# Patient Record
Sex: Female | Born: 1975 | Race: White | Hispanic: No | Marital: Single | Smoking: Former smoker
Health system: Southern US, Community
[De-identification: ages and names within clinical notes are randomized; demographics above are authoritative.]

## PROBLEM LIST (undated history)

## (undated) DIAGNOSIS — Z0189 Encounter for other specified special examinations: Secondary | ICD-10-CM

## (undated) DIAGNOSIS — D649 Anemia, unspecified: Secondary | ICD-10-CM

## (undated) DIAGNOSIS — R0902 Hypoxemia: Secondary | ICD-10-CM

## (undated) DIAGNOSIS — F329 Major depressive disorder, single episode, unspecified: Secondary | ICD-10-CM

## (undated) DIAGNOSIS — J449 Chronic obstructive pulmonary disease, unspecified: Secondary | ICD-10-CM

## (undated) DIAGNOSIS — Z789 Other specified health status: Secondary | ICD-10-CM

## (undated) DIAGNOSIS — Z720 Tobacco use: Secondary | ICD-10-CM

## (undated) DIAGNOSIS — F1010 Alcohol abuse, uncomplicated: Secondary | ICD-10-CM

## (undated) DIAGNOSIS — F32A Depression, unspecified: Secondary | ICD-10-CM

## (undated) HISTORY — PX: BACK SURGERY: SHX140

## (undated) HISTORY — PX: DILATION AND CURETTAGE OF UTERUS: SHX78

## (undated) HISTORY — PX: ANKLE GANGLION CYST EXCISION: SHX1148

---

## 1997-10-06 ENCOUNTER — Emergency Department (HOSPITAL_COMMUNITY): Admission: EM | Admit: 1997-10-06 | Discharge: 1997-10-06 | Payer: Self-pay | Admitting: Emergency Medicine

## 1998-06-13 ENCOUNTER — Encounter: Payer: Self-pay | Admitting: Emergency Medicine

## 1998-06-13 ENCOUNTER — Inpatient Hospital Stay (HOSPITAL_COMMUNITY): Admission: EM | Admit: 1998-06-13 | Discharge: 1998-06-14 | Payer: Self-pay | Admitting: Emergency Medicine

## 1998-11-10 ENCOUNTER — Emergency Department (HOSPITAL_COMMUNITY): Admission: EM | Admit: 1998-11-10 | Discharge: 1998-11-11 | Payer: Self-pay

## 1998-11-11 ENCOUNTER — Inpatient Hospital Stay (HOSPITAL_COMMUNITY): Admission: EM | Admit: 1998-11-11 | Discharge: 1998-11-13 | Payer: Self-pay | Admitting: Emergency Medicine

## 1999-01-14 ENCOUNTER — Encounter: Payer: Self-pay | Admitting: Neurosurgery

## 1999-01-14 ENCOUNTER — Encounter: Payer: Self-pay | Admitting: *Deleted

## 1999-01-14 ENCOUNTER — Inpatient Hospital Stay (HOSPITAL_COMMUNITY): Admission: EM | Admit: 1999-01-14 | Discharge: 1999-01-15 | Payer: Self-pay | Admitting: Neurosurgery

## 1999-06-09 ENCOUNTER — Inpatient Hospital Stay (HOSPITAL_COMMUNITY): Admission: AD | Admit: 1999-06-09 | Discharge: 1999-06-11 | Payer: Self-pay | Admitting: Family Medicine

## 1999-06-10 ENCOUNTER — Encounter: Payer: Self-pay | Admitting: Internal Medicine

## 1999-08-18 ENCOUNTER — Emergency Department (HOSPITAL_COMMUNITY): Admission: EM | Admit: 1999-08-18 | Discharge: 1999-08-18 | Payer: Self-pay | Admitting: Emergency Medicine

## 1999-11-27 ENCOUNTER — Emergency Department (HOSPITAL_COMMUNITY): Admission: EM | Admit: 1999-11-27 | Discharge: 1999-11-27 | Payer: Self-pay | Admitting: *Deleted

## 2000-02-23 ENCOUNTER — Emergency Department (HOSPITAL_COMMUNITY): Admission: EM | Admit: 2000-02-23 | Discharge: 2000-02-23 | Payer: Self-pay | Admitting: Emergency Medicine

## 2000-03-14 ENCOUNTER — Inpatient Hospital Stay (HOSPITAL_COMMUNITY): Admission: EM | Admit: 2000-03-14 | Discharge: 2000-03-15 | Payer: Self-pay | Admitting: Emergency Medicine

## 2000-05-21 ENCOUNTER — Emergency Department (HOSPITAL_COMMUNITY): Admission: EM | Admit: 2000-05-21 | Discharge: 2000-05-21 | Payer: Self-pay | Admitting: Emergency Medicine

## 2000-05-21 ENCOUNTER — Encounter: Payer: Self-pay | Admitting: Emergency Medicine

## 2000-07-26 ENCOUNTER — Encounter: Payer: Self-pay | Admitting: Emergency Medicine

## 2000-07-26 ENCOUNTER — Emergency Department (HOSPITAL_COMMUNITY): Admission: EM | Admit: 2000-07-26 | Discharge: 2000-07-26 | Payer: Self-pay | Admitting: Emergency Medicine

## 2000-08-22 ENCOUNTER — Encounter: Payer: Self-pay | Admitting: Emergency Medicine

## 2000-08-22 ENCOUNTER — Emergency Department (HOSPITAL_COMMUNITY): Admission: EM | Admit: 2000-08-22 | Discharge: 2000-08-22 | Payer: Self-pay | Admitting: *Deleted

## 2000-09-25 ENCOUNTER — Encounter: Payer: Self-pay | Admitting: Emergency Medicine

## 2000-09-25 ENCOUNTER — Inpatient Hospital Stay (HOSPITAL_COMMUNITY): Admission: EM | Admit: 2000-09-25 | Discharge: 2000-09-26 | Payer: Self-pay

## 2000-10-23 ENCOUNTER — Encounter: Admission: RE | Admit: 2000-10-23 | Discharge: 2000-10-23 | Payer: Self-pay | Admitting: Family Medicine

## 2001-02-13 ENCOUNTER — Emergency Department (HOSPITAL_COMMUNITY): Admission: EM | Admit: 2001-02-13 | Discharge: 2001-02-13 | Payer: Self-pay | Admitting: Emergency Medicine

## 2001-07-09 ENCOUNTER — Emergency Department (HOSPITAL_COMMUNITY): Admission: EM | Admit: 2001-07-09 | Discharge: 2001-07-09 | Payer: Self-pay | Admitting: Emergency Medicine

## 2002-04-03 ENCOUNTER — Inpatient Hospital Stay (HOSPITAL_COMMUNITY): Admission: AD | Admit: 2002-04-03 | Discharge: 2002-04-03 | Payer: Self-pay | Admitting: *Deleted

## 2002-06-01 ENCOUNTER — Emergency Department (HOSPITAL_COMMUNITY): Admission: EM | Admit: 2002-06-01 | Discharge: 2002-06-01 | Payer: Self-pay | Admitting: Emergency Medicine

## 2002-10-21 ENCOUNTER — Encounter: Payer: Self-pay | Admitting: Emergency Medicine

## 2002-10-21 ENCOUNTER — Emergency Department (HOSPITAL_COMMUNITY): Admission: EM | Admit: 2002-10-21 | Discharge: 2002-10-21 | Payer: Self-pay | Admitting: Emergency Medicine

## 2002-10-24 ENCOUNTER — Inpatient Hospital Stay (HOSPITAL_COMMUNITY): Admission: EM | Admit: 2002-10-24 | Discharge: 2002-10-25 | Payer: Self-pay | Admitting: Emergency Medicine

## 2003-01-09 ENCOUNTER — Ambulatory Visit (HOSPITAL_COMMUNITY): Admission: AD | Admit: 2003-01-09 | Discharge: 2003-01-09 | Payer: Self-pay | Admitting: Obstetrics & Gynecology

## 2003-01-09 ENCOUNTER — Encounter (INDEPENDENT_AMBULATORY_CARE_PROVIDER_SITE_OTHER): Payer: Self-pay

## 2003-01-13 ENCOUNTER — Other Ambulatory Visit: Admission: RE | Admit: 2003-01-13 | Discharge: 2003-01-13 | Payer: Self-pay | Admitting: Obstetrics and Gynecology

## 2003-02-22 ENCOUNTER — Emergency Department (HOSPITAL_COMMUNITY): Admission: EM | Admit: 2003-02-22 | Discharge: 2003-02-22 | Payer: Self-pay | Admitting: Emergency Medicine

## 2003-03-27 ENCOUNTER — Inpatient Hospital Stay (HOSPITAL_COMMUNITY): Admission: EM | Admit: 2003-03-27 | Discharge: 2003-03-30 | Payer: Self-pay | Admitting: Internal Medicine

## 2003-12-18 ENCOUNTER — Emergency Department (HOSPITAL_COMMUNITY): Admission: EM | Admit: 2003-12-18 | Discharge: 2003-12-18 | Payer: Self-pay | Admitting: Emergency Medicine

## 2004-01-22 ENCOUNTER — Emergency Department (HOSPITAL_COMMUNITY): Admission: EM | Admit: 2004-01-22 | Discharge: 2004-01-22 | Payer: Self-pay | Admitting: Emergency Medicine

## 2004-03-22 ENCOUNTER — Emergency Department (HOSPITAL_COMMUNITY): Admission: EM | Admit: 2004-03-22 | Discharge: 2004-03-22 | Payer: Self-pay | Admitting: Emergency Medicine

## 2004-04-21 ENCOUNTER — Inpatient Hospital Stay (HOSPITAL_COMMUNITY): Admission: EM | Admit: 2004-04-21 | Discharge: 2004-04-23 | Payer: Self-pay | Admitting: *Deleted

## 2004-09-15 ENCOUNTER — Observation Stay (HOSPITAL_COMMUNITY): Admission: EM | Admit: 2004-09-15 | Discharge: 2004-09-16 | Payer: Self-pay | Admitting: Emergency Medicine

## 2004-11-24 ENCOUNTER — Emergency Department (HOSPITAL_COMMUNITY): Admission: EM | Admit: 2004-11-24 | Discharge: 2004-11-24 | Payer: Self-pay | Admitting: Emergency Medicine

## 2004-12-01 ENCOUNTER — Emergency Department (HOSPITAL_COMMUNITY): Admission: EM | Admit: 2004-12-01 | Discharge: 2004-12-02 | Payer: Self-pay | Admitting: Emergency Medicine

## 2004-12-05 ENCOUNTER — Ambulatory Visit (HOSPITAL_COMMUNITY): Admission: RE | Admit: 2004-12-05 | Discharge: 2004-12-05 | Payer: Self-pay | Admitting: Family Medicine

## 2005-06-07 ENCOUNTER — Emergency Department (HOSPITAL_COMMUNITY): Admission: EM | Admit: 2005-06-07 | Discharge: 2005-06-07 | Payer: Self-pay | Admitting: Emergency Medicine

## 2005-06-28 ENCOUNTER — Emergency Department (HOSPITAL_COMMUNITY): Admission: EM | Admit: 2005-06-28 | Discharge: 2005-06-28 | Payer: Self-pay | Admitting: Family Medicine

## 2005-06-29 ENCOUNTER — Observation Stay (HOSPITAL_COMMUNITY): Admission: EM | Admit: 2005-06-29 | Discharge: 2005-06-30 | Payer: Self-pay | Admitting: Emergency Medicine

## 2005-06-29 ENCOUNTER — Ambulatory Visit: Payer: Self-pay | Admitting: Orthopedic Surgery

## 2005-07-04 ENCOUNTER — Encounter: Admission: RE | Admit: 2005-07-04 | Discharge: 2005-07-04 | Payer: Self-pay | Admitting: Family Medicine

## 2005-09-28 ENCOUNTER — Emergency Department (HOSPITAL_COMMUNITY): Admission: EM | Admit: 2005-09-28 | Discharge: 2005-09-28 | Payer: Self-pay | Admitting: Family Medicine

## 2006-03-18 ENCOUNTER — Emergency Department (HOSPITAL_COMMUNITY): Admission: EM | Admit: 2006-03-18 | Discharge: 2006-03-18 | Payer: Self-pay | Admitting: Emergency Medicine

## 2006-05-15 ENCOUNTER — Emergency Department (HOSPITAL_COMMUNITY): Admission: EM | Admit: 2006-05-15 | Discharge: 2006-05-15 | Payer: Self-pay | Admitting: Emergency Medicine

## 2006-12-03 ENCOUNTER — Emergency Department (HOSPITAL_COMMUNITY): Admission: EM | Admit: 2006-12-03 | Discharge: 2006-12-03 | Payer: Self-pay | Admitting: Emergency Medicine

## 2006-12-16 ENCOUNTER — Inpatient Hospital Stay (HOSPITAL_COMMUNITY): Admission: EM | Admit: 2006-12-16 | Discharge: 2006-12-19 | Payer: Self-pay | Admitting: Emergency Medicine

## 2007-01-18 IMAGING — CR DG CHEST 2V
2 series · 2 of 2 positions shown · non-contrast
Comparison: none

CLINICAL DATA: Cough and congestion

Chest 2 view:
Comparison 09/15/2004. Some increase in interstitial opacities throughout both
lower lobes and perihilar regions, with sparing of the upper lobes. No effusion.
Heart size remains normal. Visualized bones unremarkable.

[view not recorded (1 of 2)]
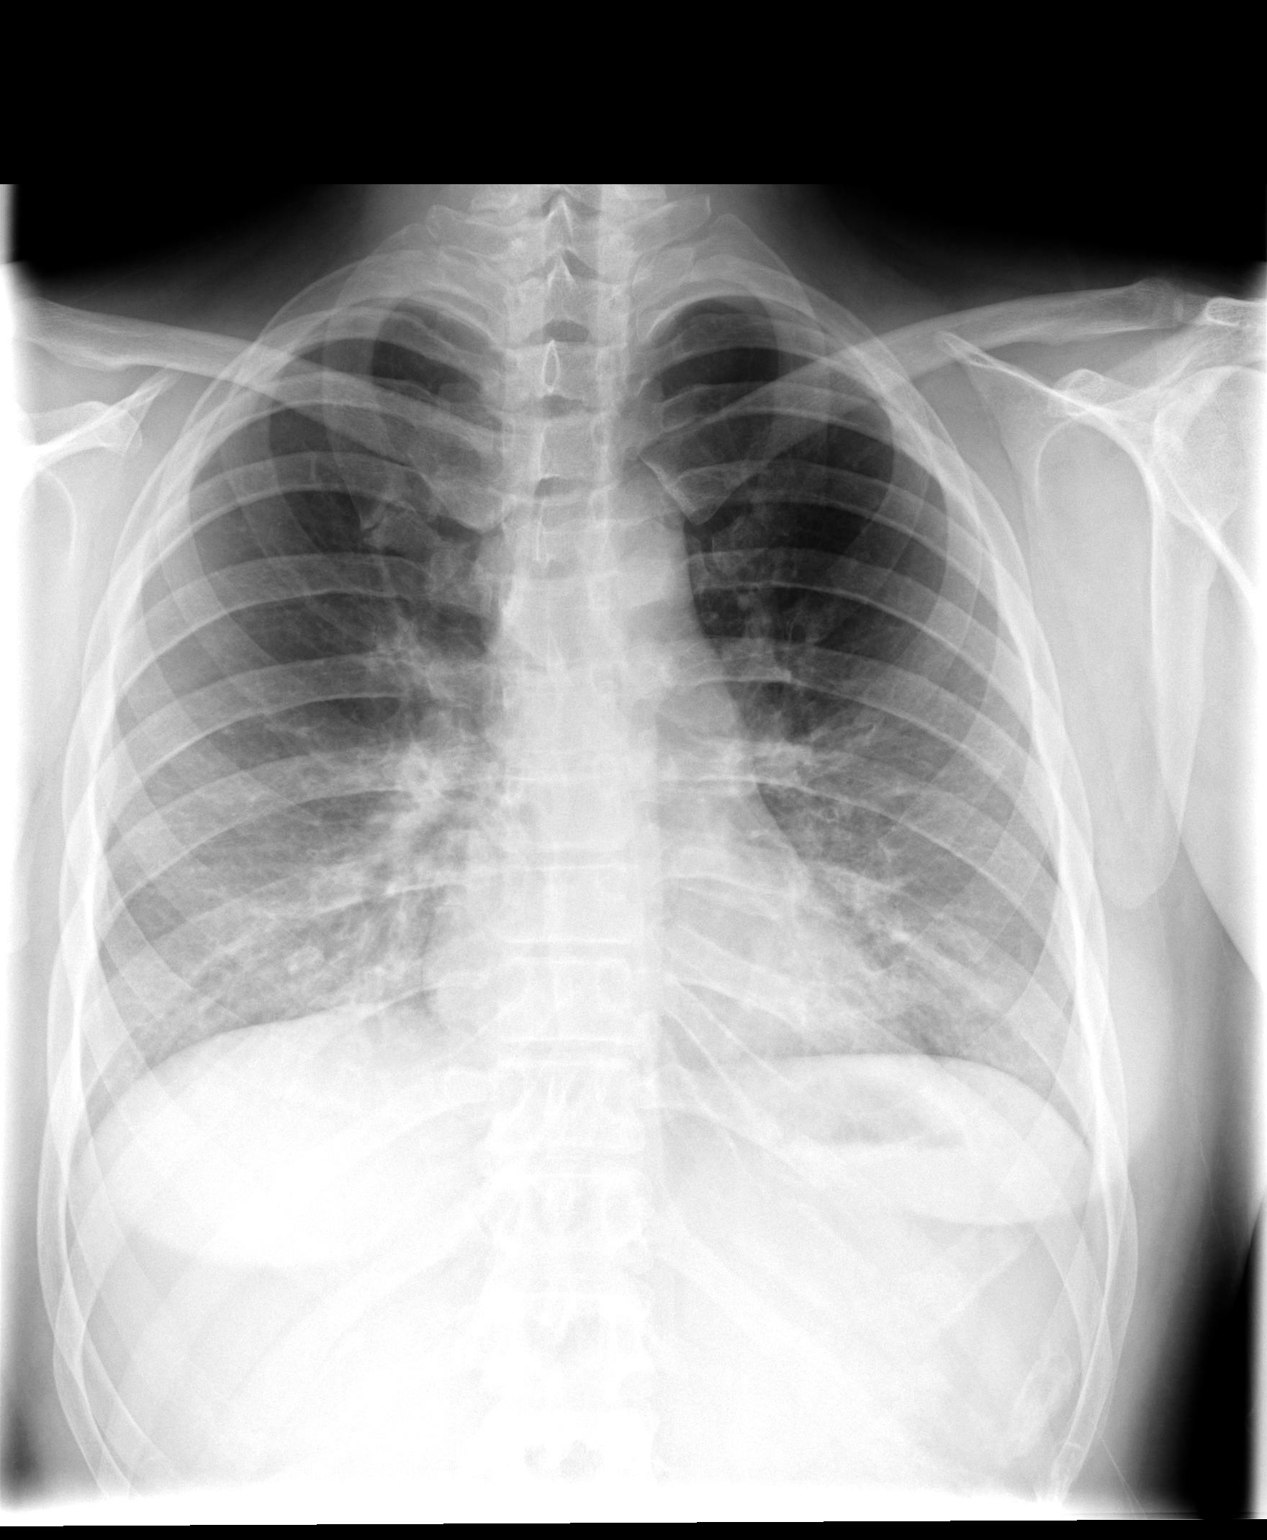

[view not recorded (2 of 2)]
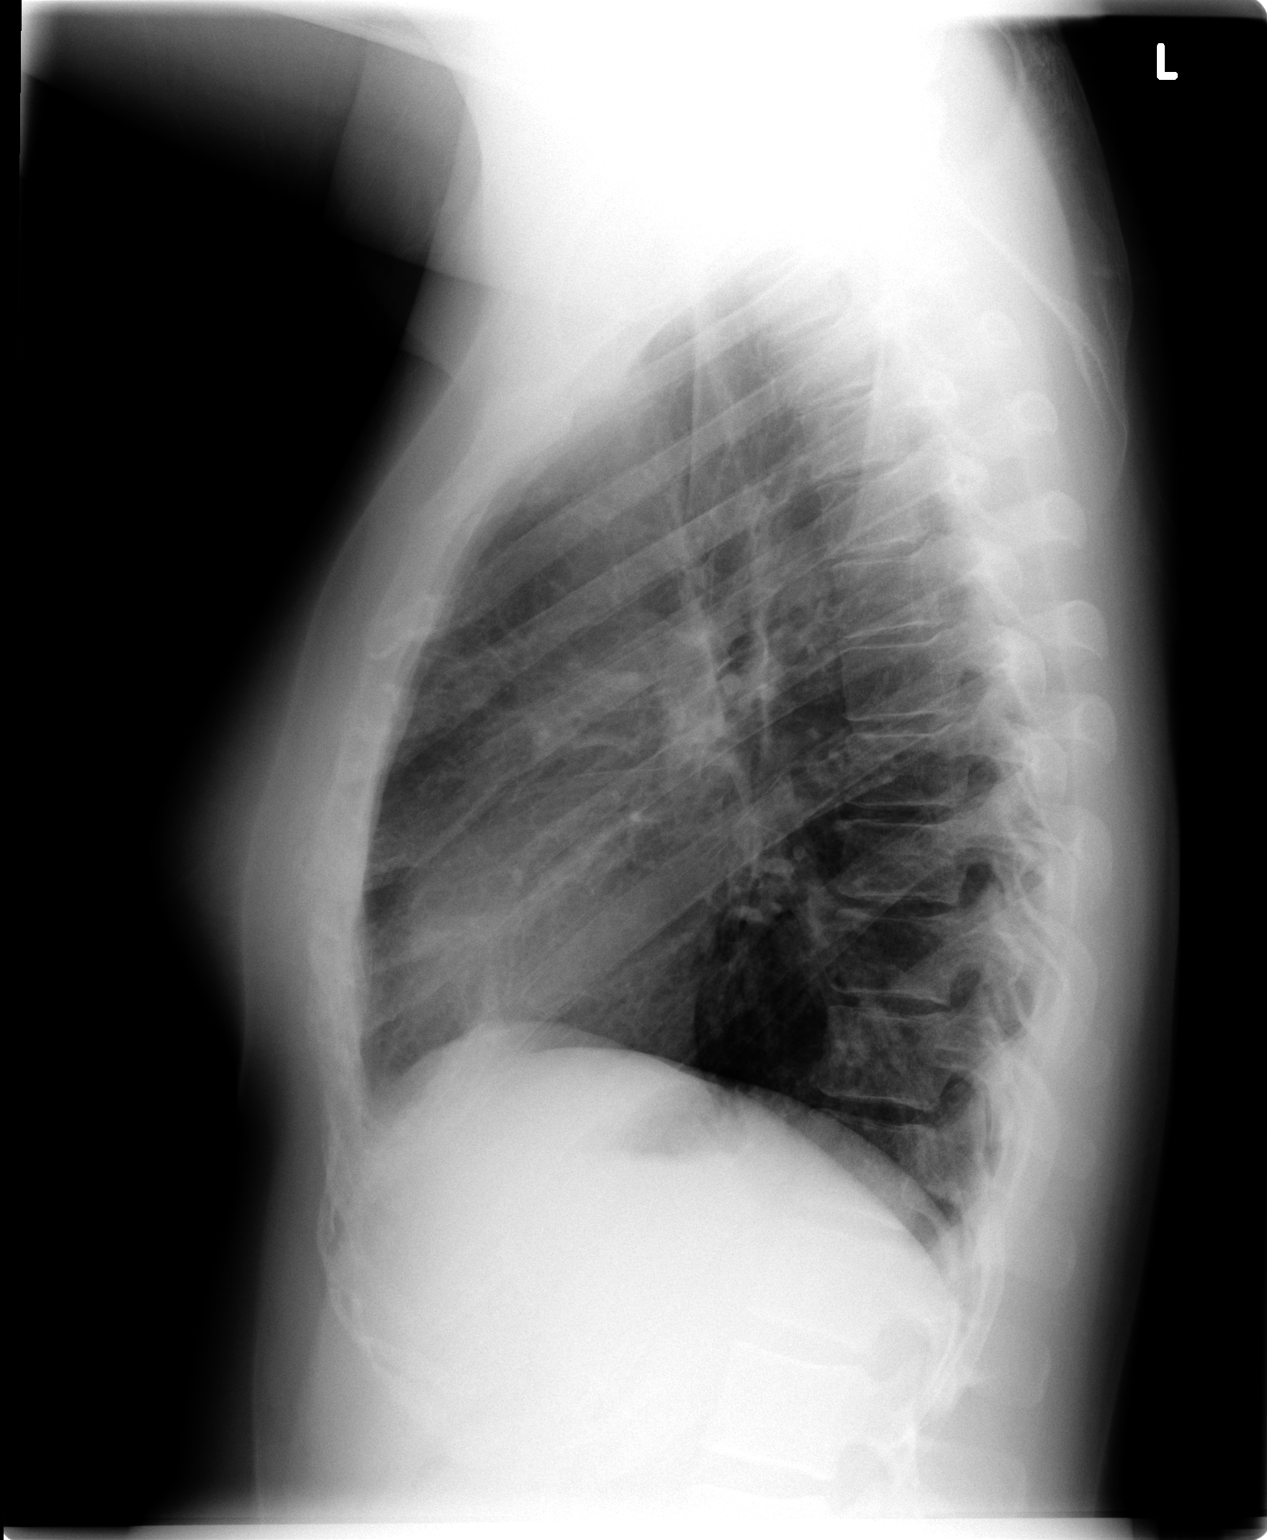

[2 of 2 positions shown; findings below may reference images not displayed]

IMPRESSION: 1. Interval worsening of perihilar and bibasilar infiltrates or atelectasis

## 2007-01-21 ENCOUNTER — Emergency Department (HOSPITAL_COMMUNITY): Admission: EM | Admit: 2007-01-21 | Discharge: 2007-01-21 | Payer: Self-pay | Admitting: Emergency Medicine

## 2007-04-11 ENCOUNTER — Emergency Department (HOSPITAL_COMMUNITY): Admission: EM | Admit: 2007-04-11 | Discharge: 2007-04-12 | Payer: Self-pay | Admitting: Emergency Medicine

## 2007-05-10 ENCOUNTER — Emergency Department (HOSPITAL_COMMUNITY): Admission: EM | Admit: 2007-05-10 | Discharge: 2007-05-10 | Payer: Self-pay | Admitting: Emergency Medicine

## 2007-05-27 ENCOUNTER — Inpatient Hospital Stay (HOSPITAL_COMMUNITY): Admission: EM | Admit: 2007-05-27 | Discharge: 2007-05-30 | Payer: Self-pay | Admitting: Emergency Medicine

## 2007-11-22 ENCOUNTER — Emergency Department (HOSPITAL_COMMUNITY): Admission: EM | Admit: 2007-11-22 | Discharge: 2007-11-22 | Payer: Self-pay | Admitting: Family Medicine

## 2007-11-24 ENCOUNTER — Emergency Department (HOSPITAL_COMMUNITY): Admission: EM | Admit: 2007-11-24 | Discharge: 2007-11-25 | Payer: Self-pay | Admitting: Emergency Medicine

## 2008-11-16 ENCOUNTER — Emergency Department (HOSPITAL_COMMUNITY): Admission: EM | Admit: 2008-11-16 | Discharge: 2008-11-16 | Payer: Self-pay | Admitting: Emergency Medicine

## 2009-02-22 ENCOUNTER — Emergency Department (HOSPITAL_COMMUNITY): Admission: EM | Admit: 2009-02-22 | Discharge: 2009-02-22 | Payer: Self-pay | Admitting: Emergency Medicine

## 2009-05-11 ENCOUNTER — Emergency Department (HOSPITAL_COMMUNITY): Admission: EM | Admit: 2009-05-11 | Discharge: 2009-05-11 | Payer: Self-pay | Admitting: Emergency Medicine

## 2009-07-10 ENCOUNTER — Inpatient Hospital Stay (HOSPITAL_COMMUNITY): Admission: EM | Admit: 2009-07-10 | Discharge: 2009-07-12 | Payer: Self-pay | Admitting: Emergency Medicine

## 2009-11-01 ENCOUNTER — Emergency Department (HOSPITAL_COMMUNITY): Admission: EM | Admit: 2009-11-01 | Discharge: 2009-11-01 | Payer: Self-pay | Admitting: Emergency Medicine

## 2009-11-25 ENCOUNTER — Emergency Department (HOSPITAL_COMMUNITY): Admission: EM | Admit: 2009-11-25 | Discharge: 2009-11-25 | Payer: Self-pay | Admitting: Emergency Medicine

## 2010-01-07 ENCOUNTER — Inpatient Hospital Stay (HOSPITAL_COMMUNITY): Admission: EM | Admit: 2010-01-07 | Discharge: 2010-01-09 | Payer: Self-pay | Admitting: Emergency Medicine

## 2010-01-20 ENCOUNTER — Emergency Department (HOSPITAL_COMMUNITY): Admission: EM | Admit: 2010-01-20 | Discharge: 2009-06-24 | Payer: Self-pay | Admitting: Emergency Medicine

## 2010-03-06 ENCOUNTER — Encounter: Payer: Self-pay | Admitting: Family Medicine

## 2010-04-26 LAB — DIFFERENTIAL
Basophils Absolute: 0 10*3/uL (ref 0.0–0.1)
Basophils Absolute: 0 10*3/uL (ref 0.0–0.1)
Basophils Relative: 0 % (ref 0–1)
Eosinophils Relative: 4 % (ref 0–5)
Lymphocytes Relative: 21 % (ref 12–46)
Lymphs Abs: 1.7 10*3/uL (ref 0.7–4.0)
Monocytes Absolute: 0.3 10*3/uL (ref 0.1–1.0)
Monocytes Absolute: 0.8 10*3/uL (ref 0.1–1.0)

## 2010-04-26 LAB — CBC
HCT: 32.9 % — ABNORMAL LOW (ref 36.0–46.0)
MCH: 24.5 pg — ABNORMAL LOW (ref 26.0–34.0)
MCHC: 30.8 g/dL (ref 30.0–36.0)
MCV: 77.6 fL — ABNORMAL LOW (ref 78.0–100.0)
Platelets: 253 10*3/uL (ref 150–400)
RBC: 4.07 MIL/uL (ref 3.87–5.11)
WBC: 8 10*3/uL (ref 4.0–10.5)

## 2010-04-26 LAB — ETHANOL: Alcohol, Ethyl (B): <5 mg/dL (ref 0–10)

## 2010-04-26 LAB — RAPID URINE DRUG SCREEN, HOSP PERFORMED
Amphetamines: NOT DETECTED
Barbiturates: NOT DETECTED
Benzodiazepines: NOT DETECTED
Opiates: POSITIVE — AB
Tetrahydrocannabinol: NOT DETECTED

## 2010-04-26 LAB — CULTURE, BLOOD (ROUTINE X 2)
Culture: NO GROWTH
Culture: NO GROWTH

## 2010-04-26 LAB — BASIC METABOLIC PANEL
CO2: 22 mEq/L (ref 19–32)
CO2: 23 mEq/L (ref 19–32)
Calcium: 9 mg/dL (ref 8.4–10.5)
Chloride: 109 mEq/L (ref 96–112)
GFR calc Af Amer: >60 mL/min (ref >60)
Glucose, Bld: 142 mg/dL — ABNORMAL HIGH (ref 70–99)
Sodium: 137 mEq/L (ref 135–145)

## 2010-04-26 LAB — BLOOD GAS, ARTERIAL
O2 Content: 2 L/min
O2 Saturation: 96.2 %
pO2, Arterial: 75.8 mmHg — ABNORMAL LOW (ref 80.0–100.0)

## 2010-04-26 LAB — LACTIC ACID, PLASMA: Lactic Acid, Venous: 2.2 mmol/L (ref 0.5–2.2)

## 2010-04-30 LAB — CBC
HCT: 34.7 % — ABNORMAL LOW (ref 36.0–46.0)
Hemoglobin: 11.4 g/dL — ABNORMAL LOW (ref 12.0–15.0)
MCV: 79.4 fL (ref 78.0–100.0)
Platelets: 288 10*3/uL (ref 150–400)
RDW: 16.2 % — ABNORMAL HIGH (ref 11.5–15.5)
WBC: 7.8 10*3/uL (ref 4.0–10.5)

## 2010-04-30 LAB — BASIC METABOLIC PANEL
BUN: 8 mg/dL (ref 6–23)
Chloride: 104 mEq/L (ref 96–112)
Creatinine, Ser: 0.68 mg/dL (ref 0.4–1.2)
GFR calc Af Amer: >60 mL/min (ref >60)
Glucose, Bld: 90 mg/dL (ref 70–99)
Potassium: 3.9 mEq/L (ref 3.5–5.1)
Sodium: 136 mEq/L (ref 135–145)

## 2010-04-30 LAB — ETHANOL: Alcohol, Ethyl (B): <5 mg/dL (ref 0–10)

## 2010-04-30 LAB — DIFFERENTIAL
Eosinophils Absolute: 0.2 10*3/uL (ref 0.0–0.7)
Eosinophils Relative: 3 % (ref 0–5)
Lymphs Abs: 3 10*3/uL (ref 0.7–4.0)
Monocytes Absolute: 0.6 10*3/uL (ref 0.1–1.0)

## 2010-04-30 LAB — RAPID URINE DRUG SCREEN, HOSP PERFORMED
Amphetamines: NOT DETECTED
Barbiturates: NOT DETECTED
Benzodiazepines: NOT DETECTED
Tetrahydrocannabinol: POSITIVE — AB

## 2010-04-30 LAB — POCT PREGNANCY, URINE: Preg Test, Ur: NEGATIVE

## 2010-05-02 LAB — IRON AND TIBC
Iron: 12 ug/dL — ABNORMAL LOW (ref 42–135)
Saturation Ratios: 3 % — ABNORMAL LOW (ref 20–55)
TIBC: 458 ug/dL (ref 250–470)

## 2010-05-02 LAB — RAPID URINE DRUG SCREEN, HOSP PERFORMED
Benzodiazepines: NOT DETECTED
Cocaine: NOT DETECTED
Opiates: NOT DETECTED
Tetrahydrocannabinol: NOT DETECTED

## 2010-05-02 LAB — POCT I-STAT, CHEM 8
BUN: 9 mg/dL (ref 6–23)
Chloride: 110 mEq/L (ref 96–112)
Creatinine, Ser: 0.6 mg/dL (ref 0.4–1.2)
Glucose, Bld: 89 mg/dL (ref 70–99)
Hemoglobin: 12.2 g/dL (ref 12.0–15.0)
Potassium: 3.9 mEq/L (ref 3.5–5.1)
Sodium: 142 mEq/L (ref 135–145)

## 2010-05-02 LAB — RETICULOCYTES: Retic Count, Absolute: 65.7 10*3/uL (ref 19.0–186.0)

## 2010-05-02 LAB — CBC
MCHC: 32.5 g/dL (ref 30.0–36.0)
MCV: 77 fL — ABNORMAL LOW (ref 78.0–100.0)
MCV: 77.1 fL — ABNORMAL LOW (ref 78.0–100.0)
Platelets: 228 10*3/uL (ref 150–400)
Platelets: 261 10*3/uL (ref 150–400)
RBC: 4.35 MIL/uL (ref 3.87–5.11)
WBC: 10.9 10*3/uL — ABNORMAL HIGH (ref 4.0–10.5)
WBC: 5.8 10*3/uL (ref 4.0–10.5)

## 2010-05-02 LAB — FERRITIN: Ferritin: 2 ng/mL — ABNORMAL LOW (ref 10–291)

## 2010-05-02 LAB — BASIC METABOLIC PANEL
BUN: 10 mg/dL (ref 6–23)
CO2: 24 mEq/L (ref 19–32)
Calcium: 8.9 mg/dL (ref 8.4–10.5)
Chloride: 114 mEq/L — ABNORMAL HIGH (ref 96–112)
Creatinine, Ser: 0.73 mg/dL (ref 0.4–1.2)
GFR calc Af Amer: >60 mL/min (ref >60)

## 2010-05-02 LAB — DIFFERENTIAL
Lymphocytes Relative: 19 % (ref 12–46)
Lymphs Abs: 1.1 10*3/uL (ref 0.7–4.0)
Monocytes Relative: 3 % (ref 3–12)
Neutro Abs: 4.4 10*3/uL (ref 1.7–7.7)
Neutrophils Relative %: 76 % (ref 43–77)

## 2010-05-02 LAB — FOLATE: Folate: 15.4 ng/mL

## 2010-06-28 NOTE — H&P (Signed)
[name], [name]          ACCOUNT NO.:  [id number]   MEDICAL RECORD NO.:  [id number]          PATIENT TYPE:  INP   LOCATION:  A321                          FACILITY:  [facility]   PHYSICIAN:  Mila Homer. Sudie Bailey, M.D.DATE OF BIRTH:  [birth year]   DATE OF ADMISSION:  05/27/2007  DATE OF DISCHARGE:  LH                              HISTORY & PHYSICAL   This 35 year old presented to the emergency room short of breath, which  had started at about 2:30 this morning.  She also noted she had been  more short of breath the last 3 months.   She has had asthma since age 48, but 3 months ago she noted there was a  stopped up kitchen sink at home and she poured a quarter cup of bleach  in there, but after 2 hours when nothing had drained, she tried to open  this up with a snake and that did not work, put in a cup what is called  Cor (apparently with sodium hydroxyzine).  Immediately, she said there  was a mist that emanated from the sink and she smelled it and she said  it smelled terrible and she got more short of breath.  She had to go  outside of the house and take a couple of puffs of her albuterol  inhaler, but when that did not work, had to come inside, went to her  room, closed the door, and took a nebulized breathing treatment with  albuterol.  She still was so short of breath when the family returned,  they had an ambulance take her to the emergency room.   She is not a smoker.  She works at [employer] and has no smoke exposure  there.  Generally, she does well on Singulair 10 mg daily and Advair  500/50 one puff b.i.d. and rarely has to use a rescue inhaler.  She is  on Prozac 20 mg daily.   Reviewed her medical record from the [facility], and she has been in  the emergency room numerous times, several in 2007 at least 3 to 4 in  2008 for flare ups of asthma or pneumonia, and then now 3 times already  in 2009.   She denies headache or any neurological symptoms.  Denies GI or GU  symptomatology.  She does have a little tightness in the chest related  to her asthma, this is typical for asthmatic problems for her, and she  has had no chest pain, palpitations so forth.   On admission, her temperature is 96.7, pulse 103, respiratory rate 20,  and blood pressure 110/59.  She is a well-developed, well-nourished  young woman, who is in no acute distress at the time I examined her.  Even though when I auscultated her chest, I could hear expiratory  rhonchi throughout the chest.  She is moving air well.  No intercostal  retractions.  No use of accessory muscles for respiration.  There is no  axillary or supraclavicular adenopathy.  No anterior cervical  adenopathy.  Skin turgor is normal.  Mucous membranes are moist.  The  abdomen was soft without organomegaly or mass.  There was no edema of  the ankles.   White cell count today was 5100, H&H 10.9 and 37.0, and MCV of 76.  MET-  7 showed a glucose of 126.  O2 sat was 95%.   Chest x-ray was suggestive of lingular pneumonia.   ADMISSION DIAGNOSES:  1. Pneumonia.  2. Reactive asthma.  3. Major depression, controlled.   Given the uncontrolled asthma, we are admitting her to hospital to be on  neb treatments with Xopenex and putting her on Solu-Medrol 125 mg IV q.8  hours.  She will also be on Rocephin 1 g IV q.24 hours and Zithromax 500  mg by mouth daily.   I have asked to get a lung CT given the significant change in her status  and also asked them to use a pulmonary emboli protocol just in case this  is not just asthma.  Further asked for pulmonary function tests, ABGs,  and full review of her history by Dr. Kari Baars, pulmonologist.   Discussed all of this with the patient and her family including her  mother.   She will be on Prozac 20 mg daily.      Mila Homer. Sudie Bailey, [name]  Electronically Signed     SDK/MEDQ  D:  05/27/2007  T:  05/28/2007  Job:  [id number]

## 2010-06-28 NOTE — Group Therapy Note (Signed)
[name], [name]          ACCOUNT NO.:  [id number]   MEDICAL RECORD NO.:  [id number]          PATIENT TYPE:  INP   LOCATION:  A321                          FACILITY:  [facility]   PHYSICIAN:  Edward L. Juanetta Gosling, M.D.DATE OF BIRTH:  1975/11/18   DATE OF PROCEDURE:  05/29/2007  DATE OF DISCHARGE:                                 PROGRESS NOTE   PROBLEMS:  1. Asthma.  2. Probable chronic obstructive pulmonary disease.   SUBJECTIVE:  [name] says she is a little better.  She is not as  wheezy today and not as short of breath.   PHYSICAL EXAMINATION:  Temperature of 98.3, pulse 68, respirations 20,  blood pressure 92/64, O2 sats 93% on room air.  She still has  significant wheezing, but I think less than yesterday.   Her alpha-1 antitrypsin level is pending, and her PFTs are pending.   ASSESSMENT:  She is slowly improving.  I suspect that she has some  chronic obstructive pulmonary disease as well as a strictly asthma  diagnosis.  She has been off her Advair which is a part of the problem.  She has already been restarted here in the hospital, and my plan then  would be to continue with her treatments, try to get the alpha-1  antitrypsin, and a PFT and follow.      Edward L. Juanetta Gosling, [name]  Electronically Signed     ELH/MEDQ  D:  05/29/2007  T:  05/29/2007  Job:  [id number]

## 2010-06-28 NOTE — Group Therapy Note (Signed)
NAMEPOLETTE, NOFSINGER          ACCOUNT NO.:  [id number]   MEDICAL RECORD NO.:  [id number]          PATIENT TYPE:  INP   LOCATION:  A304                          FACILITY:  [facility]   PHYSICIAN:  Mila Homer. Sudie Bailey, M.D.DATE OF BIRTH:  1975/11/25   DATE OF PROCEDURE:  12/18/2006  DATE OF DISCHARGE:                                 PROGRESS NOTE   SUBJECTIVE:  She feels a lot better.  She had nausea last night and had  problems sleeping as well as sleeping this morning.  She had cut way  back on her cigarettes and really has not smoked much in the last week,  although before then she had been smoking fairly heavily.  Yesterday,  she noted nausea after a Nicoderm 21 mg patch was put on.   OBJECTIVE:  She is oriented, alert once she wakes up.  She is well-  developed, well-nourished.  No acute distress.  Temperature 98.1, pulse  75, respiratory rate 20, blood pressure 99/50.  She is breathing well, moving air well, but her lungs show diffuse  inspiratory and expiratory wheezing.  No intercostal retractions.  No  use accessory muscles of respiration.  HEART:  A regular rhythm, rate of 70.  ABDOMEN:  Benign.  There is no edema of the ankles.   O2 sat is 96% on 30% Venti mask.   Yesterday's white cell count was 4000 with 86 neutrophils, 14 lymphs.  H&H 11.1, 33.4, D-dimer 0.22.   She has a very loose cough today actually coughed up some thick circular  plugs roughly a centimeter in diameter of tenacious brown sputum.   ASSESSMENT:  1. Pneumonia.  2. Probable chronic obstructive pulmonary disease.  3. Tobacco use disorder.  4. Nausea, probably secondary too much nicotine.   PLAN:  Continue IV fluids.  Continue with Solu-Medrol on 125 mg IV q.6  h.  Up in chair.  Sputum for gram stain, C&S and will change Nicoderm  patch to 7 mg daily.      Mila Homer. Sudie Bailey, [name]  Electronically Signed     SDK/MEDQ  D:  12/18/2006  T:  12/18/2006  Job:  [id number]

## 2010-06-28 NOTE — Discharge Summary (Signed)
[name], [name]          ACCOUNT NO.:  [id number]   MEDICAL RECORD NO.:  [id number]          PATIENT TYPE:  INP   LOCATION:  A321                          FACILITY:  [facility]   PHYSICIAN:  Mila Homer. Sudie Bailey, M.D.DATE OF BIRTH:  [birth year]   DATE OF ADMISSION:  05/27/2007  DATE OF DISCHARGE:  04/16/2009LH                               DISCHARGE SUMMARY   This 35 year old was admitted to the hospital with COPD exacerbation.  She had benign 4-day hospitalization extending from May 27, 2007, to  May 30, 2007.  Vital signs remained stable.   Her admission white cell count is 5100, H&H 10.9 and 32.0, and MCV of  76.  She had a normal diff.  BMP was normal except for a glucose mildly  elevated at 126.  ABGs on room air showed pH 7.40, pCO2 29, pO2 65, and  bicarb 18.  Blood cultures x2 showed no growth at 2 days.   Admission chest x-ray showed airspace opacity suspicious for lingula  versus right middle lobe pneumonia.  Her CT angio of the chest showed  no pulmonary emboli, but did show pulmonary arterial hypertension,  scattered atelectasis, and patchy upper lobe ground glass attenuation.  It was felt that viral atypical pneumonia could not be excluded.   She was admitted to the hospital and started on Solu-Medrol 125 mg IV  q.8 h., normal saline IV 100 mL an hour, given Rocephin 1 g IV q.24 h.  and Zithromax 500 mg p.o. daily.  She was an O2 at 2 L a minute,  continuous pulse ox.   I asked Dr. Juanetta Gosling, pulmonologist, see her in consult.  She was given  Tussionex 1 teaspoon b.i.d.   She gradually improved during the hospitalization.  She continued with  wheezing in the chest up until the day she was discharged, at which time  she was breathing well, moving air well, no intercostal retractions, no  use of accessory muscles for respiration, and good color even off O2.   FINAL DISCHARGE DIAGNOSES:  1. Pneumonia.  2. Chronic obstructive pulmonary disease exacerbation.  3.  Tobacco use disorder with at least a 20-pack-year history of      smoking.  4. History of asthma.  5. Major depression.   She was discharged home on albuterol neb treatment 0.5 mL and 2 mL  sterile saline q.4 h. (20 mL p.r.n. refills a year), prednisone 10 mg  two b.i.d. (30, no refills), and ProAir inhaler 2 puffs q.4 h. with  p.r.n. refills.  She is also to  continue her Prozac 20 mg daily and Singulair 10 mg daily.  We will  arrange Advair 500/50 one puff b.i.d. for using the samples next week  when I see her in followup.   At the time of discharge, alpha-1 antitrypsin level was pending.      Mila Homer. Sudie Bailey, [name]  Electronically Signed     SDK/MEDQ  D:  05/30/2007  T:  05/30/2007  Job:  [id number]

## 2010-06-28 NOTE — H&P (Signed)
[name], [name]          ACCOUNT NO.:  [id number]   MEDICAL RECORD NO.:  [id number]          PATIENT TYPE:  INP   LOCATION:  A304                          FACILITY:  [facility]   PHYSICIAN:  Mila Homer. Sudie Bailey, M.D.DATE OF BIRTH:  [birth year]   DATE OF ADMISSION:  12/16/2006  DATE OF DISCHARGE:  LH                              HISTORY & PHYSICAL   35 year old was seen in the emergency room about 10 days ago and started  on Zithromax for what was felt to be bronchitis.  She seems to be  getting worse.  She was seen in the office 4-5 days ago and was started  on Levaquin instead.  She has gotten progressively worse, however.   The patient has had chest pain and pleuritic pain with this.   She has a long history of asthmatic bronchitis and long history of  cigarette smoking.  She also has had a history of depression and alcohol  abuse.   CURRENT MEDICATIONS:  1. Advair Diskus 500 b.i.d.  2. Albuterol inhalation as needed.  3. Prozac 20 mg daily.  4. Now Levaquin 750 daily.   PHYSICAL EXAMINATION:  VITAL SIGNS:  In the emergency room, her BP was  105/72, pulse 120, respiratory rate 24, temperature 97.5.  Recheck in 3  hours, BP 96/43, pulse 101, respiratory rate 16, temperature 98.1.  GENERAL:  At he time of my exam she was oriented and alert.  No acute  distress.  Still wheezing and short of breath.  Is a good historian.  Sentence structure was intact.  Sensorium appeared normal.  Pharynx  appeared normal.  NECK:  There was no axillary or supraclavicular adenopathy.  No anterior  cervical adenopathy.  LUNGS:  Decreased breath sounds throughout, but there was minimal  wheezing.  She is moving air well without intercostal retraction and  without use of accessory muscles of respiration.  HEART:  Regular rhythm, rate of 80.  ABDOMEN:  Soft without organomegaly or mass.  EXTREMITIES:  There is no edema of the ankles.   LABORATORY DATA:  Admission chest x-ray showed chronic  changes of  asthma with possible superimposed perihilar infiltrates.   Her admission white cell count was 6800 of which 56% neutrophils, 31  lymphs, 9 monos, 4 eosinophils.  Her hemoglobin was 11.5, MCV 79.4.  BNP  was essentially normal.  ABGs on room air showed a pH of 7.43, PCO2 29,  PO2 of 62, bicarb 19.  Blood cultures x2 were drawn, and they are both  at preliminary status with no growth.   ADMISSION DIAGNOSES:  1. Presumptive asthmatic bronchitis.  2. Tobacco use disorder.  3. Depression.   She is being started on Solu-Medrol 25 mg IV q.6h. with Rocephin 1 gram  IV q.24h., Levaquin 500 mg IV q.24h.  We are continuing her on Prozac 20  mg daily and giving her albuterol and Atrovent nebulizer treatments  q.4h. while awake.  D-dimer is pending.      Mila Homer. Sudie Bailey, [name]  Electronically Signed     SDK/MEDQ  D:  12/17/2006  T:  12/17/2006  Job:  [id number]

## 2010-06-28 NOTE — Consult Note (Signed)
[name], [name]          ACCOUNT NO.:  [id number]   MEDICAL RECORD NO.:  [id number]          PATIENT TYPE:  INP   LOCATION:  A329                          FACILITY:  [facility]   PHYSICIAN:  Edward L. Juanetta Gosling, M.D.DATE OF BIRTH:  12/28/1975   DATE OF CONSULTATION:  DATE OF DISCHARGE:                                 CONSULTATION   REASON FOR CONSULTATION:  Pneumonia and asthma.   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  She has had cough and  congestion.  She has been in and out of the emergency room and has had a  lot of difficulty.  She says that her cough has been nonproductive.  She  has not had a lot of fever.  She has a history of asthma for about 18  years.  Her problem seems to have started when she used a lot of  cleaning solutions in a drain and she became short of breath when she  breathed the fumes from this.  She has continued to have trouble since  then.  She had been in and out of the emergency room on several  occasions.   PAST MEDICAL HISTORY:  Positive for asthma.  Positive for depression.   SOCIAL HISTORY:  She smoked up until about 3 months ago, but has managed  to stop.  She does work at [employer].   FAMILY HISTORY:  Multiple family members with COPD and asthma.   PHYSICAL EXAMINATION:  Shows that she is awake and alert.  Blood  pressure 120/70, pulse of about 100, and respirations 18.  She is  wheezing bilaterally.  Her heart is regular without gallop.  Her abdomen  is soft without masses.  Extremities show no edema.  Her mucous  membranes are moist.  Her pupils are reactive.   LAB WORK:  Blood culture scores are pending.  White count is 5100,  hemoglobin 10.9, and platelets 206,000.  BMET normal.  Blood gas, pO2  65, pCO2 of 29, and pH 7.40; this is on room air.  Chest x-ray shows low-  lung volumes and questionable  right middle lobe pneumonia.  CT chest  shows no pulmonary embolus, possible pneumonia, and pulmonary artery  hypertension.   ASSESSMENT:  She has asthma, which is not controlled.  She has perhaps  even some chronic obstructive pulmonary disease.  She certainly has a  very strong positive family history.   PLAN:  My plan then is to have her continue with her treatment with  steroids, inhaled bronchodilators, and antibiotics.  I am going to order  an alpha-1 antitrypsin level because of a strong family history.      Edward L. Juanetta Gosling, [name]  Electronically Signed     ELH/MEDQ  D:  05/28/2007  T:  05/28/2007  Job:  [id number]

## 2010-06-28 NOTE — H&P (Signed)
NAMEMARLIN, BRYS          ACCOUNT NO.:  [id number]   MEDICAL RECORD NO.:  [id number]          PATIENT TYPE:  INP   LOCATION:  A321                          FACILITY:  [facility]   PHYSICIAN:  Mila Homer. Sudie Bailey, M.D.DATE OF BIRTH:  1975-11-22   DATE OF ADMISSION:  05/27/2007  DATE OF DISCHARGE:  LH                              HISTORY & PHYSICAL   A 35 year old woman presented to the emergency room short of breath and  asthmatic, and despite treatments there with Solu-Medrol and constant  Xopenex, was still short of breath, wheezing, and needed admission.   Her history of asthma goes back to age 73.  She does not smoke.  Currently, works at [employer], and there is no smoke exposure.   Apparently about 3 months ago, the patient had a stopped up drain at  home and put in a quater-cup of household bleach.  After 2 hours, the  drain was still stopped up, so she borrowed a snake from work, tried to  snake the obstruction and did not work, and then put in about a cupful  of COR which is a cleaner (question containing sodium hydroxide) from  work.  She said immediately there was a mist coming out of the sink and  what she smelt was terrible, she got terribly short of breath at that  point and left the kitchen immediately, went out the house and took  albuterol by inhaler.  That did not help.  She came back to the house,  went to her room, closed the door, and took a nebulized treatment with  albuterol.  Family, who was present today, said when they came back  home, the patient was so short of breath that they had to call the  ambulance to bring her to the hospital for evaluation in the emergency  room.  The patient said that ever since then, her asthma is really  acting up.  Before this, she will be on Singulair 10 mg daily and on  Advair 500/50 a puff b.i.d. and would rarely need a rescue inhaler, but  since then, she was using this all the time.   Review of the medical record shows  she has had numerous visits to the  [facility] ED including August 2007, February 2008, October 2008,  November 2008, __________  February 2009 and March 2009 with pneumonia.  Most recently __________ today.   Chest x-ray today shows shallow lung volumes.    Dictation ended at this point.      Mila Homer. Sudie Bailey, [name]  Electronically Signed     SDK/MEDQ  D:  05/27/2007  T:  05/28/2007  Job:  [id number]

## 2010-06-28 NOTE — Group Therapy Note (Signed)
[name], [name]          ACCOUNT NO.:  [id number]   MEDICAL RECORD NO.:  [id number]          PATIENT TYPE:  INP   LOCATION:  A321                          FACILITY:  [facility]   PHYSICIAN:  Mila Homer. Sudie Bailey, M.D.DATE OF BIRTH:  [birth year]   DATE OF PROCEDURE:  05/28/2007  DATE OF DISCHARGE:                                 PROGRESS NOTE   SUBJECTIVE:  [name] is feeling better today.  She is breathing better.  The patient noted that she just stopped smoking 2 months ago and started  smoking at 13 in average, and probably a pack a day.  She has smoked for  19 years.   OBJECTIVE:  VITAL SIGNS:  Temperature 97.8, pulse 95, respiratory rate  22, and the blood pressure 108/52.  Color is good on O2.  GENERAL:  She is oriented, alert, in no acute distress, well developed,  well nourished.  LUNGS:  Lung auscultation still show some expiratory wheezing but this  has improved compared to yesterday.  No intercostal retraction or use of  accessory muscles for respiration.  HEART:  Regular rhythm, rate of about 70s.  EXTREMITIES:  There is no edema in the ankles.   ABGs done yesterday on room air showed pH of 7.40, PCO2 29.9, PO2 65.2,  and bicarb 18.  CT of the lung showed no pulmonary emboli and pulmonary  arterial hypertension, scattered atelectasis, patchy upper lobe, ground-  glass attenuation found possibly related to asthma, although viral or  aspiration pneumonia is not excluded.   ASSESSMENT:  1. Probable pneumonia.  2. Reactive asthma.  3. History of tobacco use.  4. Probable chronic obstructive pulmonary disease.   PLAN:  At present, we are going to continue her on current medications  which are:  1. Rocephin 1 gm IV q.24 h.  2. Albuterol 1.25 mg q.4 h. for a week.  3. IV methylprednisolone 125 mg q.8 h.  4. Azithromycin 500 mg IV q.24 h.   She is due to see Dr. Juanetta Gosling today.     Mila Homer. Sudie Bailey, [name]  Electronically Signed    SDK/MEDQ  D:  05/28/2007   T:  05/28/2007  Job:  [id number]

## 2010-06-28 NOTE — Group Therapy Note (Signed)
NAMESHONIA, [name]          ACCOUNT NO.:  [id number]   MEDICAL RECORD NO.:  [id number]          PATIENT TYPE:  INP   LOCATION:  A321                          FACILITY:  [facility]   PHYSICIAN:  Mila Homer. Sudie Bailey, M.D.DATE OF BIRTH:  02-04-1976   DATE OF PROCEDURE:  DATE OF DISCHARGE:                                 PROGRESS NOTE   SUBJECTIVE:  She is still short of breath.   OBJECTIVE:  Temperature 98.2, pulse 94, respiratory rate 20, and blood  pressure 92/49.  She is somewhat recumbent in bed.  Color is good.  She  is oriented and alert, in no acute distress.  Well developed and well  nourished.  Auscultation of lungs still shows expiratory wheezing  throughout, more pronounced at the posterior lungs, but she is moving  air well.  No intercostal retractions.  No use of accessory muscles for  respiration.  Heart has a regular rate and rhythm of 90.  Abdomen is  benign.  O2 sat at 2 L is 94% but on room air, 93%.   </ASSESSMENT/  (1) COPD exacerbation  (2) Tobacco use disorder  (3) Asthma   PLAN:  Discussed with Dr. Kari Baars.  Alpha-1 antitrypsin level is  pending as are the PFTs.  The CT showed no alveolitis which might have  happened if she had had a chlorine gas exposure.  The patient's asthma  has been worsening for 3 months and her stopping her Advair inhaler 2  weeks ago did not help any.  Hopefully, she will be improved tomorrow to  the point she can go home on steroids by mouth, but meanwhile, her other  tests are pending as above.       Mila Homer. Sudie Bailey, [name]  Electronically Signed     SDK/MEDQ  D:  05/29/2007  T:  05/29/2007  Job:  [id number]

## 2010-06-28 NOTE — Group Therapy Note (Signed)
NAMEMARIADELALUZ, GUGGENHEIM          ACCOUNT NO.:  [id number]   MEDICAL RECORD NO.:  [id number]          PATIENT TYPE:  INP   LOCATION:  A321                          FACILITY:  [facility]   PHYSICIAN:  Edward L. Juanetta Gosling, M.D.DATE OF BIRTH:  1975-10-08   DATE OF PROCEDURE:  DATE OF DISCHARGE:  05/30/2007                                 PROGRESS NOTE   Ms. Bry says she feels much better today.  She has no new  complaints.   PHYSICAL EXAMINATION:  Temperature is 97.9, pulse 70, respirations 20,  blood pressure 111/57, and O2 sat s 94% on 2 L, it was 94% earlier on  room air.  CHEST:  Much clearer with minimal wheeze if any.    She is set for her pulmonary function test today and her alpha-1  antitrypsin level.   PLAN:  To continue with treatments and follow.      Edward L. Juanetta Gosling, [name]  Electronically Signed     ELH/MEDQ  D:  05/30/2007  T:  05/30/2007  Job:  [id number]

## 2010-06-28 NOTE — Discharge Summary (Signed)
[name], [name]          ACCOUNT NO.:  [id number]   MEDICAL RECORD NO.:  [id number]          PATIENT TYPE:  INP   LOCATION:  A304                          FACILITY:  [facility]   PHYSICIAN:  Mila Homer. Sudie Bailey, M.D.DATE OF BIRTH:  Sep 24, 1975   DATE OF ADMISSION:  12/16/2006  DATE OF DISCHARGE:  11/05/2008LH                               DISCHARGE SUMMARY   This 35 year old was admitted to the hospital with pneumonia.  She had a  benign 3-day hospitalization extending from November 3 to December 19, 2006. Vital signs remained stable.   Her chest x-ray October 20, prior to admission had shown chronic  changes of asthma with possible superimposed perihilar infiltrates but  on admission her chest x-ray November 2 showed left worse than right  airspace disease compatible with pneumonia.   Admission white cell count of 6800 of which showed 56% neutrophils, 31  lymphs, 9 monos.  H&H was 11.5 and 34.4, MCV of 79.4, platelet count  187,000.  Recheck CBC the following day showed a white cell count 4000  with the 86% neutrophils, 14 lymphs.  Her admission CMP was essentially  normal.  Blood gas on room air on admission showed pH 7.43, pCO2 of 29,  pO2 of 62.  Her D-dimer was 0.22 and blood cultures x2 showed no growth  at 2 days.   She was admitted to the hospital.  She was put on a regular diet, had  vitals q.4 h and normal saline IV at 100 mL/hour, O2 at 2 liters via  nasal cannula and was put on Solu-Medrol 125 mg IV q.6 h.  She was given  Rocephin 1 gram IV q.24 h, Levaquin 500 mg IV q. 24 hours and continued  on Prozac 20 mg daily.  She received albuterol and  Atrovent neb  treatments q.4 h while awake.   She did well on this regimen but on the second day still was wheezing  significantly.  She also was tried on nicotine 21 mg patch daily but  that turned a bit too strong and caused nausea and so had to be  decreased to a 7 mg patch.  Her second day she was noted to be coughing  up some centimeter diameter globules of thick brownish tenacious sputum  and orders were for this be sent for culture.   By her third day, she was doing much better.  She said she was moving  air well and had rare wheezing in the lung. She actually said she felt  10 times better.   She was discharged home on Ceftin 500 mg b.i.d. for 10-day course (20  with zero refills),  prednisone 10 mg 2 tablets b.i.d. for 3 days, 1  tablet b.i.d. for 3 days, 1 tablet daily thereafter (30 no refills). She  is to start using Chantix 1 mg b.i.d. again, (56, two refills).  For the  first few days to a week, she was to use a low-dose nicotine patch at 7  mg a day.  She was also to continue with her Advair discus b.i.d., her  Prozac 20 mg daily.  Since she was  an outpatient failure on p.o.  Levaquin, I discontinued that and just used the Ceftin alone.   FINAL DISCHARGE DIAGNOSES:  1. Pneumonia.  2. Asthmatic bronchitis.  3. Tobacco use disorder.  4. Depression.   Follow-up in the office in 2 days.      Mila Homer. Sudie Bailey, [name]  Electronically Signed     SDK/MEDQ  D:  12/19/2006  T:  12/19/2006  Job:  [id number]

## 2010-07-01 NOTE — Discharge Summary (Signed)
NAMEHALCYON, [name]          ACCOUNT NO.:  [id number]   MEDICAL RECORD NO.:  [id number]          PATIENT TYPE:  INP   LOCATION:  A304                          FACILITY:  [facility]   PHYSICIAN:  Mila Homer. Sudie Bailey, M.D.DATE OF BIRTH:  [birth year]   DATE OF ADMISSION:  09/15/2004  DATE OF DISCHARGE:  LH                                 DISCHARGE SUMMARY   This 35 year old was admitted to the hospital with asthma/COPD flareup last  night.  She feels much better now.  She had been having some problems with  her lungs off and on in the last few weeks but may have run out of Advair.  She has also been on albuterol by nebulizer.  She smokes cigarettes of 1/2  pack a day.   The patient developed asthma at age 67 after an infection.   In the hospital, she was found to have a sodium of 132, now 134 today,  potassium 3, now 3.9 today, glucose 127, now 150.  Treatment included nasal  O2 at 3 L a minute, IV of 1/2 normal saline KVO, IV Solu Medrol 250 mg  q.4h., Singulair 10 mg daily and Lovenox shots.  IV Solu Medrol was  decreased to 125 mg q.8h. later her first day.  She was given albuterol and  Atrovent treatments with K-Dur 40 p.o. one-time dose.   She did well on the above regimen, breathing much better.  Lung expansion  was good.  Color was good.  No intercostal retractions.  No use of accessory  muscles of respiration, just occasional rhonchi in the lungs at the time of  discharge.   She was discharged home on a Nicoderm 21-mg patch (32 refills), Z-Pak,  prednisone taper starting at 10 mg, 2 b.i.d. for 3 days, then 1 b.i.d. for 3  days, 1 daily thereafter, Singulair 10 mg daily (30 with 11 refills) and  Advair 250/50, puff b.i.d. (two refills).  She is to use albuterol nebulizer  at home.  Followup with me is to be in the office within 5 days.   FINAL DISCHARGE DIAGNOSES:  1.  Asthma.  2.  Tobacco use disorder.   Note time spent with the patient today, talking to her, trying to  devise  ways for her to get off her cigarettes, finally, talking about the  medication treatments we can use, examining the patient, formulating a plan,  reviewing electronic and paper medical records, dictating a note was 45  minutes.      Mila Homer. Sudie Bailey, [name]  Electronically Signed     SDK/MEDQ  D:  09/16/2004  T:  09/16/2004  Job:  [id number]

## 2010-07-01 NOTE — H&P (Signed)
NAME:  [name], [name]                    ACCOUNT NO.:  [id number]   MEDICAL RECORD NO.:  [id number]                   PATIENT TYPE:  INP   LOCATION:  0260                                 FACILITY:  [facility]   PHYSICIAN:  Jackie Plum, [name]             DATE OF BIRTH:  [birth year]   DATE OF ADMISSION:  10/24/2002  DATE OF DISCHARGE:                                HISTORY & PHYSICAL   CHIEF COMPLAINT:  Wheezing.   HISTORY OF PRESENT ILLNESS:  [name] is a 35 year old white female  smoker with asthma who admits to the emergency room with complaints of  wheezing and dyspnea on exertion, as well as cough.  This is her fourth  visit to the emergency room and/or urgent care center in the past three  days.  She began feeling sick three days ago and was given Tequin.  She has  a cough productive of yellow sputum.  Her subjective fevers and chills are  improving but her wheezing is no better.  She has never been intubated.  She  has had several admissions for status asthmaticus in the past.   PAST MEDICAL HISTORY:  Past medical history as above.   MEDICATIONS:  Advair, albuterol hand-held nebulizers, Tequin.   SOCIAL HISTORY:  The patient smokes.  She does not drink.  No drugs.   FAMILY HISTORY:  Family history is noncontributory.   REVIEW OF SYSTEMS:  CONSTITUTIONAL:  As above.  HEENT:  No headache or sore  throat.  RESPIRATORY:  As above.  CARDIOVASCULAR:  No palpitations.  GI:  She has been eating and drinking well.  GU:  No dysuria.  MUSCULOSKELETAL:  No arthralgias or myalgias.  SKIN:  No rash.  PSYCHIATRIC:  No depression.  NEUROLOGIC:  No seizures.  ENDOCRINE:  No diabetes.   PHYSICAL EXAMINATION:  VITAL SIGNS:  On physical examination, patient is  afebrile.  Her heart rate is 90, respiratory rate 24, oxygen saturation 93%  on room air, blood pressure 104/78.  GENERAL:  In general, the patient is able to speak in full sentences.  HEENT:  Normocephalic, atraumatic.   Pupils are equal, round and reactive to  light.  She has poor dentition and slightly dry mucous membranes.  Oropharynx is without any erythema or exudate.  NECK:  Neck is supple.  No lymphadenopathy.  No thyromegaly.  LUNGS:  She has diffuse expiratory wheezes and a few inspiratory wheezes, no  rales or rhonchi.  CARDIOVASCULAR:  Regular rate and rhythm without murmurs, gallops or rubs.  ABDOMEN:  Normal bowel sounds.  Soft, nontender and nondistended.  GU AND RECTAL:  Deferred.  EXTREMITIES:  No clubbing, cyanosis, or edema.  SKIN:  No rash.  PSYCHIATRIC:  Normal affect.  NEUROLOGIC:  Alert and oriented.  Cranial nerves and sensory and motor exam  are intact.   RADIOLOGIC FINDINGS:  She had a chest x-ray done two days ago at [facility]  which was negative.  ASSESSMENT AND PLAN:  1. Resolving bronchitis and status asthmaticus.  The patient will be     admitted to the floor, get hand-held nebulizers, intravenous steroids.  I     will continue her fluoroquinolone.  2. Tobacco abuse, counseled against.     Corinna L. Lendell Caprice, MD                   Jackie Plum, [name]    CLS/MEDQ  D:  10/24/2002  T:  10/24/2002  Job:  [id number]
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
## 2010-07-01 NOTE — H&P (Signed)
[name], [name]                    ACCOUNT NO.:  [id number]   MEDICAL RECORD NO.:  [id number]                   PATIENT TYPE:  INP   LOCATION:  0363                                 FACILITY:  [facility]   PHYSICIAN:  Corwin Levins, [name] [facility]             DATE OF BIRTH:  09/27/1975   DATE OF ADMISSION:  03/27/2003  DATE OF DISCHARGE:                                HISTORY & PHYSICAL   CHIEF COMPLAINT:  Here with remarkably increased wheezing and shortness of  breath with fever up to 102.2, found to be hypoxic in the office after seen  several days ago with bronchitis.   HISTORY OF PRESENT ILLNESS:  [name] is a 35 year old white female with  a history of asthma with frequent exacerbations in the past apparently due  to viral illnesses with wheezing and several hospitalizations, who is here  with similar symptoms.  She had marked increased wheezing and shortness of  breath despite q.3h. nebulizers at home.   PAST MEDICAL HISTORY:  Asthma.   PAST SURGICAL HISTORY:  None.   ALLERGIES:  None.   MEDICATIONS:  1. Advair 250/50 one puff b.i.d.  2. Albuterol meter dosed inhaler p.r.n.  3. Nebulizer treatments available on a p.r.n. basis.   SOCIAL HISTORY:  Tobacco:  One pack a day.  Alcohol:  Rare.   FAMILY HISTORY:  Mother with throat tumor.   REVIEW OF SYSTEMS:  She has diffuse body aches, worse in the lower back.  All other systems noncontributory.   PHYSICAL EXAMINATION:  GENERAL:  [name] is a 35 year old white female.  She is at least moderately ill-appearing.  VITAL SIGNS:  Blood pressure 120/60, respirations 20, pulse 120, temperature  102.2, weight 160.  O2 saturation 88% on room air with audible wheezing.  She is not using accessory muscles.  HEENT:  Sclerae clear.  TMs with bilateral erythema.  Pharynx with marked  erythema.  NECK:  Without lymphadenopathy, JVD, thyromegaly.  CHEST:  Decreased breath sounds bilaterally.  Diffuse wheezing noted.  CARDIOVASCULAR:  Regular rate and rhythm.  ABDOMEN:  Soft, nontender, positive bowel sounds.  No organomegaly, no  masses.  EXTREMITIES:  No edema.  NEUROLOGIC:  Cranial nerves II-XII intact.  Otherwise nonfocal.   LABORATORY DATA:  Done prior to visit.   ASSESSMENT AND PLAN:  Asthma exacerbation with hypoxia, probable infectious  in origin.  She is to be admitted, placed on O2, given frequent nebulizer  treatments, as well as intravenous antibiotics, intravenous steroids, and  pulmonary toilet.  She will need to consider pulmonary referral and we will  hold home medications at this time except for the nebulizer treatments.                                               Corwin Levins, [name] [facility]  JWJ/MEDQ  D:  03/29/2003  T:  03/29/2003  Job:  [id number]

## 2010-07-01 NOTE — H&P (Signed)
[place]  Patient:    [name], [name]                   MRN: [id number] Adm. Date:  06/09/99 Attending:  Duncan Dull, [name]                         History and Physical  HISTORY OF PRESENT ILLNESS:  This is the second [facility] admission for this 35 year old white female with a history of recurrent asthma.  She was admitted in September 2000, with the same symptoms.   She presents with a weeks history of cold symptoms, runny nose, and cough, which has acutely worsened over the last 24 hours.  She states she has noted an increase in wheezing, and as been using her home nebulizer machine with albuterol every four hours, without ny relief.  She states she has coughed so much that she has vomited.  She has not ad any fever, and has not been coughing up any discolored mucus.  She presented to our office with these symptoms.  She was noted to be quite dyspneic and was given a  nebulizer treatment with no improvement in her symptoms or in her peak flow, which remained at 240.  Her oxygen saturation initially on room air was 96% after the  nebulizer had decreased to 93%.  Because of her worsening symptoms, she is being admitted for more intensive treatment.  PAST MEDICAL HISTORY:  Hospitalizations multiple times for asthma.  CURRENT MEDICATIONS: 1. Albuterol per nebulizer p.r.n. 2. Flovent 110, two puffs b.i.d. 3. Serevent two puffs b.i.d.  ALLERGIES:  No known drug allergies.  PAST SURGICAL HISTORY:  None.  FAMILY HISTORY:  Positive for asthma in her brother, sister, and an aunt.  SOCIAL HISTORY:  She is single.  She does smoke about one pack of cigarettes day. She states she has tried to quit, but is unable to do so.  She does not drink any alcohol.  PHYSICAL EXAMINATION:  GENERAL:  She is alert, in moderate respiratory distress, with audible wheezing.  VITAL SIGNS:  Weight 148 pounds, temperature 99.1  degrees, pulse 104, respirations 24, blood pressure 94/64.  HEENT:  Eyes:  Pupils equal, round, reactive to light.  Ears:  Tympanic membranes slightly retracted.  Pharynx mildly erythematous.  NECK:  Negative.  LUNGS:  With inspiratory and expiratory wheezing with only fair air movement.  HEART:  Very distant heart sounds, difficult to hear due to her wheezing.  ABDOMEN:  Soft, nontender.  Without masses.  PELVIC:  Deferred.  RECTAL:  Deferred.  EXTREMITIES:  No edema.  NEUROLOGIC:  Grossly intact.  A chest x-ray shows increased markings, especially in the right lower lobe.  No  definite infiltrate.  ASSESSMENT: 1. Status asthmaticus. 2. Bronchitis. 3. Tobacco abuse. 4. History of depression.  PLAN:  The patient will be admitted to the floor with IV Solu-Medrol, nebulizer  treatments with albuterol q.2-4h.  Will start Zithromax to cover for the bronchitis.  Also Humibid LA as an expectorant, and Phenergan with codeine to suppress the cough at night.  Because of her vomiting, she will be given Phenergan as needed.  A pregnancy test was done in the office and was negative.  Will check a CBC and a comprehensive metabolic panel on admission.  Will start her on oxygen at 2 L per minute and monitor her pulse oximetry q.8h.  DD:  06/09/99 TD:  06/09/99 Job:  [id number]

## 2010-07-01 NOTE — H&P (Signed)
[facility]  Patient:    [name], [name]                 MRN: [id number] Adm. Date:  [id number] Attending:  Jetty Duhamel T                         History and Physical  DATE OF BIRTH:  1975/05/05  PRIMARY CARE PHYSICIAN:  Unassigned.  CHIEF COMPLAINT:  Asthma exacerbation.  HISTORY OF PRESENT ILLNESS:  [name] is a 35 year old Caucasian female who presents to the [facility] Emergency Room with the acute onset of severe shortness of breath which began early this morning.  She reports that she woke up with a severe cough and found herself to be wheezing severely.  She states that she has cough and paroxysms which have caused diffuse pain across her chest and that she does bring up a yellowish phlegm when she coughs.  Since the onset she feels that her symptoms have progressed despite use of albuterol metered dose inhaler as well as albuterol nebulizer at home.  Currently, she is struggling to catch her breath and is speaking in broken sentences because of her dyspnea.  Her symptoms began approximately three weeks ago with slow progression of shortness of breath and cough at which time she was originally evaluated in the emergency room and given azithromycin and prednisone.  She reported that over the course of three to five days she improved and went approximately five days without symptoms.  Current symptoms, however, then began to slowly reappear and have progressed to the point that she had the acute exacerbation as above.  She has vomited on one episode which she relates to the severe cough and phlegm in her throat "making her sick."  Additionally, she noted a temperature measured at home of 101.0 on one episode.  REVIEW OF SYSTEMS:  Negative for nausea, diarrhea, constipation, rhinorrhea, acute visual change, headache, focal weakness, paralysis, paresthesias, abdominal pain, chest pain, palpitations, hemoptysis,  hematemesis, hematochezia, melena, acute change in mood.  Positive for diminished appetite x 1 week.  The patient is currently in the middle of her menstrual period. She states she uses condoms for contraception.  She lives in a home that is carpeted throughout, but does not have animals in the home.  PAST MEDICAL HISTORY: 1. Asthma.    a. Multiple prior admissions for status asthmaticus.    b. No history of intubation.    c. Diagnosis made at 35 years of age.    d. History of noncompliance with baseline regimen. 2. Ongoing tobacco abuse.  Patient admits to smoking one-half to one pack per    day for multiple years and that she has "failed at attempts to stop." 3. Status post lumbar discectomy December 2000. 4. Status post cesarean section 1997.  MEDICATIONS: 1. Albuterol nebulizer p.r.n. 2. Albuterol metered dose inhaler t.i.d.-q.i.d. 3. Flovent inhaler.  Patient states that she uses t.i.d.-q.i.d. 4. Serevent.  Has been prescribed but currently patient is out and has not    been using.  ALLERGIES:  No known drug allergies.  FAMILY HISTORY:  Mother and father are both alive and healthy.  There is a history of asthma in a brother, sister, and maternal aunt.  SOCIAL HISTORY:  Patient is single.  She reports to the emergency room with her boyfriend.  She has one child that was born by cesarean section.  She uses condoms for contraception.  Smoking  history is as above.  She denies alcohol use.  PHYSICAL EXAMINATION:  GENERAL:  A 35 year old female who appears stated age who is sitting upright in bed with obvious dyspnea and pursed lip breathing with cyanotic appearance about lips.  VITAL SIGNS:  Temperature 97.6, blood pressure 84/59, pulse 119, respiratory rate 26-20, O2 saturation 92% on 40% ______.  HEENT:  Normocephalic, atraumatic.  Pupils are equal, round and reactive to light and accommodation.  Extraocular muscles intact bilaterally.  Nasal mucosa and oral mucosa dry  without discharge or epistaxis.  NECK:  No lymphadenopathy or thyromegaly.  CARDIOVASCULAR:  Tachycardic at a rate of approximately 120 beats per minute with no appreciable murmur at this rate.  LUNGS:  Diffuse prolonged expiratory wheeze with one to two second pause before onset of inspiration with minimal air movement throughout all fields.  ABDOMEN:  Diffusely tender to deep palpation.  No masses.  Soft.  Bowel sounds present.  No ascites.  No hepatosplenomegaly.  No rebound.  EXTREMITIES:  No cyanosis, clubbing, edema bilateral lower extremities.  NEUROLOGIC:  Strength 5/5 throughout bilateral upper and lower extremities. Intact sensation to touch throughout.  No Babinski.  Cranial nerves 2-12 intact bilaterally.  Alert and oriented x 4.  Vascular:  Dorsalis pedis pulses 2+ bilaterally.  LABORATORIES:  Chest x-ray revealing findings consistent with air trapping and asthma, but no acute infiltrate.  Sodium 139, potassium 2.8, chloride 107, CO2 25, glucose 98, BUN 6, creatinine 0.7, calcium 9.1, total protein 6.8, albumin 3.9, AST 16, ALT 8, alkaline phosphatase 69, total bilirubin 0.6.  White count 11.5, hemoglobin 13.6, platelet count 213,000, MCV 87.2, absolute granulocyte count 9.3.  Room air blood gas:  pH 7.453, PCO2 30.7, PO2 53.6.  IMPRESSION AND PLAN: 1. Status asthmaticus.  Based upon the examination as noted above, the patient    is in a significant amount of difficulty with severe bronchospasm.  She has    been dosed with magnesium sulfate as well as an oral dose of prednisone at    60 mg in the emergency room.  Due to her significant air trapping and    prolonged expiratory phase as well as tachypnea, I feel that she is at    danger of becoming exhausted and developing acute respiratory arrest.  As a    result I will place her in the step-down unit for close monitoring.  She    will be dosed aggressively with albuterol 2.5 mg via nebulizer q.2h. with    addition  of Atrovent q.6h.  She will be allowed to use albuterol on a q.1h.    basis as deemed appropriate by the nursing staff that will follow the     patient in the step-down unit.  I will increase her steroid dosing to    Solu-Medrol 125 q.12h. and follow closely.  I will follow a magnesium level    in the morning and redose with magnesium sulfate if necessary.  The patient    will be dosed with oxygen carefully via ______ mask.  Due to the patients    history of significant cough and sputum production, I will also place the    patient on empiric Tequin at 400 mg intravenous q.d. 2. Hypoxia.  The patients hypoxia is likely secondary to very poor air    movement now with severe status asthmaticus.  I will treat the patient with    a ______ mask to allow close titration of oxygen saturation with a goal  for saturations of greater than or equal to 90%.  She currently is    responding well to this intervention.  I will followup an ABG in the    morning to assess both CO2 retention as well as resolution of hypoxia. 3. Hypokalemia.  It is likely that the patients hypokalemia is secondary to    decreased nutritional intake as well as aggressive albuterol therapy.  I    will dose the patient with supplemental potassium and follow-up on a    potassium level in the morning.  In addition, I will supplement her with    maintenance fluids including potassium.  Magnesium level will be drawn in    the morning but the patient has been dosed with magnesium from a    bronchospastic standpoint and therefore it is not likely that    hypomagnesemia is contributing to her hypokalemia. 4. Hypotension.  Due to the patients young age it is likely that her blood    pressure is chronically in the lower range and her current admission blood    pressure likely represents a simple exacerbation due to air trapping.    There is no evidence of true hypotension or decreased perfusion.  I will    dose the patient with ongoing  intravenous fluids at 150 cc/hour to assure    that she does not become volume depleted with her significant loss through    tachypnea.  BUN/creatinine ratio at this time does not suggest a    significant volume depletion and I do not feel that this represents a    sepsis based on the clinical picture. 5. Tobacco abuse.  Patient has been consulted on the absolute need for her to    discontinue tobacco abuse.  I will continue counseling her throughout her    hospitalization and offer her nicotine patch or gum as she feels motivated    to discontinue smoking.DD:  03/14/00 TD:  03/14/00 Job: [id number]

## 2010-07-01 NOTE — H&P (Signed)
[name], [name]          ACCOUNT NO.:  [id number]   MEDICAL RECORD NO.:  [id number]          PATIENT TYPE:  INP   LOCATION:  A303                          FACILITY:  [facility]   PHYSICIAN:  Hanley Hays. Dechurch, M.D.DATE OF BIRTH:  08-Jan-1976   DATE OF ADMISSION:  04/21/2004  DATE OF DISCHARGE:  LH                                HISTORY & PHYSICAL   The patient is a 35 year old Caucasian female with history of asthma and  tobacco abuse who was in her usual state of health until about two weeks  prior when she developed upper respiratory symptoms with rhinorrhea,  sneezing, and cough.  The symptoms initially subsided after several days but  over the last three to four days, she has developed cough, increased  wheezing, and fever.  She is now complaining of some shortness of breath and  chest tightness as well as back pain, diffuse myalgias. She has a fever of  101 at home today.  She came into the emergency room where chest x-ray  reveals bilateral patchy infiltrates.  No fever here in the emergency room  but tight chest with decreased air movement.  She has a normal white count  with mild lymphocytosis.  She is being admitted to the hospital for  supportive care.   PAST MEDICAL HISTORY:  1.  Asthma usually controlled with Advair and occasional nebulizer though      worse over the last six or seven months since she moved back to      [place] from [place].  2.  Tobacco abuse, currently smoking half a pack per day; prior to that, two      packs a day.  3.  Gravida 1, para 2, AB 1.   PAST SURGICAL HISTORY:  1.  Cesarean section.  2.  D&C post spontaneous abortion.   ALLERGIES:  None known.   MEDICATIONS:  Advair and albuterol nebulizers.   SOCIAL HISTORY:  Occasional alcohol.  Currently half a pack per day of  tobacco; prior two packs a day.  She is employed with a temporary agency.  She has one child age 71, lives with her mother.   FAMILY HISTORY:  Pertinent for  thyroid problems in her mother and a sister  with asthma.   REVIEW OF SYSTEMS:  She has had some problems with abdominal complaints and  belly pain.  She was scheduled for a CT today but she was too sick to go.  Denies any other GI symptoms.  No GU, neuro, endocrine.  Notes increased use  of her nebulizer since moving back to [place].   PHYSICAL EXAMINATION:  GENERAL:  Well-developed, well-nourished female in no  distress.  VITAL SIGNS: Blood pressure 113/73, O2 saturation 100% on room air,  respiratory rate 22.  She has a course congestive cough.  Oropharynx is  moist.  She has no lesions, no skin rash. Blood pressure 113/70.  Pulse 88  and regular.  NECK:  Supple.  No JVD or adenopathy.  LYMPH NODES:  No axillary, inguinal adenopathy.  LUNGS:  Coarse breath sounds.  She has some inspiratory wheezing and  expiratory wheezing bilaterally  with decreased breath sounds throughout.  HEART:  Regular.  No murmur.  ABDOMEN:  Soft and nontender.  No mass.  EXTREMITIES:  Without clubbing, cyanosis. There is no edema.  NEUROLOGIC:  Completely intact.   ASSESSMENT:  Pneumonia, probable post viral versus other; i.e. atypical.  An  influenza screen has been sent.  We will try to obtain sputum culture.  She  has received Zithromax and Rocephin here in the emergency room and IV Solu-  Medrol which will be continued along with supportive care.      FED/MEDQ  D:  04/21/2004  T:  04/22/2004  Job:  [id number]

## 2010-07-01 NOTE — Consult Note (Signed)
NAMEKRISSIA, SCHREIER          ACCOUNT NO.:  [id number]   MEDICAL RECORD NO.:  [id number]          PATIENT TYPE:  OBV   LOCATION:  A332                          FACILITY:  [facility]   PHYSICIAN:  Vickki Hearing, M.D.DATE OF BIRTH:  1975/07/05   DATE OF CONSULTATION:  06/30/2005  DATE OF DISCHARGE:  06/30/2005                                   CONSULTATION   REFERRING PHYSICIAN:  Mila Homer. Sudie Bailey, [name]   CHIEF COMPLAINT:  Back pain.   HISTORY:  [name] is a 35 year old female who presented with a  low back pain through the emergency room, was brought in because of  uncontrolled pain.  She described acute onset of pain but has had back pain  in the past.   Dictation stopped at this point.      Vickki Hearing, [name]  Electronically Signed     SEH/MEDQ  D:  07/03/2005  T:  07/03/2005  Job:  [id number]

## 2010-07-01 NOTE — Op Note (Signed)
NAME:  [name], [name]                    ACCOUNT NO.:  [id number]   MEDICAL RECORD NO.:  [id number]                   PATIENT TYPE:  MAT   LOCATION:  MATC                                 FACILITY:  [facility]   PHYSICIAN:  Carrington Clamp, [name]              DATE OF BIRTH:  [birth year]   DATE OF PROCEDURE:  DATE OF DISCHARGE:                                 OPERATIVE REPORT   PREOPERATIVE DIAGNOSIS:  Incomplete abortion.   POSTOPERATIVE DIAGNOSIS:  Incomplete abortion.   PROCEDURE:  Suction, dilation and curettage.   SURGEON:  Carrington Clamp, [name]   ANESTHESIA:  General endotracheal anesthesia.   ESTIMATED BLOOD LOSS:  100 mL.   INTRAVENOUS FLUIDS:  400 mL.   URINE OUTPUT:  Not measured.   COMPLICATIONS:  None.   FINDINGS:  Seven week uterus down to six week size.  Post procedure there  was good crie.   MEDICATIONS:  Methergine, Ancef and lidocaine.   PATHOLOGY:  Uterine contents.   COUNTS:  Correct x 3.   DESCRIPTION OF PROCEDURE:  After adequate general anesthesia was achieved,  the patient was prepped and draped in the usual sterile fashion in the  dorsal lithotomy position.  A speculum was placed in the vagina after the  bladder had been emptied and the cervix grasped with a single-tooth  tenaculum.  The cervix needed only a little bit of dilation with the Hanks  dilators and then easily passed the 7 mm curet which then suctioned some  products of conception.  The alternating sharp curettage and suction  revealed good crie and minimal bleeding. All instruments were then withdrawn  from the vagina, and the patient returned to the recovery room in stable  condition.                                               Carrington Clamp, [name]    MH/MEDQ  D:  01/09/2003  T:  01/09/2003  Job:  [id number]

## 2010-07-01 NOTE — Procedures (Signed)
NAMEUCHENNA, SEUFERT          ACCOUNT NO.:  [id number]   MEDICAL RECORD NO.:  [id number]          PATIENT TYPE:  INP   LOCATION:  A321                          FACILITY:  [facility]   PHYSICIAN:  Edward L. Juanetta Gosling, M.D.DATE OF BIRTH:  [birth year]   DATE OF PROCEDURE:  DATE OF DISCHARGE:  05/30/2007                            PULMONARY FUNCTION TEST    1. Spirometry shows a moderate ventilatory defect with evidence of      airflow obstruction.  2. Lung volumes show mild reduction of total lung capacity suggesting      an additional restrictive component.  3. DLCO was moderately reduced.  4. There is significant bronchodilator improvement.  5. This is consistent with asthma, but there may be as mentioned in      additional restrictive component some of which may be related to      the patient's body habitus based on height and weight.      Edward L. Juanetta Gosling, [name]  Electronically Signed     ELH/MEDQ  D:  05/31/2007  T:  05/31/2007  Job:  [id number]   cc:   Mila Homer. Sudie Bailey, [name]  Fax: [phone]
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
## 2010-07-01 NOTE — Group Therapy Note (Signed)
[name], [name]          ACCOUNT NO.:  [id number]   MEDICAL RECORD NO.:  [id number]          PATIENT TYPE:  INP   LOCATION:  A303                          FACILITY:  [facility]   PHYSICIAN:  Mila Homer. Sudie Bailey, M.D.DATE OF BIRTH:  09/17/1975   DATE OF PROCEDURE:  04/22/2004  DATE OF DISCHARGE:                                   PROGRESS NOTE   SUBJECTIVE:  This 35 year old was admitted to the hospital yesterday with  bilateral patchy pneumonia and asthma.  She feels somewhat better today.  The patient was smoking up to two packs of cigarettes a day until last  August, has gradually cut back, now about 1/2 pack a day.  Claims that she  has been on the Nicotine patch in the past, has not helped her, on the other  hand she craves cigarettes when she is off them.   When she came in, her potassium was 3.4, and x-rays showed bilateral patchy  pneumonia.   OBJECTIVE:  VITAL SIGNS:  Temperature is 97.3, pulse 79, respiratory rate  20, blood pressure 105/48.  GENERAL:  She is semi-recumbent in bed, in no acute distress.  Well-  developed, well-nourished.  Alert and oriented.  HEENT:  Mucous membranes are moist.  Pharynx is normal.  Negative nodes.  SKIN:  Skin turgor is normal.  LUNGS:  Expiratory wheezes throughout, but she is moving air well, no  intercostal retractions, no use of accessory muscles of respirations.  ABDOMEN:  Soft without hepatosplenomegaly, mass, or tenderness.  EXTREMITIES:  There is no edema of the ankles.   ASSESSMENT:  1.  Pneumonia.  2.  Asthma.  3.  Tobacco use disorder.  4.  Hypokalemia.   PLAN:  1.  Continue with intravenous Zithromax and Rocephin daily.  2.  Solu-Medrol 125 mg intravenously q.6h.  3.  Albuterol 2.5 mg q.4h. by nebulizer.  4.  She had one dose of KCL, and she is eating well and appetite is okay, so      we will not pursue that.  5.  Add Nicoderm 21 mg patch daily to see if this helps her.  As we      discussed, this is a good  opportunity to go off the cigarettes.      SDK/MEDQ  D:  04/22/2004  T:  04/22/2004  Job:  [id number]

## 2010-07-01 NOTE — Consult Note (Signed)
[name], [name]          ACCOUNT NO.:  [id number]   MEDICAL RECORD NO.:  [id number]          PATIENT TYPE:  INP   LOCATION:  A332                          FACILITY:  [facility]   PHYSICIAN:  Vickki Hearing, M.D.DATE OF BIRTH:  [birth year]   DATE OF CONSULTATION:  06/30/2005  DATE OF DISCHARGE:                                   CONSULTATION   CHIEF COMPLAINT:  Back pain.   HISTORY:  This is a 35 year old female who had a diskectomy done about seven  years ago by Dr. Jordan Likes for a herniated disk.  She has had symptoms off and on  but nothing severe over the last seven years.  She presented to the  emergency room on Jun 29, 2005, with severe lower back pain radiating to  both legs characterized as cramping, deep, gnawing, grabbing and throbbing  and described as a 9 out of 10.  The pattern was episodic, the course was  persistent.  There were no other associated complaints. She did note that  she had taken Flexeril and Aleve with no improvement.   REVIEW OF SYSTEMS:  CONSTITUTIONAL:  Negative.  EENT:  Negative.  MUSCULOSKELETAL:  As stated.  SKIN:  Negative.  NEURO:  Essentially  negative.  The other five systems reviewed were also negative.   PHYSICAL EXAMINATION:  VITAL SIGNS:  Stable.  APPEARANCE AND CONSTITUTIONAL:  The patient was well-developed, well-  nourished, grooming and hygiene normal, body habitus was mesomorphic.  CARDIOVASCULAR EXAM:  Observation no swelling.  Palpation of pulses normal.  Normal temperature.  No edema or tenderness.  LYMPH SYSTEM:  Normal.  SKIN:  Four extremities and trunk normal.  MUSCULOSKELETAL FINDINGS:  Gait and station were not tested.  The patient  was lying flat in bed and in some discomfort. She did have paraspinal muscle  tenderness midline L5-S1, L4-L5 tenderness, however, her four extremities  showed normal range of motion, strength, stability, alignment, lower  extremity reflexes were 2+ and equal and there were no pathologic  reflexes.   She did have studies done which included x-ray of her back.  I would like an  MRI, however, the patient has severe claustrophobia and I am recommending a  CT scan be done to get an idea of what pathology may be present.  She may  need an MRI with sedation.  She will definitely need spine consultation.  In  the meantime, I am recommending that she get IV steroids and Neurontin to  alleviate the acute inflammatory process causing the pain.  I will follow.      Vickki Hearing, [name]  Electronically Signed     SEH/MEDQ  D:  06/30/2005  T:  06/30/2005  Job:  [id number]   cc:   Mila Homer. Sudie Bailey, [name]  Fax: [phone]

## 2010-07-01 NOTE — Discharge Summary (Signed)
[name], LUELLEN          ACCOUNT NO.:  [id number]   MEDICAL RECORD NO.:  [id number]          PATIENT TYPE:  INP   LOCATION:  A303                          FACILITY:  [facility]   PHYSICIAN:  Mila Homer. Sudie Bailey, M.D.DATE OF BIRTH:  1975/02/18   DATE OF ADMISSION:  04/21/2004  DATE OF DISCHARGE:  LH                                 DISCHARGE SUMMARY   HISTORY OF PRESENT ILLNESS:  This is a 35 year old woman who was admitted to  the hospital with pneumonia and reactive asthma.  She has a benign three-day  hospital course extending from 04/21/04 to 04/23/04.   LABORATORY DATA:  Admission white cell count is 5700 of which 44  neutrophils, 47 lymphocytes, 5 monocytes, 4 eosinophils.  Her H&H was 12.5  and 36.8. MCV 84, and platelet count of 207,000.  BMET showed a sodium of  133, a potassium of 3.4, and a glucose of 117.   Chest x-ray showed patchy bilateral pneumonia.   HOSPITAL COURSE:  She was admitted by Dr. Hanley Hays. [place] on call for  me, and started on IV fluids of normal saline 125 cc an hour, and albuterol  nebulizer treatment q.4h. around the clock and q.2h. p.r.n.  She was given  Rocephin 1 gram IV daily with Zithromax 5 mg IV daily, Solu-Medrol 125 mg IV  q.6h. with Tessalon Perles 100 mg q.8h. p.r.n. cough, and Hycodan cough  syrup 5 cc q.4h. p.r.n. cough.  After first bag of IV fluids, the fluids  were switched to D5-1/2 normal saline with 20 mEq Kcl per liter, still 125  cc an hour.  She was given one dose of Kcl 40 mEq p.o.   She is on Nicoderm 21 mg patch daily, and given Motrin 800 mg q.8h. for  pain.   She was still wheezing her second day.  By the third day, the wheezing had  pretty much cleared, and she said she was feeling back towards normal.  It  was felt that she had reached maximum benefit from hospitalization and could  be discharged at that point.   FINAL DISCHARGE DIAGNOSES:  1.  Bilateral pneumonia.  2.  Asthma exacerbation.  3.  Tobacco use  disorder.  4.  Hypokalemia.   DISCHARGE MEDICATIONS:  She was discharged home on Zithromax 250 mg daily  for four days (#4, refills x0), Ceftin 500 mg b.i.d. for 10 days (#20,  refills x0), prednisone 10 mg, two tablets b.i.d. x3 days, one tablet b.i.d.  x3 days, one tablet daily thereafter (#30 refills x0), Hycodan 5 ml q.4h.  for cough, (4 ounces, refill 0),  albuterol by nebulizer q.4h. (#120 refills  x2), Nicoderm 21 mg patch (#34 refills x2), Advil 2 mg, four tablets, three  times a day for aches, and also a prescription for a home nebulizer.  I  strongly recommended she stop smoking.   Note time spent with this patient today reviewing her electronic medical  record, paper medical record, examining the patient, talking to the patient,  writing out prescriptions, writing out her medication list, dictating a note  was 35 minutes.  SDK/MEDQ  D:  04/23/2004  T:  04/23/2004  Job:  [id number]

## 2010-07-01 NOTE — H&P (Signed)
NAMEHETAL, PROANO          ACCOUNT NO.:  [id number]   MEDICAL RECORD NO.:  [id number]           PATIENT TYPE:  INP   LOCATION:  A304                          FACILITY:  [facility]   PHYSICIAN:  Angus G. Renard Matter, MD   DATE OF BIRTH:  08-30-1975   DATE OF ADMISSION:  DATE OF DISCHARGE:  LH                                HISTORY & PHYSICAL   A 35 year old white female presented herself to the emergency department  with respiratory difficulty. She does have a history of asthma. She was  evaluated by ED physician, given nebulizer treatment, albuterol, Solu-  Medrol. Continued to have breathing difficulty, and it was felt she should  be admitted for better control.   SOCIAL HISTORY:  The patient smokes cigarettes.   FAMILY HISTORY:  Asthma.   PAST MEDICAL HISTORY:  1.  The patient has a prior history of asthma.  2.  History of previous back surgery.  3.  D&C.   ALLERGIES:  No known drug allergies.   MEDICATIONS:  1.  Albuterol nebulizer treatment p.r.n.  2.  Advair Diskus b.i.d.   LABORATORY DATA:  Chemistries:  Sodium 132, potassium 3, chloride 105, CO2  22, glucose 127, BUN 6. Pulse oximetry 93%.   REVIEW OF SYSTEMS:  HEENT:  Negative. CARDIOPULMONARY:  The patient has had  dyspnea for several days, history of asthma. GASTROINTESTINAL:  No bowel  irregularity or bleeding. GENITOURINARY:  No dysuria or hematuria.   PHYSICAL EXAMINATION:  GENERAL:  Alert female with blood pressure 102/64,  respirations 20, pulse 106.  HEENT:  Eyes:  PERRLA. TMs negative. Oropharynx benign.  NECK:  Supple. No JVD or thyroid abnormalities.  LUNGS:  Occasional rhonchi over lower lung field.  HEART:  Regular rhythm, no murmurs.  ABDOMEN:  No palpable organs or masses.  SKIN:  Warm and dry.  EXTREMITIES:  Free of edema.  NEUROLOGICAL:  No focal deficit.   DIAGNOSIS:  Bronchial asthma.       AGM/MEDQ  D:  09/15/2004  T:  09/15/2004  Job:  [id number]

## 2010-07-01 NOTE — Discharge Summary (Signed)
NAMEMARLINDA, [name]                    ACCOUNT NO.:  [id number]   MEDICAL RECORD NO.:  [id number]                   PATIENT TYPE:  INP   LOCATION:  0363                                 FACILITY:  [facility]   PHYSICIAN:  [name], [name] [name]          DATE OF BIRTH:  [birth year]   DATE OF ADMISSION:  03/27/2003  DATE OF DISCHARGE:  03/30/2003                                 DISCHARGE SUMMARY   DISCHARGE DIAGNOSES:  1. Asthmatic exacerbation secondary to acute infectious bronchitis,     improved.  Continue antibiotics and prednisone taper as described.  2. Ongoing tobacco abuse.   DISCHARGE MEDICATIONS:  1. Avelox 400 mg p.o. daily times 5 additional days to complete a 10-day     course.  2. Albuterol nebulizers q.i.d. time next 7 days, then q.4h. p.r.n.  3. Advair 250/50 b.i.d.  4. Prednisone taper 60 mg p.o. daily, then 50 mg p.o. daily, etc., as per     primary care physician.  5. Tussionex one teaspoon p.o. q.12h. p.r.n. cough.   HOSPITAL FOLLOW UP:  Scheduled with her primary care physician, Dr. Sonda Primes, for Friday, April 03, 2003, at 9 o'clock a.m.  The patient is  to remain out of work until this time.  The patient is encouraged to  discontinue any and all tobacco abuse, as this likely exacerbates her  asthma.   CONDITION ON DISCHARGE:  Medically improved.  Tolerating p.o.  Saturations  95% on room air.  No wheezing with minimal exertion.  Continue only minimal  activity as tolerated.   BRIEF HOSPITAL COURSE BY PROBLEM:  #1.  Asthmatic exacerbation.  The patient  is a 35 year old woman who has been having cough and shortness of breath for  approximately 1 week prior to admission.  She was seen by her primary care  physician, who began her on empiric antibiotics in addition to a prednisone  taper, and encouraged her to use her albuterol nebulizer; however, over the  course of treatment of such at home, she failed to improve, and felt even  worse.  She  was seen again in the office and admitted by Dr. Efrain Sella for  asthmatic exacerbation.  She did have a temperature of 102.2.  She was  treated with Rocephin and azithromycin for infectious etiology, as well as  IV Solu-Medrol.  Her wheezing improved, and she became afebrile.  She was  treated with albuterol nebulizer as scheduled, which also showed marked  improvement in her symptoms.  At the time of discharge, she has mild  congested cough, but not related to exertion or bronchospasm.  She is felt  safe to be treated for continued resolution of her exacerbation at home on  oral antibiotics and prednisone taper, which she has begun without problem  here during hospitalization.  The patient is to return to the emergency room  or call her primary care physician if she has worsening shortness of breath  or fever  prior to her scheduled follow up for the end of this week on  Friday, as scheduled above.  #2.  Tobacco abuse.  The patient is instructed on the dangers of tobacco  abuse, especially with asthma.  She is a young woman, and strongly  encouraged to discontinue this habit.                                               [name], [name] [facility]   VL/MEDQ  D:  03/30/2003  T:  03/30/2003  Job:  [id number]

## 2010-11-08 LAB — BASIC METABOLIC PANEL
BUN: 10
CO2: 25
Calcium: 8.9
Chloride: 106
Creatinine, Ser: 0.76
Glucose, Bld: 126 — ABNORMAL HIGH

## 2010-11-08 LAB — CULTURE, BLOOD (ROUTINE X 2)

## 2010-11-08 LAB — BLOOD GAS, ARTERIAL
Acid-base deficit: 5.6 — ABNORMAL HIGH
Bicarbonate: 18.2 — ABNORMAL LOW
Patient temperature: 37
TCO2: 16.8
pH, Arterial: 7.402 — ABNORMAL HIGH

## 2010-11-08 LAB — CBC
MCHC: 34.1
MCV: 76.6 — ABNORMAL LOW
Platelets: 206
RDW: 14.5

## 2010-11-08 LAB — DIFFERENTIAL
Basophils Absolute: 0
Basophils Relative: 0
Eosinophils Absolute: 0.1
Monocytes Relative: 2 — ABNORMAL LOW
Neutrophils Relative %: 78 — ABNORMAL HIGH

## 2010-11-21 LAB — PREGNANCY, URINE: Preg Test, Ur: NEGATIVE

## 2010-11-22 LAB — BLOOD GAS, ARTERIAL
Acid-base deficit: 3.6 — ABNORMAL HIGH
Bicarbonate: 19.9 — ABNORMAL LOW
FIO2: 0.21
O2 Saturation: 92.7
Patient temperature: 98.6
pCO2 arterial: 29.9 — ABNORMAL LOW
pH, Arterial: 7.439 — ABNORMAL HIGH
pO2, Arterial: 62.9 — ABNORMAL LOW

## 2010-11-22 LAB — CULTURE, BLOOD (ROUTINE X 2)
Culture: NO GROWTH
Report Status: 11072008

## 2010-11-22 LAB — DIFFERENTIAL
Basophils Absolute: 0
Basophils Relative: 0
Eosinophils Absolute: 0
Eosinophils Absolute: 0.3
Eosinophils Relative: 0
Lymphocytes Relative: 14
Lymphocytes Relative: 31
Lymphs Abs: 0.6 — ABNORMAL LOW
Lymphs Abs: 2.1
Monocytes Absolute: 0 — ABNORMAL LOW
Monocytes Relative: 0 — ABNORMAL LOW
Monocytes Relative: 9
Neutro Abs: 3.4
Neutro Abs: 3.9
Neutrophils Relative %: 56
Neutrophils Relative %: 86 — ABNORMAL HIGH

## 2010-11-22 LAB — COMPREHENSIVE METABOLIC PANEL
ALT: 10
Calcium: 9.1
Creatinine, Ser: 0.9
GFR calc Af Amer: >60
Glucose, Bld: 100 — ABNORMAL HIGH
Sodium: 141
Total Protein: 6.3

## 2010-11-22 LAB — CULTURE, RESPIRATORY W GRAM STAIN

## 2010-11-22 LAB — EXPECTORATED SPUTUM ASSESSMENT W GRAM STAIN, RFLX TO RESP C

## 2010-11-22 LAB — CBC
Hemoglobin: 11.1 — ABNORMAL LOW
Hemoglobin: 11.5 — ABNORMAL LOW
MCHC: 33.5
RBC: 4.24
RDW: 14.9 — ABNORMAL HIGH
WBC: 4

## 2010-12-30 ENCOUNTER — Emergency Department (HOSPITAL_COMMUNITY)
Admission: EM | Admit: 2010-12-30 | Discharge: 2010-12-30 | Disposition: A | Discharge status: Home / Self Care | Payer: Self-pay | Attending: Emergency Medicine | Admitting: Emergency Medicine

## 2010-12-30 ENCOUNTER — Encounter: Payer: Self-pay | Admitting: Emergency Medicine

## 2010-12-30 DIAGNOSIS — F1010 Alcohol abuse, uncomplicated: Secondary | ICD-10-CM | POA: Insufficient documentation

## 2010-12-30 DIAGNOSIS — J449 Chronic obstructive pulmonary disease, unspecified: Secondary | ICD-10-CM | POA: Insufficient documentation

## 2010-12-30 DIAGNOSIS — F3289 Other specified depressive episodes: Secondary | ICD-10-CM | POA: Insufficient documentation

## 2010-12-30 DIAGNOSIS — F17200 Nicotine dependence, unspecified, uncomplicated: Secondary | ICD-10-CM | POA: Insufficient documentation

## 2010-12-30 DIAGNOSIS — R000 Tachycardia, unspecified: Secondary | ICD-10-CM | POA: Insufficient documentation

## 2010-12-30 DIAGNOSIS — D1739 Benign lipomatous neoplasm of skin and subcutaneous tissue of other sites: Secondary | ICD-10-CM | POA: Insufficient documentation

## 2010-12-30 DIAGNOSIS — F329 Major depressive disorder, single episode, unspecified: Secondary | ICD-10-CM | POA: Insufficient documentation

## 2010-12-30 DIAGNOSIS — J4489 Other specified chronic obstructive pulmonary disease: Secondary | ICD-10-CM | POA: Insufficient documentation

## 2010-12-30 HISTORY — DX: Depression, unspecified: F32.A

## 2010-12-30 HISTORY — DX: Major depressive disorder, single episode, unspecified: F32.9

## 2010-12-30 HISTORY — DX: Chronic obstructive pulmonary disease, unspecified: J44.9

## 2010-12-30 LAB — CBC
HCT: 34.4 % — ABNORMAL LOW (ref 36.0–46.0)
Hemoglobin: 10.7 g/dL — ABNORMAL LOW (ref 12.0–15.0)
MCHC: 31.1 g/dL (ref 30.0–36.0)
MCV: 80.2 fL (ref 78.0–100.0)
RDW: 15.5 % (ref 11.5–15.5)
WBC: 9.1 10*3/uL (ref 4.0–10.5)

## 2010-12-30 LAB — COMPREHENSIVE METABOLIC PANEL
Albumin: 3.9 g/dL (ref 3.5–5.2)
Alkaline Phosphatase: 106 U/L (ref 39–117)
BUN: 7 mg/dL (ref 6–23)
Chloride: 102 mEq/L (ref 96–112)
Creatinine, Ser: 0.63 mg/dL (ref 0.50–1.10)
GFR calc Af Amer: >90 mL/min (ref >90)
GFR calc non Af Amer: >90 mL/min (ref >90)
Glucose, Bld: 119 mg/dL — ABNORMAL HIGH (ref 70–99)
Potassium: 3.8 mEq/L (ref 3.5–5.1)
Total Bilirubin: 0.3 mg/dL (ref 0.3–1.2)

## 2010-12-30 LAB — PREGNANCY, URINE: Preg Test, Ur: NEGATIVE

## 2010-12-30 LAB — URINALYSIS, ROUTINE W REFLEX MICROSCOPIC
Glucose, UA: NEGATIVE mg/dL
Leukocytes, UA: NEGATIVE
Protein, ur: NEGATIVE mg/dL
Specific Gravity, Urine: 1.01 (ref 1.005–1.030)
pH: 6 (ref 5.0–8.0)

## 2010-12-30 LAB — ETHANOL: Alcohol, Ethyl (B): <11 mg/dL (ref 0–11)

## 2010-12-30 LAB — URINE MICROSCOPIC-ADD ON

## 2010-12-30 LAB — RAPID URINE DRUG SCREEN, HOSP PERFORMED: Opiates: NOT DETECTED

## 2010-12-30 MED ORDER — LORAZEPAM 1 MG PO TABS
2.0000 mg | ORAL_TABLET | Freq: Once | ORAL | Status: AC
  Administered 2010-12-30: 2 mg via ORAL
  Filled 2010-12-30: qty 2

## 2010-12-30 MED ORDER — LORAZEPAM 1 MG PO TABS: 1.0000 mg | ORAL_TABLET | ORAL | Status: DC | PRN

## 2010-12-30 MED ORDER — IBUPROFEN 400 MG PO TABS: 600.0000 mg | ORAL_TABLET | Freq: Three times a day (TID) | ORAL | Status: DC | PRN

## 2010-12-30 MED ORDER — PREDNISONE 20 MG PO TABS
40.0000 mg | ORAL_TABLET | Freq: Once | ORAL | Status: AC
  Administered 2010-12-30: 40 mg via ORAL
  Filled 2010-12-30: qty 2

## 2010-12-30 MED ORDER — IPRATROPIUM BROMIDE 0.02 % IN SOLN
0.5000 mg | RESPIRATORY_TRACT | Status: AC
  Administered 2010-12-30: 0.5 mg via RESPIRATORY_TRACT
  Filled 2010-12-30: qty 2.5

## 2010-12-30 MED ORDER — NICOTINE 21 MG/24HR TD PT24
21.0000 mg | MEDICATED_PATCH | Freq: Every day | TRANSDERMAL | Status: DC
Start: 2010-12-30 — End: 2010-12-30

## 2010-12-30 MED ORDER — ACETAMINOPHEN 325 MG PO TABS: 650.0000 mg | ORAL_TABLET | ORAL | Status: DC | PRN

## 2010-12-30 MED ORDER — ONDANSETRON HCL 4 MG PO TABS: 4.0000 mg | ORAL_TABLET | Freq: Three times a day (TID) | ORAL | Status: DC | PRN

## 2010-12-30 MED ORDER — ZOLPIDEM TARTRATE 5 MG PO TABS: 5.0000 mg | ORAL_TABLET | Freq: Every evening | ORAL | Status: DC | PRN

## 2010-12-30 MED ORDER — ALUM & MAG HYDROXIDE-SIMETH 200-200-20 MG/5ML PO SUSP: 30.0000 mL | ORAL | Status: DC | PRN

## 2010-12-30 MED ORDER — ALBUTEROL SULFATE (5 MG/ML) 0.5% IN NEBU
5.0000 mg | INHALATION_SOLUTION | Freq: Once | RESPIRATORY_TRACT | Status: AC
  Administered 2010-12-30: 5 mg via RESPIRATORY_TRACT
  Filled 2010-12-30: qty 1

## 2010-12-30 NOTE — BH Assessment (Addendum)
Assessment Note   [name] is an 35 y.o. female.   Pt reports she has Asthma and COPD and her alcohol and drug use is making her health problems worse.  She is seeking detox as she drinks a 18pk of beer daily`. She started drink beer  And smoking marijuana at age 2, snorting cocaine at age 23 and several months ago started snorting opiates and has been snorting pills daily for the past 3 weeks.  She smokes 3-4 joints daily for the past 5 years. She relapsed on crack/cocaine last night by smoking a 8 ball. She received detox at [facility] about 7 yrs ago and stayed sober for 23 months. Her drug use increased after loosing her job October 24, 2010. She receives unemployment and uses the money for drugs. Pt is not suicidal, homicidal, nor psychotic. Spoke with Okey Regal at [facility], [phone]  who requested to fax information.     12/30/10  1245 PT ACCEPTED AT [facility] BY [facility]. CALLED KATIE AT [facility] FOR SPONSORSHIP. SYSTEM WENT DOWN, SOMEONE WILL CALL ME BACK.     12/30/10   1540 RECEIVED CALL FROM [facility] AT [facility] PT AUTHORIZED 3 DAYS # [phone].  CALLED [facility] AND SPOKE WITH BRENDA, GAVE HER AUTHORIZATION  #.  WAS ADVISED TO SEND PT.  SPOKE WITH DR Manus Gunning WHO AGREED WITH DISPOSITION.      Axis I: Substance Abuse Axis II: Deferred Axis III:  Past Medical History  Diagnosis Date  . COPD (chronic obstructive pulmonary disease)   . Asthma   . Depression    Axis IV: occupational problems Axis V: 31-40 impairment in reality testing  Past Medical History:  Past Medical History  Diagnosis Date  . COPD (chronic obstructive pulmonary disease)   . Asthma   . Depression     Past Surgical History  Procedure Date  . Back surgery   . Cesarean section   . Dilation and curettage of uterus     Family History: History reviewed. No pertinent family history.  Social History:  reports that she has been smoking Cigarettes.  She has been smoking about 1 pack per day. She does not have any  smokeless tobacco history on file. She reports that she drinks alcohol. She reports that she uses illicit drugs.  Allergies: No Known Allergies  Home Medications:  Medications Prior to Admission  Medication Dose Route Frequency Provider Last Rate Last Dose  . acetaminophen (TYLENOL) tablet 650 mg  650 mg Oral Q4H PRN Vida Roller, MD      . albuterol (PROVENTIL) (5 MG/ML) 0.5% nebulizer solution 5 mg  5 mg Nebulization Once Vida Roller, MD   5 mg at 12/30/10 9147  . alum & mag hydroxide-simeth (MAALOX/MYLANTA) 200-200-20 MG/5ML suspension 30 mL  30 mL Oral PRN Vida Roller, MD      . ibuprofen (ADVIL,MOTRIN) tablet 600 mg  600 mg Oral Q8H PRN Vida Roller, MD      . ipratropium (ATROVENT) nebulizer solution 0.5 mg  0.5 mg Nebulization STAT Vida Roller, MD   0.5 mg at 12/30/10 8295  . LORazepam (ATIVAN) tablet 1 mg  1 mg Oral Q3H PRN Vida Roller, MD      . nicotine (NICODERM CQ - dosed in mg/24 hours) patch 21 mg  21 mg Transdermal Daily Vida Roller, MD      . ondansetron [facility]) tablet 4 mg  4 mg Oral Q8H PRN Vida Roller, MD      .  predniSONE (DELTASONE) tablet 40 mg  40 mg Oral Once Vida Roller, MD   40 mg at 12/30/10 0618  . zolpidem (AMBIEN) tablet 5 mg  5 mg Oral QHS PRN Vida Roller, MD       No current outpatient prescriptions on file as of 12/30/2010.    OB/GYN Status:  Patient's last menstrual period was 12/15/2010.     Risk to self Is patient at risk for suicide?: No Substance abuse history and/or treatment for substance abuse?: Yes                        Values / Beliefs Cultural Requests During Hospitalization: None Spiritual Requests During Hospitalization: None              Disposition:     On Site Evaluation by:   Reviewed with Physician:     Hattie Perch Winford 12/30/2010 11:43 AM

## 2010-12-30 NOTE — ED Provider Notes (Signed)
History     CSN: [id number] Arrival date & time: 12/30/2010  5:46 AM   None     Chief Complaint  Patient presents with  . Medical Clearance  . Asthma    (Consider location/radiation/quality/duration/timing/severity/associated sxs/prior treatment) HPI Comments: 35 year old female who presents with chief complaint of "I need help with my drinking". She states that she has had heavy alcohol intake for many many years, had a two-year period of sobriety approximately 4 years ago but has been drinking more heavily lately. She drinks daily, beer, had 12 beers approximately 3 hours prior to arrival next with Cocaine and with Percocets.  He states that she rarely uses cocaine and has been using more opiate pills lately. Toes are gradually getting worse, currently they are severe but not associated suicidal thoughts. She has additional complaints of shortness of breath and states that she has chronic COPD, takes medications at home but is out of her steroid inhaler. She smokes cigarettes daily.  Patient is a 35 y.o. female presenting with alcohol problem. The history is provided by the patient and a friend.  Alcohol Problem This is a chronic problem. The current episode started more than 1 week ago. The problem occurs constantly. The problem has been gradually worsening. Associated symptoms include shortness of breath. Pertinent negatives include no chest pain and no abdominal pain. The symptoms are aggravated by nothing. The symptoms are relieved by nothing. Treatments tried: She has tried cold [place] in the past but has not tolerated this. Reports no history of withdrawal seizures or delirium tremens.    Past Medical History  Diagnosis Date  . COPD (chronic obstructive pulmonary disease)   . Asthma   . Depression     Past Surgical History  Procedure Date  . Back surgery   . Cesarean section   . Dilation and curettage of uterus     No family history on file.  History  Substance Use  Topics  . Smoking status: Current Everyday Smoker -- 1.0 packs/day    Types: Cigarettes  . Smokeless tobacco: Not on file  . Alcohol Use: 0.0 oz/week    12-18 Cans of beer per week    OB History    Grav Para Term Preterm Abortions TAB SAB Ect Mult Living                  Review of Systems  Respiratory: Positive for shortness of breath.   Cardiovascular: Negative for chest pain.  Gastrointestinal: Negative for abdominal pain.  All other systems reviewed and are negative.    Allergies  Review of patient's allergies indicates no known allergies.  Home Medications   Current Outpatient Rx  Name Route Sig Dispense Refill  . IPRATROPIUM-ALBUTEROL 18-103 MCG/ACT IN AERO Inhalation Inhale 2 puffs into the lungs every 6 (six) hours as needed.      Marland Kitchen FLUOXETINE HCL 20 MG PO CAPS Oral Take 20 mg by mouth daily.        BP 107/71  Pulse 110  Temp(Src) 97.4 F (36.3 C) (Oral)  Resp 22  Ht 5\' 5"  (1.651 m)  Wt 220 lb (99.791 kg)  BMI 36.61 kg/m2  SpO2 96%  LMP 12/15/2010  Physical Exam  Nursing note and vitals reviewed. Constitutional: She appears well-developed and well-nourished.       Tearful  HENT:  Head: Normocephalic and atraumatic.  Mouth/Throat: Oropharynx is clear and moist. No oropharyngeal exudate.  Eyes: Conjunctivae and EOM are normal. Pupils are equal, round, and reactive to  light. Right eye exhibits no discharge. Left eye exhibits no discharge. No scleral icterus.  Neck: Normal range of motion. Neck supple. No JVD present. No thyromegaly present.  Cardiovascular: Regular rhythm, normal heart sounds and intact distal pulses.  Exam reveals no gallop and no friction rub.   No murmur heard.      Tachycardia to 110  Pulmonary/Chest: Effort normal. No respiratory distress. She has wheezes ( Bilateral expiratory wheezing, speaks in full sentences, no increased work of breathing). She has no rales.  Abdominal: Soft. Bowel sounds are normal. She exhibits no distension  and no mass. There is no tenderness.  Musculoskeletal: Normal range of motion. She exhibits no edema and no tenderness.       Fatty lipoma to the dorsum of the left foot, nontender, nonerythematous  Lymphadenopathy:    She has no cervical adenopathy.  Neurological: She is alert. Coordination normal.  Skin: Skin is warm and dry. No rash noted. No erythema.  Psychiatric:       Mood depressed, currently intoxicated, positive substance abuse    ED Course  Procedures (including critical care time)   Labs Reviewed  CBC  COMPREHENSIVE METABOLIC PANEL  ETHANOL  URINE RAPID DRUG SCREEN (HOSP PERFORMED)  URINALYSIS, ROUTINE W REFLEX MICROSCOPIC  PREGNANCY, URINE   No results found.   No diagnosis found.    MDM  Patient presents with chronic but worsening substance abuse with depression. She has associated COPD and is undertreated due to being out of her medications. We'll start on albuterol and prednisone daily, will treat withdrawal symptoms with Ativan when necessary, consult psychiatric services for placement in inpatient detox.        Vida Roller, MD 01/01/11 [phone]

## 2010-12-30 NOTE — ED Notes (Signed)
Samson Frederic, with [facility] here to eval

## 2010-12-30 NOTE — ED Notes (Signed)
Patient seen and evaluated by [facility].  Accepted at [facility] for detox.  Friend is here to drive her.  C/o "fidgety", vitals stable.  Will dose additional ativan.  Glynn Octave, MD 12/30/10 [phone]

## 2010-12-30 NOTE — ED Notes (Signed)
Patient states she wants to get into detox for drinking ETOH.  Patient states she drinks everyday.  Patient also has audible wheezes noted.

## 2011-01-07 ENCOUNTER — Emergency Department (HOSPITAL_COMMUNITY): Payer: Self-pay

## 2011-01-07 ENCOUNTER — Inpatient Hospital Stay (HOSPITAL_COMMUNITY)
Admission: EM | Admit: 2011-01-07 | Discharge: 2011-01-09 | DRG: 192 | Disposition: A | Discharge status: Home / Self Care | Payer: Self-pay | Attending: Internal Medicine | Admitting: Internal Medicine

## 2011-01-07 ENCOUNTER — Encounter (HOSPITAL_COMMUNITY): Payer: Self-pay | Admitting: *Deleted

## 2011-01-07 DIAGNOSIS — F1210 Cannabis abuse, uncomplicated: Secondary | ICD-10-CM | POA: Diagnosis present

## 2011-01-07 DIAGNOSIS — Z720 Tobacco use: Secondary | ICD-10-CM | POA: Diagnosis present

## 2011-01-07 DIAGNOSIS — F3289 Other specified depressive episodes: Secondary | ICD-10-CM | POA: Diagnosis present

## 2011-01-07 DIAGNOSIS — J440 Chronic obstructive pulmonary disease with acute lower respiratory infection: Principal | ICD-10-CM | POA: Diagnosis present

## 2011-01-07 DIAGNOSIS — J209 Acute bronchitis, unspecified: Principal | ICD-10-CM | POA: Diagnosis present

## 2011-01-07 DIAGNOSIS — J441 Chronic obstructive pulmonary disease with (acute) exacerbation: Secondary | ICD-10-CM | POA: Diagnosis present

## 2011-01-07 DIAGNOSIS — F329 Major depressive disorder, single episode, unspecified: Secondary | ICD-10-CM | POA: Diagnosis present

## 2011-01-07 DIAGNOSIS — F17200 Nicotine dependence, unspecified, uncomplicated: Secondary | ICD-10-CM | POA: Diagnosis present

## 2011-01-07 DIAGNOSIS — J45909 Unspecified asthma, uncomplicated: Secondary | ICD-10-CM | POA: Diagnosis present

## 2011-01-07 DIAGNOSIS — F1910 Other psychoactive substance abuse, uncomplicated: Secondary | ICD-10-CM | POA: Diagnosis present

## 2011-01-07 DIAGNOSIS — F1410 Cocaine abuse, uncomplicated: Secondary | ICD-10-CM | POA: Diagnosis present

## 2011-01-07 DIAGNOSIS — F1010 Alcohol abuse, uncomplicated: Secondary | ICD-10-CM | POA: Diagnosis present

## 2011-01-07 MED ORDER — SODIUM CHLORIDE 0.9 % IV SOLN
INTRAVENOUS | Status: DC
  Administered 2011-01-08: via INTRAVENOUS

## 2011-01-07 MED ORDER — ALBUTEROL SULFATE (5 MG/ML) 0.5% IN NEBU
5.0000 mg | INHALATION_SOLUTION | Freq: Once | RESPIRATORY_TRACT | Status: AC
  Administered 2011-01-07: 5 mg via RESPIRATORY_TRACT

## 2011-01-07 MED ORDER — IPRATROPIUM BROMIDE 0.02 % IN SOLN
0.5000 mg | Freq: Once | RESPIRATORY_TRACT | Status: AC
  Administered 2011-01-07: 0.5 mg via RESPIRATORY_TRACT

## 2011-01-07 MED ORDER — HYDROCOD POLST-CHLORPHEN POLST 10-8 MG/5ML PO LQCR
5.0000 mL | Freq: Once | ORAL | Status: AC
  Administered 2011-01-08: 5 mL via ORAL
  Filled 2011-01-07: qty 5

## 2011-01-07 MED ORDER — ALBUTEROL SULFATE (5 MG/ML) 0.5% IN NEBU
INHALATION_SOLUTION | RESPIRATORY_TRACT | Status: AC
  Filled 2011-01-07: qty 1

## 2011-01-07 MED ORDER — METHYLPREDNISOLONE SODIUM SUCC 125 MG IJ SOLR
125.0000 mg | Freq: Once | INTRAMUSCULAR | Status: AC
  Administered 2011-01-08: 125 mg via INTRAVENOUS
  Filled 2011-01-07: qty 2

## 2011-01-07 MED ORDER — SODIUM CHLORIDE 0.9 % IV SOLN: Freq: Once | INTRAVENOUS | Status: DC

## 2011-01-07 MED ORDER — IPRATROPIUM BROMIDE 0.02 % IN SOLN
RESPIRATORY_TRACT | Status: AC
  Filled 2011-01-07: qty 2.5

## 2011-01-07 NOTE — ED Notes (Signed)
Pt reports sob over the past couple of days worsening tonight, reports neb tx pta did not help

## 2011-01-07 NOTE — ED Provider Notes (Signed)
History     CSN: [id number] Arrival date & time: 01/07/2011 11:02 PM   First MD Initiated Contact with Patient 01/07/11 2304      Chief Complaint  Patient presents with  . Shortness of Breath    (Consider location/radiation/quality/duration/timing/severity/associated sxs/prior treatment) Patient is a 35 y.o. female presenting with shortness of breath. The history is provided by the patient.  Shortness of Breath  The current episode started 2 days ago. The problem occurs frequently. The problem has been gradually worsening. The problem is severe. The symptoms are relieved by nothing. The symptoms are aggravated by nothing. Associated symptoms include chest pain, cough, shortness of breath and wheezing. Pertinent negatives include no fever, no sore throat and no stridor. There was no intake of a foreign body. She was not exposed to toxic fumes. She has not inhaled smoke recently. She has had prior hospitalizations. She has had no prior ICU admissions. She has had no prior intubations. Past medical history comments: copd. Urine output has been normal. The last void occurred 6 to 12 hours ago. There were no sick contacts. She has received no recent medical care.    Past Medical History  Diagnosis Date  . COPD (chronic obstructive pulmonary disease)   . Asthma   . Depression     Past Surgical History  Procedure Date  . Back surgery   . Cesarean section   . Dilation and curettage of uterus     No family history on file.  History  Substance Use Topics  . Smoking status: Current Everyday Smoker -- 1.0 packs/day    Types: Cigarettes  . Smokeless tobacco: Not on file  . Alcohol Use: 0.0 oz/week    12-18 Cans of beer per week    OB History    Grav Para Term Preterm Abortions TAB SAB Ect Mult Living                  Review of Systems  Constitutional: Negative for fever and activity change.       All ROS Neg except as noted in HPI  HENT: Negative for nosebleeds, sore throat  and neck pain.   Eyes: Negative for photophobia and discharge.  Respiratory: Positive for cough, shortness of breath and wheezing. Negative for stridor.   Cardiovascular: Positive for chest pain. Negative for palpitations.  Gastrointestinal: Negative for abdominal pain and blood in stool.  Genitourinary: Negative for dysuria, frequency and hematuria.  Musculoskeletal: Negative for back pain and arthralgias.  Skin: Negative.   Neurological: Negative for dizziness, seizures and speech difficulty.  Psychiatric/Behavioral: Negative for hallucinations and confusion.    Allergies  Review of patient's allergies indicates no known allergies.  Home Medications   Current Outpatient Rx  Name Route Sig Dispense Refill  . ALBUTEROL SULFATE HFA 108 (90 BASE) MCG/ACT IN AERS Inhalation Inhale 2 puffs into the lungs every 6 (six) hours as needed. Shortness of breath     . FLUOXETINE HCL 20 MG PO CAPS Oral Take 20 mg by mouth daily.      . MOMETASONE FURO-FORMOTEROL FUM 100-5 MCG/ACT IN AERO Inhalation Inhale 2 puffs into the lungs 2 (two) times daily.        BP 117/64  Pulse 117  Temp(Src) 98.6 F (37 C) (Oral)  Resp 24  Ht 5\' 5"  (1.651 m)  Wt 220 lb (99.791 kg)  BMI 36.61 kg/m2  SpO2 97%  LMP 12/15/2010  Physical Exam  Nursing note and vitals reviewed. Constitutional: She is oriented to  person, place, and time. She appears well-developed and well-nourished.  Non-toxic appearance.  HENT:  Head: Normocephalic.  Right Ear: Tympanic membrane and external ear normal.  Left Ear: Tympanic membrane and external ear normal.  Eyes: EOM and lids are normal. Pupils are equal, round, and reactive to light.  Neck: Normal range of motion. Neck supple. Carotid bruit is not present.  Cardiovascular: Regular rhythm, normal heart sounds, intact distal pulses and normal pulses.  Tachycardia present.   Pulmonary/Chest: Tachypnea noted. No respiratory distress. She has wheezes. She has rhonchi.  Abdominal:  Soft. Bowel sounds are normal. There is no tenderness. There is no guarding.  Musculoskeletal: Normal range of motion.  Lymphadenopathy:       Head (right side): No submandibular adenopathy present.       Head (left side): No submandibular adenopathy present.    She has no cervical adenopathy.  Neurological: She is alert and oriented to person, place, and time. She has normal strength. No cranial nerve deficit or sensory deficit.  Skin: Skin is warm and dry.  Psychiatric: She has a normal mood and affect. Her speech is normal.    ED Course: 11:32 Pt continues to have tachypnea and wheezing with some assessory muscle use. 2nd neb treatment given. 12:28 After 2nd neb with albuterol, wheezing improved, but pt still having some labored respiration. 3rd neb ordered. 1:30am Pt continues to have wheezing and labored respirations. Pulse ox 90 to 92% on room air. Will discuss with hospitalist for admission.  Dr Earney Mallet will see pt for admission.  Procedures (including critical care time)  Labs Reviewed - No data to display No results found.   Dx: COPD exacerbation   MDM  I have reviewed nursing notes, vital signs, and all appropriate lab and imaging results for this patient.  Results for orders placed during the hospital encounter of 12/30/10  CBC      Component Value Range   WBC 9.1  4.0 - 10.5 (K/uL)   RBC 4.29  3.87 - 5.11 (MIL/uL)   Hemoglobin 10.7 (*) 12.0 - 15.0 (g/dL)   HCT 16.1 (*) 09.6 - 46.0 (%)   MCV 80.2  78.0 - 100.0 (fL)   MCH 24.9 (*) 26.0 - 34.0 (pg)   MCHC 31.1  30.0 - 36.0 (g/dL)   RDW 04.5  40.9 - 81.1 (%)   Platelets 321  150 - 400 (K/uL)  COMPREHENSIVE METABOLIC PANEL      Component Value Range   Sodium 136  135 - 145 (mEq/L)   Potassium 3.8  3.5 - 5.1 (mEq/L)   Chloride 102  96 - 112 (mEq/L)   CO2 26  19 - 32 (mEq/L)   Glucose, Bld 119 (*) 70 - 99 (mg/dL)   BUN 7  6 - 23 (mg/dL)   Creatinine, Ser 9.14  0.50 - 1.10 (mg/dL)   Calcium 9.3  8.4 - 78.2 (mg/dL)    Total Protein 7.4  6.0 - 8.3 (g/dL)   Albumin 3.9  3.5 - 5.2 (g/dL)   AST 14  0 - 37 (U/L)   ALT 12  0 - 35 (U/L)   Alkaline Phosphatase 106  39 - 117 (U/L)   Total Bilirubin 0.3  0.3 - 1.2 (mg/dL)   GFR calc non Af Amer >90  >90 (mL/min)   GFR calc Af Amer >90  >90 (mL/min)  ETHANOL      Component Value Range   Alcohol, Ethyl (B) <11  0 - 11 (mg/dL)  URINE RAPID DRUG  SCREEN (HOSP PERFORMED)      Component Value Range   Opiates NONE DETECTED  NONE DETECTED    Cocaine POSITIVE (*) NONE DETECTED    Benzodiazepines NONE DETECTED  NONE DETECTED    Amphetamines NONE DETECTED  NONE DETECTED    Tetrahydrocannabinol NONE DETECTED  NONE DETECTED    Barbiturates NONE DETECTED  NONE DETECTED   URINALYSIS, ROUTINE W REFLEX MICROSCOPIC      Component Value Range   Color, Urine YELLOW  YELLOW    Appearance CLEAR  CLEAR    Specific Gravity, Urine 1.010  1.005 - 1.030    pH 6.0  5.0 - 8.0    Glucose, UA NEGATIVE  NEGATIVE (mg/dL)   Hgb urine dipstick SMALL (*) NEGATIVE    Bilirubin Urine NEGATIVE  NEGATIVE    Ketones, ur NEGATIVE  NEGATIVE (mg/dL)   Protein, ur NEGATIVE  NEGATIVE (mg/dL)   Urobilinogen, UA 0.2  0.0 - 1.0 (mg/dL)   Nitrite NEGATIVE  NEGATIVE    Leukocytes, UA NEGATIVE  NEGATIVE   PREGNANCY, URINE      Component Value Range   Preg Test, Ur NEGATIVE    URINE MICROSCOPIC-ADD ON      Component Value Range   Squamous Epithelial / LPF FEW (*) RARE    RBC / HPF 3-6  <3 (RBC/hpf)   Bacteria, UA FEW (*) RARE    Dg Chest Portable 1 View  01/07/2011  *RADIOLOGY REPORT*  Clinical Data: Shortness of breath, congestion, history COPD  PORTABLE CHEST - 1 VIEW  Comparison: Portable exam 2320 hours compared to 01/07/2010  Findings: Normal heart size, mediastinal contours, and pulmonary vascularity. Minimal atelectasis at minor fissure. Lungs otherwise clear. No pleural effusion or pneumothorax. Bones unremarkable.  IMPRESSION: Minimal atelectasis at minor fissure.  Original Report  Authenticated By: Lollie Marrow, [name]         Kathie Dike, [place] 01/08/11 [phone]

## 2011-01-08 ENCOUNTER — Encounter (HOSPITAL_COMMUNITY): Payer: Self-pay | Admitting: Internal Medicine

## 2011-01-08 DIAGNOSIS — J441 Chronic obstructive pulmonary disease with (acute) exacerbation: Secondary | ICD-10-CM | POA: Diagnosis present

## 2011-01-08 DIAGNOSIS — J209 Acute bronchitis, unspecified: Secondary | ICD-10-CM | POA: Diagnosis present

## 2011-01-08 DIAGNOSIS — F1910 Other psychoactive substance abuse, uncomplicated: Secondary | ICD-10-CM | POA: Diagnosis present

## 2011-01-08 DIAGNOSIS — F329 Major depressive disorder, single episode, unspecified: Secondary | ICD-10-CM | POA: Diagnosis present

## 2011-01-08 DIAGNOSIS — Z720 Tobacco use: Secondary | ICD-10-CM | POA: Diagnosis present

## 2011-01-08 DIAGNOSIS — J45909 Unspecified asthma, uncomplicated: Secondary | ICD-10-CM | POA: Diagnosis present

## 2011-01-08 DIAGNOSIS — F1010 Alcohol abuse, uncomplicated: Secondary | ICD-10-CM | POA: Diagnosis present

## 2011-01-08 LAB — COMPREHENSIVE METABOLIC PANEL
CO2: 21 mEq/L (ref 19–32)
Calcium: 9.3 mg/dL (ref 8.4–10.5)
Creatinine, Ser: 0.68 mg/dL (ref 0.50–1.10)
GFR calc Af Amer: >90 mL/min (ref >90)
GFR calc non Af Amer: >90 mL/min (ref >90)
Glucose, Bld: 131 mg/dL — ABNORMAL HIGH (ref 70–99)

## 2011-01-08 LAB — RAPID URINE DRUG SCREEN, HOSP PERFORMED
Amphetamines: NOT DETECTED
Cocaine: NOT DETECTED
Opiates: NOT DETECTED

## 2011-01-08 LAB — CBC
Hemoglobin: 10.6 g/dL — ABNORMAL LOW (ref 12.0–15.0)
MCH: 24.8 pg — ABNORMAL LOW (ref 26.0–34.0)
MCV: 80.8 fL (ref 78.0–100.0)
RBC: 4.28 MIL/uL (ref 3.87–5.11)

## 2011-01-08 LAB — TSH: TSH: 4.476 u[IU]/mL (ref 0.350–4.500)

## 2011-01-08 MED ORDER — THIAMINE HCL 100 MG/ML IJ SOLN: 100.0000 mg | Freq: Every day | INTRAMUSCULAR | Status: DC

## 2011-01-08 MED ORDER — LORAZEPAM 2 MG/ML IJ SOLN: 1.0000 mg | Freq: Four times a day (QID) | INTRAMUSCULAR | Status: DC | PRN

## 2011-01-08 MED ORDER — ACETAMINOPHEN 325 MG PO TABS
650.0000 mg | ORAL_TABLET | Freq: Four times a day (QID) | ORAL | Status: DC | PRN
  Administered 2011-01-08: 650 mg via ORAL
  Filled 2011-01-08: qty 2

## 2011-01-08 MED ORDER — SENNA 8.6 MG PO TABS: 2.0000 | ORAL_TABLET | Freq: Every day | ORAL | Status: DC | PRN

## 2011-01-08 MED ORDER — BENZONATATE 100 MG PO CAPS: 100.0000 mg | ORAL_CAPSULE | Freq: Three times a day (TID) | ORAL | Status: DC | PRN

## 2011-01-08 MED ORDER — IPRATROPIUM BROMIDE 0.02 % IN SOLN
0.5000 mg | RESPIRATORY_TRACT | Status: DC
  Administered 2011-01-08 (×2): 0.5 mg via RESPIRATORY_TRACT
  Filled 2011-01-08 (×2): qty 2.5

## 2011-01-08 MED ORDER — THERA M PLUS PO TABS
1.0000 | ORAL_TABLET | Freq: Every day | ORAL | Status: DC
  Administered 2011-01-08 – 2011-01-09 (×2): 1 via ORAL
  Filled 2011-01-08 (×2): qty 1

## 2011-01-08 MED ORDER — MOXIFLOXACIN HCL IN NACL 400 MG/250ML IV SOLN
400.0000 mg | INTRAVENOUS | Status: DC
  Administered 2011-01-08: 400 mg via INTRAVENOUS
  Filled 2011-01-08 (×2): qty 250

## 2011-01-08 MED ORDER — MOMETASONE FURO-FORMOTEROL FUM 100-5 MCG/ACT IN AERO
2.0000 | INHALATION_SPRAY | Freq: Two times a day (BID) | RESPIRATORY_TRACT | Status: DC
  Administered 2011-01-08 – 2011-01-09 (×3): 2 via RESPIRATORY_TRACT
  Filled 2011-01-08: qty 13

## 2011-01-08 MED ORDER — LORAZEPAM 1 MG PO TABS: 0.0000 mg | ORAL_TABLET | Freq: Two times a day (BID) | ORAL | Status: DC

## 2011-01-08 MED ORDER — ENOXAPARIN SODIUM 40 MG/0.4ML ~~LOC~~ SOLN
40.0000 mg | SUBCUTANEOUS | Status: DC
  Administered 2011-01-08 – 2011-01-09 (×2): 40 mg via SUBCUTANEOUS
  Filled 2011-01-08 (×2): qty 0.4

## 2011-01-08 MED ORDER — FOLIC ACID 1 MG PO TABS
1.0000 mg | ORAL_TABLET | Freq: Every day | ORAL | Status: DC
  Administered 2011-01-08 – 2011-01-09 (×2): 1 mg via ORAL
  Filled 2011-01-08 (×2): qty 1

## 2011-01-08 MED ORDER — ONDANSETRON HCL 4 MG PO TABS: 4.0000 mg | ORAL_TABLET | Freq: Four times a day (QID) | ORAL | Status: DC | PRN

## 2011-01-08 MED ORDER — ACETAMINOPHEN 650 MG RE SUPP: 650.0000 mg | Freq: Four times a day (QID) | RECTAL | Status: DC | PRN

## 2011-01-08 MED ORDER — METHYLPREDNISOLONE SODIUM SUCC 125 MG IJ SOLR
80.0000 mg | Freq: Four times a day (QID) | INTRAMUSCULAR | Status: DC
  Administered 2011-01-08 (×2): 80 mg via INTRAVENOUS
  Filled 2011-01-08: qty 2

## 2011-01-08 MED ORDER — ONDANSETRON HCL 4 MG/2ML IJ SOLN: 4.0000 mg | Freq: Four times a day (QID) | INTRAMUSCULAR | Status: DC | PRN

## 2011-01-08 MED ORDER — ALBUTEROL SULFATE (5 MG/ML) 0.5% IN NEBU: 2.5000 mg | INHALATION_SOLUTION | RESPIRATORY_TRACT | Status: DC | PRN

## 2011-01-08 MED ORDER — LORAZEPAM 1 MG PO TABS: 1.0000 mg | ORAL_TABLET | Freq: Four times a day (QID) | ORAL | Status: DC | PRN

## 2011-01-08 MED ORDER — IPRATROPIUM BROMIDE 0.02 % IN SOLN
0.5000 mg | Freq: Four times a day (QID) | RESPIRATORY_TRACT | Status: DC
  Administered 2011-01-08 – 2011-01-09 (×5): 0.5 mg via RESPIRATORY_TRACT
  Filled 2011-01-08 (×6): qty 2.5

## 2011-01-08 MED ORDER — MOXIFLOXACIN HCL IN NACL 400 MG/250ML IV SOLN
INTRAVENOUS | Status: AC
  Filled 2011-01-08: qty 250

## 2011-01-08 MED ORDER — NICOTINE 14 MG/24HR TD PT24
14.0000 mg | MEDICATED_PATCH | Freq: Every day | TRANSDERMAL | Status: DC
  Administered 2011-01-08: 14 mg via TRANSDERMAL
  Filled 2011-01-08 (×2): qty 1

## 2011-01-08 MED ORDER — PREDNISONE 20 MG PO TABS
60.0000 mg | ORAL_TABLET | Freq: Two times a day (BID) | ORAL | Status: DC
  Administered 2011-01-08 – 2011-01-09 (×3): 60 mg via ORAL
  Filled 2011-01-08 (×3): qty 3

## 2011-01-08 MED ORDER — ALBUTEROL SULFATE (5 MG/ML) 0.5% IN NEBU
2.5000 mg | INHALATION_SOLUTION | RESPIRATORY_TRACT | Status: DC
  Administered 2011-01-08 – 2011-01-09 (×8): 2.5 mg via RESPIRATORY_TRACT
  Filled 2011-01-08 (×9): qty 0.5

## 2011-01-08 MED ORDER — ALBUTEROL SULFATE (5 MG/ML) 0.5% IN NEBU
5.0000 mg | INHALATION_SOLUTION | Freq: Once | RESPIRATORY_TRACT | Status: AC
  Administered 2011-01-08: 5 mg via RESPIRATORY_TRACT
  Filled 2011-01-08: qty 1

## 2011-01-08 MED ORDER — VITAMIN B-1 100 MG PO TABS
100.0000 mg | ORAL_TABLET | Freq: Every day | ORAL | Status: DC
  Administered 2011-01-08 – 2011-01-09 (×2): 100 mg via ORAL
  Filled 2011-01-08 (×2): qty 1

## 2011-01-08 MED ORDER — SODIUM CHLORIDE 0.45 % IV SOLN
INTRAVENOUS | Status: DC
  Administered 2011-01-08: 03:00:00 via INTRAVENOUS

## 2011-01-08 MED ORDER — MOXIFLOXACIN HCL 400 MG PO TABS
400.0000 mg | ORAL_TABLET | Freq: Every day | ORAL | Status: DC
  Administered 2011-01-08: 400 mg via ORAL
  Filled 2011-01-08: qty 1

## 2011-01-08 MED ORDER — FLUOXETINE HCL 20 MG PO CAPS
20.0000 mg | ORAL_CAPSULE | Freq: Every day | ORAL | Status: DC
  Administered 2011-01-08: 20 mg via ORAL
  Filled 2011-01-08 (×2): qty 1

## 2011-01-08 MED ORDER — SENNOSIDES-DOCUSATE SODIUM 8.6-50 MG PO TABS: 1.0000 | ORAL_TABLET | Freq: Every day | ORAL | Status: DC | PRN

## 2011-01-08 MED ORDER — LORAZEPAM 1 MG PO TABS
0.0000 mg | ORAL_TABLET | Freq: Four times a day (QID) | ORAL | Status: DC
  Filled 2011-01-08: qty 1

## 2011-01-08 NOTE — H&P (Signed)
[name] is an 35 y.o. female.    PCP: Kari Deiters, MD   Chief Complaint: Shortness of breath for a week  HPI: This is a 35 year old, Caucasian Caucasian female, with a past medical history of asthma, COPD, who unfortunately, continues to smoke, who was in her usual state of health till one week ago, when she started having shortness of breath. She had a fever up to 102 degrees Fahrenheit. She took some Tylenol and the fever resolved. However, her shortness of breath, continued to persist. She had wheezing. She had a dry cough. She had chest pain from the cough, which is now better. She denies any sick contacts. No recent travel outside dysuria. No nausea, vomiting. She tried taking nebulizer treatments at home with no relief. Denies being on antibiotics recently. However, she tells me, that she was on prednisone about 2 weeks ago. Denies any leg swelling.   Prior to Admission medications   Medication Sig Start Date End Date Taking? Authorizing Provider  albuterol (PROVENTIL HFA;VENTOLIN HFA) 108 (90 BASE) MCG/ACT inhaler Inhale 2 puffs into the lungs every 6 (six) hours as needed. Shortness of breath     Historical Provider, MD  FLUoxetine (PROZAC) 20 MG capsule Take 20 mg by mouth daily.      Historical Provider, MD  mometasone-formoterol (DULERA) 100-5 MCG/ACT AERO Inhale 2 puffs into the lungs 2 (two) times daily.      Historical Provider, MD    Allergies: No Known Allergies  Past Medical History  Diagnosis Date  . COPD (chronic obstructive pulmonary disease)   . Asthma   . Depression     Past Surgical History  Procedure Date  . Back surgery   . Cesarean section   . Dilation and curettage of uterus     Social History:  reports that she has been smoking Cigarettes.  She has been smoking about 1 pack per day. She does not have any smokeless tobacco history on file. She reports that she drinks alcohol. She reports that she uses illicit drugs ("Crack" cocaine and  Marijuana).  Family History:  Family History  Problem Relation Age of Onset  . Hypothyroidism Mother   . Coronary artery disease Mother     Review of Systems - History obtained from the patient General ROS: negative Psychological ROS: negative ENT ROS: negative Endocrine ROS: negative Respiratory ROS: As in HPI Cardiovascular ROS: As in HPI Gastrointestinal ROS: negative Genito-Urinary ROS: negative Musculoskeletal ROS: negative Neurological ROS: negative Dermatological ROS: negative  Physical Examination Blood pressure 117/64, pulse 117, temperature 98.6 F (37 C), temperature source Oral, resp. rate 24, height 5\' 5"  (1.651 m), weight 99.791 kg (220 lb), last menstrual period 12/15/2010, SpO2 96.00%.  General appearance: alert, cooperative, appears older than stated age and no distress Head: Normocephalic, without obvious abnormality, atraumatic Eyes: conjunctivae/corneas clear. PERRL, EOM's intact. Fundi benign. Throat: lips, mucosa, and tongue normal; teeth and gums normal Neck: no adenopathy, no carotid bruit, no JVD, supple, symmetrical, trachea midline and thyroid not enlarged, symmetric, no tenderness/mass/nodules Resp: diminished breath sounds bibasilar and wheezes LLL, LUL, RLL, RML and RUL Cardio: regular rate and rhythm, S1, S2 normal, no murmur, click, rub or gallop GI: soft, mild tenderness in RUQ. No rebound. No masses or organomegaly. BS present and normal. Extremities: extremities normal, atraumatic, no cyanosis or edema Pulses: 2+ and symmetric Skin: Skin color, texture, turgor normal. No rashes or lesions Lymph nodes: Cervical, supraclavicular, and axillary nodes normal. Neurologic: Alert and oriented X 3, normal strength  and tone. Normal symmetric reflexes. Normal coordination and gait  No results found for this or any previous visit (from the past 48 hour(s)). Dg Chest Portable 1 View  01/07/2011  *RADIOLOGY REPORT*  Clinical Data: Shortness of breath,  congestion, history COPD  PORTABLE CHEST - 1 VIEW  Comparison: Portable exam 2320 hours compared to 01/07/2010  Findings: Normal heart size, mediastinal contours, and pulmonary vascularity. Minimal atelectasis at minor fissure. Lungs otherwise clear. No pleural effusion or pneumothorax. Bones unremarkable.  IMPRESSION: Minimal atelectasis at minor fissure.  Original Report Authenticated By: Lollie Marrow, [name]     Assessment/Plan  Principal Problem:  *Acute bronchitis Active Problems:  Asthma  COPD (chronic obstructive pulmonary disease)  Alcohol abuse  Tobacco abuse  Drug abuse    #1 Acute bronchitis: This will be treated with nebulizer treatments, steroids, antibiotics. Smoking cessation was emphasized. Chest pain is from coughing spells. Should improve as her bronchitis improves.  #2 alcohol abuse: Patient will put on a CIWA protocol. Thiamine will be given. Counseling will be provided.  #3 tobacco abuse: Nicotine patch will be provided. Counseling has been done.  #4 polysubstance drug abuse including marijuana and cocaine: Counseling will be provided.  Labs will be checked immediately.  DVT, prophylaxis will be initiated. She's a full code.  Further management decisions will depend on results of further testing and patient's response to treatment.  Omere Marti 01/08/2011, 2:01 AM

## 2011-01-08 NOTE — ED Provider Notes (Signed)
Medical screening examination/treatment/procedure(s) were performed by non-physician practitioner and as supervising physician I was immediately available for consultation/collaboration.   Hanley Seamen, MD 01/08/11 [phone]

## 2011-01-08 NOTE — Progress Notes (Signed)
Chart reviewed  Subjective: Feels better today, but didn't get any rest. Still wheezing. Has not ambulated. Has tried to quit smoking in the past. Denies any signs of withdrawal.  Objective: Vital signs in last 24 hours: Filed Vitals:   01/08/11 0418 01/08/11 0537 01/08/11 0712 01/08/11 0845  BP:  103/68  103/68  Pulse:  95  95  Temp:  97.7 F (36.5 C)    TempSrc:  Oral    Resp:      Height:      Weight:      SpO2: 94% 93% 94%    Weight change:  No intake or output data in the 24 hours ending 01/08/11 1022 Physical Exam: Gen.: Talking on the phone watching TV. Coughing frequently. Able to speak in full sentences. Lungs poor inspiratory effort. Wheezing throughout with rhonchi. Cardiovascular regular rate rhythm without murmurs gallops rubs Abdomen obese soft nontender nondistended Extremities no clubbing cyanosis or edema Neurologic alert and oriented. No focal deficits. Psychiatric: Calm cooperative and pleasant.  Lab Results: Basic Metabolic Panel:  Lab 01/08/11 4098 01/08/11 0330  NA 132* --  K 3.8 --  CL 103 --  CO2 21 --  GLUCOSE 131* --  BUN 7 --  CREATININE 0.68 --  CALCIUM 9.3 --  MG -- 1.9  PHOS -- --   Liver Function Tests:  Lab 01/08/11 0331  AST 13  ALT 13  ALKPHOS 87  BILITOT 0.1*  PROT 7.1  ALBUMIN 3.6   No results found for this basename: LIPASE:2,AMYLASE:2 in the last 168 hours No results found for this basename: AMMONIA:2 in the last 168 hours CBC:  Lab 01/08/11 0331  WBC 10.4  NEUTROABS --  HGB 10.6*  HCT 34.6*  MCV 80.8  PLT 299   Cardiac Enzymes: No results found for this basename: CKTOTAL:3,CKMB:3,CKMBINDEX:3,TROPONINI:3 in the last 168 hours BNP: No results found for this basename: POCBNP:3 in the last 168 hours D-Dimer: No results found for this basename: DDIMER:2 in the last 168 hours CBG: No results found for this basename: GLUCAP:6 in the last 168 hours Hemoglobin A1C: No results found for this basename: HGBA1C in  the last 168 hours Fasting Lipid Panel: No results found for this basename: CHOL,HDL,LDLCALC,TRIG,CHOLHDL,LDLDIRECT in the last 119 hours Thyroid Function Tests: No results found for this basename: TSH,T4TOTAL,FREET4,T3FREE,THYROIDAB in the last 168 hours Coagulation: No results found for this basename: LABPROT:4,INR:4 in the last 168 hours Anemia Panel: No results found for this basename: VITAMINB12,FOLATE,FERRITIN,TIBC,IRON,RETICCTPCT in the last 168 hours Urine Drug Screen: Drugs of Abuse     Component Value Date/Time   LABOPIA NONE DETECTED 01/08/2011 0415   COCAINSCRNUR NONE DETECTED 01/08/2011 0415   LABBENZ NONE DETECTED 01/08/2011 0415   AMPHETMU NONE DETECTED 01/08/2011 0415   THCU NONE DETECTED 01/08/2011 0415   LABBARB NONE DETECTED 01/08/2011 0415   Scheduled Meds:   . albuterol  2.5 mg Nebulization Q4H  . albuterol  5 mg Nebulization Once  . albuterol  5 mg Nebulization Once  . albuterol  5 mg Nebulization Once  . albuterol      . albuterol      . chlorpheniramine-HYDROcodone  5 mL Oral Once  . enoxaparin  40 mg Subcutaneous Q24H  . FLUoxetine  20 mg Oral Daily  . folic acid  1 mg Oral Daily  . ipratropium      . ipratropium  0.5 mg Nebulization Once  . ipratropium  0.5 mg Nebulization QID  . LORazepam  0-4 mg Oral Q6H  Followed by  . LORazepam  0-4 mg Oral Q12H  . methylPREDNISolone sodium succinate  125 mg Intravenous Once  . mometasone-formoterol  2 puff Inhalation BID  . moxifloxacin  400 mg Oral q1800  . multivitamins ther. w/minerals  1 tablet Oral Daily  . nicotine  14 mg Transdermal Daily  . predniSONE  60 mg Oral BID  . thiamine  100 mg Oral Daily   Or  . thiamine  100 mg Intravenous Daily  . DISCONTD: sodium chloride   Intravenous Once  . DISCONTD: ipratropium  0.5 mg Nebulization Q4H  . DISCONTD: methylPREDNISolone (SOLU-MEDROL) injection  80 mg Intravenous Q6H  . DISCONTD: moxifloxacin  400 mg Intravenous Q24H   Continuous Infusions:   .  DISCONTD: sodium chloride 100 mL/hr at 01/08/11 0304  . DISCONTD: sodium chloride 125 mL/hr at 01/08/11 0005   PRN Meds:.acetaminophen, albuterol, benzonatate, LORazepam, LORazepam, ondansetron (ZOFRAN) IV, ondansetron, senna, senna-docusate, DISCONTD: acetaminophen  Assessment/Plan: Principal Problem:  *Acute bronchitis Active Problems:  COPD exacerbation  Polysubstance abuse  Tobacco abuse  Depression  Patient is improving. Change antibiotics and steroids to by mouth. Discontinue telemetry and IV fluids. Increase activity today. Hopefully home tomorrow if continues to improve. Needs to quit smoking and using drugs.  LOS: 1 day   Keven Osborn L 01/08/2011, 10:22 AM

## 2011-01-09 MED ORDER — PREDNISONE (PAK) 10 MG PO TABS: 10.0000 mg | ORAL_TABLET | Freq: Every day | ORAL | Status: AC

## 2011-01-09 MED ORDER — ALBUTEROL SULFATE HFA 108 (90 BASE) MCG/ACT IN AERS: 2.0000 | INHALATION_SPRAY | RESPIRATORY_TRACT | Status: DC | PRN

## 2011-01-09 MED ORDER — LEVOFLOXACIN 500 MG PO TABS: 500.0000 mg | ORAL_TABLET | Freq: Every day | ORAL | Status: AC

## 2011-01-09 NOTE — Discharge Summary (Signed)
Physician Discharge Summary  Patient ID: [name] MRN: [id number] DOB/AGE: 04-28-1975 35 y.o.  Admit date: 01/07/2011 Discharge date: 01/09/2011  Discharge Diagnoses:  Principal Problem:  *Acute bronchitis Active Problems:  COPD exacerbation  Polysubstance abuse  Tobacco abuse  Depression   Current Discharge Medication List    START taking these medications   Details  levofloxacin (LEVAQUIN) 500 MG tablet Take 1 tablet (500 mg total) by mouth daily. Qty: 4 tablet, Refills: 0    predniSONE (STERAPRED UNI-PAK) 10 MG tablet Take 1 tablet (10 mg total) by mouth daily. 4 tablets daily then decrease by 1 tablet every 2 days until off Qty: 20 tablet, Refills: 0      CONTINUE these medications which have CHANGED   Details  albuterol (PROVENTIL HFA;VENTOLIN HFA) 108 (90 BASE) MCG/ACT inhaler Inhale 2 puffs into the lungs every 4 (four) hours as needed for wheezing or shortness of breath. Shortness of breath      CONTINUE these medications which have NOT CHANGED   Details  FLUoxetine (PROZAC) 20 MG capsule Take 20 mg by mouth daily.      mometasone-formoterol (DULERA) 100-5 MCG/ACT AERO Inhale 2 puffs into the lungs 2 (two) times daily.          Discharge Orders    Future Orders Please Complete By Expires   Diet general      Increase activity slowly      Discharge instructions      Comments:   Quit smoking. Drink plenty of fluids      Follow-up Information    Follow up with HASANAJ,XAJE A. (If symptoms worsen)          Disposition: Home or Self Care  Discharged Condition: Stable  Consults:  none  Labs:   Results for orders placed during the hospital encounter of 01/07/11 (from the past 48 hour(s))  MAGNESIUM     Status: Normal   Collection Time   01/08/11  3:30 AM      Component Value Range Comment   Magnesium 1.9  1.5 - 2.5 (mg/dL)   TSH     Status: Normal   Collection Time   01/08/11  3:30 AM      Component Value Range Comment   TSH 4.476   0.350 - 4.500 (uIU/mL)   CBC     Status: Abnormal   Collection Time   01/08/11  3:31 AM      Component Value Range Comment   WBC 10.4  4.0 - 10.5 (K/uL)    RBC 4.28  3.87 - 5.11 (MIL/uL)    Hemoglobin 10.6 (*) 12.0 - 15.0 (g/dL)    HCT 56.2 (*) 13.0 - 46.0 (%)    MCV 80.8  78.0 - 100.0 (fL)    MCH 24.8 (*) 26.0 - 34.0 (pg)    MCHC 30.6  30.0 - 36.0 (g/dL)    RDW 86.5  78.4 - 69.6 (%)    Platelets 299  150 - 400 (K/uL)   COMPREHENSIVE METABOLIC PANEL     Status: Abnormal   Collection Time   01/08/11  3:31 AM      Component Value Range Comment   Sodium 132 (*) 135 - 145 (mEq/L)    Potassium 3.8  3.5 - 5.1 (mEq/L)    Chloride 103  96 - 112 (mEq/L)    CO2 21  19 - 32 (mEq/L)    Glucose, Bld 131 (*) 70 - 99 (mg/dL)    BUN 7  6 - 23 (  mg/dL)    Creatinine, Ser 1.61  0.50 - 1.10 (mg/dL)    Calcium 9.3  8.4 - 10.5 (mg/dL)    Total Protein 7.1  6.0 - 8.3 (g/dL)    Albumin 3.6  3.5 - 5.2 (g/dL)    AST 13  0 - 37 (U/L)    ALT 13  0 - 35 (U/L)    Alkaline Phosphatase 87  39 - 117 (U/L)    Total Bilirubin 0.1 (*) 0.3 - 1.2 (mg/dL)    GFR calc non Af Amer >90  >90 (mL/min)    GFR calc Af Amer >90  >90 (mL/min)   URINE RAPID DRUG SCREEN (HOSP PERFORMED)     Status: Normal   Collection Time   01/08/11  4:15 AM      Component Value Range Comment   Opiates NONE DETECTED  NONE DETECTED     Cocaine NONE DETECTED  NONE DETECTED     Benzodiazepines NONE DETECTED  NONE DETECTED     Amphetamines NONE DETECTED  NONE DETECTED     Tetrahydrocannabinol NONE DETECTED  NONE DETECTED     Barbiturates NONE DETECTED  NONE DETECTED      Diagnostics:  Dg Chest Portable 1 View  01/07/2011  *RADIOLOGY REPORT*  Clinical Data: Shortness of breath, congestion, history COPD  PORTABLE CHEST - 1 VIEW  Comparison: Portable exam 2320 hours compared to 01/07/2010  Findings: Normal heart size, mediastinal contours, and pulmonary vascularity. Minimal atelectasis at minor fissure. Lungs otherwise clear. No pleural  effusion or pneumothorax. Bones unremarkable.  IMPRESSION: Minimal atelectasis at minor fissure.  Original Report Authenticated By: Lollie Marrow, [name]   Full Code   Hospital Course:  See H&P for admission details.  The patient is a 35 year old smoker who presents with fever, cough, wheeze, and dyspnea. She also smokes crack and marijuana. En the ED, she was tachycardic and had a respiratory rate of 24, otherwise normal vitals.  She had wheezes throughout.  CXR showed no pneumonia.  She was admitted, started on steroids, antibiotics, oxygen, bronchodilators and supportive care.  At the time or discharge, she was feeling better, ambulating, and requesting discharge.  Discharge Exam:  Blood pressure 102/69, pulse 102, temperature 97.9 F (36.6 C), temperature source Oral, resp. rate 20, height 5\' 5"  (1.651 m), weight 101.878 kg (224 lb 9.6 oz), last menstrual period 12/15/2010, SpO2 93.00%.  Exam unchanged from 01/08/11  Signed: Crista Curb L 01/09/2011, 11:25 AM

## 2011-01-09 NOTE — Progress Notes (Signed)
CSW attempted to meet with pt to discuss substance abuse.  However, pt refused to complete assessment and stated she was just ready to leave.  [name]

## 2011-01-11 NOTE — Progress Notes (Signed)
CARE MANAGEMENT NOTE 01/11/2011  Patient:  [name], [name]   Account Number:  [id number]  Date Initiated:  01/09/2011  Documentation initiated by:  Anibal Henderson  Subjective/Objective Assessment:   Admitted with COPD exacerbation. Pt is from home, is independent, and will return home. pt is Matthews self pay.     Action/Plan:   Referred to financial councilor. Checked on the cost of 2 Rx, but the cost was less than $20, and pt stated she could afford the Rx   Anticipated DC Date:  01/09/2011   Anticipated DC Plan:  HOME/SELF CARE      DC Planning Services  CM consult      Choice offered to / List presented to:             Status of service:  Completed, signed off Medicare Important Message given?   (If response is "NO", the following Medicare IM given date fields will be blank) Date Medicare IM given:   Date Additional Medicare IM given:    Discharge Disposition:  HOME/SELF CARE  Per UR Regulation:    Comments:  01/09/11 1300 Anibal Henderson RN

## 2011-01-28 ENCOUNTER — Encounter (HOSPITAL_COMMUNITY): Payer: Self-pay | Admitting: *Deleted

## 2011-01-28 ENCOUNTER — Emergency Department (HOSPITAL_COMMUNITY): Payer: Self-pay

## 2011-01-28 ENCOUNTER — Other Ambulatory Visit: Payer: Self-pay

## 2011-01-28 ENCOUNTER — Inpatient Hospital Stay (HOSPITAL_COMMUNITY)
Admission: EM | Admit: 2011-01-28 | Discharge: 2011-02-01 | DRG: 194 | Disposition: A | Discharge status: Home / Self Care | Payer: Self-pay | Attending: Internal Medicine | Admitting: Internal Medicine

## 2011-01-28 DIAGNOSIS — F329 Major depressive disorder, single episode, unspecified: Secondary | ICD-10-CM | POA: Diagnosis present

## 2011-01-28 DIAGNOSIS — M549 Dorsalgia, unspecified: Secondary | ICD-10-CM | POA: Diagnosis present

## 2011-01-28 DIAGNOSIS — F17200 Nicotine dependence, unspecified, uncomplicated: Secondary | ICD-10-CM | POA: Diagnosis present

## 2011-01-28 DIAGNOSIS — F1910 Other psychoactive substance abuse, uncomplicated: Secondary | ICD-10-CM | POA: Diagnosis present

## 2011-01-28 DIAGNOSIS — J189 Pneumonia, unspecified organism: Principal | ICD-10-CM | POA: Diagnosis present

## 2011-01-28 DIAGNOSIS — M545 Low back pain, unspecified: Secondary | ICD-10-CM | POA: Diagnosis present

## 2011-01-28 DIAGNOSIS — F1010 Alcohol abuse, uncomplicated: Secondary | ICD-10-CM | POA: Diagnosis present

## 2011-01-28 DIAGNOSIS — J209 Acute bronchitis, unspecified: Secondary | ICD-10-CM | POA: Diagnosis present

## 2011-01-28 DIAGNOSIS — J441 Chronic obstructive pulmonary disease with (acute) exacerbation: Secondary | ICD-10-CM | POA: Diagnosis present

## 2011-01-28 DIAGNOSIS — F1210 Cannabis abuse, uncomplicated: Secondary | ICD-10-CM | POA: Diagnosis present

## 2011-01-28 DIAGNOSIS — G8929 Other chronic pain: Secondary | ICD-10-CM | POA: Diagnosis present

## 2011-01-28 DIAGNOSIS — R0781 Pleurodynia: Secondary | ICD-10-CM | POA: Diagnosis present

## 2011-01-28 DIAGNOSIS — F1410 Cocaine abuse, uncomplicated: Secondary | ICD-10-CM | POA: Diagnosis present

## 2011-01-28 DIAGNOSIS — F3289 Other specified depressive episodes: Secondary | ICD-10-CM | POA: Diagnosis present

## 2011-01-28 DIAGNOSIS — J440 Chronic obstructive pulmonary disease with acute lower respiratory infection: Secondary | ICD-10-CM | POA: Diagnosis present

## 2011-01-28 DIAGNOSIS — R0902 Hypoxemia: Secondary | ICD-10-CM | POA: Diagnosis present

## 2011-01-28 DIAGNOSIS — Z720 Tobacco use: Secondary | ICD-10-CM | POA: Diagnosis present

## 2011-01-28 DIAGNOSIS — E871 Hypo-osmolality and hyponatremia: Secondary | ICD-10-CM | POA: Diagnosis present

## 2011-01-28 DIAGNOSIS — D649 Anemia, unspecified: Secondary | ICD-10-CM | POA: Diagnosis present

## 2011-01-28 HISTORY — DX: Tobacco use: Z72.0

## 2011-01-28 HISTORY — DX: Hypoxemia: R09.02

## 2011-01-28 HISTORY — DX: Alcohol abuse, uncomplicated: F10.10

## 2011-01-28 HISTORY — DX: Anemia, unspecified: D64.9

## 2011-01-28 LAB — BASIC METABOLIC PANEL
BUN: 5 mg/dL — ABNORMAL LOW (ref 6–23)
CO2: 22 mEq/L (ref 19–32)
Chloride: 102 mEq/L (ref 96–112)
Creatinine, Ser: 0.78 mg/dL (ref 0.50–1.10)
GFR calc Af Amer: >90 mL/min (ref >90)
Glucose, Bld: 100 mg/dL — ABNORMAL HIGH (ref 70–99)
Potassium: 3.9 mEq/L (ref 3.5–5.1)

## 2011-01-28 LAB — CBC
HCT: 33.6 % — ABNORMAL LOW (ref 36.0–46.0)
Hemoglobin: 10.5 g/dL — ABNORMAL LOW (ref 12.0–15.0)
MCHC: 31.3 g/dL (ref 30.0–36.0)
RBC: 4.23 MIL/uL (ref 3.87–5.11)

## 2011-01-28 LAB — DIFFERENTIAL
Lymphs Abs: 1.1 10*3/uL (ref 0.7–4.0)
Monocytes Absolute: 0.4 10*3/uL (ref 0.1–1.0)
Monocytes Relative: 7 % (ref 3–12)
Neutro Abs: 3.7 10*3/uL (ref 1.7–7.7)
Neutrophils Relative %: 71 % (ref 43–77)

## 2011-01-28 LAB — CARDIAC PANEL(CRET KIN+CKTOT+MB+TROPI)
Relative Index: 1.2 (ref 0.0–2.5)
Troponin I: <0.3 ng/mL (ref <0.30)

## 2011-01-28 LAB — INFLUENZA PANEL BY PCR (TYPE A & B): Influenza A By PCR: NEGATIVE

## 2011-01-28 LAB — POCT PREGNANCY, URINE: Preg Test, Ur: NEGATIVE

## 2011-01-28 MED ORDER — DEXTROSE 5 % IV SOLN
1.0000 g | INTRAVENOUS | Status: DC
  Administered 2011-01-28 – 2011-01-29 (×2): 1 g via INTRAVENOUS
  Filled 2011-01-28 (×3): qty 10

## 2011-01-28 MED ORDER — GUAIFENESIN ER 600 MG PO TB12
1200.0000 mg | ORAL_TABLET | Freq: Two times a day (BID) | ORAL | Status: DC
  Administered 2011-01-28 – 2011-02-01 (×9): 1200 mg via ORAL
  Filled 2011-01-28 (×9): qty 2

## 2011-01-28 MED ORDER — MORPHINE SULFATE 2 MG/ML IJ SOLN
1.0000 mg | Freq: Once | INTRAMUSCULAR | Status: AC
  Administered 2011-01-28: 1 mg via INTRAVENOUS
  Filled 2011-01-28: qty 1

## 2011-01-28 MED ORDER — KETOROLAC TROMETHAMINE 30 MG/ML IJ SOLN
30.0000 mg | Freq: Once | INTRAMUSCULAR | Status: AC
  Administered 2011-01-28: 30 mg via INTRAVENOUS
  Filled 2011-01-28: qty 1

## 2011-01-28 MED ORDER — ACETAMINOPHEN 650 MG RE SUPP: 650.0000 mg | RECTAL | Status: DC | PRN

## 2011-01-28 MED ORDER — METHYLPREDNISOLONE SODIUM SUCC 125 MG IJ SOLR
125.0000 mg | Freq: Four times a day (QID) | INTRAMUSCULAR | Status: DC
  Administered 2011-01-28 – 2011-01-29 (×3): 125 mg via INTRAVENOUS
  Filled 2011-01-28 (×3): qty 2

## 2011-01-28 MED ORDER — NICOTINE 21 MG/24HR TD PT24
21.0000 mg | MEDICATED_PATCH | Freq: Every day | TRANSDERMAL | Status: DC
  Administered 2011-01-28 – 2011-02-01 (×5): 21 mg via TRANSDERMAL
  Filled 2011-01-28 (×5): qty 1

## 2011-01-28 MED ORDER — IPRATROPIUM BROMIDE 0.02 % IN SOLN
0.5000 mg | Freq: Once | RESPIRATORY_TRACT | Status: AC
  Administered 2011-01-28: 0.5 mg via RESPIRATORY_TRACT
  Filled 2011-01-28: qty 2.5

## 2011-01-28 MED ORDER — ACETAMINOPHEN 325 MG PO TABS
650.0000 mg | ORAL_TABLET | ORAL | Status: DC | PRN
Start: 2011-01-28 — End: 2011-02-01
  Administered 2011-01-28: 650 mg via ORAL
  Filled 2011-01-28 (×2): qty 2

## 2011-01-28 MED ORDER — METHYLPREDNISOLONE SODIUM SUCC 125 MG IJ SOLR: 60.0000 mg | Freq: Two times a day (BID) | INTRAMUSCULAR | Status: DC

## 2011-01-28 MED ORDER — BENZONATATE 100 MG PO CAPS
200.0000 mg | ORAL_CAPSULE | Freq: Three times a day (TID) | ORAL | Status: DC
  Administered 2011-01-28 – 2011-02-01 (×13): 200 mg via ORAL
  Filled 2011-01-28: qty 2
  Filled 2011-01-28: qty 1
  Filled 2011-01-28 (×4): qty 2
  Filled 2011-01-28: qty 1
  Filled 2011-01-28 (×2): qty 2
  Filled 2011-01-28: qty 1
  Filled 2011-01-28 (×4): qty 2
  Filled 2011-01-28: qty 1

## 2011-01-28 MED ORDER — ALBUTEROL SULFATE (5 MG/ML) 0.5% IN NEBU
2.5000 mg | INHALATION_SOLUTION | Freq: Once | RESPIRATORY_TRACT | Status: AC
  Administered 2011-01-28: 2.5 mg via RESPIRATORY_TRACT
  Filled 2011-01-28: qty 0.5

## 2011-01-28 MED ORDER — KETOROLAC TROMETHAMINE 30 MG/ML IJ SOLN
30.0000 mg | Freq: Four times a day (QID) | INTRAMUSCULAR | Status: DC | PRN
  Administered 2011-01-28 – 2011-01-29 (×4): 30 mg via INTRAVENOUS
  Filled 2011-01-28 (×4): qty 1

## 2011-01-28 MED ORDER — AZITHROMYCIN 250 MG PO TABS
500.0000 mg | ORAL_TABLET | Freq: Once | ORAL | Status: AC
  Administered 2011-01-28: 500 mg via ORAL
  Filled 2011-01-28: qty 2

## 2011-01-28 MED ORDER — IPRATROPIUM BROMIDE 0.02 % IN SOLN
0.5000 mg | RESPIRATORY_TRACT | Status: DC
  Administered 2011-01-28 – 2011-02-01 (×22): 0.5 mg via RESPIRATORY_TRACT
  Filled 2011-01-28 (×22): qty 2.5

## 2011-01-28 MED ORDER — FLUOXETINE HCL 20 MG PO CAPS
20.0000 mg | ORAL_CAPSULE | Freq: Every day | ORAL | Status: DC
  Administered 2011-01-28: 20 mg via ORAL
  Filled 2011-01-28: qty 1

## 2011-01-28 MED ORDER — DEXTROSE 5 % IV SOLN
500.0000 mg | INTRAVENOUS | Status: DC
  Administered 2011-01-29 – 2011-01-30 (×2): 500 mg via INTRAVENOUS
  Filled 2011-01-28 (×4): qty 500

## 2011-01-28 MED ORDER — ENOXAPARIN SODIUM 40 MG/0.4ML ~~LOC~~ SOLN
40.0000 mg | Freq: Every day | SUBCUTANEOUS | Status: DC
  Administered 2011-01-28 – 2011-02-01 (×5): 40 mg via SUBCUTANEOUS
  Filled 2011-01-28 (×5): qty 0.4

## 2011-01-28 MED ORDER — ONDANSETRON HCL 4 MG/2ML IJ SOLN
4.0000 mg | Freq: Four times a day (QID) | INTRAMUSCULAR | Status: DC | PRN
  Administered 2011-01-28 – 2011-01-29 (×3): 4 mg via INTRAVENOUS
  Filled 2011-01-28 (×3): qty 2

## 2011-01-28 MED ORDER — POTASSIUM CHLORIDE IN NACL 20-0.9 MEQ/L-% IV SOLN: INTRAVENOUS | Status: AC

## 2011-01-28 MED ORDER — SODIUM CHLORIDE 0.9 % IV BOLUS (SEPSIS)
1000.0000 mL | Freq: Once | INTRAVENOUS | Status: AC
  Administered 2011-01-28: 1000 mL via INTRAVENOUS

## 2011-01-28 MED ORDER — ALBUTEROL SULFATE (5 MG/ML) 0.5% IN NEBU
2.5000 mg | INHALATION_SOLUTION | RESPIRATORY_TRACT | Status: DC
  Administered 2011-01-28 – 2011-02-01 (×21): 2.5 mg via RESPIRATORY_TRACT
  Filled 2011-01-28 (×22): qty 0.5

## 2011-01-28 MED ORDER — MOMETASONE FURO-FORMOTEROL FUM 100-5 MCG/ACT IN AERO
2.0000 | INHALATION_SPRAY | Freq: Two times a day (BID) | RESPIRATORY_TRACT | Status: DC
  Administered 2011-01-28 – 2011-02-01 (×8): 2 via RESPIRATORY_TRACT
  Filled 2011-01-28: qty 13

## 2011-01-28 MED ORDER — TRAZODONE HCL 50 MG PO TABS: 25.0000 mg | ORAL_TABLET | Freq: Every evening | ORAL | Status: DC | PRN

## 2011-01-28 MED ORDER — ONDANSETRON HCL 4 MG PO TABS: 4.0000 mg | ORAL_TABLET | Freq: Four times a day (QID) | ORAL | Status: DC | PRN

## 2011-01-28 MED ORDER — TRAMADOL HCL 50 MG PO TABS
50.0000 mg | ORAL_TABLET | Freq: Once | ORAL | Status: AC
  Administered 2011-01-28: 50 mg via ORAL
  Filled 2011-01-28: qty 1

## 2011-01-28 NOTE — H&P (Signed)
PCP:   HASANAJ,XAJE A, MD   Chief Complaint:  Fever and cough for 2 days  HPI: [name] is an 35 y.o. obese Caucasian female.  COPD with ongoing tobacco abuse; currently in a halfway house, [facility], for alcohol abuse rehabilitation, is in with fever nonproductive cough, and generalized aches and pains for 2 days. She is evaluated in the emergency room chest x-ray was suggestive of pneumonia, and although the plan was to treat her as an out patient, when she gets up to ambulate her saturations dropped to 90% on room air, and if she continues to wheeze and a pill while in the emergency room. Hospitalist service was called to assist with management.  She has had no nausea or vomiting, and pain is not localized to her chair she says she has generalized body aches. She also complains of sore throat. She cannot remember if she has had the flu shot.  Rewiew of Systems:  The patient denies anorexia, weight loss,, vision loss, decreased hearing, chest pain, syncope, dyspnea on exertion, peripheral edema, balance deficits, hemoptysis, abdominal pain, melena, hematochezia, severe indigestion/heartburn, hematuria, incontinence, genital sores, muscle weakness, suspicious skin lesions, transient blindness, difficulty walking, unusual weight change, abnormal bleeding, enlarged lymph nodes, angioedema, and breast masses.   Past Medical History  Diagnosis Date  . COPD (chronic obstructive pulmonary disease)   . Asthma   . Depression   . Tobacco abuse   . Alcohol abuse     Past Surgical History  Procedure Date  . Back surgery   . Cesarean section   . Dilation and curettage of uterus   . Tubal ligation     Medications:  HOME MEDS: Prior to Admission medications   Medication Sig Start Date End Date Taking? Authorizing Provider  albuterol (PROVENTIL HFA;VENTOLIN HFA) 108 (90 BASE) MCG/ACT inhaler Inhale 2 puffs into the lungs every 4 (four) hours as needed for wheezing or shortness of  breath. Shortness of breath 01/09/11  Yes Corinna L Lendell Caprice  FLUoxetine (PROZAC) 20 MG capsule Take 20 mg by mouth daily.     Yes Historical Provider, MD  mometasone-formoterol (DULERA) 100-5 MCG/ACT AERO Inhale 2 puffs into the lungs 2 (two) times daily.     Yes Historical Provider, MD     Allergies:  No Known Allergies  Social History:   reports that she has been smoking Cigarettes.  She has been smoking about 1 pack per day. She does not have any smokeless tobacco history on file. She reports that she drinks alcohol. She reports that she uses illicit drugs ("Crack" cocaine and Marijuana).  Family History: Family History  Problem Relation Age of Onset  . Hypothyroidism Mother   . Coronary artery disease Mother      Physical Exam: Filed Vitals:   01/28/11 0425 01/28/11 0430 01/28/11 0615 01/28/11 0700  BP: 99/50  102/62 135/71  Pulse: 94  81 80  Temp:   98 F (36.7 C) 98.1 F (36.7 C)  TempSrc:   Oral Oral  Resp: 22   22  Height:    5\' 5"  (1.651 m)  Weight:    102.9 kg (226 lb 13.7 oz)  SpO2: 91% 96% 94% 92%   Blood pressure 135/71, pulse 80, temperature 98.1 F (36.7 C), temperature source Oral, resp. rate 22, height 5\' 5"  (1.651 m), weight 102.9 kg (226 lb 13.7 oz), last menstrual period 01/08/2011, SpO2 92.00%.  GEN:  Depressed-looking obese young Caucasian lady lying in the stretcher quite tachypnea; cooperative with exam  PSYCH:  alert and oriented x4; flat affect HEENT: Mucous membranes pink and anicteric; PERRLA; EOM intact; no cervical lymphadenopathy nor thyromegaly or carotid bruit; no JVD; no neck; pharynx not inflamed; Breasts:: Not examined CHEST WALL: No tenderness CHEST: Mild tachypnea; diffuse rhonchi HEART: Regular rate and rhythm; no murmurs rubs or gallops BACK: No kyphosis or scoliosis; no CVA tenderness ABDOMEN: Obese, soft non-tender; no masses, no organomegaly, normal abdominal bowel sounds; Rectal Exam: Not done EXTREMITIES: No bone or joint  deformity;  no edema; no ulcerations. Genitalia: not examined PULSES: 2+ and symmetric SKIN: Normal hydration no rash or ulceration CNS: Cranial nerves 2-12 grossly intact no focal neurologic deficit   Labs & Imaging Results for orders placed during the hospital encounter of 01/28/11 (from the past 48 hour(s))  POCT PREGNANCY, URINE     Status: Normal   Collection Time   01/28/11  1:58 AM      Component Value Range Comment   Preg Test, Ur NEGATIVE     CBC     Status: Abnormal   Collection Time   01/28/11  3:34 AM      Component Value Range Comment   WBC 5.3  4.0 - 10.5 (K/uL)    RBC 4.23  3.87 - 5.11 (MIL/uL)    Hemoglobin 10.5 (*) 12.0 - 15.0 (g/dL)    HCT 16.1 (*) 09.6 - 46.0 (%)    MCV 79.4  78.0 - 100.0 (fL)    MCH 24.8 (*) 26.0 - 34.0 (pg)    MCHC 31.3  30.0 - 36.0 (g/dL)    RDW 04.5  40.9 - 81.1 (%)    Platelets 236  150 - 400 (K/uL)   DIFFERENTIAL     Status: Normal   Collection Time   01/28/11  3:34 AM      Component Value Range Comment   Neutrophils Relative 71  43 - 77 (%)    Neutro Abs 3.7  1.7 - 7.7 (K/uL)    Lymphocytes Relative 21  12 - 46 (%)    Lymphs Abs 1.1  0.7 - 4.0 (K/uL)    Monocytes Relative 7  3 - 12 (%)    Monocytes Absolute 0.4  0.1 - 1.0 (K/uL)    Eosinophils Relative 1  0 - 5 (%)    Eosinophils Absolute 0.0  0.0 - 0.7 (K/uL)    Basophils Relative 0  0 - 1 (%)    Basophils Absolute 0.0  0.0 - 0.1 (K/uL)   BASIC METABOLIC PANEL     Status: Abnormal   Collection Time   01/28/11  3:34 AM      Component Value Range Comment   Sodium 132 (*) 135 - 145 (mEq/L)    Potassium 3.9  3.5 - 5.1 (mEq/L)    Chloride 102  96 - 112 (mEq/L)    CO2 22  19 - 32 (mEq/L)    Glucose, Bld 100 (*) 70 - 99 (mg/dL)    BUN 5 (*) 6 - 23 (mg/dL)    Creatinine, Ser 9.14  0.50 - 1.10 (mg/dL)    Calcium 8.4  8.4 - 10.5 (mg/dL)    GFR calc non Af Amer >90  >90 (mL/min)    GFR calc Af Amer >90  >90 (mL/min)   RAPID STREP SCREEN     Status: Normal   Collection Time    01/28/11  6:10 AM      Component Value Range Comment   Streptococcus, Group A Screen (Direct) NEGATIVE  NEGATIVE  Dg Chest 2 View  01/28/2011  *RADIOLOGY REPORT*  Clinical Data: Cough, fever.  CHEST - 2 VIEW  Comparison: 01/07/2011  Findings: Patchy right greater than left lower lobe opacity and linear consolidation of above the minor fissure.  Right hilar fullness is nonspecific and an underlying lesion is not excluded. Otherwise cardiomediastinal contours are unchanged.  No acute osseous abnormality.  IMPRESSION: Patchy opacities within the right greater than left lower lobes may represent atelectasis or pneumonia.  Scarring versus atelectasis within the right upper lobe. In addition, there appears to be right hilar fullness. Recommend a repeat radiograph following therapy to document resolution.  If the appearance persists, recommend CT.  Original Report Authenticated By: Waneta Martins, [name]      Assessment Present on Admission:  .Pneumonia .COPD exacerbation .h/o Polysubstance abuse .Tobacco abuse .Depression   PLAN: We'll bring this lady in for hydration and treatment of her presumed pneumonia; her clinical presentation is suggestive of a viral illness which may be progressing to pneumonia will ensure we include coverage for Staphylococcus.  Nebulizations for COPD exacerbation; Nicotine replacement and tobacco cessation counseling;  Continue home medications  Other plans as per orders.   Abbe Bula 01/28/2011, 7:28 AM

## 2011-01-28 NOTE — Progress Notes (Signed)
Chart reviewed. Pt examined. Myalgias, wheeze, N/V.  Suspect flu.  See orders.

## 2011-01-28 NOTE — Progress Notes (Signed)
Patient c/o severe chest pain.  Patient has audible wheezing and oxygen saturation is 88-90% on 2 liters of oxygen.  Vital signs taken and EKG done.  Pain medicine given per previous MD orders.  Notified Dr.  Orvan Falconer of patient's chest pain.  MD arrived to floor and assessed patient and gave new orders.  Will carry out orders and continue to monitor patient.  Patient after interventions states chest pain has come down to around a 5-6.

## 2011-01-28 NOTE — ED Provider Notes (Signed)
History     CSN: [id number] Arrival date & time: 01/28/2011  1:37 AM   First MD Initiated Contact with Patient 01/28/11 0146      Chief Complaint  Patient presents with  . Fever  . Shortness of Breath  . Weakness  . Cough  . Sore Throat    (Consider location/radiation/quality/duration/timing/severity/associated sxs/prior treatment) Patient is a 35 y.o. female presenting with fever, shortness of breath, weakness, cough, pharyngitis, and wheezing. The history is provided by the patient. No language interpreter was used.  Fever Primary symptoms of the febrile illness include fever, cough, wheezing, shortness of breath and myalgias. Primary symptoms do not include fatigue, visual change, headaches, abdominal pain, nausea, vomiting, diarrhea, altered mental status, arthralgias or rash. The current episode started 2 days ago. This is a new problem. The problem has been gradually worsening.  The fever began yesterday. The maximum temperature recorded prior to her arrival was 100 to 100.9 F.  The cough began 2 days ago. The cough is non-productive. Cough worsened by: nothing.  The myalgias are associated with weakness.  Risk factors: none. Shortness of Breath  Associated symptoms include a fever, cough, shortness of breath and wheezing. Pertinent negatives include no chest pain. Past medical history comments: COPD.  Weakness The primary symptoms include fever. Primary symptoms do not include headaches, altered mental status, visual change, nausea or vomiting.  Additional symptoms include neck stiffness and weakness.  Cough Associated symptoms include myalgias, shortness of breath and wheezing. Pertinent negatives include no chest pain and no headaches. Past medical history comments: COPD.  Sore Throat Associated symptoms include shortness of breath. Pertinent negatives include no chest pain, no abdominal pain and no headaches.  Wheezing  Associated symptoms include a fever, cough,  shortness of breath and wheezing. Pertinent negatives include no chest pain. There was no intake of a foreign body. She was not exposed to toxic fumes. She has not inhaled smoke recently. She has had prior hospitalizations. She has had no prior ICU admissions. She has had no prior intubations. Past medical history comments: COPD. Urine output has been normal. The last void occurred less than 6 hours ago. There were no sick contacts. She has received no recent medical care.    Past Medical History  Diagnosis Date  . COPD (chronic obstructive pulmonary disease)   . Asthma   . Depression     Past Surgical History  Procedure Date  . Back surgery   . Cesarean section   . Dilation and curettage of uterus   . Tubal ligation     Family History  Problem Relation Age of Onset  . Hypothyroidism Mother   . Coronary artery disease Mother     History  Substance Use Topics  . Smoking status: Current Everyday Smoker -- 1.0 packs/day    Types: Cigarettes  . Smokeless tobacco: Not on file  . Alcohol Use: 0.0 oz/week    12-18 Cans of beer per week    OB History    Grav Para Term Preterm Abortions TAB SAB Ect Mult Living                  Review of Systems  Constitutional: Positive for fever. Negative for fatigue.  HENT: Positive for neck stiffness. Negative for facial swelling.   Eyes: Negative for discharge.  Respiratory: Positive for cough, shortness of breath and wheezing.   Cardiovascular: Negative for chest pain.  Gastrointestinal: Negative for nausea, vomiting, abdominal pain, diarrhea and abdominal distention.  Genitourinary: Negative  for difficulty urinating.  Musculoskeletal: Positive for myalgias. Negative for arthralgias.  Skin: Negative for rash.  Neurological: Positive for weakness. Negative for headaches.  Hematological: Negative.   Psychiatric/Behavioral: Negative.  Negative for altered mental status.    Allergies  Review of patient's allergies indicates no known  allergies.  Home Medications   Current Outpatient Rx  Name Route Sig Dispense Refill  . ALBUTEROL SULFATE HFA 108 (90 BASE) MCG/ACT IN AERS Inhalation Inhale 2 puffs into the lungs every 4 (four) hours as needed for wheezing or shortness of breath. Shortness of breath    . FLUOXETINE HCL 20 MG PO CAPS Oral Take 20 mg by mouth daily.      . MOMETASONE FURO-FORMOTEROL FUM 100-5 MCG/ACT IN AERO Inhalation Inhale 2 puffs into the lungs 2 (two) times daily.        BP 99/50  Pulse 94  Temp 100.7 F (38.2 C)  Resp 22  Ht 5\' 5"  (1.651 m)  Wt 224 lb (101.606 kg)  BMI 37.28 kg/m2  SpO2 96%  LMP 01/08/2011  Physical Exam  Constitutional: She is oriented to person, place, and time. She appears well-developed and well-nourished.  HENT:  Head: Normocephalic and atraumatic.  Mouth/Throat: No oropharyngeal exudate.  Eyes: EOM are normal. Pupils are equal, round, and reactive to light.  Neck: Normal range of motion. Neck supple.  Cardiovascular: Normal rate and regular rhythm.   Pulmonary/Chest: She has wheezes. She has no rales. She exhibits no tenderness.  Abdominal: Soft. Bowel sounds are normal. There is no tenderness. There is no rebound.  Musculoskeletal: Normal range of motion. She exhibits no edema.  Neurological: She is alert and oriented to person, place, and time.  Skin: Skin is warm and dry. She is not diaphoretic.  Psychiatric: Thought content normal.    ED Course  Procedures (including critical care time)  Labs Reviewed  CBC - Abnormal; Notable for the following:    Hemoglobin 10.5 (*)    HCT 33.6 (*)    MCH 24.8 (*)    All other components within normal limits  BASIC METABOLIC PANEL - Abnormal; Notable for the following:    Sodium 132 (*)    Glucose, Bld 100 (*)    BUN 5 (*)    All other components within normal limits  POCT PREGNANCY, URINE  DIFFERENTIAL  POCT PREGNANCY, URINE  RAPID STREP SCREEN   Dg Chest 2 View  01/28/2011  *RADIOLOGY REPORT*  Clinical  Data: Cough, fever.  CHEST - 2 VIEW  Comparison: 01/07/2011  Findings: Patchy right greater than left lower lobe opacity and linear consolidation of above the minor fissure.  Right hilar fullness is nonspecific and an underlying lesion is not excluded. Otherwise cardiomediastinal contours are unchanged.  No acute osseous abnormality.  IMPRESSION: Patchy opacities within the right greater than left lower lobes may represent atelectasis or pneumonia.  Scarring versus atelectasis within the right upper lobe. In addition, there appears to be right hilar fullness. Recommend a repeat radiograph following therapy to document resolution.  If the appearance persists, recommend CT.  Original Report Authenticated By: Waneta Martins, [name]     1. COPD exacerbation       MDM  Continues audibly wheezing with desaturation post nebs and continuous will admit   The patient appears reasonably stabilized for admission considering the current resources, flow, and capabilities available in the ED at this time, and I doubt any other [facility] requiring further screening and/or treatment in the ED prior to admission.  MDM Reviewed: previous chart, nursing note and vitals Interpretation: labs and x-ray Consults: admitting MD        Keeley Sussman K Khamarion Bjelland-Rasch, MD 01/28/11 [phone]

## 2011-01-28 NOTE — ED Notes (Signed)
Pt c/o cough, sore throat, fever, body aches, wheezing, and sob since Wednesday. Last ibuprofen at 2pm

## 2011-01-29 DIAGNOSIS — M549 Dorsalgia, unspecified: Secondary | ICD-10-CM | POA: Diagnosis present

## 2011-01-29 DIAGNOSIS — R0781 Pleurodynia: Secondary | ICD-10-CM | POA: Diagnosis present

## 2011-01-29 LAB — CBC
HCT: 35.6 % — ABNORMAL LOW (ref 36.0–46.0)
Hemoglobin: 11 g/dL — ABNORMAL LOW (ref 12.0–15.0)
MCH: 24.7 pg — ABNORMAL LOW (ref 26.0–34.0)
MCV: 79.8 fL (ref 78.0–100.0)
Platelets: 240 10*3/uL (ref 150–400)
RBC: 4.46 MIL/uL (ref 3.87–5.11)
WBC: 8.8 10*3/uL (ref 4.0–10.5)

## 2011-01-29 LAB — BASIC METABOLIC PANEL
BUN: 7 mg/dL (ref 6–23)
CO2: 24 mEq/L (ref 19–32)
Chloride: 104 mEq/L (ref 96–112)
Glucose, Bld: 172 mg/dL — ABNORMAL HIGH (ref 70–99)
Potassium: 4.2 mEq/L (ref 3.5–5.1)
Sodium: 135 mEq/L (ref 135–145)

## 2011-01-29 LAB — CARDIAC PANEL(CRET KIN+CKTOT+MB+TROPI)
CK, MB: 3 ng/mL (ref 0.3–4.0)
Total CK: 194 U/L — ABNORMAL HIGH (ref 7–177)
Troponin I: <0.3 ng/mL (ref <0.30)

## 2011-01-29 MED ORDER — HYDROCODONE-ACETAMINOPHEN 5-325 MG PO TABS
1.0000 | ORAL_TABLET | Freq: Four times a day (QID) | ORAL | Status: DC | PRN
  Administered 2011-01-29 – 2011-01-31 (×7): 1 via ORAL
  Filled 2011-01-29 (×7): qty 1

## 2011-01-29 MED ORDER — METHYLPREDNISOLONE SODIUM SUCC 125 MG IJ SOLR
60.0000 mg | Freq: Two times a day (BID) | INTRAMUSCULAR | Status: DC
  Administered 2011-01-29 – 2011-01-30 (×3): 60 mg via INTRAVENOUS
  Filled 2011-01-29 (×3): qty 2

## 2011-01-29 MED ORDER — FLUOXETINE HCL 20 MG PO CAPS
40.0000 mg | ORAL_CAPSULE | Freq: Every day | ORAL | Status: DC
  Administered 2011-01-29 – 2011-02-01 (×4): 40 mg via ORAL
  Filled 2011-01-29 (×6): qty 2

## 2011-01-29 MED ORDER — VANCOMYCIN HCL IN DEXTROSE 1-5 GM/200ML-% IV SOLN
1000.0000 mg | Freq: Three times a day (TID) | INTRAVENOUS | Status: DC
  Administered 2011-01-29 (×2): 1000 mg via INTRAVENOUS
  Filled 2011-01-29 (×4): qty 200

## 2011-01-29 MED ORDER — DEXTROSE 5 % IV SOLN
1.0000 g | Freq: Two times a day (BID) | INTRAVENOUS | Status: DC
  Administered 2011-01-29 – 2011-02-01 (×7): 1 g via INTRAVENOUS
  Filled 2011-01-29 (×11): qty 1

## 2011-01-29 NOTE — Progress Notes (Signed)
ANTIBIOTIC CONSULT NOTE - INITIAL  Pharmacy Consult for  Vancomycin Indication: pneumonia (health-care associated)  No Known Allergies  Patient Measurements: Height: 5\' 5"  (165.1 cm) Weight: 217 lb 2.5 oz (98.5 kg) IBW/kg (Calculated) : 57  Adjusted Body Weight:  70 kg  Vital Signs: Temp: 97.5 F (36.4 C) (12/16 0544) Temp src: Oral (12/16 0544) BP: 93/60 mmHg (12/16 0544) Pulse Rate: 84  (12/16 0544) Intake/Output from previous day: 12/15 0701 - 12/16 0700 In: 1320 [P.O.:1320] Out: -  Intake/Output from this shift:    Labs:  [facility] 01/29/11 0441 01/28/11 0334  WBC 8.8 5.3  HGB 11.0* 10.5*  PLT 240 236  LABCREA -- --  CREATININE 0.63 0.78   Estimated Creatinine Clearance: 113 ml/min (by C-G formula based on Cr of 0.63).    Microbiology: Recent Results (from the past 720 hour(s))  RAPID STREP SCREEN     Status: Normal   Collection Time   01/28/11  6:10 AM      Component Value Range Status Comment   Streptococcus, Group A Screen (Direct) NEGATIVE  NEGATIVE  Final     Medical History: Past Medical History  Diagnosis Date  . COPD (chronic obstructive pulmonary disease)   . Asthma   . Depression   . Tobacco abuse   . Alcohol abuse     Medications:  Scheduled:    . albuterol  2.5 mg Nebulization Q4H WA  . azithromycin  500 mg Intravenous Q24H  . benzonatate  200 mg Oral TID  . ceFEPime (MAXIPIME) IV  1 g Intravenous Q12H  . enoxaparin  40 mg Subcutaneous Daily  . FLUoxetine  40 mg Oral Daily  . guaiFENesin  1,200 mg Oral BID  . ipratropium  0.5 mg Nebulization Q4H WA  . methylPREDNISolone (SOLU-MEDROL) injection  60 mg Intravenous Q12H  . mometasone-formoterol  2 puff Inhalation BID  .  morphine injection  1 mg Intravenous Once  . nicotine  21 mg Transdermal Daily  . vancomycin  1,000 mg Intravenous Q8H  . DISCONTD: cefTRIAXone (ROCEPHIN)  IV  1 g Intravenous Q24H  . DISCONTD: FLUoxetine  20 mg Oral Daily  . DISCONTD: methylPREDNISolone  (SOLU-MEDROL) injection  125 mg Intravenous Q6H  . DISCONTD: methylPREDNISolone (SOLU-MEDROL) injection  60 mg Intravenous Q12H   Assessment:  Empiric therapy. Young patient with anticipated rapid clearance.  Goal of Therapy:  Vancomycin trough level 15-20 mcg/ml  Plan:  Measure antibiotic drug levels at steady state Follow up culture results Vancomycin 1000 mg IV every 8 hours. Vancomycin trough on 01-30-11 at 500 am.  Caryl Asp 01/29/2011,12:16 PM

## 2011-01-29 NOTE — Progress Notes (Signed)
Subjective: Severe chest pain, back pain, shortness of breath last night. Better now.  Objective: Vital signs in last 24 hours: Filed Vitals:   01/29/11 0132 01/29/11 0544 01/29/11 0743 01/29/11 1059  BP:  93/60    Pulse:  84    Temp:  97.5 F (36.4 C)    TempSrc:  Oral    Resp:  20    Height:      Weight:  98.5 kg (217 lb 2.5 oz)    SpO2: 92% 93% 90% 92%   Weight change: -3.106 kg (-6 lb 13.6 oz)  Intake/Output Summary (Last 24 hours) at 01/29/11 1204 Last data filed at 01/29/11 0700  Gross per 24 hour  Intake   1320 ml  Output      0 ml  Net   1320 ml   Physical Exam: General: Fairly comfortable Lungs less rhonchi. Mild bilateral wheeze. Breathing is nonlabored.  Abdomen obese soft nontender nondistended Extremities no clubbing cyanosis or edema Vascular regular rate rhythm without murmurs gallops rubs  Lab Results: Basic Metabolic Panel:  Lab 01/29/11 6962 01/28/11 0334  NA 135 132*  K 4.2 3.9  CL 104 102  CO2 24 22  GLUCOSE 172* 100*  BUN 7 5*  CREATININE 0.63 0.78  CALCIUM 9.3 8.4  MG -- --  PHOS -- --   Liver Function Tests: No results found for this basename: AST:2,ALT:2,ALKPHOS:2,BILITOT:2,PROT:2,ALBUMIN:2 in the last 168 hours No results found for this basename: LIPASE:2,AMYLASE:2 in the last 168 hours No results found for this basename: AMMONIA:2 in the last 168 hours CBC:  Lab 01/29/11 0441 01/28/11 0334  WBC 8.8 5.3  NEUTROABS -- 3.7  HGB 11.0* 10.5*  HCT 35.6* 33.6*  MCV 79.8 79.4  PLT 240 236   Cardiac Enzymes:  Lab 01/29/11 0441 01/28/11 2052  CKTOTAL 194* 203*  CKMB 3.0 2.5  CKMBINDEX -- --  TROPONINI <0.30 <0.30   BNP: No components found with this basename: POCBNP:3 D-Dimer: No results found for this basename: DDIMER:2 in the last 168 hours CBG: No results found for this basename: GLUCAP:6 in the last 168 hours Hemoglobin A1C: No results found for this basename: HGBA1C in the last 168 hours Fasting Lipid Panel: No  results found for this basename: CHOL,HDL,LDLCALC,TRIG,CHOLHDL,LDLDIRECT in the last 952 hours Thyroid Function Tests: No results found for this basename: TSH,T4TOTAL,FREET4,T3FREE,THYROIDAB in the last 168 hours Coagulation: No results found for this basename: LABPROT:4,INR:4 in the last 168 hours Anemia Panel: No results found for this basename: VITAMINB12,FOLATE,FERRITIN,TIBC,IRON,RETICCTPCT in the last 168 hours Urine Drug Screen: Drugs of Abuse     Component Value Date/Time   LABOPIA NONE DETECTED 01/08/2011 0415   COCAINSCRNUR NONE DETECTED 01/08/2011 0415   LABBENZ NONE DETECTED 01/08/2011 0415   AMPHETMU NONE DETECTED 01/08/2011 0415   THCU NONE DETECTED 01/08/2011 0415   LABBARB NONE DETECTED 01/08/2011 0415    Micro Results: Recent Results (from the past 240 hour(s))  RAPID STREP SCREEN     Status: Normal   Collection Time   01/28/11  6:10 AM      Component Value Range Status Comment   Streptococcus, Group A Screen (Direct) NEGATIVE  NEGATIVE  Final    Studies/Results: Dg Chest 2 View  01/28/2011  *RADIOLOGY REPORT*  Clinical Data: Cough, fever.  CHEST - 2 VIEW  Comparison: 01/07/2011  Findings: Patchy right greater than left lower lobe opacity and linear consolidation of above the minor fissure.  Right hilar fullness is nonspecific and an underlying lesion is not excluded.  Otherwise cardiomediastinal contours are unchanged.  No acute osseous abnormality.  IMPRESSION: Patchy opacities within the right greater than left lower lobes may represent atelectasis or pneumonia.  Scarring versus atelectasis within the right upper lobe. In addition, there appears to be right hilar fullness. Recommend a repeat radiograph following therapy to document resolution.  If the appearance persists, recommend CT.  Original Report Authenticated By: Waneta Martins, [name]   Scheduled Meds:   . albuterol  2.5 mg Nebulization Q4H WA  . azithromycin  500 mg Intravenous Q24H  . benzonatate  200  mg Oral TID  . cefTRIAXone (ROCEPHIN)  IV  1 g Intravenous Q24H  . enoxaparin  40 mg Subcutaneous Daily  . FLUoxetine  40 mg Oral Daily  . guaiFENesin  1,200 mg Oral BID  . ipratropium  0.5 mg Nebulization Q4H WA  . methylPREDNISolone (SOLU-MEDROL) injection  125 mg Intravenous Q6H  . mometasone-formoterol  2 puff Inhalation BID  .  morphine injection  1 mg Intravenous Once  . nicotine  21 mg Transdermal Daily  . DISCONTD: FLUoxetine  20 mg Oral Daily  . DISCONTD: methylPREDNISolone (SOLU-MEDROL) injection  60 mg Intravenous Q12H   Continuous Infusions:   . 0.9 % NaCl with KCl 20 mEq / L     PRN Meds:.acetaminophen, acetaminophen, HYDROcodone-acetaminophen, ketorolac, ondansetron (ZOFRAN) IV, ondansetron, traZODone Assessment/Plan: Principal Problem:  *HCAP (healthcare-associated pneumonia) patient was admitted and treated for acute bronchitis with COPD exacerbation last month. Active Problems:  COPD exacerbation  Polysubstance abuse, currently sober.  Tobacco abuse  Depression  Chest pain, pleuritic  Back pain Pain is better on current regimen. Add vancomycin cefepime. Stop Rocephin. Continue steroids.   LOS: 1 day   Ilsa Bonello L 01/29/2011, 12:04 PM

## 2011-01-30 MED ORDER — IBUPROFEN 800 MG PO TABS
400.0000 mg | ORAL_TABLET | Freq: Three times a day (TID) | ORAL | Status: DC
  Administered 2011-01-30 – 2011-02-01 (×7): 400 mg via ORAL
  Filled 2011-01-30 (×7): qty 1

## 2011-01-30 MED ORDER — VANCOMYCIN HCL 1000 MG IV SOLR
1250.0000 mg | Freq: Three times a day (TID) | INTRAVENOUS | Status: DC
  Administered 2011-01-30 – 2011-02-01 (×7): 1250 mg via INTRAVENOUS
  Filled 2011-01-30 (×10): qty 1250

## 2011-01-30 MED ORDER — AZITHROMYCIN 250 MG PO TABS
250.0000 mg | ORAL_TABLET | Freq: Every day | ORAL | Status: DC
  Administered 2011-01-30 – 2011-02-01 (×3): 250 mg via ORAL
  Filled 2011-01-30 (×3): qty 1

## 2011-01-30 MED ORDER — PREDNISONE 20 MG PO TABS
40.0000 mg | ORAL_TABLET | Freq: Two times a day (BID) | ORAL | Status: DC
  Administered 2011-01-30 – 2011-02-01 (×5): 40 mg via ORAL
  Filled 2011-01-30 (×5): qty 2

## 2011-01-30 NOTE — Progress Notes (Signed)
Subjective: Still having some right-sided pleuritic pain. Coughing up thick mucus. Some shortness of breath.  Objective: Vital signs in last 24 hours: Filed Vitals:   01/29/11 1903 01/30/11 0505 01/30/11 0736 01/30/11 1115  BP:  106/64    Pulse:  87    Temp:  97.6 F (36.4 C)    TempSrc:  Oral    Resp:  18    Height:      Weight:  99.8 kg (220 lb 0.3 oz)    SpO2: 93% 95% 92% 92%   Weight change: 1.3 kg (2 lb 13.9 oz)  Intake/Output Summary (Last 24 hours) at 01/30/11 1131 Last data filed at 01/30/11 0900  Gross per 24 hour  Intake   1440 ml  Output      0 ml  Net   1440 ml   Physical Exam: General: Fairly comfortable Lungs less rhonchi. Mild bilateral wheeze. Breathing is nonlabored.  Abdomen obese soft nontender nondistended Extremities no clubbing cyanosis or edema Vascular regular rate rhythm without murmurs gallops rubs  Lab Results: Basic Metabolic Panel:  Lab 01/29/11 5621 01/28/11 0334  NA 135 132*  K 4.2 3.9  CL 104 102  CO2 24 22  GLUCOSE 172* 100*  BUN 7 5*  CREATININE 0.63 0.78  CALCIUM 9.3 8.4  MG -- --  PHOS -- --   Liver Function Tests: No results found for this basename: AST:2,ALT:2,ALKPHOS:2,BILITOT:2,PROT:2,ALBUMIN:2 in the last 168 hours No results found for this basename: LIPASE:2,AMYLASE:2 in the last 168 hours No results found for this basename: AMMONIA:2 in the last 168 hours CBC:  Lab 01/29/11 0441 01/28/11 0334  WBC 8.8 5.3  NEUTROABS -- 3.7  HGB 11.0* 10.5*  HCT 35.6* 33.6*  MCV 79.8 79.4  PLT 240 236   Cardiac Enzymes:  Lab 01/29/11 0441 01/28/11 2052  CKTOTAL 194* 203*  CKMB 3.0 2.5  CKMBINDEX -- --  TROPONINI <0.30 <0.30   BNP: No components found with this basename: POCBNP:3 D-Dimer: No results found for this basename: DDIMER:2 in the last 168 hours CBG: No results found for this basename: GLUCAP:6 in the last 168 hours Hemoglobin A1C: No results found for this basename: HGBA1C in the last 168 hours Fasting  Lipid Panel: No results found for this basename: CHOL,HDL,LDLCALC,TRIG,CHOLHDL,LDLDIRECT in the last 308 hours Thyroid Function Tests: No results found for this basename: TSH,T4TOTAL,FREET4,T3FREE,THYROIDAB in the last 168 hours Coagulation: No results found for this basename: LABPROT:4,INR:4 in the last 168 hours Anemia Panel: No results found for this basename: VITAMINB12,FOLATE,FERRITIN,TIBC,IRON,RETICCTPCT in the last 168 hours Urine Drug Screen: Drugs of Abuse     Component Value Date/Time   LABOPIA NONE DETECTED 01/08/2011 0415   COCAINSCRNUR NONE DETECTED 01/08/2011 0415   LABBENZ NONE DETECTED 01/08/2011 0415   AMPHETMU NONE DETECTED 01/08/2011 0415   THCU NONE DETECTED 01/08/2011 0415   LABBARB NONE DETECTED 01/08/2011 0415    Micro Results: Recent Results (from the past 240 hour(s))  RAPID STREP SCREEN     Status: Normal   Collection Time   01/28/11  6:10 AM      Component Value Range Status Comment   Streptococcus, Group A Screen (Direct) NEGATIVE  NEGATIVE  Final    Studies/Results: No results found. Scheduled Meds:    . albuterol  2.5 mg Nebulization Q4H WA  . azithromycin  250 mg Oral Daily  . benzonatate  200 mg Oral TID  . ceFEPime (MAXIPIME) IV  1 g Intravenous Q12H  . enoxaparin  40 mg Subcutaneous Daily  .  FLUoxetine  40 mg Oral Daily  . guaiFENesin  1,200 mg Oral BID  . ibuprofen  400 mg Oral TID WC  . ipratropium  0.5 mg Nebulization Q4H WA  . mometasone-formoterol  2 puff Inhalation BID  . nicotine  21 mg Transdermal Daily  . predniSONE  40 mg Oral BID  . vancomycin  1,250 mg Intravenous Q8H  . DISCONTD: azithromycin  500 mg Intravenous Q24H  . DISCONTD: cefTRIAXone (ROCEPHIN)  IV  1 g Intravenous Q24H  . DISCONTD: methylPREDNISolone (SOLU-MEDROL) injection  125 mg Intravenous Q6H  . DISCONTD: methylPREDNISolone (SOLU-MEDROL) injection  60 mg Intravenous Q12H  . DISCONTD: vancomycin  1,000 mg Intravenous Q8H   Continuous Infusions:  PRN  Meds:.acetaminophen, HYDROcodone-acetaminophen, ondansetron (ZOFRAN) IV, ondansetron, traZODone, DISCONTD: acetaminophen, DISCONTD: ketorolac Assessment/Plan: Principal Problem:  *HCAP (healthcare-associated pneumonia) patient was admitted and treated for acute bronchitis with COPD exacerbation last month. Active Problems:  COPD exacerbation  Polysubstance abuse, currently sober.  Tobacco abuse  Depression  Chest pain, pleuritic  Back pain  Changes azithromycin to by mouth. Continue cefepime and vancomycin for now. Change steroids to by mouth. Increase activity. Wean oxygen. Add 3 times a day ibuprofen. Quit smoking. Hopefully, home and one or 2 days if stable.   LOS: 2 days   [name] L 01/30/2011, 11:31 AM

## 2011-01-30 NOTE — Progress Notes (Signed)
ANTIBIOTIC CONSULT NOTE - Pharmacy Consult for  Vancomycin Indication: pneumonia (health-care associated)  No Known Allergies  Patient Measurements: Height: 5\' 5"  (165.1 cm) Weight: 220 lb 0.3 oz (99.8 kg) IBW/kg (Calculated) : 57  Adjusted Body Weight:  70 kg  Vital Signs: Temp: 97.6 F (36.4 C) (12/17 0505) Temp src: Oral (12/17 0505) BP: 106/64 mmHg (12/17 0505) Pulse Rate: 87  (12/17 0505) Intake/Output from previous day: 12/16 0701 - 12/17 0700 In: 1200 [P.O.:1200] Out: -  Intake/Output from this shift:    Labs:  [facility] 01/29/11 0441 01/28/11 0334  WBC 8.8 5.3  HGB 11.0* 10.5*  PLT 240 236  LABCREA -- --  CREATININE 0.63 0.78  Results for MAYANA, IRIGOYEN (MRN [id number]) as of 01/30/2011 08:10  Ref. Range 01/30/2011 04:48  Vancomycin Tr Latest Range: 10.0-20.0 ug/mL 12.4   Estimated Creatinine Clearance: 113.7 ml/min (by C-G formula based on Cr of 0.63).    Microbiology: Recent Results (from the past 720 hour(s))  RAPID STREP SCREEN     Status: Normal   Collection Time   01/28/11  6:10 AM      Component Value Range Status Comment   Streptococcus, Group A Screen (Direct) NEGATIVE  NEGATIVE  Final     Medical History: Past Medical History  Diagnosis Date  . COPD (chronic obstructive pulmonary disease)   . Asthma   . Depression   . Tobacco abuse   . Alcohol abuse     Medications:  Scheduled:     . albuterol  2.5 mg Nebulization Q4H WA  . azithromycin  500 mg Intravenous Q24H  . benzonatate  200 mg Oral TID  . ceFEPime (MAXIPIME) IV  1 g Intravenous Q12H  . enoxaparin  40 mg Subcutaneous Daily  . FLUoxetine  40 mg Oral Daily  . guaiFENesin  1,200 mg Oral BID  . ipratropium  0.5 mg Nebulization Q4H WA  . methylPREDNISolone (SOLU-MEDROL) injection  60 mg Intravenous Q12H  . mometasone-formoterol  2 puff Inhalation BID  . nicotine  21 mg Transdermal Daily  . vancomycin  1,000 mg Intravenous Q8H  . DISCONTD: cefTRIAXone (ROCEPHIN)  IV  1  g Intravenous Q24H  . DISCONTD: FLUoxetine  20 mg Oral Daily  . DISCONTD: methylPREDNISolone (SOLU-MEDROL) injection  125 mg Intravenous Q6H   Assessment:  Empiric therapy.   Vancomycin trough below goal. Goal of Therapy:  Vancomycin trough level 15-20 mcg/ml  Plan:  Measure antibiotic drug levels at steady state Follow up culture results Change Vancomycin to 1250 mg IV every 8 hours. Follow up Vancomycin trough 01-31-11.    Gilman Buttner, [place] J 01/30/2011,8:11 AM

## 2011-01-31 ENCOUNTER — Encounter (HOSPITAL_COMMUNITY): Payer: Self-pay | Admitting: Internal Medicine

## 2011-01-31 DIAGNOSIS — D649 Anemia, unspecified: Secondary | ICD-10-CM | POA: Diagnosis present

## 2011-01-31 HISTORY — DX: Anemia, unspecified: D64.9

## 2011-01-31 MED ORDER — BIOTENE DRY MOUTH MT LIQD
15.0000 mL | Freq: Two times a day (BID) | OROMUCOSAL | Status: DC
  Administered 2011-01-31 – 2011-02-01 (×3): 15 mL via OROMUCOSAL

## 2011-01-31 NOTE — Progress Notes (Signed)
Subjective: She has a little less right-sided pleuritic pain. She continues to have chest congestion and some shortness of breath, particularly with activity.  Objective: Vital signs in last 24 hours: Filed Vitals:   01/30/11 2358 01/31/11 0526 01/31/11 0756 01/31/11 1106  BP:  96/59    Pulse:  71    Temp:  97.9 F (36.6 C)    TempSrc:  Oral    Resp:  20    Height:      Weight:  101.2 kg (223 lb 1.7 oz)    SpO2: 94% 97% 91% 99%   Weight change: 1.4 kg (3 lb 1.4 oz)  Intake/Output Summary (Last 24 hours) at 01/31/11 1116 Last data filed at 01/31/11 0631  Gross per 24 hour  Intake   1640 ml  Output    300 ml  Net   1340 ml   Physical Exam: General: No acute distress. Lungs: Mild bilateral wheeze. Breathing is nonlabored.  Abdomen obese soft nontender nondistended Extremities no clubbing cyanosis or edema Heart: regular rate rhythm without murmurs gallops rubs  Lab Results: Basic Metabolic Panel:  Lab 01/29/11 1610 01/28/11 0334  NA 135 132*  K 4.2 3.9  CL 104 102  CO2 24 22  GLUCOSE 172* 100*  BUN 7 5*  CREATININE 0.63 0.78  CALCIUM 9.3 8.4  MG -- --  PHOS -- --   Liver Function Tests: No results found for this basename: AST:2,ALT:2,ALKPHOS:2,BILITOT:2,PROT:2,ALBUMIN:2 in the last 168 hours No results found for this basename: LIPASE:2,AMYLASE:2 in the last 168 hours No results found for this basename: AMMONIA:2 in the last 168 hours CBC:  Lab 01/29/11 0441 01/28/11 0334  WBC 8.8 5.3  NEUTROABS -- 3.7  HGB 11.0* 10.5*  HCT 35.6* 33.6*  MCV 79.8 79.4  PLT 240 236   Cardiac Enzymes:  Lab 01/29/11 0441 01/28/11 2052  CKTOTAL 194* 203*  CKMB 3.0 2.5  CKMBINDEX -- --  TROPONINI <0.30 <0.30   BNP: No components found with this basename: POCBNP:3 D-Dimer: No results found for this basename: DDIMER:2 in the last 168 hours CBG: No results found for this basename: GLUCAP:6 in the last 168 hours Hemoglobin A1C: No results found for this basename: HGBA1C  in the last 168 hours Fasting Lipid Panel: No results found for this basename: CHOL,HDL,LDLCALC,TRIG,CHOLHDL,LDLDIRECT in the last 960 hours Thyroid Function Tests: No results found for this basename: TSH,T4TOTAL,FREET4,T3FREE,THYROIDAB in the last 168 hours Coagulation: No results found for this basename: LABPROT:4,INR:4 in the last 168 hours Anemia Panel: No results found for this basename: VITAMINB12,FOLATE,FERRITIN,TIBC,IRON,RETICCTPCT in the last 168 hours Urine Drug Screen: Drugs of Abuse     Component Value Date/Time   LABOPIA NONE DETECTED 01/08/2011 0415   COCAINSCRNUR NONE DETECTED 01/08/2011 0415   LABBENZ NONE DETECTED 01/08/2011 0415   AMPHETMU NONE DETECTED 01/08/2011 0415   THCU NONE DETECTED 01/08/2011 0415   LABBARB NONE DETECTED 01/08/2011 0415    Micro Results: Recent Results (from the past 240 hour(s))  RAPID STREP SCREEN     Status: Normal   Collection Time   01/28/11  6:10 AM      Component Value Range Status Comment   Streptococcus, Group A Screen (Direct) NEGATIVE  NEGATIVE  Final    Studies/Results: No results found. Scheduled Meds:    . albuterol  2.5 mg Nebulization Q4H WA  . antiseptic oral rinse  15 mL Mouth Rinse BID  . azithromycin  250 mg Oral Daily  . benzonatate  200 mg Oral TID  . ceFEPime (  MAXIPIME) IV  1 g Intravenous Q12H  . enoxaparin  40 mg Subcutaneous Daily  . FLUoxetine  40 mg Oral Daily  . guaiFENesin  1,200 mg Oral BID  . ibuprofen  400 mg Oral TID WC  . ipratropium  0.5 mg Nebulization Q4H WA  . mometasone-formoterol  2 puff Inhalation BID  . nicotine  21 mg Transdermal Daily  . predniSONE  40 mg Oral BID  . vancomycin  1,250 mg Intravenous Q8H  . DISCONTD: azithromycin  500 mg Intravenous Q24H  . DISCONTD: methylPREDNISolone (SOLU-MEDROL) injection  60 mg Intravenous Q12H   Continuous Infusions:  PRN Meds:.acetaminophen, HYDROcodone-acetaminophen, ondansetron (ZOFRAN) IV, ondansetron, traZODone, DISCONTD:  ketorolac Assessment/Plan: Principal Problem:  *HCAP (healthcare-associated pneumonia) patient was admitted and treated for acute bronchitis with COPD exacerbation last month. Active Problems:  COPD exacerbation  Polysubstance abuse, currently sober.  Tobacco abuse  Depression  Chest pain, pleuritic  Back pain  Anemia   Continue current treatment. Encouraged ambulation. She was encouraged to stop smoking permanently. Possible discharge tomorrow. We'll check laboratory studies in the morning.   LOS: 3 days   Manraj Yeo 01/31/2011, 11:16 AM

## 2011-01-31 NOTE — Progress Notes (Signed)
ANTIBIOTIC CONSULT NOTE - Pharmacy Consult for  Vancomycin Indication: pneumonia (health-care associated)  No Known Allergies  Patient Measurements: Height: 5\' 5"  (165.1 cm) Weight: 223 lb 1.7 oz (101.2 kg) IBW/kg (Calculated) : 57  Adjusted Body Weight:  70 kg  Vital Signs: Temp: 97.9 F (36.6 C) (12/18 0526) Temp src: Oral (12/18 0526) BP: 96/59 mmHg (12/18 0526) Pulse Rate: 71  (12/18 0526) Intake/Output from previous day: 12/17 0701 - 12/18 0700 In: 1880 [P.O.:840; I.V.:40; IV Piggyback:1000] Out: 300 [Urine:300] Intake/Output from this shift:    Labs:  [facility] 01/29/11 0441  WBC 8.8  HGB 11.0*  PLT 240  LABCREA --  CREATININE 0.63  Results for ZAELYNN, FUCHS (MRN [id number]) as of 01/30/2011 08:10  Ref. Range 01/30/2011 04:48  Vancomycin Tr Latest Range: 10.0-20.0 ug/mL 12.4   Estimated Creatinine Clearance: 114.6 ml/min (by C-G formula based on Cr of 0.63).   Microbiology: Recent Results (from the past 720 hour(s))  RAPID STREP SCREEN     Status: Normal   Collection Time   01/28/11  6:10 AM      Component Value Range Status Comment   Streptococcus, Group A Screen (Direct) NEGATIVE  NEGATIVE  Final    Medical History: Past Medical History  Diagnosis Date  . COPD (chronic obstructive pulmonary disease)   . Asthma   . Depression   . Tobacco abuse   . Alcohol abuse    Medications:  Scheduled:     . albuterol  2.5 mg Nebulization Q4H WA  . antiseptic oral rinse  15 mL Mouth Rinse BID  . azithromycin  250 mg Oral Daily  . benzonatate  200 mg Oral TID  . ceFEPime (MAXIPIME) IV  1 g Intravenous Q12H  . enoxaparin  40 mg Subcutaneous Daily  . FLUoxetine  40 mg Oral Daily  . guaiFENesin  1,200 mg Oral BID  . ibuprofen  400 mg Oral TID WC  . ipratropium  0.5 mg Nebulization Q4H WA  . mometasone-formoterol  2 puff Inhalation BID  . nicotine  21 mg Transdermal Daily  . predniSONE  40 mg Oral BID  . vancomycin  1,250 mg Intravenous Q8H  .  DISCONTD: azithromycin  500 mg Intravenous Q24H  . DISCONTD: methylPREDNISolone (SOLU-MEDROL) injection  60 mg Intravenous Q12H   Assessment:  Empiric therapy.   Vancomycin trough below goal. Goal of Therapy:  Vancomycin trough level 15-20 mcg/ml  Plan:  Vancomycin to 1250 mg IV every 8 hours (done 12/17) Follow up Vancomycin trough tomorrow Labs per protocol  Valrie Hart A 01/31/2011,8:59 AM

## 2011-02-01 ENCOUNTER — Encounter (HOSPITAL_COMMUNITY): Payer: Self-pay | Admitting: Internal Medicine

## 2011-02-01 DIAGNOSIS — E871 Hypo-osmolality and hyponatremia: Secondary | ICD-10-CM | POA: Diagnosis present

## 2011-02-01 DIAGNOSIS — R0902 Hypoxemia: Secondary | ICD-10-CM | POA: Diagnosis present

## 2011-02-01 HISTORY — DX: Hypoxemia: R09.02

## 2011-02-01 LAB — CBC
HCT: 33.8 % — ABNORMAL LOW (ref 36.0–46.0)
MCH: 24.4 pg — ABNORMAL LOW (ref 26.0–34.0)
MCV: 80.9 fL (ref 78.0–100.0)
RDW: 16 % — ABNORMAL HIGH (ref 11.5–15.5)
WBC: 8 10*3/uL (ref 4.0–10.5)

## 2011-02-01 LAB — VANCOMYCIN, RANDOM: Vancomycin Rm: 34.6 ug/mL

## 2011-02-01 LAB — BASIC METABOLIC PANEL
CO2: 30 mEq/L (ref 19–32)
Chloride: 105 mEq/L (ref 96–112)
Creatinine, Ser: 0.61 mg/dL (ref 0.50–1.10)
Glucose, Bld: 98 mg/dL (ref 70–99)

## 2011-02-01 MED ORDER — PREDNISONE 10 MG PO TABS: ORAL_TABLET | ORAL | Status: DC

## 2011-02-01 MED ORDER — ALBUTEROL SULFATE HFA 108 (90 BASE) MCG/ACT IN AERS: 2.0000 | INHALATION_SPRAY | Freq: Four times a day (QID) | RESPIRATORY_TRACT | Status: DC

## 2011-02-01 MED ORDER — ALBUTEROL SULFATE (2.5 MG/3ML) 0.083% IN NEBU: 2.5000 mg | INHALATION_SOLUTION | Freq: Four times a day (QID) | RESPIRATORY_TRACT | Status: DC | PRN

## 2011-02-01 MED ORDER — BENZONATATE 200 MG PO CAPS: 200.0000 mg | ORAL_CAPSULE | Freq: Three times a day (TID) | ORAL | Status: AC | PRN

## 2011-02-01 MED ORDER — AZITHROMYCIN 250 MG PO TABS: ORAL_TABLET | ORAL | Status: AC

## 2011-02-01 MED ORDER — ALBUTEROL SULFATE HFA 108 (90 BASE) MCG/ACT IN AERS
2.0000 | INHALATION_SPRAY | RESPIRATORY_TRACT | Status: DC
  Administered 2011-02-01: 2 via RESPIRATORY_TRACT
  Filled 2011-02-01: qty 6.7

## 2011-02-01 MED ORDER — MOMETASONE FURO-FORMOTEROL FUM 100-5 MCG/ACT IN AERO: 2.0000 | INHALATION_SPRAY | Freq: Two times a day (BID) | RESPIRATORY_TRACT | Status: DC

## 2011-02-01 NOTE — Progress Notes (Signed)
UR Chart Review Completed  

## 2011-02-01 NOTE — Progress Notes (Signed)
    CARE MANAGEMENT NOTE 02/01/2011  Patient:  [name], [name]   Account Number:  [id number]  Date Initiated:  01/30/2011  Documentation initiated by:  Mendel Corning  Subjective/Objective Assessment:   35 yr old female  with pneumonia lives in half-way house hx of drug abue     Action/Plan:   Anticipated DC Date:  02/01/2011   Anticipated DC Plan:  HOME/SELF CARE  In-house referral  Financial Counselor      DC Planning Services  CM consult  Medication Assistance      [facility] Choice  DURABLE MEDICAL EQUIPMENT   Choice offered to / List presented to:  C-1 Patient   DME arranged  [facility]      DME agency  [facility]        Status of service:   Medicare Important Message given?   (If response is "NO", the following Medicare IM given date fields will be blank) Date Medicare IM given:   Date Additional Medicare IM given:    Discharge Disposition:  HOME/SELF CARE  Per UR Regulation:    Comments:  02/01/2011 Mendel Corning rn bsn pt d/c home today followup appointment with pcp made.  notified by md pt o2 sat 87 % ra and will need home o2. o2 arranged as above.

## 2011-02-01 NOTE — Progress Notes (Signed)
Pt discharge instructions given to pt and family memebers. Pt verbalized understanding. Pt accompanied to awaiting vehicle via wheelchair with oxygen in place. Pt stable on discharge.

## 2011-02-01 NOTE — Progress Notes (Signed)
ANTIBIOTIC CONSULT NOTE - Pharmacy Consult for  Vancomycin Indication: pneumonia (health-care associated)  No Known Allergies  Patient Measurements: Height: 5\' 5"  (165.1 cm) Weight: 223 lb 1.7 oz (101.2 kg) IBW/kg (Calculated) : 57  Adjusted Body Weight:  70 kg  Vital Signs: Temp: 98.2 F (36.8 C) (12/19 0602) Temp src: Oral (12/19 0602) BP: 118/74 mmHg (12/19 0602) Pulse Rate: 91  (12/19 0602) Intake/Output from previous day: 12/18 0701 - 12/19 0700 In: 3040 [P.O.:1640; IV Piggyback:1400] Out: 1700 [Urine:1700] Intake/Output from this shift:    Labs:  [facility] 02/01/11 0448  WBC 8.0  HGB 10.2*  PLT 248  LABCREA --  CREATININE 0.61  Results for TISHIE, ALTMANN (MRN [id number]) as of 01/30/2011 08:10  Ref. Range 01/30/2011 04:48  Vancomycin Tr Latest Range: 10.0-20.0 ug/mL 12.4   Estimated Creatinine Clearance: 114.6 ml/min (by C-G formula based on Cr of 0.61).   Microbiology: Recent Results (from the past 720 hour(s))  RAPID STREP SCREEN     Status: Normal   Collection Time   01/28/11  6:10 AM      Component Value Range Status Comment   Streptococcus, Group A Screen (Direct) NEGATIVE  NEGATIVE  Final    Medical History: Past Medical History  Diagnosis Date  . COPD (chronic obstructive pulmonary disease)   . Asthma   . Depression   . Tobacco abuse   . Alcohol abuse   . Anemia 01/31/2011   Medications:  Scheduled:     . albuterol  2.5 mg Nebulization Q4H WA  . antiseptic oral rinse  15 mL Mouth Rinse BID  . azithromycin  250 mg Oral Daily  . benzonatate  200 mg Oral TID  . ceFEPime (MAXIPIME) IV  1 g Intravenous Q12H  . enoxaparin  40 mg Subcutaneous Daily  . FLUoxetine  40 mg Oral Daily  . guaiFENesin  1,200 mg Oral BID  . ibuprofen  400 mg Oral TID WC  . ipratropium  0.5 mg Nebulization Q4H WA  . mometasone-formoterol  2 puff Inhalation BID  . nicotine  21 mg Transdermal Daily  . predniSONE  40 mg Oral BID  . vancomycin  1,250 mg  Intravenous Q8H   Assessment: The random level reported today at 34 is invalid due to the 2200 dose of Vancomycin being given late at ~ 0240 because of loss of IV access.  Will need to re-check level.  Goal of Therapy:  Vancomycin trough level 15-20 mcg/ml  Plan:  Vancomycin to 1250 mg IV every 8 hours (done 12/17) Follow up Vancomycin trough tomorrow am Labs per protocol  Valrie Hart A 02/01/2011,9:20 AM

## 2011-02-01 NOTE — Discharge Summary (Signed)
Physician Discharge Summary  [name] MRN: [id number] DOB/AGE: 09-06-1975 35 y.o.  PCP: Toma Deiters, MD   Admit date: 01/28/2011 Discharge date: 02/01/2011  Discharge Diagnoses:  1. Healthcare associated pneumonia. 2. COPD with bronchitic exacerbation. 3. Hypoxia with activity with her oxygen saturations falling to 87% following ambulation. Her oxygen saturation at rest was 93%. She was discharged on home oxygen, 2 L per minute. 4. Tobacco abuse. 5. History of polysubstance abuse including alcohol, marijuana, and crack cocaine. She is currently in an alcohol abuse rehabilitation halfway house. She has been abstinent for at least one month. 6. Pleuritic chest pain, secondary to pneumonia. 7. 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 8. Chronic depression. 9. Chronic low back pain. 10. Hyponatremia, resolved with IV fluid hydration.    Current Discharge Medication List    START taking these medications   Details  azithromycin (ZITHROMAX Z-PAK) 250 MG tablet TAKE 2 TABLETS FOR 1 DAY STARTING TOMORROW; THEN 1 TABLET DAILY UNTIL COMPLETED. Qty: 6 each, Refills: 0    benzonatate (TESSALON) 200 MG capsule Take 1 capsule (200 mg total) by mouth 3 (three) times daily as needed for cough. Qty: 20 capsule, Refills: 1    predniSONE (DELTASONE) 10 MG tablet TAKE 6 TABLETS STARTING TOMORROW FOR 1 DAY; THEN 5 TABLETS THE NEXT DAY; THEN 4 TABLETS THE NEXT DAY; THEN 3 TABLETS THE NEXT DAY; THEN 2 TABLETS THE NEXT DAY; THE 1 TABLET THE NEXT DAY; THEN STOP. Qty: 21 tablet, Refills: 0      CONTINUE these medications which have CHANGED   Details  albuterol (PROVENTIL HFA;VENTOLIN HFA) 108 (90 BASE) MCG/ACT inhaler Inhale 2 puffs into the lungs 4 (four) times daily. Shortness of breath Qty: 1 Inhaler, Refills: 1    albuterol (PROVENTIL) (2.5 MG/3ML) 0.083% nebulizer solution Take 3 mLs (2.5 mg total) by nebulization every 6 (six) hours as needed for wheezing or shortness of  breath. For asthma Qty: 75 mL, Refills: 1    mometasone-formoterol (DULERA) 100-5 MCG/ACT AERO Inhale 2 puffs into the lungs 2 (two) times daily. Qty: 1 Inhaler, Refills: 1      CONTINUE these medications which have NOT CHANGED   Details  FLUoxetine (PROZAC) 40 MG capsule Take 40 mg by mouth daily.        Home oxygen 2 L per minute.    Discharge Condition: Improved and stable.  Disposition: Home or Self Care   Consults: None.   Significant Diagnostic Studies: Dg Chest 2 View  01/28/2011  *RADIOLOGY REPORT*  Clinical Data: Cough, fever.  CHEST - 2 VIEW  Comparison: 01/07/2011  Findings: Patchy right greater than left lower lobe opacity and linear consolidation of above the minor fissure.  Right hilar fullness is nonspecific and an underlying lesion is not excluded. Otherwise cardiomediastinal contours are unchanged.  No acute osseous abnormality.  IMPRESSION: Patchy opacities within the right greater than left lower lobes may represent atelectasis or pneumonia.  Scarring versus atelectasis within the right upper lobe. In addition, there appears to be right hilar fullness. Recommend a repeat radiograph following therapy to document resolution.  If the appearance persists, recommend CT.  Original Report Authenticated By: Waneta Martins, [name]   Dg Chest Portable 1 View  01/07/2011  *RADIOLOGY REPORT*  Clinical Data: Shortness of breath, congestion, history COPD  PORTABLE CHEST - 1 VIEW  Comparison: Portable exam 2320 hours compared to 01/07/2010  Findings: Normal heart size, mediastinal contours, and pulmonary vascularity. Minimal atelectasis at minor fissure. Lungs  otherwise clear. No pleural effusion or pneumothorax. Bones unremarkable.  IMPRESSION: Minimal atelectasis at minor fissure.  Original Report Authenticated By: Lollie Marrow, [name]     Microbiology: Recent Results (from the past 240 hour(s))  RAPID STREP SCREEN     Status: Normal   Collection Time   01/28/11  6:10 AM       Component Value Range Status Comment   Streptococcus, Group A Screen (Direct) NEGATIVE  NEGATIVE  Final      Labs: Results for orders placed during the hospital encounter of 01/28/11 (from the past 48 hour(s))  VANCOMYCIN, RANDOM     Status: Normal   Collection Time   02/01/11  4:48 AM      Component Value Range Comment   Vancomycin Rm 34.6     BASIC METABOLIC PANEL     Status: Normal   Collection Time   02/01/11  4:48 AM      Component Value Range Comment   Sodium 140  135 - 145 (mEq/L)    Potassium 3.9  3.5 - 5.1 (mEq/L)    Chloride 105  96 - 112 (mEq/L)    CO2 30  19 - 32 (mEq/L)    Glucose, Bld 98  70 - 99 (mg/dL)    BUN 13  6 - 23 (mg/dL)    Creatinine, Ser 1.61  0.50 - 1.10 (mg/dL)    Calcium 8.7  8.4 - 10.5 (mg/dL)    GFR calc non Af Amer >90  >90 (mL/min)    GFR calc Af Amer >90  >90 (mL/min)   CBC     Status: Abnormal   Collection Time   02/01/11  4:48 AM      Component Value Range Comment   WBC 8.0  4.0 - 10.5 (K/uL)    RBC 4.18  3.87 - 5.11 (MIL/uL)    Hemoglobin 10.2 (*) 12.0 - 15.0 (g/dL)    HCT 09.6 (*) 04.5 - 46.0 (%)    MCV 80.9  78.0 - 100.0 (fL)    MCH 24.4 (*) 26.0 - 34.0 (pg)    MCHC 30.2  30.0 - 36.0 (g/dL)    RDW 40.9 (*) 81.1 - 15.5 (%)    Platelets 248  150 - 400 (K/uL)      HPI : The patient is a 35 her old woman with a past medical history significant for COPD, depression, and polysubstance abuse. She presented to the emergency department on 01/28/2011 with a chief complaint of fever, cough, and pleuritic chest pain. She was recently hospitalized from 01/07/2011 through 01/09/2011 for acute bronchitis. In the emergency department, she was afebrile and hemodynamically stable. Her oxygen saturation ranged from 90-94% on oxygen supplementation. Her WBC was within normal limits at 5.3. Her hemoglobin was low at and 10.5. Her chest x-ray revealed scarring versus atelectasis within the right upper lobe and right hilar fullness. She was admitted for  further evaluation and management.  HOSPITAL COURSE: The patient was started empirically on antibiotics for treatment of pneumonia. The following day, antibiotic therapy was brought in with vancomycin, cefepime, and azithromycin for hospital associated pneumonia, given that she had been recently hospitalized for acute bronchitis. IV fluids were started for hydration as the patient's serum sodium was slightly low. For superimposed acute bronchitis and bronchospasms, she was started on intravenous steroids and bronchodilator therapy. Tessalon Perles were added for cough. Analgesics were given as needed for pleuritic chest pain. Her cardiac enzymes first set that she ruled out for myocardial infarction.  Her pro BNP was within normal limits. Her influenza panel was negative. Her strep screen was also negative.  She continues to smoke. A nicotine patch was placed. Tobacco cessation counseling was ordered. She was encouraged to stop smoking permanently.  She improved symptomatically and clinically. However, she subjectively felt more short of breath off of oxygen. Prior to discharge, her oxygen saturation was assessed on room air and with activity. It was 93% at rest and 87% with ambulation. Therefore, she was discharged on home oxygen at 2 L per minute.  She received 4-1/2 days of intravenous antibiotics. She was discharged on Z-Pak for 5 more days, prednisone taper, albuterol nebulizer, Dulera and her other chronic medications. She was hemodynamically stable at the time of discharge. Her serum sodium improved and normalized. Her depression remained stable and treated. The case manager was instrumental in obtaining assistance with medications at the time of discharge.     Discharge Exam: Blood pressure 110/72, pulse 79, temperature 98.4 F (36.9 C), temperature source Oral, resp. rate 20, height 5\' 5"  (1.651 m), weight 101.2 kg (223 lb 1.7 oz), last menstrual period 01/08/2011, SpO2 91.00%. Lungs:  Occasional wheezes, breathing nonlabored. Heart: S1, S2, with a soft systolic murmur. Abdomen: Obese, positive bowel sounds, soft, nondistended. Nontender. Extremities: No pedal edema.    Discharge Orders    Future Orders Please Complete By Expires   Diet general      Increase activity slowly      Discharge instructions      Comments:   AVOID SMOKING.      Follow-up Information    Follow up with HASANAJ,XAJE A on 02/21/2011. (AT 11:00 AM)            Discharge time: 40 minutes.  Signed: Shadae Reino 02/01/2011, 3:52 PM

## 2011-05-01 ENCOUNTER — Encounter (HOSPITAL_COMMUNITY): Payer: Self-pay | Admitting: Oncology

## 2011-05-01 ENCOUNTER — Emergency Department (HOSPITAL_COMMUNITY): Payer: Self-pay

## 2011-05-01 ENCOUNTER — Inpatient Hospital Stay (HOSPITAL_COMMUNITY)
Admission: EM | Admit: 2011-05-01 | Discharge: 2011-05-02 | DRG: 191 | Disposition: A | Discharge status: Home / Self Care | Payer: Self-pay | Attending: Internal Medicine | Admitting: Internal Medicine

## 2011-05-01 DIAGNOSIS — F3289 Other specified depressive episodes: Secondary | ICD-10-CM | POA: Diagnosis present

## 2011-05-01 DIAGNOSIS — E669 Obesity, unspecified: Secondary | ICD-10-CM | POA: Diagnosis present

## 2011-05-01 DIAGNOSIS — J45901 Unspecified asthma with (acute) exacerbation: Secondary | ICD-10-CM | POA: Diagnosis present

## 2011-05-01 DIAGNOSIS — J440 Chronic obstructive pulmonary disease with acute lower respiratory infection: Principal | ICD-10-CM | POA: Diagnosis present

## 2011-05-01 DIAGNOSIS — Z6835 Body mass index (BMI) 35.0-35.9, adult: Secondary | ICD-10-CM

## 2011-05-01 DIAGNOSIS — J209 Acute bronchitis, unspecified: Principal | ICD-10-CM | POA: Diagnosis present

## 2011-05-01 DIAGNOSIS — J441 Chronic obstructive pulmonary disease with (acute) exacerbation: Secondary | ICD-10-CM | POA: Diagnosis present

## 2011-05-01 DIAGNOSIS — F17200 Nicotine dependence, unspecified, uncomplicated: Secondary | ICD-10-CM | POA: Diagnosis present

## 2011-05-01 DIAGNOSIS — F329 Major depressive disorder, single episode, unspecified: Secondary | ICD-10-CM | POA: Diagnosis present

## 2011-05-01 LAB — BASIC METABOLIC PANEL
CO2: 25 mEq/L (ref 19–32)
Chloride: 104 mEq/L (ref 96–112)
Creatinine, Ser: 0.75 mg/dL (ref 0.50–1.10)

## 2011-05-01 LAB — CBC
HCT: 36.6 % (ref 36.0–46.0)
Hemoglobin: 11.6 g/dL — ABNORMAL LOW (ref 12.0–15.0)
RDW: 14.9 % (ref 11.5–15.5)
WBC: 7.7 10*3/uL (ref 4.0–10.5)

## 2011-05-01 LAB — DIFFERENTIAL
Basophils Absolute: 0 10*3/uL (ref 0.0–0.1)
Lymphocytes Relative: 32 % (ref 12–46)
Monocytes Absolute: 0.5 10*3/uL (ref 0.1–1.0)
Neutro Abs: 4.2 10*3/uL (ref 1.7–7.7)

## 2011-05-01 MED ORDER — SODIUM CHLORIDE 0.9 % IV SOLN: INTRAVENOUS | Status: DC

## 2011-05-01 MED ORDER — IPRATROPIUM BROMIDE 0.02 % IN SOLN
0.5000 mg | Freq: Once | RESPIRATORY_TRACT | Status: AC
  Administered 2011-05-01: 0.5 mg via RESPIRATORY_TRACT
  Filled 2011-05-01: qty 2.5

## 2011-05-01 MED ORDER — IPRATROPIUM BROMIDE 0.02 % IN SOLN
RESPIRATORY_TRACT | Status: AC
  Filled 2011-05-01: qty 2.5

## 2011-05-01 MED ORDER — ALBUTEROL SULFATE (5 MG/ML) 0.5% IN NEBU
INHALATION_SOLUTION | RESPIRATORY_TRACT | Status: AC
  Filled 2011-05-01: qty 1

## 2011-05-01 MED ORDER — ALBUTEROL SULFATE (5 MG/ML) 0.5% IN NEBU: 2.5000 mg | INHALATION_SOLUTION | RESPIRATORY_TRACT | Status: DC | PRN

## 2011-05-01 MED ORDER — GUAIFENESIN ER 600 MG PO TB12
600.0000 mg | ORAL_TABLET | Freq: Two times a day (BID) | ORAL | Status: DC
  Administered 2011-05-01 – 2011-05-02 (×2): 600 mg via ORAL
  Filled 2011-05-01 (×2): qty 1

## 2011-05-01 MED ORDER — ONDANSETRON HCL 4 MG PO TABS: 4.0000 mg | ORAL_TABLET | Freq: Four times a day (QID) | ORAL | Status: DC | PRN

## 2011-05-01 MED ORDER — MOMETASONE FURO-FORMOTEROL FUM 100-5 MCG/ACT IN AERO
2.0000 | INHALATION_SPRAY | Freq: Two times a day (BID) | RESPIRATORY_TRACT | Status: DC
  Administered 2011-05-02: 2 via RESPIRATORY_TRACT
  Filled 2011-05-01: qty 13

## 2011-05-01 MED ORDER — MOXIFLOXACIN HCL IN NACL 400 MG/250ML IV SOLN
400.0000 mg | INTRAVENOUS | Status: DC
  Administered 2011-05-01: 400 mg via INTRAVENOUS
  Filled 2011-05-01 (×2): qty 250

## 2011-05-01 MED ORDER — ONDANSETRON HCL 4 MG/2ML IJ SOLN: 4.0000 mg | Freq: Four times a day (QID) | INTRAMUSCULAR | Status: DC | PRN

## 2011-05-01 MED ORDER — ALBUTEROL SULFATE (5 MG/ML) 0.5% IN NEBU
5.0000 mg | INHALATION_SOLUTION | RESPIRATORY_TRACT | Status: DC | PRN
  Administered 2011-05-01: 5 mg via RESPIRATORY_TRACT
  Administered 2011-05-01: 2.5 mg via RESPIRATORY_TRACT

## 2011-05-01 MED ORDER — ALBUTEROL SULFATE (5 MG/ML) 0.5% IN NEBU
5.0000 mg | INHALATION_SOLUTION | Freq: Once | RESPIRATORY_TRACT | Status: AC
Start: 2011-05-01 — End: 2011-05-01
  Administered 2011-05-01: 2.5 mg via RESPIRATORY_TRACT
  Filled 2011-05-01 (×2): qty 0.5

## 2011-05-01 MED ORDER — IPRATROPIUM BROMIDE 0.02 % IN SOLN: 0.5000 mg | Freq: Four times a day (QID) | RESPIRATORY_TRACT | Status: DC

## 2011-05-01 MED ORDER — METHYLPREDNISOLONE SODIUM SUCC 125 MG IJ SOLR
125.0000 mg | Freq: Once | INTRAMUSCULAR | Status: AC
  Administered 2011-05-01: 125 mg via INTRAVENOUS
  Filled 2011-05-01: qty 2

## 2011-05-01 MED ORDER — ACETAMINOPHEN 650 MG RE SUPP: 650.0000 mg | Freq: Four times a day (QID) | RECTAL | Status: DC | PRN

## 2011-05-01 MED ORDER — MOXIFLOXACIN HCL IN NACL 400 MG/250ML IV SOLN
INTRAVENOUS | Status: AC
  Filled 2011-05-01: qty 250

## 2011-05-01 MED ORDER — METHYLPREDNISOLONE SODIUM SUCC 125 MG IJ SOLR
60.0000 mg | Freq: Four times a day (QID) | INTRAMUSCULAR | Status: DC
  Administered 2011-05-01 – 2011-05-02 (×4): 60 mg via INTRAVENOUS
  Filled 2011-05-01 (×4): qty 2

## 2011-05-01 MED ORDER — ACETAMINOPHEN 325 MG PO TABS: 650.0000 mg | ORAL_TABLET | Freq: Four times a day (QID) | ORAL | Status: DC | PRN

## 2011-05-01 MED ORDER — ALBUTEROL SULFATE (5 MG/ML) 0.5% IN NEBU
5.0000 mg | INHALATION_SOLUTION | Freq: Once | RESPIRATORY_TRACT | Status: AC
  Administered 2011-05-01: 5 mg via RESPIRATORY_TRACT
  Filled 2011-05-01: qty 1

## 2011-05-01 MED ORDER — IPRATROPIUM BROMIDE 0.02 % IN SOLN
0.5000 mg | Freq: Four times a day (QID) | RESPIRATORY_TRACT | Status: DC
  Administered 2011-05-01 – 2011-05-02 (×3): 0.5 mg via RESPIRATORY_TRACT
  Filled 2011-05-01 (×3): qty 2.5

## 2011-05-01 MED ORDER — ALBUTEROL SULFATE (5 MG/ML) 0.5% IN NEBU: 2.5000 mg | INHALATION_SOLUTION | Freq: Four times a day (QID) | RESPIRATORY_TRACT | Status: DC

## 2011-05-01 MED ORDER — FLUOXETINE HCL 20 MG PO CAPS
40.0000 mg | ORAL_CAPSULE | Freq: Every day | ORAL | Status: DC
  Administered 2011-05-01: 40 mg via ORAL
  Filled 2011-05-01: qty 2

## 2011-05-01 MED ORDER — ONDANSETRON HCL 4 MG/2ML IJ SOLN: 4.0000 mg | Freq: Three times a day (TID) | INTRAMUSCULAR | Status: DC | PRN

## 2011-05-01 MED ORDER — ENOXAPARIN SODIUM 40 MG/0.4ML ~~LOC~~ SOLN
40.0000 mg | SUBCUTANEOUS | Status: DC
  Administered 2011-05-01: 40 mg via SUBCUTANEOUS
  Filled 2011-05-01: qty 0.4

## 2011-05-01 MED ORDER — ALBUTEROL SULFATE (5 MG/ML) 0.5% IN NEBU
2.5000 mg | INHALATION_SOLUTION | Freq: Four times a day (QID) | RESPIRATORY_TRACT | Status: DC
  Administered 2011-05-01 – 2011-05-02 (×4): 2.5 mg via RESPIRATORY_TRACT
  Filled 2011-05-01 (×3): qty 0.5

## 2011-05-01 NOTE — ED Notes (Signed)
[name] reports shortness of breath and wheezing onset yesterday, non-productive cough, denies fevers.

## 2011-05-01 NOTE — H&P (Signed)
PCP:   Toma Deiters, MD, MD   Chief Complaint:  Shortness of breath  HPI: This is a 36 year old female with past medical history of COPD was last hospitalized in December for required pneumonia. Since that time patient reports that she has had on and off difficulty breathing. She was sent home on oxygen at that time to be used when necessary. She reports that for the past 3 weeks she has been using it continuously. She also has a nebulizer machine at home that she's been using. She reports that for the past 3-4 days she's noticed an increase in her cough which is productive. She has also noticed a significant worsening of her shortness of breath with wheezing that occurred starting yesterday. She denies any fever. She denies any sick contacts. She reports that she is not smoking and actually feels that her symptoms got worse since she quit smoking. She has some substernal chest pain which is likely pleuritic since it is worse with coughing. The patient was evaluated in the emergency room and received nebulization treatments. When she did not significantly improve, she was referred for admission  Allergies:  No Known Allergies    Past Medical History  Diagnosis Date  . COPD (chronic obstructive pulmonary disease)   . Asthma   . Depression   . Tobacco abuse   . Alcohol abuse   . Anemia 01/31/2011  . Hypoxia 02/01/2011    Past Surgical History  Procedure Date  . Back surgery   . Cesarean section   . Dilation and curettage of uterus   . Tubal ligation     Prior to Admission medications   Medication Sig Start Date End Date Taking? Authorizing Provider  albuterol (PROVENTIL HFA;VENTOLIN HFA) 108 (90 BASE) MCG/ACT inhaler Inhale 2 puffs into the lungs 4 (four) times daily. Shortness of breath 02/01/11  Yes Elliot Cousin, MD  albuterol (PROVENTIL) (2.5 MG/3ML) 0.083% nebulizer solution Take 3 mLs (2.5 mg total) by nebulization every 6 (six) hours as needed for wheezing or shortness of  breath. For asthma 02/01/11  Yes Elliot Cousin, MD  FLUoxetine (PROZAC) 40 MG capsule Take 40 mg by mouth at bedtime.    Yes Historical Provider, MD  ipratropium (ATROVENT) 0.02 % nebulizer solution Take 500 mcg by nebulization 4 (four) times daily. Mixes with the albuterol solution   Yes Historical Provider, MD  mometasone-formoterol (DULERA) 100-5 MCG/ACT AERO Inhale 2 puffs into the lungs 2 (two) times daily. 02/01/11  Yes Elliot Cousin, MD    Social History:  reports that she has been smoking Cigarettes.  She has been smoking about 1 pack per day. She does not have any smokeless tobacco history on file. She reports that she does not drink alcohol or use illicit drugs.  Family History  Problem Relation Age of Onset  . Hypothyroidism Mother   . Coronary artery disease Mother     Review of Systems: Positives in bold Constitutional: Denies fever, chills, diaphoresis, appetite change and fatigue.  HEENT: Denies photophobia, eye pain, redness, hearing loss, ear pain, congestion, sore throat, rhinorrhea, sneezing, mouth sores, trouble swallowing, neck pain, neck stiffness and tinnitus.   Respiratory: Denies SOB, DOE, cough, chest tightness,  and wheezing.   Cardiovascular: Denies chest pain, palpitations and leg swelling.  Gastrointestinal: Denies nausea, vomiting, abdominal pain, diarrhea, constipation, blood in stool and abdominal distention.  Genitourinary: Denies dysuria, urgency, frequency, hematuria, flank pain and difficulty urinating.  Musculoskeletal: Denies myalgias, back pain, joint swelling, arthralgias and gait problem.  Skin:  Denies pallor, rash and wound.  Neurological: Denies dizziness, seizures, syncope, weakness, light-headedness, numbness and headaches.  Hematological: Denies adenopathy. Easy bruising, personal or family bleeding history  Psychiatric/Behavioral: Denies suicidal ideation, mood changes, confusion, nervousness, sleep disturbance and agitation   Physical  Exam: Blood pressure 112/73, pulse 99, temperature 97.6 F (36.4 C), temperature source Oral, resp. rate 20, height 5\' 5"  (1.651 m), weight 97.3 kg (214 lb 8.1 oz), SpO2 94.00%. General: Patient is lying in bed, she is alert and oriented x3,  HEENT: Normocephalic, atraumatic, pupils are equal and reactive to light Neck: Supple Chest bilateral expiratory wheezes with increased work of breathing Cardiac: S1, S2 regular rate and rhythm Abdomen: Soft, nontender, nondistended, bowel sounds active Extremities: No cyanosis, clubbing, or edema Neurological: grossly intact, nonfocal Skin: Intact, no visible rashes  Labs on Admission:  Results for orders placed during the hospital encounter of 05/01/11 (from the past 48 hour(s))  CBC     Status: Abnormal   Collection Time   05/01/11  7:31 AM      Component Value Range Comment   WBC 7.7  4.0 - 10.5 (K/uL)    RBC 4.76  3.87 - 5.11 (MIL/uL)    Hemoglobin 11.6 (*) 12.0 - 15.0 (g/dL)    HCT 16.1  09.6 - 04.5 (%)    MCV 76.9 (*) 78.0 - 100.0 (fL)    MCH 24.4 (*) 26.0 - 34.0 (pg)    MCHC 31.7  30.0 - 36.0 (g/dL)    RDW 40.9  81.1 - 91.4 (%)    Platelets 255  150 - 400 (K/uL)   DIFFERENTIAL     Status: Abnormal   Collection Time   05/01/11  7:31 AM      Component Value Range Comment   Neutrophils Relative 55  43 - 77 (%)    Neutro Abs 4.2  1.7 - 7.7 (K/uL)    Lymphocytes Relative 32  12 - 46 (%)    Lymphs Abs 2.5  0.7 - 4.0 (K/uL)    Monocytes Relative 7  3 - 12 (%)    Monocytes Absolute 0.5  0.1 - 1.0 (K/uL)    Eosinophils Relative 6 (*) 0 - 5 (%)    Eosinophils Absolute 0.5  0.0 - 0.7 (K/uL)    Basophils Relative 1  0 - 1 (%)    Basophils Absolute 0.0  0.0 - 0.1 (K/uL)   BASIC METABOLIC PANEL     Status: Abnormal   Collection Time   05/01/11  8:15 AM      Component Value Range Comment   Sodium 137  135 - 145 (mEq/L)    Potassium 3.8  3.5 - 5.1 (mEq/L)    Chloride 104  96 - 112 (mEq/L)    CO2 25  19 - 32 (mEq/L)    Glucose, Bld 112 (*)  70 - 99 (mg/dL)    BUN 8  6 - 23 (mg/dL)    Creatinine, Ser 7.82  0.50 - 1.10 (mg/dL)    Calcium 9.3  8.4 - 10.5 (mg/dL)    GFR calc non Af Amer >90  >90 (mL/min)    GFR calc Af Amer >90  >90 (mL/min)     Radiological Exams on Admission: Dg Chest Portable 1 View  05/01/2011  *RADIOLOGY REPORT*  Clinical Data: Shortness of breath.  PORTABLE CHEST - 1 VIEW  Comparison: 01/28/2011, 01/07/2011 and 01/07/2010.  Findings: Mild central pulmonary vascular prominence without pulmonary edema. Minimal right base subsegmental atelectasis.  No segmental  infiltrate or pneumothorax. Minimal peribronchial thickening.  The patient would eventually benefit from follow-up two-view chest with cardiac leads removed.  Heart size within normal limits.  IMPRESSION: Minimal right base subsegmental atelectasis and minimal peribronchial thickening.  Original Report Authenticated By: Fuller Canada, [name]    Assessment/Plan Principal Problem:  *COPD exacerbation Active Problems:  Acute bronchitis  Depression  Plan:  Patient will be admitted to the hospital for further treatment of her COPD exacerbation which was likely precipitated by a bronchitis.  She will receive scheduled and PRN neb treatments.  She will be started on intravenous steroids, antibiotics, mucolytics.  She will be provided with supplemental oxygen. Hopefully she should improve in the next 24-48hours.  Plans will be to discharge home when she is medically stable.  Time Spent on Admission:  Loy [facility] Pager: [phone] 05/01/2011, 5:16 PM

## 2011-05-01 NOTE — ED Notes (Signed)
Pt c/o sob, wheezing, became worse yesterday, home inhalers have not been helping, denies any fever, recent admission for pneumonia.

## 2011-05-01 NOTE — ED Notes (Signed)
Pt states that she feels like the breathing treatments are helping her. Pt and family updated on plan of care

## 2011-05-01 NOTE — ED Provider Notes (Signed)
History   This chart was scribed for Dione Booze, MD by Clarita Crane. The patient was seen in room APA06/APA06. Patient's care was started at 0713.    CSN: [id number]  Arrival date & time 05/01/11  4098   First MD Initiated Contact with Patient 05/01/11 0725      Chief Complaint  Patient presents with  . Shortness of Breath    (Consider location/radiation/quality/duration/timing/severity/associated sxs/prior treatment) HPI [name] is a 36 y.o. female who presents to the Emergency Department complaining of constant moderate to severe SOB onset yesterday and worsening since with associated non-productive cough and chest pain which is aggravated by coughing. Patient rates chest pain a 5 out of 10 at this time. States SOB minimally relieved with use of albuterol inhaler. Denies fever, chills, diaphoresis, myalgias. Patient with h/o COPD, asthma, anemia and former smoker (quit 3.5 months ago). Previously smoked 1 pack per day.   Past Medical History  Diagnosis Date  . COPD (chronic obstructive pulmonary disease)   . Asthma   . Depression   . Tobacco abuse   . Alcohol abuse   . Anemia 01/31/2011  . Hypoxia 02/01/2011    Past Surgical History  Procedure Date  . Back surgery   . Cesarean section   . Dilation and curettage of uterus   . Tubal ligation     Family History  Problem Relation Age of Onset  . Hypothyroidism Mother   . Coronary artery disease Mother     History  Substance Use Topics  . Smoking status: Current Everyday Smoker -- 1.0 packs/day    Types: Cigarettes  . Smokeless tobacco: Not on file  . Alcohol Use: No     "sober x 100 days"    OB History    Grav Para Term Preterm Abortions TAB SAB Ect Mult Living                  Review of Systems  Constitutional: Negative for fever, chills and diaphoresis.  Respiratory: Positive for cough and shortness of breath.   Cardiovascular: Positive for chest pain.  Musculoskeletal: Negative for  myalgias.  All other systems reviewed and are negative.    Allergies  Review of patient's allergies indicates no known allergies.  Home Medications   Current Outpatient Rx  Name Route Sig Dispense Refill  . ALBUTEROL SULFATE HFA 108 (90 BASE) MCG/ACT IN AERS Inhalation Inhale 2 puffs into the lungs 4 (four) times daily. Shortness of breath 1 Inhaler 1  . ALBUTEROL SULFATE (2.5 MG/3ML) 0.083% IN NEBU Nebulization Take 3 mLs (2.5 mg total) by nebulization every 6 (six) hours as needed for wheezing or shortness of breath. For asthma 75 mL 1  . FLUOXETINE HCL 40 MG PO CAPS Oral Take 40 mg by mouth daily.      . MOMETASONE FURO-FORMOTEROL FUM 100-5 MCG/ACT IN AERO Inhalation Inhale 2 puffs into the lungs 2 (two) times daily. 1 Inhaler 1  . PREDNISONE 10 MG PO TABS  TAKE 6 TABLETS STARTING TOMORROW FOR 1 DAY; THEN 5 TABLETS THE NEXT DAY; THEN 4 TABLETS THE NEXT DAY; THEN 3 TABLETS THE NEXT DAY; THEN 2 TABLETS THE NEXT DAY; THE 1 TABLET THE NEXT DAY; THEN STOP. 21 tablet 0    BP 110/52  Pulse 97  Temp(Src) 98.5 F (36.9 C) (Oral)  Resp 22  Ht 5\' 5"  (1.651 m)  Wt 220 lb (99.791 kg)  BMI 36.61 kg/m2  SpO2 93%  Physical Exam  Nursing note and  vitals reviewed. Constitutional: She is oriented to person, place, and time. She appears well-developed and well-nourished. She appears distressed (mild).  HENT:  Head: Normocephalic and atraumatic.  Eyes: EOM are normal. Pupils are equal, round, and reactive to light.  Neck: Neck supple. No tracheal deviation present.  Cardiovascular: Normal rate.   Pulmonary/Chest: She is in respiratory distress (mild). She has wheezes (diffuse). She has no rales. She exhibits no tenderness.       Slight use of accessory muscles of respiration.   Abdominal: Soft. Bowel sounds are normal. She exhibits no distension.  Musculoskeletal: Normal range of motion. She exhibits no edema.  Neurological: She is alert and oriented to person, place, and time. No sensory  deficit.  Skin: Skin is warm and dry.  Psychiatric: She has a normal mood and affect. Her behavior is normal.    ED Course  Procedures (including critical care time)  DIAGNOSTIC STUDIES: Oxygen Saturation is 91% on room air, low by my interpretation.    COORDINATION OF CARE: 7:30AM- Patient informed of current plan for treatment and evaluation and agrees with plan at this time.  8:45AM- Patient informed of imaging results. Patient still experiencing SOB although improved at this time. Wheezing still present with use of accessory muscles upon re-evaluation.  9:58AM-Patient sleeping comfortably at this time but lung exam unchanged from last. Will order additional breathing treatment.  10:46AM- Patient's breath sounds unchanged at this time, Recommend admission to hospital. Patient agrees.  11:14AM- Consult complete with hospitalist. Patient case explained and discussed. Patient will be admitted for further evaluation and treatment. Case has been discussed with Dr. Kerry Hough who agrees to admit the patient.  Results for orders placed during the hospital encounter of 05/01/11  CBC      Component Value Range   WBC 7.7  4.0 - 10.5 (K/uL)   RBC 4.76  3.87 - 5.11 (MIL/uL)   Hemoglobin 11.6 (*) 12.0 - 15.0 (g/dL)   HCT 16.1  09.6 - 04.5 (%)   MCV 76.9 (*) 78.0 - 100.0 (fL)   MCH 24.4 (*) 26.0 - 34.0 (pg)   MCHC 31.7  30.0 - 36.0 (g/dL)   RDW 40.9  81.1 - 91.4 (%)   Platelets 255  150 - 400 (K/uL)  DIFFERENTIAL      Component Value Range   Neutrophils Relative 55  43 - 77 (%)   Neutro Abs 4.2  1.7 - 7.7 (K/uL)   Lymphocytes Relative 32  12 - 46 (%)   Lymphs Abs 2.5  0.7 - 4.0 (K/uL)   Monocytes Relative 7  3 - 12 (%)   Monocytes Absolute 0.5  0.1 - 1.0 (K/uL)   Eosinophils Relative 6 (*) 0 - 5 (%)   Eosinophils Absolute 0.5  0.0 - 0.7 (K/uL)   Basophils Relative 1  0 - 1 (%)   Basophils Absolute 0.0  0.0 - 0.1 (K/uL)  BASIC METABOLIC PANEL      Component Value Range   Sodium 137  135 -  145 (mEq/L)   Potassium 3.8  3.5 - 5.1 (mEq/L)   Chloride 104  96 - 112 (mEq/L)   CO2 25  19 - 32 (mEq/L)   Glucose, Bld 112 (*) 70 - 99 (mg/dL)   BUN 8  6 - 23 (mg/dL)   Creatinine, Ser 7.82  0.50 - 1.10 (mg/dL)   Calcium 9.3  8.4 - 95.6 (mg/dL)   GFR calc non Af Amer >90  >90 (mL/min)   GFR calc Af Amer >  90  >90 (mL/min)   Dg Chest Portable 1 View  05/01/2011  *RADIOLOGY REPORT*  Clinical Data: Shortness of breath.  PORTABLE CHEST - 1 VIEW  Comparison: 01/28/2011, 01/07/2011 and 01/07/2010.  Findings: Mild central pulmonary vascular prominence without pulmonary edema. Minimal right base subsegmental atelectasis.  No segmental infiltrate or pneumothorax. Minimal peribronchial thickening.  The patient would eventually benefit from follow-up two-view chest with cardiac leads removed.  Heart size within normal limits.  IMPRESSION: Minimal right base subsegmental atelectasis and minimal peribronchial thickening.  Original Report Authenticated By: Fuller Canada, [name]      1. COPD exacerbation    CRITICAL CARE Performed by: Dione Booze   Total critical care time: 45 minutes  Critical care time was exclusive of separately billable procedures and treating other patients.  Critical care was necessary to treat or prevent imminent or life-threatening deterioration.  Critical care was time spent personally by me on the following activities: development of treatment plan with patient and/or surrogate as well as nursing, discussions with consultants, evaluation of patient's response to treatment, examination of patient, obtaining history from patient or surrogate, ordering and performing treatments and interventions, ordering and review of laboratory studies, ordering and review of radiographic studies, pulse oximetry and re-evaluation of patient's condition.    MDM  Exacerbation of COPD which is not responding to home treatments with beta agonists. Chest x-ray or be obtained to rule out  pneumonia, she will be given a dose of Solu-Medrol and albuterol nebulizer treatments. Old records have been reviewed, and she had been admitted for COPD exacerbation in December 2012.      I personally performed the services described in this documentation, which was scribed in my presence. The recorded information has been reviewed and considered.      Dione Booze, MD 05/01/11 [phone]

## 2011-05-01 NOTE — ED Notes (Signed)
Report called to the floor.

## 2011-05-01 NOTE — ED Notes (Signed)
MD at bedside. 

## 2011-05-01 NOTE — ED Notes (Signed)
Pt ambulatory to restroom, tolerated well, pt does state that she did get sob but that the sob is better. Pt and family updated on plan of care.

## 2011-05-02 LAB — BASIC METABOLIC PANEL
BUN: 9 mg/dL (ref 6–23)
CO2: 23 mEq/L (ref 19–32)
Chloride: 104 mEq/L (ref 96–112)
GFR calc Af Amer: >90 mL/min (ref >90)
Potassium: 4.4 mEq/L (ref 3.5–5.1)

## 2011-05-02 LAB — CBC
HCT: 34.7 % — ABNORMAL LOW (ref 36.0–46.0)
Hemoglobin: 10.8 g/dL — ABNORMAL LOW (ref 12.0–15.0)
MCHC: 31.1 g/dL (ref 30.0–36.0)
MCV: 77.3 fL — ABNORMAL LOW (ref 78.0–100.0)
WBC: 12.5 10*3/uL — ABNORMAL HIGH (ref 4.0–10.5)

## 2011-05-02 MED ORDER — AMOXICILLIN 500 MG PO CAPS: 500.0000 mg | ORAL_CAPSULE | Freq: Three times a day (TID) | ORAL | Status: AC

## 2011-05-02 MED ORDER — ALBUTEROL SULFATE HFA 108 (90 BASE) MCG/ACT IN AERS: 2.0000 | INHALATION_SPRAY | Freq: Four times a day (QID) | RESPIRATORY_TRACT | Status: DC | PRN

## 2011-05-02 MED ORDER — PREDNISONE 20 MG PO TABS: ORAL_TABLET | ORAL | Status: AC

## 2011-05-02 NOTE — Progress Notes (Signed)
Discharge instructions and prescriptions given, verbalized understanding, out ambulatory in stable condition with family member present.

## 2011-05-02 NOTE — Discharge Summary (Signed)
Physician Discharge Summary  Patient ID: [name] MRN: [id number] DOB/AGE: [birth year] 36 y.o. Primary Care Physician:HASANAJ,XAJE A, MD, MD Admit date: 05/01/2011 Discharge date: 05/02/2011    Discharge Diagnoses:  1. COPD exacerbation, improved. 2. Acute bronchitis. 3. Obesity. 4. Depression, stable.   Medication List  As of 05/02/2011 12:29 PM   TAKE these medications         albuterol 108 (90 BASE) MCG/ACT inhaler   Commonly known as: PROVENTIL HFA;VENTOLIN HFA   Inhale 2 puffs into the lungs 4 (four) times daily. Shortness of breath      albuterol (2.5 MG/3ML) 0.083% nebulizer solution   Commonly known as: PROVENTIL   Take 3 mLs (2.5 mg total) by nebulization every 6 (six) hours as needed for wheezing or shortness of breath. For asthma      albuterol 108 (90 BASE) MCG/ACT inhaler   Commonly known as: PROVENTIL HFA;VENTOLIN HFA   Inhale 2 puffs into the lungs every 6 (six) hours as needed for wheezing.      amoxicillin 500 MG capsule   Commonly known as: AMOXIL   Take 1 capsule (500 mg total) by mouth 3 (three) times daily.      FLUoxetine 40 MG capsule   Commonly known as: PROZAC   Take 40 mg by mouth at bedtime.      ipratropium 0.02 % nebulizer solution   Commonly known as: ATROVENT   Take 500 mcg by nebulization 4 (four) times daily. Mixes with the albuterol solution      mometasone-formoterol 100-5 MCG/ACT Aero   Commonly known as: DULERA   Inhale 2 puffs into the lungs 2 (two) times daily.      predniSONE 20 MG tablet   Commonly known as: DELTASONE   Take 2 tablets daily for 3 days, then 1 tablet daily for 3 days, then half tablet daily for 3 days, then STOP.            Discharged Condition: Stable and improved.    Consults: None.  Significant Diagnostic Studies: Dg Chest Portable 1 View  05/01/2011  *RADIOLOGY REPORT*  Clinical Data: Shortness of breath.  PORTABLE CHEST - 1 VIEW  Comparison: 01/28/2011, 01/07/2011 and 01/07/2010.   Findings: Mild central pulmonary vascular prominence without pulmonary edema. Minimal right base subsegmental atelectasis.  No segmental infiltrate or pneumothorax. Minimal peribronchial thickening.  The patient would eventually benefit from follow-up two-view chest with cardiac leads removed.  Heart size within normal limits.  IMPRESSION: Minimal right base subsegmental atelectasis and minimal peribronchial thickening.  Original Report Authenticated By: Fuller Canada, [name]    Lab Results: Basic Metabolic Panel:  Basename 05/02/11 0610 05/01/11 0815  NA 137 137  K 4.4 3.8  CL 104 104  CO2 23 25  GLUCOSE 148* 112*  BUN 9 8  CREATININE 0.64 0.75  CALCIUM 10.1 9.3  MG -- --  PHOS -- --       CBC:  Basename 05/02/11 0610 05/01/11 0731  WBC 12.5* 7.7  NEUTROABS -- 4.2  HGB 10.8* 11.6*  HCT 34.7* 36.6  MCV 77.3* 76.9*  PLT 332 255    No results found for this or any previous visit (from the past 240 hour(s)).   Hospital Course: This 36 year old lady was admitted yesterday with symptoms of dyspnea. For the last couple of months she has had dyspnea on a frequent basis at home. For the last 3-4 days she has noticed increasing in her cough which is productive. She then started to  have wheezing which brought her to the emergency room. She felt better after bronchodilators even in the emergency room. She feels much improved now since she has had steroids as well as antibiotics. She quit smoking cigarettes a few months ago.  Discharge Exam: Blood pressure 111/76, pulse 76, temperature 97.7 F (36.5 C), temperature source Oral, resp. rate 22, height 5\' 5"  (1.651 m), weight 97.3 kg (214 lb 8.1 oz), SpO2 95.00%. She looks systemically well. There is no increased work of breathing. Lung fields show bilateral wheezing which is not tight and is scattered. Heart sounds are present and normal. There is no peripheral or central cyanosis. She is alert and orientated.  Disposition: Home. She will  require tapering course of prednisone and antibiotics. She will followup with her primary care physician in the next 1-2 weeks.  Discharge Orders    Future Orders Please Complete By Expires   Diet - low sodium heart healthy      Increase activity slowly         Follow-up Information    Follow up with [facility], MD .         SignedWilson Singer Pager [phone]  05/02/2011, 12:29 PM

## 2011-05-27 ENCOUNTER — Emergency Department (HOSPITAL_COMMUNITY)
Admission: EM | Admit: 2011-05-27 | Discharge: 2011-05-28 | Disposition: A | Discharge status: Home / Self Care | Payer: Self-pay | Attending: Emergency Medicine | Admitting: Emergency Medicine

## 2011-05-27 ENCOUNTER — Encounter (HOSPITAL_COMMUNITY): Payer: Self-pay | Admitting: *Deleted

## 2011-05-27 DIAGNOSIS — J4489 Other specified chronic obstructive pulmonary disease: Secondary | ICD-10-CM | POA: Insufficient documentation

## 2011-05-27 DIAGNOSIS — F3289 Other specified depressive episodes: Secondary | ICD-10-CM | POA: Insufficient documentation

## 2011-05-27 DIAGNOSIS — M5416 Radiculopathy, lumbar region: Secondary | ICD-10-CM

## 2011-05-27 DIAGNOSIS — J449 Chronic obstructive pulmonary disease, unspecified: Secondary | ICD-10-CM | POA: Insufficient documentation

## 2011-05-27 DIAGNOSIS — Z79899 Other long term (current) drug therapy: Secondary | ICD-10-CM | POA: Insufficient documentation

## 2011-05-27 DIAGNOSIS — F329 Major depressive disorder, single episode, unspecified: Secondary | ICD-10-CM | POA: Insufficient documentation

## 2011-05-27 DIAGNOSIS — M545 Low back pain, unspecified: Secondary | ICD-10-CM | POA: Insufficient documentation

## 2011-05-27 DIAGNOSIS — IMO0002 Reserved for concepts with insufficient information to code with codable children: Secondary | ICD-10-CM | POA: Insufficient documentation

## 2011-05-27 MED ORDER — ONDANSETRON HCL 4 MG/2ML IJ SOLN
4.0000 mg | Freq: Once | INTRAMUSCULAR | Status: AC
  Administered 2011-05-28: 4 mg via INTRAVENOUS
  Filled 2011-05-27: qty 2

## 2011-05-27 MED ORDER — DIAZEPAM 5 MG PO TABS
5.0000 mg | ORAL_TABLET | Freq: Once | ORAL | Status: AC
  Administered 2011-05-27: 5 mg via ORAL
  Filled 2011-05-27: qty 1

## 2011-05-27 MED ORDER — HYDROMORPHONE HCL PF 1 MG/ML IJ SOLN
1.0000 mg | Freq: Once | INTRAMUSCULAR | Status: AC
  Administered 2011-05-28: 1 mg via INTRAVENOUS
  Filled 2011-05-27: qty 1

## 2011-05-27 MED ORDER — SODIUM CHLORIDE 0.9 % IV SOLN
Freq: Once | INTRAVENOUS | Status: AC
  Administered 2011-05-27: 20 mL/h via INTRAVENOUS

## 2011-05-27 NOTE — ED Provider Notes (Signed)
History     CSN: [id number]  Arrival date & time 05/27/11  2114   First MD Initiated Contact with Patient 05/27/11 2245      Chief Complaint  Patient presents with  . Back Pain    Pt was coming out of [employer] and sneezed which cause back pain to start    (Consider location/radiation/quality/duration/timing/severity/associated sxs/prior treatment) HPI Comments: Patient c/o pain to her right lower back that began suddenly this evening after she sneezed.  Describes a sharp stabbing type pain in her right back that radiates down her leg to her foot.  She denies fall, weakness, numbness or incontinence of urine or feces.  States pain feels same as when she "blew a disk out" several years ago.    Patient is a 36 y.o. female presenting with back pain. The history is provided by the patient.  Back Pain  This is a new problem. The current episode started 3 to 5 hours ago. The problem occurs constantly. The problem has not changed since onset.Associated with: sneezing. The pain is present in the lumbar spine and sacro-iliac joint. The quality of the pain is described as stabbing, shooting and burning. The pain radiates to the right thigh, right knee and right foot. The pain is severe. The symptoms are aggravated by bending, twisting and certain positions. The pain is the same all the time. Associated symptoms include leg pain. Pertinent negatives include no chest pain, no fever, no numbness, no abdominal pain, no abdominal swelling, no bowel incontinence, no perianal numbness, no bladder incontinence, no dysuria, no pelvic pain, no paresthesias, no paresis, no tingling and no weakness. Associated symptoms comments: Muscle spasms. She has tried nothing for the symptoms. The treatment provided no relief.    Past Medical History  Diagnosis Date  . COPD (chronic obstructive pulmonary disease)   . Asthma   . Depression   . Tobacco abuse   . Alcohol abuse   . Anemia 01/31/2011  . Hypoxia  02/01/2011    Past Surgical History  Procedure Date  . Back surgery   . Cesarean section   . Dilation and curettage of uterus     Family History  Problem Relation Age of Onset  . Hypothyroidism Mother   . Coronary artery disease Mother     History  Substance Use Topics  . Smoking status: Former Smoker -- 1.0 packs/day    Types: Cigarettes  . Smokeless tobacco: Not on file  . Alcohol Use: No     sober since november    OB History    Grav Para Term Preterm Abortions TAB SAB Ect Mult Living                  Review of Systems  Constitutional: Negative for fever, activity change and appetite change.  Respiratory: Negative for chest tightness and shortness of breath.   Cardiovascular: Negative for chest pain.  Gastrointestinal: Negative for nausea, vomiting, abdominal pain and bowel incontinence.  Genitourinary: Negative for bladder incontinence, dysuria, frequency, flank pain, difficulty urinating, vaginal pain and pelvic pain.  Musculoskeletal: Positive for back pain.  Skin: Negative.   Neurological: Negative for tingling, weakness, numbness and paresthesias.  All other systems reviewed and are negative.    Allergies  Review of patient's allergies indicates no known allergies.  Home Medications   Current Outpatient Rx  Name Route Sig Dispense Refill  . ALBUTEROL SULFATE HFA 108 (90 BASE) MCG/ACT IN AERS Inhalation Inhale 2 puffs into the lungs every 6 (  six) hours as needed for wheezing. 1 Inhaler 2  . ALBUTEROL SULFATE (2.5 MG/3ML) 0.083% IN NEBU Nebulization Take 3 mLs (2.5 mg total) by nebulization every 6 (six) hours as needed for wheezing or shortness of breath. For asthma 75 mL 1  . FLUOXETINE HCL 40 MG PO CAPS Oral Take 40 mg by mouth at bedtime.     . MOMETASONE FURO-FORMOTEROL FUM 100-5 MCG/ACT IN AERO Inhalation Inhale 2 puffs into the lungs 2 (two) times daily. 1 Inhaler 1  . ALBUTEROL SULFATE HFA 108 (90 BASE) MCG/ACT IN AERS Inhalation Inhale 2 puffs  into the lungs 4 (four) times daily. Shortness of breath 1 Inhaler 1  . IPRATROPIUM BROMIDE 0.02 % IN SOLN Nebulization Take 500 mcg by nebulization 4 (four) times daily. Mixes with the albuterol solution      BP 104/68  Pulse 87  Temp(Src) 98.1 F (36.7 C) (Oral)  Resp 22  Ht 5\' 5"  (1.651 m)  Wt 212 lb (96.163 kg)  BMI 35.28 kg/m2  SpO2 91%  LMP 04/25/2011  Physical Exam  Nursing note and vitals reviewed. Constitutional: She is oriented to person, place, and time. She appears well-developed and well-nourished.       uncomfortable appearing  HENT:  Head: Normocephalic and atraumatic.  Neck: Neck supple.  Cardiovascular: Normal rate, regular rhythm, normal heart sounds and intact distal pulses.   No murmur heard. Pulmonary/Chest: Effort normal and breath sounds normal. No respiratory distress.  Abdominal: Soft. She exhibits no distension. There is no tenderness.  Musculoskeletal: Normal range of motion. She exhibits tenderness. She exhibits no edema.       Lumbar back: She exhibits tenderness and pain. She exhibits normal range of motion, no bony tenderness, no swelling, no edema, no deformity and normal pulse.       Back:       ttp of the right lumbar paraspinal muscles and SI joint  Neurological: She is alert and oriented to person, place, and time. No cranial nerve deficit. She exhibits normal muscle tone. Coordination normal.  Skin: Skin is warm and dry.    ED Course  Procedures (including critical care time)       Results for orders placed during the hospital encounter of 05/27/11  URINALYSIS, ROUTINE W REFLEX MICROSCOPIC      Component Value Range   Color, Urine YELLOW  YELLOW    APPearance CLEAR  CLEAR    Specific Gravity, Urine 1.010  1.005 - 1.030    pH 7.0  5.0 - 8.0    Glucose, UA NEGATIVE  NEGATIVE (mg/dL)   Hgb urine dipstick NEGATIVE  NEGATIVE    Bilirubin Urine NEGATIVE  NEGATIVE    Ketones, ur 15 (*) NEGATIVE (mg/dL)   Protein, ur NEGATIVE  NEGATIVE  (mg/dL)   Urobilinogen, UA 0.2  0.0 - 1.0 (mg/dL)   Nitrite NEGATIVE  NEGATIVE    Leukocytes, UA NEGATIVE  NEGATIVE       MDM    Previous ED charts and nursing notes were reviewed by me.   TREATMENT IN THE ED:  IV Dilaudid, Zofran and po valium    PRESCRIPTIONS GIVEN AT DISCHARGE:  Percocet, valium and prednisone   Patient had a plain film of the L spine 12/08 without acute abnml, MRI of Lumbar spine from 07/04/05 showed a small central disk protrusion with downward turning of the disk at L2-4 and broad based disk protrusion at L4-5   Patient is feeling better, ttp of the right lumbar paraspinal muscles  and SI joint space.  Pain is reproduced with SLR on right.  No focal neuro deficits on exam.  Moves her right foot and leg w/o difficulty while in supine position. Pain likely related to lumbar radiculopathy.  Pt does not have insurance or PMD at present.  I have advised her to return here if symptoms worsen and I will also give her referral for PMD.  Patient verbalized understanding and agrees to the care plan.     I have discussed pt hx and care plan with the EDP    Patient / Family / Caregiver understand and agree with initial ED impression and plan with expectations set for ED visit. Pt stable in ED with no significant deterioration in condition. Pt feels improved after observation and/or treatment in ED.          Sharday Michl L. Jakyla Reza, PA 05/28/11 0224  Kresha Abelson L. Isidor Bromell, [place] 05/28/11 0225

## 2011-05-27 NOTE — ED Notes (Signed)
Screams out at intervals with pain - states her back spasms - ice pack to back, pillow under knees to assist with comfort.  Supine on stretcher

## 2011-05-28 LAB — URINALYSIS, ROUTINE W REFLEX MICROSCOPIC
Bilirubin Urine: NEGATIVE
Glucose, UA: NEGATIVE mg/dL
Hgb urine dipstick: NEGATIVE
Specific Gravity, Urine: 1.01 (ref 1.005–1.030)

## 2011-05-28 MED ORDER — OXYCODONE-ACETAMINOPHEN 7.5-325 MG PO TABS: 1.0000 | ORAL_TABLET | ORAL | Status: AC | PRN

## 2011-05-28 MED ORDER — PREDNISONE 10 MG PO TABS: ORAL_TABLET | ORAL | Status: DC

## 2011-05-28 MED ORDER — DIAZEPAM 5 MG PO TABS: 5.0000 mg | ORAL_TABLET | Freq: Four times a day (QID) | ORAL | Status: AC | PRN

## 2011-05-28 MED ORDER — HYDROMORPHONE HCL PF 1 MG/ML IJ SOLN
1.0000 mg | Freq: Once | INTRAMUSCULAR | Status: AC
  Administered 2011-05-28: 1 mg via INTRAVENOUS
  Filled 2011-05-28: qty 1

## 2011-05-28 NOTE — ED Notes (Signed)
States the Valium (pill) helped the spasms but still had pain - IV Zofran and Hydromorphone given.

## 2011-05-28 NOTE — Discharge Instructions (Signed)

## 2011-05-28 NOTE — ED Provider Notes (Signed)
Medical screening examination/treatment/procedure(s) were performed by non-physician practitioner and as supervising physician I was immediately available for consultation/collaboration.   Shirlene Andaya W. Cherysh Epperly, MD 05/28/11 1250 

## 2011-09-10 ENCOUNTER — Inpatient Hospital Stay (HOSPITAL_COMMUNITY)
Admission: RE | Admit: 2011-09-10 | Discharge: 2011-09-13 | DRG: 885 | Disposition: A | Discharge status: Home / Self Care | Payer: 59 | Source: Ambulatory Visit | Attending: Psychiatry | Admitting: Psychiatry

## 2011-09-10 ENCOUNTER — Encounter (HOSPITAL_COMMUNITY): Payer: Self-pay

## 2011-09-10 ENCOUNTER — Emergency Department (HOSPITAL_COMMUNITY)
Admission: EM | Admit: 2011-09-10 | Discharge: 2011-09-10 | Disposition: A | Discharge status: Other Acute Inpatient Hospital | Payer: Medicaid Other | Attending: Emergency Medicine | Admitting: Emergency Medicine

## 2011-09-10 DIAGNOSIS — T50901A Poisoning by unspecified drugs, medicaments and biological substances, accidental (unintentional), initial encounter: Secondary | ICD-10-CM | POA: Insufficient documentation

## 2011-09-10 DIAGNOSIS — Z9149 Other personal history of psychological trauma, not elsewhere classified: Secondary | ICD-10-CM

## 2011-09-10 DIAGNOSIS — F329 Major depressive disorder, single episode, unspecified: Secondary | ICD-10-CM

## 2011-09-10 DIAGNOSIS — R45851 Suicidal ideations: Secondary | ICD-10-CM

## 2011-09-10 DIAGNOSIS — J449 Chronic obstructive pulmonary disease, unspecified: Secondary | ICD-10-CM | POA: Diagnosis present

## 2011-09-10 DIAGNOSIS — A499 Bacterial infection, unspecified: Secondary | ICD-10-CM | POA: Insufficient documentation

## 2011-09-10 DIAGNOSIS — F1994 Other psychoactive substance use, unspecified with psychoactive substance-induced mood disorder: Secondary | ICD-10-CM | POA: Diagnosis present

## 2011-09-10 DIAGNOSIS — F17200 Nicotine dependence, unspecified, uncomplicated: Secondary | ICD-10-CM | POA: Diagnosis present

## 2011-09-10 DIAGNOSIS — F1910 Other psychoactive substance abuse, uncomplicated: Secondary | ICD-10-CM | POA: Diagnosis present

## 2011-09-10 DIAGNOSIS — F332 Major depressive disorder, recurrent severe without psychotic features: Principal | ICD-10-CM | POA: Diagnosis present

## 2011-09-10 DIAGNOSIS — B9689 Other specified bacterial agents as the cause of diseases classified elsewhere: Secondary | ICD-10-CM | POA: Insufficient documentation

## 2011-09-10 DIAGNOSIS — J4489 Other specified chronic obstructive pulmonary disease: Secondary | ICD-10-CM | POA: Diagnosis present

## 2011-09-10 DIAGNOSIS — Z79899 Other long term (current) drug therapy: Secondary | ICD-10-CM

## 2011-09-10 DIAGNOSIS — N760 Acute vaginitis: Secondary | ICD-10-CM | POA: Insufficient documentation

## 2011-09-10 DIAGNOSIS — T50902A Poisoning by unspecified drugs, medicaments and biological substances, intentional self-harm, initial encounter: Secondary | ICD-10-CM | POA: Insufficient documentation

## 2011-09-10 DIAGNOSIS — F3289 Other specified depressive episodes: Secondary | ICD-10-CM | POA: Insufficient documentation

## 2011-09-10 DIAGNOSIS — Z720 Tobacco use: Secondary | ICD-10-CM | POA: Diagnosis present

## 2011-09-10 HISTORY — DX: Encounter for other specified special examinations: Z01.89

## 2011-09-10 LAB — BASIC METABOLIC PANEL
Calcium: 9.7 mg/dL (ref 8.4–10.5)
Chloride: 101 mEq/L (ref 96–112)
Creatinine, Ser: 0.7 mg/dL (ref 0.50–1.10)
GFR calc Af Amer: >90 mL/min (ref >90)
GFR calc non Af Amer: >90 mL/min (ref >90)

## 2011-09-10 LAB — URINALYSIS, ROUTINE W REFLEX MICROSCOPIC
Ketones, ur: NEGATIVE mg/dL
Leukocytes, UA: NEGATIVE
Nitrite: NEGATIVE
Protein, ur: NEGATIVE mg/dL
Urobilinogen, UA: 0.2 mg/dL (ref 0.0–1.0)

## 2011-09-10 LAB — RAPID URINE DRUG SCREEN, HOSP PERFORMED: Barbiturates: NOT DETECTED

## 2011-09-10 LAB — CBC WITH DIFFERENTIAL/PLATELET
Basophils Absolute: 0 10*3/uL (ref 0.0–0.1)
Basophils Relative: 0 % (ref 0–1)
HCT: 34.7 % — ABNORMAL LOW (ref 36.0–46.0)
MCHC: 32 g/dL (ref 30.0–36.0)
Monocytes Absolute: 0.5 10*3/uL (ref 0.1–1.0)
Neutro Abs: 3 10*3/uL (ref 1.7–7.7)
RDW: 15.5 % (ref 11.5–15.5)

## 2011-09-10 LAB — URINE MICROSCOPIC-ADD ON

## 2011-09-10 LAB — ETHANOL: Alcohol, Ethyl (B): 83 mg/dL — ABNORMAL HIGH (ref 0–11)

## 2011-09-10 LAB — SALICYLATE LEVEL: Salicylate Lvl: <2 mg/dL — ABNORMAL LOW (ref 2.8–20.0)

## 2011-09-10 MED ORDER — IPRATROPIUM BROMIDE 0.02 % IN SOLN: 0.5000 mg | Freq: Four times a day (QID) | RESPIRATORY_TRACT | Status: DC | PRN

## 2011-09-10 MED ORDER — ALUM & MAG HYDROXIDE-SIMETH 200-200-20 MG/5ML PO SUSP: 30.0000 mL | ORAL | Status: DC | PRN

## 2011-09-10 MED ORDER — ALBUTEROL SULFATE (5 MG/ML) 0.5% IN NEBU: 5.0000 mg | INHALATION_SOLUTION | Freq: Four times a day (QID) | RESPIRATORY_TRACT | Status: DC | PRN

## 2011-09-10 MED ORDER — ZOLPIDEM TARTRATE 5 MG PO TABS: 10.0000 mg | ORAL_TABLET | Freq: Every evening | ORAL | Status: DC | PRN

## 2011-09-10 MED ORDER — ONDANSETRON HCL 4 MG PO TABS
4.0000 mg | ORAL_TABLET | Freq: Four times a day (QID) | ORAL | Status: DC | PRN
  Filled 2011-09-10: qty 1

## 2011-09-10 MED ORDER — SODIUM CHLORIDE 0.9 % IV BOLUS (SEPSIS)
1000.0000 mL | Freq: Once | INTRAVENOUS | Status: AC
  Administered 2011-09-10: 1000 mL via INTRAVENOUS

## 2011-09-10 MED ORDER — IBUPROFEN 400 MG PO TABS: 600.0000 mg | ORAL_TABLET | Freq: Three times a day (TID) | ORAL | Status: DC | PRN

## 2011-09-10 MED ORDER — NICOTINE 21 MG/24HR TD PT24
21.0000 mg | MEDICATED_PATCH | Freq: Every day | TRANSDERMAL | Status: DC
  Administered 2011-09-11 – 2011-09-13 (×3): 21 mg via TRANSDERMAL
  Filled 2011-09-10: qty 1
  Filled 2011-09-10: qty 14
  Filled 2011-09-10 (×4): qty 1

## 2011-09-10 MED ORDER — MAGNESIUM HYDROXIDE 400 MG/5ML PO SUSP: 30.0000 mL | Freq: Every day | ORAL | Status: DC | PRN

## 2011-09-10 MED ORDER — MOMETASONE FURO-FORMOTEROL FUM 100-5 MCG/ACT IN AERO
2.0000 | INHALATION_SPRAY | Freq: Two times a day (BID) | RESPIRATORY_TRACT | Status: DC
  Administered 2011-09-10 – 2011-09-13 (×6): 2 via RESPIRATORY_TRACT
  Filled 2011-09-10: qty 13

## 2011-09-10 MED ORDER — ONDANSETRON HCL 4 MG PO TABS: 4.0000 mg | ORAL_TABLET | Freq: Three times a day (TID) | ORAL | Status: DC | PRN

## 2011-09-10 MED ORDER — ACETAMINOPHEN 325 MG PO TABS: 650.0000 mg | ORAL_TABLET | ORAL | Status: DC | PRN

## 2011-09-10 MED ORDER — TRAZODONE HCL 100 MG PO TABS
100.0000 mg | ORAL_TABLET | Freq: Every day | ORAL | Status: DC
  Administered 2011-09-10 – 2011-09-12 (×3): 100 mg via ORAL
  Filled 2011-09-10 (×4): qty 1
  Filled 2011-09-10: qty 3
  Filled 2011-09-10: qty 1

## 2011-09-10 MED ORDER — NICOTINE 21 MG/24HR TD PT24
21.0000 mg | MEDICATED_PATCH | Freq: Every day | TRANSDERMAL | Status: DC
Start: 2011-09-10 — End: 2011-09-10
  Administered 2011-09-10: 21 mg via TRANSDERMAL
  Filled 2011-09-10: qty 1

## 2011-09-10 MED ORDER — ALBUTEROL SULFATE HFA 108 (90 BASE) MCG/ACT IN AERS
2.0000 | INHALATION_SPRAY | Freq: Four times a day (QID) | RESPIRATORY_TRACT | Status: DC | PRN
  Administered 2011-09-11 – 2011-09-12 (×2): 2 via RESPIRATORY_TRACT
  Filled 2011-09-10: qty 6.7

## 2011-09-10 MED ORDER — LORAZEPAM 1 MG PO TABS: 1.0000 mg | ORAL_TABLET | Freq: Three times a day (TID) | ORAL | Status: DC | PRN

## 2011-09-10 MED ORDER — IPRATROPIUM BROMIDE 0.02 % IN SOLN
500.0000 ug | Freq: Four times a day (QID) | RESPIRATORY_TRACT | Status: DC
  Administered 2011-09-10 – 2011-09-13 (×4): 500 ug via RESPIRATORY_TRACT
  Filled 2011-09-10 (×20): qty 2.5

## 2011-09-10 MED ORDER — ACETAMINOPHEN 325 MG PO TABS: 650.0000 mg | ORAL_TABLET | Freq: Four times a day (QID) | ORAL | Status: DC | PRN

## 2011-09-10 MED ORDER — METRONIDAZOLE 500 MG PO TABS
500.0000 mg | ORAL_TABLET | Freq: Three times a day (TID) | ORAL | Status: DC
  Administered 2011-09-10: 500 mg via ORAL
  Filled 2011-09-10: qty 1

## 2011-09-10 NOTE — Progress Notes (Signed)
Psychoeducational Group Note  Date:  09/10/2011 Time:  1515  Group Topic/Focus:  Conflict Resolution:   The focus of this group is to discuss the conflict resolution process and how it may be used upon discharge.  Participation Level:  Did Not Attend  Participation Quality:    Affect:    Cognitive:    Insight:    Engagement in Group:    Additional Comments: pt just got here, RN told pt, that she could rest for awhile.  Isla Pence M 09/10/2011, 4:41 PM

## 2011-09-10 NOTE — ED Notes (Signed)
Pt has multiple visitors at reg. Desk, pt agreed to see visitors, 1 at a time to exam room, sitter remains at bedside throughout.  Pt alert and able to answer all ?'s.

## 2011-09-10 NOTE — ED Notes (Signed)
Spoke w/ poison control & advised peaks to be between 4 to 6 hours. EDP notified of poison controls recommendations.

## 2011-09-10 NOTE — ED Notes (Signed)
Pt resting in bed, voices no complaints, sitter at bedside.

## 2011-09-10 NOTE — Tx Team (Signed)
Initial Interdisciplinary Treatment Plan  PATIENT STRENGTHS: (choose at least two) Ability for insight Average or above average intelligence General fund of knowledge Motivation for treatment/growth Supportive family/friends Work skills  PATIENT STRESSORS: Financial difficulties Occupational concerns Substance abuse Anniversary of the loss of her cousin   PROBLEM LIST: Problem List/Patient Goals Date to be addressed Date deferred Reason deferred Estimated date of resolution  Depression 7/28     Anxiety 7/28     Substance abuse(Etoh) 7/28     Decreased work status (hours) 7/28     Relationship problems with ex-gf attempting to resolve issues. 7/28     Increased risk for SI 7/28                        DISCHARGE CRITERIA:  Ability to meet basic life and health needs Improved stabilization in mood, thinking, and/or behavior Medical problems require only outpatient monitoring Motivation to continue treatment in a less acute level of care Need for constant or close observation no longer present Reduction of life-threatening or endangering symptoms to within safe limits Verbal commitment to aftercare and medication compliance  PRELIMINARY DISCHARGE PLAN: Attend PHP/IOP Attend 12-step recovery group Outpatient therapy Return to previous living arrangement  PATIENT/FAMIILY INVOLVEMENT: This treatment plan has been presented to and reviewed with the patient, [name].  The patient and family have been given the opportunity to ask questions and make suggestions.  Brittie Whisnant A 09/10/2011, 10:29 PM

## 2011-09-10 NOTE — ED Notes (Signed)
Poison control called for update on pt.  

## 2011-09-10 NOTE — Tx Team (Signed)
Initial Interdisciplinary Treatment Plan  PATIENT STRENGTHS: (choose at least two) Ability for insight Motivation for treatment/growth  PATIENT STRESSORS: Loss of relationship, recent breakup with friend* Traumatic event   PROBLEM LIST: Problem List/Patient Goals Date to be addressed Date deferred Reason deferred Estimated date of resolution  SI 09/10/2011     Depression 09/10/2011                                                DISCHARGE CRITERIA:  Ability to meet basic life and health needs Adequate post-discharge living arrangements Improved stabilization in mood, thinking, and/or behavior Need for constant or close observation no longer present  PRELIMINARY DISCHARGE PLAN: Return to previous living arrangement  PATIENT/FAMIILY INVOLVEMENT: This treatment plan has been presented to and reviewed with the patient, RAYLEY GAO, and/or family member.  The patient and family have been given the opportunity to ask questions and make suggestions.  Floyce Stakes 09/10/2011, 10:14 PM

## 2011-09-10 NOTE — Progress Notes (Signed)
Admission note:  Patient is a 36 y/o w/f admitted voluntarily from [facility] following medical clearance.  Was transported to ED following reported ingestion of 20-40 Prozac tablets in a suicide attempt. Patient reports intense stress related to breakup with GF of 2 years and financial stressors r/t not having enough hours at her job. Also reports nightly drinking of 6-8 beers with last use 7/27. Denies history of etoh related seizures or other illicit drug use. No history of prior psychiatric hospitilizations.  Medical history of asthma, COPD,and previous c-section x1. Presents with active n/v. Sad affect with depressed mood and cooperative behavior.  Belonging check done with skin search. She denies current SI and contracts for safety. Fluids offered. Orientation to unit done and 15' checks initiated for safety.

## 2011-09-10 NOTE — ED Notes (Signed)
Pt's mother to bedside to speak to pt, sitter remains at bedside.

## 2011-09-10 NOTE — BH Assessment (Addendum)
Assessment Note   [name] is an 36 y.o. female. PT REPORTED TO THE ED BY EMS AFTER INTENTIONALLY INGESTING APPROXIMATELY 100  40MG  PROZAC PILLS WITH INTENT TO SLEEP AND NOT WAKE UP.  PT REPORTS BEING DEPRESSED DUE TO NOT BEING ABLE TO FIND A FULL TIME JOB AND THEN 2 DAYS AGO HER LIVE IN GIRLFRIEND LEFT HER.  SHE IS HOPELESS AND DISPLAYS VERY LOW SELF ESTEEM. PT REPORTED SHE HAD STOPPED DRINKING FOR A WHILE BUT FOR THE PAST 2 MONTHS SHE HAS BEEN DRINKING 4-5 BEERS DAILY.  SHE DENIES H/I AND IS NOT PSYCHOTIC.  POISON CONTROL WAS CALLED. PT IS RELEASED BY POISON CONTROL AND IS MEDICALLY  CLEARED.  PSYCH CONSULT RECOMMENDED INPATIENT TREATMENT. PT IS UNABLE TO CONTRACT FOR SAFETY.         Axis I: Major Depression, Recurrent severe Without Psychotic Features,  Alcohol Dependency Axis II: Deferred Axis III:  Past Medical History  Diagnosis Date  . COPD (chronic obstructive pulmonary disease)   . Asthma   . Depression   . Tobacco abuse   . Alcohol abuse   . Anemia 01/31/2011  . Hypoxia 02/01/2011   Axis IV: economic problems, occupational problems and problems with primary support group Axis V: 11-20 some danger of hurting self or others possible OR occasionally fails to maintain minimal personal hygiene OR gross impairment in communication  Past Medical History:  Past Medical History  Diagnosis Date  . COPD (chronic obstructive pulmonary disease)   . Asthma   . Depression   . Tobacco abuse   . Alcohol abuse   . Anemia 01/31/2011  . Hypoxia 02/01/2011    Past Surgical History  Procedure Date  . Back surgery   . Cesarean section   . Dilation and curettage of uterus     Family History:  Family History  Problem Relation Age of Onset  . Hypothyroidism Mother   . Coronary artery disease Mother     Social History:  reports that she has quit smoking. Her smoking use included Cigarettes. She smoked 1 pack per day. She does not have any smokeless tobacco history on  file. She reports that she does not drink alcohol or use illicit drugs.  Additional Social History:  Alcohol / Drug Use Pain Medications: na Prescriptions: na Over the Counter: na History of alcohol / drug use?: Yes Substance #1 Name of Substance 1: beer 1 - Age of First Use: 10 1 - Amount (size/oz): 4-5 beers 1 - Frequency: daily 1 - Duration: 2 mos 1 - Last Use / Amount: last night 6 pk  CIWA: CIWA-Ar BP: 95/57 mmHg Pulse Rate: 68  Nausea and Vomiting: mild nausea with no vomiting Tactile Disturbances: very mild itching, pins and needles, burning or numbness Tremor: not visible, but can be felt fingertip to fingertip Auditory Disturbances: mild harshness or ability to frighten Paroxysmal Sweats: barely perceptible sweating, palms moist Visual Disturbances: not present Anxiety: mildly anxious Headache, Fullness in Head: very mild Agitation: somewhat more than normal activity Orientation and Clouding of Sensorium: oriented and can do serial additions CIWA-Ar Total: 9  COWS:    Allergies: No Known Allergies  Home Medications:  (Not in a hospital admission)  OB/GYN Status:  No LMP recorded.  General Assessment Data Location of Assessment: [facility] ACT Assessment: Yes Living Arrangements: Alone (with 58 yr old son) Can pt return to current living arrangement?: Yes Admission Status: Voluntary Is patient capable of signing voluntary admission?: Yes Transfer from: Acute Hospital Referral  Source: MD (DR IVA KNAPP/ DR Donnetta Hutching)  Education Status Contact person: DIANE HALL-OTHER-[phone]  Risk to self Suicidal Ideation: Yes-Currently Present Suicidal Intent: Yes-Currently Present Is patient at risk for suicide?: Yes Suicidal Plan?: Yes-Currently Present Specify Current Suicidal Plan: OVERDOSED ON PROZAC Access to Means: Yes Specify Access to Suicidal Means: PT OVERDOSED ON APPROX. 100 40MG  PROZAC PILLS What has been your use of drugs/alcohol within the last 12  months?: BEER Previous Attempts/Gestures: No How many times?: 0  Other Self Harm Risks: NA Triggers for Past Attempts: None known Intentional Self Injurious Behavior: None Family Suicide History: No Recent stressful life event(s): Loss (Comment) (BREAK-UP WITH GIRLFRIEND) Persecutory voices/beliefs?: No Depression: Yes Depression Symptoms: Despondent;Tearfulness;Isolating;Fatigue;Guilt;Loss of interest in usual pleasures;Feeling worthless/self pity;Feeling angry/irritable Substance abuse history and/or treatment for substance abuse?: Yes Suicide prevention information given to non-admitted patients: Not applicable  Risk to Others Homicidal Ideation: No Thoughts of Harm to Others: No Current Homicidal Intent: No Current Homicidal Plan: No Access to Homicidal Means: No History of harm to others?: No Assessment of Violence: None Noted Violent Behavior Description: NA Does patient have access to weapons?: No Criminal Charges Pending?: No Does patient have a court date: No  Psychosis Hallucinations: None noted Delusions: None noted  Mental Status Report Appear/Hygiene: Improved Eye Contact: Good Motor Activity: Freedom of movement Speech: Logical/coherent;Pressured;Slow;Soft Level of Consciousness: Alert Mood: Depressed;Despair;Empty;Helpless;Irritable;Sad;Worthless, low self-esteem Affect: Appropriate to circumstance;Depressed;Sad Anxiety Level: Minimal Thought Processes: Coherent;Relevant Judgement: Impaired Orientation: Person;Place;Time;Situation Obsessive Compulsive Thoughts/Behaviors: None  Cognitive Functioning Concentration: Normal Memory: Recent Intact;Remote Intact IQ: Average Insight: Poor Impulse Control: Poor Appetite: Poor Sleep: Decreased Total Hours of Sleep: 4  Vegetative Symptoms: None  ADLScreening [facility] Assessment Services) Patient's cognitive ability adequate to safely complete daily activities?: Yes Patient able to express need for assistance  with ADLs?: Yes Independently performs ADLs?: Yes  Abuse/Neglect [facility]) Physical Abuse: Denies Verbal Abuse: Denies Sexual Abuse: Denies  Prior Inpatient Therapy Prior Inpatient Therapy: Yes Prior Therapy Dates: AGE 44 Prior Therapy Facilty/Provider(s): [facility] Reason for Treatment: DEPRESSION  Prior Outpatient Therapy Prior Outpatient Therapy: No Prior Therapy Dates: NA Prior Therapy Facilty/Provider(s): PCP PRESCRIBES MENTAL HEALTH MEDS Reason for Treatment: DEPRESSION  ADL Screening (condition at time of admission) Patient's cognitive ability adequate to safely complete daily activities?: Yes Patient able to express need for assistance with ADLs?: Yes Independently performs ADLs?: Yes Weakness of Legs: None Weakness of Arms/Hands: None  Home Assistive Devices/Equipment Home Assistive Devices/Equipment: None  Therapy Consults (therapy consults require a physician order) PT Evaluation Needed: No OT Evalulation Needed: No SLP Evaluation Needed: No Abuse/Neglect Assessment (Assessment to be complete while patient is alone) Physical Abuse: Denies Verbal Abuse: Denies Sexual Abuse: Denies Exploitation of patient/patient's resources: Denies Self-Neglect: Denies Values / Beliefs Cultural Requests During Hospitalization: None Spiritual Requests During Hospitalization: None Consults Spiritual Care Consult Needed: No Social Work Consult Needed: No Merchant navy officer (For Healthcare) Advance Directive: Patient does not have advance directive;Patient would not like information Pre-existing out of facility DNR order (yellow form or pink MOST form): No Nutrition Screen Diet: Regular  Additional Information 1:1 In Past 12 Months?: No CIRT Risk: No Elopement Risk: No Does patient have medical clearance?: Yes     Disposition:REFERRED TO [facility].  ACCEPTED BY [facility] TO [facility] ROOM 307-2     Disposition Disposition of Patient: Inpatient treatment program Type of  inpatient treatment program: Adult  On Site Evaluation by: DR IVA KNAPP  Reviewed with Physician:  DR Raoul Pitch Winford  09/10/2011 10:53 AM

## 2011-09-10 NOTE — ED Provider Notes (Signed)
History     CSN: [id number]  Arrival date & time 09/10/11  0203   First MD Initiated Contact with Patient 09/10/11 0310      Chief Complaint  Patient presents with  . Drug Overdose    (Consider location/radiation/quality/duration/timing/severity/associated sxs/prior treatment) HPI Pt told me she took a bottle of 40 pills of prozac and some more loose pills in a container that she estimates about 70-80 tablets at about 11 pm (she told the nurses she took them about 1 am). Pt states "I wanted to sleep". Patient relates her live-in girlfriend and she broke up the day before and they have been texting  and talking to each other all day and fighting. She states she did have nausea after she took the pills but not now. She denies vomiting.  She states she has had one psychiatric admission years ago when she was much younger for depression but states this is the first time she has done something to hurt herself. She denies feeling sick in any other way.   PCP Dr Olena Leatherwood  Past Medical History  Diagnosis Date  . COPD (chronic obstructive pulmonary disease)   . Asthma   . Depression   . Tobacco abuse   . Alcohol abuse   . Anemia 01/31/2011  . Hypoxia 02/01/2011    Past Surgical History  Procedure Date  . Back surgery   . Cesarean section   . Dilation and curettage of uterus     Family History  Problem Relation Age of Onset  . Hypothyroidism Mother   . Coronary artery disease Mother     History  Substance Use Topics  . Smoking status: Former Smoker -- 1.0 packs/day    Types: Cigarettes  . Smokeless tobacco: Not on file  . Alcohol Use: No     sober since november  lives alone  OB History    Grav Para Term Preterm Abortions TAB SAB Ect Mult Living                  Review of Systems  All other systems reviewed and are negative.    Allergies  Review of patient's allergies indicates no known allergies.  Home Medications   Current Outpatient Rx  Name Route Sig  Dispense Refill  . ALBUTEROL SULFATE HFA 108 (90 BASE) MCG/ACT IN AERS Inhalation Inhale 2 puffs into the lungs 4 (four) times daily. Shortness of breath 1 Inhaler 1  . ALBUTEROL SULFATE HFA 108 (90 BASE) MCG/ACT IN AERS Inhalation Inhale 2 puffs into the lungs every 6 (six) hours as needed for wheezing. 1 Inhaler 2  . ALBUTEROL SULFATE (2.5 MG/3ML) 0.083% IN NEBU Nebulization Take 3 mLs (2.5 mg total) by nebulization every 6 (six) hours as needed for wheezing or shortness of breath. For asthma 75 mL 1  . FLUOXETINE HCL 40 MG PO CAPS Oral Take 40 mg by mouth at bedtime.     . IPRATROPIUM BROMIDE 0.02 % IN SOLN Nebulization Take 500 mcg by nebulization 4 (four) times daily. Mixes with the albuterol solution    . MOMETASONE FURO-FORMOTEROL FUM 100-5 MCG/ACT IN AERO Inhalation Inhale 2 puffs into the lungs 2 (two) times daily. 1 Inhaler 1  . PREDNISONE 10 MG PO TABS  Take 6 tablets day one, 5 tablets day two, 4 tablets day three, 3 tablets day four, 2 tablets day five, then 1 tablet day six 21 tablet 0    BP 95/57  Pulse 68  Temp 97.5 F (36.4 C) (  Oral)  Resp 20  Ht 5\' 5"  (1.651 m)  Wt 170 lb (77.111 kg)  BMI 28.29 kg/m2  SpO2 94%  Vital signs normal except for hypotension   Physical Exam  Constitutional: She is oriented to person, place, and time. She appears well-developed and well-nourished.  Non-toxic appearance. She does not appear ill. No distress.  HENT:  Head: Normocephalic and atraumatic.  Right Ear: External ear normal.  Left Ear: External ear normal.  Nose: Nose normal. No mucosal edema or rhinorrhea.  Mouth/Throat: Oropharynx is clear and moist and mucous membranes are normal. No dental abscesses or uvula swelling.  Eyes: Conjunctivae and EOM are normal. Pupils are equal, round, and reactive to light.  Neck: Normal range of motion and full passive range of motion without pain. Neck supple.  Cardiovascular: Normal rate, regular rhythm and normal heart sounds.  Exam reveals no  gallop and no friction rub.   No murmur heard. Pulmonary/Chest: Effort normal and breath sounds normal. No respiratory distress. She has no wheezes. She has no rhonchi. She has no rales. She exhibits no tenderness and no crepitus.  Abdominal: Soft. Normal appearance and bowel sounds are normal. She exhibits no distension. There is no tenderness. There is no rebound and no guarding.  Musculoskeletal: Normal range of motion. She exhibits no edema and no tenderness.       Moves all extremities well.   Neurological: She is alert and oriented to person, place, and time. She has normal strength. No cranial nerve deficit.       Patient appears mildly sleepy however she is able to stay awake to hold conversation.  Skin: Skin is warm, dry and intact. No rash noted. No erythema. No pallor.  Psychiatric: She has a normal mood and affect. Her speech is normal and behavior is normal. Her mood appears not anxious.    ED Course  Procedures (including critical care time)   Person control states the peak action of the medication is at 4-6 hours which will be sometime between 3 and 5 AM depending on what time she took the medication.  07:30 patient  states she feels physically fine. Patient however refusing to a breakfast. She is awake and alert.  Patient's mild hypotension was treated with IV fluids.  07:40 Samson Frederic, ACT will see patient. Telepsych consult ordered.   Results for orders placed during the hospital encounter of 09/10/11  CBC WITH DIFFERENTIAL      Component Value Range   WBC 7.0  4.0 - 10.5 K/uL   RBC 4.36  3.87 - 5.11 MIL/uL   Hemoglobin 11.1 (*) 12.0 - 15.0 g/dL   HCT 16.1 (*) 09.6 - 04.5 %   MCV 79.6  78.0 - 100.0 fL   MCH 25.5 (*) 26.0 - 34.0 pg   MCHC 32.0  30.0 - 36.0 g/dL   RDW 40.9  81.1 - 91.4 %   Platelets 302  150 - 400 K/uL   Neutrophils Relative 43  43 - 77 %   Neutro Abs 3.0  1.7 - 7.7 K/uL   Lymphocytes Relative 45  12 - 46 %   Lymphs Abs 3.1  0.7 - 4.0 K/uL    Monocytes Relative 8  3 - 12 %   Monocytes Absolute 0.5  0.1 - 1.0 K/uL   Eosinophils Relative 4  0 - 5 %   Eosinophils Absolute 0.3  0.0 - 0.7 K/uL   Basophils Relative 0  0 - 1 %   Basophils Absolute 0.0  0.0 - 0.1 K/uL  BASIC METABOLIC PANEL      Component Value Range   Sodium 136  135 - 145 mEq/L   Potassium 3.7  3.5 - 5.1 mEq/L   Chloride 101  96 - 112 mEq/L   CO2 24  19 - 32 mEq/L   Glucose, Bld 79  70 - 99 mg/dL   BUN 9  6 - 23 mg/dL   Creatinine, Ser 0.98  0.50 - 1.10 mg/dL   Calcium 9.7  8.4 - 11.9 mg/dL   GFR calc non Af Amer >90  >90 mL/min   GFR calc Af Amer >90  >90 mL/min  URINALYSIS, ROUTINE W REFLEX MICROSCOPIC      Component Value Range   Color, Urine YELLOW  YELLOW   APPearance CLEAR  CLEAR   Specific Gravity, Urine <1.005 (*) 1.005 - 1.030   pH 5.5  5.0 - 8.0   Glucose, UA NEGATIVE  NEGATIVE mg/dL   Hgb urine dipstick TRACE (*) NEGATIVE   Bilirubin Urine NEGATIVE  NEGATIVE   Ketones, ur NEGATIVE  NEGATIVE mg/dL   Protein, ur NEGATIVE  NEGATIVE mg/dL   Urobilinogen, UA 0.2  0.0 - 1.0 mg/dL   Nitrite NEGATIVE  NEGATIVE   Leukocytes, UA NEGATIVE  NEGATIVE  PREGNANCY, URINE      Component Value Range   Preg Test, Ur NEGATIVE  NEGATIVE  URINE RAPID DRUG SCREEN (HOSP PERFORMED)      Component Value Range   Opiates NONE DETECTED  NONE DETECTED   Cocaine NONE DETECTED  NONE DETECTED   Benzodiazepines NONE DETECTED  NONE DETECTED   Amphetamines NONE DETECTED  NONE DETECTED   Tetrahydrocannabinol NONE DETECTED  NONE DETECTED   Barbiturates NONE DETECTED  NONE DETECTED  SALICYLATE LEVEL      Component Value Range   Salicylate Lvl <2.0 (*) 2.8 - 20.0 mg/dL  ACETAMINOPHEN LEVEL      Component Value Range   Acetaminophen (Tylenol), Serum <15.0  10 - 30 ug/mL  ETHANOL      Component Value Range   Alcohol, Ethyl (B) 83 (*) 0 - 11 mg/dL  URINE MICROSCOPIC-ADD ON      Component Value Range   Squamous Epithelial / LPF FEW (*) RARE   WBC, UA 0-2  <3 WBC/hpf    RBC / HPF 0-2  <3 RBC/hpf   Bacteria, UA FEW (*) RARE   Urine-Other CLUE CELLS PRESENT     Laboratory interpretation all normal except bacterial vaginosis, mild anemia   1. BV (bacterial vaginosis)   2. Drug overdose   3. Depression   4. Suicidal ideation    Plan admission per ACT  Devoria Albe, MD, FACEP    MDM          Ward Givens, MD 09/10/11 [phone]

## 2011-09-10 NOTE — ED Notes (Signed)
Pt remains on cardiac monitor w/ NIBP. Sitter at bedside. NAD noted

## 2011-09-10 NOTE — ED Notes (Signed)
Pt resting in bed, sitter at bedside. Pt voices no complaints.

## 2011-09-10 NOTE — ED Notes (Signed)
Pt ambulated to restroom escorted via sitter. Returned to room & placed back on cardiac monitor. NAD noted.

## 2011-09-10 NOTE — ED Notes (Signed)
Pt states she took 120 prozac tablets about 1 hours ago. When asked if she was trying to hurt herself just states she did something stupid.

## 2011-09-10 NOTE — ED Notes (Signed)
Patient is resting at this time.

## 2011-09-10 NOTE — ED Notes (Signed)
Pt apparently took unknown amt of prozac per family members.

## 2011-09-10 NOTE — Progress Notes (Signed)
Psychoeducational Group Note  Date:  09/10/2011 Time:  2000  Group Topic/Focus:  Wrap-Up Group:   The focus of this group is to help patients review their daily goal of treatment and discuss progress on daily workbooks.  Participation Level: Did Not Attend  Participation Quality:  Not Applicable  Affect:  Not Applicable  Cognitive:  Not Applicable  Insight:  Not Applicable  Engagement in Group: Not Applicable  Additional Comments:  The pt. Did not feel well enough to attend group.   Hazle Coca S 09/10/2011, 11:59 PM

## 2011-09-10 NOTE — Tx Team (Signed)
Initial Interdisciplinary Treatment Plan  PATIENT STRENGTHS: (choose at least two) Average or above average intelligence Capable of independent living General fund of knowledge  PATIENT STRESSORS: Financial difficulties Marital or family conflict Substance abuse   PROBLEM LIST: Problem List/Patient Goals Date to be addressed Date deferred Reason deferred Estimated date of resolution  Grief issues related to loss of significant relationship 09/10/2011           Substance abuse issues-nightly drinking 09/11/2011           COPD/Asthma stabilization 09/10/11                              DISCHARGE CRITERIA:  Ability to meet basic life and health needs Improved stabilization in mood, thinking, and/or behavior Motivation to continue treatment in a less acute level of care  PRELIMINARY DISCHARGE PLAN: Attend aftercare/continuing care group Return to previous living arrangement Return to previous work or school arrangements  PATIENT/FAMIILY INVOLVEMENT: This treatment plan has been presented to and reviewed with the patient, [name], and/or family member, .  The patient and family have been given the opportunity to ask questions and make suggestions.  Cresenciano Lick 09/10/2011, 3:58 PM

## 2011-09-10 NOTE — ED Notes (Signed)
Report called to [facility]--[facility] awaiting arrival.  Meal tray given to pt.  Sitter remains at bedside.

## 2011-09-10 NOTE — Discharge Planning (Addendum)
In addition to the other stressors listed in the admit note: Pt's depression increased in reference to the 1 yr anniversary of the loss of her cousin. Pt also wants emphasis that her reduced work hours at the job is more heavy of a stressor than the breakup with her girlfriend.

## 2011-09-10 NOTE — ED Notes (Signed)
Pt up to restroom w/ sitter escort. Pt returned to room placed back on cardiac monitor. Sitter remains in room w/ pt.

## 2011-09-11 DIAGNOSIS — F1994 Other psychoactive substance use, unspecified with psychoactive substance-induced mood disorder: Secondary | ICD-10-CM

## 2011-09-11 DIAGNOSIS — F17200 Nicotine dependence, unspecified, uncomplicated: Secondary | ICD-10-CM

## 2011-09-11 DIAGNOSIS — F1910 Other psychoactive substance abuse, uncomplicated: Secondary | ICD-10-CM

## 2011-09-11 DIAGNOSIS — F332 Major depressive disorder, recurrent severe without psychotic features: Principal | ICD-10-CM

## 2011-09-11 MED ORDER — IBUPROFEN 800 MG PO TABS: 800.0000 mg | ORAL_TABLET | Freq: Four times a day (QID) | ORAL | Status: DC | PRN

## 2011-09-11 MED ORDER — AMITRIPTYLINE HCL 25 MG PO TABS
25.0000 mg | ORAL_TABLET | Freq: Every day | ORAL | Status: DC
  Administered 2011-09-11 – 2011-09-12 (×2): 25 mg via ORAL
  Filled 2011-09-11: qty 1
  Filled 2011-09-11: qty 3
  Filled 2011-09-11 (×3): qty 1

## 2011-09-11 MED ORDER — FERROUS SULFATE 325 (65 FE) MG PO TABS
325.0000 mg | ORAL_TABLET | Freq: Every day | ORAL | Status: DC
  Administered 2011-09-12 – 2011-09-13 (×2): 325 mg via ORAL
  Filled 2011-09-11: qty 1
  Filled 2011-09-11: qty 3
  Filled 2011-09-11 (×2): qty 1

## 2011-09-11 MED ORDER — FLUOXETINE HCL 20 MG PO CAPS
40.0000 mg | ORAL_CAPSULE | Freq: Every day | ORAL | Status: DC
  Administered 2011-09-11 – 2011-09-12 (×2): 40 mg via ORAL
  Filled 2011-09-11 (×2): qty 2
  Filled 2011-09-11: qty 6
  Filled 2011-09-11 (×2): qty 2

## 2011-09-11 NOTE — Progress Notes (Signed)
[facility] MD Progress Note  09/11/2011 4:30 PM  Diagnosis:   Axis I: Major Depression, Recurrent severe, Substance Abuse, Substance Induced Mood Disorder and Nicotine Dependence Axis II: Deferred Axis III:  Past Medical History  Diagnosis Date  . COPD (chronic obstructive pulmonary disease)   . Asthma   . Depression   . Tobacco abuse   . Alcohol abuse   . Anemia 01/31/2011  . Hypoxia 02/01/2011   Axis IV: other psychosocial or environmental problems and problems with access to health care services Axis V: 41-50 serious symptoms  ADL's:  Intact  Sleep: Fair  Appetite:  Good  Suicidal Ideation:  Pt denies any thoughts, plans, intent of suicide Homicidal Ideation:  Pt denies any thoughts, plans, intent of homicide  Mental Status Examination/Evaluation: Objective:  Appearance: Casual  Eye Contact::  Good  Speech:  Clear and Coherent  Volume:  Normal  Mood:  Anxious, Depressed, Hopeless, Irritable and Worthless  Affect:  Congruent  Thought Process:  Coherent, Intact and Logical  Orientation:  Full  Thought Content:  WDL  Suicidal Thoughts:  Yes.  without intent/plan  Homicidal Thoughts:  No  Memory:  Immediate;   Fair Recent;   Fair Remote;   Fair  Judgement:  Impaired  Insight:  Lacking  Psychomotor Activity:  Normal  Concentration:  Fair  Recall:  Fair  Akathisia:  No  Handed:  Right  AIMS (if indicated):     Assets:  Communication Skills Desire for Improvement  Sleep:  Number of Hours: 6.5    Vital Signs:Blood pressure 90/61, pulse 88, temperature 98.3 F (36.8 C), temperature source Oral, resp. rate 18, height 5\' 4"  (1.626 m), weight 86.183 kg (190 lb), last menstrual period 08/28/2011. Current Medications: Current Facility-Administered Medications  Medication Dose Route Frequency Provider Last Rate Last Dose  . acetaminophen (TYLENOL) tablet 650 mg  650 mg Oral Q6H PRN Sanjuana Kava, NP      . albuterol (PROVENTIL HFA;VENTOLIN HFA) 108 (90 BASE) MCG/ACT  inhaler 2 puff  2 puff Inhalation Q6H PRN Sanjuana Kava, NP   2 puff at 09/11/11 [phone]  . alum & mag hydroxide-simeth (MAALOX/MYLANTA) 200-200-20 MG/5ML suspension 30 mL  30 mL Oral Q4H PRN Sanjuana Kava, NP      . ferrous sulfate tablet 325 mg  325 mg Oral Q breakfast Sanjuana Kava, NP      . FLUoxetine (PROZAC) capsule 40 mg  40 mg Oral QHS Mike Craze, MD      . ibuprofen (ADVIL,MOTRIN) tablet 800 mg  800 mg Oral Q6H PRN Mike Craze, MD      . ipratropium (ATROVENT) nebulizer solution 500 mcg  500 mcg Nebulization QID Sanjuana Kava, NP   500 mcg at 09/11/11 9604  . magnesium hydroxide (MILK OF MAGNESIA) suspension 30 mL  30 mL Oral Daily PRN Sanjuana Kava, NP      . mometasone-formoterol (DULERA) inhaler 2 puff  2 puff Inhalation BID Sanjuana Kava, NP   2 puff at 09/11/11 0828  . nicotine (NICODERM CQ - dosed in mg/24 hours) patch 21 mg  21 mg Transdermal Q0600 Sanjuana Kava, NP   21 mg at 09/11/11 0650  . ondansetron (ZOFRAN) tablet 4 mg  4 mg Oral QID PRN Sanjuana Kava, NP      . traZODone (DESYREL) tablet 100 mg  100 mg Oral QHS Sanjuana Kava, NP   100 mg at 09/10/11 2153    Lab Results:  Results for orders placed during the hospital encounter of 09/10/11 (from the past 48 hour(s))  URINALYSIS, ROUTINE W REFLEX MICROSCOPIC     Status: Abnormal   Collection Time   09/10/11  2:50 AM      Component Value Range Comment   Color, Urine YELLOW  YELLOW    APPearance CLEAR  CLEAR    Specific Gravity, Urine <1.005 (*) 1.005 - 1.030    pH 5.5  5.0 - 8.0    Glucose, UA NEGATIVE  NEGATIVE mg/dL    Hgb urine dipstick TRACE (*) NEGATIVE    Bilirubin Urine NEGATIVE  NEGATIVE    Ketones, ur NEGATIVE  NEGATIVE mg/dL    Protein, ur NEGATIVE  NEGATIVE mg/dL    Urobilinogen, UA 0.2  0.0 - 1.0 mg/dL    Nitrite NEGATIVE  NEGATIVE    Leukocytes, UA NEGATIVE  NEGATIVE   PREGNANCY, URINE     Status: Normal   Collection Time   09/10/11  2:50 AM      Component Value Range Comment   Preg Test, Ur  NEGATIVE  NEGATIVE   URINE RAPID DRUG SCREEN (HOSP PERFORMED)     Status: Normal   Collection Time   09/10/11  2:50 AM      Component Value Range Comment   Opiates NONE DETECTED  NONE DETECTED    Cocaine NONE DETECTED  NONE DETECTED    Benzodiazepines NONE DETECTED  NONE DETECTED    Amphetamines NONE DETECTED  NONE DETECTED    Tetrahydrocannabinol NONE DETECTED  NONE DETECTED    Barbiturates NONE DETECTED  NONE DETECTED   URINE MICROSCOPIC-ADD ON     Status: Abnormal   Collection Time   09/10/11  2:50 AM      Component Value Range Comment   Squamous Epithelial / LPF FEW (*) RARE    WBC, UA 0-2  <3 WBC/hpf    RBC / HPF 0-2  <3 RBC/hpf    Bacteria, UA FEW (*) RARE    Urine-Other CLUE CELLS PRESENT     CBC WITH DIFFERENTIAL     Status: Abnormal   Collection Time   09/10/11  2:55 AM      Component Value Range Comment   WBC 7.0  4.0 - 10.5 K/uL    RBC 4.36  3.87 - 5.11 MIL/uL    Hemoglobin 11.1 (*) 12.0 - 15.0 g/dL    HCT 16.1 (*) 09.6 - 46.0 %    MCV 79.6  78.0 - 100.0 fL    MCH 25.5 (*) 26.0 - 34.0 pg    MCHC 32.0  30.0 - 36.0 g/dL    RDW 04.5  40.9 - 81.1 %    Platelets 302  150 - 400 K/uL    Neutrophils Relative 43  43 - 77 %    Neutro Abs 3.0  1.7 - 7.7 K/uL    Lymphocytes Relative 45  12 - 46 %    Lymphs Abs 3.1  0.7 - 4.0 K/uL    Monocytes Relative 8  3 - 12 %    Monocytes Absolute 0.5  0.1 - 1.0 K/uL    Eosinophils Relative 4  0 - 5 %    Eosinophils Absolute 0.3  0.0 - 0.7 K/uL    Basophils Relative 0  0 - 1 %    Basophils Absolute 0.0  0.0 - 0.1 K/uL   BASIC METABOLIC PANEL     Status: Normal   Collection Time   09/10/11  2:55 AM  Component Value Range Comment   Sodium 136  135 - 145 mEq/L    Potassium 3.7  3.5 - 5.1 mEq/L    Chloride 101  96 - 112 mEq/L    CO2 24  19 - 32 mEq/L    Glucose, Bld 79  70 - 99 mg/dL    BUN 9  6 - 23 mg/dL    Creatinine, Ser 1.61  0.50 - 1.10 mg/dL    Calcium 9.7  8.4 - 09.6 mg/dL    GFR calc non Af Amer >90  >90 mL/min    GFR  calc Af Amer >90  >90 mL/min   SALICYLATE LEVEL     Status: Abnormal   Collection Time   09/10/11  2:55 AM      Component Value Range Comment   Salicylate Lvl <2.0 (*) 2.8 - 20.0 mg/dL   ACETAMINOPHEN LEVEL     Status: Normal   Collection Time   09/10/11  2:55 AM      Component Value Range Comment   Acetaminophen (Tylenol), Serum <15.0  10 - 30 ug/mL   ETHANOL     Status: Abnormal   Collection Time   09/10/11  2:55 AM      Component Value Range Comment   Alcohol, Ethyl (B) 83 (*) 0 - 11 mg/dL     Physical Findings: AIMS: Facial and Oral Movements Muscles of Facial Expression: None, normal Lips and Perioral Area: None, normal Jaw: None, normal Tongue: None, normal,Extremity Movements Upper (arms, wrists, hands, fingers): None, normal Lower (legs, knees, ankles, toes): None, normal, Trunk Movements Neck, shoulders, hips: None, normal, Overall Severity Severity of abnormal movements (highest score from questions above): None, normal Incapacitation due to abnormal movements: None, normal Patient's awareness of abnormal movements (rate only patient's report): No Awareness, Dental Status Current problems with teeth and/or dentures?: No Does patient usually wear dentures?: No  CIWA:    COWS:     Treatment Plan Summary: Daily contact with patient to assess and evaluate symptoms and progress in treatment Medication management Mood/anxiety less than 3/10 where the scale is 1 is the best and 10 is the worst No suicidal or homicidal thoughts for at least 48 hours.  Plan: Admit, restart Prozac, start Trazodone for insomnia, Iron for anemia, use Motrin for back pain, and stop opiates.  Discussed the risks, benefits, and probable clinical course with and without treatment.  Pt is agreeable to the current course of treatment.  Madine Sarr 09/11/2011, 4:30 PM
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
## 2011-09-11 NOTE — Tx Team (Addendum)
Interdisciplinary Treatment Plan Update (Adult)  Date:  09/11/2011  Time Reviewed:  10:46 AM   Progress in Treatment: Attending groups: Yes Participating in groups:  Yes Taking medication as prescribed:  Yes Tolerating medication: Yes Family/Significant othe contact made:  Requesting consent to contact support Patient understands diagnosis: Yes Discussing patient identified problems/goals with staff:  Yes Medical problems stabilized or resolved: Yes Denies suicidal/homicidal ideation: Yes Issues/concerns per patient self-inventory:  No  Other:  New problem(s) identified: None  Reason for Continuation of Hospitalization:  Depression Anxiety Medication stabilization  Interventions implemented related to continuation of hospitalization:  Medication stabilization, safety checks q 15 mins, group attendance  Additional comments: [name] would not like a referral to VR services as she has a new job now  Estimated length of stay:  3-5 days  Discharge Plan: Discharge home and follow up with [facility] in [facility] goal(s):  Review of initial/current patient goals per problem list:   1.  Goal(s): Decrease depressive symptoms to 4 or less  Met:  No  Target date: by discharge  As evidenced by: [name] rates depressive at 5 today  2.  Goal (s): Decrease anxiety symptoms to 4 or less  Met:  No  Target date: by discharge  As evidenced by: [name] rates anxiety at 5  3.  Goal(s): Reduce potential for self-harm/suicide  Met:  Yes  Target date: by discharge  As evidenced by: [name] denies suicidality today  4.  Goal(s): Address substance abuse   Met:  No  Target date: by discharge  As evidenced by: Attend 12 step meetings on 300 hall nightly  5.  Goal(s): Address relationship issues and lack of work   Met:  DEFERRED  Target date: DEFFERRED  As evidenced by: Work and relationship issues not addressed at [facility]; referral made for outpatient  counseling and referral to [facility]      Attendees: Patient:     Family:     Physician:  Dr Orson Aloe, MD 09/11/2011 10:46 AM  Nursing:   Roswell Miners, RN 09/11/2011 10:46 AM  Case Manager:  Estevan Ryder, LCSWA 09/11/2011 10:46 AM  Counselor:  Angus Palms, LCSW 09/11/2011 10:46 AM  Other:  Neill Loft, RN 09/11/2011 10:46 AM  Other:  Corinne Ports, clinical psyh intern 09/11/2011 10:46 AM  Other:     Other:      Scribe for Treatment Team:   Billie Lade, 09/11/2011 10:46 AM

## 2011-09-11 NOTE — Discharge Planning (Signed)
Pt was seen this morning for discharge planning group.  Pt rates her depression level at a five and her anxiety level at a five also.  Pt admitted to the hospital due to an attempted suicide by medication overdosed.  Pt stated that she was stressed out due to financial concerns because she has been out of work for the last year, yet recently became employed again.  Pt has not received outpatient services for the last eight years when at that time she was seeing a therapist.  Upon discharge pt will return home where she lives with her son.  Pt does have access to transportation and she can afford medication.  CM will provide pt with follow up appt at [facility] in [place], [place].  Pt denies SI/HI/AVH.

## 2011-09-11 NOTE — H&P (Signed)
Medical/psychiatric screening examination/treatment/procedure(s) were performed by non-physician practitioner and as supervising physician I was immediately available for consultation/collaboration.  I have seen and examined this patient and agree the major elements of this evaluation.  
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
## 2011-09-11 NOTE — Progress Notes (Signed)
Date: 09/11/2011        Time: 1145       Group Topic/Focus: Patient invited to participate in animal assisted therapy. Pets as a coping skill and responsibility were discussed.  Participation Level: Active  Participation Quality: Appropriate and Attentive  Affect: Blunted  Cognitive: Appropriate and Oriented   Additional Comments: None.  Dennie Vecchio 09/11/2011 12:16 PM

## 2011-09-11 NOTE — Progress Notes (Signed)
7a-7p-D-Patient active on the unit this evening. Sleeping fair but eating well. Rates depression at 6 and feeling hopeless at 1. Denies SI. Patient easily engages in conversation with Clinical research associate.  A-Patient's prozac was reordered to start at hs. Also has order for elavil to start tonight. Patient reports having a good talk with MD today. She reports having an insight regarding regularly taking her antidepressant. Patient stated "I didn't really realize it was helping when I had stopped taking it. Now I see how important it is." Given praise for realizing this and learning from her experience before admission. R-Patient remains safe on every fifteen minute checks. Encouraged to seek out Clinical research associate as needed.

## 2011-09-11 NOTE — Progress Notes (Signed)
Patient has been in her room resting. Patient was in formed of her scheduled medications and is agreeable to taking them. Patient did not attend group and was allowed to rest. Patient appears sad and depressed.  Lezlie Lye RN completed admission process. Safety maintained on unit, will continue to monitor.

## 2011-09-12 DIAGNOSIS — F339 Major depressive disorder, recurrent, unspecified: Secondary | ICD-10-CM

## 2011-09-12 NOTE — Progress Notes (Signed)
[facility] Group Notes: (Counselor/Nursing/MHT/Case Management/Adjunct) 09/12/2011   @  11:00am  Finding Balance in Life  Type of Therapy:  Group Therapy  Participation Level:  Active  Participation Quality:  Appropriate, Sharing  Affect:  Appropriate  Cognitive:  Appropriate  Insight:  Good  Engagement in Group: Good   Engagement in Therapy:  Good  Modes of Intervention:  Support and Exploration  Summary of Progress/Problems: [name] explored areas in her life in which she lacks balance. She shared that she often does too much for her son in order to make up for times in the past that she was not there for him. She also stated that she is pushing herself to work extra hours each week to make up for being out of work for a year. [name] seemed to agree with counselor's statement that she is working hard to make up for lost time, but is losing herself in the process. She processed inability to take care of herself and guilt about what she "owes" to others. Specifically talked about saying "no" to her mother, and mother making statements like "After all I've done for you..." [name] explored ways to change her thought processes and entertain more balanced thoughts.   Billie Lade 09/12/2011   2:44 PM

## 2011-09-12 NOTE — Discharge Planning (Signed)
Pt was seen this morning during discharge planning group.  She stated that she is feeling good.  Pt rated her anxiety level at a zero and her depression level at a one.  Pt does have transportation and she feel like she is ready for discharge.  Pt denies SI/HI/AVH.

## 2011-09-12 NOTE — Progress Notes (Signed)
[facility] MD Progress Note  09/12/2011 1:14 PM  S: "My mood is better, and I am sleeping well.  I feel so much better emotionally. The group sessions are wonderful. I am learning a lot"  Diagnosis:   Axis I: Polysubstance abuse, Major depression, recurrent episode. Axis II: Deferred Axis III:  Past Medical History  Diagnosis Date  . COPD (chronic obstructive pulmonary disease)   . Asthma   . Depression   . Tobacco abuse   . Alcohol abuse   . Anemia 01/31/2011  . Hypoxia 02/01/2011   Axis IV: economic problems, occupational problems and other psychosocial or environmental problems Axis V: 51-60 moderate symptoms  ADL's:  Intact  Sleep: Good  Appetite:  Good  Suicidal Ideation:  Plan:  No Intent:  no Means:  no Homicidal Ideation:  Plan:  No Intent:  no Means:  No  AEB (as evidenced by): Per patient's report  Mental Status Examination/Evaluation: Objective:  Appearance: Casual  Eye Contact::  Good  Speech:  Clear and Coherent  Volume:  Normal  Mood:  Euthymic  Affect:  Appropriate  Thought Process:  Coherent  Orientation:  Full  Thought Content:  Rumination  Suicidal Thoughts:  No  Homicidal Thoughts:  No  Memory:  Immediate;   Good Recent;   Good Remote;   Good  Judgement:  Fair  Insight:  Fair  Psychomotor Activity:  Normal  Concentration:  Good  Recall:  Good  Akathisia:  No  Handed:  Right  AIMS (if indicated):     Assets:  Desire for Improvement  Sleep:  Number of Hours: 6.75    Vital Signs:Blood pressure 90/61, pulse 88, temperature 98.3 F (36.8 C), temperature source Oral, resp. rate 18, height 5\' 4"  (1.626 m), weight 86.183 kg (190 lb), last menstrual period 08/28/2011. Current Medications: Current Facility-Administered Medications  Medication Dose Route Frequency Provider Last Rate Last Dose  . acetaminophen (TYLENOL) tablet 650 mg  650 mg Oral Q6H PRN Sanjuana Kava, NP      . albuterol (PROVENTIL HFA;VENTOLIN HFA) 108 (90 BASE) MCG/ACT  inhaler 2 puff  2 puff Inhalation Q6H PRN Sanjuana Kava, NP   2 puff at 09/11/11 [phone]  . alum & mag hydroxide-simeth (MAALOX/MYLANTA) 200-200-20 MG/5ML suspension 30 mL  30 mL Oral Q4H PRN Sanjuana Kava, NP      . amitriptyline (ELAVIL) tablet 25 mg  25 mg Oral QHS Mike Craze, MD   25 mg at 09/11/11 2151  . ferrous sulfate tablet 325 mg  325 mg Oral Q breakfast Sanjuana Kava, NP   325 mg at 09/12/11 0809  . FLUoxetine (PROZAC) capsule 40 mg  40 mg Oral QHS Mike Craze, MD   40 mg at 09/11/11 2151  . ibuprofen (ADVIL,MOTRIN) tablet 800 mg  800 mg Oral Q6H PRN Mike Craze, MD      . ipratropium (ATROVENT) nebulizer solution 500 mcg  500 mcg Nebulization QID Sanjuana Kava, NP   500 mcg at 09/11/11 2154  . magnesium hydroxide (MILK OF MAGNESIA) suspension 30 mL  30 mL Oral Daily PRN Sanjuana Kava, NP      . mometasone-formoterol (DULERA) inhaler 2 puff  2 puff Inhalation BID Sanjuana Kava, NP   2 puff at 09/12/11 0809  . nicotine (NICODERM CQ - dosed in mg/24 hours) patch 21 mg  21 mg Transdermal Q0600 Sanjuana Kava, NP   21 mg at 09/12/11 1191  . ondansetron (ZOFRAN) tablet 4  mg  4 mg Oral QID PRN Sanjuana Kava, NP      . traZODone (DESYREL) tablet 100 mg  100 mg Oral QHS Sanjuana Kava, NP   100 mg at 09/11/11 2151    Lab Results: No results found for this or any previous visit (from the past 48 hour(s)).  Physical Findings: AIMS: Facial and Oral Movements Muscles of Facial Expression: None, normal Lips and Perioral Area: None, normal Jaw: None, normal Tongue: None, normal,Extremity Movements Upper (arms, wrists, hands, fingers): None, normal Lower (legs, knees, ankles, toes): None, normal, Trunk Movements Neck, shoulders, hips: None, normal, Overall Severity Severity of abnormal movements (highest score from questions above): None, normal Incapacitation due to abnormal movements: None, normal Patient's awareness of abnormal movements (rate only patient's report): No Awareness,  Dental Status Current problems with teeth and/or dentures?: No Does patient usually wear dentures?: No  CIWA:    COWS:     Treatment Plan Summary: Daily contact with patient to assess and evaluate symptoms and progress in treatment Medication management  Plan: Continue current treatment plan.  Armandina Stammer I 09/12/2011, 1:14 PM

## 2011-09-12 NOTE — Progress Notes (Signed)
Chaplain's Note / Grief Loss Group  The group focused primarily on loss as related to relationships. The group members talked about grief over deaths of key family members who had been sources of support and affirmation, and when multiple family members died close in time how they felt the loss of family as they had known it. There was good support of each other though not a lot of verbal interaction.   Pt appeared sad. She shared her own experience of loss her cousin who had lived next door and had been one of the few family members she could trust. She misses his presence and seeing his children who were "in and out of her house all the time."  [name] [phone]

## 2011-09-12 NOTE — Progress Notes (Signed)
Patient ID: [name], female   DOB: 1975/05/10, 36 y.o.   MRN: [id number]  Pt asleep; no s/s of distress noted at this time.

## 2011-09-12 NOTE — Progress Notes (Signed)
Psychoeducational Group Note  Date:  09/12/2011 Time:  1100  Group Topic/Focus:  Recovery Goals:   The focus of this group is to identify appropriate goals for recovery and establish a plan to achieve them.  Participation Level:  Active  Participation Quality:  Appropriate, Attentive, Sharing and Supportive  Affect:  Appropriate  Cognitive:  Alert and Appropriate  Insight:  Good  Engagement in Group:  Good  Additional Comments:  Pt was very attentive and sharing while attending group. Pt stated that simply saying "NO" is what is standing in between her and recovery.   Sharyn Lull 09/12/2011, 12:05 PM

## 2011-09-12 NOTE — Progress Notes (Signed)
[facility] Group Notes:  (Counselor/Nursing/MHT/Case Management/Adjunct)  09/12/2011 11:23 PM  Type of Therapy:  Psychoeducational Skills  Participation Level:  Active  Participation Quality:  Appropriate  Affect:  Appropriate  Cognitive:  Appropriate  Insight:  Good  Engagement in Group:  Good  Engagement in Therapy:  Good  Modes of Intervention:  Problem-solving  Summary of Progress/Problems: Pt attended AA meeting   Kymoni Monday L 09/12/2011, 11:23 PM

## 2011-09-12 NOTE — Progress Notes (Signed)
D:  Patient up and actively participating in groups today.  Does tend to isolate to her room when groups are not going on.  Tolerating medications well.  Rates depression at a 2 out of 10 today and denies suicidal thoughts.  Her appetite is good as is her sleep.   A:  Encouraged patient to continue to go to groups and to work on her workbook.  Encouraged her to come to staff with questions or concerns.   R:  Patient receptive.  Interacts well with staff and peers.  States she has gained knowledge from attending groups.

## 2011-09-13 ENCOUNTER — Encounter (HOSPITAL_COMMUNITY): Payer: Self-pay | Admitting: Psychiatry

## 2011-09-13 DIAGNOSIS — Z0189 Encounter for other specified special examinations: Secondary | ICD-10-CM

## 2011-09-13 HISTORY — DX: Encounter for other specified special examinations: Z01.89

## 2011-09-13 MED ORDER — AMITRIPTYLINE HCL 25 MG PO TABS: 25.0000 mg | ORAL_TABLET | Freq: Every day | ORAL | Status: DC

## 2011-09-13 MED ORDER — FLUOXETINE HCL 40 MG PO CAPS: 40.0000 mg | ORAL_CAPSULE | Freq: Every day | ORAL | Status: DC

## 2011-09-13 MED ORDER — FERROUS SULFATE 325 (65 FE) MG PO TABS: 325.0000 mg | ORAL_TABLET | Freq: Every day | ORAL | Status: DC

## 2011-09-13 MED ORDER — TRAZODONE HCL 100 MG PO TABS: 100.0000 mg | ORAL_TABLET | Freq: Every day | ORAL | Status: DC

## 2011-09-13 MED ORDER — NICOTINE 21 MG/24HR TD PT24: 1.0000 | MEDICATED_PATCH | Freq: Every day | TRANSDERMAL | Status: AC

## 2011-09-13 MED ORDER — MOMETASONE FURO-FORMOTEROL FUM 100-5 MCG/ACT IN AERO: 2.0000 | INHALATION_SPRAY | Freq: Two times a day (BID) | RESPIRATORY_TRACT | Status: DC

## 2011-09-13 NOTE — Progress Notes (Signed)
[facility] Adult Inpatient Family/Significant Other Suicide Prevention Education  Suicide Prevention Education:  Education Completed;[employer], girlfriend (face to face), has been identified by the patient as the family member/significant other with whom the patient will be residing, and identified as the person(s) who will aid the patient in the event of a mental health crisis (suicidal ideations/suicide attempt).  With written consent from the patient, the family member/significant other has been provided the following suicide prevention education, prior to the and/or following the discharge of the patient.  The suicide prevention education provided includes the following:  Suicide risk factors  Suicide prevention and interventions  National Suicide Hotline telephone number  [facility] assessment telephone number  [facility] Emergency Assistance 911  [facility] and/or Residential Mobile Crisis Unit telephone number  Request made of family/significant other to:  Remove weapons (e.g., guns, rifles, knives), all items previously/currently identified as safety concern.    Remove drugs/medications (over-the-counter, prescriptions, illicit drugs), all items previously/currently identified as a safety concern.  Patsy reported that there are no firearms in the home or that Dariona has access to. She stated that she has no safety concerns, but is worried that Shanina will not continue to work on the goals she has set. Patsy stated that she wants Makinley to focus on her recovery and on getting her GED instead of worrying so much about bills and what people think. She verbalized understanding of suicide prevention information, and stated that although she no longer lives in the home she will be staying there sometimes and plans to monitor [place] that way.  Billie Lade 09/13/2011, 4:17 PM

## 2011-09-13 NOTE — Progress Notes (Signed)
[facility] MD Progress Note  09/13/2011 2:22 PM  Diagnosis:   Axis I: Major Depression, Recurrent severe, Substance Abuse, Substance Induced Mood Disorder and Nicotine Dependence Axis II: Deferred Axis III:  Past Medical History  Diagnosis Date  . COPD (chronic obstructive pulmonary disease)   . Asthma   . Depression   . Tobacco abuse   . Alcohol abuse   . Anemia 01/31/2011  . Hypoxia 02/01/2011  . Needs sleep apnea assessment 2013.07.31   Axis IV: other psychosocial or environmental problems and problems with access to health care services Axis V: 41-50 serious symptoms  ADL's:  Intact  Sleep: Fair, roommate notes that she snores very loudly and stops breathing  Appetite:  Good  Suicidal Ideation:  Pt denies any thoughts, plans, intent of suicide Homicidal Ideation:  Pt denies any thoughts, plans, intent of homicide  Mental Status Examination/Evaluation: Objective:  Appearance: Casual  Eye Contact::  Good  Speech:  Clear and Coherent  Volume:  Normal  Mood:  Euthymic  Affect:  Congruent  Thought Process:  Coherent, Intact and Logical  Orientation:  Full  Thought Content:  WDL  Suicidal Thoughts:  No  Homicidal Thoughts:  No  Memory:  Immediate;   Good Recent;   Good Remote;   Good  Judgement:  Fair  Insight:  Fair  Psychomotor Activity:  Normal  Concentration:  Good  Recall:  Good  Akathisia:  No  Handed:  Right  AIMS (if indicated):     Assets:  Communication Skills Desire for Improvement  Sleep:  Number of Hours: 4.25    Vital Signs:Blood pressure 100/68, pulse 73, temperature 97.7 F (36.5 C), temperature source Oral, resp. rate 16, height 5\' 4"  (1.626 m), weight 86.183 kg (190 lb), last menstrual period 08/28/2011. Current Medications: Current Facility-Administered Medications  Medication Dose Route Frequency Provider Last Rate Last Dose  . acetaminophen (TYLENOL) tablet 650 mg  650 mg Oral Q6H PRN Sanjuana Kava, NP      . albuterol (PROVENTIL  HFA;VENTOLIN HFA) 108 (90 BASE) MCG/ACT inhaler 2 puff  2 puff Inhalation Q6H PRN Sanjuana Kava, NP   2 puff at 09/12/11 1730  . alum & mag hydroxide-simeth (MAALOX/MYLANTA) 200-200-20 MG/5ML suspension 30 mL  30 mL Oral Q4H PRN Sanjuana Kava, NP      . amitriptyline (ELAVIL) tablet 25 mg  25 mg Oral QHS Mike Craze, MD   25 mg at 09/12/11 2153  . ferrous sulfate tablet 325 mg  325 mg Oral Q breakfast Sanjuana Kava, NP   325 mg at 09/13/11 0836  . FLUoxetine (PROZAC) capsule 40 mg  40 mg Oral QHS Mike Craze, MD   40 mg at 09/12/11 2120  . ibuprofen (ADVIL,MOTRIN) tablet 800 mg  800 mg Oral Q6H PRN Mike Craze, MD      . ipratropium (ATROVENT) nebulizer solution 500 mcg  500 mcg Nebulization QID Sanjuana Kava, NP   500 mcg at 09/13/11 0916  . magnesium hydroxide (MILK OF MAGNESIA) suspension 30 mL  30 mL Oral Daily PRN Sanjuana Kava, NP      . mometasone-formoterol (DULERA) inhaler 2 puff  2 puff Inhalation BID Sanjuana Kava, NP   2 puff at 09/13/11 0836  . nicotine (NICODERM CQ - dosed in mg/24 hours) patch 21 mg  21 mg Transdermal Q0600 Sanjuana Kava, NP   21 mg at 09/13/11 2130  . ondansetron (ZOFRAN) tablet 4 mg  4 mg Oral QID  PRN Sanjuana Kava, NP      . traZODone (DESYREL) tablet 100 mg  100 mg Oral QHS Sanjuana Kava, NP   100 mg at 09/12/11 2153    Lab Results:  No results found for this or any previous visit (from the past 48 hour(s)).  Physical Findings: AIMS: Facial and Oral Movements Muscles of Facial Expression: None, normal Lips and Perioral Area: None, normal Jaw: None, normal Tongue: None, normal,Extremity Movements Upper (arms, wrists, hands, fingers): None, normal Lower (legs, knees, ankles, toes): None, normal, Trunk Movements Neck, shoulders, hips: None, normal, Overall Severity Severity of abnormal movements (highest score from questions above): None, normal Incapacitation due to abnormal movements: None, normal Patient's awareness of abnormal movements (rate  only patient's report): No Awareness, Dental Status Current problems with teeth and/or dentures?: No Does patient usually wear dentures?: No  CIWA:    COWS:     Treatment Plan Summary: Daily contact with patient to assess and evaluate symptoms and progress in treatment Medication management Mood/anxiety less than 3/10 where the scale is 1 is the best and 10 is the worst No suicidal or homicidal thoughts for at least 48 hours.  Plan: Pt has met inpatient goals.  Will D/C today.  Chez Bulnes 09/13/2011, 2:22 PM

## 2011-09-13 NOTE — Progress Notes (Signed)
Pt has been observed out of her room talking to her peers and talking on the phone.  She attended group on the 300 hall.  She reports she has had a good day.  She denies any withdrawal symptoms at this time.  She denies SI/HI/AV.  She spoke of her son fondly and talked of what a good son he is.  She voiced no complaints/concerns.  She refused her Neb tx, saying she did not feel she needed it.  Pt encouraged to make her needs known to staff.  Pt is unsure of her discharge date.  Safety maintained with q15 minute checks.

## 2011-09-13 NOTE — Progress Notes (Signed)
[facility] Group Notes:  (Counselor/Nursing/MHT/Case Management/Adjunct)  09/13/2011 1:33 PM  Type of Therapy:  Psychoeducational Skills  Participation Level:  Minimal  Participation Quality:  Attentive  Affect:  Depressed  Cognitive:  Appropriate and Oriented  Insight:  Limited  Engagement in Group:  Limited  Engagement in Therapy:  n/a  Modes of Intervention:  Activity, Education, Problem-solving, Socialization and Support  Summary of Progress/Problems: [name] attended Psychoeducational group on labels. Kaylon participated in an activity labeling self and peers and choose to label herself as a Runner, broadcasting/film/video for the activity. Raymonda was quiet while group discussed what labels are, how we use them, and listed positive and negative labels they have used or been called. Masyn was given a homework assignment to list 10 words she has been labeled to find the reality of the situation/label.    Wandra Scot 09/13/2011, 1:33 PM

## 2011-09-13 NOTE — Progress Notes (Signed)
[facility] Case Management Discharge Plan:  Will you be returning to the same living situation after discharge: Yes,  return home At discharge, do you have transportation home?:Yes,  friend to transport home Do you have the ability to pay for your medications:Yes,  access to meds  Release of information consent forms completed and in the chart;  Patient's signature needed at discharge.  Patient to Follow up at:  Follow-up Information    Follow up with Arna Medici on 09/15/2011. (Appointment scheduled at 7:45 am, referral # [id number])    Contact information:   [address] [place], [place] [address] [phone]         Patient denies SI/HI:   Yes,  denies SI/HI    Safety Planning and Suicide Prevention discussed:  Yes,  discussed with pt today  Barrier to discharge identified:No.  Summary and Recommendations: Pt attended discharge planning group and actively participated in group.  SW provided pt with today's workbook.  Pt presents with calm mood and affect.  Pt rates depression and anxiety at a 0 today and denies SI.  Pt reports feeling stable to d/c today.  No recommendations from SW.  No further needs voiced by pt.  Pt stable to discharge.     Carmina Miller 09/13/2011, 1:53 PM

## 2011-09-13 NOTE — Progress Notes (Signed)
Pt. discharged from the unit.  Denies any SI/HI or A/V hallucinations.  RN reviewed her discharge medications with her and prescriptions as well as follow up appointment with voiced understanding.  She received all belongings out of her locker prior to leaving.  She was pleasant and thankful to staff upon leaving.

## 2011-09-13 NOTE — Progress Notes (Signed)
[facility] Group Notes: (Counselor/Nursing/MHT/Case Management/Adjunct) 09/13/2011   1:15-2:30pm Emotion Regulation  Type of Therapy:  Group Therapy  Participation Level:  Active  Participation Quality:  Appropriate, Sharing   Affect:  Appropriate  Cognitive:  Appropriate  Insight: Good  Engagement in Group: Good  Engagement in Therapy: Good  Modes of Intervention:  Support and Exploration  Summary of Progress/Problems: [name] processed her experience of anger, focusing on her action urge toward aggression or violence. She shared how anger feels in her body, and especially the need for release. [name] also explored ways to let go of suffering, relating to the concept of finding validation within oneself rather than looking for it from others. She stated that her mother is very critical of her, and that she often internalizes what her mother says rather than realizing that she does not have to believe it. [name] participated in focused breathing and found it helpful. She was called out of group for discharge before progressive muscle relaxation exercise.   Kari Palms, LCSW 09/13/2011  4:16 PM

## 2011-09-13 NOTE — Progress Notes (Signed)
Psychoeducational Group Note  Date:  09/13/2011 Time:  1100  Group Topic/Focus:  Personal Choices and Values:   The focus of this group is to help patients assess and explore the importance of values in their lives, how their values affect their decisions, how they express their values and what opposes their expression.  Participation Level:  Active  Participation Quality:  Appropriate, Attentive, Sharing and Supportive  Affect:  Appropriate  Cognitive:  Alert and Appropriate  Insight:  Good  Engagement in Group:  Good  Additional Comments:  Pt was very appropriate and sharing while attending group. Pt talked about not getting taken advantage of and staff reminded pt of the statement that she made yesterday about learning how to say "NO". Pt also stated that one her top values was making a home.  Sharyn Lull 09/13/2011, 2:28 PM

## 2011-09-13 NOTE — Progress Notes (Signed)
09/13/2011         Time: 1500      Group Topic/Focus: The focus of this group is on promoting emotional and psychological well-being through the process of creative expression, relaxation, socialization, fun and enjoyment.  Participation Level: Active  Participation Quality: Attentive  Affect: Appropriate  Cognitive: Oriented   Additional Comments: Patient left group early for discharge.   Damari Suastegui 09/13/2011 4:03 PM
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
## 2011-09-13 NOTE — Tx Team (Signed)
Interdisciplinary Treatment Plan Update (Adult)  Date:  09/13/2011  Time Reviewed:  10:41 AM   Progress in Treatment: Attending groups: Yes Participating in groups:  Yes Taking medication as prescribed: Yes Tolerating medication:  Yes Family/Significant other contact made:  Yes Patient understands diagnosis:  Yes Discussing patient identified problems/goals with staff:  Yes Medical problems stabilized or resolved:  Yes Denies suicidal/homicidal ideation: Yes Issues/concerns per patient self-inventory:  None identified Other: N/A  New problem(s) identified: None Identified  Reason for Continuation of Hospitalization: Stable to d/c  Interventions implemented related to continuation of hospitalization: Stable to d/c Additional comments: N/A  Estimated length of stay: D/C today  Discharge Plan: Pt will follow up at [facility] for medication management and therapy.   New goal(s): N/A  Review of initial/current patient goals per problem list:    1. Goal(s): Decrease depressive symptoms to 4 or less  Met: Yes Target date: by discharge  As evidenced by: [name] rates depressive at 0 today 2. Goal (s): Decrease anxiety symptoms to 4 or less  Met: Yes Target date: by discharge  As evidenced by: [name] rates anxiety at 0 3. Goal(s): Reduce potential for self-harm/suicide  Met: Yes  Target date: by discharge  As evidenced by: [name] denies suicidality today 4. Goal(s): Address substance abuse  Met: Yes  Target date: by discharge  As evidenced by: Attend 12 step meetings on 300 hall nightly - pt has been programming on 300 hall and will  Be referred to AA/NA meetings  Attendees: Patient:  [name] 09/13/2011 10:42 AM   Family:     Physician:  Orson Aloe, MD  09/13/2011  10:41 AM   Nursing:   Berneice Heinrich, RN 09/13/2011 10:42 AM   Case Manager:  Reyes Ivan, LCSWA 09/13/2011  10:41 AM   Counselor:  Angus Palms, LCSW 09/13/2011  10:41 AM   Other:      Other:     Other:     Other:      Scribe for Treatment Team:   Carmina Miller, 09/13/2011 , 10:41 AM

## 2011-09-14 NOTE — Progress Notes (Signed)
Patient Discharge Instructions:  After Visit Summary (AVS):   Faxed to:  09/14/2011 Psychiatric Admission Assessment Note:   Faxed to:  09/14/2011 Suicide Risk Assessment - Discharge Assessment:   Faxed to:  09/14/2011 Faxed/Sent to the Next Level Care provider:  09/14/2011  Faxed to [facility] @ [phone]  Heloise Purpura, Eduard Clos, 09/14/2011, 6:17 PM
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
## 2011-11-10 ENCOUNTER — Encounter (HOSPITAL_COMMUNITY): Payer: Self-pay | Admitting: Emergency Medicine

## 2011-11-10 ENCOUNTER — Emergency Department (HOSPITAL_COMMUNITY)
Admission: EM | Admit: 2011-11-10 | Discharge: 2011-11-10 | Disposition: A | Discharge status: Home / Self Care | Payer: Medicaid Other | Attending: Emergency Medicine | Admitting: Emergency Medicine

## 2011-11-10 DIAGNOSIS — Z79899 Other long term (current) drug therapy: Secondary | ICD-10-CM | POA: Insufficient documentation

## 2011-11-10 DIAGNOSIS — F1010 Alcohol abuse, uncomplicated: Secondary | ICD-10-CM | POA: Insufficient documentation

## 2011-11-10 DIAGNOSIS — J4489 Other specified chronic obstructive pulmonary disease: Secondary | ICD-10-CM | POA: Insufficient documentation

## 2011-11-10 DIAGNOSIS — J449 Chronic obstructive pulmonary disease, unspecified: Secondary | ICD-10-CM | POA: Insufficient documentation

## 2011-11-10 LAB — CBC WITH DIFFERENTIAL/PLATELET
Basophils Absolute: 0 10*3/uL (ref 0.0–0.1)
Basophils Relative: 0 % (ref 0–1)
Eosinophils Relative: 4 % (ref 0–5)
HCT: 33.8 % — ABNORMAL LOW (ref 36.0–46.0)
Lymphocytes Relative: 57 % — ABNORMAL HIGH (ref 12–46)
MCH: 25.8 pg — ABNORMAL LOW (ref 26.0–34.0)
MCHC: 31.7 g/dL (ref 30.0–36.0)
MCV: 81.6 fL (ref 78.0–100.0)
Monocytes Absolute: 0.4 10*3/uL (ref 0.1–1.0)
RDW: 15.1 % (ref 11.5–15.5)

## 2011-11-10 LAB — COMPREHENSIVE METABOLIC PANEL
AST: 14 U/L (ref 0–37)
CO2: 22 mEq/L (ref 19–32)
Calcium: 9.2 mg/dL (ref 8.4–10.5)
Creatinine, Ser: 0.71 mg/dL (ref 0.50–1.10)
GFR calc non Af Amer: >90 mL/min (ref >90)

## 2011-11-10 LAB — RAPID URINE DRUG SCREEN, HOSP PERFORMED
Benzodiazepines: NOT DETECTED
Cocaine: NOT DETECTED
Opiates: NOT DETECTED

## 2011-11-10 LAB — ETHANOL: Alcohol, Ethyl (B): <11 mg/dL (ref 0–11)

## 2011-11-10 LAB — SALICYLATE LEVEL: Salicylate Lvl: 0.1 mg/dL — ABNORMAL LOW (ref 2.8–20.0)

## 2011-11-10 LAB — ACETAMINOPHEN LEVEL: Acetaminophen (Tylenol), Serum: <15 ug/mL (ref 10–30)

## 2011-11-10 MED ORDER — ALBUTEROL SULFATE HFA 108 (90 BASE) MCG/ACT IN AERS
2.0000 | INHALATION_SPRAY | Freq: Four times a day (QID) | RESPIRATORY_TRACT | Status: DC | PRN
  Administered 2011-11-10: 2 via RESPIRATORY_TRACT
  Filled 2011-11-10: qty 6.7

## 2011-11-10 MED ORDER — NICOTINE 21 MG/24HR TD PT24
21.0000 mg | MEDICATED_PATCH | Freq: Every day | TRANSDERMAL | Status: DC
  Administered 2011-11-10: 21 mg via TRANSDERMAL
  Filled 2011-11-10: qty 1

## 2011-11-10 MED ORDER — FLUOXETINE HCL 20 MG PO CAPS
40.0000 mg | ORAL_CAPSULE | Freq: Every day | ORAL | Status: DC
  Filled 2011-11-10 (×2): qty 2

## 2011-11-10 MED ORDER — LORAZEPAM 1 MG PO TABS
1.0000 mg | ORAL_TABLET | ORAL | Status: DC | PRN
  Filled 2011-11-10: qty 1

## 2011-11-10 MED ORDER — ALBUTEROL SULFATE (5 MG/ML) 0.5% IN NEBU
2.5000 mg | INHALATION_SOLUTION | Freq: Once | RESPIRATORY_TRACT | Status: AC
  Administered 2011-11-10: 2.5 mg via RESPIRATORY_TRACT
  Filled 2011-11-10: qty 0.5

## 2011-11-10 MED ORDER — TRAZODONE HCL 50 MG PO TABS
100.0000 mg | ORAL_TABLET | Freq: Every day | ORAL | Status: DC
  Filled 2011-11-10: qty 1

## 2011-11-10 MED ORDER — IPRATROPIUM BROMIDE 0.02 % IN SOLN
0.5000 mg | Freq: Once | RESPIRATORY_TRACT | Status: AC
  Administered 2011-11-10: 0.5 mg via RESPIRATORY_TRACT
  Filled 2011-11-10: qty 2.5

## 2011-11-10 MED ORDER — ONDANSETRON HCL 4 MG PO TABS: 4.0000 mg | ORAL_TABLET | Freq: Three times a day (TID) | ORAL | Status: DC | PRN

## 2011-11-10 NOTE — ED Notes (Signed)
Patient presents to ER stating she wants help with alcohol detox.  States last drink was about 30 minutes ago.  Patient states she usually drinks an 18 pack per day.

## 2011-11-10 NOTE — BH Assessment (Signed)
Assessment Note   [name] is an 36 y.o. female.  Pt reports being sober for 18 months, relapsed, got detox and was sober for 6 months relapsed and has been drinking a case of beer daily for the past 10 months.  She reports having a hard time staying sober but wants to give it another try. Pt denies s/i, h/i and is not psychotic.  Pt is requesting to got to [facility] again as she got detox  there November 2012.      Axis I: Alcohol Dependency Axis II: Deferred Axis III:  Past Medical History  Diagnosis Date  . COPD (chronic obstructive pulmonary disease)   . Asthma   . Depression   . Tobacco abuse   . Alcohol abuse   . Anemia 01/31/2011  . Hypoxia 02/01/2011  . Needs sleep apnea assessment 2013.07.31   Axis IV: other psychosocial or environmental problems and problems related to social environment Axis V: 31-40 impairment in reality testing         Past Medical History:  Past Medical History  Diagnosis Date  . COPD (chronic obstructive pulmonary disease)   . Asthma   . Depression   . Tobacco abuse   . Alcohol abuse   . Anemia 01/31/2011  . Hypoxia 02/01/2011  . Needs sleep apnea assessment 2013.07.31    Past Surgical History  Procedure Date  . Back surgery   . Cesarean section   . Dilation and curettage of uterus     Family History:  Family History  Problem Relation Age of Onset  . Hypothyroidism Mother   . Coronary artery disease Mother     Social History:  reports that she has been smoking Cigarettes.  She has been smoking about 1 pack per day. She does not have any smokeless tobacco history on file. She reports that she drinks alcohol. She reports that she does not use illicit drugs.  Additional Social History:  Alcohol / Drug Use Pain Medications: na Prescriptions: na Over the Counter: na History of alcohol / drug use?: Yes Substance #1 Name of Substance 1: alcohol 1 - Age of First Use: 11 1 - Amount (size/oz):  case 1 - Frequency:  daily 1 - Duration: 10 months 1 - Last Use / Amount: today 10  CIWA: CIWA-Ar BP: 88/56 mmHg Pulse Rate: 86  Nausea and Vomiting: mild nausea with no vomiting Tactile Disturbances: none Tremor: two Auditory Disturbances: very mild harshness or ability to frighten Paroxysmal Sweats: two Visual Disturbances: very mild sensitivity Anxiety: two Headache, Fullness in Head: mild Agitation: somewhat more than normal activity Orientation and Clouding of Sensorium: oriented and can do serial additions CIWA-Ar Total: 12  COWS:    Allergies: No Known Allergies  Home Medications:  (Not in a hospital admission)  OB/GYN Status:  Patient's last menstrual period was 10/23/2011.  General Assessment Data ACT Assessment: Yes Living Arrangements: Parent Can pt return to current living arrangement?: Yes Admission Status: Voluntary Is patient capable of signing voluntary admission?: Yes Transfer from: Acute Hospital Referral Source: MD     Risk to self Suicidal Ideation: No Suicidal Intent: No Is patient at risk for suicide?: No Suicidal Plan?: No Access to Means: No What has been your use of drugs/alcohol within the last 12 months?: beer Previous Attempts/Gestures: Yes How many times?: 1  Other Self Harm Risks: na Triggers for Past Attempts: Other personal contacts;Other (Comment) (break up with girlfriend and job loss) Intentional Self Injurious Behavior: None Family Suicide History: No  Recent stressful life event(s): Other (Comment) (increased drinking) Persecutory voices/beliefs?: No Depression: Yes Depression Symptoms: Despondent;Loss of interest in usual pleasures Substance abuse history and/or treatment for substance abuse?: Yes Suicide prevention information given to non-admitted patients: Not applicable  Risk to Others Homicidal Ideation: No Thoughts of Harm to Others: No Current Homicidal Intent: No Current Homicidal Plan: No Access to Homicidal Means: No History of  harm to others?: No Assessment of Violence: None Noted Violent Behavior Description: na Does patient have access to weapons?: No Criminal Charges Pending?: No Does patient have a court date: No  Psychosis Hallucinations: None noted Delusions: None noted  Mental Status Report Appear/Hygiene: Improved Eye Contact: Good Motor Activity: Freedom of movement Speech: Logical/coherent Level of Consciousness: Alert Mood: Depressed Affect: Appropriate to circumstance Anxiety Level: Minimal Thought Processes: Coherent;Relevant Judgement: Unimpaired Orientation: Person;Place;Time;Situation Obsessive Compulsive Thoughts/Behaviors: None  Cognitive Functioning Concentration: Normal Memory: Recent Intact;Remote Intact IQ: Average Insight: Poor Impulse Control: Poor Appetite: Good Sleep: No Change Total Hours of Sleep: 6  Vegetative Symptoms: None  ADLScreening [facility] Assessment Services) Patient's cognitive ability adequate to safely complete daily activities?: Yes Patient able to express need for assistance with ADLs?: Yes Independently performs ADLs?: Yes (appropriate for developmental age)  Abuse/Neglect [facility]) Physical Abuse: Denies Verbal Abuse: Denies Sexual Abuse: Denies  Prior Inpatient Therapy Prior Inpatient Therapy: Yes Prior Therapy Dates: 3-4 monthas ago [facility]  nov. 2012 [facility] Prior Therapy Fac[facility]Provider(s): [facility] Reason for Treatment: DEPRESSION    DETOX  Prior Outpatient Therapy Prior Outpatient Therapy: No  ADL Screening (condition at time of admission) Patient's cognitive ability adequate to safely complete daily activities?: Yes Patient able to express need for assistance with ADLs?: Yes Independently performs ADLs?: Yes (appropriate for developmental age) Weakness of Legs: None  Home Assistive Devices/Equipment Home Assistive Devices/Equipment: None  Therapy Consults (therapy consults require a physician order) PT Evaluation Needed: No OT  Evalulation Needed: No SLP Evaluation Needed: No Abuse/Neglect Assessment (Assessment to be complete while patient is alone) Physical Abuse: Denies Verbal Abuse: Denies Sexual Abuse: Denies Exploitation of patient/patient's resources: Denies Self-Neglect: Denies Values / Beliefs Cultural Requests During Hospitalization: None Spiritual Requests During Hospitalization: None Consults Spiritual Care Consult Needed: No Social Work Consult Needed: No Merchant [employer] officer (For Healthcare) Advance Directive: Patient does not have advance directive Pre-existing out of facility DNR order (yellow form or pink MOST form): No    Additional Information 1:1 In Past 12 Months?: No CIRT Risk: No Elopement Risk: No Does patient have medical clearance?: Yes     Disposition: [facility]FERRED TO [facility] Disposition Disposition of Patient: Referred to (PENDING [facility]) Patient referred to: [facility]  On Site Evaluation by:   Reviewed with Physician:  DR Terie Purser Winford 11/10/2011 1:51 AM

## 2011-11-10 NOTE — BH Assessment (Signed)
Assessment Note   [name] is an 36 y.o. female. SPOKE WITH MONICA AT [facility].  PT ACCEPTED PENDING [facility] AUTHORIZATION.  CALLED DAVID AT [facility] # [id number] APPROVED FOR 3 DAYS 11/10/11-05/12/11 REVIEW 11/13/11.      Axis I: Alcohol Dependency Axis II: Deferred Axis III:  Past Medical History  Diagnosis Date  . COPD (chronic obstructive pulmonary disease)   . Asthma   . Depression   . Tobacco abuse   . Alcohol abuse   . Anemia 01/31/2011  . Hypoxia 02/01/2011  . Needs sleep apnea assessment 2013.07.31   Axis IV: other psychosocial or environmental problems and problems related to social environment Axis V: 21-30 behavior considerably influenced by delusions or hallucinations OR serious impairment in judgment, communication OR inability to function in almost all areas       Past Medical History:  Past Medical History  Diagnosis Date  . COPD (chronic obstructive pulmonary disease)   . Asthma   . Depression   . Tobacco abuse   . Alcohol abuse   . Anemia 01/31/2011  . Hypoxia 02/01/2011  . Needs sleep apnea assessment 2013.07.31    Past Surgical History  Procedure Date  . Back surgery   . Cesarean section   . Dilation and curettage of uterus     Family History:  Family History  Problem Relation Age of Onset  . Hypothyroidism Mother   . Coronary artery disease Mother     Social History:  reports that she has been smoking Cigarettes.  She has been smoking about 1 pack per day. She does not have any smokeless tobacco history on file. She reports that she drinks alcohol. She reports that she does not use illicit drugs.  Additional Social History:  Alcohol / Drug Use Pain Medications: na Prescriptions: na Over the Counter: na History of alcohol / drug use?: Yes Substance #1 Name of Substance 1: alcohol 1 - Age of First Use: 11 1 - Amount (size/oz):  case 1 - Frequency: daily 1 - Duration: 10 months 1 - Last Use / Amount: today  10  CIWA: CIWA-Ar BP: 92/56 mmHg Pulse Rate: 78  Nausea and Vomiting: mild nausea with no vomiting Tactile Disturbances: none Tremor: two Auditory Disturbances: very mild harshness or ability to frighten Paroxysmal Sweats: two Visual Disturbances: very mild sensitivity Anxiety: two Headache, Fullness in Head: mild Agitation: somewhat more than normal activity Orientation and Clouding of Sensorium: oriented and can do serial additions CIWA-Ar Total: 12  COWS:    Allergies: No Known Allergies  Home Medications:  (Not in a hospital admission)  OB/GYN Status:  Patient's last menstrual period was 10/23/2011.  General Assessment Data Location of Assessment: [facility] ACT Assessment: Yes Living Arrangements: Parent Can pt return to current living arrangement?: Yes Admission Status: Voluntary Is patient capable of signing voluntary admission?: Yes Transfer from: Acute Hospital Referral Source: MD     Risk to self Suicidal Ideation: No Suicidal Intent: No Is patient at risk for suicide?: No Suicidal Plan?: No Access to Means: No What has been your use of drugs/alcohol within the last 12 months?: beer Previous Attempts/Gestures: Yes How many times?: 1  Other Self Harm Risks: na Triggers for Past Attempts: Other personal contacts;Other (Comment) (break up with girlfriend and job loss) Intentional Self Injurious Behavior: None Family Suicide History: No Recent stressful life event(s): Other (Comment) (increased drinking) Persecutory voices/beliefs?: No Depression: Yes Depression Symptoms: Despondent;Loss of interest in usual pleasures Substance abuse history and/or treatment for  substance abuse?: Yes Suicide prevention information given to non-admitted patients: Not applicable  Risk to Others Homicidal Ideation: No Thoughts of Harm to Others: No Current Homicidal Intent: No Current Homicidal Plan: No Access to Homicidal Means: No History of harm to others?:  No Assessment of Violence: None Noted Violent Behavior Description: na Does patient have access to weapons?: No Criminal Charges Pending?: No Does patient have a court date: No  Psychosis Hallucinations: None noted Delusions: None noted  Mental Status Report Appear/Hygiene: Improved Eye Contact: Good Motor Activity: Freedom of movement Speech: Logical/coherent Level of Consciousness: Alert Mood: Depressed Affect: Appropriate to circumstance Anxiety Level: Minimal Thought Processes: Coherent;Relevant Judgement: Unimpaired Orientation: Person;Place;Time;Situation Obsessive Compulsive Thoughts/Behaviors: None  Cognitive Functioning Concentration: Normal Memory: Recent Intact;Remote Intact IQ: Average Insight: Poor Impulse Control: Poor Appetite: Good Sleep: No Change Total Hours of Sleep: 6  Vegetative Symptoms: None  ADLScreening [facility] Assessment Services) Patient's cognitive ability adequate to safely complete daily activities?: Yes Patient able to express need for assistance with ADLs?: Yes Independently performs ADLs?: Yes (appropriate for developmental age)  Abuse/Neglect [facility]) Physical Abuse: Denies Verbal Abuse: Denies Sexual Abuse: Denies  Prior Inpatient Therapy Prior Inpatient Therapy: Yes Prior Therapy Dates: 3-4 monthas ago [facility]  nov. 2012 [facility] Prior Therapy Facilty/Provider(s): [facility]  [facility] Reason for Treatment: DEPRESSION    DETOX  Prior Outpatient Therapy Prior Outpatient Therapy: No  ADL Screening (condition at time of admission) Patient's cognitive ability adequate to safely complete daily activities?: Yes Patient able to express need for assistance with ADLs?: Yes Independently performs ADLs?: Yes (appropriate for developmental age) Weakness of Legs: None  Home Assistive Devices/Equipment Home Assistive Devices/Equipment: None  Therapy Consults (therapy consults require a physician order) PT Evaluation Needed: No OT Evalulation Needed:  No SLP Evaluation Needed: No Abuse/Neglect Assessment (Assessment to be complete while patient is alone) Physical Abuse: Denies Verbal Abuse: Denies Sexual Abuse: Denies Exploitation of patient/patient's resources: Denies Self-Neglect: Denies Values / Beliefs Cultural Requests During Hospitalization: None Spiritual Requests During Hospitalization: None Consults Spiritual Care Consult Needed: No Social Work Consult Needed: No Merchant [employer] officer (For Healthcare) Advance Directive: Patient does not have advance directive Pre-existing out of facility DNR order (yellow form or pink MOST form): No    Additional Information 1:1 In Past 12 Months?: No CIRT Risk: No Elopement Risk: No Does patient have medical clearance?: Yes     Disposition:  Disposition Disposition of Patient: Referred to (PT ACCEPTED AT [facility]) Patient referred to: [facility]  On Site Evaluation by:   Reviewed with Physician:  DR Raoul Pitch Winford 11/10/2011 2:42 PM

## 2011-11-10 NOTE — ED Notes (Signed)
Pt reports that she usually takes an albuterol breathing treatment every morning.  Upon ausculation, pt has some expiratory wheezing.  Notified edp

## 2011-11-10 NOTE — ED Notes (Signed)
[name] (mom): [phone] cell # is the number to call if we need to reach her. Patient's mother is taking her belongings home with her, patient is aware.

## 2011-11-10 NOTE — ED Notes (Signed)
Samson Frederic here ( ACT Team) working on placement for patient.

## 2011-11-10 NOTE — ED Provider Notes (Signed)
History     CSN: [id number]  Arrival date & time 11/10/11  0006   First MD Initiated Contact with Patient 11/10/11 [phone]      Chief Complaint  Patient presents with  . V70.1     Patient is a 36 y.o. female presenting with alcohol problem. The history is provided by the patient.  Alcohol Problem This is a recurrent problem. The current episode started more than 1 week ago. The problem occurs constantly. The problem has been gradually worsening. Pertinent negatives include no chest pain, no abdominal pain and no shortness of breath. Nothing aggravates the symptoms. Nothing relieves the symptoms. She has tried nothing for the symptoms.  pt presents requesting help with alcohol detox She denies SI at this time She reports drinking up to 18pack/day for months No cp/abdominal pain.  No seizures are reported.  No weakness is reported   Past Medical History  Diagnosis Date  . COPD (chronic obstructive pulmonary disease)   . Asthma   . Depression   . Tobacco abuse   . Alcohol abuse   . Anemia 01/31/2011  . Hypoxia 02/01/2011  . Needs sleep apnea assessment 2013.07.31    Past Surgical History  Procedure Date  . Back surgery   . Cesarean section   . Dilation and curettage of uterus     Family History  Problem Relation Age of Onset  . Hypothyroidism Mother   . Coronary artery disease Mother     History  Substance Use Topics  . Smoking status: Current Every Day Smoker -- 1.0 packs/day    Types: Cigarettes  . Smokeless tobacco: Not on file  . Alcohol Use: 0.0 oz/week    12-18 Cans of beer per week    OB History    Grav Para Term Preterm Abortions TAB SAB Ect Mult Living                  Review of Systems  Constitutional: Negative for fever.  Respiratory: Negative for shortness of breath.   Cardiovascular: Negative for chest pain.  Gastrointestinal: Negative for abdominal pain.  Neurological: Negative for seizures.  Psychiatric/Behavioral: Negative for suicidal  ideas.  All other systems reviewed and are negative.    Allergies  Review of patient's allergies indicates no known allergies.  Home Medications   Current Outpatient Rx  Name Route Sig Dispense Refill  . ALBUTEROL SULFATE HFA 108 (90 BASE) MCG/ACT IN AERS Inhalation Inhale 2 puffs into the lungs every 6 (six) hours as needed for wheezing. 1 Inhaler 2  . ALBUTEROL SULFATE (2.5 MG/3ML) 0.083% IN NEBU Nebulization Take 3 mLs (2.5 mg total) by nebulization every 6 (six) hours as needed for wheezing or shortness of breath. For asthma 75 mL 1  . AMITRIPTYLINE HCL 25 MG PO TABS Oral Take 1 tablet (25 mg total) by mouth at bedtime. For pain management, depression, and insomnia. 30 tablet 0  . CYCLOBENZAPRINE HCL 5 MG PO TABS Oral Take 5 mg by mouth 3 (three) times daily as needed. For back pain    . FERROUS SULFATE 325 (65 FE) MG PO TABS Oral Take 1 tablet (325 mg total) by mouth daily with breakfast. For anemia 30 tablet 0  . FLUOXETINE HCL 40 MG PO CAPS Oral Take 1 capsule (40 mg total) by mouth at bedtime. For depression. 30 capsule 0  . IBUPROFEN 200 MG PO TABS Oral Take 800 mg by mouth every 6 (six) hours as needed. For back pain    .  IPRATROPIUM BROMIDE 0.02 % IN SOLN Nebulization Take 500 mcg by nebulization 4 (four) times daily as needed. For shortness of breath    . MOMETASONE FURO-FORMOTEROL FUM 100-5 MCG/ACT IN AERO Inhalation Inhale 2 puffs into the lungs 2 (two) times daily. For asthma 8.8 g 0  . TRAZODONE HCL 100 MG PO TABS Oral Take 1 tablet (100 mg total) by mouth at bedtime. For insomnia. 30 tablet 0    BP 88/56  Pulse 86  Temp 97.5 F (36.4 C) (Oral)  Resp 20  Ht 5\' 5"  (1.651 m)  Wt 190 lb (86.183 kg)  BMI 31.62 kg/m2  SpO2 100%  LMP 10/23/2011 BP 92/52  Pulse 86  Temp 97.5 F (36.4 C) (Oral)  Resp 18  Ht 5\' 5"  (1.651 m)  Wt 190 lb (86.183 kg)  BMI 31.62 kg/m2  SpO2 95%  LMP 10/23/2011   Physical Exam CONSTITUTIONAL: Well developed/well nourished HEAD AND  FACE: Normocephalic/atraumatic EYES: EOMI/PERRL ENMT: Mucous membranes moist NECK: supple no meningeal signs SPINE:entire spine nontender CV: S1/S2 noted, no murmurs/rubs/gallops noted LUNGS: Lungs are clear to auscultation bilaterally, no apparent distress ABDOMEN: soft, nontender, no rebound or guarding GU:no cva tenderness NEURO: Pt is awake/alert, moves all extremitiesx4, no distress noted, conversant EXTREMITIES: pulses normal, full ROM SKIN: warm, color normal PSYCH: no abnormalities of mood noted  ED Course  Procedures   Labs Reviewed  CBC WITH DIFFERENTIAL - Abnormal; Notable for the following:    Hemoglobin 10.7 (*)     HCT 33.8 (*)     MCH 25.8 (*)     Neutrophils Relative 34 (*)     Lymphocytes Relative 57 (*)     Lymphs Abs 4.2 (*)     All other components within normal limits  COMPREHENSIVE METABOLIC PANEL - Abnormal; Notable for the following:    Total Bilirubin 0.1 (*)     All other components within normal limits  ETHANOL - Abnormal; Notable for the following:    Alcohol, Ethyl (B) 177 (*)     All other components within normal limits  URINE RAPID DRUG SCREEN (HOSP PERFORMED)  ACETAMINOPHEN LEVEL  PREGNANCY, URINE  SALICYLATE LEVEL    3:03 AM Pt stable at this time 3:48 AM Vitals improved Labs unremarkable Currently medically stable, she is sleeping and resting comfortably She has already been seen by [facility], awaiting placement potentially at [facility] Will need reassessment in the morning  MDM  Nursing notes including past medical history and social history reviewed and considered in documentation Labs/vital reviewed and considered         Joya Gaskins, MD 11/10/11 [phone]

## 2011-11-10 NOTE — ED Notes (Signed)
[name]  [phone].

## 2011-11-10 NOTE — ED Notes (Signed)
Dr Bebe Shaggy assessed patient, advised that at this time, he feels that the patient is here more for detox and is not a danger to self and that a sitter is no longer necessary. Paperwork signed. Still awaiting to see if mental health can get patient placement for detox.

## 2012-01-14 ENCOUNTER — Encounter (HOSPITAL_COMMUNITY): Payer: Self-pay | Admitting: *Deleted

## 2012-01-14 ENCOUNTER — Emergency Department (HOSPITAL_COMMUNITY)
Admission: EM | Admit: 2012-01-14 | Discharge: 2012-01-15 | Disposition: A | Discharge status: Other Acute Inpatient Hospital | Payer: 59 | Source: Home / Self Care | Attending: Emergency Medicine | Admitting: Emergency Medicine

## 2012-01-14 DIAGNOSIS — J449 Chronic obstructive pulmonary disease, unspecified: Secondary | ICD-10-CM | POA: Insufficient documentation

## 2012-01-14 DIAGNOSIS — Z79899 Other long term (current) drug therapy: Secondary | ICD-10-CM | POA: Insufficient documentation

## 2012-01-14 DIAGNOSIS — J4489 Other specified chronic obstructive pulmonary disease: Secondary | ICD-10-CM | POA: Insufficient documentation

## 2012-01-14 DIAGNOSIS — F329 Major depressive disorder, single episode, unspecified: Secondary | ICD-10-CM | POA: Insufficient documentation

## 2012-01-14 DIAGNOSIS — F3289 Other specified depressive episodes: Secondary | ICD-10-CM | POA: Insufficient documentation

## 2012-01-14 DIAGNOSIS — Z862 Personal history of diseases of the blood and blood-forming organs and certain disorders involving the immune mechanism: Secondary | ICD-10-CM | POA: Insufficient documentation

## 2012-01-14 DIAGNOSIS — J45909 Unspecified asthma, uncomplicated: Secondary | ICD-10-CM | POA: Insufficient documentation

## 2012-01-14 DIAGNOSIS — R062 Wheezing: Secondary | ICD-10-CM | POA: Insufficient documentation

## 2012-01-14 DIAGNOSIS — Z720 Tobacco use: Secondary | ICD-10-CM

## 2012-01-14 DIAGNOSIS — F1020 Alcohol dependence, uncomplicated: Secondary | ICD-10-CM | POA: Insufficient documentation

## 2012-01-14 DIAGNOSIS — F17200 Nicotine dependence, unspecified, uncomplicated: Secondary | ICD-10-CM | POA: Insufficient documentation

## 2012-01-14 LAB — CBC
MCH: 25.7 pg — ABNORMAL LOW (ref 26.0–34.0)
MCHC: 31.9 g/dL (ref 30.0–36.0)
Platelets: 252 10*3/uL (ref 150–400)
RDW: 14.8 % (ref 11.5–15.5)

## 2012-01-14 LAB — COMPREHENSIVE METABOLIC PANEL
ALT: 10 U/L (ref 0–35)
AST: 15 U/L (ref 0–37)
Albumin: 3.9 g/dL (ref 3.5–5.2)
Calcium: 9.4 mg/dL (ref 8.4–10.5)
GFR calc Af Amer: >90 mL/min (ref >90)
Potassium: 3.8 mEq/L (ref 3.5–5.1)
Sodium: 140 mEq/L (ref 135–145)
Total Protein: 7.1 g/dL (ref 6.0–8.3)

## 2012-01-14 LAB — RAPID URINE DRUG SCREEN, HOSP PERFORMED
Benzodiazepines: NOT DETECTED
Cocaine: NOT DETECTED
Opiates: NOT DETECTED
Tetrahydrocannabinol: NOT DETECTED

## 2012-01-14 LAB — ACETAMINOPHEN LEVEL: Acetaminophen (Tylenol), Serum: <15 ug/mL (ref 10–30)

## 2012-01-14 MED ORDER — FLUOXETINE HCL 40 MG PO CAPS: 40.0000 mg | ORAL_CAPSULE | Freq: Every day | ORAL | Status: DC

## 2012-01-14 MED ORDER — TRAZODONE HCL 50 MG PO TABS
100.0000 mg | ORAL_TABLET | Freq: Every day | ORAL | Status: DC
  Administered 2012-01-15: 100 mg via ORAL
  Filled 2012-01-14: qty 2

## 2012-01-14 MED ORDER — VITAMIN B-1 100 MG PO TABS
100.0000 mg | ORAL_TABLET | Freq: Every day | ORAL | Status: DC
  Administered 2012-01-15: 100 mg via ORAL
  Filled 2012-01-14: qty 1

## 2012-01-14 MED ORDER — ALBUTEROL SULFATE (5 MG/ML) 0.5% IN NEBU
2.5000 mg | INHALATION_SOLUTION | Freq: Once | RESPIRATORY_TRACT | Status: AC
  Administered 2012-01-14: 2.5 mg via RESPIRATORY_TRACT

## 2012-01-14 MED ORDER — LORAZEPAM 1 MG PO TABS
0.0000 mg | ORAL_TABLET | Freq: Four times a day (QID) | ORAL | Status: DC
  Administered 2012-01-15: 1 mg via ORAL
  Filled 2012-01-14: qty 1

## 2012-01-14 MED ORDER — LORAZEPAM 1 MG PO TABS: 0.0000 mg | ORAL_TABLET | Freq: Two times a day (BID) | ORAL | Status: DC

## 2012-01-14 MED ORDER — NICOTINE 21 MG/24HR TD PT24
21.0000 mg | MEDICATED_PATCH | Freq: Every day | TRANSDERMAL | Status: DC
  Administered 2012-01-15: 21 mg via TRANSDERMAL
  Filled 2012-01-14: qty 1

## 2012-01-14 MED ORDER — ALUM & MAG HYDROXIDE-SIMETH 200-200-20 MG/5ML PO SUSP: 30.0000 mL | ORAL | Status: DC | PRN

## 2012-01-14 MED ORDER — ACETAMINOPHEN 325 MG PO TABS: 650.0000 mg | ORAL_TABLET | ORAL | Status: DC | PRN

## 2012-01-14 MED ORDER — IPRATROPIUM BROMIDE 0.02 % IN SOLN
0.5000 mg | Freq: Once | RESPIRATORY_TRACT | Status: AC
  Administered 2012-01-14: 0.5 mg via RESPIRATORY_TRACT
  Filled 2012-01-14: qty 2.5

## 2012-01-14 MED ORDER — IBUPROFEN 200 MG PO TABS: 600.0000 mg | ORAL_TABLET | Freq: Three times a day (TID) | ORAL | Status: DC | PRN

## 2012-01-14 MED ORDER — LORAZEPAM 2 MG/ML IJ SOLN: 1.0000 mg | Freq: Four times a day (QID) | INTRAMUSCULAR | Status: DC | PRN

## 2012-01-14 MED ORDER — MOMETASONE FURO-FORMOTEROL FUM 100-5 MCG/ACT IN AERO
2.0000 | INHALATION_SPRAY | Freq: Two times a day (BID) | RESPIRATORY_TRACT | Status: DC
  Administered 2012-01-15 (×2): 2 via RESPIRATORY_TRACT
  Filled 2012-01-14: qty 8.8

## 2012-01-14 MED ORDER — FOLIC ACID 1 MG PO TABS
1.0000 mg | ORAL_TABLET | Freq: Every day | ORAL | Status: DC
  Administered 2012-01-15: 1 mg via ORAL
  Filled 2012-01-14: qty 1

## 2012-01-14 MED ORDER — ONDANSETRON HCL 8 MG PO TABS
4.0000 mg | ORAL_TABLET | Freq: Three times a day (TID) | ORAL | Status: DC | PRN
  Filled 2012-01-14: qty 1

## 2012-01-14 MED ORDER — ADULT MULTIVITAMIN W/MINERALS CH
1.0000 | ORAL_TABLET | Freq: Every day | ORAL | Status: DC
  Administered 2012-01-15: 1 via ORAL
  Filled 2012-01-14: qty 1

## 2012-01-14 MED ORDER — ALBUTEROL SULFATE HFA 108 (90 BASE) MCG/ACT IN AERS: 2.0000 | INHALATION_SPRAY | Freq: Four times a day (QID) | RESPIRATORY_TRACT | Status: DC | PRN

## 2012-01-14 MED ORDER — FLUOXETINE HCL 20 MG PO CAPS
40.0000 mg | ORAL_CAPSULE | Freq: Every day | ORAL | Status: DC
  Administered 2012-01-15: 40 mg via ORAL
  Filled 2012-01-14 (×2): qty 2

## 2012-01-14 MED ORDER — ALBUTEROL SULFATE (5 MG/ML) 0.5% IN NEBU
2.5000 mg | INHALATION_SOLUTION | RESPIRATORY_TRACT | Status: DC | PRN
  Filled 2012-01-14: qty 0.5

## 2012-01-14 MED ORDER — THIAMINE HCL 100 MG/ML IJ SOLN: 100.0000 mg | Freq: Every day | INTRAMUSCULAR | Status: DC

## 2012-01-14 MED ORDER — ZOLPIDEM TARTRATE 5 MG PO TABS: 5.0000 mg | ORAL_TABLET | Freq: Every evening | ORAL | Status: DC | PRN

## 2012-01-14 MED ORDER — LORAZEPAM 1 MG PO TABS
1.0000 mg | ORAL_TABLET | Freq: Four times a day (QID) | ORAL | Status: DC | PRN
  Administered 2012-01-15: 1 mg via ORAL
  Filled 2012-01-14: qty 1

## 2012-01-14 NOTE — ED Notes (Addendum)
Security has wanded pt. And belongings have been taken and placed in locker. House Coverage aware unable to provide coverage. Charge Nurse aware.

## 2012-01-14 NOTE — ED Notes (Addendum)
States that she feels like if she can't stop drinking she will harm herself. States last drink was 3 hrs. With her drinking at least 13 beers today. Denies a plan. Attempted suicide in the past X 1 around 3-4 months ago. Attempted to OD on pills. Was found by someone and was taken to hospital stayed in a [place] [facility] for 5-7 days.

## 2012-01-14 NOTE — ED Notes (Addendum)
Pt states that she has been drinking for 20+ years and has tried to quit drinking before, but always starts back. Pt states last drink was 2 hours ago. Pt also complaining of not being able to keep anything on her stomach for the past couple of days due to trying to slowing quit drinking. Pt denies SI or HI at this time.

## 2012-01-15 ENCOUNTER — Encounter (HOSPITAL_COMMUNITY): Payer: Self-pay | Admitting: *Deleted

## 2012-01-15 ENCOUNTER — Inpatient Hospital Stay (HOSPITAL_COMMUNITY)
Admission: AD | Admit: 2012-01-15 | Discharge: 2012-01-19 | DRG: 897 | Disposition: A | Discharge status: Home / Self Care | Payer: 59 | Source: Ambulatory Visit | Attending: Psychiatry | Admitting: Psychiatry

## 2012-01-15 DIAGNOSIS — J449 Chronic obstructive pulmonary disease, unspecified: Secondary | ICD-10-CM | POA: Diagnosis present

## 2012-01-15 DIAGNOSIS — F10939 Alcohol use, unspecified with withdrawal, unspecified: Secondary | ICD-10-CM | POA: Diagnosis present

## 2012-01-15 DIAGNOSIS — F1020 Alcohol dependence, uncomplicated: Principal | ICD-10-CM | POA: Diagnosis present

## 2012-01-15 DIAGNOSIS — R0781 Pleurodynia: Secondary | ICD-10-CM

## 2012-01-15 DIAGNOSIS — J189 Pneumonia, unspecified organism: Secondary | ICD-10-CM

## 2012-01-15 DIAGNOSIS — F17200 Nicotine dependence, unspecified, uncomplicated: Secondary | ICD-10-CM | POA: Diagnosis present

## 2012-01-15 DIAGNOSIS — E871 Hypo-osmolality and hyponatremia: Secondary | ICD-10-CM

## 2012-01-15 DIAGNOSIS — D649 Anemia, unspecified: Secondary | ICD-10-CM

## 2012-01-15 DIAGNOSIS — J209 Acute bronchitis, unspecified: Secondary | ICD-10-CM

## 2012-01-15 DIAGNOSIS — M549 Dorsalgia, unspecified: Secondary | ICD-10-CM

## 2012-01-15 DIAGNOSIS — J4489 Other specified chronic obstructive pulmonary disease: Secondary | ICD-10-CM | POA: Diagnosis present

## 2012-01-15 DIAGNOSIS — F329 Major depressive disorder, single episode, unspecified: Secondary | ICD-10-CM | POA: Diagnosis present

## 2012-01-15 DIAGNOSIS — F1910 Other psychoactive substance abuse, uncomplicated: Secondary | ICD-10-CM

## 2012-01-15 DIAGNOSIS — F10239 Alcohol dependence with withdrawal, unspecified: Secondary | ICD-10-CM | POA: Diagnosis present

## 2012-01-15 DIAGNOSIS — J441 Chronic obstructive pulmonary disease with (acute) exacerbation: Secondary | ICD-10-CM

## 2012-01-15 DIAGNOSIS — R0902 Hypoxemia: Secondary | ICD-10-CM

## 2012-01-15 DIAGNOSIS — Z720 Tobacco use: Secondary | ICD-10-CM

## 2012-01-15 DIAGNOSIS — F411 Generalized anxiety disorder: Secondary | ICD-10-CM | POA: Diagnosis present

## 2012-01-15 MED ORDER — ADULT MULTIVITAMIN W/MINERALS CH
1.0000 | ORAL_TABLET | Freq: Every day | ORAL | Status: DC
  Administered 2012-01-16 – 2012-01-19 (×4): 1 via ORAL
  Filled 2012-01-15 (×6): qty 1

## 2012-01-15 MED ORDER — MAGNESIUM HYDROXIDE 400 MG/5ML PO SUSP: 30.0000 mL | Freq: Every day | ORAL | Status: DC | PRN

## 2012-01-15 MED ORDER — CHLORDIAZEPOXIDE HCL 25 MG PO CAPS: 25.0000 mg | ORAL_CAPSULE | Freq: Four times a day (QID) | ORAL | Status: AC | PRN

## 2012-01-15 MED ORDER — FLUOXETINE HCL 20 MG PO CAPS
40.0000 mg | ORAL_CAPSULE | Freq: Every day | ORAL | Status: DC
  Administered 2012-01-15 – 2012-01-18 (×4): 40 mg via ORAL
  Filled 2012-01-15 (×3): qty 2
  Filled 2012-01-15: qty 1
  Filled 2012-01-15: qty 28
  Filled 2012-01-15: qty 1
  Filled 2012-01-15 (×2): qty 2

## 2012-01-15 MED ORDER — LOPERAMIDE HCL 2 MG PO CAPS: 2.0000 mg | ORAL_CAPSULE | ORAL | Status: AC | PRN

## 2012-01-15 MED ORDER — HYDROXYZINE HCL 25 MG PO TABS: 25.0000 mg | ORAL_TABLET | Freq: Four times a day (QID) | ORAL | Status: AC | PRN

## 2012-01-15 MED ORDER — ALBUTEROL SULFATE HFA 108 (90 BASE) MCG/ACT IN AERS
2.0000 | INHALATION_SPRAY | Freq: Four times a day (QID) | RESPIRATORY_TRACT | Status: DC | PRN
  Administered 2012-01-16 – 2012-01-19 (×4): 2 via RESPIRATORY_TRACT
  Filled 2012-01-15: qty 6.7

## 2012-01-15 MED ORDER — VITAMIN B-1 100 MG PO TABS
100.0000 mg | ORAL_TABLET | Freq: Every day | ORAL | Status: DC
  Administered 2012-01-17 – 2012-01-19 (×3): 100 mg via ORAL
  Filled 2012-01-15 (×6): qty 1

## 2012-01-15 MED ORDER — CHLORDIAZEPOXIDE HCL 25 MG PO CAPS
25.0000 mg | ORAL_CAPSULE | Freq: Once | ORAL | Status: AC
  Administered 2012-01-15: 25 mg via ORAL
  Filled 2012-01-15: qty 1

## 2012-01-15 MED ORDER — THIAMINE HCL 100 MG/ML IJ SOLN
100.0000 mg | Freq: Once | INTRAMUSCULAR | Status: AC
  Administered 2012-01-15: 100 mg via INTRAMUSCULAR

## 2012-01-15 MED ORDER — ONDANSETRON 4 MG PO TBDP: 4.0000 mg | ORAL_TABLET | Freq: Four times a day (QID) | ORAL | Status: AC | PRN

## 2012-01-15 MED ORDER — ALBUTEROL SULFATE (5 MG/ML) 0.5% IN NEBU
2.5000 mg | INHALATION_SOLUTION | Freq: Four times a day (QID) | RESPIRATORY_TRACT | Status: DC | PRN
Start: 2012-01-15 — End: 2012-01-19

## 2012-01-15 MED ORDER — MOMETASONE FURO-FORMOTEROL FUM 100-5 MCG/ACT IN AERO
2.0000 | INHALATION_SPRAY | Freq: Two times a day (BID) | RESPIRATORY_TRACT | Status: DC
  Administered 2012-01-15 – 2012-01-19 (×8): 2 via RESPIRATORY_TRACT
  Filled 2012-01-15: qty 8.8

## 2012-01-15 MED ORDER — TRAZODONE HCL 50 MG PO TABS
50.0000 mg | ORAL_TABLET | Freq: Every evening | ORAL | Status: DC | PRN
  Administered 2012-01-15 – 2012-01-18 (×7): 50 mg via ORAL
  Filled 2012-01-15 (×4): qty 1
  Filled 2012-01-15: qty 28
  Filled 2012-01-15 (×8): qty 1
  Filled 2012-01-15: qty 28
  Filled 2012-01-15: qty 1

## 2012-01-15 MED ORDER — NICOTINE 21 MG/24HR TD PT24
21.0000 mg | MEDICATED_PATCH | Freq: Every day | TRANSDERMAL | Status: DC
  Administered 2012-01-16 – 2012-01-19 (×4): 21 mg via TRANSDERMAL
  Filled 2012-01-15 (×6): qty 1

## 2012-01-15 MED ORDER — ACETAMINOPHEN 325 MG PO TABS: 650.0000 mg | ORAL_TABLET | Freq: Four times a day (QID) | ORAL | Status: DC | PRN

## 2012-01-15 MED ORDER — FLUOXETINE HCL 40 MG PO CAPS: 40.0000 mg | ORAL_CAPSULE | Freq: Every day | ORAL | Status: DC

## 2012-01-15 MED ORDER — ALUM & MAG HYDROXIDE-SIMETH 200-200-20 MG/5ML PO SUSP: 30.0000 mL | ORAL | Status: DC | PRN

## 2012-01-15 NOTE — Progress Notes (Signed)
Vol admit requesting detox from alcohol.  Pt was here a few months ago for the same, then relapsed.  Pt reports it is hard to stay sober because of her place of residence.  She lives with her mother.  Pt also reported that she recently broke up with her girlfriend.   She states she had L ankle surgery 2 weeks ago to remove (4) cysts, and still has some swelling r/t the surgery.  She is prescribed Vicodin for pain.  Pt was advised to stay off her feet and keep the L ankle elevated as much as possible while she is here.  Pt voiced understanding.  Pt desires to go for long term treatment for her alcohol abuse issues.  Pt says she has used cocaine in the past, but has not used it since her last admission.  Pt was pleasant/cooperative with the admission process.  Pt was oriented to unit and room.  Safety checks q15 minutes initiated.

## 2012-01-15 NOTE — BH Assessment (Signed)
Assessment Note   Update:  Received call from [facility] stating pt accepted to [facility] by Dr. Dub Mikes to Dr. Dub Mikes to bed 301-2 and that pt could be transported.  Updated EDP Jacubowitz and ED staff.  Updated assessment disposition, completed assessment notification and support paperwork.  Faxed to [facility] to log.  ED staff to arrange transport via security to [facility] as pt is voluntary.     Disposition:  Disposition Disposition of Patient: Inpatient treatment program Type of inpatient treatment program: Adult Patient referred to: Other (Comment) (Pt accepted [facility])  On Site Evaluation by:   Reviewed with Physician:  Alyson Locket 01/15/2012 6:31 PM

## 2012-01-15 NOTE — ED Provider Notes (Signed)
History     CSN: [id number]  Arrival date & time 01/14/12  2142   First MD Initiated Contact with Patient 01/14/12 2300      Chief Complaint  Patient presents with  . Detox    . Suicidal    (Consider location/radiation/quality/duration/timing/severity/associated sxs/prior treatment) HPI 36 year old female presents to emergency room complaining of alcohol dependence. Patient reports she has been an alcoholic for over 20 years. She reports her last prolonged sobriety was 3 years ago when she was clean for 18 months. Patient reports she drinks normally just under 18 beers a day. Recently she has tried to cut back on this amount to slowly wean her off of alcohol, but she reports his not working. She denies using any other drugs. Patient smokes a pack a day. 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 She reports 4 months ago she had a suicide attempt, at that time was seen at behavioral health. No prior history of seizures from alcohol withdrawal. Patient last drank 2-3 hours ago. Past Medical History  Diagnosis Date  . COPD (chronic obstructive pulmonary disease)   . Asthma   . Depression   . Tobacco abuse   . Alcohol abuse   . Anemia 01/31/2011  . Hypoxia 02/01/2011  . Needs sleep apnea assessment 2013.07.31    Past Surgical History  Procedure Date  . Back surgery   . Cesarean section   . Dilation and curettage of uterus     Family History  Problem Relation Age of Onset  . Hypothyroidism Mother   . Coronary artery disease Mother     History  Substance Use Topics  . Smoking status: Current Every Day Smoker -- 1.0 packs/day    Types: Cigarettes  . Smokeless tobacco: Not on file  . Alcohol Use: 0.0 oz/week    12-18 Cans of beer per week    OB History    Grav Para Term Preterm Abortions TAB SAB Ect Mult Living                  Review of Systems  All other systems reviewed and are negative.   See  History of Present Illness; otherwise all other systems are reviewed and negative  Allergies  Review of patient's allergies indicates no known allergies.  Home Medications   Current Outpatient Rx  Name  Route  Sig  Dispense  Refill  . ALBUTEROL SULFATE HFA 108 (90 BASE) MCG/ACT IN AERS   Inhalation   Inhale 2 puffs into the lungs every 6 (six) hours as needed for wheezing.   1 Inhaler   2   . ALBUTEROL SULFATE (2.5 MG/3ML) 0.083% IN NEBU   Nebulization   Take 3 mLs (2.5 mg total) by nebulization every 6 (six) hours as needed for wheezing or shortness of breath. For asthma   75 mL   1   . FLUOXETINE HCL 40 MG PO CAPS   Oral   Take 1 capsule (40 mg total) by mouth at bedtime. For depression.   30 capsule   0   . HYDROCODONE-ACETAMINOPHEN 5-325 MG PO TABS   Oral   Take 1 tablet by mouth every 6 (six) hours as needed. For pain         . MOMETASONE FURO-FORMOTEROL FUM 100-5 MCG/ACT IN AERO   Inhalation   Inhale 2 puffs into the lungs 2 (two) times daily. For asthma   8.8 g  0   . TRAZODONE HCL 100 MG PO TABS   Oral   Take 1 tablet (100 mg total) by mouth at bedtime. For insomnia.   30 tablet   0     BP 106/59  Pulse 90  Temp 97.9 F (36.6 C) (Oral)  Resp 18  SpO2 99%  Physical Exam  Constitutional: She is oriented to person, place, and time. She appears well-developed and well-nourished.  HENT:  Head: Normocephalic and atraumatic.  Nose: Nose normal.  Mouth/Throat: Oropharynx is clear and moist.  Eyes: Conjunctivae normal and EOM are normal. Pupils are equal, round, and reactive to light.  Neck: Normal range of motion. Neck supple. No JVD present. No tracheal deviation present. No thyromegaly present.  Cardiovascular: Normal rate, regular rhythm, normal heart sounds and intact distal pulses.  Exam reveals no gallop and no friction rub.   No murmur heard. Pulmonary/Chest: Effort normal. No stridor. No respiratory distress. She has wheezes. She has no rales.  She exhibits no tenderness.  Abdominal: Soft. Bowel sounds are normal. She exhibits no distension and no mass. There is no tenderness. There is no rebound and no guarding.  Lymphadenopathy:    She has no cervical adenopathy.  Neurological: She is alert and oriented to person, place, and time. She exhibits normal muscle tone. Coordination normal.  Skin: Skin is warm and dry. No rash noted. No erythema. No pallor.  Psychiatric: She has a normal mood and affect. Her behavior is normal. Judgment and thought content normal.    ED Course  Procedures (including critical care time)  Labs Reviewed  CBC - Abnormal; Notable for the following:    Hemoglobin 11.7 (*)     MCH 25.7 (*)     All other components within normal limits  COMPREHENSIVE METABOLIC PANEL - Abnormal; Notable for the following:    Glucose, Bld 110 (*)     Total Bilirubin 0.2 (*)     All other components within normal limits  ETHANOL - Abnormal; Notable for the following:    Alcohol, Ethyl (B) 128 (*)     All other components within normal limits  SALICYLATE LEVEL - Abnormal; Notable for the following:    Salicylate Lvl <2.0 (*)     All other components within normal limits  ACETAMINOPHEN LEVEL  URINE RAPID DRUG SCREEN (HOSP PERFORMED)  POCT PREGNANCY, URINE   No results found.   1. Alcohol dependence   2. COPD (chronic obstructive pulmonary disease)   3. Tobacco abuse       MDM  36 year old female requesting alcohol detox. We'll place her on alcohol detox protocol, will have active diarrhea the patient. She is wheezing we'll treat her with albuterol as needed        Olivia Mackie, MD 01/15/12 [phone]
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
## 2012-01-15 NOTE — ED Notes (Signed)
Greg-ACT Team in with patient

## 2012-01-15 NOTE — Tx Team (Signed)
Initial Interdisciplinary Treatment Plan  PATIENT STRENGTHS: (choose at least two) Ability for insight Average or above average intelligence Capable of independent living General fund of knowledge Motivation for treatment/growth Supportive family/friends  PATIENT STRESSORS: Financial difficulties Health problems Marital or family conflict Substance abuse   PROBLEM LIST: Problem List/Patient Goals Date to be addressed Date deferred Reason deferred Estimated date of resolution  Alcohol abuse 12/2     Depression 12/2     COPD 12/2     Recent L ankle surgery                                     DISCHARGE CRITERIA:  Ability to meet basic life and health needs Adequate post-discharge living arrangements Improved stabilization in mood, thinking, and/or behavior Motivation to continue treatment in a less acute level of care Verbal commitment to aftercare and medication compliance Withdrawal symptoms are absent or subacute and managed without 24-hour nursing intervention  PRELIMINARY DISCHARGE PLAN: Attend aftercare/continuing care group Attend 12-step recovery group Outpatient therapy Placement in alternative living arrangements  PATIENT/FAMIILY INVOLVEMENT: This treatment plan has been presented to and reviewed with the patient, [name], and/or family member.  The patient and family have been given the opportunity to ask questions and make suggestions.  Jesus Genera [facility] 01/15/2012, 8:46 PM

## 2012-01-15 NOTE — BHH Counselor (Signed)
Patient has been accepted at [facility] by Dr. Dub Mikes pending bed availability.

## 2012-01-15 NOTE — ED Notes (Signed)
Offered a pt. A shower, she refused.  Pt. Stated, "I will take a shower when I get to [employer]."

## 2012-01-15 NOTE — ED Provider Notes (Signed)
6:30 PM patient alert pleasant cooperative ambulatory. Stable for transfer to [facility]  Doug Sou, MD 01/15/12 [phone]

## 2012-01-15 NOTE — ED Notes (Signed)
Pt. Belongings placed in locker # 2 after being inventoried. Totals recorded on list and placed on chart.

## 2012-01-16 ENCOUNTER — Encounter (HOSPITAL_COMMUNITY): Payer: Self-pay | Admitting: Psychiatry

## 2012-01-16 DIAGNOSIS — F10239 Alcohol dependence with withdrawal, unspecified: Secondary | ICD-10-CM | POA: Diagnosis present

## 2012-01-16 DIAGNOSIS — F1020 Alcohol dependence, uncomplicated: Secondary | ICD-10-CM | POA: Diagnosis present

## 2012-01-16 DIAGNOSIS — F411 Generalized anxiety disorder: Secondary | ICD-10-CM

## 2012-01-16 DIAGNOSIS — F329 Major depressive disorder, single episode, unspecified: Secondary | ICD-10-CM

## 2012-01-16 DIAGNOSIS — F10939 Alcohol use, unspecified with withdrawal, unspecified: Secondary | ICD-10-CM | POA: Diagnosis present

## 2012-01-16 MED ORDER — ADULT MULTIVITAMIN W/MINERALS CH: 1.0000 | ORAL_TABLET | Freq: Every day | ORAL | Status: DC

## 2012-01-16 MED ORDER — ONDANSETRON 4 MG PO TBDP: 4.0000 mg | ORAL_TABLET | Freq: Four times a day (QID) | ORAL | Status: DC | PRN

## 2012-01-16 MED ORDER — HYDROXYZINE HCL 25 MG PO TABS: 25.0000 mg | ORAL_TABLET | Freq: Four times a day (QID) | ORAL | Status: DC | PRN

## 2012-01-16 MED ORDER — CHLORDIAZEPOXIDE HCL 25 MG PO CAPS
25.0000 mg | ORAL_CAPSULE | ORAL | Status: AC
  Administered 2012-01-18 (×2): 25 mg via ORAL
  Filled 2012-01-16 (×2): qty 1

## 2012-01-16 MED ORDER — GABAPENTIN 100 MG PO CAPS
100.0000 mg | ORAL_CAPSULE | Freq: Three times a day (TID) | ORAL | Status: DC
  Administered 2012-01-16 – 2012-01-17 (×4): 100 mg via ORAL
  Filled 2012-01-16 (×10): qty 1

## 2012-01-16 MED ORDER — CHLORDIAZEPOXIDE HCL 25 MG PO CAPS
25.0000 mg | ORAL_CAPSULE | Freq: Every day | ORAL | Status: AC
  Administered 2012-01-19: 25 mg via ORAL
  Filled 2012-01-16: qty 1

## 2012-01-16 MED ORDER — CHLORDIAZEPOXIDE HCL 25 MG PO CAPS: 25.0000 mg | ORAL_CAPSULE | Freq: Four times a day (QID) | ORAL | Status: DC | PRN

## 2012-01-16 MED ORDER — VITAMIN B-1 100 MG PO TABS
100.0000 mg | ORAL_TABLET | Freq: Every day | ORAL | Status: DC
  Filled 2012-01-16: qty 1

## 2012-01-16 MED ORDER — CHLORDIAZEPOXIDE HCL 25 MG PO CAPS
25.0000 mg | ORAL_CAPSULE | Freq: Four times a day (QID) | ORAL | Status: AC
  Administered 2012-01-16 (×4): 25 mg via ORAL
  Filled 2012-01-16 (×4): qty 1

## 2012-01-16 MED ORDER — LOPERAMIDE HCL 2 MG PO CAPS: 2.0000 mg | ORAL_CAPSULE | ORAL | Status: DC | PRN

## 2012-01-16 MED ORDER — THIAMINE HCL 100 MG/ML IJ SOLN
100.0000 mg | Freq: Once | INTRAMUSCULAR | Status: AC
  Administered 2012-01-16: 100 mg via INTRAMUSCULAR

## 2012-01-16 MED ORDER — CYCLOBENZAPRINE HCL 10 MG PO TABS
5.0000 mg | ORAL_TABLET | Freq: Three times a day (TID) | ORAL | Status: DC
  Administered 2012-01-16 – 2012-01-18 (×9): 5 mg via ORAL
  Administered 2012-01-19: 08:00:00 via ORAL
  Filled 2012-01-16: qty 0.5
  Filled 2012-01-16: qty 1
  Filled 2012-01-16 (×2): qty 0.5
  Filled 2012-01-16: qty 1
  Filled 2012-01-16 (×9): qty 0.5

## 2012-01-16 MED ORDER — CHLORDIAZEPOXIDE HCL 25 MG PO CAPS
25.0000 mg | ORAL_CAPSULE | Freq: Three times a day (TID) | ORAL | Status: AC
  Administered 2012-01-17 (×3): 25 mg via ORAL
  Filled 2012-01-16 (×3): qty 1

## 2012-01-16 NOTE — Progress Notes (Signed)
Psychoeducational Group Note  Date:  01/16/2012 Time:  1100AM  Group Topic/Focus:  Recovery Goals:   The focus of this group is to identify appropriate goals for recovery and establish a plan to achieve them.  Participation Level:  Active  Participation Quality:  Appropriate, Attentive and Sharing  Affect:  Appropriate  Cognitive:  Appropriate  Insight:  Engaged  Engagement in Group:  Engaged  Additional Comments:  Staff explained to the patient that the purpose of this group is to educate them on recovery, and on setting mid-range to long-term personal goals for their recovery process.  Staff also explained that the group will also address topics including what recovery is, who goes through recovery, the first steps toward recovery, and setting realistic goals for recovery. Patient was asked to identify two areas of their life in which they would like to make a change toward recovery, and set a specific measurable goal to address each change identified. Patient was provided with a homework assignment regarding how this goal impacts their personal recovery. Staff concluded the group by encouraging the patient to take a proactive approach to accomplishing their recovery goals.  Ardelle Park O 01/16/2012, 2:12 PM
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
## 2012-01-16 NOTE — Progress Notes (Signed)
Patient ID: [name], female   DOB: [birth year], 36 y.o.   MRN: [id number] She has been up and to groups interacting with peers and staff. Self inventory : Depression 3, hopelessness 10, w/d's craving chilling, tremors,  SI denies, and pain 5. Did not request pain medication.  On inventory sheet it indicated that she wants to go to a half way house on discharge.

## 2012-01-16 NOTE — Progress Notes (Signed)
[facility] INPATIENT:  Family/Significant Other Suicide Prevention Education  Suicide Prevention Education:  Education Completed; Britt Bolognese - mother [phone]),  (name of family member/significant other) has been identified by the patient as the family member/significant other with whom the patient will be residing, and identified as the person(s) who will aid the patient in the event of a mental health crisis (suicidal ideations/suicide attempt).  With written consent from the patient, the family member/significant other has been provided the following suicide prevention education, prior to the and/or following the discharge of the patient.  The suicide prevention education provided includes the following:  Suicide risk factors  Suicide prevention and interventions  National Suicide Hotline telephone number  [facility] assessment telephone number  [facility] Emergency Assistance 911  [facility] and/or Residential Mobile Crisis Unit telephone number  Request made of family/significant other to:  Remove weapons (e.g., guns, rifles, knives), all items previously/currently identified as safety concern.    Remove drugs/medications (over-the-counter, prescriptions, illicit drugs), all items previously/currently identified as a safety concern.  The family member/significant other verbalizes understanding of the suicide prevention education information provided.  The family member/significant other agrees to remove the items of safety concern listed above.  Ms. Amador Cunas is concerned about pt's alcohol use and feels pt needs to be on a medication to prevent drinking or go to rehab.    Carmina Miller 01/16/2012, 3:18 PM
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
## 2012-01-16 NOTE — Progress Notes (Signed)
[facility] Group Notes:  (Counselor/Nursing/MHT/Case Management/Adjunct)  01/16/2012 3:41 PM  Type of Therapy:  Psychoeducational Skills  Participation Level:  Did Not Attend   Summary of Progress/Problems: Issabela did not attend psychoeducation group that focused on using quality time with support systems/individuals to engage in healthy coping skills.    Wandra Scot 01/16/2012, 3:41 PM

## 2012-01-16 NOTE — Progress Notes (Signed)
Adult Psychosocial Assessment Update Interdisciplinary Team  [facility] admissions[facility]discharges:  Admissions Discharges  Date: 09/10/11 Date: 09/13/11  Date: Date:  Date: Date:  Date: Date:  Date: Date:   Changes since the last Psychosocial Assessment (including adherence to outpatient mental health and/or substance abuse treatment, situational issues contributing to decompensation and/or relapse). Pt states that when she was last here in July, she was admitted for a suicide attempt.  Pt states that this is the first time coming to the hospital for alcohol detox.  Pt states that she relapsed 6 months ago and has been drinking heavily since.  Pt states that situational issues are that she recently lost her house and her son has to stay with her mom. Pt states that her follow up plans on last admission was [facility].  Pt states that she missed her appointment and never followed up.               Discharge Plan 1. Will you be returning to the same living situation after discharge?   Yes: X No:      If no, what is your plan?           2. Would you like a referral for services when you are discharged? Yes:   X   If yes, for what services? Pt states that she can follow up with [facility] outpatient and would also like to look into [facility].  Pt explained that she went there a year ago and completed the program, and was able to stay sober for awhile.    No:              Summary and Recommendations (to be completed by the evaluator) Patient is a 36 year old Caucasian Female with a diagnosis of Alcohol Dependence.  Patient lives in [place] with family.  Patient will benefit from crisis stabilization, medication evaluation, group therapy and psycho education in addition to case management for discharge planning.                         Signature:  [name], 01/16/2012 8:59 AM

## 2012-01-16 NOTE — Clinical Social Work Note (Addendum)
Aftercare Planning Group: 01/16/2012 9:45 AM  Pt attended discharge planning group and actively participated in group.  CSW provided pt with today's workbook.  Pt presents with flat affect and depressed mood.  Pt rates depression and anxiety at a 7 today.  Pt denies SI/HI.  Pt states that she came to the hospital for alcohol detox.  Pt explained that she was here a few months ago but reports it was for a suicide attempt.  Pt states that she recently lost her house and has been staying with her mom in [place].  Pt states that she recently got a job but is concerned she will lose it being here.  Pt states that she went to [facility] a year ago and reports this helped her stay sober.  Pt wants to look into returning to [facility].  CSW will provide pt with the phone number there.  Pt also requesting a letter to provide her mom to take to work for her.  CSW will assess for appropriate referrals.  No further needs voiced by pt at this time.    [facility] Group Note : Clinical Social Worker Group Therapy  01/16/2012  1:15 PM  Type of Therapy:  Group Therapy - Process Group    Participation Level:  Patient did not attend group    Chelsea Horton, LCSWA 01/16/2012 3:00 pm

## 2012-01-16 NOTE — Progress Notes (Signed)
D:Pt has been asleep in her room before dinner. After dinner she has been in the dayroom interacting with staff and peers.   A:Gave medications as ordered. Offered support and 15 minute checks. R:Safety maintained on the unit.

## 2012-01-17 DIAGNOSIS — F1010 Alcohol abuse, uncomplicated: Secondary | ICD-10-CM

## 2012-01-17 MED ORDER — GABAPENTIN 100 MG PO CAPS
200.0000 mg | ORAL_CAPSULE | Freq: Three times a day (TID) | ORAL | Status: DC
  Administered 2012-01-17 – 2012-01-19 (×5): 200 mg via ORAL
  Filled 2012-01-17: qty 84
  Filled 2012-01-17 (×6): qty 2
  Filled 2012-01-17: qty 84
  Filled 2012-01-17 (×2): qty 2
  Filled 2012-01-17: qty 84

## 2012-01-17 MED ORDER — NABUMETONE 750 MG PO TABS
750.0000 mg | ORAL_TABLET | Freq: Two times a day (BID) | ORAL | Status: DC
  Administered 2012-01-17 – 2012-01-19 (×4): 750 mg via ORAL
  Filled 2012-01-17 (×8): qty 1

## 2012-01-17 NOTE — Progress Notes (Signed)
Pt reports she is feeling better this evening and is having minimal withdrawal symptoms at this time.  She says she spent most of the morning in the bed trying to "catch up on her sleep".  She lives with mother and feels that relationship is strained.  Her mother wants her to go to a rehab program, but pt wants to return home and go to IOP so she can be with her 37 yo son, especially around the holidays.  She was on her way to group when this Clinical research associate engaged her in conversation.  She brightens in conversation.  Encouraged pt to make her needs known to staff.  Pt voices no needs/concerns at this time.  Encouragement/support given.  Safety maintained with q15 minute checks.

## 2012-01-17 NOTE — Progress Notes (Signed)
Did not attend 11 am group 

## 2012-01-17 NOTE — Progress Notes (Signed)
[facility] MD Progress Note  01/17/2012 1:47 PM [name]  MRN:  [id number]  Diagnosis:   Axis I: Alcohol Abuse and Major Depression, single episode Axis II: Deferred Axis III:  Past Medical History  Diagnosis Date  . COPD (chronic obstructive pulmonary disease)   . Asthma   . Depression   . Tobacco abuse   . Alcohol abuse   . Anemia 01/31/2011  . Hypoxia 02/01/2011  . Needs sleep apnea assessment 2013.07.31   Axis IV: economic problems, other psychosocial or environmental problems, problems related to social environment and problems with primary support group Axis V: 41-50 serious symptoms  ADL's:  Intact  Sleep: Poor  Appetite:  Fair  Suicidal Ideation:  Denies Homicidal Ideation:  Denies  Mental Status Examination/Evaluation: Objective:  Appearance: Casual  Eye Contact::  Good  Speech:  Normal Rate  Volume:  Normal  Mood:  Depressed  Affect:  Depressed  Thought Process:  Logical  Orientation:  Full  Thought Content:  WDL  Suicidal Thoughts:  No  Homicidal Thoughts:  No  Memory:  Immediate;   Fair Recent;   Fair Remote;   Fair  Judgement:  Fair  Insight:  Fair  Psychomotor Activity:  Decreased  Concentration:  Fair  Recall:  Fair  Akathisia:  No  Handed:  Right  AIMS (if indicated):     Assets:  Communication Skills Desire for Improvement Social Support  Sleep:  Number of Hours: 6.25    Vital Signs:Blood pressure 107/66, pulse 103, temperature 97.4 F (36.3 C), temperature source Oral, resp. rate 20, height 5\' 5"  (1.651 m), weight 89.812 kg (198 lb), last menstrual period 01/01/2012. Current Medications: Current Facility-Administered Medications  Medication Dose Route Frequency Provider Last Rate Last Dose  . acetaminophen (TYLENOL) tablet 650 mg  650 mg Oral Q6H PRN Kerry Hough, PA      . albuterol (PROVENTIL HFA;VENTOLIN HFA) 108 (90 BASE) MCG/ACT inhaler 2 puff  2 puff Inhalation Q6H PRN Kerry Hough, PA   2 puff at 01/16/12 2201  .  albuterol (PROVENTIL) (5 MG/ML) 0.5% nebulizer solution 2.5 mg  2.5 mg Nebulization Q6H PRN Kerry Hough, PA      . alum & mag hydroxide-simeth (MAALOX/MYLANTA) 200-200-20 MG/5ML suspension 30 mL  30 mL Oral Q4H PRN Kerry Hough, PA      . chlordiazePOXIDE (LIBRIUM) capsule 25 mg  25 mg Oral Q6H PRN Kerry Hough, PA      . [COMPLETED] chlordiazePOXIDE (LIBRIUM) capsule 25 mg  25 mg Oral QID Rachael Fee, MD   25 mg at 01/16/12 2159   Followed by  . chlordiazePOXIDE (LIBRIUM) capsule 25 mg  25 mg Oral TID Rachael Fee, MD   25 mg at 01/17/12 1202   Followed by  . chlordiazePOXIDE (LIBRIUM) capsule 25 mg  25 mg Oral BH-qamhs Rachael Fee, MD       Followed by  . chlordiazePOXIDE (LIBRIUM) capsule 25 mg  25 mg Oral Daily Rachael Fee, MD      . cyclobenzaprine (FLEXERIL) tablet 5 mg  5 mg Oral TID Rachael Fee, MD   5 mg at 01/17/12 1202  . FLUoxetine (PROZAC) capsule 40 mg  40 mg Oral QHS Rachael Fee, MD   40 mg at 01/16/12 2158  . gabapentin (NEURONTIN) capsule 200 mg  200 mg Oral TID Nanine Means, NP      . hydrOXYzine (ATARAX/VISTARIL) tablet 25 mg  25 mg Oral Q6H PRN  Kerry Hough, PA      . loperamide (IMODIUM) capsule 2-4 mg  2-4 mg Oral PRN Kerry Hough, PA      . magnesium hydroxide (MILK OF MAGNESIA) suspension 30 mL  30 mL Oral Daily PRN Kerry Hough, PA      . mometasone-formoterol (DULERA) 100-5 MCG/ACT inhaler 2 puff  2 puff Inhalation BID Kerry Hough, PA   2 puff at 01/17/12 [phone]  . multivitamin with minerals tablet 1 tablet  1 tablet Oral Daily Kerry Hough, PA   1 tablet at 01/17/12 9147  . nabumetone (RELAFEN) tablet 750 mg  750 mg Oral BID Nanine Means, NP      . nicotine (NICODERM CQ - dosed in mg/24 hours) patch 21 mg  21 mg Transdermal Q0600 Kerry Hough, PA   21 mg at 01/17/12 0600  . ondansetron (ZOFRAN-ODT) disintegrating tablet 4 mg  4 mg Oral Q6H PRN Kerry Hough, PA      . thiamine (VITAMIN B-1) tablet 100 mg  100 mg Oral Daily  Kerry Hough, PA   100 mg at 01/17/12 0835  . traZODone (DESYREL) tablet 50 mg  50 mg Oral QHS,MR X 1 Kerry Hough, PA   50 mg at 01/16/12 2304  . [DISCONTINUED] gabapentin (NEURONTIN) capsule 100 mg  100 mg Oral TID Rachael Fee, MD   100 mg at 01/17/12 1202    Lab Results: No results found for this or any previous visit (from the past 48 hour(s)).  Physical Findings: AIMS: Facial and Oral Movements Muscles of Facial Expression: None, normal Lips and Perioral Area: None, normal Jaw: None, normal Tongue: None, normal,Extremity Movements Upper (arms, wrists, hands, fingers): None, normal Lower (legs, knees, ankles, toes): None, normal, Trunk Movements Neck, shoulders, hips: None, normal, Overall Severity Severity of abnormal movements (highest score from questions above): None, normal Incapacitation due to abnormal movements: None, normal Patient's awareness of abnormal movements (rate only patient's report): No Awareness, Dental Status Current problems with teeth and/or dentures?: No Does patient usually wear dentures?: Yes  CIWA:  CIWA-Ar Total: 1  COWS:     Treatment Plan Summary: Daily contact with patient to assess and evaluate symptoms and progress in treatment Medication management  Plan:  Devony presents with a depressed affect and a depression 1/10, misses her 37 yo son, reports good appetite, poor sleep due to back pain--Relafen added and neurotonin increased to help with her pain, patient stayed in the bed most on the morning "catching up" on her sleep, has an AA sponsor and a group she goes to, mother and son supportive, denies suicidal/homicidal ideations and auditory/visual hallucinations, monitoring continues.  Nanine Means, PMH-NP 01/17/2012, 1:47 PM

## 2012-01-17 NOTE — Progress Notes (Signed)
[facility] LCSW Aftercare Discharge Planning Group Note  01/17/2012 8:45 AM  Participation Quality:  Appropriate, Attentive and Resistant  Affect:  Anxious, Appropriate, Depressed and Flat  Cognitive:  Alert and Appropriate  Insight:  Developing/Improving  Engagement in Group:  Engaged  Modes of Intervention:  Clarification, Discussion, Education, Exploration, Orientation, Problem-solving, Rapport Building, Socialization and Support  Summary of Progress/Problems: Pt attended discharge planning group and actively participated in group.  CSW provided pt with today's workbook.  Pt presents with flat affect and depressed mood.  Pt rates depression at a 3 and anxiety at a 5 today.  Pt denies SI/HI.  Pt states that she doesn't feel well today.  Pt states that her mom wants her to go to rehab but pt wants to do outpatient to be back with her son and go back to work.  CSW will refer pt to [facility] when pt is stable.  No further needs voiced by pt at this time.    Reyes Ivan, LCSWA 01/17/2012 10:07 AM

## 2012-01-17 NOTE — Clinical Social Work Note (Signed)
[facility] LCSW Group Therapy  01/17/2012  1:15 PM   Type of Therapy:  Group Therapy  Participation Level:  Active  Participation Quality:  Appropriate, Attentive, Sharing and Supportive  Affect:  Appropriate  Cognitive:  Alert and Appropriate  Insight:  Engaged  Engagement in Therapy:  Engaged  Modes of Intervention:  Clarification, Discussion, Education, Exploration, Problem-solving, Rapport Building, Socialization and Support  Summary of Progress/Problems: The topic for group today was emotional regulation.  Pt participated in the discussion regarding what emotional regulation is and how it affects their life, positive and negative.  Pt discussed coping skills and ways they can regulate their emotions in a positive manner.   Pt discussed that she can't get a balance on her emotions and is either up or down.  Pt related to peers regarding people knowing how to push her buttons.  Pt states that her motivation is her son.    Reyes Ivan, LCSWA 01/17/2012 2:17 PM

## 2012-01-17 NOTE — Progress Notes (Signed)
Patient ID: [name], female   DOB: 1975/10/14, 36 y.o.   MRN: [id number] She has been to one group thus far today, has been in bed sleeping remainder to time. Self inventory: depression 5, hopelessness 6, denies withdrawal symptoms and SI thoughts.

## 2012-01-17 NOTE — Progress Notes (Signed)
D: Pt in bed resting with eyes closed. Respirations even and unlabored. Pt appears to be in no signs of distress at this time. A: Q15min checks remains for this pt. R: Pt remains safe at this time.   

## 2012-01-18 MED ORDER — ARIPIPRAZOLE 2 MG PO TABS
2.0000 mg | ORAL_TABLET | Freq: Every day | ORAL | Status: DC
  Administered 2012-01-18 – 2012-01-19 (×3): 2 mg via ORAL
  Filled 2012-01-18 (×2): qty 1
  Filled 2012-01-18: qty 14
  Filled 2012-01-18 (×2): qty 1

## 2012-01-18 NOTE — Progress Notes (Signed)
[facility] MD Progress Note  01/18/2012 12:40 PM SHARRELL KRAWIEC  MRN:  [id number] Subjective:  [name] is still experiencing some symptoms, but overall feels better. She is going to stay with her mother and her 37 Y/o son. Her mother has severe arthritis and she needs help. She was planning to go to a half way house, but realized that she cant let her mother alone taking care of her son. She feels she can create a safe environment for herself. She is planning to finish her GED before it changes the end of the year. She likes her job as she feels it is easy and will allow her to study for her GED Diagnosis:   Axis I: Alcohol dependence, alcohol withdrawal, Major Depression recurrent Axis II: Deferred Axis III:  Past Medical History  Diagnosis Date  . COPD (chronic obstructive pulmonary disease)   . Asthma   . Depression   . Tobacco abuse   . Alcohol abuse   . Anemia 01/31/2011  . Hypoxia 02/01/2011  . Needs sleep apnea assessment 2013.07.31   Axis IV: economic problems and educational problems Axis V: 51-60 moderate symptoms  ADL's:  Intact  Sleep: Fair  Appetite:  Fair  Suicidal Ideation:  Plan:  Denies Homicidal Ideation:  Plan:  Denies Intent:  Denies Means:  Denies AEB (as evidenced by):  Psychiatric Specialty Exam: Review of Systems  Constitutional: Negative.   HENT: Negative.   Eyes: Negative.   Respiratory: Negative.   Cardiovascular: Negative.   Gastrointestinal: Negative.   Genitourinary: Negative.   Musculoskeletal: Negative.   Skin: Negative.   Neurological: Negative.   Endo/Heme/Allergies: Negative.   Psychiatric/Behavioral: Positive for depression and substance abuse. The patient is nervous/anxious.     Blood pressure 91/56, pulse 90, temperature 97.3 F (36.3 C), temperature source Oral, resp. rate 18, height 5\' 5"  (1.651 m), weight 89.812 kg (198 lb), last menstrual period 01/01/2012, SpO2 96.00%.Body mass index is 32.95 kg/(m^2).  General  Appearance: Fairly Groomed  Patent attorney::  Fair  Speech:  Clear and Coherent and Normal Rate  Volume:  Decreased  Mood:  Anxious and Depressed  Affect:  Restricted  Thought Process:  Goal Directed and Logical  Orientation:  Full (Time, Place, and Person)  Thought Content:  WDL  Suicidal Thoughts:  No  Homicidal Thoughts:  No  Memory:  Immediate;   Fair Recent;   Fair Remote;   Fair  Judgement:  Fair  Insight:  Present  Psychomotor Activity:  Normal  Concentration:  Fair  Recall:  Fair  Akathisia:  No  Handed:  Right  AIMS (if indicated):     Assets:  Desire for Improvement Talents/Skills  Sleep:  Number of Hours: 6    Current Medications: Current Facility-Administered Medications  Medication Dose Route Frequency Provider Last Rate Last Dose  . acetaminophen (TYLENOL) tablet 650 mg  650 mg Oral Q6H PRN Kerry Hough, PA      . albuterol (PROVENTIL HFA;VENTOLIN HFA) 108 (90 BASE) MCG/ACT inhaler 2 puff  2 puff Inhalation Q6H PRN Kerry Hough, PA   2 puff at 01/18/12 [phone]  . albuterol (PROVENTIL) (5 MG/ML) 0.5% nebulizer solution 2.5 mg  2.5 mg Nebulization Q6H PRN Kerry Hough, PA      . alum & mag hydroxide-simeth (MAALOX/MYLANTA) 200-200-20 MG/5ML suspension 30 mL  30 mL Oral Q4H PRN Kerry Hough, PA      . ARIPiprazole (ABILIFY) tablet 2 mg  2 mg Oral Daily Rachael Fee, MD  2 mg at 01/18/12 1210  . chlordiazePOXIDE (LIBRIUM) capsule 25 mg  25 mg Oral Q6H PRN Kerry Hough, PA      . [COMPLETED] chlordiazePOXIDE (LIBRIUM) capsule 25 mg  25 mg Oral TID Rachael Fee, MD   25 mg at 01/17/12 1710   Followed by  . chlordiazePOXIDE (LIBRIUM) capsule 25 mg  25 mg Oral BH-qamhs Rachael Fee, MD   25 mg at 01/18/12 0800   Followed by  . chlordiazePOXIDE (LIBRIUM) capsule 25 mg  25 mg Oral Daily Rachael Fee, MD      . cyclobenzaprine (FLEXERIL) tablet 5 mg  5 mg Oral TID Rachael Fee, MD   5 mg at 01/18/12 1210  . FLUoxetine (PROZAC) capsule 40 mg  40 mg Oral QHS  Rachael Fee, MD   40 mg at 01/17/12 2159  . gabapentin (NEURONTIN) capsule 200 mg  200 mg Oral TID Nanine Means, NP   200 mg at 01/18/12 1209  . hydrOXYzine (ATARAX/VISTARIL) tablet 25 mg  25 mg Oral Q6H PRN Kerry Hough, PA      . loperamide (IMODIUM) capsule 2-4 mg  2-4 mg Oral PRN Kerry Hough, PA      . magnesium hydroxide (MILK OF MAGNESIA) suspension 30 mL  30 mL Oral Daily PRN Kerry Hough, PA      . mometasone-formoterol (DULERA) 100-5 MCG/ACT inhaler 2 puff  2 puff Inhalation BID Kerry Hough, PA   2 puff at 01/18/12 0758  . multivitamin with minerals tablet 1 tablet  1 tablet Oral Daily Kerry Hough, PA   1 tablet at 01/18/12 0759  . nabumetone (RELAFEN) tablet 750 mg  750 mg Oral BID Nanine Means, NP   750 mg at 01/18/12 0759  . nicotine (NICODERM CQ - dosed in mg/24 hours) patch 21 mg  21 mg Transdermal Q0600 Kerry Hough, PA   21 mg at 01/18/12 0616  . ondansetron (ZOFRAN-ODT) disintegrating tablet 4 mg  4 mg Oral Q6H PRN Kerry Hough, PA      . thiamine (VITAMIN B-1) tablet 100 mg  100 mg Oral Daily Kerry Hough, PA   100 mg at 01/18/12 0759  . traZODone (DESYREL) tablet 50 mg  50 mg Oral QHS,MR X 1 Kerry Hough, PA   50 mg at 01/17/12 2303  . [DISCONTINUED] gabapentin (NEURONTIN) capsule 100 mg  100 mg Oral TID Rachael Fee, MD   100 mg at 01/17/12 1202    Lab Results: No results found for this or any previous visit (from the past 48 hour(s)).  Physical Findings: AIMS: Facial and Oral Movements Muscles of Facial Expression: None, normal Lips and Perioral Area: None, normal Jaw: None, normal Tongue: None, normal,Extremity Movements Upper (arms, wrists, hands, fingers): None, normal Lower (legs, knees, ankles, toes): None, normal, Trunk Movements Neck, shoulders, hips: None, normal, Overall Severity Severity of abnormal movements (highest score from questions above): None, normal Incapacitation due to abnormal movements: None, normal Patient's  awareness of abnormal movements (rate only patient's report): No Awareness, Dental Status Current problems with teeth and/or dentures?: No Does patient usually wear dentures?: Yes  CIWA:  CIWA-Ar Total: 1  COWS:     Treatment Plan Summary: Daily contact with patient to assess and evaluate symptoms and progress in treatment Medication management  Plan: Continue Prozac 40 mg daily, Add Abilify 2 mg to augment. She states that Prozac was not as working at well as it did  on the beginning Relapse prevention  Medical Decision Making Problem Points:  Established problem, stable/improving (1) and Review of psycho-social stressors (1) Data Points:  Review of medication regiment & side effects (2) Review of new medications or change in dosage (2)  I certify that inpatient services furnished can reasonably be expected to improve the patient's condition.   Quamaine Webb A 01/18/2012, 12:40 PM

## 2012-01-18 NOTE — Progress Notes (Signed)
Psychoeducational Group Note  Date:  01/18/2012 Time:  2000   Group Topic/Focus:  Karaoke Group  Participation Level:  Active  Participation Quality:  Appropriate, Attentive and Supportive  Affect:  Blunted  Cognitive:  Alert and Appropriate  Insight:  Improving  Engagement in Group:  Engaged  Additional Comments:    Humberto Seals Monique 01/18/2012, 10:58 PM

## 2012-01-18 NOTE — Clinical Social Work Note (Signed)
[facility] LCSW Aftercare Discharge Planning Group Note  01/18/2012 8:45 AM  Participation Quality:  Appropriate, Attentive, Sharing and Supportive  Affect:  Appropriate  Cognitive:  Alert and Appropriate  Insight:  Engaged  Engagement in Group:  Engaged  Modes of Intervention:  Clarification, Discussion, Education, Exploration, Orientation, Problem-solving, Rapport Building, Socialization and Support  Summary of Progress/Problems: Pt attended discharge planning group and actively participated in group.  CSW provided pt with today's workbook.  Pt presents with flat affect and depressed mood.  Pt rates depression and anxiety at a 2-3 today.  Pt denies SI/HI.  Pt states that she is feeling better and wants to d/c before Saturday to get to work.  Pt will follow up at [facility] for medication management and therapy.  No further needs voiced by pt at this time.    [name], LCSWA 01/18/2012 9:14 AM

## 2012-01-19 MED ORDER — TRAZODONE HCL 50 MG PO TABS: 50.0000 mg | ORAL_TABLET | Freq: Every evening | ORAL | Status: DC | PRN

## 2012-01-19 MED ORDER — FLUOXETINE HCL 40 MG PO CAPS: 40.0000 mg | ORAL_CAPSULE | Freq: Every day | ORAL | Status: DC

## 2012-01-19 MED ORDER — ARIPIPRAZOLE 2 MG PO TABS: 2.0000 mg | ORAL_TABLET | Freq: Every day | ORAL | Status: DC

## 2012-01-19 MED ORDER — CYCLOBENZAPRINE HCL 10 MG PO TABS: 10.0000 mg | ORAL_TABLET | Freq: Three times a day (TID) | ORAL | Status: DC

## 2012-01-19 MED ORDER — CYCLOBENZAPRINE HCL 10 MG PO TABS
10.0000 mg | ORAL_TABLET | Freq: Three times a day (TID) | ORAL | Status: DC
  Filled 2012-01-19: qty 1
  Filled 2012-01-19: qty 42
  Filled 2012-01-19: qty 1
  Filled 2012-01-19: qty 42
  Filled 2012-01-19: qty 1
  Filled 2012-01-19: qty 42

## 2012-01-19 MED ORDER — GABAPENTIN 100 MG PO CAPS: 200.0000 mg | ORAL_CAPSULE | Freq: Three times a day (TID) | ORAL | Status: DC

## 2012-01-19 NOTE — Progress Notes (Signed)
[facility] Group Notes:  (Counselor/Nursing/MHT/Case Management/Adjunct)  01/19/2012 10:59 AM  Type of Therapy:  Psychoeducational Skills  Participation Level:  Did Not Attend  Dalia Heading 01/19/2012, 10:59 AM

## 2012-01-19 NOTE — Tx Team (Signed)
Interdisciplinary Treatment Plan Update (Adult)  Date:  01/19/2012  Time Reviewed:  9:43 AM   Progress in Treatment: Attending groups: Yes Participating in groups:  Yes Taking medication as prescribed: Yes Tolerating medication:  Yes Family/Significant othe contact made:  Yes Patient understands diagnosis:  Yes Discussing patient identified problems/goals with staff:  Yes Medical problems stabilized or resolved:  Yes Denies suicidal/homicidal ideation: Yes Issues/concerns per patient self-inventory:  None identified Other: N/A  New problem(s) identified: None Identified  Reason for Continuation of Hospitalization: Stable to d/c  Interventions implemented related to continuation of hospitalization: Stable to d/c  Additional comments: N/A  Estimated length of stay: D/C today  Discharge Plan: Pt will follow up with [facility] for medication management and therapy.    New goal(s): N/A  Review of initial/current patient goals per problem list:    1.  Goal(s): Address substance use by completing detox protocol  Met:  Yes  Target date: today   As evidenced by: Pt completed detox protocol on 12/6  2.  Goal (s): Reduce depressive symptoms by reducing from a 10 to a 3  Met:  Yes  Target date: today  As evidenced by: Pt rates at a 2 today.     3.  Goal (s): Reduce anxiety symptoms by reducing from a 10 to a 3  Met:  Yes  Target date: today  As evidenced by: Pt rates at a 2 today.    4.  Goal(s): Eliminate SI  Met:  Yes  Target date: by discharge  As evidenced by: Pt denies SI.    Attendees: Patient:   01/19/2012 9:43 AM   Family:     Physician:  Geoffery Lyons, MD 01/19/2012 9:43 AM   Nursing: Alease Frame, RN 01/19/2012 9:43 AM   Clinical Social Worker:  Reyes Ivan, LCSWA 01/19/2012 9:43 AM   Other: Seabron Spates, RN 01/19/2012 9:43 AM   Other:  Verna Czech, LCSW 01/19/2012 9:45 AM   Other:     Other:     Other:      Scribe for Treatment Team:    Reyes Ivan 01/19/2012 9:43 AM

## 2012-01-19 NOTE — Progress Notes (Signed)
[facility] LCSW Group Therapy  01/19/2012 5:05 PM  Type of Therapy:  Group Therapy  Participation Level:  Active  Participation Quality:  Attentive, Sharing and Supportive  Affect:  Blunted  Cognitive:  Alert  Insight:  Engaged  Engagement in Therapy:  Engaged  Modes of Intervention:  Discussion, Exploration, Problem-solving and Support  Summary of Progress/Problems: The topic for group was balance in life.  Pt participated in the discussion about when their life was in balance and out of balance and how this feels.  Pt discussed ways to get back in balance and short term goals they can work on to get where they want to be. Patient discussed the importance of taking care of herself and not becoming complacent as a way to to remain balanced.  Verna Czech Embden 01/19/2012, 5:05 PM

## 2012-01-19 NOTE — Progress Notes (Signed)
[facility] Adult Case Management Discharge Plan :  Will you be returning to the same living situation after discharge: Yes,  returning home At discharge, do you have transportation home?:Yes,  access to transportation Do you have the ability to pay for your medications:Yes,  access to meds  Release of information consent forms completed and in the chart;  Patient's signature needed at discharge.  Patient to Follow up at: Follow-up Information    Follow up with [facility]. On 01/23/2012. (Appointment scheduled at 8:30 am, referral [phone])    Contact information:   [address] [place], [place] [address] [phone]         Patient denies SI/HI:   Yes,  denies SI/HI    Safety Planning and Suicide Prevention discussed:  Yes,  discussed with pt today  No recommendations from CSW.  No further needs voiced by pt.  Pt stable to discharge.    Carmina Miller 01/19/2012, 9:11 AM

## 2012-01-19 NOTE — Clinical Social Work Note (Signed)
[facility] LCSW Aftercare Discharge Planning Group Note  01/19/2012 8:45 AM  Participation Quality:  Appropriate and Attentive  Affect:  Appropriate  Cognitive:  Alert and Appropriate  Insight:  Engaged  Engagement in Group:  Engaged  Modes of Intervention:  Clarification, Discussion, Education, Exploration, Orientation, Problem-solving, Rapport Building, Socialization and Support  Summary of Progress/Problems: Pt attended discharge planning group and actively participated in group.  CSW provided pt with today's workbook.  Pt presents with calm mood and affect.  Pt rates depression and anxiety at a 2 today.  Pt denies SI/HI.  Pt reports feeling stable to d/c today.  Pt has follow up scheduled at [facility] for medication management and therapy.  Pt discussed using her AA group as support and reports having a sponsor.  No further needs voiced by pt at this time.  Safety planning and suicide prevention discussed.  Pt participated in discussion and acknowledged an understanding of the information provided.       Reyes Ivan, LCSWA 01/19/2012 9:06 AM

## 2012-01-19 NOTE — Progress Notes (Signed)
DISCHARGE NOTE:  Discharged from hospital w/scripts, samples of meds, instructions and appointments. Denies Si or HI, mother is here to pick her up, she is happy about leaving, taking meds, eating well and having no complaints at  This time.

## 2012-01-19 NOTE — Progress Notes (Signed)
Agree with assessment and plan Maggie Senseney A. Candies Palm, [name] 

## 2012-01-19 NOTE — Clinical Social Work Note (Signed)
[facility] LCSW Group Therapy  01/19/2012  1:15 PM   Type of Therapy:  Group Therapy  Participation Level:  Active  Participation Quality:  Appropriate and Attentive  Affect:  Appropriate and Depressed  Cognitive:  Alert and Appropriate  Insight:  Developing/Improving  Engagement in Therapy:  Developing/Improving  Modes of Intervention:  Clarification, Confrontation, Discussion, Exploration, Problem-solving, Rapport Building, Socialization and Support  Summary of Progress/Problems: The topic for today was feelings about relapse.  Pt discussed what relapse prevention is to them and identified triggers that they are on the path to relapse.  Pt processed their feeling towards relapse and was able to relate to peers.  Pt discussed coping skills that can be used for relapse prevention.   Pt discussed changing people, places and things to prevent relapse for herself.    [name], LCSWA 01/19/2012 1:57 PM

## 2012-01-19 NOTE — Discharge Summary (Signed)
Greater than 30 minutes.Greater than 30 minutes.Greater than 30 minutes.Greater than 30 minutes.Greater than 30 minutes.Greater than 30 minutes.Physician Discharge Summary Note  Patient:  [name] is an 36 y.o., female MRN:  [id number] DOB:  11/19/1975 Patient phone:  [phone] (home)  Patient address:   [address] [place] [address],   Date of Admission:  01/15/2012 Date of Discharge: 01/19/2012  Reason for Admission:  Relapsed using alcohol, was very depressed, endorsing suicidal ideas. Has broken up with girlfriend and was in an environment where alcohol was being used. She missed her outpatient appointment.  Discharge Diagnoses: Active Problems:  Alcohol dependence  Major depression  Alcohol withdrawal  Review of Systems  Constitutional: Negative.   HENT: Negative.   Eyes: Negative.   Respiratory: Negative.   Cardiovascular: Negative.   Gastrointestinal: Negative.   Genitourinary: Negative.   Musculoskeletal: Positive for back pain.  Skin: Negative.   Neurological: Negative.   Endo/Heme/Allergies: Negative.   Psychiatric/Behavioral: Positive for depression and substance abuse.   Axis Diagnosis:   AXIS I:  Alcohol Dependence, S/P alcohol withdrawal, Major Depression AXIS II:  Deferred AXIS III:   Past Medical History  Diagnosis Date  . COPD (chronic obstructive pulmonary disease)   . Asthma   . Depression   . Tobacco abuse   . Alcohol abuse   . Anemia 01/31/2011  . Hypoxia 02/01/2011  . Needs sleep apnea assessment 2013.07.31   AXIS IV:  None identified at this time AXIS V:  61-70 mild symptoms  Level of Care:  OP  Hospital Course:  She was admitted, started in intensive individual and group psychotherapy. She was detox with librium. She was kept on Prozac. As she stated that the Prozac had not been working as well anymore, she was started on Abilify. She was also given Neurontin with the hope it could help with pain as well as anxiety, and  mood. She had stated that she was experiencing a lot of irritability she was projecting on people. She responded well to treatment. Upon discharge, she is planning to stay with her mother and so.  Consults:  None  Significant Diagnostic Studies:  labs: CBC, CMET WNL, BAL 128, UDS was neg  Discharge Vitals:   Blood pressure 83/56, pulse 105, temperature 98.5 F (36.9 C), temperature source Oral, resp. rate 20, height 5\' 5"  (1.651 m), weight 89.812 kg (198 lb), last menstrual period 01/01/2012, SpO2 96.00%. Body mass index is 32.95 kg/(m^2). Lab Results:   No results found for this or any previous visit (from the past 72 hour(s)).  Physical Findings: AIMS: Facial and Oral Movements Muscles of Facial Expression: None, normal Lips and Perioral Area: None, normal Jaw: None, normal Tongue: None, normal,Extremity Movements Upper (arms, wrists, hands, fingers): None, normal Lower (legs, knees, ankles, toes): None, normal, Trunk Movements Neck, shoulders, hips: None, normal, Overall Severity Severity of abnormal movements (highest score from questions above): None, normal Incapacitation due to abnormal movements: None, normal Patient's awareness of abnormal movements (rate only patient's report): No Awareness, Dental Status Current problems with teeth and/or dentures?: No Does patient usually wear dentures?: Yes  CIWA:  CIWA-Ar Total: 1  COWS:     Psychiatric Specialty Exam: See Psychiatric Specialty Exam and Suicide Risk Assessment completed by Attending Physician prior to discharge.  Discharge destination:  Home  Is patient on multiple antipsychotic therapies at discharge:  No   Has Patient had three or more failed trials of antipsychotic monotherapy by history:  No  Recommended Plan for  Multiple Antipsychotic Therapies: N/A      Medication List     As of 01/19/2012 10:26 AM    ASK your doctor about these medications      Indication    albuterol (2.5 MG/3ML) 0.083% nebulizer  solution   Commonly known as: PROVENTIL   Take 3 mLs (2.5 mg total) by nebulization every 6 (six) hours as needed for wheezing or shortness of breath. For asthma       albuterol 108 (90 BASE) MCG/ACT inhaler   Commonly known as: PROVENTIL HFA;VENTOLIN HFA   Inhale 2 puffs into the lungs every 6 (six) hours as needed for wheezing.       FLUoxetine 40 MG capsule   Commonly known as: PROZAC   Take 1 capsule (40 mg total) by mouth at bedtime. For depression.       HYDROcodone-acetaminophen 5-325 MG per tablet   Commonly known as: NORCO/VICODIN   Take 1 tablet by mouth every 6 (six) hours as needed. For pain       mometasone-formoterol 100-5 MCG/ACT Aero   Commonly known as: DULERA   Inhale 2 puffs into the lungs 2 (two) times daily. For asthma       traZODone 100 MG tablet   Commonly known as: DESYREL   Take 1 tablet (100 mg total) by mouth at bedtime. For insomnia.            Follow-up Information    Follow up with [facility] On 01/23/2012. (Appointment scheduled at 8:30 am, referral [phone])    Contact information:   [address] [place], [place] [address] [phone]         Follow-up recommendations:  Will go to [facility]. This time around she is committing to maintain her appointments, and take her medications. She is going to pursue AA  Comments:  She feels responsible for her mother who has arthritis, and her 48 Y/O son who has only her to count on. She will keep her job  Total Discharge Time:  Greater than 30 min  Signed: Jeremie Abdelaziz A 01/19/2012, 10:26 AM

## 2012-01-19 NOTE — [facility] Suicide Risk Assessment (Signed)
Suicide Risk Assessment  Discharge Assessment     Demographic Factors:  Unemployed  Mental Status Per Nursing Assessment::   On Admission:  NA  Current Mental Status by Physician: In full contact with reality. There are no suicidal ideas, plans or intent. She is fully detox. Her mood is euthymic. Her affect is appropriate. She is going to stay with her mother and her son. This will be a safe environment for her. She plans to continue to go to meetings, as well as go to [facility] for her follow up. She is committed to her recovery. She has worked on finding ways of being assertive with her family. The relationship with her partner is completely over, and she has accepted it, and moved on   Loss Factors: Loss of significant relationship  Historical Factors: NA  Risk Reduction Factors:   Responsible for children under 34 years of age, Sense of responsibility to family, Employed, Living with another person, especially a relative and Positive social support  Continued Clinical Symptoms:  Depression:   Comorbid alcohol abuse/dependence Alcohol/Substance Abuse/Dependencies  Cognitive Features That Contribute To Risk: None identified   Suicide Risk:  Minimal: No identifiable suicidal ideation.  Patients presenting with no risk factors but with morbid ruminations; may be classified as minimal risk based on the severity of the depressive symptoms  Discharge Diagnoses:   AXIS I:  Alcohol Dependence, S/P Alcohol withdrawal, Major Depression recurrent AXIS II:  Deferred AXIS III:   Past Medical History  Diagnosis Date  . COPD (chronic obstructive pulmonary disease)   . Asthma   . Depression   . Tobacco abuse   . Alcohol abuse   . Anemia 01/31/2011  . Hypoxia 02/01/2011  . Needs sleep apnea assessment 2013.07.31   AXIS IV:  None identified AXIS V:  61-70 mild symptoms  Plan Of Care/Follow-up recommendations:  Activity:  As tolerated Diet:  Regular Follow up with [facility];  continue to go to AA  Is patient on multiple antipsychotic therapies at discharge:  No   Has Patient had three or more failed trials of antipsychotic monotherapy by history:  No  Recommended Plan for Multiple Antipsychotic Therapies: N/A   Phat Dalton A 01/19/2012, 9:18 AM

## 2012-01-19 NOTE — Progress Notes (Signed)
Pt observed resting in bed with eyes closed. RR WNL, even and unlabored. Level III obs in place for safety. Pt is safe. Lawrence Marseilles

## 2012-01-23 NOTE — Progress Notes (Signed)
Patient Discharge Instructions:  After Visit Summary (AVS): Faxed to: 01/23/12  Psychiatric Admission Assessment Note: Faxed to: 01/23/12  Suicide Risk Assessment - Discharge Assessment: Faxed to: 01/23/12  Faxed/Sent to the Next Level Care provider: 01/23/12  Faxed to [facility] @ [phone]   [name], 01/23/2012, 4:38 PM

## 2012-01-23 NOTE — Progress Notes (Deleted)
Patient Discharge Instructions:  After Visit Summary (AVS):   Faxed to:  01/16/12 Psychiatric Admission Assessment Note:   Faxed to:  01/16/12 Suicide Risk Assessment - Discharge Assessment:   Faxed to:  01/16/12 Faxed/Sent to the Next Level Care provider:  01/16/12 Faxed to [facility] @ [phone]  [name], 01/23/2012, 4:02 PM

## 2012-01-25 LAB — DRUG SCREEN, URINE
Amphetamines, Ur Screen: NEGATIVE (ref ?–1000)
Barbiturates, Ur Screen: NEGATIVE (ref ?–200)
Benzodiazepine, Ur Scrn: POSITIVE (ref ?–200)
Cannabinoid 50 Ng, Ur ~~LOC~~: NEGATIVE (ref ?–50)
MDMA (Ecstasy)Ur Screen: NEGATIVE (ref ?–500)
Opiate, Ur Screen: NEGATIVE (ref ?–300)
Phencyclidine (PCP) Ur S: NEGATIVE (ref ?–25)
Tricyclic, Ur Screen: NEGATIVE (ref ?–1000)

## 2012-01-25 LAB — URINALYSIS, COMPLETE
Bilirubin,UR: NEGATIVE
Nitrite: NEGATIVE
Protein: NEGATIVE
Specific Gravity: 1.013 (ref 1.003–1.030)
Squamous Epithelial: 3

## 2012-01-25 LAB — COMPREHENSIVE METABOLIC PANEL
Alkaline Phosphatase: 114 U/L (ref 50–136)
Calcium, Total: 8.8 mg/dL (ref 8.5–10.1)
Chloride: 103 mmol/L (ref 98–107)
Co2: 24 mmol/L (ref 21–32)
EGFR (African American): >60
Potassium: 3.5 mmol/L (ref 3.5–5.1)
SGOT(AST): 55 U/L — ABNORMAL HIGH (ref 15–37)
SGPT (ALT): 67 U/L (ref 12–78)

## 2012-01-25 LAB — CBC
Platelet: 335 10*3/uL (ref 150–440)
RBC: 4.58 10*6/uL (ref 3.80–5.20)
RDW: 15.8 % — ABNORMAL HIGH (ref 11.5–14.5)

## 2012-01-25 LAB — TSH: Thyroid Stimulating Horm: 9.87 u[IU]/mL — ABNORMAL HIGH

## 2012-01-26 ENCOUNTER — Inpatient Hospital Stay: Payer: Self-pay | Admitting: Psychiatry

## 2012-01-27 LAB — TSH: Thyroid Stimulating Horm: 5.89 u[IU]/mL — ABNORMAL HIGH

## 2012-01-28 LAB — PREGNANCY, URINE: Pregnancy Test, Urine: NEGATIVE m[IU]/mL

## 2012-01-30 LAB — URINE CULTURE

## 2012-04-04 ENCOUNTER — Other Ambulatory Visit (HOSPITAL_COMMUNITY): Payer: Self-pay | Admitting: Physician Assistant

## 2012-06-17 ENCOUNTER — Other Ambulatory Visit (HOSPITAL_COMMUNITY): Payer: Self-pay | Admitting: Physician Assistant

## 2012-06-29 ENCOUNTER — Emergency Department (HOSPITAL_COMMUNITY)
Admission: EM | Admit: 2012-06-29 | Discharge: 2012-06-29 | Disposition: A | Discharge status: Home / Self Care | Payer: Self-pay | Attending: Emergency Medicine | Admitting: Emergency Medicine

## 2012-06-29 ENCOUNTER — Encounter (HOSPITAL_COMMUNITY): Payer: Self-pay

## 2012-06-29 ENCOUNTER — Emergency Department (HOSPITAL_COMMUNITY): Payer: Self-pay

## 2012-06-29 DIAGNOSIS — Z8669 Personal history of other diseases of the nervous system and sense organs: Secondary | ICD-10-CM | POA: Insufficient documentation

## 2012-06-29 DIAGNOSIS — Z79899 Other long term (current) drug therapy: Secondary | ICD-10-CM | POA: Insufficient documentation

## 2012-06-29 DIAGNOSIS — F3289 Other specified depressive episodes: Secondary | ICD-10-CM | POA: Insufficient documentation

## 2012-06-29 DIAGNOSIS — F329 Major depressive disorder, single episode, unspecified: Secondary | ICD-10-CM | POA: Insufficient documentation

## 2012-06-29 DIAGNOSIS — F411 Generalized anxiety disorder: Secondary | ICD-10-CM | POA: Insufficient documentation

## 2012-06-29 DIAGNOSIS — IMO0002 Reserved for concepts with insufficient information to code with codable children: Secondary | ICD-10-CM | POA: Insufficient documentation

## 2012-06-29 DIAGNOSIS — Z862 Personal history of diseases of the blood and blood-forming organs and certain disorders involving the immune mechanism: Secondary | ICD-10-CM | POA: Insufficient documentation

## 2012-06-29 DIAGNOSIS — J441 Chronic obstructive pulmonary disease with (acute) exacerbation: Secondary | ICD-10-CM | POA: Insufficient documentation

## 2012-06-29 DIAGNOSIS — Z87891 Personal history of nicotine dependence: Secondary | ICD-10-CM | POA: Insufficient documentation

## 2012-06-29 DIAGNOSIS — R0989 Other specified symptoms and signs involving the circulatory and respiratory systems: Secondary | ICD-10-CM | POA: Insufficient documentation

## 2012-06-29 DIAGNOSIS — R0609 Other forms of dyspnea: Secondary | ICD-10-CM | POA: Insufficient documentation

## 2012-06-29 DIAGNOSIS — J45901 Unspecified asthma with (acute) exacerbation: Secondary | ICD-10-CM

## 2012-06-29 MED ORDER — ALBUTEROL SULFATE HFA 108 (90 BASE) MCG/ACT IN AERS
2.0000 | INHALATION_SPRAY | RESPIRATORY_TRACT | Status: DC | PRN
  Administered 2012-06-29: 2 via RESPIRATORY_TRACT
  Filled 2012-06-29: qty 6.7

## 2012-06-29 MED ORDER — IPRATROPIUM BROMIDE 0.02 % IN SOLN
0.5000 mg | Freq: Once | RESPIRATORY_TRACT | Status: AC
  Administered 2012-06-29: 0.5 mg via RESPIRATORY_TRACT
  Filled 2012-06-29: qty 2.5

## 2012-06-29 MED ORDER — METHYLPREDNISOLONE SODIUM SUCC 125 MG IJ SOLR
125.0000 mg | Freq: Once | INTRAMUSCULAR | Status: AC
  Administered 2012-06-29: 125 mg via INTRAVENOUS
  Filled 2012-06-29: qty 2

## 2012-06-29 MED ORDER — ALBUTEROL SULFATE (5 MG/ML) 0.5% IN NEBU
5.0000 mg | INHALATION_SOLUTION | Freq: Once | RESPIRATORY_TRACT | Status: AC
  Administered 2012-06-29: 5 mg via RESPIRATORY_TRACT
  Filled 2012-06-29: qty 1

## 2012-06-29 MED ORDER — PREDNISONE 10 MG PO TABS: 20.0000 mg | ORAL_TABLET | Freq: Two times a day (BID) | ORAL | Status: DC

## 2012-06-29 MED ORDER — ALBUTEROL (5 MG/ML) CONTINUOUS INHALATION SOLN
10.0000 mg/h | INHALATION_SOLUTION | Freq: Once | RESPIRATORY_TRACT | Status: AC
  Administered 2012-06-29: 10 mg/h via RESPIRATORY_TRACT
  Filled 2012-06-29: qty 20

## 2012-06-29 NOTE — ED Notes (Signed)
Patient reports she feels as if she can go home.

## 2012-06-29 NOTE — ED Notes (Signed)
Patient still with expiratory wheezes after nebulizer treatment. MD notified and stated to give continuous nebulizer.

## 2012-06-29 NOTE — ED Provider Notes (Signed)
History  This chart was scribed for Geoffery Lyons, MD by Bennett Scrape, ED Scribe. This patient was seen in room APA14/APA14 and the patient's care was started at 8:35 PM.  CSN: [id number]  Arrival date & time 06/29/12  2007   First MD Initiated Contact with Patient 06/29/12 2035      Chief Complaint  Patient presents with  . Asthma  . Shortness of Breath     The history is provided by the patient. No language interpreter was used.   HPI Comments: [name] is a 37 y.o. female with a h/o COPD and asthma who presents to the Emergency Department complaining of gradual onset, gradually worsening, constant SOB which occurred earlier today after she had been outside. She states that she laid down to take a nap and woke up with her SOB at a more severe level. She admits that her symptoms are similar to prior asthma exacerbations. Pt has a nebulizer which she has not used in the past 3 months after she stopped smoking. She reports that she used to be on oxygen at night until she quit smoking 3 months ago. She denies being on oxygen currently. She denies CP, nausea and cough.    Past Medical History  Diagnosis Date  . COPD (chronic obstructive pulmonary disease)   . Asthma   . Depression   . Tobacco abuse   . Alcohol abuse   . Anemia 01/31/2011  . Hypoxia 02/01/2011  . Needs sleep apnea assessment 2013.07.31    Past Surgical History  Procedure Laterality Date  . Back surgery    . Cesarean section    . Dilation and curettage of uterus    . Ankle ganglion cyst excision      Family History  Problem Relation Age of Onset  . Hypothyroidism Mother   . Coronary artery disease Mother     History  Substance Use Topics  . Smoking status: Current Every Day Smoker -- 1.00 packs/day    Types: Cigarettes  . Smokeless tobacco: Not on file  . Alcohol Use: 0.0 oz/week    12-18 Cans of beer per week    No OB history provided.  Review of Systems  Constitutional: Negative  for fever and chills.  Respiratory: Positive for shortness of breath. Negative for cough.   Cardiovascular: Negative for chest pain.  All other systems reviewed and are negative.    Allergies  Review of patient's allergies indicates no known allergies.  Home Medications   Current Outpatient Rx  Name  Route  Sig  Dispense  Refill  . albuterol (PROVENTIL) (2.5 MG/3ML) 0.083% nebulizer solution   Nebulization   Take 3 mLs (2.5 mg total) by nebulization every 6 (six) hours as needed for wheezing or shortness of breath. For asthma   75 mL   1   . ARIPiprazole (ABILIFY) 2 MG tablet   Oral   Take 1 tablet (2 mg total) by mouth daily. For depression and mood stabilization.   30 tablet   0   . cyclobenzaprine (FLEXERIL) 10 MG tablet   Oral   Take 1 tablet (10 mg total) by mouth 3 (three) times daily.   15 tablet   0   . FLUoxetine (PROZAC) 40 MG capsule   Oral   Take 40 mg by mouth every morning. For depression.         . Fluticasone-Salmeterol (ADVAIR) 500-50 MCG/DOSE AEPB   Inhalation   Inhale 1 puff into the lungs every 12 (twelve)  hours.         . gabapentin (NEURONTIN) 100 MG capsule   Oral   Take 2 capsules (200 mg total) by mouth 3 (three) times daily. For anxiety and neuropathic pain.   180 capsule   0   . mometasone-formoterol (DULERA) 100-5 MCG/ACT AERO   Inhalation   Inhale 2 puffs into the lungs 2 (two) times daily. For asthma   8.8 g   0   . traZODone (DESYREL) 50 MG tablet   Oral   Take 50 mg by mouth at bedtime and may repeat dose one time if needed.         Marland Kitchen albuterol (PROVENTIL HFA;VENTOLIN HFA) 108 (90 BASE) MCG/ACT inhaler   Inhalation   Inhale 2 puffs into the lungs every 6 (six) hours as needed for wheezing.   1 Inhaler   2     Triage Vitals: BP 117/81  Pulse 97  Temp(Src) 97.3 F (36.3 C)  Resp 26  Ht 5\' 5"  (1.651 m)  Wt 214 lb (97.07 kg)  BMI 35.61 kg/m2  SpO2 96%  LMP 06/08/2012  Physical Exam  Nursing note and vitals  reviewed. Constitutional: She is oriented to person, place, and time. She appears well-developed and well-nourished. No distress.  She appears anxious.  HENT:  Head: Normocephalic and atraumatic.  Eyes: EOM are normal.  Neck: Neck supple. No tracheal deviation present.  Cardiovascular: Normal rate.   Pulmonary/Chest: She is in respiratory distress. She has wheezes.  Bilateral expiratory wheezes are present.   Musculoskeletal: Normal range of motion.  Neurological: She is alert and oriented to person, place, and time.  Skin: Skin is warm and dry.  Psychiatric: Her behavior is normal.    ED Course  Procedures (including critical care time)  DIAGNOSTIC STUDIES: Oxygen Saturation is 96% on room air,adequate by my interpretation.    COORDINATION OF CARE: 8:37 PM-Discussed treatment plan with pt which includes CXR, breathing treatment, and steroids. Pt agrees to the plan.   Labs Reviewed - No data to display  Dg Chest 2 View  06/29/2012   *RADIOLOGY REPORT*  Clinical Data: Shortness of breath.  Asthma attack.  CHEST - 2 VIEW  Comparison: 05/01/2011  Findings: Heart size is normal.  There is no pleural effusion or edema identified.  The bibasilar atelectasis is noted.  There is central airway thickening noted.  No airspace consolidation identified.  The visualized osseous structures is unremarkable  IMPRESSION:  1. Central airway thickening and lower lobe atelectasis.   Original Report Authenticated By: Signa Kell, [name]       No diagnosis found.    MDM  Patient is feeling better after neb treatments in the ED.  She was also given iv steroids.  No evidence for pneumonia on the chest xray.   I personally performed the services described in this documentation, which was scribed in my presence. The recorded information has been reviewed and is accurate.        Geoffery Lyons, MD 06/29/12 [phone]

## 2012-06-29 NOTE — ED Notes (Signed)
Patient still with expiratory wheezes after finishing continuous nebulizer. Oxygen saturation of 91-93% 2 liters of oxygen via nasal canula. Patient reports she feels better, denies any chest pain or tightness at this time, complains of slight shortness of breath.

## 2012-06-29 NOTE — ED Notes (Signed)
Having an asthma attack, can't breath per pt.

## 2012-12-05 ENCOUNTER — Emergency Department: Payer: Self-pay | Admitting: Emergency Medicine

## 2012-12-05 LAB — COMPREHENSIVE METABOLIC PANEL
Alkaline Phosphatase: 80 U/L (ref 50–136)
Anion Gap: 6 — ABNORMAL LOW (ref 7–16)
BUN: 7 mg/dL (ref 7–18)
Calcium, Total: 8.8 mg/dL (ref 8.5–10.1)
Creatinine: 0.77 mg/dL (ref 0.60–1.30)
EGFR (African American): >60
EGFR (Non-African Amer.): >60
Osmolality: 269 (ref 275–301)
Potassium: 3.3 mmol/L — ABNORMAL LOW (ref 3.5–5.1)

## 2012-12-05 LAB — CBC
HCT: 35.3 % (ref 35.0–47.0)
HGB: 11.9 g/dL — ABNORMAL LOW (ref 12.0–16.0)
MCH: 27.5 pg (ref 26.0–34.0)
RBC: 4.33 10*6/uL (ref 3.80–5.20)
RDW: 14.3 % (ref 11.5–14.5)
WBC: 9.7 10*3/uL (ref 3.6–11.0)

## 2012-12-05 LAB — TROPONIN I: Troponin-I: <0.02 ng/mL

## 2012-12-07 LAB — BETA STREP CULTURE(ARMC)

## 2013-01-19 ENCOUNTER — Encounter (HOSPITAL_COMMUNITY): Payer: Self-pay | Admitting: Emergency Medicine

## 2013-01-19 ENCOUNTER — Emergency Department (HOSPITAL_COMMUNITY): Payer: Self-pay

## 2013-01-19 ENCOUNTER — Inpatient Hospital Stay (HOSPITAL_COMMUNITY)
Admission: EM | Admit: 2013-01-19 | Discharge: 2013-01-22 | DRG: 194 | Disposition: A | Discharge status: Home / Self Care | Payer: Self-pay | Attending: Family Medicine | Admitting: Family Medicine

## 2013-01-19 DIAGNOSIS — M549 Dorsalgia, unspecified: Secondary | ICD-10-CM | POA: Diagnosis present

## 2013-01-19 DIAGNOSIS — J189 Pneumonia, unspecified organism: Principal | ICD-10-CM | POA: Diagnosis present

## 2013-01-19 DIAGNOSIS — E039 Hypothyroidism, unspecified: Secondary | ICD-10-CM | POA: Diagnosis present

## 2013-01-19 DIAGNOSIS — D649 Anemia, unspecified: Secondary | ICD-10-CM | POA: Diagnosis present

## 2013-01-19 DIAGNOSIS — J440 Chronic obstructive pulmonary disease with acute lower respiratory infection: Secondary | ICD-10-CM | POA: Diagnosis present

## 2013-01-19 DIAGNOSIS — R0781 Pleurodynia: Secondary | ICD-10-CM

## 2013-01-19 DIAGNOSIS — R197 Diarrhea, unspecified: Secondary | ICD-10-CM | POA: Diagnosis present

## 2013-01-19 DIAGNOSIS — R1110 Vomiting, unspecified: Secondary | ICD-10-CM

## 2013-01-19 DIAGNOSIS — R112 Nausea with vomiting, unspecified: Secondary | ICD-10-CM | POA: Diagnosis present

## 2013-01-19 DIAGNOSIS — J45901 Unspecified asthma with (acute) exacerbation: Secondary | ICD-10-CM

## 2013-01-19 DIAGNOSIS — R0902 Hypoxemia: Secondary | ICD-10-CM

## 2013-01-19 DIAGNOSIS — E860 Dehydration: Secondary | ICD-10-CM | POA: Diagnosis present

## 2013-01-19 DIAGNOSIS — F3289 Other specified depressive episodes: Secondary | ICD-10-CM | POA: Diagnosis present

## 2013-01-19 DIAGNOSIS — R071 Chest pain on breathing: Secondary | ICD-10-CM

## 2013-01-19 DIAGNOSIS — J209 Acute bronchitis, unspecified: Secondary | ICD-10-CM | POA: Diagnosis present

## 2013-01-19 DIAGNOSIS — Z8249 Family history of ischemic heart disease and other diseases of the circulatory system: Secondary | ICD-10-CM

## 2013-01-19 DIAGNOSIS — Z79899 Other long term (current) drug therapy: Secondary | ICD-10-CM

## 2013-01-19 DIAGNOSIS — F329 Major depressive disorder, single episode, unspecified: Secondary | ICD-10-CM | POA: Diagnosis present

## 2013-01-19 DIAGNOSIS — J441 Chronic obstructive pulmonary disease with (acute) exacerbation: Secondary | ICD-10-CM | POA: Diagnosis present

## 2013-01-19 DIAGNOSIS — Z87891 Personal history of nicotine dependence: Secondary | ICD-10-CM

## 2013-01-19 LAB — BASIC METABOLIC PANEL
CO2: 23 mEq/L (ref 19–32)
Calcium: 9.4 mg/dL (ref 8.4–10.5)
Chloride: 104 mEq/L (ref 96–112)
Creatinine, Ser: 0.73 mg/dL (ref 0.50–1.10)
GFR calc Af Amer: >90 mL/min (ref >90)
GFR calc non Af Amer: >90 mL/min (ref >90)
Sodium: 137 mEq/L (ref 135–145)

## 2013-01-19 LAB — CBC WITH DIFFERENTIAL/PLATELET
Basophils Absolute: 0 10*3/uL (ref 0.0–0.1)
Basophils Relative: 0 % (ref 0–1)
Eosinophils Absolute: 0.2 10*3/uL (ref 0.0–0.7)
Eosinophils Relative: 3 % (ref 0–5)
HCT: 37.1 % (ref 36.0–46.0)
Hemoglobin: 11.9 g/dL — ABNORMAL LOW (ref 12.0–15.0)
Lymphocytes Relative: 20 % (ref 12–46)
Lymphs Abs: 1.6 10*3/uL (ref 0.7–4.0)
MCH: 27.2 pg (ref 26.0–34.0)
MCHC: 32.1 g/dL (ref 30.0–36.0)
MCV: 84.7 fL (ref 78.0–100.0)
Monocytes Absolute: 0.7 10*3/uL (ref 0.1–1.0)
Monocytes Relative: 8 % (ref 3–12)
Neutro Abs: 5.5 10*3/uL (ref 1.7–7.7)
Neutrophils Relative %: 68 % (ref 43–77)
Platelets: 297 10*3/uL (ref 150–400)
RBC: 4.38 MIL/uL (ref 3.87–5.11)
RDW: 14.2 % (ref 11.5–15.5)
WBC: 8 10*3/uL (ref 4.0–10.5)

## 2013-01-19 LAB — TROPONIN I: Troponin I: <0.3 ng/mL (ref <0.30)

## 2013-01-19 MED ORDER — DIPHENHYDRAMINE HCL 50 MG/ML IJ SOLN
25.0000 mg | Freq: Once | INTRAMUSCULAR | Status: AC
  Administered 2013-01-19: 25 mg via INTRAVENOUS
  Filled 2013-01-19: qty 1

## 2013-01-19 MED ORDER — ENOXAPARIN SODIUM 40 MG/0.4ML ~~LOC~~ SOLN
40.0000 mg | SUBCUTANEOUS | Status: DC
  Administered 2013-01-19 – 2013-01-21 (×3): 40 mg via SUBCUTANEOUS
  Filled 2013-01-19 (×3): qty 0.4

## 2013-01-19 MED ORDER — KETOROLAC TROMETHAMINE 30 MG/ML IJ SOLN
30.0000 mg | Freq: Once | INTRAMUSCULAR | Status: AC
  Administered 2013-01-19: 30 mg via INTRAVENOUS
  Filled 2013-01-19: qty 1

## 2013-01-19 MED ORDER — SODIUM CHLORIDE 0.9 % IV SOLN
1000.0000 mL | Freq: Once | INTRAVENOUS | Status: AC
  Administered 2013-01-19: 500 mL via INTRAVENOUS

## 2013-01-19 MED ORDER — DEXTROSE 5 % IV SOLN
1.0000 g | INTRAVENOUS | Status: DC
  Administered 2013-01-20 – 2013-01-21 (×2): 1 g via INTRAVENOUS
  Filled 2013-01-19 (×3): qty 10

## 2013-01-19 MED ORDER — ONDANSETRON HCL 4 MG/2ML IJ SOLN: 4.0000 mg | Freq: Four times a day (QID) | INTRAMUSCULAR | Status: DC | PRN

## 2013-01-19 MED ORDER — CYCLOBENZAPRINE HCL 10 MG PO TABS
5.0000 mg | ORAL_TABLET | Freq: Once | ORAL | Status: AC
  Administered 2013-01-19: 5 mg via ORAL
  Filled 2013-01-19: qty 1

## 2013-01-19 MED ORDER — ACETAMINOPHEN 650 MG RE SUPP: 650.0000 mg | Freq: Four times a day (QID) | RECTAL | Status: DC | PRN

## 2013-01-19 MED ORDER — MORPHINE SULFATE 2 MG/ML IJ SOLN
1.0000 mg | INTRAMUSCULAR | Status: DC | PRN
  Administered 2013-01-20: 1 mg via INTRAVENOUS
  Filled 2013-01-19: qty 1

## 2013-01-19 MED ORDER — BUDESONIDE-FORMOTEROL FUMARATE 160-4.5 MCG/ACT IN AERO
2.0000 | INHALATION_SPRAY | Freq: Two times a day (BID) | RESPIRATORY_TRACT | Status: DC
  Administered 2013-01-20 – 2013-01-22 (×5): 2 via RESPIRATORY_TRACT
  Filled 2013-01-19: qty 6

## 2013-01-19 MED ORDER — FLUOXETINE HCL 20 MG PO CAPS
40.0000 mg | ORAL_CAPSULE | Freq: Every morning | ORAL | Status: DC
  Administered 2013-01-20 – 2013-01-22 (×3): 40 mg via ORAL
  Filled 2013-01-19: qty 2
  Filled 2013-01-19: qty 4
  Filled 2013-01-19: qty 2
  Filled 2013-01-19: qty 4
  Filled 2013-01-19: qty 2

## 2013-01-19 MED ORDER — METHYLPREDNISOLONE SODIUM SUCC 40 MG IJ SOLR
40.0000 mg | Freq: Four times a day (QID) | INTRAMUSCULAR | Status: DC
  Administered 2013-01-19 – 2013-01-20 (×2): 40 mg via INTRAVENOUS
  Filled 2013-01-19 (×2): qty 1

## 2013-01-19 MED ORDER — SODIUM CHLORIDE 0.9 % IV SOLN
INTRAVENOUS | Status: DC
  Administered 2013-01-20: 03:00:00 via INTRAVENOUS

## 2013-01-19 MED ORDER — ALBUTEROL SULFATE (5 MG/ML) 0.5% IN NEBU: 2.5000 mg | INHALATION_SOLUTION | RESPIRATORY_TRACT | Status: DC | PRN

## 2013-01-19 MED ORDER — ALBUTEROL (5 MG/ML) CONTINUOUS INHALATION SOLN
15.0000 mg/h | INHALATION_SOLUTION | Freq: Once | RESPIRATORY_TRACT | Status: AC
  Administered 2013-01-19: 15 mg/h via RESPIRATORY_TRACT

## 2013-01-19 MED ORDER — DEXTROSE 5 % IV SOLN
1.0000 g | Freq: Once | INTRAVENOUS | Status: AC
  Administered 2013-01-19: 1 g via INTRAVENOUS
  Filled 2013-01-19: qty 10

## 2013-01-19 MED ORDER — IPRATROPIUM BROMIDE 0.02 % IN SOLN
0.5000 mg | Freq: Once | RESPIRATORY_TRACT | Status: AC
  Administered 2013-01-19: 0.5 mg via RESPIRATORY_TRACT
  Filled 2013-01-19: qty 2.5

## 2013-01-19 MED ORDER — LEVOTHYROXINE SODIUM 100 MCG PO TABS
100.0000 ug | ORAL_TABLET | Freq: Every day | ORAL | Status: DC
  Administered 2013-01-20 – 2013-01-22 (×3): 100 ug via ORAL
  Filled 2013-01-19 (×5): qty 1

## 2013-01-19 MED ORDER — ALBUTEROL SULFATE (5 MG/ML) 0.5% IN NEBU
2.5000 mg | INHALATION_SOLUTION | RESPIRATORY_TRACT | Status: DC
  Administered 2013-01-19 – 2013-01-22 (×15): 2.5 mg via RESPIRATORY_TRACT
  Filled 2013-01-19 (×17): qty 0.5

## 2013-01-19 MED ORDER — METHYLPREDNISOLONE SODIUM SUCC 125 MG IJ SOLR
125.0000 mg | Freq: Once | INTRAMUSCULAR | Status: AC
  Administered 2013-01-19: 125 mg via INTRAVENOUS
  Filled 2013-01-19: qty 2

## 2013-01-19 MED ORDER — SODIUM CHLORIDE 0.9 % IV SOLN
1000.0000 mL | INTRAVENOUS | Status: DC
  Administered 2013-01-19: 1000 mL via INTRAVENOUS

## 2013-01-19 MED ORDER — GABAPENTIN 100 MG PO CAPS
200.0000 mg | ORAL_CAPSULE | Freq: Three times a day (TID) | ORAL | Status: DC
  Administered 2013-01-19 – 2013-01-22 (×8): 200 mg via ORAL
  Filled 2013-01-19 (×13): qty 2

## 2013-01-19 MED ORDER — ALBUTEROL SULFATE (5 MG/ML) 0.5% IN NEBU
5.0000 mg | INHALATION_SOLUTION | Freq: Once | RESPIRATORY_TRACT | Status: AC
  Administered 2013-01-19: 5 mg via RESPIRATORY_TRACT
  Filled 2013-01-19: qty 1

## 2013-01-19 MED ORDER — SODIUM CHLORIDE 0.9 % IJ SOLN
3.0000 mL | Freq: Two times a day (BID) | INTRAMUSCULAR | Status: DC
  Administered 2013-01-20 – 2013-01-22 (×5): 3 mL via INTRAVENOUS

## 2013-01-19 MED ORDER — VITAMIN B-1 100 MG PO TABS
100.0000 mg | ORAL_TABLET | Freq: Every day | ORAL | Status: DC
  Administered 2013-01-20 – 2013-01-22 (×3): 100 mg via ORAL
  Filled 2013-01-19 (×3): qty 1

## 2013-01-19 MED ORDER — IPRATROPIUM BROMIDE 0.02 % IN SOLN
0.5000 mg | RESPIRATORY_TRACT | Status: DC
  Administered 2013-01-19 – 2013-01-22 (×15): 0.5 mg via RESPIRATORY_TRACT
  Filled 2013-01-19 (×17): qty 2.5

## 2013-01-19 MED ORDER — DEXTROSE 5 % IV SOLN
500.0000 mg | INTRAVENOUS | Status: DC
  Administered 2013-01-19: 500 mg via INTRAVENOUS

## 2013-01-19 MED ORDER — CYCLOBENZAPRINE HCL 10 MG PO TABS
10.0000 mg | ORAL_TABLET | Freq: Three times a day (TID) | ORAL | Status: DC
  Administered 2013-01-19 – 2013-01-22 (×8): 10 mg via ORAL
  Filled 2013-01-19 (×8): qty 1

## 2013-01-19 MED ORDER — ACETAMINOPHEN 325 MG PO TABS: 650.0000 mg | ORAL_TABLET | Freq: Four times a day (QID) | ORAL | Status: DC | PRN

## 2013-01-19 MED ORDER — AZITHROMYCIN 500 MG IV SOLR: 500.0000 mg | INTRAVENOUS | Status: DC

## 2013-01-19 MED ORDER — METOCLOPRAMIDE HCL 5 MG/ML IJ SOLN
10.0000 mg | Freq: Once | INTRAMUSCULAR | Status: AC
  Administered 2013-01-19: 10 mg via INTRAVENOUS
  Filled 2013-01-19: qty 2

## 2013-01-19 MED ORDER — ONDANSETRON HCL 4 MG PO TABS: 4.0000 mg | ORAL_TABLET | Freq: Four times a day (QID) | ORAL | Status: DC | PRN

## 2013-01-19 NOTE — H&P (Signed)
[facility] History and Physical  [name] NWG:[id number] DOB: 1976-01-07 DOA: 01/19/2013   PCP: Toma Deiters, MD  Specialists: None  Chief Complaint: Nausea, vomiting, diarrhea, and shortness of breath  HPI: [name] is a 37 y.o. female with a past medical history of asthma/COPD, hypothyroidism, who quit smoking about 7 months ago. She was in her usual state of health about 2 days ago, when she started having nausea and vomiting. This was followed by onset of diarrhea. She had fever. She didn't check her temperature. She had chills and this was followed by onset of shortness of breath and cough with greenish expectoration, without any blood. She had sharp, pleuritic chest pain in the center of the chest, which worsened with the deep breathing and cough. No radiation of the pain. Pain is about 6/10 in intensity. Denies any blood in the stool. Her last bowel movement was earlier today. He hasn't had any vomiting since she has been in the ED. She said her son was sick with a respiratory infection a few days ago. She denies any travel anywhere. Denies any new medications. She is on inhalers and nebulizer treatments at home, which he has been taking. She decided to come in to the hospital as that she was not improving. She was given nebulizer treatments and antibiotics in the ED, without much improvement. Hence we were called for further evaluation.  Home Medications: Prior to Admission medications   Medication Sig Start Date End Date Taking? Authorizing Provider  albuterol (PROVENTIL HFA;VENTOLIN HFA) 108 (90 BASE) MCG/ACT inhaler Inhale 2 puffs into the lungs every 6 (six) hours as needed for wheezing or shortness of breath.   Yes Historical Provider, MD  albuterol (PROVENTIL) (2.5 MG/3ML) 0.083% nebulizer solution Take 3 mLs (2.5 mg total) by nebulization every 6 (six) hours as needed for wheezing or shortness of breath. For asthma 02/01/11  Yes Elliot Cousin, MD   budesonide-formoterol [facility]) 160-4.5 MCG/ACT inhaler Inhale 2 puffs into the lungs 2 (two) times daily.   Yes Historical Provider, MD  cyclobenzaprine (FLEXERIL) 10 MG tablet Take 1 tablet (10 mg total) by mouth 3 (three) times daily. 01/19/12  Yes Verne Spurr, PA-C  FLUoxetine (PROZAC) 40 MG capsule Take 40 mg by mouth every morning. For depression. 01/19/12  Yes Verne Spurr, PA-C  gabapentin (NEURONTIN) 100 MG capsule Take 2 capsules (200 mg total) by mouth 3 (three) times daily. For anxiety and neuropathic pain. 01/19/12  Yes Verne Spurr, PA-C  levothyroxine (SYNTHROID, LEVOTHROID) 100 MCG tablet Take 100 mcg by mouth daily before breakfast.   Yes Historical Provider, MD    Allergies: No Known Allergies  Past Medical History: Past Medical History  Diagnosis Date  . COPD (chronic obstructive pulmonary disease)   . Asthma   . Depression   . Tobacco abuse   . Alcohol abuse   . Anemia 01/31/2011  . Hypoxia 02/01/2011  . Needs sleep apnea assessment 2013.07.31    Past Surgical History  Procedure Laterality Date  . Back surgery    . Cesarean section    . Dilation and curettage of uterus    . Ankle ganglion cyst excision      Social History: Patient lives in [place] with her son. 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 She say she quit smoking 7 months ago. She has occasional beer. Denies any drug use. She is independent with daily activities.  Family History:  Family History  Problem Relation Age of Onset  . Hypothyroidism  Mother   . Coronary artery disease Mother      Review of Systems - History obtained from the patient General ROS: positive for  - chills, fatigue, fever and malaise Psychological ROS: positive for - anxiety Ophthalmic ROS: negative ENT ROS: negative Allergy and Immunology ROS: negative Hematological and Lymphatic ROS: negative Endocrine ROS: negative Respiratory ROS: as in hpi Cardiovascular ROS: as in hpi Gastrointestinal ROS: as in  hpi Genito-Urinary ROS: no dysuria, trouble voiding, or hematuria Musculoskeletal ROS: negative Neurological ROS: no TIA or stroke symptoms Dermatological ROS: negative  Physical Examination  Filed Vitals:   01/19/13 2017 01/19/13 2018 01/19/13 2120 01/19/13 2153  BP: 104/67 104/67 96/59   Pulse: 112 114 111   Temp:      TempSrc:      Resp: 23 17 17    Height:      Weight:      SpO2: 100% 98% 93% 95%    General appearance: alert, cooperative, appears stated age and no distress Head: Normocephalic, without obvious abnormality, atraumatic Eyes: conjunctivae/corneas clear. PERRL, EOM's intact. Throat: lips, mucosa, and tongue normal; teeth and gums normal Neck: no adenopathy, no carotid bruit, no JVD, supple, symmetrical, trachea midline and thyroid not enlarged, symmetric, no tenderness/mass/nodules Back: symmetric, no curvature. ROM normal. No CVA tenderness. Resp: Diffuse end expiratory wheezing bilaterally, with a few crackles. Rhonchi is present as well more so in the left lung field. Cardio: S1-S2 is tachycardic. Regular. No S3, S4. No rubs, murmurs, or bruit. No pedal edema. GI: soft, non-tender; bowel sounds normal; no masses,  no organomegaly Extremities: extremities normal, atraumatic, no cyanosis or edema Pulses: 2+ and symmetric Skin: Skin color, texture, turgor normal. No rashes or lesions Lymph nodes: Cervical, supraclavicular, and axillary nodes normal. Neurologic: Alert and oriented X 3, normal strength and tone. Normal symmetric reflexes. Normal coordination and gait  Laboratory Data: Results for orders placed during the hospital encounter of 01/19/13 (from the past 48 hour(s))  CBC WITH DIFFERENTIAL     Status: Abnormal   Collection Time    01/19/13  7:04 PM      Result Value Range   WBC 8.0  4.0 - 10.5 K/uL   RBC 4.38  3.87 - 5.11 MIL/uL   Hemoglobin 11.9 (*) 12.0 - 15.0 g/dL   HCT 52.8  41.3 - 24.4 %   MCV 84.7  78.0 - 100.0 fL   MCH 27.2  26.0 - 34.0 pg    MCHC 32.1  30.0 - 36.0 g/dL   RDW 01.0  27.2 - 53.6 %   Platelets 297  150 - 400 K/uL   Neutrophils Relative % 68  43 - 77 %   Neutro Abs 5.5  1.7 - 7.7 K/uL   Lymphocytes Relative 20  12 - 46 %   Lymphs Abs 1.6  0.7 - 4.0 K/uL   Monocytes Relative 8  3 - 12 %   Monocytes Absolute 0.7  0.1 - 1.0 K/uL   Eosinophils Relative 3  0 - 5 %   Eosinophils Absolute 0.2  0.0 - 0.7 K/uL   Basophils Relative 0  0 - 1 %   Basophils Absolute 0.0  0.0 - 0.1 K/uL  BASIC METABOLIC PANEL     Status: Abnormal   Collection Time    01/19/13  7:04 PM      Result Value Range   Sodium 137  135 - 145 mEq/L   Potassium 3.5  3.5 - 5.1 mEq/L   Chloride 104  96 - 112 mEq/L   CO2 23  19 - 32 mEq/L   Glucose, Bld 102 (*) 70 - 99 mg/dL   BUN 5 (*) 6 - 23 mg/dL   Creatinine, Ser 4.09  0.50 - 1.10 mg/dL   Calcium 9.4  8.4 - 81.1 mg/dL   GFR calc non Af Amer >90  >90 mL/min   GFR calc Af Amer >90  >90 mL/min   Comment: (NOTE)     The eGFR has been calculated using the CKD EPI equation.     This calculation has not been validated in all clinical situations.     eGFR's persistently <90 mL/min signify possible Chronic Kidney     Disease.    Radiology Reports: Dg Chest 2 View  01/19/2013   CLINICAL DATA:  Cough, fever  EXAM: CHEST  2 VIEW  COMPARISON:  06/29/2012  FINDINGS: Cardiomediastinal silhouette is stable. There is infiltrate/pneumonia in left upper lobe perihilar region. Follow-up to resolution after treatment is recommended. No pulmonary edema.  IMPRESSION: Infiltrate/pneumonia in left upper lobe perihilar region. Follow-up to resolution is recommended.   Electronically Signed   By: Natasha Mead [name]   On: 01/19/2013 18:23    Electrocardiogram: Pending  Problem List  Principal Problem:   CAP (community acquired pneumonia) Active Problems:   Acute bronchitis   Dehydration   Nausea vomiting and diarrhea   Assessment: This is a 37 year old, Caucasian female, who presents with nausea, vomiting, and  diarrhea, along with the cough, and feeling short of breath. She's been wheezing as well. She is noted to have a pneumonia in the left upper lobe. This is most likely community-acquired. She could have started off with viral gastroenteritis. Her chest pain is pleuritic and most likely due to the bronchitis and pneumonia.  Plan: #1 community-acquired pneumonia with diffuse wheezing and acute bronchitis: She'll be given antibiotics, steroids, nebulizer treatments. Urine will be checked for Streptococcus and Legionella antigens. Oxygen as needed. Check EKG, and troponin due to chest pain.  #2 nausea, vomiting, and diarrhea: Possibly acute gastroenteritis. She'll be given symptomatic treatment. IV hydration will be provided.  #3 history of hypothyroidism: Continue with Synthroid.  #4 history of depression: Continue with her psychotropic agents.  #5 history of back pains: Continue with her home medications.   DVT Prophylaxis: Lovenox Code Status: Full code Family Communication: Discussed with the patient and her mother  Disposition Plan: Admit to telemetry   Further management decisions will depend on results of further testing and patient's response to treatment.  [facility]  [facility] Pager [phone]  If 7PM-7AM, please contact night-coverage [email or web address] Password [facility]  01/19/2013, 9:56 PM

## 2013-01-19 NOTE — ED Notes (Signed)
Pt with decrease appetite, mid chest today, productive cough today with fever, N/V since last night, last vomited this morning per pt

## 2013-01-19 NOTE — ED Provider Notes (Signed)
CSN: [id number]     Arrival date & time 01/19/13  1747 History   First MD Initiated Contact with Patient 01/19/13 1820     Chief Complaint  Patient presents with  . Shortness of Breath   (Consider location/radiation/quality/duration/timing/severity/associated sxs/prior Treatment) HPI Patient reports 2 days ago she started having diarrhea. She's had 3 episodes. Yesterday morning she started having nausea and vomited 4 times. She has had chills with shortness of breath and headache. She started having pain in her central chest yesterday evening. She's been coughing with minimal green mucus. She has been wheezing and states her inhaler is not helping. She has some sore throat but denies rhinorrhea. She does have some sneezing. Both her son and her mother also been ill recently. Patient states she had something similar months ago but got better quickly.  PCP Dr Olena Leatherwood  Past Medical History  Diagnosis Date  . COPD (chronic obstructive pulmonary disease)   . Asthma   . Depression   . Tobacco abuse   . Alcohol abuse   . Anemia 01/31/2011  . Hypoxia 02/01/2011  . Needs sleep apnea assessment 2013.07.31   Past Surgical History  Procedure Laterality Date  . Back surgery    . Cesarean section    . Dilation and curettage of uterus    . Ankle ganglion cyst excision     Family History  Problem Relation Age of Onset  . Hypothyroidism Mother   . Coronary artery disease Mother    History  Substance Use Topics  . Smoking status: Current Every Day Smoker -- 1.00 packs/day    Types: Cigarettes  . Smokeless tobacco: Not on file  . Alcohol Use: 0.0 oz/week    12-18 Cans of beer per week   States she quit smoking 6 months ago Drinks alcohol occasionally Works as a Financial risk analyst   OB History   Grav Para Term Preterm Abortions TAB SAB Ect Mult Living                 Review of Systems  All other systems reviewed and are negative.    Allergies  Review of patient's allergies indicates no  known allergies.  Home Medications   Current Outpatient Rx  Name  Route  Sig  Dispense  Refill  . albuterol (PROVENTIL HFA;VENTOLIN HFA) 108 (90 BASE) MCG/ACT inhaler   Inhalation   Inhale 2 puffs into the lungs every 6 (six) hours as needed for wheezing or shortness of breath.         Marland Kitchen albuterol (PROVENTIL) (2.5 MG/3ML) 0.083% nebulizer solution   Nebulization   Take 3 mLs (2.5 mg total) by nebulization every 6 (six) hours as needed for wheezing or shortness of breath. For asthma   75 mL   1   . budesonide-formoterol (SYMBICORT) 160-4.5 MCG/ACT inhaler   Inhalation   Inhale 2 puffs into the lungs 2 (two) times daily.         . cyclobenzaprine (FLEXERIL) 10 MG tablet   Oral   Take 1 tablet (10 mg total) by mouth 3 (three) times daily.   15 tablet   0   . FLUoxetine (PROZAC) 40 MG capsule   Oral   Take 40 mg by mouth every morning. For depression.         . gabapentin (NEURONTIN) 100 MG capsule   Oral   Take 2 capsules (200 mg total) by mouth 3 (three) times daily. For anxiety and neuropathic pain.   180 capsule   0   .  levothyroxine (SYNTHROID, LEVOTHROID) 100 MCG tablet   Oral   Take 100 mcg by mouth daily before breakfast.         prozac symbicort neurontin Synthroid 100 mcg    BP 122/71  Pulse 87  Temp(Src) 99.9 F (37.7 C) (Oral)  Resp 17  Ht 5\' 5"  (1.651 m)  Wt 215 lb (97.523 kg)  BMI 35.78 kg/m2  SpO2 97%  LMP 01/19/2013  Vital signs normal    Physical Exam  Nursing note and vitals reviewed. Constitutional: She is oriented to person, place, and time. She appears well-developed and well-nourished.  Non-toxic appearance. She does not appear ill. No distress.  HENT:  Head: Normocephalic and atraumatic.  Right Ear: External ear normal.  Left Ear: External ear normal.  Nose: Nose normal. No mucosal edema or rhinorrhea.  Mouth/Throat: Mucous membranes are normal. No dental abscesses or uvula swelling.  Dry tongue  Eyes: Conjunctivae and  EOM are normal. Pupils are equal, round, and reactive to light.  Neck: Normal range of motion and full passive range of motion without pain. Neck supple.  Cardiovascular: Normal rate, regular rhythm and normal heart sounds.  Exam reveals no gallop and no friction rub.   No murmur heard. Pulmonary/Chest: Effort normal. No respiratory distress. She has decreased breath sounds. She has wheezes. She has no rhonchi. She has no rales. She exhibits no tenderness and no crepitus.  coughing  Abdominal: Soft. Normal appearance and bowel sounds are normal. She exhibits no distension. There is no tenderness. There is no rebound and no guarding.  Musculoskeletal: Normal range of motion. She exhibits no edema and no tenderness.  Moves all extremities well.   Neurological: She is alert and oriented to person, place, and time. She has normal strength. No cranial nerve deficit.  Skin: Skin is warm, dry and intact. No rash noted. No erythema. No pallor.  Psychiatric: She has a normal mood and affect. Her speech is normal and behavior is normal. Her mood appears not anxious.    ED Course  Procedures (including critical care time)  Medications  0.9 %  sodium chloride infusion (500 mLs Intravenous New Bag/Given 01/19/13 1904)    Followed by  0.9 %  sodium chloride infusion (1,000 mLs Intravenous New Bag/Given 01/19/13 2129)  azithromycin (ZITHROMAX) 500 mg in dextrose 5 % 250 mL IVPB (0 mg Intravenous Stopped 01/19/13 2128)  metoCLOPramide (REGLAN) injection 10 mg (10 mg Intravenous Given 01/19/13 1904)  diphenhydrAMINE (BENADRYL) injection 25 mg (25 mg Intravenous Given 01/19/13 1904)  albuterol (PROVENTIL,VENTOLIN) solution continuous neb (15 mg/hr Nebulization Given 01/19/13 1927)  ipratropium (ATROVENT) nebulizer solution 0.5 mg (0.5 mg Nebulization Given 01/19/13 1927)  cefTRIAXone (ROCEPHIN) 1 g in dextrose 5 % 50 mL IVPB (0 g Intravenous Stopped 01/19/13 1932)  methylPREDNISolone sodium succinate (SOLU-MEDROL)  125 mg/2 mL injection 125 mg (125 mg Intravenous Given 01/19/13 1904)    20:23 Recheck continuous nebulizer almost finished, feeling better, chest less tight. However still has diffuse rhonchi in all lung fields. Will see how she does ambulating with pulse ox.   21:25 Pt ambulated by nursing staff. They report her pulse ox was 92-94 % on RA with tachycardia. States she feels Okay, however she has diffuse wheezing now. She is coughing again. Pt c/o chest wall pain, given IV toradol and oral flexeril.  A regular nebulizer was ordered.   21:31 Dr Rito Ehrlich, admit to tele, team 1   Labs Review Results for orders placed during the hospital encounter of 01/19/13  CBC WITH DIFFERENTIAL      Result Value Range   WBC 8.0  4.0 - 10.5 K/uL   RBC 4.38  3.87 - 5.11 MIL/uL   Hemoglobin 11.9 (*) 12.0 - 15.0 g/dL   HCT 16.1  09.6 - 04.5 %   MCV 84.7  78.0 - 100.0 fL   MCH 27.2  26.0 - 34.0 pg   MCHC 32.1  30.0 - 36.0 g/dL   RDW 40.9  81.1 - 91.4 %   Platelets 297  150 - 400 K/uL   Neutrophils Relative % 68  43 - 77 %   Neutro Abs 5.5  1.7 - 7.7 K/uL   Lymphocytes Relative 20  12 - 46 %   Lymphs Abs 1.6  0.7 - 4.0 K/uL   Monocytes Relative 8  3 - 12 %   Monocytes Absolute 0.7  0.1 - 1.0 K/uL   Eosinophils Relative 3  0 - 5 %   Eosinophils Absolute 0.2  0.0 - 0.7 K/uL   Basophils Relative 0  0 - 1 %   Basophils Absolute 0.0  0.0 - 0.1 K/uL  BASIC METABOLIC PANEL      Result Value Range   Sodium 137  135 - 145 mEq/L   Potassium 3.5  3.5 - 5.1 mEq/L   Chloride 104  96 - 112 mEq/L   CO2 23  19 - 32 mEq/L   Glucose, Bld 102 (*) 70 - 99 mg/dL   BUN 5 (*) 6 - 23 mg/dL   Creatinine, Ser 7.82  0.50 - 1.10 mg/dL   Calcium 9.4  8.4 - 95.6 mg/dL   GFR calc non Af Amer >90  >90 mL/min   GFR calc Af Amer >90  >90 mL/min     Imaging Review Dg Chest 2 View  01/19/2013   CLINICAL DATA:  Cough, fever  EXAM: CHEST  2 VIEW  COMPARISON:  06/29/2012  FINDINGS: Cardiomediastinal silhouette is stable. There  is infiltrate/pneumonia in left upper lobe perihilar region. Follow-up to resolution after treatment is recommended. No pulmonary edema.  IMPRESSION: Infiltrate/pneumonia in left upper lobe perihilar region. Follow-up to resolution is recommended.   Electronically Signed   By: Natasha Mead [name]   On: 01/19/2013 18:23    EKG Interpretation   None       MDM   1. CAP (community acquired pneumonia)   2. Asthma with acute exacerbation   3. Vomiting and diarrhea     Plan admission   Devoria Albe, MD, Franz Dell, MD 01/19/13 2134

## 2013-01-19 NOTE — ED Notes (Addendum)
Cough developed yesterday. Fever today along with vomiting. Last neb was 30 min PTA with no relief. Last vomited at noon. Also states headache. States sore throat, body aches and discomfort to chest with taking a deep breath and when coughing.

## 2013-01-19 NOTE — ED Notes (Addendum)
Ambulated patient around nursing station oxygen stats stayed between 92 and 94 percent. Heart rate was 122 to 129. Vitals checked upon returning to room.

## 2013-01-19 NOTE — ED Notes (Signed)
Patient given Sprite at this time.  ?

## 2013-01-19 NOTE — ED Notes (Signed)
Patient states chest hurting due to coughing non cardiac.

## 2013-01-19 NOTE — ED Notes (Signed)
Attempted to call report when nurse did not call for report in 15 min., was told that she would call this nurse back

## 2013-01-20 DIAGNOSIS — R1110 Vomiting, unspecified: Secondary | ICD-10-CM

## 2013-01-20 DIAGNOSIS — R197 Diarrhea, unspecified: Secondary | ICD-10-CM

## 2013-01-20 LAB — COMPREHENSIVE METABOLIC PANEL
ALT: 12 U/L (ref 0–35)
AST: 17 U/L (ref 0–37)
Albumin: 3.3 g/dL — ABNORMAL LOW (ref 3.5–5.2)
Alkaline Phosphatase: 72 U/L (ref 39–117)
BUN: 7 mg/dL (ref 6–23)
Calcium: 8.7 mg/dL (ref 8.4–10.5)
Chloride: 110 mEq/L (ref 96–112)
Creatinine, Ser: 0.6 mg/dL (ref 0.50–1.10)
GFR calc Af Amer: >90 mL/min (ref >90)
GFR calc non Af Amer: >90 mL/min (ref >90)
Sodium: 141 mEq/L (ref 135–145)
Total Bilirubin: 0.2 mg/dL — ABNORMAL LOW (ref 0.3–1.2)

## 2013-01-20 LAB — CBC
Hemoglobin: 11.3 g/dL — ABNORMAL LOW (ref 12.0–15.0)
MCH: 27.1 pg (ref 26.0–34.0)
MCHC: 31.6 g/dL (ref 30.0–36.0)
MCV: 85.9 fL (ref 78.0–100.0)
RBC: 4.17 MIL/uL (ref 3.87–5.11)
RDW: 14.4 % (ref 11.5–15.5)

## 2013-01-20 MED ORDER — METHYLPREDNISOLONE SODIUM SUCC 40 MG IJ SOLR
40.0000 mg | Freq: Three times a day (TID) | INTRAMUSCULAR | Status: DC
  Administered 2013-01-20 – 2013-01-21 (×3): 40 mg via INTRAVENOUS
  Filled 2013-01-20 (×3): qty 1

## 2013-01-20 MED ORDER — AZITHROMYCIN 250 MG PO TABS
500.0000 mg | ORAL_TABLET | Freq: Every day | ORAL | Status: DC
  Administered 2013-01-20 – 2013-01-21 (×2): 500 mg via ORAL
  Filled 2013-01-20 (×2): qty 2

## 2013-01-20 NOTE — Care Management Note (Addendum)
    Page 1 of 1   01/22/2013     1:33:07 PM   CARE MANAGEMENT NOTE 01/22/2013  Patient:  RASHADA, KLONTZ A   Account Number:  [id number]  Date Initiated:  01/20/2013  Documentation initiated by:  Sharrie Rothman  Subjective/Objective Assessment:   Pt admitted from home with pneumonia. Pt lives with her son and will return home at discharge. Pt has home O2 with [facility] and a neb machine. Pt is independent with ADL's.     Action/Plan:   No CM needs noted.   Anticipated DC Date:  01/22/2013   Anticipated DC Plan:  HOME/SELF CARE      DC Planning Services  CM consult      Choice offered to / List presented to:             Status of service:  Completed, signed off Medicare Important Message given?   (If response is "NO", the following Medicare IM given date fields will be blank) Date Medicare IM given:   Date Additional Medicare IM given:    Discharge Disposition:  HOME/SELF CARE  Per UR Regulation:    If discussed at Long Length of Stay Meetings, dates discussed:    Comments:  01/22/13 1330 Arlyss Queen, RN BSN CM Pt discharged home today. No CM needs noted.  01/20/13 1345 Arlyss Queen, RN BSN CM

## 2013-01-20 NOTE — Progress Notes (Addendum)
ANTIBIOTIC CONSULT NOTE - INITIAL  Pharmacy Consult for renal dose adjustments for antibiotics Indication: rule out pneumonia  No Known Allergies  Patient Measurements: Height: 5\' 5"  (165.1 cm) Weight: 201 lb 1 oz (91.2 kg) IBW/kg (Calculated) : 57  Vital Signs: Temp: 97.9 F (36.6 C) (12/08 7829) Temp src: Oral (12/07 2300) BP: 132/82 mmHg (12/08 0627) Pulse Rate: 91 (12/08 0627) Intake/Output from previous day: 12/07 0701 - 12/08 0700 In: 153.3 [I.V.:153.3] Out: -  Intake/Output from this shift: Total I/O In: 120 [P.O.:120] Out: -   Labs:  Recent Labs  01/19/13 1904 01/20/13 0451  WBC 8.0 8.7  HGB 11.9* 11.3*  PLT 297 297  CREATININE 0.73 0.60   Estimated Creatinine Clearance: 106.4 ml/min (by C-G formula based on Cr of 0.6). No results found for this basename: VANCOTROUGH, VANCOPEAK, VANCORANDOM, GENTTROUGH, GENTPEAK, GENTRANDOM, TOBRATROUGH, TOBRAPEAK, TOBRARND, AMIKACINPEAK, AMIKACINTROU, AMIKACIN,  in the last 72 hours   Microbiology: No results found for this or any previous visit (from the past 720 hour(s)).  Medical History: Past Medical History  Diagnosis Date  . COPD (chronic obstructive pulmonary disease)   . Asthma   . Depression   . Tobacco abuse   . Alcohol abuse   . Anemia 01/31/2011  . Hypoxia 02/01/2011  . Needs sleep apnea assessment 2013.07.31   Medications:  Scheduled:  . albuterol  2.5 mg Nebulization Q4H  . azithromycin  500 mg Oral q1800  . budesonide-formoterol  2 puff Inhalation BID  . cefTRIAXone (ROCEPHIN)  IV  1 g Intravenous Q24H  . cyclobenzaprine  10 mg Oral TID  . enoxaparin (LOVENOX) injection  40 mg Subcutaneous Q24H  . FLUoxetine  40 mg Oral q morning - 10a  . gabapentin  200 mg Oral TID  . ipratropium  0.5 mg Nebulization Q4H  . levothyroxine  100 mcg Oral QAC breakfast  . methylPREDNISolone (SOLU-MEDROL) injection  40 mg Intravenous Q8H  . sodium chloride  3 mL Intravenous Q12H  . thiamine  100 mg Oral Daily    Assessment: 37yo female initiated on Zithromax and Rocephin for increased SOB.  Pt has good renal fxn.  Estimated Creatinine Clearance: 106.4 ml/min (by C-G formula based on Cr of 0.6). Pt is currently afebrile with normal WBC.  Goal of Therapy:  Eradicate infection.  Plan:  Rocephin 1gm IV q24hrs Zithromax 500mg  PO q24hrs Monitor labs and progress  Valrie Hart A 01/20/2013,10:04 AM  Patient clinically improved.  Pharmacy to sign off.   Junita Push, PharmD, BCPS

## 2013-01-20 NOTE — Progress Notes (Signed)
UR chart review completed.  

## 2013-01-20 NOTE — Progress Notes (Signed)
PATIENT DETAILS Name: [name] Age: 37 y.o. Sex: female Date of Birth: 1975-06-25 Admit Date: 01/19/2013 Admitting Physician Osvaldo Shipper, MD UJW:[id number],GNFA A, MD  Subjective: Feels better this morning. No further diarrhea or vomiting.  Assessment/Plan: Principal Problem:  CAP (community acquired pneumonia) - Patient was admitted and started on intravenous Rocephin and Zithromax. She remained afebrile, no leukocytosis. Clinically improved. - Currently on day 2 of Rocephin and Zithromax-continue - She likely will need a repeat chest x-ray 6-8 weeks from discharge to document resolution of the consolidation.  Active Problems: Acute asthmatic bronchitis - Patient was found to be wheezing, former smoker (quit 7 months back). She was started on IV Solu-Medrol, nebulized bronchodilators. Better this morning - Still some wheezing, we'll continue with bronchodilators and IV Solu-Medrol.  Dehydration - Resolved secondary to dehydration - Stop all IV fluids  Nausea vomiting and diarrhea - Resolved with supportive care - Stop all IV fluids. Anti-emetics as needed  Hypothyroidism -Continue with Synthroid.  Depression Continue with her psychotropic agents.   History of back pains -stable-no focal exams on exam -Continue with her home medications.  Disposition: Remain inpatient- home is likely on 12/9  DVT Prophylaxis: Prophylactic Lovenox   Code Status: Full code  Family Communication None at bedside  Procedures:  None  CONSULTS:  None  Time spent 40 minutes-which includes 50% of the time with face-to-face with patient and family.**   MEDICATIONS: Scheduled Meds: . albuterol  2.5 mg Nebulization Q4H  . azithromycin  500 mg Intravenous Q24H  . budesonide-formoterol  2 puff Inhalation BID  . cefTRIAXone (ROCEPHIN)  IV  1 g Intravenous Q24H  . cyclobenzaprine  10 mg Oral TID  . enoxaparin (LOVENOX) injection  40 mg Subcutaneous Q24H  .  FLUoxetine  40 mg Oral q morning - 10a  . gabapentin  200 mg Oral TID  . ipratropium  0.5 mg Nebulization Q4H  . levothyroxine  100 mcg Oral QAC breakfast  . methylPREDNISolone (SOLU-MEDROL) injection  40 mg Intravenous Q6H  . sodium chloride  3 mL Intravenous Q12H  . thiamine  100 mg Oral Daily   Continuous Infusions: . sodium chloride 100 mL/hr at 01/20/13 0300   PRN Meds:.acetaminophen, acetaminophen, albuterol, morphine injection, ondansetron (ZOFRAN) IV, ondansetron  Antibiotics: Anti-infectives   Start     Dose/Rate Route Frequency Ordered Stop   01/20/13 1900  cefTRIAXone (ROCEPHIN) 1 g in dextrose 5 % 50 mL IVPB     1 g 100 mL/hr over 30 Minutes Intravenous Every 24 hours 01/19/13 2233 01/27/13 1859   01/20/13 1900  azithromycin (ZITHROMAX) 500 mg in dextrose 5 % 250 mL IVPB     500 mg 250 mL/hr over 60 Minutes Intravenous Every 24 hours 01/19/13 2233 01/27/13 1859   01/19/13 1900  cefTRIAXone (ROCEPHIN) 1 g in dextrose 5 % 50 mL IVPB     1 g 100 mL/hr over 30 Minutes Intravenous  Once 01/19/13 1845 01/19/13 1932   01/19/13 1900  azithromycin (ZITHROMAX) 500 mg in dextrose 5 % 250 mL IVPB  Status:  Discontinued     500 mg 250 mL/hr over 60 Minutes Intravenous Every 24 hours 01/19/13 1845 01/19/13 2233       PHYSICAL EXAM: Vital signs in last 24 hours: Filed Vitals:   01/19/13 2352 01/20/13 0624 01/20/13 0627 01/20/13 0701  BP:  113/64 132/82   Pulse:  82 91   Temp:  98.1 F (36.7 C) 97.9 F (36.6 C)   TempSrc:  Resp:  20 20   Height:      Weight:      SpO2: 87% 95% 100% 92%    Weight change:  Filed Weights   01/19/13 1804 01/19/13 2245  Weight: 97.523 kg (215 lb) 91.2 kg (201 lb 1 oz)   Body mass index is 33.46 kg/(m^2).   Gen Exam: Awake and alert with clear speech.   Neck: Supple, No JVD.   Chest: B/L in good air entry,  rhonchi all over   CVS: S1 S2 Regular, no murmurs.  Abdomen: soft, BS +, non tender, non distended.  Extremities: no edema,  lower extremities warm to touch Neurologic: Non Focal.   Skin: No Rash.   Wounds: N/A.    Intake/Output from previous day:  Intake/Output Summary (Last 24 hours) at 01/20/13 0830 Last data filed at 01/20/13 0432  Gross per 24 hour  Intake 153.33 ml  Output      0 ml  Net 153.33 ml     LAB RESULTS: CBC  Recent Labs Lab 01/19/13 1904 01/20/13 0451  WBC 8.0 8.7  HGB 11.9* 11.3*  HCT 37.1 35.8*  PLT 297 297  MCV 84.7 85.9  MCH 27.2 27.1  MCHC 32.1 31.6  RDW 14.2 14.4  LYMPHSABS 1.6  --   MONOABS 0.7  --   EOSABS 0.2  --   BASOSABS 0.0  --     Chemistries   Recent Labs Lab 01/19/13 1904 01/20/13 0451  NA 137 141  K 3.5 4.1  CL 104 110  CO2 23 20  GLUCOSE 102* 152*  BUN 5* 7  CREATININE 0.73 0.60  CALCIUM 9.4 8.7    CBG: No results found for this basename: GLUCAP,  in the last 168 hours  GFR Estimated Creatinine Clearance: 106.4 ml/min (by C-G formula based on Cr of 0.6).  Coagulation profile No results found for this basename: INR, PROTIME,  in the last 168 hours  Cardiac Enzymes  Recent Labs Lab 01/19/13 1904  TROPONINI <0.30    No components found with this basename: POCBNP,  No results found for this basename: DDIMER,  in the last 72 hours No results found for this basename: HGBA1C,  in the last 72 hours No results found for this basename: CHOL, HDL, LDLCALC, TRIG, CHOLHDL, LDLDIRECT,  in the last 72 hours No results found for this basename: TSH, T4TOTAL, FREET3, T3FREE, THYROIDAB,  in the last 72 hours No results found for this basename: VITAMINB12, FOLATE, FERRITIN, TIBC, IRON, RETICCTPCT,  in the last 72 hours No results found for this basename: LIPASE, AMYLASE,  in the last 72 hours  Urine Studies No results found for this basename: UACOL, UAPR, USPG, UPH, UTP, UGL, UKET, UBIL, UHGB, UNIT, UROB, ULEU, UEPI, UWBC, URBC, UBAC, CAST, CRYS, UCOM, BILUA,  in the last 72 hours  MICROBIOLOGY: No results found for this or any previous visit  (from the past 240 hour(s)).  RADIOLOGY STUDIES/RESULTS: Dg Chest 2 View  01/19/2013   CLINICAL DATA:  Cough, fever  EXAM: CHEST  2 VIEW  COMPARISON:  06/29/2012  FINDINGS: Cardiomediastinal silhouette is stable. There is infiltrate/pneumonia in left upper lobe perihilar region. Follow-up to resolution after treatment is recommended. No pulmonary edema.  IMPRESSION: Infiltrate/pneumonia in left upper lobe perihilar region. Follow-up to resolution is recommended.   Electronically Signed   By: Natasha Mead [name]   On: 01/19/2013 18:23    Jeoffrey Massed, MD  [facility] Pager:[phone]  If 7PM-7AM, please contact night-coverage [email or web address]  Password [email or web address] 01/20/2013, 8:30 AM   LOS: 1 day

## 2013-01-21 DIAGNOSIS — M549 Dorsalgia, unspecified: Secondary | ICD-10-CM

## 2013-01-21 DIAGNOSIS — J45901 Unspecified asthma with (acute) exacerbation: Secondary | ICD-10-CM

## 2013-01-21 LAB — LEGIONELLA ANTIGEN, URINE

## 2013-01-21 LAB — STREP PNEUMONIAE URINARY ANTIGEN: Strep Pneumo Urinary Antigen: NEGATIVE

## 2013-01-21 MED ORDER — METHYLPREDNISOLONE SODIUM SUCC 40 MG IJ SOLR
40.0000 mg | Freq: Two times a day (BID) | INTRAMUSCULAR | Status: DC
  Administered 2013-01-21 – 2013-01-22 (×2): 40 mg via INTRAVENOUS
  Filled 2013-01-21 (×2): qty 1

## 2013-01-21 NOTE — Progress Notes (Addendum)
PATIENT DETAILS Name: [name] Age: 37 y.o. Sex: female Date of Birth: [birth year] Admit Date: 01/19/2013 Admitting Physician Osvaldo Shipper, MD [id number],OZHY A, MD  Brief Summary 37 y.o. female with a past medical history of asthma/COPD, hypothyroidism, who quit smoking about 7 months ago presented to the hospital with fever, nausea, vomiting. This was followed by shortness of breath and cough with greenish expectoration. She claimed that her sick with a respiratory infection a few days ago. She was found to have PNA, and acute asthma exac and then admitted to the hospitalist service. She continues to improve with antibiotics, steroids and nebs, but not yet back to her baseline yet.  Subjective: Continues to improve, SOB better, but still with coarse rhonchi and SOB on ambulation. No nausea/vomiting since admission.  Assessment/Plan: Principal Problem:  CAP (community acquired pneumonia) - Patient was admitted and started on intravenous Rocephin and Zithromax. She remains afebrile, no leukocytosis. Clinically improvement continues - Currently on day 3 of Rocephin and Zithromax-continue while inpatient. - She likely will need a repeat chest x-ray 6-8 weeks from discharge to document resolution of the consolidation.  Other data: Urine Strep Pneumo antigen-neg Urine Legionella antigen-pending HIV neg  Active Problems: Acute asthmatic bronchitis - Patient was found to be wheezing, former smoker (quit 7 months back). She was started on IV Solu-Medrol, nebulized bronchodilators. Better this morning - Still some wheezing, we'll continue with bronchodilators and IV Solu-Medrol-but start tapering down solumedrol  Dehydration - Resolved secondary to dehydration - Stop all IV fluids  Nausea vomiting and diarrhea - Resolved with supportive care - Stop all IV fluids. Anti-emetics as needed  Hypothyroidism -Continue with Synthroid.  Depression Continue with her  psychotropic agents.   History of back pains -stable-no focal exams on exam -Continue with her home medications.  Disposition: Remain inpatient- home is likely on 12/10  DVT Prophylaxis: Prophylactic Lovenox   Code Status: Full code  Family Communication None at bedside  Procedures:  None  CONSULTS:  None   MEDICATIONS: Scheduled Meds: . albuterol  2.5 mg Nebulization Q4H  . azithromycin  500 mg Oral q1800  . budesonide-formoterol  2 puff Inhalation BID  . cefTRIAXone (ROCEPHIN)  IV  1 g Intravenous Q24H  . cyclobenzaprine  10 mg Oral TID  . enoxaparin (LOVENOX) injection  40 mg Subcutaneous Q24H  . FLUoxetine  40 mg Oral q morning - 10a  . gabapentin  200 mg Oral TID  . ipratropium  0.5 mg Nebulization Q4H  . levothyroxine  100 mcg Oral QAC breakfast  . methylPREDNISolone (SOLU-MEDROL) injection  40 mg Intravenous Q8H  . sodium chloride  3 mL Intravenous Q12H  . thiamine  100 mg Oral Daily   Continuous Infusions:   PRN Meds:.acetaminophen, acetaminophen, albuterol, morphine injection, ondansetron (ZOFRAN) IV, ondansetron  Antibiotics: Anti-infectives   Start     Dose/Rate Route Frequency Ordered Stop   01/20/13 1900  cefTRIAXone (ROCEPHIN) 1 g in dextrose 5 % 50 mL IVPB     1 g 100 mL/hr over 30 Minutes Intravenous Every 24 hours 01/19/13 2233 01/27/13 1859   01/20/13 1900  azithromycin (ZITHROMAX) 500 mg in dextrose 5 % 250 mL IVPB  Status:  Discontinued     500 mg 250 mL/hr over 60 Minutes Intravenous Every 24 hours 01/19/13 2233 01/20/13 1004   01/20/13 1800  azithromycin (ZITHROMAX) tablet 500 mg     500 mg Oral Daily-1800 01/20/13 1004     01/19/13 1900  cefTRIAXone (ROCEPHIN) 1 g in  dextrose 5 % 50 mL IVPB     1 g 100 mL/hr over 30 Minutes Intravenous  Once 01/19/13 1845 01/19/13 1932   01/19/13 1900  azithromycin (ZITHROMAX) 500 mg in dextrose 5 % 250 mL IVPB  Status:  Discontinued     500 mg 250 mL/hr over 60 Minutes Intravenous Every 24 hours  01/19/13 1845 01/19/13 2233       PHYSICAL EXAM: Vital signs in last 24 hours: Filed Vitals:   01/20/13 2347 01/21/13 0410 01/21/13 0834 01/21/13 1059  BP:      Pulse:   93   Temp:      TempSrc:      Resp:   17   Height:      Weight:      SpO2: 95% 95% 94% 93%    Weight change:  Filed Weights   01/19/13 1804 01/19/13 2245  Weight: 97.523 kg (215 lb) 91.2 kg (201 lb 1 oz)   Body mass index is 33.46 kg/(m^2).   Gen Exam: Awake and alert with clear speech.   Neck: Supple, No JVD.   Chest: B/L in good air entry, still with coarse rhonchi all over   CVS: S1 S2 Regular, no murmurs.  Abdomen: soft, BS +, non tender, non distended.  Extremities: no edema, lower extremities warm to touch Neurologic: Non Focal.   Skin: No Rash.   Wounds: N/A.    Intake/Output from previous day:  Intake/Output Summary (Last 24 hours) at 01/21/13 1211 Last data filed at 01/21/13 0918  Gross per 24 hour  Intake    600 ml  Output      0 ml  Net    600 ml     LAB RESULTS: CBC  Recent Labs Lab 01/19/13 1904 01/20/13 0451  WBC 8.0 8.7  HGB 11.9* 11.3*  HCT 37.1 35.8*  PLT 297 297  MCV 84.7 85.9  MCH 27.2 27.1  MCHC 32.1 31.6  RDW 14.2 14.4  LYMPHSABS 1.6  --   MONOABS 0.7  --   EOSABS 0.2  --   BASOSABS 0.0  --     Chemistries   Recent Labs Lab 01/19/13 1904 01/20/13 0451  NA 137 141  K 3.5 4.1  CL 104 110  CO2 23 20  GLUCOSE 102* 152*  BUN 5* 7  CREATININE 0.73 0.60  CALCIUM 9.4 8.7    CBG: No results found for this basename: GLUCAP,  in the last 168 hours  GFR Estimated Creatinine Clearance: 106.4 ml/min (by C-G formula based on Cr of 0.6).  Coagulation profile No results found for this basename: INR, PROTIME,  in the last 168 hours  Cardiac Enzymes  Recent Labs Lab 01/19/13 1904  TROPONINI <0.30    No components found with this basename: POCBNP,  No results found for this basename: DDIMER,  in the last 72 hours No results found for this  basename: HGBA1C,  in the last 72 hours No results found for this basename: CHOL, HDL, LDLCALC, TRIG, CHOLHDL, LDLDIRECT,  in the last 72 hours No results found for this basename: TSH, T4TOTAL, FREET3, T3FREE, THYROIDAB,  in the last 72 hours No results found for this basename: VITAMINB12, FOLATE, FERRITIN, TIBC, IRON, RETICCTPCT,  in the last 72 hours No results found for this basename: LIPASE, AMYLASE,  in the last 72 hours  Urine Studies No results found for this basename: UACOL, UAPR, USPG, UPH, UTP, UGL, UKET, UBIL, UHGB, UNIT, UROB, ULEU, UEPI, UWBC, URBC, UBAC, CAST, CRYS, UCOM,  BILUA,  in the last 72 hours  MICROBIOLOGY: No results found for this or any previous visit (from the past 240 hour(s)).  RADIOLOGY STUDIES/RESULTS: Dg Chest 2 View  01/19/2013   CLINICAL DATA:  Cough, fever  EXAM: CHEST  2 VIEW  COMPARISON:  06/29/2012  FINDINGS: Cardiomediastinal silhouette is stable. There is infiltrate/pneumonia in left upper lobe perihilar region. Follow-up to resolution after treatment is recommended. No pulmonary edema.  IMPRESSION: Infiltrate/pneumonia in left upper lobe perihilar region. Follow-up to resolution is recommended.   Electronically Signed   By: Natasha Mead [name]   On: 01/19/2013 18:23    Jeoffrey Massed, MD  [facility] Pager:[phone]  If 7PM-7AM, please contact night-coverage [email or web address] Password [email or web address] 01/21/2013, 12:11 PM   LOS: 2 days

## 2013-01-22 DIAGNOSIS — J441 Chronic obstructive pulmonary disease with (acute) exacerbation: Secondary | ICD-10-CM

## 2013-01-22 DIAGNOSIS — R0902 Hypoxemia: Secondary | ICD-10-CM

## 2013-01-22 MED ORDER — PREDNISONE 20 MG PO TABS: 40.0000 mg | ORAL_TABLET | Freq: Every day | ORAL | Status: DC

## 2013-01-22 MED ORDER — CEFUROXIME AXETIL 250 MG PO TABS: 500.0000 mg | ORAL_TABLET | Freq: Two times a day (BID) | ORAL | Status: DC

## 2013-01-22 MED ORDER — PREDNISONE 20 MG PO TABS: ORAL_TABLET | ORAL | Status: DC

## 2013-01-22 MED ORDER — CEFUROXIME AXETIL 500 MG PO TABS: 500.0000 mg | ORAL_TABLET | Freq: Two times a day (BID) | ORAL | Status: DC

## 2013-01-22 MED ORDER — AZITHROMYCIN 500 MG PO TABS: 500.0000 mg | ORAL_TABLET | Freq: Every day | ORAL | Status: DC

## 2013-01-22 NOTE — Progress Notes (Signed)
[facility] PROGRESS NOTE  [name] ZOX:[id number] DOB: [birth year] DOA: 01/19/2013 PCP: Toma Deiters, MD  Assessment/Plan: 1. Community acquired pneumonia: Rapidly improving. No hypoxia. Change to oral antibiotics. HIV negative, urine strep antigen negative, urine Legionella antigen negative. 2. Acute asthmatic bronchitis: Treated with steroids and nebulizers. Rapidly improving. Change to oral steroids. Continue bronchodilators. 3. COPD: Uses home oxygen at night. 4. Dehydration: Resolved. Secondary to acute illness. 5. Nausea, vomiting, diarrhea: Resolved with supportive care. Likely secondary to acute illness.   Change to oral antibiotics  Discharge home today  May return to work 12/11  Pending studies:   none   Brendia Sacks, MD  [facility]  Pager [phone] If 7PM-7AM, please contact night-coverage at [email or web address], password [facility] 01/22/2013, 12:46 PM  LOS: 3 days   Summary: 37 y.o. female with a past medical history of asthma/COPD, hypothyroidism, who quit smoking about 7 months ago presented to the hospital with fever, nausea, vomiting. This was followed by shortness of breath and cough with greenish expectoration. She claimed that her sick with a respiratory infection a few days ago. She was found to have PNA, and acute asthma exac and then admitted to the hospitalist service.  Consultants:    Procedures:    Antibiotics:  Zithromax 12/7 >> 12/11  Ceftriaxone 12/7 >> 12/9  Ceftin 12/10 >> 12/13  HPI/Subjective: "I feel better, can I go home?". Breathing better. No complaints.  Objective: Filed Vitals:   01/22/13 0438 01/22/13 0622 01/22/13 0655 01/22/13 1225  BP:  115/67    Pulse:  64    Temp:  97.7 F (36.5 C)    TempSrc:  Oral    Resp:  16    Height:      Weight:      SpO2: 97% 97% 91% 94%    Intake/Output Summary (Last 24 hours) at 01/22/13 1246 Last data filed at 01/22/13 0845  Gross per 24 hour  Intake    240 ml   Output      0 ml  Net    240 ml     Filed Weights   01/19/13 1804 01/19/13 2245  Weight: 97.523 kg (215 lb) 91.2 kg (201 lb 1 oz)    Exam:   Afebrile, vital signs stable. No hypoxia.  General: Appears calm and comfortable. Speech fluent and clear. Well-appearing.  Cardiovascular: Regular rate and rhythm. No murmur, rub or gallop.  Respiratory: Few wheezes, no frank rhonchi or rales. Normal respiratory effort. Fair air movement.  Data Reviewed:  No new data  Scheduled Meds: . albuterol  2.5 mg Nebulization Q4H  . azithromycin  500 mg Oral q1800  . budesonide-formoterol  2 puff Inhalation BID  . cefTRIAXone (ROCEPHIN)  IV  1 g Intravenous Q24H  . cyclobenzaprine  10 mg Oral TID  . enoxaparin (LOVENOX) injection  40 mg Subcutaneous Q24H  . FLUoxetine  40 mg Oral q morning - 10a  . gabapentin  200 mg Oral TID  . ipratropium  0.5 mg Nebulization Q4H  . levothyroxine  100 mcg Oral QAC breakfast  . methylPREDNISolone (SOLU-MEDROL) injection  40 mg Intravenous Q12H  . sodium chloride  3 mL Intravenous Q12H  . thiamine  100 mg Oral Daily   Continuous Infusions:   Principal Problem:   CAP (community acquired pneumonia) Active Problems:   Acute bronchitis   Dehydration   Nausea vomiting and diarrhea

## 2013-01-22 NOTE — Discharge Summary (Signed)
Physician Discharge Summary  [name] NFA:[id number] DOB: Jan 16, 1976 DOA: 01/19/2013  PCP: Kari Deiters, MD  Admit date: 01/19/2013 Discharge date: 01/22/2013  Recommendations for Outpatient Follow-up:  1. Followup resolution of pneumonia. Could consider repeat chest x-ray in 4-6 weeks to document resolution.   Follow-up Information   Follow up with Kari A, MD In 1 week.   Specialty:  Internal Medicine   Contact information:   [address] [place] [address] [phone]      Discharge Diagnoses:  1. Community acquired pneumonia 2. Acute asthmatic bronchitis 3. COPD 4. Dehydration 5. Nausea, vomiting, diarrhea  Discharge Condition: Improved Disposition: Home  Diet recommendation: Regular  Filed Weights   01/19/13 1804 01/19/13 2245  Weight: 97.523 kg (215 lb) 91.2 kg (201 lb 1 oz)    History of present illness:  37 y.o. female with Matthews past medical history of asthma/COPD, hypothyroidism, who quit smoking about 7 months ago presented to the hospital with fever, nausea, vomiting. This was followed by shortness of breath and cough with greenish expectoration. She claimed that her sick with Matthews respiratory infection Matthews few days ago. She was found to have PNA, and acute asthma exac and then admitted to the hospitalist service.  Hospital Course:  [name] was treated with empiric antibiotics for pneumonia, steroids nebulizers treatments for acute bronchitis. She rapidly improved, was weaned off oxygen and is now stable for discharge. Her hospitalization was uncomplicated.  1. Community acquired pneumonia: Rapidly improving. No hypoxia. Change to oral antibiotics. HIV negative, urine strep antigen negative, urine Legionella antigen negative. 2. Acute asthmatic bronchitis: Treated with steroids and nebulizers. Rapidly improving. Change to oral steroids. Continue bronchodilators. 3. COPD: Uses home oxygen at night. 4. Dehydration: Resolved. Secondary to acute  illness. 5. Nausea, vomiting, diarrhea: Resolved with supportive care. Likely secondary to acute illness.  Consultants: none Procedures: none Antibiotics:  Zithromax 12/7 >> 12/11  Ceftriaxone 12/7 >> 12/9  Ceftin 12/10 >> 12/13  Discharge Instructions  Discharge Orders   Future Orders Complete By Expires   Activity as tolerated - No restrictions  As directed    Diet general  As directed    Discharge instructions  As directed    Comments:     Clear physician or seek immediate medical attention for shortness of breath or worsening of condition.       Medication List         albuterol (2.5 MG/3ML) 0.083% nebulizer solution  Commonly known as:  PROVENTIL  Take 3 mLs (2.5 mg total) by nebulization every 6 (six) hours as needed for wheezing or shortness of breath. For asthma     albuterol 108 (90 BASE) MCG/ACT inhaler  Commonly known as:  PROVENTIL HFA;VENTOLIN HFA  Inhale 2 puffs into the lungs every 6 (six) hours as needed for wheezing or shortness of breath.     azithromycin 500 MG tablet  Commonly known as:  ZITHROMAX  Take 1 tablet (500 mg total) by mouth daily. Take in the evening.     budesonide-formoterol 160-4.5 MCG/ACT inhaler  Commonly known as:  SYMBICORT  Inhale 2 puffs into the lungs 2 (two) times daily.     cefUROXime 500 MG tablet  Commonly known as:  CEFTIN  Take 1 tablet (500 mg total) by mouth 2 (two) times daily with Matthews meal.     cyclobenzaprine 10 MG tablet  Commonly known as:  FLEXERIL  Take 1 tablet (10 mg total) by mouth 3 (three) times daily.  FLUoxetine 40 MG capsule  Commonly known as:  PROZAC  Take 40 mg by mouth every morning. For depression.     gabapentin 100 MG capsule  Commonly known as:  NEURONTIN  Take 2 capsules (200 mg total) by mouth 3 (three) times daily. For anxiety and neuropathic pain.     levothyroxine 100 MCG tablet  Commonly known as:  SYNTHROID, LEVOTHROID  Take 100 mcg by mouth daily before breakfast.      predniSONE 20 MG tablet  Commonly known as:  DELTASONE  12/11-12/13: Take 40 mg by mouth in the morning daily. 12/14-12/16: Take 20 mg by mouth in the morning daily. 12/17-12/19: Take 10 mg by mouth in morning then stop.       No Known Allergies  The results of significant diagnostics from this hospitalization (including imaging, microbiology, ancillary and laboratory) are listed below for reference.    Significant Diagnostic Studies: Dg Chest 2 View  01/19/2013   CLINICAL DATA:  Cough, fever  EXAM: CHEST  2 VIEW  COMPARISON:  06/29/2012  FINDINGS: Cardiomediastinal silhouette is stable. There is infiltrate/pneumonia in left upper lobe perihilar region. Follow-up to resolution after treatment is recommended. No pulmonary edema.  IMPRESSION: Infiltrate/pneumonia in left upper lobe perihilar region. Follow-up to resolution is recommended.   Electronically Signed   By: [name] [name]   On: 01/19/2013 18:23    Labs: Basic Metabolic Panel:  Recent Labs Lab 01/19/13 1904 01/20/13 0451  NA 137 141  K 3.5 4.1  CL 104 110  CO2 23 20  GLUCOSE 102* 152*  BUN 5* 7  CREATININE 0.73 0.60  CALCIUM 9.4 8.7   Liver Function Tests:  Recent Labs Lab 01/20/13 0451  AST 17  ALT 12  ALKPHOS 72  BILITOT 0.2*  PROT 7.3  ALBUMIN 3.3*   CBC:  Recent Labs Lab 01/19/13 1904 01/20/13 0451  WBC 8.0 8.7  NEUTROABS 5.5  --   HGB 11.9* 11.3*  HCT 37.1 35.8*  MCV 84.7 85.9  PLT 297 297   Cardiac Enzymes:  Recent Labs Lab 01/19/13 1904  TROPONINI <0.30    Principal Problem:   CAP (community acquired pneumonia) Active Problems:   Acute bronchitis   Dehydration   Nausea vomiting and diarrhea   Time coordinating discharge: 35 minutes  Signed:  Brendia Sacks, MD [facility] 01/22/2013, 1:28 PM

## 2013-01-22 NOTE — Progress Notes (Signed)
Patient states understanding of discharge instructions, prescription and work excuse given.

## 2013-05-05 LAB — BASIC METABOLIC PANEL
Anion Gap: 8 (ref 7–16)
BUN: 6 mg/dL — ABNORMAL LOW (ref 7–18)
CALCIUM: 8.6 mg/dL (ref 8.5–10.1)
CHLORIDE: 108 mmol/L — AB (ref 98–107)
CREATININE: 0.65 mg/dL (ref 0.60–1.30)
Co2: 21 mmol/L (ref 21–32)
EGFR (African American): >60
EGFR (Non-African Amer.): >60
Glucose: 76 mg/dL (ref 65–99)
OSMOLALITY: 270 (ref 275–301)
Potassium: 4.1 mmol/L (ref 3.5–5.1)
Sodium: 137 mmol/L (ref 136–145)

## 2013-05-05 LAB — CBC
HCT: 38.3 % (ref 35.0–47.0)
HGB: 12.3 g/dL (ref 12.0–16.0)
MCH: 26.7 pg (ref 26.0–34.0)
MCHC: 32.2 g/dL (ref 32.0–36.0)
MCV: 83 fL (ref 80–100)
PLATELETS: 223 10*3/uL (ref 150–440)
RBC: 4.61 10*6/uL (ref 3.80–5.20)
RDW: 15.9 % — AB (ref 11.5–14.5)
WBC: 5.2 10*3/uL (ref 3.6–11.0)

## 2013-05-05 LAB — TROPONIN I

## 2013-05-06 ENCOUNTER — Inpatient Hospital Stay: Payer: Self-pay | Admitting: Internal Medicine

## 2013-05-10 ENCOUNTER — Inpatient Hospital Stay: Payer: Self-pay | Admitting: Specialist

## 2013-05-10 LAB — COMPREHENSIVE METABOLIC PANEL
AST: 8 U/L — AB (ref 15–37)
Albumin: 3.8 g/dL (ref 3.4–5.0)
Alkaline Phosphatase: 95 U/L
Anion Gap: 7 (ref 7–16)
BUN: 13 mg/dL (ref 7–18)
Bilirubin,Total: 0.2 mg/dL (ref 0.2–1.0)
CHLORIDE: 104 mmol/L (ref 98–107)
Calcium, Total: 9 mg/dL (ref 8.5–10.1)
Co2: 27 mmol/L (ref 21–32)
Creatinine: 1.03 mg/dL (ref 0.60–1.30)
EGFR (Non-African Amer.): >60
Glucose: 89 mg/dL (ref 65–99)
Osmolality: 275 (ref 275–301)
Potassium: 2.8 mmol/L — ABNORMAL LOW (ref 3.5–5.1)
SGPT (ALT): 17 U/L (ref 12–78)
Sodium: 138 mmol/L (ref 136–145)
TOTAL PROTEIN: 7.7 g/dL (ref 6.4–8.2)

## 2013-05-10 LAB — CBC WITH DIFFERENTIAL/PLATELET
BASOS ABS: 0 10*3/uL (ref 0.0–0.1)
Basophil %: 0.5 %
EOS PCT: 0.1 %
Eosinophil #: 0 10*3/uL (ref 0.0–0.7)
HCT: 42 % (ref 35.0–47.0)
HGB: 13.5 g/dL (ref 12.0–16.0)
LYMPHS ABS: 2.9 10*3/uL (ref 1.0–3.6)
Lymphocyte %: 31.6 %
MCH: 26.6 pg (ref 26.0–34.0)
MCHC: 32 g/dL (ref 32.0–36.0)
MCV: 83 fL (ref 80–100)
Monocyte #: 0.6 x10 3/mm (ref 0.2–0.9)
Monocyte %: 6.7 %
NEUTROS PCT: 61.1 %
Neutrophil #: 5.5 10*3/uL (ref 1.4–6.5)
PLATELETS: 257 10*3/uL (ref 150–440)
RBC: 5.06 10*6/uL (ref 3.80–5.20)
RDW: 15.9 % — ABNORMAL HIGH (ref 11.5–14.5)
WBC: 9.1 10*3/uL (ref 3.6–11.0)

## 2013-05-10 LAB — TSH: Thyroid Stimulating Horm: 20.5 u[IU]/mL — ABNORMAL HIGH

## 2013-05-10 LAB — URINALYSIS, COMPLETE
BILIRUBIN, UR: NEGATIVE
Bacteria: NONE SEEN
GLUCOSE, UR: NEGATIVE mg/dL (ref 0–75)
Ketone: NEGATIVE
Leukocyte Esterase: NEGATIVE
Nitrite: NEGATIVE
PH: 5 (ref 4.5–8.0)
Protein: NEGATIVE
RBC,UR: <1 /HPF (ref 0–5)
SPECIFIC GRAVITY: 1.014 (ref 1.003–1.030)
WBC UR: 1 /HPF (ref 0–5)

## 2013-05-10 LAB — TROPONIN I: Troponin-I: <0.02 ng/mL

## 2013-05-11 LAB — CBC WITH DIFFERENTIAL/PLATELET
Basophil #: 0 10*3/uL (ref 0.0–0.1)
Basophil %: 0.1 %
EOS ABS: 0 10*3/uL (ref 0.0–0.7)
Eosinophil %: 0 %
HCT: 36.1 % (ref 35.0–47.0)
HGB: 11.8 g/dL — AB (ref 12.0–16.0)
LYMPHS ABS: 1 10*3/uL (ref 1.0–3.6)
LYMPHS PCT: 13.8 %
MCH: 27.2 pg (ref 26.0–34.0)
MCHC: 32.7 g/dL (ref 32.0–36.0)
MCV: 83 fL (ref 80–100)
MONO ABS: 0.3 x10 3/mm (ref 0.2–0.9)
MONOS PCT: 3.8 %
NEUTROS ABS: 6.3 10*3/uL (ref 1.4–6.5)
NEUTROS PCT: 82.3 %
PLATELETS: 243 10*3/uL (ref 150–440)
RBC: 4.34 10*6/uL (ref 3.80–5.20)
RDW: 15.6 % — ABNORMAL HIGH (ref 11.5–14.5)
WBC: 7.6 10*3/uL (ref 3.6–11.0)

## 2013-05-11 LAB — BASIC METABOLIC PANEL
Anion Gap: 3 — ABNORMAL LOW (ref 7–16)
BUN: 12 mg/dL (ref 7–18)
CO2: 27 mmol/L (ref 21–32)
Calcium, Total: 8.7 mg/dL (ref 8.5–10.1)
Chloride: 106 mmol/L (ref 98–107)
Creatinine: 0.64 mg/dL (ref 0.60–1.30)
EGFR (African American): >60
EGFR (Non-African Amer.): >60
GLUCOSE: 143 mg/dL — AB (ref 65–99)
OSMOLALITY: 274 (ref 275–301)
Potassium: 4.1 mmol/L (ref 3.5–5.1)
SODIUM: 136 mmol/L (ref 136–145)

## 2013-05-11 LAB — CULTURE, BLOOD (SINGLE)

## 2013-05-13 LAB — CBC WITH DIFFERENTIAL/PLATELET
BASOS ABS: 0 10*3/uL (ref 0.0–0.1)
BASOS PCT: 0.1 %
Eosinophil #: 0 10*3/uL (ref 0.0–0.7)
Eosinophil %: 0 %
HCT: 36.2 % (ref 35.0–47.0)
HGB: 11.6 g/dL — ABNORMAL LOW (ref 12.0–16.0)
LYMPHS PCT: 20.3 %
Lymphocyte #: 2.4 10*3/uL (ref 1.0–3.6)
MCH: 26.4 pg (ref 26.0–34.0)
MCHC: 32.1 g/dL (ref 32.0–36.0)
MCV: 82 fL (ref 80–100)
Monocyte #: 0.9 x10 3/mm (ref 0.2–0.9)
Monocyte %: 7.2 %
Neutrophil #: 8.7 10*3/uL — ABNORMAL HIGH (ref 1.4–6.5)
Neutrophil %: 72.4 %
PLATELETS: 264 10*3/uL (ref 150–440)
RBC: 4.4 10*6/uL (ref 3.80–5.20)
RDW: 15.3 % — ABNORMAL HIGH (ref 11.5–14.5)
WBC: 12 10*3/uL — ABNORMAL HIGH (ref 3.6–11.0)

## 2013-05-13 LAB — DRUG SCREEN, URINE
Amphetamines, Ur Screen: NEGATIVE (ref ?–1000)
Barbiturates, Ur Screen: NEGATIVE (ref ?–200)
Benzodiazepine, Ur Scrn: NEGATIVE (ref ?–200)
CANNABINOID 50 NG, UR ~~LOC~~: NEGATIVE (ref ?–50)
COCAINE METABOLITE, UR ~~LOC~~: NEGATIVE (ref ?–300)
MDMA (Ecstasy)Ur Screen: NEGATIVE (ref ?–500)
METHADONE, UR SCREEN: NEGATIVE (ref ?–300)
Opiate, Ur Screen: NEGATIVE (ref ?–300)
Phencyclidine (PCP) Ur S: NEGATIVE (ref ?–25)
TRICYCLIC, UR SCREEN: NEGATIVE (ref ?–1000)

## 2013-05-13 LAB — MAGNESIUM: Magnesium: 1.7 mg/dL — ABNORMAL LOW

## 2013-05-15 LAB — CREATININE, SERUM
Creatinine: 0.71 mg/dL (ref 0.60–1.30)
EGFR (African American): >60
EGFR (Non-African Amer.): >60

## 2014-06-02 NOTE — H&P (Signed)
PATIENT NAME:  [name], WARTMAN MR#:  [id number] DATE OF BIRTH:  1976/02/06  DATE OF ADMISSION:  01/26/2012  IDENTIFYING INFORMATION AND CHIEF COMPLAINT: This is a 39 year old woman who came to the Emergency Room with a chief complaint of feeling suicidal and wanting to get detox. Her chief complaint to me is, " I am tired of living this way."   HISTORY OF PRESENT ILLNESS: The patient states that she is tired of her substance abuse lifestyle. She drinks about 18 beers a day. Last drink was the morning before she came to the Emergency Room. She is also smoking crack cocaine daily. Occasionally she snorts narcotic pills as well. She has been using alcohol for years and the cocaine use has been escalating over the past several months. Currently she is not living anywhere stable but has been staying mostly in crack houses. Her mood is feeling increasingly depressed and hopeless. She has had suicidal thoughts, but without any specific plan. Does not report any psychotic symptoms. She says she is very disappointed with herself and frustrated. Currently she is complaining of tactile hallucinations as symptoms of withdrawal. Also feeling very jittery and feeling like she is not thinking clearly. She has not been using any of her prescribed medicine regularly.   PAST PSYCHIATRIC HISTORY: Never been admitted to our facility before, but has been admitted to other inpatient facilities in the past and is familiar with treatment through [facility] and has been to residential treatment programs in the past. Has been treated for depression with several different antidepressants, most recently Prozac, which she says has been the most helpful. She does say that she has tried to hurt herself with overdoses in the past.   SOCIAL HISTORY: The patient is currently living in crack houses. Not working. Little positive social contact. She has been able to work in the past, but mostly when she has been in controlled circumstances to  stay sober.   PAST MEDICAL HISTORY: The patient has asthma, which continues to be a significant problem for her. Also has pain in her ankle where she recently had surgery. Also has chronic back pain.   FAMILY HISTORY: Does not know of family history of mental health problems.   CURRENT MEDICATIONS: Prozac 40 mg a day and Elavil 25 mg at night were prescribed at [facility]. She has not been taking them very consistently. Also has had a prescription for Lortab 5 milligrams every six hours as needed for her ankle. Also uses a Pro Air inhaler and albuterol nebulizer.   ALLERGIES: No known drug allergies.   REVIEW OF SYSTEMS: Depressed mood. Some hopelessness. Frequent anxiety. Currently feeling mentally confused. Denies auditory or visual hallucinations. Does have tactile hallucinations. Pain in her ankle and back chronically and shortness of breath.   MENTAL STATUS EXAMINATION: Somewhat disheveled woman who looks her stated age. Cooperative with the interview. Eye contact good. Psychomotor activity a bit fidgety. Speech normal in rate, tone and volume. Affect is anxious and a little reserved. Mood is stated as being depressed. Thoughts are generally lucid. No obvious loosening of associations or delusions. Denies auditory or visual hallucinations, but endorses tactile hallucinations, but has good insight and judgment. Has no acute suicidal intent or plan, but does have some passive suicidal ideation. No homicidal ideation. Baseline intelligence appears normal, judgment and insight adequate.   PHYSICAL EXAMINATION: Overweight woman who looks like she is feeling physically uncomfortable. She does have a scar on her left ankle where she has recently had surgery.  It does not necessarily look infected, but it also looks like it is broken down in a couple of places with scabs, like she has not been taking good care of it. Old scars as well but no other acute skin lesions. Pupils are equal and reactive.  Face symmetric. Oral mucosa dry. Neck and back somewhat stiff, nontender to direct palpation. Gait is a bit of a limp. Full range of motion at all extremities. Strength and reflexes normal throughout. Cranial nerves symmetric. Lungs show diffuse loud wheezing. Heart regular rate and rhythm. Abdomen soft and nontender, normal bowel sounds. Vital signs show temperature 97.9, pulse 81, respirations 20 and blood pressure 104/64.   ASSESSMENT: This is a 39 year old woman who presents with alcohol and cocaine dependence, a history of delirium tremens, depression, suicidal ideation and possible beginnings of delirium. Needs hospitalization for medical stabilization and resumption of substance abuse treatment for safety.   TREATMENT PLAN: Admit to psychiatry. Detox orders in place. Education and support regarding detox and substance abuse treatment. Also restart medications for her asthma and ask for a medical consult. Engage the patient in individual and group daily psychotherapy. Monitor closely for signs of worsening detox. Discuss longer-term options as she becomes more physically stable.   DIAGNOSIS PRINCIPLE AND PRIMARY:   AXIS I: Alcohol dependence.   SECONDARY DIAGNOSES:   AXIS I:  1. Cocaine dependence. 2. Depression, not otherwise specified.   AXIS II: Deferred.   AXIS III: Asthma, chronic pain, acute ankle pain.   AXIS IV: Severe from lack of primary support.   AXIS V: Functioning at time of evaluation 30.  ____________________________ Gonzella Lex, MD jtc:slb D: 01/26/2012 23:08:00 ET (Entered as incorrect work type - 03) T: 01/27/2012 11:19:15 ET JOB#: [id number]  cc: Gonzella Lex, MD, <Dictator> Gonzella Lex MD ELECTRONICALLY SIGNED 01/29/2012 10:00

## 2014-06-02 NOTE — Consult Note (Signed)
Brief Consult Note: Diagnosis: COPD, stable, hypothyroidism, R fklank pain, ?UTi, ETOH hepatitis, tobacco abuase, depression/anxiety.   Patient was seen by consultant.   Consult note dictated.   Recommend further assessment or treatment.   Orders entered.   Comments: 1. COPD, continune advair, advance dose, add duonebs  2. ac bronchitis,start levaquin po, get sputum cx 3. hypothyroidism, start low dose synthroid 4. RE flank pain, ? nephrolithiasis, get CT of abd/pelvis witghout contrast 5 UTI?, get cx, starting levaquin 6. ETOH hepatitis, following 7 tobacco abuse, d/w p;t for 3 min, nicotine replacement is initiated.  Electronic Signatures: Theodoro Grist (MD)  (Signed 15-Dec-13 15:00)  Authored: Brief Consult Note   Last Updated: 15-Dec-13 15:00 by Theodoro Grist (MD)

## 2014-06-02 NOTE — Consult Note (Signed)
Brief Consult Note: Diagnosis: polysubstance dependence and depression.   Recommend further assessment or treatment.   Orders entered.   Comments: Psychiatry: Chart reviewed. Discussed with intake nurse. Patient needs admission due to sub abuse and suicidal ideation. Orders done.  Electronic Signatures: Isaiahs Chancy, Madie Reno (MD)  (Signed 13-Dec-13 13:59)  Authored: Brief Consult Note   Last Updated: 13-Dec-13 13:59 by Gonzella Lex (MD)

## 2014-06-02 NOTE — Discharge Summary (Signed)
PATIENT NAME:  [name], [name] MR#:  [id number] DATE OF BIRTH:  02-15-1975  DATE OF ADMISSION:  01/26/2012 DATE OF DISCHARGE:  01/29/2012  HOSPITAL COURSE: See dictated history and physical for details of admission. This 39 year old woman with a history of alcohol dependence was admitted to the hospital for detox and stabilization of mood and medical complaints. The patient was treated with the CIWA protocol and tolerated it well. She did not have any seizures or sign of delirium. She was somewhat sedated for a couple of days but came through the detox without complication. The patient did cooperate with education and substance abuse programming. She requested to go to [facility] which was her previous treatment choice. At discharge the plan was for her to go to [facility] for follow-up psychiatric treatment. The patient was seen by the internal medicine consultants while she was in the hospital and was treated for her COPD as well as stabilized on her thyroid medication. She is on Levaquin 500 mg a day also for possible upper respiratory infection. The patient did not engage in any dangerous or suicidal behavior. At the time of discharge, she is denying suicidal ideation and she is more physically stable with good insight.   MENTAL STATUS EXAM AT DISCHARGE: Casually dressed and groomed woman who looks her stated age. Cooperative with the interview. Good eye contact, normal psychomotor activity. Speech normal rate, tone and volume. Affect slightly blunted. Mood stated as being okay. Thoughts are lucid without loosening of associations or delusions. Denies auditory or visual hallucinations. Denies suicidal or homicidal ideation. Shows improved judgment and insight, normal intelligence, and is alert and oriented x4.   DISCHARGE MEDICATIONS: 1. Atrovent oral inhaler 2 puffs 4 times daily. 2. Albuterol oral inhaler 2 puffs q. 4 hours p.r.n.  3. Amitriptyline 25 mg at bedtime.  4. Abilify 2 mg daily.   5. Trazodone 150 mg at bedtime.  6. Advair Diskus 500/50 inhaler one puff inhalation twice a day.  7. Synthroid 25 mcg per a.m.  8. Levofloxacin (Levaquin) 500 mg a day for 7 more days.  9. Prozac 40 mg per day.   LABORATORY RESULTS: Admission labs showed a TSH of 5.89. Drug screen positive for benzodiazepines and cocaine. Alcohol undetectable. Chemistry panel with slightly elevated AST at 55, glucose elevated at 112. CBC showed a slightly low hemoglobin at 11.8. Urine culture showed mixed organisms. Urinalysis was borderline for infection. Pregnancy test negative.   DISPOSITION: Discharge follow-up with [facility].    DIAGNOSIS PRINCIPLE AND PRIMARY:   AXIS I: Alcohol dependence.   SECONDARY DIAGNOSES:  AXIS I:  1. Cocaine dependence. 2. Depression, not otherwise specified.   AXIS II: Deferred.   AXIS III: Chronic obstructive pulmonary disease, upper respiratory infection, rule out urinary tract infection and hypothyroidism.   AXIS IV: Moderate to severe from chronic support difficulties.   AXIS V: Functioning at time of discharge 55. ____________________________ Gonzella Lex, MD jtc:sb D: 02/13/2012 11:08:00 ET T: 02/13/2012 11:31:55 ET JOB#: [id number]  cc: Gonzella Lex, MD, <Dictator> Gonzella Lex MD ELECTRONICALLY SIGNED 02/13/2012 12:00

## 2014-06-05 NOTE — Consult Note (Signed)
PATIENT NAME:  [name], [name] MR#:  [id number] DATE OF BIRTH:  [birth year]  DATE OF CONSULTATION:  01/28/2012  PRIMARY CARE PHYSICIAN: Unknown.  REFERRING PHYSICIAN: Dr. Weber Cooks in regards for COPD management.  CONSULTING PHYSICIAN:  Theodoro Grist, MD  HISTORY OF PRESENT ILLNESS: The patient is a 39 year old Caucasian female with past medical history significant for history of asthma, anemia, chronic obstructive pulmonary disease, emphysema, who also has a history of depression, suicidal ideation, also tobacco and alcohol abuse, and crack cocaine abuse, who presented to the hospital when she was found to have suicidal ideation and was admitted to behavioral medicine on 01/25/2012. Consult was requested as the patient  was having some shortness of breath as well as cough and for COPD management. The patient herself tells me that she has been having problems with chills, feeling tired and also having some pains in the right flank area and lower back. She also admits to having some weight gain. Over the past 1 year, she gained at least 50 pounds and now since she has had significant alcohol abuse problems, she lost approximately 20 pounds. She admits to cough as well as wheezing and getting, especially in the mornings, green and thick phlegm. She has been short of breath, especially on exertion. She has been also having some chest pains, which she describes as sharp pain, intermittent, increasing with cough as well as walking around and it happened last night. She felt also presyncopal at some point about a week ago. She has been also dry heaving and having intermittent black stools apparently last week. Because of this discomfort, her psychiatrist, Dr. Bary Leriche as well as Dr. Weber Cooks decided to get medical services for consultation.   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   MEDICATIONS IN THE HOSPITAL: As follows: The patient is here on clonidine, haloperidol, lorazepam, Phenergan, Tylenol, albuterol inhaler, milk of magnesia, simethicone, amitriptyline, Prozac, folic acid, ipratropium inhaler, magnesium hydroxide suspension as needed, magnesium oxide tablet, multivitamins, nicotine oral inhaler, thiamine, Abilify, trazodone, Advair Diskus. At home, apparently she is on Elavil, Prozac, trazodone, Lortab, Pro-Air, albuterol as well as ipratropium nebulizers and Advair.   PAST SURGICAL HISTORY: As above, plus back surgery approximately 15 years ago.   ALLERGIES: PERCOCET.   The patient also tells me that she had kidney stones in the past approximately 9 years ago.   FAMILY HISTORY: Hypertension in family members also stroke and diabetes mellitus in the patient's maternal grandmother and maternal aunts. Also, breast carcinoma in maternal aunt.   SOCIAL HISTORY: The patient has one son who is 65 years old. She lost custody of this son. Now, he lives with the patient's mother. She has smoked approximately 1-1/2 packs per day since early age. Drinks up to 18-pack of alcohol since a young age. She also uses cocaine since the age of 28.  REVIEW OF SYSTEMS:  CONSTITUTIONAL: Positive for feeling chills also feeling tired, pains in lower back and pains in the right flank going down to the right groin. Some weight gain, initially 50 pounds up and now 20 pounds down. Feeling lightheaded, dizzy, feeling presyncopal. Having cough, wheezes, shortness of breath, phlegm production with greenish phlegm, thick phlegm, chest pains intermittently last night. She has  some chest pains, cough as well as dry heaving, black stools intermittently. Otherwise, denies any high fevers, fatigue, weakness or weight gain.   EYES: Denies any blurry vision, double vision, glaucoma or cataracts.  ENT: Denies any tinnitus, allergies, epistaxis, sinus pain, dentures, difficulty swallowing. RESPIRATORY: Denies any hemoptysis. Admits to asthma. Denies _____ respirations. CARDIOVASCULAR: Denies any orthopnea, edema, arrhythmias, palpitations. Admits to feeling a bit syncopal intermittently. GASTROINTESTINAL: Denies any nausea, vomiting now, hematemesis, rectal bleeding, or change in bowel habits recently.  GENITOURINARY: Denies dysuria, hematuria, frequency, or incontinence.  ENDOCRINE: Denies polydipsia, nocturia, thyroid problems, heat or cold intolerance or thirst. HEMATOLOGICAL: Denies anemia, easy bruising, bleeding or swollen glands.  SKIN: Denies any acne, rash, or change in moles.  MUSCULOSKELETAL: Denies arthritis, cramps, swelling.  NEUROLOGIC: Denies numbness, epilepsy or tremor.  PSYCHIATRY: Denies anxiety or insomnia.   PHYSICAL EXAMINATION:  VITAL SIGNS: Temperature 97.9, pulse 94, respirations 20, blood pressure 104/64, saturation was 99% on room air.  GENERAL: This is a well-developed, well-nourished, obese, Caucasian female in no acute distress, sitting on the stretcher.  HEENT: Her pupils are equal and reactive to light. Extraocular movements intact. There is normal hearing. No pharyngeal erythema. Mucosa is moist. NECK: Nontender, no masses, supple. Thyroid is not enlarged. No adenopathy. No JVD or carotid bruits bilaterally. Full range of motion.  LUNGS: Clear to auscultation in all fields. A few rhonchi were heard. A few rales. No evidence of wheezing. _____ breath sounds; however if the patient breaths though her nose she has significantly diminished breath sounds. When she is asked to open her mouth and breath through it, you can hear lung sounds quite well. No wheezing. No labored respirations. No increased effort. No _____ to percussion. ______.  CARDIOVASCULAR: S1, S2 appreciated. No murmurs, rubs,  or gallops noted. PMI not lateralized. Chest is nontender to palpation. ____. No lower extremity edema. No calf tenderness or cyanosis was noted.  ABDOMEN: Soft, tender in the epigastric area as well as right upper quadrant, but no rebound or guarding were noted. No hepatosplenomegaly or masses were noted.  RECTAL: Deferred. The patient's CVA on the right was also tender and somewhat discomfort and painful on palpation also in the right lower quadrant. Again, no rebound or guarding was noted. MUSCULOSKELETAL: Muscle strength: Able to move all extremities. No cyanosis, degenerative joint disease or kyphosis.   SKIN: Skin did not reveal any rashes, lesions, erythema, nodularity, or induration. It was warm and dry to palpation.  LYMPH: No adenopathy in the cervical region.  NEUROLOGICAL: Cranial nerves grossly intact. Sensory is intact. No dysarthria or aphasia. The patient is alert, oriented to time, person and place, cooperative. Memory is good.  PSYCHIATRIC: No significant confusion, agitation, or depression noted.   LABORATORY DATA: Done on 01/25/2012 showed glucose 112. Alcohol level less than 3. Liver enzymes showed AST elevation to 55, otherwise unremarkable. TSH was elevated at 9.87, repeated TSH 14. _____ also elevated at 5.89. Urine drug screen was positive for benzodiazepines as well as cocaine, otherwise negative. The patient's CBC, white blood cell count 9.4, hemoglobin 11.8, platelet count 335. Absolute neutrophil count was not checked.  Urinalysis: Yellow, hazy. Urine negative for glucose or bilirubin, trace ketones, specific gravity 1.013, pH 5.0, 1+ blood, negative for protein and nitrites, 1+ leukocyte esterase, 1 red blood cell, two white blood cells, trace bacteria, 3 epithelial cells and mucous was present.   RADIOLOGY STUDIES: Not done.   ASSESSMENT AND PLAN:  1.  Chronic obstructive pulmonary disease, chronic,  seems to be stable. Continue patient on Advair advance dose. Add DuoNeb.   2.  Acute bronchitis: Start the patient on Levaquin p.o. Get sputum cultures.  3.  Hypothyroidism: Restart low dose of Synthroid.  4.  Right flank pain as well as right lower quadrant pain, questionable nephrolithiasis: Get CT of abdomen and pelvis without contrast.  5.  Urinary tract infection: Questionable. Get urine cultures. Start Levaquin. Follow culture results.  6.  Alcoholic hepatitis: We will follow liver testing in the morning. Check also pro time.  7.  Tobacco abuse: Discussed with the patient for approximately 3 minutes. Nicotine replacement is already initiated.   TIME SPENT: 50 minutes on the patient.     ____________________________ Theodoro Grist, MD rv:aw D: 01/28/2012 15:11:00 ET T: 01/29/2012 11:23:58 ET JOB#: [id number]  cc: Gonzella Lex, MD Theodoro Grist, MD, <Dictator> [place] MD ELECTRONICALLY SIGNED 02/14/2012 20:36

## 2014-06-06 NOTE — Discharge Summary (Signed)
Dates of Admission and Diagnosis:  Date of Admission 10-May-2013   Date of Discharge 16-May-2013   Admitting Diagnosis COPD exacerbation   Final Diagnosis 1. COPD Exacerbation 2. Depression 3. Hypomagnesemia 4. Hypothyroidism.   Discharge Diagnosis 1 Acute on chronic respiratory failure    Chief Complaint/History of Present Illness CHIEF COMPLAINT: Shortness of breath and wheezing.   HISTORY OF PRESENT ILLNESS: This is a 39 year old female who presents to the hospital from her work due to shortness of breath, wheezing. The patient was recently hospitalized just a few days back, discharged on the 24th of March, for similar symptoms. At that time, she was still having some acute symptoms, but demanded to leave prematurely. She now comes back in with the recurrent symptoms. She was at work when her symptoms started to get worse. She attempted to use her nebulizer and albuterol inhaler, but her symptoms did not improve, and therefore, she was sent over to the ER for further evaluation. The patient was noted to be in severe respiratory distress, and at that time, put on BiPAP. Presently, she is talking in full sentences, does have audible wheezing and is noted to be in mild respiratory distress. She likely has underlying COPD exacerbation, and therefore, hospitalist services were contacted for further treatment and evaluation.   Allergies:  No Known Allergies:   Routine Chem:  29-Mar-15 03:41   Creatinine (comp) 0.64  eGFR (African American) >60  eGFR (Non-African American) >60 (eGFR values <80m/min/1.73 m2 may be an indication of chronic kidney disease (CKD). Calculated eGFR is useful in patients with stable renal function. The eGFR calculation will not be reliable in acutely ill patients when serum creatinine is changing rapidly. It is not useful in  patients on dialysis. The eGFR calculation may not be applicable to patients at the low and high extremes of body sizes, pregnant women,  and vegetarians.)  Glucose, Serum  143  BUN 12  Sodium, Serum 136  Potassium, Serum 4.1  Chloride, Serum 106  CO2, Serum 27  Calcium (Total), Serum 8.7  Anion Gap  3  Osmolality (calc) 274  31-Mar-15 15:05   Magnesium, Serum  1.7 (1.8-2.4 THERAPEUTIC RANGE: 4-7 mg/dL TOXIC: > 10 mg/dL  -----------------------)  02-Apr-15 06:29   Creatinine (comp) 0.71  eGFR (African American) >60  eGFR (Non-African American) >60 (eGFR values <652mmin/1.73 m2 may be an indication of chronic kidney disease (CKD). Calculated eGFR is useful in patients with stable renal function. The eGFR calculation will not be reliable in acutely ill patients when serum creatinine is changing rapidly. It is not useful in  patients on dialysis. The eGFR calculation may not be applicable to patients at the low and high extremes of body sizes, pregnant women, and vegetarians.)  Urine Drugs:  [id number] Tricyclic Antidepressant, Ur Qual (comp) NEGATIVE (Result(s) reported on 13 May 2013 at 07:09PM.)  Amphetamines, Urine Qual. NEGATIVE  MDMA, Urine Qual. NEGATIVE  Cocaine Metabolite, Urine Qual. NEGATIVE  Opiate, Urine qual NEGATIVE  Phencyclidine, Urine Qual. NEGATIVE  Cannabinoid, Urine Qual. NEGATIVE  Barbiturates, Urine Qual. NEGATIVE  Benzodiazepine, Urine Qual. NEGATIVE (----------------- The URINE DRUG SCREEN provides only a preliminary, unconfirmed analytical test result and should not be used for non-medical  purposes.  Clinical consideration and professional judgment should be  applied to any positive drug screen result due to possible interfering substances.  A more specific alternate chemical method must be used in order to obtain a confirmed analytical result.  Gas chromatography/mass spectrometry (GC/MS) is the preferred  confirmatory method.)  Methadone, Urine Qual. NEGATIVE  Routine Hem:  29-Mar-15 03:41   WBC (CBC) 7.6  RBC (CBC) 4.34  Hemoglobin (CBC)  11.8  Hematocrit (CBC) 36.1   Platelet Count (CBC) 243  MCV 83  MCH 27.2  MCHC 32.7  RDW  15.6  Neutrophil % 82.3  Lymphocyte % 13.8  Monocyte % 3.8  Eosinophil % 0.0  Basophil % 0.1  Neutrophil # 6.3  Lymphocyte # 1.0  Monocyte # 0.3  Eosinophil # 0.0  Basophil # 0.0 (Result(s) reported on 11 May 2013 at 04:58AM.)  31-Mar-15 04:49   WBC (CBC)  12.0  RBC (CBC) 4.40  Hemoglobin (CBC)  11.6  Hematocrit (CBC) 36.2  Platelet Count (CBC) 264  MCV 82  MCH 26.4  MCHC 32.1  RDW  15.3  Neutrophil % 72.4  Lymphocyte % 20.3  Monocyte % 7.2  Eosinophil % 0.0  Basophil % 0.1  Neutrophil #  8.7  Lymphocyte # 2.4  Monocyte # 0.9  Eosinophil # 0.0  Basophil # 0.0 (Result(s) reported on 13 May 2013 at 06:21AM.)   PERTINENT RADIOLOGY STUDIES: XRay:    23-Mar-15 22:53, Chest PA and Lateral  Chest PA and Lateral   REASON FOR EXAM:    diff breathing  COMMENTS:       PROCEDURE: DXR - DXR CHEST PA (OR AP) AND LATERAL  - May 05 2013 10:53PM     CLINICAL DATA:  Respiratory distress.    EXAM:  CHEST  2 VIEW    COMPARISON:  Chest x-ray 12/05/2012.    FINDINGS:  Diffuse bronchial wall thickening with patchy interstitial and  airspace opacities throughout the lung bases bilaterally. No pleural  effusions. No evidence of pulmonary edema. Heart size is normal.  Mediastinal contours are unremarkable.     IMPRESSION:  1. Diffuse bronchial wall thickening with patchy bibasilar  interstitial and airspace disease which may reflect sequela of  recent aspiration or developing multilobar bronchopneumonia.      Electronically Signed    By: Vinnie Langton [name]    On: 05/05/2013 23:04         Verified By: Etheleen Mayhew, [name],   Pertinent Past History:  Pertinent Past History COPD Chronic resp failure   Hospital Course:  Hospital Course 39 yo female w/ hx of asthma/copd, hypothyroidism, depression came into hospital due to shortness of breath, wheezing, cough and noted to be in COPD Exacerbation.    1. COPD Exacerbation - likely due to acute bronchitis. Pt. was recently hospitalized but demanded to leave prematurely and now comes back in w/ similar symptoms.   - much improved since admission and has minimal end-exp. wheezing. Weaned off [place] and now on [place] at 3 L.   - On IV steroids - cont. Pulmocort Nebs.  - cont. Ceftriaxone, Zithromax and switch to Oral Levaquin upon discharge.  - already on Home O2.    2. Hypothyroidism - cont. synthroid.   3. Depression - cont. Prozac.  will d/c home tomorrow on Oral Prednisone taper and empiric abx.   Today on exam  Lungs-Mild weezing. A.O times 3. No edema. S1, S2  TIme spent today on d/c 35 min   Condition on Discharge Stable   Code Status:  Code Status Full Code   DISCHARGE INSTRUCTIONS HOME MEDS:  Medication Reconciliation: Patient's Home Medications at Discharge:     Medication Instructions  acetaminophen-hydrocodone 325 mg-5 mg oral tablet  1 tab(s) orally every 6 hours, As Needed as  needed for pain   prozac 40 mg oral capsule  1 cap(s) orally once a day Indications:Depression/OCD/PMDD   albuterol oral inhaler  2 puff(s) inhaled every 4 hours while awake, As Needed - Indication: Bronchodilator   synthroid 100 mcg (0.1 mg) oral tablet  1 tab(s) orally once a day   albuterol-ipratropium 2.5 mg-0.5 mg/3 ml inhalation solution  3 milliliter(s) inhaled every 6 hours, As Needed - for Shortness of Breath - for Wheezing    prednisone 10 mg oral tablet  6 tab(s) orally once a day x 1 daystab(s) orally once a day x 1 daystab(s) orally once a day x 1 daystab(s) orally once a day x 1 daystab(s) orally once a day x 1 daystab(s) orally once a day x 1 days   tiotropium 18 mcg inhalation capsule  1 cap(s) inhaled once a day   advair diskus 250 mcg-50 mcg inhalation powder  1 puff(s) inhaled 2 times a day   doxycycline monohydrate 100 mg oral tablet  1 tab(s) orally 2 times a day x 5 days   aclidinium bromide 400 mcg/inh inhalation  powder  400 microgram(s) inhaled 2 times a day   ventolin hfa 90 mcg/inh inhalation aerosol  2 puff(s) inhaled 4 times a day, As Needed - for Shortness of Breath    STOP TAKING THE FOLLOWING MEDICATION(S):    levaquin 500 mg oral tablet: 1 tab(s) orally once a day x 8 days amoxicillin-clavulanate 875 mg-125 mg oral tablet: 1 tab  orally 2 times a day x 8 days  Physician's Instructions:  Home Health? No   Home Oxygen? Yes   Portable Tank? Yes   Oxygen delivery at home: 2L   Diet Regular   Activity Limitations As tolerated   Return to Work Not Applicable   Time frame for Follow Up Appointment 1-2 weeks   Electronic Signatures: Durrel Mcnee, Lottie Dawson (MD)  (Signed 03-Apr-15 10:03)  Authored: ADMISSION DATE AND DIAGNOSIS, CHIEF COMPLAINT/HPI, Allergies, PERTINENT LABS, PERTINENT RADIOLOGY STUDIES, [place], PATIENT INSTRUCTIONS   Last Updated: 03-Apr-15 10:03 by Alba Destine (MD)

## 2014-06-06 NOTE — H&P (Signed)
PATIENT NAME:  [name], [name] MR#:  [id number] DATE OF BIRTH:  03/24/1975  DATE OF ADMISSION:  05/10/2013  PRIMARY CARE PHYSICIAN: Does not have one at the [facility].   CHIEF COMPLAINT: Shortness of breath and wheezing.   HISTORY OF PRESENT ILLNESS: This is a 39 year old female who presents to the hospital from her work due to shortness of breath, wheezing. The patient was recently hospitalized just a few days back, discharged on the 24th of March, for similar symptoms. At that time, she was still having some acute symptoms, but demanded to leave prematurely. She now comes back in with the recurrent symptoms. She was at work when her symptoms started to get worse. She attempted to use her nebulizer and albuterol inhaler, but her symptoms did not improve, and therefore, she was sent over to the ER for further evaluation. The patient was noted to be in severe respiratory distress, and at that time, put on BiPAP. Presently, she is talking in full sentences, does have audible wheezing and is noted to be in mild respiratory distress. She likely has underlying COPD exacerbation, and therefore, hospitalist services were contacted for further treatment and evaluation.   REVIEW OF SYSTEMS:  CONSTITUTIONAL: No documented fever. No weight gain, no weight loss.  EYES: No blurry or double vision.  EARS, NOSE, THROAT: No tinnitus. No postnasal drip. No redness of the oropharynx.  RESPIRATORY: Positive cough. Positive wheeze. Positive COPD. No hemoptysis. Positive dyspnea.  CARDIOVASCULAR: Positive chest pain with the cough. No orthopnea, no palpitations, no syncope.  GASTROINTESTINAL: No nausea. No vomiting. No diarrhea. No abdominal pain. No melena or hematochezia.  GENITOURINARY: No dysuria or hematuria.  ENDOCRINE: No polyuria or nocturia. No heat or cold intolerance.  HEMATOLOGIC: No anemia, no bruising, no bleeding.  INTEGUMENTARY: No rashes or lesions.  MUSCULOSKELETAL: No arthritis. No  swelling. No gout.  NEUROLOGIC: No numbness or tingling. No ataxia. No seizure-type activity.  PSYCHIATRIC: Positive depression. No anxiety. No ADD.   PAST MEDICAL HISTORY: Consistent with:  1. Asthma/COPD. 2. Hypothyroidism.  3. Depression.   ALLERGIES: No known drug allergies.   SOCIAL HISTORY: Used to smoke about a pack per day, quit about 6 months ago. Does have a 20-pack-year smoking history. Occasional alcohol use. No illicit drug abuse. Lives at home with her mother.   FAMILY HISTORY: Both mother and father are alive. She does not recall much medical history on her father's side. Mother has thyroid problems.   CURRENT MEDICATIONS: As follows:  1. Norco 1 tab q.6 hours as needed.  2. Albuterol inhaler 2 puffs q.4 hours as needed. 3. DuoNebs q.6 hours as needed. 4. Prozac 40 mg daily. 5. Synthroid 100 mcg daily.  6. The patient was also finishing up a prednisone taper and oral Levaquin from her recent hospitalization.   PHYSICAL EXAMINATION: Presently, is as follows:  VITAL SIGNS: Noted to be: Temperature is 99.2, pulse 88, respirations 20, blood pressure 125/65, saturations 100% on 4 liters nasal cannula.  GENERAL: She is a pleasant-appearing female in mild respiratory distress.  HEAD, EYES, EARS, NOSE AND THROAT: Atraumatic, normocephalic. Extraocular muscles are intact. Pupils are equal and reactive to light. Sclerae anicteric. No conjunctival injection. No pharyngeal erythema.  NECK: Supple. There is no jugular venous distention. No bruits. No lymphadenopathy. No thyromegaly.  HEART: Regular rate and rhythm. No murmurs, no rubs, no clicks.  LUNGS: She has coarse wheezing and rhonchi diffusely. Positive use of accessory muscles. No dullness to percussion.  ABDOMEN: Soft, flat and  nontender, nondistended. Has good bowel sounds. No hepatosplenomegaly appreciated.  EXTREMITIES: No evidence of any cyanosis, clubbing or peripheral edema. Has +2 pedal and radial pulses bilaterally.   NEUROLOGICAL: The patient is alert, awake and oriented x3, with no focal motor or sensory deficits appreciated bilaterally.  SKIN: Moist and warm, with no rashes appreciated.  LYMPHATIC: There is no cervical or axillary lymphadenopathy.   LABORATORY DATA: Shows a serum glucose of 89, BUN 13, creatinine 1.03, sodium 138, potassium 2.8, chloride 104, bicarbonate 27. LFTs are within normal limits. Troponin less than 0.02. TSH of 20.5. White cell count 9.1, hemoglobin 13.5, hematocrit 42, platelet count 257.   ASSESSMENT AND PLAN: This is a 39 year old female with history of asthma, chronic obstructive pulmonary disease, hypothyroidism, depression, who was recently hospitalized for chronic obstructive pulmonary disease exacerbation, who presents to the hospital with shortness of breath, wheezing and cough, noted to be in chronic obstructive pulmonary disease exacerbation.   1. Chronic obstructive pulmonary disease exacerbation. This is likely secondary to acute bronchitis. The patient was recently hospitalized but demanded to leave prematurely and now comes back in with similar symptoms. For now, will treat her with around-the-clock IV steroids, nebulizer treatments. Will start her on some Symbicort. Will also add Pulmicort nebulizers. Also give empiric antibiotics with ceftriaxone and Zithromax and follow her clinically. She is already on oxygen at home.  2. Hypothyroidism. Continue Synthroid.   3. Depression. Continue Prozac.  4. Hypokalemia. Will place her on oral potassium supplements and repeat her potassium in the morning.   CODE STATUS: The patient is a full code.   TIME SPENT ON ADMISSION: 50 minutes.    ____________________________ Belia Heman. Verdell Carmine, MD vjs:lb D: 05/10/2013 09:58:26 ET T: 05/10/2013 10:19:28 ET JOB#: [id number]  cc: Belia Heman. Verdell Carmine, MD, <Dictator> Henreitta Leber MD ELECTRONICALLY SIGNED 05/14/2013 14:53

## 2014-06-06 NOTE — H&P (Signed)
PATIENT NAME:  [name], [name] MR#:  [id number] DATE OF BIRTH:  July 29, 1975  DATE OF ADMISSION:  05/06/2013  PRIMARY CARE PHYSICIAN: None.  REFERRING PHYSICIAN: Eula Listen, MD  CHIEF COMPLAINT: Cough, shortness of breath.   HISTORY OF PRESENT ILLNESS: [name] is a 39 year old female with history of asthma, COPD,  anxiety, depression. Presented to the Emergency Department with complaints of cough, shortness of breath that started since yesterday. It started as a cough which worsened. Was experiencing subjective fevers. Had we cough. Unable to produce sputum. Concerning this, came to the Emergency Department. Work up in the Emergency Department with chest x-ray shows diffuse peribronchial thickening with patchy bibasilar air space opacity. Concerning about developing multilobar pneumonia. The patient received multiple breathing treatments in the Emergency Department, as well as Solu-Medrol.   PAST MEDICAL HISTORY:  1.  Asthma/COPD. 2.  Anxiety, depression.  3.  Hypothyroidism.   PAST SURGICAL HISTORY: 1.   C-section.  2.  D and C.  3.  Back surgery.   ALLERGIES: No known drug allergies.   HOME MEDICATIONS:  1.  Synthroid 25 mcg once a day.  2.  Prozac 40 mg once daily.  3.  Ibuprofen 800 mg 3 times a day.  4.  Symbicort 1 puff 2 times a day.   5.  Albuterol 2 puffs every 4 hours as needed.  6.  Acetaminophen 1 tablet every 6 hours as needed.   SOCIAL HISTORY: Smoked 1 pack a day. Quit 8 months back and drinks alcohol. Denies using any illicit drugs. Lives with her son. Works as a Training and development officer.   FAMILY HISTORY: Heart problems.   REVIEW OF SYSTEMS:  CONSTITUTIONAL: Generalized weakness.  EYES: No change in vision.  ENT: Has a sore throat.  RESPIRATORY: Has cough, shortness of breath.  CARDIOVASCULAR: No chest pain, palpitations.  GASTROINTESTINAL: No nausea, vomiting, abdominal pain.  GENITOURINARY: No dysuria or hematuria.  ENDOCRINE: No polyuria or polydipsia.   HEMATOLOGIC: No easy bruising or bleeding.  SKIN: No rash or lesions.  MUSCULOSKELETAL: No joint pains and aches.  NEUROLOGIC: No weakness or numbness in any part of the body.  PSYCHIATRIC: Anxiety, depression.   PHYSICAL EXAMINATION:  GENERAL: This is a well-built, well-nourished, age-appropriate female lying down in the bed, not in distress.  VITAL SIGNS: Temperature 98.3, pulse 100, blood pressure 107/65, respiratory rate of 26, oxygen saturation is 96% on 2 liters of oxygen.  HEENT: Head: Normocephalic and atraumat Conjunctivae normal. Pupils equal and react to light. Extraocular movements are intact. Mucous membranes moist. Mild pharyngeal erythema. No exudate.  NECK: Supple. No lymphadenopathy. No JVD noted.  CHEST: No focal tenderness. Bilateral diffuse wheezing, rhonchi.  HEART: S1, S2, regular, tachycardia.  ABDOMEN: Bowel sounds present. Soft, nontender, nondistended. No hepatosplenomegaly.  EXTREMITIES: No pedal edema. Pulses 2+.  NEUROLOGIC: The patient is alert, oriented to place, person, and time. Cranial nerves II through XII intact. Motor 5/5 in upper and lower extremities.  SKIN: No rash or lesions. MUSCULOSKELETAL: Good range of motion in all the extremities.   LABORATORIES: BMP is completely within normal limits. Troponin less than 0.02. CBC is completely within normal limits.   Chest x-ray shows diffuse bronchial wall thickening with patchy bibasilar air space opacities with multilobar bronchopneumonia.   ASSESSMENT AND PLAN: [name] is a 39 year old female with known history of asthma, chronic obstructive pulmonary disease. Comes to the Emergency Department with cough, shortness of breath.  1.  Pneumonia. Will treat it as a community-acquired pneumonia with Rocephin and  Zithromax.  2.  Asthma/chronic obstructive pulmonary disease exacerbation. Continue with the breathing treatments and Solu-Medrol.  3.  Hypothyroidism. Continue with Synthroid.  4.  Keep the  patient on deep vein thrombosis prophylaxis with Lovenox.   TIME SPENT: Forty-five minutes.   ____________________________ Monica Becton, MD pv:am D: 05/06/2013 01:10:06 ET T: 05/06/2013 03:22:31 ET JOB#: [id number]  cc: Monica Becton, MD, <Dictator> Monica Becton MD ELECTRONICALLY SIGNED 05/29/2013 0:11

## 2014-06-06 NOTE — Discharge Summary (Signed)
PATIENT NAME:  [name], [name] MR#:  [id number] DATE OF BIRTH:  1975/02/16  DATE OF ADMISSION:  05/06/2013 DATE OF DISCHARGE:  05/06/2013  ADMITTING PHYSICIAN: Monica Becton, MD  DISCHARGING PHYSICIAN: Gladstone Lighter, MD  PRIMARY CARE PHYSICIAN: [facility].   DISCHARGE DIAGNOSES:  1.  Asthma/chronic obstructive pulmonary disease exacerbation. 2.  Multilobar pneumonia.  3.  Hypothyroidism.  4.  Depression and anxiety.  DISCHARGE HOME MEDICATIONS: 1.  Synthroid 100 mcg p.o. daily.  2.  Norco 5/325 mg 1 tablet q. 6 hours p.r.n. for pain.  3.  Prozac 40 mg p.o. daily.  4.  Albuterol inhaler 2 puffs q. 4 hours p.r.n. for wheezing.  5.  DuoNebs with albuterol and ipratropium 3 mL q. 6 hours p.r.n. for wheezing.  6.  Levaquin 500 mg p.o. daily for 8 more days.  7.  Augmentin 875 mg 1 tablet p.o. b.i.d. for 8 more days.  8.  Prednisone taper over 12 days.   DISCHARGE HOME OXYGEN: 3 liters.   DISCHARGE DIET: Regular diet.   DISCHARGE ACTIVITY: As tolerated.   FOLLOWUP INSTRUCTIONS: PCP followup in 1 week.   LABS AND IMAGING STUDIES: Blood cultures on admission remained negative.   Chest x-ray showing diffuse bronchial wall thickening with patchy bibasilar interstitial and airspace disease reflecting sequela of recent aspiration or developing multilobar bronchopneumonia.   WBC is 5.2, hemoglobin 12.3, hematocrit 38.3, platelet count 223,000.   Sodium 137, potassium 4.1, chloride 108, bicarb 21, BUN 6, creatinine 0.65, glucose 76 and calcium 8.6.  BRIEF HOSPITAL COURSE: Ms. Oconnor is a 39 year old Caucasian female with past medical history of chronic respiratory failure secondary to COPD and asthma, on 3 liters of home oxygen at baseline, presents with difficulty breathing, noted to have significant wheezing and chest x-ray with possible multilobar pneumonia.   Acute on chronic obstructive pulmonary disease exacerbation with multilobar pneumonia: Started on IV  steroids, nebulizer treatments, inhalers, and also IV antibiotics with Rocephin and azithromycin. The patient states she recently recovered from pneumonia about a month ago. The patient was very dead set on going home even though she does have wheezing on exam. The patient states she has home oxygen and nebs at home. She has always wheezing at baseline and she did not want to stay in the hospital. She was warned about readmission chances, but she was pretty adamant and is being discharged home on prednisone taper and also p.o. antibiotics. She will be sent on dual antibiotic treatment because of her recent pneumonia and also multilobar pneumonia noted on chest x-ray. She is on Levaquin and Augmentin at discharge and also prednisone taper for her COPD. She is back to 3 liters, which is baseline home oxygen requirement.   Her other home medications are being continued. Her course has been otherwise uneventful.   DISCHARGE CONDITION: Stable.   DISCHARGE DISPOSITION: Home.   TIME SPENT ON DISCHARGE: 40 minutes.   ____________________________ Gladstone Lighter, MD rk:sb D: 05/06/2013 14:27:56 ET T: 05/06/2013 16:01:27 ET JOB#: [id number]  cc: Gladstone Lighter, MD, <Dictator> Gladstone Lighter MD ELECTRONICALLY SIGNED 05/08/2013 16:07

## 2014-06-24 ENCOUNTER — Other Ambulatory Visit: Payer: Self-pay

## 2014-06-24 ENCOUNTER — Emergency Department
Admission: EM | Admit: 2014-06-24 | Discharge: 2014-06-25 | Disposition: A | Discharge status: Home / Self Care | Payer: No Typology Code available for payment source | Attending: Emergency Medicine | Admitting: Emergency Medicine

## 2014-06-24 ENCOUNTER — Encounter: Payer: Self-pay | Admitting: *Deleted

## 2014-06-24 ENCOUNTER — Emergency Department: Payer: No Typology Code available for payment source

## 2014-06-24 DIAGNOSIS — Z79899 Other long term (current) drug therapy: Secondary | ICD-10-CM | POA: Diagnosis not present

## 2014-06-24 DIAGNOSIS — Z7952 Long term (current) use of systemic steroids: Secondary | ICD-10-CM | POA: Diagnosis not present

## 2014-06-24 DIAGNOSIS — J441 Chronic obstructive pulmonary disease with (acute) exacerbation: Secondary | ICD-10-CM | POA: Insufficient documentation

## 2014-06-24 DIAGNOSIS — Z720 Tobacco use: Secondary | ICD-10-CM | POA: Insufficient documentation

## 2014-06-24 DIAGNOSIS — J209 Acute bronchitis, unspecified: Secondary | ICD-10-CM

## 2014-06-24 DIAGNOSIS — R0602 Shortness of breath: Secondary | ICD-10-CM | POA: Diagnosis present

## 2014-06-24 DIAGNOSIS — F17200 Nicotine dependence, unspecified, uncomplicated: Secondary | ICD-10-CM

## 2014-06-24 LAB — BASIC METABOLIC PANEL
ANION GAP: 8 (ref 5–15)
BUN: 5 mg/dL — ABNORMAL LOW (ref 6–20)
CALCIUM: 9 mg/dL (ref 8.9–10.3)
CHLORIDE: 110 mmol/L (ref 101–111)
CO2: 21 mmol/L — AB (ref 22–32)
CREATININE: 0.61 mg/dL (ref 0.44–1.00)
GFR calc non Af Amer: >60 mL/min (ref >60)
Glucose, Bld: 96 mg/dL (ref 65–99)
Potassium: 3.8 mmol/L (ref 3.5–5.1)
SODIUM: 139 mmol/L (ref 135–145)

## 2014-06-24 LAB — CBC
HEMATOCRIT: 35.4 % (ref 35.0–47.0)
Hemoglobin: 11.4 g/dL — ABNORMAL LOW (ref 12.0–16.0)
MCH: 25.4 pg — AB (ref 26.0–34.0)
MCHC: 32.2 g/dL (ref 32.0–36.0)
MCV: 78.9 fL — ABNORMAL LOW (ref 80.0–100.0)
PLATELETS: 215 10*3/uL (ref 150–440)
RBC: 4.49 MIL/uL (ref 3.80–5.20)
RDW: 16.4 % — ABNORMAL HIGH (ref 11.5–14.5)
WBC: 6.5 10*3/uL (ref 3.6–11.0)

## 2014-06-24 MED ORDER — IPRATROPIUM-ALBUTEROL 0.5-2.5 (3) MG/3ML IN SOLN
RESPIRATORY_TRACT | Status: AC
  Filled 2014-06-24: qty 3

## 2014-06-24 MED ORDER — SODIUM CHLORIDE 0.9 % IV BOLUS (SEPSIS)
500.0000 mL | Freq: Once | INTRAVENOUS | Status: AC
  Administered 2014-06-25: 500 mL via INTRAVENOUS

## 2014-06-24 MED ORDER — KETOROLAC TROMETHAMINE 30 MG/ML IJ SOLN
30.0000 mg | Freq: Once | INTRAMUSCULAR | Status: AC
  Administered 2014-06-25: 30 mg via INTRAVENOUS

## 2014-06-24 MED ORDER — METHYLPREDNISOLONE SODIUM SUCC 125 MG IJ SOLR
125.0000 mg | Freq: Once | INTRAMUSCULAR | Status: AC
  Administered 2014-06-25: 125 mg via INTRAVENOUS

## 2014-06-24 MED ORDER — IPRATROPIUM-ALBUTEROL 0.5-2.5 (3) MG/3ML IN SOLN
3.0000 mL | Freq: Once | RESPIRATORY_TRACT | Status: AC
  Administered 2014-06-24: 3 mL via RESPIRATORY_TRACT

## 2014-06-24 MED ORDER — HYDROCOD POLST-CPM POLST ER 10-8 MG/5ML PO SUER
ORAL | Status: AC
Start: 2014-06-24 — End: 2014-06-24
  Administered 2014-06-24: 5 mL via ORAL
  Filled 2014-06-24: qty 5

## 2014-06-24 MED ORDER — KETOROLAC TROMETHAMINE 30 MG/ML IJ SOLN
INTRAMUSCULAR | Status: AC
  Administered 2014-06-25: 30 mg via INTRAVENOUS
  Filled 2014-06-24: qty 1

## 2014-06-24 MED ORDER — IPRATROPIUM-ALBUTEROL 0.5-2.5 (3) MG/3ML IN SOLN
RESPIRATORY_TRACT | Status: AC
  Administered 2014-06-24: 3 mL via RESPIRATORY_TRACT
  Filled 2014-06-24: qty 3

## 2014-06-24 MED ORDER — IPRATROPIUM-ALBUTEROL 0.5-2.5 (3) MG/3ML IN SOLN: 3.0000 mL | Freq: Once | RESPIRATORY_TRACT | Status: DC

## 2014-06-24 MED ORDER — METHYLPREDNISOLONE SODIUM SUCC 125 MG IJ SOLR
INTRAMUSCULAR | Status: AC
  Administered 2014-06-25: 125 mg via INTRAVENOUS
  Filled 2014-06-24: qty 2

## 2014-06-24 MED ORDER — HYDROCOD POLST-CPM POLST ER 10-8 MG/5ML PO SUER
5.0000 mL | Freq: Once | ORAL | Status: AC
Start: 2014-06-25 — End: 2014-06-24
  Administered 2014-06-24: 5 mL via ORAL

## 2014-06-24 NOTE — ED Notes (Signed)
Pt arrives to STAT registration via w/c with congested cough; st SOB/cough with hx of COPD; oxim 97% on ra

## 2014-06-24 NOTE — ED Notes (Signed)
Pt reports a cough since Monday. Pt says her throat feels raw and she coughs until she vomits. The pt says wheezing and sob awoke her this morning from sleep. Has been using her nebulizer at home without relief.

## 2014-06-24 NOTE — ED Provider Notes (Signed)
[facility] Emergency Department Provider Note  ____________________________________________  Time seen: Approximately 11:48 PM  I have reviewed the triage vital signs and the nursing notes.   HISTORY  Chief Complaint Shortness of Breath    HPI [name] is a 39 y.o. female who presents with a 2 day history of productive cough. Patient states cough initially productive of yellow sputum; now reports a dry cough which induces post tussive emesis. Patient complains of a sore throat, wheezing, shortness of breath, and chest pain upon coughing. Patient states she has been using her nebulizer at home without relief of symptoms. Patient denies recent foreign travel. Positive sick contact recently diagnosed with strep throat.  Patient complains of subjective fever and chills, nasal congestion, 8/10 throat pain, chest pain on coughing, cough, and shortness of breath.   Past Medical History  Diagnosis Date  . COPD (chronic obstructive pulmonary disease)   . Asthma   . Depression   . Tobacco abuse   . Alcohol abuse   . Anemia 01/31/2011  . Hypoxia 02/01/2011  . Needs sleep apnea assessment 2013.07.31    Patient Active Problem List   Diagnosis Date Noted  . CAP (community acquired pneumonia) 01/19/2013  . Dehydration 01/19/2013  . Nausea vomiting and diarrhea 01/19/2013  . Alcohol dependence 01/16/2012  . Alcohol withdrawal 01/16/2012  . Hypoxia 02/01/2011  . Hyponatremia 02/01/2011  . Anemia 01/31/2011  . Chest pain, pleuritic 01/29/2011  . Back pain 01/29/2011  . HCAP (healthcare-associated pneumonia) 01/28/2011  . Acute bronchitis 01/08/2011  . COPD exacerbation 01/08/2011  . Tobacco abuse 01/08/2011  . Polysubstance abuse 01/08/2011  . Major depression 01/08/2011    Past Surgical History  Procedure Laterality Date  . Back surgery    . Cesarean section    . Dilation and curettage of uterus    . Ankle ganglion cyst excision       Current Outpatient Rx  Name  Route  Sig  Dispense  Refill  . albuterol (PROVENTIL HFA;VENTOLIN HFA) 108 (90 BASE) MCG/ACT inhaler   Inhalation   Inhale 2 puffs into the lungs every 6 (six) hours as needed for wheezing or shortness of breath.         Marland Kitchen albuterol (PROVENTIL HFA;VENTOLIN HFA) 108 (90 BASE) MCG/ACT inhaler   Inhalation   Inhale 2 puffs into the lungs every 4 (four) hours as needed for wheezing or shortness of breath.   1 Inhaler   0   . albuterol (PROVENTIL) (2.5 MG/3ML) 0.083% nebulizer solution   Nebulization   Take 3 mLs (2.5 mg total) by nebulization every 6 (six) hours as needed for wheezing or shortness of breath. For asthma   75 mL   1   . azithromycin (ZITHROMAX) 500 MG tablet   Oral   Take 1 tablet (500 mg total) by mouth daily. Take in the evening.   2 tablet   0   . budesonide-formoterol (SYMBICORT) 160-4.5 MCG/ACT inhaler   Inhalation   Inhale 2 puffs into the lungs 2 (two) times daily.         . cefUROXime (CEFTIN) 500 MG tablet   Oral   Take 1 tablet (500 mg total) by mouth 2 (two) times daily with a meal.   7 tablet   0   . chlorpheniramine-HYDROcodone (TUSSIONEX) 10-8 MG/5ML SUER   Oral   Take 5 mLs by mouth every 12 (twelve) hours as needed for cough.   70 mL   0   . cyclobenzaprine (  FLEXERIL) 10 MG tablet   Oral   Take 1 tablet (10 mg total) by mouth 3 (three) times daily.   15 tablet   0   . FLUoxetine (PROZAC) 40 MG capsule   Oral   Take 40 mg by mouth every morning. For depression.         . gabapentin (NEURONTIN) 100 MG capsule   Oral   Take 2 capsules (200 mg total) by mouth 3 (three) times daily. For anxiety and neuropathic pain.   180 capsule   0   . levothyroxine (SYNTHROID, LEVOTHROID) 100 MCG tablet   Oral   Take 100 mcg by mouth daily before breakfast.         . predniSONE (DELTASONE) 20 MG tablet   Oral   Take 1 tablet (20 mg total) by mouth daily. Take 3 tablets (60 mg total) by mouth daily 4  days.   12 tablet   0     Allergies Review of patient's allergies indicates no known allergies.  Family History  Problem Relation Age of Onset  . Hypothyroidism Mother   . Coronary artery disease Mother     Social History History  Substance Use Topics  . Smoking status: Current Every Day Smoker -- 1.00 packs/day    Types: Cigarettes    Last Attempt to Quit: 06/19/2012  . Smokeless tobacco: Not on file  . Alcohol Use: 0.0 oz/week     Comment: occ    Review of Systems Constitutional: Positive for subjective fever/chills Eyes: No visual changes. ENT: Positive for sore throat and nasal congestion. Cardiovascular: Positive for chest pain on coughing. Respiratory: Positive for shortness of breath. Gastrointestinal: Positive for post tussive emesis. No abdominal pain.  No nausea.  No diarrhea.  No constipation. Genitourinary: Negative for dysuria. Musculoskeletal: Negative for back pain. Skin: Negative for rash. Neurological: Negative for headaches, focal weakness or numbness.  10-point ROS otherwise negative.  ____________________________________________   PHYSICAL EXAM:  VITAL SIGNS: ED Triage Vitals  Enc Vitals Group     BP 06/24/14 2015 135/81 mmHg     Pulse Rate 06/24/14 2015 108     Resp 06/24/14 2015 22     Temp 06/24/14 2015 99.1 F (37.3 C)     Temp Source 06/24/14 2015 Oral     SpO2 06/24/14 2015 98 %     Weight 06/24/14 2015 204 lb (92.534 kg)     Height 06/24/14 2015 5\' 5"  (1.651 m)     Head Cir --      Peak Flow --      Pain Score 06/24/14 2016 8     Pain Loc --      Pain Edu? --      Excl. in [place]? --     Constitutional: Alert and oriented. Well appearing and in no acute distress. Eyes: Conjunctivae are normal. PERRL. EOMI. Head: Atraumatic. Nose: Mucosal congestion noted bilaterally. Mouth/Throat: Mucous membranes are moist.  Pharyngeal erythema. Neck: No stridor.   Hematological/Lymphatic/Immunilogical: Shotty anterior cervical  lymphadenopathy. Cardiovascular: Normal rate, regular rhythm. Grossly normal heart sounds.  Good peripheral circulation. Respiratory: Normal respiratory effort.  No retractions. Lungs slightly diminished with mild wheezing bilaterally. Anterior chest wall tender to palpation. Gastrointestinal: Soft and nontender. No distention. No abdominal bruits. No CVA tenderness. Musculoskeletal: No lower extremity tenderness nor edema.  No joint effusions. Negative Homans sign bilaterally. Neurologic:  Normal speech and language. No gross focal neurologic deficits are appreciated. Speech is normal. No gait instability. Skin:  Skin  is warm, dry and intact. No rash noted. Psychiatric: Mood and affect are normal. Speech and behavior are normal.  ____________________________________________   LABS (all labs ordered are listed, but only abnormal results are displayed)  Labs Reviewed  CBC - Abnormal; Notable for the following:    Hemoglobin 11.4 (*)    MCV 78.9 (*)    MCH 25.4 (*)    RDW 16.4 (*)    All other components within normal limits  BASIC METABOLIC PANEL - Abnormal; Notable for the following:    CO2 21 (*)    BUN 5 (*)    All other components within normal limits  POCT RAPID STREP A ([facility] CARE ONLY)  POCT RAPID STREP A ([facility] CARE ONLY)   ____________________________________________  EKG  ED ECG REPORT   Date: 06/24/2014  EKG Time: 2027  Rate: 108  Rhythm: sinus tachycardia  Axis: Normal  Intervals:none  ST&T Change: Nonspecific  ____________________________________________  RADIOLOGY  Chest 2 view reviewed by me, interpreted per Dr. Gerilyn Nestle: No active cardiopulmonary disease. ____________________________________________   PROCEDURES  Procedure(s) performed: None  Critical Care performed: No  ____________________________________________   INITIAL IMPRESSION / ASSESSMENT AND PLAN / ED COURSE  Pertinent labs & imaging results that were available during my care of  the patient were reviewed by me and considered in my medical decision making (see chart for details).  39 year old female with history of COPD with acute viral bronchitis. Will obtain rapid strep given patient's sick contacts. Plan for IV fluids, Solu-Medrol, nebulizer treatment and reassess.  ----------------------------------------- 1:10 AM on 06/25/2014 -----------------------------------------  Patient resting in no acute distress. Slight wheezing remains. Patient states she is feeling better and declines another breathing treatment at this time. Will continue to observe and reassess. Room air sats currently 96%.  ----------------------------------------- 2:09 AM on 06/25/2014 -----------------------------------------  Patient sleeping in no acute distress. Room air sats 96%. Very mild wheeze remains patient declines additional breathing treatments; desires discharge to home. Overall patient is improved and feels better. Discussed with patient and given strict return precautions. Patient verbalizes understanding and agrees with plan. ____________________________________________   FINAL CLINICAL IMPRESSION(S) / ED DIAGNOSES  Final diagnoses:  Acute bronchitis, unspecified organism  Chronic obstructive pulmonary disease with acute exacerbation  Smoker      Paulette Blanch, MD 06/25/14 [phone]

## 2014-06-25 LAB — POCT RAPID STREP A: Streptococcus, Group A Screen (Direct): NEGATIVE

## 2014-06-25 MED ORDER — PREDNISONE 20 MG PO TABS: 20.0000 mg | ORAL_TABLET | Freq: Every day | ORAL | Status: DC

## 2014-06-25 MED ORDER — HYDROCOD POLST-CPM POLST ER 10-8 MG/5ML PO SUER: 5.0000 mL | Freq: Two times a day (BID) | ORAL | Status: DC | PRN

## 2014-06-25 MED ORDER — ALBUTEROL SULFATE HFA 108 (90 BASE) MCG/ACT IN AERS: 2.0000 | INHALATION_SPRAY | RESPIRATORY_TRACT | Status: DC | PRN

## 2014-06-25 NOTE — ED Notes (Signed)
EKG done at 2027 on 24-Jun-2014.

## 2014-06-25 NOTE — Discharge Instructions (Signed)
1. Take steroid as prescribed (60 mg daily 4 days). 2. Continue nebulizer and inhaler every 4 hours as needed for wheezing. 3. Use cough medicine as needed (Tussionex). 4. Return to the ER for worsening symptoms, persistent vomiting, difficulty breathing or other concerns.  Acute Bronchitis Bronchitis is inflammation of the airways that extend from the windpipe into the lungs (bronchi). The inflammation often causes mucus to develop. This leads to a cough, which is the most common symptom of bronchitis.  In acute bronchitis, the condition usually develops suddenly and goes away over time, usually in a couple weeks. Smoking, allergies, and asthma can make bronchitis worse. Repeated episodes of bronchitis may cause further lung problems.  CAUSES Acute bronchitis is most often caused by the same virus that causes a cold. The virus can spread from person to person (contagious) through coughing, sneezing, and touching contaminated objects. SIGNS AND SYMPTOMS   Cough.   Fever.   Coughing up mucus.   Body aches.   Chest congestion.   Chills.   Shortness of breath.   Sore throat.  DIAGNOSIS  Acute bronchitis is usually diagnosed through a physical exam. Your health care provider will also ask you questions about your medical history. Tests, such as chest X-rays, are sometimes done to rule out other conditions.  TREATMENT  Acute bronchitis usually goes away in a couple weeks. Oftentimes, no medical treatment is necessary. Medicines are sometimes given for relief of fever or cough. Antibiotic medicines are usually not needed but may be prescribed in certain situations. In some cases, an inhaler may be recommended to help reduce shortness of breath and control the cough. A cool mist vaporizer may also be used to help thin bronchial secretions and make it easier to clear the chest.  HOME CARE INSTRUCTIONS  Get plenty of rest.   Drink enough fluids to keep your urine clear or pale  yellow (unless you have a medical condition that requires fluid restriction). Increasing fluids may help thin your respiratory secretions (sputum) and reduce chest congestion, and it will prevent dehydration.   Take medicines only as directed by your health care provider.  If you were prescribed an antibiotic medicine, finish it all even if you start to feel better.  Avoid smoking and secondhand smoke. Exposure to cigarette smoke or irritating chemicals will make bronchitis worse. If you are a smoker, consider using nicotine gum or skin patches to help control withdrawal symptoms. Quitting smoking will help your lungs heal faster.   Reduce the chances of another bout of acute bronchitis by washing your hands frequently, avoiding people with cold symptoms, and trying not to touch your hands to your mouth, nose, or eyes.   Keep all follow-up visits as directed by your health care provider.  SEEK MEDICAL CARE IF: Your symptoms do not improve after 1 week of treatment.  SEEK IMMEDIATE MEDICAL CARE IF:  You develop an increased fever or chills.   You have chest pain.   You have severe shortness of breath.  You have bloody sputum.   You develop dehydration.  You faint or repeatedly feel like you are going to pass out.  You develop repeated vomiting.  You develop a severe headache. MAKE SURE YOU:   Understand these instructions.  Will watch your condition.  Will get help right away if you are not doing well or get worse. Document Released: 03/09/2004 Document Revised: 06/16/2013 Document Reviewed: 07/23/2012 [facility] [place] [facility] Patient Information 2015 [place], [place]. This information is not intended to replace advice  given to you by your health care provider. Make sure you discuss any questions you have with your health care provider.  Chronic Obstructive Pulmonary Disease Exacerbation Chronic obstructive pulmonary disease (COPD) is a common lung condition in which airflow from the  lungs is limited. COPD is a general term that can be used to describe many different lung problems that limit airflow, including chronic bronchitis and emphysema. COPD exacerbations are episodes when breathing symptoms become much worse and require extra treatment. Without treatment, COPD exacerbations can be life threatening, and frequent COPD exacerbations can cause further damage to your lungs. CAUSES   Respiratory infections.   Exposure to smoke.   Exposure to air pollution, chemical fumes, or dust. Sometimes there is no apparent cause or trigger. RISK FACTORS  Smoking cigarettes.  Older age.  Frequent prior COPD exacerbations. SIGNS AND SYMPTOMS   Increased coughing.   Increased thick spit (sputum) production.   Increased wheezing.   Increased shortness of breath.   Rapid breathing.   Chest tightness. DIAGNOSIS  Your medical history, a physical exam, and tests will help your health care provider make a diagnosis. Tests may include:  A chest X-ray.  Basic lab tests.  Sputum testing.  An arterial blood gas test. TREATMENT  Depending on the severity of your COPD exacerbation, you may need to be admitted to a hospital for treatment. Some of the treatments commonly used to treat COPD exacerbations are:   Antibiotic medicines.   Bronchodilators. These are drugs that expand the air passages. They may be given with an inhaler or nebulizer. Spacer devices may be needed to help improve drug delivery.  Corticosteroid medicines.  Supplemental oxygen therapy.  HOME CARE INSTRUCTIONS   Do not smoke. Quitting smoking is very important to prevent COPD from getting worse and exacerbations from happening as often.  Avoid exposure to all substances that irritate the airway, especially to tobacco smoke.   If you were prescribed an antibiotic medicine, finish it all even if you start to feel better.  Take all medicines as directed by your health care provider.It is  important to use correct technique with inhaled medicines.  Drink enough fluids to keep your urine clear or pale yellow (unless you have a medical condition that requires fluid restriction).  Use a cool mist vaporizer. This makes it easier to clear your chest when you cough.   If you have a home nebulizer and oxygen, continue to use them as directed.   Maintain all necessary vaccinations to prevent infections.   Exercise regularly.   Eat a healthy diet.   Keep all follow-up appointments as directed by your health care provider. SEEK IMMEDIATE MEDICAL CARE IF:  You have worsening shortness of breath.   You have trouble talking.   You have severe chest pain.  You have blood in your sputum.  You have a fever.  You have weakness, vomit repeatedly, or faint.   You feel confused.   You continue to get worse. MAKE SURE YOU:   Understand these instructions.  Will watch your condition.  Will get help right away if you are not doing well or get worse. Document Released: 11/27/2006 Document Revised: 06/16/2013 Document Reviewed: 10/04/2012 [facility] Patient Information 2015 [place], [place]. This information is not intended to replace advice given to you by your health care provider. Make sure you discuss any questions you have with your health care provider.  Cough, Adult  A cough is a reflex that helps clear your throat and airways. It  can help heal the body or may be a reaction to an irritated airway. A cough may only last 2 or 3 weeks (acute) or may last more than 8 weeks (chronic).  CAUSES Acute cough:  Viral or bacterial infections. Chronic cough:  Infections.  Allergies.  Asthma.  Post-nasal drip.  Smoking.  Heartburn or acid reflux.  Some medicines.  Chronic lung problems (COPD).  Cancer. SYMPTOMS   Cough.  Fever.  Chest pain.  Increased breathing rate.  High-pitched whistling sound when breathing (wheezing).  Colored mucus that you  cough up (sputum). TREATMENT   A bacterial cough may be treated with antibiotic medicine.  A viral cough must run its course and will not respond to antibiotics.  Your caregiver may recommend other treatments if you have a chronic cough. HOME CARE INSTRUCTIONS   Only take over-the-counter or prescription medicines for pain, discomfort, or fever as directed by your caregiver. Use cough suppressants only as directed by your caregiver.  Use a cold steam vaporizer or humidifier in your bedroom or home to help loosen secretions.  Sleep in a semi-upright position if your cough is worse at night.  Rest as needed.  Stop smoking if you smoke. SEEK IMMEDIATE MEDICAL CARE IF:   You have pus in your sputum.  Your cough starts to worsen.  You cannot control your cough with suppressants and are losing sleep.  You begin coughing up blood.  You have difficulty breathing.  You develop pain which is getting worse or is uncontrolled with medicine.  You have a fever. MAKE SURE YOU:   Understand these instructions.  Will watch your condition.  Will get help right away if you are not doing well or get worse. Document Released: 07/29/2010 Document Revised: 04/24/2011 Document Reviewed: 07/29/2010 [facility] Patient Information 2015 [place], [place]. This information is not intended to replace advice given to you by your health care provider. Make sure you discuss any questions you have with your health care provider.  Smoking Cessation, Tips for Success If you are ready to quit smoking, congratulations! You have chosen to help yourself be healthier. Cigarettes bring nicotine, tar, carbon monoxide, and other irritants into your body. Your lungs, heart, and blood vessels will be able to work better without these poisons. There are many different ways to quit smoking. Nicotine gum, nicotine patches, a nicotine inhaler, or nicotine nasal spray can help with physical craving. Hypnosis, support groups,  and medicines help break the habit of smoking. WHAT THINGS CAN I DO TO MAKE QUITTING EASIER?  Here are some tips to help you quit for good:  Pick a date when you will quit smoking completely. Tell all of your friends and family about your plan to quit on that date.  Do not try to slowly cut down on the number of cigarettes you are smoking. Pick a quit date and quit smoking completely starting on that day.  Throw away all cigarettes.   Clean and remove all ashtrays from your home, work, and car.  On a card, write down your reasons for quitting. Carry the card with you and read it when you get the urge to smoke.  Cleanse your body of nicotine. Drink enough water and fluids to keep your urine clear or pale yellow. Do this after quitting to flush the nicotine from your body.  Learn to predict your moods. Do not let a bad situation be your excuse to have a cigarette. Some situations in your life might tempt you into wanting a  cigarette.  Never have "just one" cigarette. It leads to wanting another and another. Remind yourself of your decision to quit.  Change habits associated with smoking. If you smoked while driving or when feeling stressed, try other activities to replace smoking. Stand up when drinking your coffee. Brush your teeth after eating. Sit in a different chair when you read the paper. Avoid alcohol while trying to quit, and try to drink fewer caffeinated beverages. Alcohol and caffeine may urge you to smoke.  Avoid foods and drinks that can trigger a desire to smoke, such as sugary or spicy foods and alcohol.  Ask people who smoke not to smoke around you.  Have something planned to do right after eating or having a cup of coffee. For example, plan to take a walk or exercise.  Try a relaxation exercise to calm you down and decrease your stress. Remember, you may be tense and nervous for the first 2 weeks after you quit, but this will pass.  Find new activities to keep your  hands busy. Play with a pen, coin, or rubber band. Doodle or draw things on paper.  Brush your teeth right after eating. This will help cut down on the craving for the taste of tobacco after meals. You can also try mouthwash.   Use oral substitutes in place of cigarettes. Try using lemon drops, carrots, cinnamon sticks, or chewing gum. Keep them handy so they are available when you have the urge to smoke.  When you have the urge to smoke, try deep breathing.  Designate your home as a nonsmoking area.  If you are a heavy smoker, ask your health care provider about a prescription for nicotine chewing gum. It can ease your withdrawal from nicotine.  Reward yourself. Set aside the cigarette money you save and buy yourself something nice.  Look for support from others. Join a support group or smoking cessation program. Ask someone at home or at work to help you with your plan to quit smoking.  Always ask yourself, "Do I need this cigarette or is this just a reflex?" Tell yourself, "Today, I choose not to smoke," or "I do not want to smoke." You are reminding yourself of your decision to quit.  Do not replace cigarette smoking with electronic cigarettes (commonly called e-cigarettes). The safety of e-cigarettes is unknown, and some may contain harmful chemicals.  If you relapse, do not give up! Plan ahead and think about what you will do the next time you get the urge to smoke. HOW WILL I FEEL WHEN I QUIT SMOKING? You may have symptoms of withdrawal because your body is used to nicotine (the addictive substance in cigarettes). You may crave cigarettes, be irritable, feel very hungry, cough often, get headaches, or have difficulty concentrating. The withdrawal symptoms are only temporary. They are strongest when you first quit but will go away within 10-14 days. When withdrawal symptoms occur, stay in control. Think about your reasons for quitting. Remind yourself that these are signs that your body  is healing and getting used to being without cigarettes. Remember that withdrawal symptoms are easier to treat than the major diseases that smoking can cause.  Even after the withdrawal is over, expect periodic urges to smoke. However, these cravings are generally short lived and will go away whether you smoke or not. Do not smoke! WHAT RESOURCES ARE AVAILABLE TO HELP ME QUIT SMOKING? Your health care provider can direct you to community resources or hospitals for support, which may  include:  Group support.  Education.  Hypnosis.  Therapy. Document Released: 10/29/2003 Document Revised: 06/16/2013 Document Reviewed: 07/18/2012 [facility] Patient Information 2015 [place], [place]. This information is not intended to replace advice given to you by your health care provider. Make sure you discuss any questions you have with your health care provider.

## 2014-11-10 ENCOUNTER — Inpatient Hospital Stay
Admission: EM | Admit: 2014-11-10 | Discharge: 2014-11-12 | DRG: 190 | Disposition: A | Discharge status: Home / Self Care | Payer: No Typology Code available for payment source | Attending: Internal Medicine | Admitting: Internal Medicine

## 2014-11-10 ENCOUNTER — Encounter: Payer: Self-pay | Admitting: Emergency Medicine

## 2014-11-10 ENCOUNTER — Emergency Department: Payer: No Typology Code available for payment source

## 2014-11-10 DIAGNOSIS — F17210 Nicotine dependence, cigarettes, uncomplicated: Secondary | ICD-10-CM | POA: Diagnosis present

## 2014-11-10 DIAGNOSIS — G4730 Sleep apnea, unspecified: Secondary | ICD-10-CM | POA: Diagnosis present

## 2014-11-10 DIAGNOSIS — F329 Major depressive disorder, single episode, unspecified: Secondary | ICD-10-CM | POA: Diagnosis present

## 2014-11-10 DIAGNOSIS — Z8249 Family history of ischemic heart disease and other diseases of the circulatory system: Secondary | ICD-10-CM

## 2014-11-10 DIAGNOSIS — Z79899 Other long term (current) drug therapy: Secondary | ICD-10-CM

## 2014-11-10 DIAGNOSIS — F419 Anxiety disorder, unspecified: Secondary | ICD-10-CM | POA: Diagnosis present

## 2014-11-10 DIAGNOSIS — J441 Chronic obstructive pulmonary disease with (acute) exacerbation: Principal | ICD-10-CM | POA: Diagnosis present

## 2014-11-10 DIAGNOSIS — E039 Hypothyroidism, unspecified: Secondary | ICD-10-CM | POA: Diagnosis present

## 2014-11-10 DIAGNOSIS — J45909 Unspecified asthma, uncomplicated: Secondary | ICD-10-CM | POA: Diagnosis present

## 2014-11-10 DIAGNOSIS — Z9889 Other specified postprocedural states: Secondary | ICD-10-CM

## 2014-11-10 DIAGNOSIS — J189 Pneumonia, unspecified organism: Secondary | ICD-10-CM | POA: Diagnosis present

## 2014-11-10 DIAGNOSIS — M549 Dorsalgia, unspecified: Secondary | ICD-10-CM | POA: Diagnosis present

## 2014-11-10 DIAGNOSIS — Z8701 Personal history of pneumonia (recurrent): Secondary | ICD-10-CM

## 2014-11-10 DIAGNOSIS — G8929 Other chronic pain: Secondary | ICD-10-CM | POA: Diagnosis present

## 2014-11-10 DIAGNOSIS — I10 Essential (primary) hypertension: Secondary | ICD-10-CM | POA: Diagnosis present

## 2014-11-10 LAB — COMPREHENSIVE METABOLIC PANEL
ALK PHOS: 62 U/L (ref 38–126)
ALT: 14 U/L (ref 14–54)
AST: 20 U/L (ref 15–41)
Albumin: 4.2 g/dL (ref 3.5–5.0)
Anion gap: 8 (ref 5–15)
BUN: 8 mg/dL (ref 6–20)
CALCIUM: 8.7 mg/dL — AB (ref 8.9–10.3)
CO2: 22 mmol/L (ref 22–32)
Chloride: 107 mmol/L (ref 101–111)
Creatinine, Ser: 0.72 mg/dL (ref 0.44–1.00)
GFR calc non Af Amer: >60 mL/min (ref >60)
GLUCOSE: 96 mg/dL (ref 65–99)
Potassium: 3.4 mmol/L — ABNORMAL LOW (ref 3.5–5.1)
Sodium: 137 mmol/L (ref 135–145)
Total Bilirubin: 0.6 mg/dL (ref 0.3–1.2)
Total Protein: 7.3 g/dL (ref 6.5–8.1)

## 2014-11-10 LAB — CBC
HCT: 34.8 % — ABNORMAL LOW (ref 35.0–47.0)
Hemoglobin: 10.7 g/dL — ABNORMAL LOW (ref 12.0–16.0)
MCH: 24 pg — AB (ref 26.0–34.0)
MCHC: 30.7 g/dL — ABNORMAL LOW (ref 32.0–36.0)
MCV: 78.1 fL — ABNORMAL LOW (ref 80.0–100.0)
Platelets: 288 10*3/uL (ref 150–440)
RBC: 4.45 MIL/uL (ref 3.80–5.20)
RDW: 15.5 % — ABNORMAL HIGH (ref 11.5–14.5)
WBC: 9 10*3/uL (ref 3.6–11.0)

## 2014-11-10 LAB — TROPONIN I: Troponin I: <0.03 ng/mL (ref <0.031)

## 2014-11-10 MED ORDER — ALBUTEROL SULFATE (2.5 MG/3ML) 0.083% IN NEBU
2.5000 mg | INHALATION_SOLUTION | RESPIRATORY_TRACT | Status: AC
  Administered 2014-11-10: 2.5 mg via RESPIRATORY_TRACT
  Filled 2014-11-10: qty 3

## 2014-11-10 MED ORDER — CYCLOBENZAPRINE HCL 10 MG PO TABS
10.0000 mg | ORAL_TABLET | Freq: Three times a day (TID) | ORAL | Status: DC | PRN
  Administered 2014-11-11 – 2014-11-12 (×3): 10 mg via ORAL
  Filled 2014-11-10 (×3): qty 1

## 2014-11-10 MED ORDER — ACETAMINOPHEN 325 MG PO TABS: 650.0000 mg | ORAL_TABLET | Freq: Four times a day (QID) | ORAL | Status: DC | PRN

## 2014-11-10 MED ORDER — SODIUM CHLORIDE 0.9 % IV SOLN
INTRAVENOUS | Status: DC
  Administered 2014-11-10 – 2014-11-12 (×4): via INTRAVENOUS

## 2014-11-10 MED ORDER — DEXTROSE 5 % IV SOLN
1.0000 g | INTRAVENOUS | Status: DC
  Administered 2014-11-10 – 2014-11-11 (×2): 1 g via INTRAVENOUS
  Filled 2014-11-10 (×4): qty 10

## 2014-11-10 MED ORDER — ENOXAPARIN SODIUM 40 MG/0.4ML ~~LOC~~ SOLN
40.0000 mg | SUBCUTANEOUS | Status: DC
  Administered 2014-11-10 – 2014-11-11 (×2): 40 mg via SUBCUTANEOUS
  Filled 2014-11-10 (×2): qty 0.4

## 2014-11-10 MED ORDER — ONDANSETRON HCL 4 MG/2ML IJ SOLN
4.0000 mg | Freq: Four times a day (QID) | INTRAMUSCULAR | Status: DC | PRN
  Administered 2014-11-11: 4 mg via INTRAVENOUS
  Filled 2014-11-10: qty 2

## 2014-11-10 MED ORDER — IBUPROFEN 600 MG PO TABS
600.0000 mg | ORAL_TABLET | ORAL | Status: AC
  Administered 2014-11-10: 600 mg via ORAL
  Filled 2014-11-10: qty 1

## 2014-11-10 MED ORDER — SENNOSIDES-DOCUSATE SODIUM 8.6-50 MG PO TABS: 1.0000 | ORAL_TABLET | Freq: Every evening | ORAL | Status: DC | PRN

## 2014-11-10 MED ORDER — FLUOXETINE HCL 40 MG PO CAPS: 40.0000 mg | ORAL_CAPSULE | Freq: Every morning | ORAL | Status: DC

## 2014-11-10 MED ORDER — IPRATROPIUM-ALBUTEROL 0.5-2.5 (3) MG/3ML IN SOLN
3.0000 mL | Freq: Four times a day (QID) | RESPIRATORY_TRACT | Status: DC
  Administered 2014-11-10 – 2014-11-11 (×3): 3 mL via RESPIRATORY_TRACT
  Filled 2014-11-10 (×4): qty 3

## 2014-11-10 MED ORDER — HYDROCODONE-ACETAMINOPHEN 5-325 MG PO TABS
1.0000 | ORAL_TABLET | Freq: Once | ORAL | Status: AC
  Administered 2014-11-10: 1 via ORAL
  Filled 2014-11-10: qty 1

## 2014-11-10 MED ORDER — GABAPENTIN 100 MG PO CAPS: 200.0000 mg | ORAL_CAPSULE | Freq: Three times a day (TID) | ORAL | Status: DC

## 2014-11-10 MED ORDER — LEVOTHYROXINE SODIUM 100 MCG PO TABS: 100.0000 ug | ORAL_TABLET | Freq: Every day | ORAL | Status: DC

## 2014-11-10 MED ORDER — IPRATROPIUM-ALBUTEROL 0.5-2.5 (3) MG/3ML IN SOLN
3.0000 mL | Freq: Once | RESPIRATORY_TRACT | Status: AC
  Administered 2014-11-10: 3 mL via RESPIRATORY_TRACT

## 2014-11-10 MED ORDER — HYDROCODONE-ACETAMINOPHEN 5-325 MG PO TABS
1.0000 | ORAL_TABLET | ORAL | Status: DC | PRN
  Administered 2014-11-10 – 2014-11-12 (×5): 1 via ORAL
  Filled 2014-11-10 (×5): qty 1

## 2014-11-10 MED ORDER — AZITHROMYCIN 250 MG PO TABS
500.0000 mg | ORAL_TABLET | Freq: Once | ORAL | Status: AC
  Administered 2014-11-10: 500 mg via ORAL
  Filled 2014-11-10: qty 2

## 2014-11-10 MED ORDER — AZITHROMYCIN 250 MG PO TABS: 500.0000 mg | ORAL_TABLET | Freq: Every day | ORAL | Status: DC

## 2014-11-10 MED ORDER — ALBUTEROL SULFATE (2.5 MG/3ML) 0.083% IN NEBU
3.0000 mL | INHALATION_SOLUTION | RESPIRATORY_TRACT | Status: DC | PRN
  Administered 2014-11-10 – 2014-11-11 (×2): 3 mL via RESPIRATORY_TRACT
  Filled 2014-11-10 (×2): qty 3

## 2014-11-10 MED ORDER — BUDESONIDE-FORMOTEROL FUMARATE 160-4.5 MCG/ACT IN AERO
2.0000 | INHALATION_SPRAY | Freq: Two times a day (BID) | RESPIRATORY_TRACT | Status: DC
  Administered 2014-11-10 – 2014-11-12 (×4): 2 via RESPIRATORY_TRACT
  Filled 2014-11-10: qty 6

## 2014-11-10 MED ORDER — ACETAMINOPHEN 650 MG RE SUPP: 650.0000 mg | Freq: Four times a day (QID) | RECTAL | Status: DC | PRN

## 2014-11-10 MED ORDER — LEVOTHYROXINE SODIUM 137 MCG PO TABS
137.0000 ug | ORAL_TABLET | Freq: Every day | ORAL | Status: DC
  Administered 2014-11-11 – 2014-11-12 (×2): 137 ug via ORAL
  Filled 2014-11-10 (×2): qty 1

## 2014-11-10 MED ORDER — DEXTROSE 5 % IV SOLN: 1.0000 g | INTRAVENOUS | Status: DC

## 2014-11-10 MED ORDER — BENZONATATE 100 MG PO CAPS
200.0000 mg | ORAL_CAPSULE | Freq: Three times a day (TID) | ORAL | Status: DC | PRN
  Administered 2014-11-10 – 2014-11-11 (×2): 200 mg via ORAL
  Filled 2014-11-10 (×2): qty 2

## 2014-11-10 MED ORDER — AZITHROMYCIN 250 MG PO TABS
500.0000 mg | ORAL_TABLET | Freq: Every day | ORAL | Status: DC
  Administered 2014-11-11 – 2014-11-12 (×2): 500 mg via ORAL
  Filled 2014-11-10 (×2): qty 2

## 2014-11-10 MED ORDER — METHYLPREDNISOLONE SODIUM SUCC 125 MG IJ SOLR
125.0000 mg | Freq: Once | INTRAMUSCULAR | Status: AC
  Administered 2014-11-10: 125 mg via INTRAVENOUS

## 2014-11-10 MED ORDER — CYCLOBENZAPRINE HCL 10 MG PO TABS: 10.0000 mg | ORAL_TABLET | Freq: Three times a day (TID) | ORAL | Status: DC

## 2014-11-10 MED ORDER — ONDANSETRON HCL 4 MG PO TABS: 4.0000 mg | ORAL_TABLET | Freq: Four times a day (QID) | ORAL | Status: DC | PRN

## 2014-11-10 MED ORDER — METHYLPREDNISOLONE SODIUM SUCC 125 MG IJ SOLR
INTRAMUSCULAR | Status: AC
  Administered 2014-11-10: 125 mg via INTRAVENOUS
  Filled 2014-11-10: qty 2

## 2014-11-10 MED ORDER — METHYLPREDNISOLONE SODIUM SUCC 125 MG IJ SOLR
60.0000 mg | Freq: Two times a day (BID) | INTRAMUSCULAR | Status: DC
  Administered 2014-11-10 – 2014-11-11 (×2): 60 mg via INTRAVENOUS
  Filled 2014-11-10 (×2): qty 2

## 2014-11-10 NOTE — ED Notes (Signed)
Pt being held until environmental is done cleaning room.

## 2014-11-10 NOTE — ED Notes (Signed)
Pt presents to ED with c/o difficulty breathing. Pt reports symptoms have been worsening over the last three days. Pt reports that symptoms have been as bad as they are at present for several hours but notes that she was unable to leave work before now. Pt reports history of asthma and COPD and notes that she is supposed to wear O2 at home but does not do so "because I didn't think I needed it." Pt is tachypnic with cough and difficulty speaking in complete sentences r/t shortness of breath during triage. Pt moved to ED3. Dr Jacqualine Code made aware - presents to bedside.

## 2014-11-10 NOTE — H&P (Signed)
[facility] at [place] NAME: [name]    MR#:  [id number]  DATE OF BIRTH:  Dec 15, 1975  DATE OF ADMISSION:  11/10/2014  PRIMARY CARE PHYSICIAN: Myrtis Ser, MD   REQUESTING/REFERRING PHYSICIAN: Dr.Quale CHIEF COMPLAINT:   Chief Complaint  Patient presents with  . Respiratory Distress    HISTORY OF PRESENT ILLNESS:  [name]  is a 39 y.o. female with a known history of hypertension, hypothyroidism, depression comes in with shortness of breath, cough, fever. Patient has been dealing with this for the past 3-4 days. Getting progressively worse. Does have pleuritic chest pain secondary to cough. Patient says that she has shortness of breath for a long time, he used to smoke heavily and quit in between and restarted smoking about 1 pack every day. Does have sore throat because of coughing. Oxygen saturation dropped to 80% when she got up to use the bathroom. She also is tachycardic with heart rate 1 20 bpm.  PAST MEDICAL HISTORY:   Past Medical History  Diagnosis Date  . COPD (chronic obstructive pulmonary disease)   . Asthma   . Depression   . Tobacco abuse   . Alcohol abuse   . Anemia 01/31/2011  . Hypoxia 02/01/2011  . Needs sleep apnea assessment 2013.07.31    PAST SURGICAL HISTOIRY:   Past Surgical History  Procedure Laterality Date  . Back surgery    . Cesarean section    . Dilation and curettage of uterus    . Ankle ganglion cyst excision      SOCIAL HISTORY:   Social History  Substance Use Topics  . Smoking status: Current Every Day Smoker -- 1.00 packs/day    Types: Cigarettes    Last Attempt to Quit: 06/19/2012  . Smokeless tobacco: Not on file  . Alcohol Use: 0.0 oz/week     Comment: occ    FAMILY HISTORY:   Family History  Problem Relation Age of Onset  . Hypothyroidism Mother   . Coronary artery disease Mother     DRUG ALLERGIES:  No Known Allergies  REVIEW OF SYSTEMS:   CONSTITUTIONAL: No fever, fatigue or weakness.  EYES: No blurred or double vision.  EARS, NOSE, AND THROAT: No tinnitus or ear pain.  RESPIRATORY:Cough, shortness of breath., Wheezing also.  CARDIOVASCULAR: No chest pain, orthopnea, edema.  GASTROINTESTINAL: No nausea, vomiting, diarrhea or abdominal pain.  GENITOURINARY: No dysuria, hematuria.  ENDOCRINE: No polyuria, nocturia,  HEMATOLOGY: No anemia, easy bruising or bleeding SKIN: No rash or lesion. MUSCULOSKELETAL: No joint pain or arthritis.   NEUROLOGIC: No tingling, numbness, weakness.  PSYCHIATRY: No anxiety or depression.   MEDICATIONS AT HOME:   Prior to Admission medications   Medication Sig Start Date End Date Taking? Authorizing Provider  albuterol (PROVENTIL HFA;VENTOLIN HFA) 108 (90 BASE) MCG/ACT inhaler Inhale 2 puffs into the lungs every 6 (six) hours as needed for wheezing or shortness of breath.    Historical Provider, MD  albuterol (PROVENTIL HFA;VENTOLIN HFA) 108 (90 BASE) MCG/ACT inhaler Inhale 2 puffs into the lungs every 4 (four) hours as needed for wheezing or shortness of breath. 06/25/14   Paulette Blanch, MD  albuterol (PROVENTIL) (2.5 MG/3ML) 0.083% nebulizer solution Take 3 mLs (2.5 mg total) by nebulization every 6 (six) hours as needed for wheezing or shortness of breath. For asthma 02/01/11   Rexene Alberts, MD  azithromycin (ZITHROMAX) 500 MG tablet Take 1 tablet (500 mg total) by mouth daily. Take in the  evening. 01/22/13   Samuella Cota, MD  budesonide-formoterol [facility]) 160-4.5 MCG/ACT inhaler Inhale 2 puffs into the lungs 2 (two) times daily.    Historical Provider, MD  cefUROXime (CEFTIN) 500 MG tablet Take 1 tablet (500 mg total) by mouth 2 (two) times daily with a meal. 01/22/13   Samuella Cota, MD  chlorpheniramine-HYDROcodone (TUSSIONEX) 10-8 MG/5ML SUER Take 5 mLs by mouth every 12 (twelve) hours as needed for cough. 06/25/14   Paulette Blanch, MD  cyclobenzaprine (FLEXERIL) 10 MG tablet Take 1  tablet (10 mg total) by mouth 3 (three) times daily. 01/19/12   Ruben Im, PA-C  FLUoxetine (PROZAC) 40 MG capsule Take 40 mg by mouth every morning. For depression. 01/19/12   Ruben Im, PA-C  gabapentin (NEURONTIN) 100 MG capsule Take 2 capsules (200 mg total) by mouth 3 (three) times daily. For anxiety and neuropathic pain. 01/19/12   Ruben Im, PA-C  levothyroxine (SYNTHROID, LEVOTHROID) 100 MCG tablet Take 100 mcg by mouth daily before breakfast.    Historical Provider, MD  predniSONE (DELTASONE) 20 MG tablet Take 1 tablet (20 mg total) by mouth daily. Take 3 tablets (60 mg total) by mouth daily 4 days. 06/25/14 06/25/15  Paulette Blanch, MD      VITAL SIGNS:  Blood pressure 117/81, pulse 113, temperature 97.4 F (36.3 C), temperature source Oral, resp. rate 19, height 5\' 5"  (1.651 m), weight 97.841 kg (215 lb 11.2 oz), last menstrual period 11/10/2014, SpO2 94 %.  PHYSICAL EXAMINATION:  GENERAL:  39 y.o.-year-old patient lying in the bed with no acute distress.  EYES: Pupils equal, round, reactive to light and accommodation. No scleral icterus. Extraocular muscles intact.  HEENT: Head atraumatic, normocephalic. Oropharynx and nasopharynx clear.  NECK:  Supple, no jugular venous distention. No thyroid enlargement, no tenderness.  LUNGS:Bilateral expiratory wheeze in all lung fields.n. No use of accessory muscles of respiration.  CARDIOVASCULAR: S1, S2 normal Tachycardic.Marland Kitchen No murmurs, rubs, or gallops.  ABDOMEN: Soft, nontender, nondistended. Bowel sounds present. No organomegaly or mass.  EXTREMITIES: No pedal edema, cyanosis, or clubbing.  NEUROLOGIC: Cranial nerves II through XII are intact. Muscle strength 5/5 in all extremities. Sensation intact. Gait not checked.  PSYCHIATRIC: The patient is alert and oriented x 3.  SKIN: No obvious rash, lesion, or ulcer.   LABORATORY PANEL:   CBC  Recent Labs Lab 11/10/14 1420  WBC 9.0  HGB 10.7*  HCT 34.8*  PLT 288    ------------------------------------------------------------------------------------------------------------------  Chemistries   Recent Labs Lab 11/10/14 1420  NA 137  K 3.4*  CL 107  CO2 22  GLUCOSE 96  BUN 8  CREATININE 0.72  CALCIUM 8.7*  AST 20  ALT 14  ALKPHOS 62  BILITOT 0.6   ------------------------------------------------------------------------------------------------------------------  Cardiac Enzymes  Recent Labs Lab 11/10/14 1420  TROPONINI <0.03   ------------------------------------------------------------------------------------------------------------------  RADIOLOGY:  Dg Chest 1 View  11/10/2014   CLINICAL DATA:  Shortness of breath, productive cough.  EXAM: CHEST 1 VIEW  COMPARISON:  Jun 24, 2014.  FINDINGS: The heart size and mediastinal contours are within normal limits. No pneumothorax or pleural effusion is noted. Right lung is clear. Mild left lingular subsegmental atelectasis or scarring is noted. The visualized skeletal structures are unremarkable.  IMPRESSION: Mild left lingular subsegmental atelectasis or scarring.   Electronically Signed   By: Marijo Conception, [name]   On: 11/10/2014 14:43    EKG:   Orders placed or performed during the hospital encounter of 11/10/14  .  ED EKG  . ED EKG  . ED EKG  . ED EKG   normal sinus rhythm at 94 bpm.   IMPRESSION AND PLAN:   #1 COPD exacerbation: Continue IV steroids, nebulizers, antibiotics. #2 community-acquired pneumonia chest x-ray concerning for left-sided infiltrate: Continue IV Rocephin, Zithromax. #3 cough and pleuritic chest pain at Tussionex, Percocet. #4 Hypothyroidism continue Synthroid  5.depression: Continue home medications Heavy tobacco abuse consulate is smoking and offered assistance, consulate for about 10 minutes. Patient says she will try to quit again.   #6 patient has a history of sleep apnea, used to use 02  3-4 L at night.  All the records are reviewed and case  discussed with ED provider. Management plans discussed with the patient, family and they are in agreement.  CODE STATUS: full  TOTAL TIME TAKING CARE OF THIS PATIENT: 55 minutes.    Epifanio Lesches M.D on 11/10/2014 at 4:17 PM  Between 7am to 6pm - Pager - [phone]  After 6pm go to [email or web address] - password [email or web address] [facility]  Office  [phone]  CC: Primary care physician; Myrtis Ser, MD

## 2014-11-10 NOTE — ED Provider Notes (Addendum)
[facility] Emergency Department Provider Note REMINDER - THIS NOTE IS NOT A FINAL MEDICAL RECORD UNTIL IT IS SIGNED. UNTIL THEN, THE CONTENT BELOW MAY REFLECT INFORMATION FROM A DOCUMENTATION TEMPLATE, NOT THE ACTUAL PATIENT VISIT. ____________________________________________  Time seen: Approximately 3:37 PM  I have reviewed the triage vital signs and the nursing notes.   HISTORY  Chief Complaint Respiratory Distress    HPI [name] is a 39 y.o. female history of COPD, presents today with shortness of breath which is been worsening throughout the last 24 hours. She takes nebulizer at home and is not improving. Call for primary care appointment, states her symptoms are worsening point she felt like she come emergency room for evaluation. She does use oxygen at home, but states she has not been using it recently because she does not believe she needs it.  She is respiratory history of pneumonia, and also previous history of alcohol use.  She denies fevers chills, doesn't dry cough. No leg swelling. She feels like her chest is old tight from breathing, but denies overt chest pain.  Past Medical History  Diagnosis Date  . COPD (chronic obstructive pulmonary disease)   . Asthma   . Depression   . Tobacco abuse   . Alcohol abuse   . Anemia 01/31/2011  . Hypoxia 02/01/2011  . Needs sleep apnea assessment 2013.07.31    Patient Active Problem List   Diagnosis Date Noted  . CAP (community acquired pneumonia) 01/19/2013  . Dehydration 01/19/2013  . Nausea vomiting and diarrhea 01/19/2013  . Alcohol dependence 01/16/2012  . Alcohol withdrawal 01/16/2012  . Hypoxia 02/01/2011  . Hyponatremia 02/01/2011  . Anemia 01/31/2011  . Chest pain, pleuritic 01/29/2011  . Back pain 01/29/2011  . HCAP (healthcare-associated pneumonia) 01/28/2011  . Acute bronchitis 01/08/2011  . COPD exacerbation 01/08/2011  . Tobacco abuse 01/08/2011  . Polysubstance  abuse 01/08/2011  . Major depression 01/08/2011    Past Surgical History  Procedure Laterality Date  . Back surgery    . Cesarean section    . Dilation and curettage of uterus    . Ankle ganglion cyst excision      Current Outpatient Rx  Name  Route  Sig  Dispense  Refill  . albuterol (PROVENTIL HFA;VENTOLIN HFA) 108 (90 BASE) MCG/ACT inhaler   Inhalation   Inhale 2 puffs into the lungs every 6 (six) hours as needed for wheezing or shortness of breath.         Marland Kitchen albuterol (PROVENTIL HFA;VENTOLIN HFA) 108 (90 BASE) MCG/ACT inhaler   Inhalation   Inhale 2 puffs into the lungs every 4 (four) hours as needed for wheezing or shortness of breath.   1 Inhaler   0   . albuterol (PROVENTIL) (2.5 MG/3ML) 0.083% nebulizer solution   Nebulization   Take 3 mLs (2.5 mg total) by nebulization every 6 (six) hours as needed for wheezing or shortness of breath. For asthma   75 mL   1   . azithromycin (ZITHROMAX) 500 MG tablet   Oral   Take 1 tablet (500 mg total) by mouth daily. Take in the evening.   2 tablet   0   . budesonide-formoterol (SYMBICORT) 160-4.5 MCG/ACT inhaler   Inhalation   Inhale 2 puffs into the lungs 2 (two) times daily.         . cefUROXime (CEFTIN) 500 MG tablet   Oral   Take 1 tablet (500 mg total) by mouth 2 (two) times daily with  a meal.   7 tablet   0   . chlorpheniramine-HYDROcodone (TUSSIONEX) 10-8 MG/5ML SUER   Oral   Take 5 mLs by mouth every 12 (twelve) hours as needed for cough.   70 mL   0   . cyclobenzaprine (FLEXERIL) 10 MG tablet   Oral   Take 1 tablet (10 mg total) by mouth 3 (three) times daily.   15 tablet   0   . FLUoxetine (PROZAC) 40 MG capsule   Oral   Take 40 mg by mouth every morning. For depression.         . gabapentin (NEURONTIN) 100 MG capsule   Oral   Take 2 capsules (200 mg total) by mouth 3 (three) times daily. For anxiety and neuropathic pain.   180 capsule   0   . levothyroxine (SYNTHROID, LEVOTHROID) 100 MCG  tablet   Oral   Take 100 mcg by mouth daily before breakfast.         . predniSONE (DELTASONE) 20 MG tablet   Oral   Take 1 tablet (20 mg total) by mouth daily. Take 3 tablets (60 mg total) by mouth daily 4 days.   12 tablet   0     Allergies Review of patient's allergies indicates no known allergies.  Family History  Problem Relation Age of Onset  . Hypothyroidism Mother   . Coronary artery disease Mother     Social History Social History  Substance Use Topics  . Smoking status: Current Every Day Smoker -- 1.00 packs/day    Types: Cigarettes    Last Attempt to Quit: 06/19/2012  . Smokeless tobacco: None  . Alcohol Use: 0.0 oz/week     Comment: occ    Review of Systems Constitutional: No fever/chills Eyes: No visual changes. ENT: No sore throat. Cardiovascular: See history of present illness Respiratory: See history of present illness Gastrointestinal: No abdominal pain.  No nausea, no vomiting.  No diarrhea.  No constipation. Genitourinary: Negative for dysuria. Musculoskeletal: Negative for back pain. Skin: Negative for rash. Neurological: Negative for headaches, focal weakness or numbness.  10-point ROS otherwise negative.  ____________________________________________   PHYSICAL EXAM:  VITAL SIGNS: ED Triage Vitals  Enc Vitals Group     BP 11/10/14 1414 106/77 mmHg     Pulse Rate 11/10/14 1414 104     Resp 11/10/14 1414 26     Temp 11/10/14 1414 97.4 F (36.3 C)     Temp Source 11/10/14 1414 Oral     SpO2 11/10/14 1414 94 %     Weight 11/10/14 1414 215 lb 11.2 oz (97.841 kg)     Height 11/10/14 1414 5\' 5"  (1.651 m)     Head Cir --      Peak Flow --      Pain Score 11/10/14 1416 6     Pain Loc --      Pain Edu? --      Excl. in [place]? --    Constitutional: Alert and oriented. Patient is sitting straight up, moderate increased work of breathing with speaking in short phrases. Eyes: Conjunctivae are normal. PERRL. EOMI. Head: Atraumatic. Nose:  No congestion/rhinnorhea. Mouth/Throat: Mucous membranes are slightly dry.  Oropharynx non-erythematous. Neck: No stridor.   Cardiovascular: Tachycardic rate, regular rhythm. Grossly normal heart sounds.  Good peripheral circulation. Respiratory: Diffuse and expiratory wheezing, moderate increased her breathing, speaks in short phrases. No rales. Gastrointestinal: Soft and nontender. No distention. No abdominal bruits. No CVA tenderness. Musculoskeletal: No lower extremity  tenderness nor edema.  No joint effusions. Neurologic:  Normal speech and language. No gross focal neurologic deficits are appreciated. No gait instability. Skin:  Skin is warm, dry and intact. No rash noted. Psychiatric: Mood and affect are normal. Speech and behavior are normal.  ____________________________________________   LABS (all labs ordered are listed, but only abnormal results are displayed)  Labs Reviewed  CBC - Abnormal; Notable for the following:    Hemoglobin 10.7 (*)    HCT 34.8 (*)    MCV 78.1 (*)    MCH 24.0 (*)    MCHC 30.7 (*)    RDW 15.5 (*)    All other components within normal limits  COMPREHENSIVE METABOLIC PANEL - Abnormal; Notable for the following:    Potassium 3.4 (*)    Calcium 8.7 (*)    All other components within normal limits  TROPONIN I   ____________________________________________  EKG  ED ECG REPORT I, QUALE, MARK, the attending physician, personally viewed and interpreted this ECG.  Date: 11/10/2014 EKG Time: 1430 Rate: 90 Rhythm: normal sinus rhythm QRS Axis: normal Intervals: normal ST/T Wave abnormalities: normal Conduction Disutrbances: none Narrative Interpretation: unremarkable  ____________________________________________  RADIOLOGY  IMPRESSION: Mild left lingular subsegmental atelectasis or scarring. ____________________________________________   PROCEDURES  Procedure(s) performed: None  Critical Care performed:  No  ____________________________________________   INITIAL IMPRESSION / ASSESSMENT AND PLAN / ED COURSE  Pertinent labs & imaging results that were available during my care of the patient were reviewed by me and considered in my medical decision making (see chart for details).  Patient presents with increasing wheezing and dyspnea. She does have borderline hypoxia when off oxygen, but also states she is supposed be on home oxygen but is not compliant. She been using a treatment home in improving, she has had stacked DuoNeb here as well as an additional albuterol but still shows mild increased work of breathing. Solu-Medrol and azithromycin administered, though labs and chest x-ray do not support overt pneumonia as well as her clinical history does not suggest fevers or significant productive cough.  Her EKG is normal, I do not believe there is any acute cardiac component based on her symptomatology and previous history on presentation today.  Plan of care assigned to Dr. Marcelene Butte with the plan to reassess the patient at approximately 4:15 PM, should she have significant increase her ongoing work of breathing then likely admission for treatment of COPD versus discharging home with traditional COPD treatment should she show significant improvement.  4PM  Patient ongoing end exp wheezing and mild use of accessory muscles. Speaks in phrases, overall showing improvement. Given ongoing dyspnea and slow, but steady, response to treatment will admit for ongoing care and observation. D/W Hospitalist. ____________________________________________   FINAL CLINICAL IMPRESSION(S) / ED DIAGNOSES  Final diagnoses:  COPD exacerbation      Delman Kitten, MD 11/10/14 1547  Delman Kitten, MD 11/10/14 1559

## 2014-11-11 LAB — BASIC METABOLIC PANEL
ANION GAP: 6 (ref 5–15)
BUN: 10 mg/dL (ref 6–20)
CALCIUM: 8.6 mg/dL — AB (ref 8.9–10.3)
CO2: 23 mmol/L (ref 22–32)
Chloride: 107 mmol/L (ref 101–111)
Creatinine, Ser: 0.6 mg/dL (ref 0.44–1.00)
Glucose, Bld: 154 mg/dL — ABNORMAL HIGH (ref 65–99)
POTASSIUM: 3.8 mmol/L (ref 3.5–5.1)
Sodium: 136 mmol/L (ref 135–145)

## 2014-11-11 LAB — CBC
HCT: 33.2 % — ABNORMAL LOW (ref 35.0–47.0)
HEMOGLOBIN: 10.3 g/dL — AB (ref 12.0–16.0)
MCH: 24.1 pg — ABNORMAL LOW (ref 26.0–34.0)
MCHC: 31 g/dL — ABNORMAL LOW (ref 32.0–36.0)
MCV: 77.8 fL — ABNORMAL LOW (ref 80.0–100.0)
Platelets: 286 10*3/uL (ref 150–440)
RBC: 4.26 MIL/uL (ref 3.80–5.20)
RDW: 15.7 % — ABNORMAL HIGH (ref 11.5–14.5)
WBC: 13.4 10*3/uL — ABNORMAL HIGH (ref 3.6–11.0)

## 2014-11-11 MED ORDER — INFLUENZA VAC SPLIT QUAD 0.5 ML IM SUSY
0.5000 mL | PREFILLED_SYRINGE | INTRAMUSCULAR | Status: AC
  Administered 2014-11-12: 0.5 mL via INTRAMUSCULAR
  Filled 2014-11-11: qty 0.5

## 2014-11-11 MED ORDER — IPRATROPIUM-ALBUTEROL 0.5-2.5 (3) MG/3ML IN SOLN
3.0000 mL | RESPIRATORY_TRACT | Status: DC
  Administered 2014-11-11 – 2014-11-12 (×7): 3 mL via RESPIRATORY_TRACT
  Filled 2014-11-11 (×7): qty 3

## 2014-11-11 MED ORDER — GUAIFENESIN ER 600 MG PO TB12
600.0000 mg | ORAL_TABLET | Freq: Two times a day (BID) | ORAL | Status: DC
  Administered 2014-11-11 – 2014-11-12 (×3): 600 mg via ORAL
  Filled 2014-11-11 (×3): qty 1

## 2014-11-11 MED ORDER — METHYLPREDNISOLONE SODIUM SUCC 125 MG IJ SOLR
60.0000 mg | Freq: Three times a day (TID) | INTRAMUSCULAR | Status: DC
  Administered 2014-11-11 – 2014-11-12 (×4): 60 mg via INTRAVENOUS
  Filled 2014-11-11 (×4): qty 2

## 2014-11-11 MED ORDER — ALPRAZOLAM 0.5 MG PO TABS: 0.5000 mg | ORAL_TABLET | Freq: Four times a day (QID) | ORAL | Status: DC | PRN

## 2014-11-11 MED ORDER — MONTELUKAST SODIUM 10 MG PO TABS
10.0000 mg | ORAL_TABLET | Freq: Every day | ORAL | Status: DC
  Administered 2014-11-11: 10 mg via ORAL
  Filled 2014-11-11: qty 1

## 2014-11-11 MED ORDER — ALBUTEROL SULFATE (2.5 MG/3ML) 0.083% IN NEBU
3.0000 mL | INHALATION_SOLUTION | RESPIRATORY_TRACT | Status: DC | PRN
  Administered 2014-11-11: 3 mL via RESPIRATORY_TRACT
  Filled 2014-11-11: qty 3

## 2014-11-11 NOTE — Progress Notes (Signed)
[place] at [place] NAME: [name]    MR#:  [id number]  DATE OF BIRTH:  Feb 07, 1976  SUBJECTIVE:  CHIEF COMPLAINT:   Chief Complaint  Patient presents with  . Respiratory Distress   - Very anxious appearing patient, has significant wheezing on exam -Not in any home oxygen, currently requiring 2-3 L  O2.  REVIEW OF SYSTEMS:  Review of Systems  Constitutional: Negative for fever and chills.  HENT: Negative for ear discharge, ear pain and nosebleeds.   Eyes: Negative for blurred vision, double vision and photophobia.  Respiratory: Positive for cough, sputum production, shortness of breath and wheezing.   Cardiovascular: Negative for chest pain and palpitations.  Gastrointestinal: Negative for nausea, vomiting, abdominal pain, diarrhea and constipation.  Genitourinary: Negative for dysuria, urgency and frequency.  Musculoskeletal: Negative for myalgias, back pain and neck pain.  Neurological: Negative for dizziness, sensory change, speech change, focal weakness, seizures and headaches.  Psychiatric/Behavioral: Negative for depression. The patient is not nervous/anxious.     DRUG ALLERGIES:  No Known Allergies  VITALS:  Blood pressure 98/53, pulse 88, temperature 98.4 F (36.9 C), temperature source Oral, resp. rate 22, height 5\' 5"  (1.651 m), weight 95.255 kg (210 lb), last menstrual period 11/10/2014, SpO2 95 %.  PHYSICAL EXAMINATION:  Physical Exam  GENERAL:  39 y.o.-year-old patient lying in the bed with some respiratory distress.  EYES: Pupils equal, round, reactive to light and accommodation. No scleral icterus. Extraocular muscles intact.  HEENT: Head atraumatic, normocephalic. Oropharynx and nasopharynx clear.  NECK:  Supple, no jugular venous distention. No thyroid enlargement, no tenderness.  LUNGS: Coarse breath sounds bilaterally, scattered diffuse expiratory wheezing all over the lung fields. No crackles or  rhonchi heard. No use of accessory muscles for breathing CARDIOVASCULAR: S1, S2 normal. No murmurs, rubs, or gallops.  ABDOMEN: Soft, nontender, nondistended. Bowel sounds present. No organomegaly or mass.  EXTREMITIES: No pedal edema, cyanosis, or clubbing.  NEUROLOGIC: Cranial nerves II through XII are intact. Muscle strength 5/5 in all extremities. Sensation intact. Gait not checked.  PSYCHIATRIC: The patient is alert and oriented x 3. Appears anxious. SKIN: No obvious rash, lesion, or ulcer.    LABORATORY PANEL:   CBC  Recent Labs Lab 11/11/14 0334  WBC 13.4*  HGB 10.3*  HCT 33.2*  PLT 286   ------------------------------------------------------------------------------------------------------------------  Chemistries   Recent Labs Lab 11/10/14 1420 11/11/14 0334  NA 137 136  K 3.4* 3.8  CL 107 107  CO2 22 23  GLUCOSE 96 154*  BUN 8 10  CREATININE 0.72 0.60  CALCIUM 8.7* 8.6*  AST 20  --   ALT 14  --   ALKPHOS 62  --   BILITOT 0.6  --    ------------------------------------------------------------------------------------------------------------------  Cardiac Enzymes  Recent Labs Lab 11/10/14 1420  TROPONINI <0.03   ------------------------------------------------------------------------------------------------------------------  RADIOLOGY:  Dg Chest 1 View  11/10/2014   CLINICAL DATA:  Shortness of breath, productive cough.  EXAM: CHEST 1 VIEW  COMPARISON:  Jun 24, 2014.  FINDINGS: The heart size and mediastinal contours are within normal limits. No pneumothorax or pleural effusion is noted. Right lung is clear. Mild left lingular subsegmental atelectasis or scarring is noted. The visualized skeletal structures are unremarkable.  IMPRESSION: Mild left lingular subsegmental atelectasis or scarring.   Electronically Signed   By: Marijo Conception, [name]   On: 11/10/2014 14:43    EKG:   Orders placed or performed during the hospital encounter of  11/10/14  .  ED EKG  . ED EKG  . ED EKG  . ED EKG    ASSESSMENT AND PLAN:   39 year old female with known history of hypertension, COPD not on home oxygen currently, tobacco use disorder, chronic back pain presents to the hospital secondary to acute on chronic COPD exacerbation.  #1 acute on chronic COPD exacerbation-diffuse significant wheezing still noted on exam. -Increased her Solu-Medrol to every 8 hours, change to nebulizers to scheduled - Added Singulair as she might have environmental allergies as well, as her flares often happen around season change. -Spiriva at the time of discharge, cough medications added. -Chest x-ray with no infiltrate, some scarring noted. -Continue Rocephin and azithromycin as patient has significant productive cough  #2 anxiety-we'll start Xanax  #3 hypothyroidism-on Synthroid  #4 chronic back pain-continue Flexeril when necessary  #5 DVT prophylaxis-patient on Lovenox  All the records are reviewed and case discussed with Care Management/Social Workerr. Management plans discussed with the patient, family and they are in agreement.  CODE STATUS: Full code  TOTAL TIME TAKING CARE OF THIS PATIENT: 36 minutes.   POSSIBLE D/C IN 2 DAYS, DEPENDING ON CLINICAL CONDITION.   Gladstone Lighter M.D on 11/11/2014 at 1:07 PM  Between 7am to 6pm - Pager - [phone]  After 6pm go to [email or web address] - password [email or web address] [facility]  Office  [phone]  CC: Primary care physician; Myrtis Ser, MD

## 2014-11-11 NOTE — Progress Notes (Signed)
Phone call received from patient reporting "I just had a coughing spell and need my ventolin inhaler"  Pt anxious but able to talk in complete sentences without hesitation.  Explained to patient I would come see her after completing a blood draw on the  patient I was in room with.  On way down hall, no coughing episodes noted, pt lying in bed on arrival to patient's room, pt upset with explanation that I was seeing another patient and states she should not have needed to wait 45 minutes, which was untrue.  Pt offered ventolin nebulizer and cough meds but refused stating "I want my Ventolin inhaler like I'm supposed to always keep with me.  Explained to patient ventolin inhaler not ordered and nebulizer will work better for her expiratory wheezing and complaints of cough. Pt then agreed to take nebulizer.  After nebulizer treatment, attempted to assist patient with reapplying oxygen via nasal cannula.  Pt insisting that oxygen was not flowing even though air could be felt from nasal prongs and humidity bottle with bubbling.  Patient assurred that oxygen was working and she became upset and requested for me to leave room.  RN Jerene Pitch and [facility] in to see after attempts to calm and patient requesting to be discharged to leave against medical advice. Dr. Marcille Blanco made aware and order given for inhaler.

## 2014-11-11 NOTE — Care Management (Signed)
Patient presents from home and has history of asthma and COPD.   Has a home nebulizer machine "but I do not use it like I should."  She has inhalers- one long acting and one short acting "but I do not use them like I should."  She has home 02 through [facility] but does not know if it is just for night time use or if it should be continuous. Says I do not use it like I am suppose to because I did not think I needed it."   Says that she needs tubing supplies.  Checking with [facility] to determine original order and to inquire about supplies and informed patient home 02 order is for continuous at 3 liters.  Spoke with Will from [facility] and asked him to speak with patient regarding her tubing and to bring a portable 02 tank to patient's hospital room for transport home.     Patient is employed and lives with her significant other.  Has Insurance and is followed by [facility] and was last seen about 2 months ago.  Receives "much assist" with her medications.  She says she has been on Spiriva in the past but had to stop it due to cost.  It is documented that patient will discharge on this medication.  Left message for 2nd floor pharmacy to assist with Spiriva Assistance Application.

## 2014-11-12 MED ORDER — DEXTROMETHORPHAN HBR 15 MG/5ML PO SYRP: 10.0000 mL | ORAL_SOLUTION | Freq: Four times a day (QID) | ORAL | Status: DC | PRN

## 2014-11-12 MED ORDER — AZITHROMYCIN 500 MG PO TABS: 500.0000 mg | ORAL_TABLET | Freq: Every day | ORAL | Status: DC

## 2014-11-12 MED ORDER — BUDESONIDE-FORMOTEROL FUMARATE 160-4.5 MCG/ACT IN AERO: 2.0000 | INHALATION_SPRAY | Freq: Two times a day (BID) | RESPIRATORY_TRACT | Status: DC

## 2014-11-12 MED ORDER — IPRATROPIUM-ALBUTEROL 0.5-2.5 (3) MG/3ML IN SOLN: 3.0000 mL | RESPIRATORY_TRACT | Status: DC | PRN

## 2014-11-12 MED ORDER — PREDNISONE 10 MG PO TABS: 10.0000 mg | ORAL_TABLET | Freq: Every day | ORAL | Status: DC

## 2014-11-12 NOTE — Progress Notes (Signed)
[place] at [facility] was admitted to the Hospital on 11/10/2014 and Discharged  11/12/2014 and should be excused from work/school   for 5 days starting 11/10/2014 , may return to work/school without any restrictions.  Call Bettey Costa MD with questions.  MODY, SITAL M.D on 11/12/2014,at 1:13 PM  [place] at [facility]  [phone]

## 2014-11-12 NOTE — Care Management (Signed)
A portable 02 tank has been delivered to patient room by [facility].  Provided patient with a Free Trial Offer for one month free Symbicort.  Patient says she has never used a coupon for this medication.  Denies any problems obtaining medications.  Patient has transportation home

## 2014-11-12 NOTE — Discharge Summary (Signed)
[name] at [place] NAME: [name]    MR#:  [id number]  DATE OF BIRTH:  [birth year]  DATE OF ADMISSION:  11/10/2014 ADMITTING PHYSICIAN: Epifanio Lesches, MD  DATE OF DISCHARGE: 11/12/2014  PRIMARY CARE PHYSICIAN: Nat Christen, PA-C    ADMISSION DIAGNOSIS:  COPD exacerbation [J44.1]  DISCHARGE DIAGNOSIS:  Active Problems:   COPD exacerbation   SECONDARY DIAGNOSIS:   Past Medical History  Diagnosis Date  . COPD (chronic obstructive pulmonary disease)   . Asthma   . Depression   . Tobacco abuse   . Alcohol abuse   . Anemia 01/31/2011  . Hypoxia 02/01/2011  . Needs sleep apnea assessment 2013.07.31    HOSPITAL COURSE:  39 year old female with known history of essential hypertension, COPD who says that she does wear 2 L of oxygen when necessary at home and chronic back pain who presents with acute on chronic COPD exacerbation. For further details please refer the H&P.  1. Acute on chronic COPD exacerbation: Patient had diffuse wheezing on examination. Patient was started on IV steroids along with nebulizer and inhalers. Patient is actually much improved. Patient states that she has oxygen at home but does not use it as she just needs it when necessary. Patient will continue with oxygen and steroids taper. Patient will continue with nebulizer and inhalers at discharge. Patient was on Rocephin and azithromycin and will be discharged on azithromycin.  2. Hypothyroidism: Continue Synthroid  3. Chronic back pain: Continue when necessary in pain medications.   DISCHARGE CONDITIONS AND DIET:  Patient's being discharged on a regular diet in stable condition  CONSULTS OBTAINED:     DRUG ALLERGIES:  No Known Allergies  DISCHARGE MEDICATIONS:   Current Discharge Medication List    START taking these medications   Details  azithromycin (ZITHROMAX) 500 MG tablet Take 1 tablet (500 mg total) by mouth daily. Qty:  3 tablet, Refills: 0    dextromethorphan 15 MG/5ML syrup Take 10 mLs (30 mg total) by mouth 4 (four) times daily as needed for cough. Qty: 120 mL, Refills: 0    ipratropium-albuterol (DUONEB) 0.5-2.5 (3) MG/3ML SOLN Take 3 mLs by nebulization every 4 (four) hours as needed. Qty: 360 mL, Refills: 0    predniSONE (DELTASONE) 10 MG tablet Take 1 tablet (10 mg total) by mouth daily with breakfast. Qty: 30 tablet, Refills: 0      CONTINUE these medications which have CHANGED   Details  budesonide-formoterol (SYMBICORT) 160-4.5 MCG/ACT inhaler Inhale 2 puffs into the lungs 2 (two) times daily. Qty: 1 Inhaler, Refills: 0      CONTINUE these medications which have NOT CHANGED   Details  albuterol (PROVENTIL) (2.5 MG/3ML) 0.083% nebulizer solution Take 3 mLs (2.5 mg total) by nebulization every 6 (six) hours as needed for wheezing or shortness of breath. For asthma Qty: 75 mL, Refills: 1    cyclobenzaprine (FLEXERIL) 10 MG tablet Take 1 tablet (10 mg total) by mouth 3 (three) times daily. Qty: 15 tablet, Refills: 0    levothyroxine (SYNTHROID, LEVOTHROID) 137 MCG tablet Take 137 mcg by mouth daily.      STOP taking these medications     albuterol (PROVENTIL HFA;VENTOLIN HFA) 108 (90 BASE) MCG/ACT inhaler               Today   CHIEF COMPLAINT:  Patient is doing fairly well this morning. Patient reports she is significantly improved. She continues to have a cough.   VITAL  SIGNS:  Blood pressure 90/51, pulse 70, temperature 98 F (36.7 C), temperature source Oral, resp. rate 18, height 5\' 5"  (1.651 m), weight 95.255 kg (210 lb), last menstrual period 11/10/2014, SpO2 100 %.   REVIEW OF SYSTEMS:  Review of Systems  Constitutional: Negative for fever, chills and malaise/fatigue.  HENT: Negative for sore throat.   Eyes: Negative for blurred vision.  Respiratory: Positive for cough and wheezing (improved). Negative for hemoptysis and shortness of breath.   Cardiovascular:  Negative for chest pain, palpitations and leg swelling.  Gastrointestinal: Negative for nausea, vomiting, abdominal pain, diarrhea and blood in stool.  Genitourinary: Negative for dysuria.  Musculoskeletal: Negative for back pain.  Neurological: Negative for dizziness, tremors and headaches.  Endo/Heme/Allergies: Does not bruise/bleed easily.     PHYSICAL EXAMINATION:  GENERAL:  39 y.o.-year-old patient lying in the bed with no acute distress.  NECK:  Supple, no jugular venous distention. No thyroid enlargement, no tenderness.  LUNGS: Normal breath sounds bilaterally, mild bilateral wheezing with good airflow, no rales,rhonchi  No use of accessory muscles of respiration.  CARDIOVASCULAR: S1, S2 normal. No murmurs, rubs, or gallops.  ABDOMEN: Soft, non-tender, non-distended. Bowel sounds present. No organomegaly or mass.  EXTREMITIES: No pedal edema, cyanosis, or clubbing.  PSYCHIATRIC: The patient is alert and oriented x 3.  SKIN: No obvious rash, lesion, or ulcer.   DATA REVIEW:   CBC  Recent Labs Lab 11/11/14 0334  WBC 13.4*  HGB 10.3*  HCT 33.2*  PLT 286    Chemistries   Recent Labs Lab 11/10/14 1420 11/11/14 0334  NA 137 136  K 3.4* 3.8  CL 107 107  CO2 22 23  GLUCOSE 96 154*  BUN 8 10  CREATININE 0.72 0.60  CALCIUM 8.7* 8.6*  AST 20  --   ALT 14  --   ALKPHOS 62  --   BILITOT 0.6  --     Cardiac Enzymes  Recent Labs Lab 11/10/14 1420  TROPONINI <0.03    Microbiology Results  @[email or web address] @  RADIOLOGY:  Dg Chest 1 View  11/10/2014   CLINICAL DATA:  Shortness of breath, productive cough.  EXAM: CHEST 1 VIEW  COMPARISON:  Jun 24, 2014.  FINDINGS: The heart size and mediastinal contours are within normal limits. No pneumothorax or pleural effusion is noted. Right lung is clear. Mild left lingular subsegmental atelectasis or scarring is noted. The visualized skeletal structures are unremarkable.  IMPRESSION: Mild left lingular subsegmental atelectasis  or scarring.   Electronically Signed   By: Marijo Conception, [name]   On: 11/10/2014 14:43      Management plans discussed with the patient and she is in agreement. Stable for discharge home  Patient should follow up with PCP in one week  CODE STATUS:     Code Status Orders        Start     Ordered   11/10/14 1614  Full code   Continuous     11/10/14 1616      TOTAL TIME TAKING CARE OF THIS PATIENT: 35 minutes.    MODY, SITAL M.D on 11/12/2014 at 1:08 PM  Between 7am to 6pm - Pager - [phone] After 6pm go to [email or web address] - password [email or web address] [facility]  [facility]  Office  [phone]  CC: Primary care physician; Nat Christen, PA-C

## 2016-02-10 ENCOUNTER — Encounter: Payer: Self-pay | Admitting: Emergency Medicine

## 2016-02-10 ENCOUNTER — Emergency Department: Payer: No Typology Code available for payment source

## 2016-02-10 ENCOUNTER — Inpatient Hospital Stay
Admission: EM | Admit: 2016-02-10 | Discharge: 2016-02-13 | DRG: 190 | Disposition: A | Discharge status: Home / Self Care | Payer: No Typology Code available for payment source | Attending: Internal Medicine | Admitting: Internal Medicine

## 2016-02-10 DIAGNOSIS — E039 Hypothyroidism, unspecified: Secondary | ICD-10-CM | POA: Diagnosis present

## 2016-02-10 DIAGNOSIS — J441 Chronic obstructive pulmonary disease with (acute) exacerbation: Secondary | ICD-10-CM | POA: Diagnosis present

## 2016-02-10 DIAGNOSIS — Z8249 Family history of ischemic heart disease and other diseases of the circulatory system: Secondary | ICD-10-CM

## 2016-02-10 DIAGNOSIS — Z9981 Dependence on supplemental oxygen: Secondary | ICD-10-CM

## 2016-02-10 DIAGNOSIS — D649 Anemia, unspecified: Secondary | ICD-10-CM | POA: Diagnosis present

## 2016-02-10 DIAGNOSIS — J440 Chronic obstructive pulmonary disease with acute lower respiratory infection: Principal | ICD-10-CM | POA: Diagnosis present

## 2016-02-10 DIAGNOSIS — J9621 Acute and chronic respiratory failure with hypoxia: Secondary | ICD-10-CM | POA: Diagnosis present

## 2016-02-10 DIAGNOSIS — J189 Pneumonia, unspecified organism: Secondary | ICD-10-CM | POA: Diagnosis present

## 2016-02-10 DIAGNOSIS — E876 Hypokalemia: Secondary | ICD-10-CM | POA: Diagnosis present

## 2016-02-10 LAB — BASIC METABOLIC PANEL
ANION GAP: 5 (ref 5–15)
BUN: 16 mg/dL (ref 6–20)
CHLORIDE: 107 mmol/L (ref 101–111)
CO2: 25 mmol/L (ref 22–32)
CREATININE: 0.68 mg/dL (ref 0.44–1.00)
Calcium: 8.5 mg/dL — ABNORMAL LOW (ref 8.9–10.3)
GFR calc non Af Amer: >60 mL/min (ref >60)
Glucose, Bld: 88 mg/dL (ref 65–99)
Potassium: 3.1 mmol/L — ABNORMAL LOW (ref 3.5–5.1)
SODIUM: 137 mmol/L (ref 135–145)

## 2016-02-10 LAB — CBC
HEMATOCRIT: 33.3 % — AB (ref 35.0–47.0)
HEMOGLOBIN: 10.6 g/dL — AB (ref 12.0–16.0)
MCH: 24.1 pg — ABNORMAL LOW (ref 26.0–34.0)
MCHC: 31.9 g/dL — AB (ref 32.0–36.0)
MCV: 75.6 fL — ABNORMAL LOW (ref 80.0–100.0)
Platelets: 247 10*3/uL (ref 150–440)
RBC: 4.41 MIL/uL (ref 3.80–5.20)
RDW: 16.5 % — ABNORMAL HIGH (ref 11.5–14.5)
WBC: 4.7 10*3/uL (ref 3.6–11.0)

## 2016-02-10 LAB — TROPONIN I

## 2016-02-10 MED ORDER — PROMETHAZINE HCL 25 MG/ML IJ SOLN
25.0000 mg | Freq: Once | INTRAMUSCULAR | Status: AC
  Administered 2016-02-10: 25 mg via INTRAVENOUS
  Filled 2016-02-10: qty 1

## 2016-02-10 MED ORDER — METHYLPREDNISOLONE SODIUM SUCC 125 MG IJ SOLR
125.0000 mg | Freq: Once | INTRAMUSCULAR | Status: AC
  Administered 2016-02-10: 125 mg via INTRAVENOUS
  Filled 2016-02-10: qty 2

## 2016-02-10 MED ORDER — ALBUTEROL SULFATE (2.5 MG/3ML) 0.083% IN NEBU
15.0000 mg/h | INHALATION_SOLUTION | Freq: Once | RESPIRATORY_TRACT | Status: AC
  Administered 2016-02-10: 15 mg/h via RESPIRATORY_TRACT
  Filled 2016-02-10: qty 9

## 2016-02-10 MED ORDER — IPRATROPIUM BROMIDE 0.02 % IN SOLN
1.0000 mg | Freq: Once | RESPIRATORY_TRACT | Status: AC
  Administered 2016-02-10: 1 mg via RESPIRATORY_TRACT
  Filled 2016-02-10: qty 5

## 2016-02-10 MED ORDER — SODIUM CHLORIDE 0.9 % IV BOLUS (SEPSIS)
1000.0000 mL | Freq: Once | INTRAVENOUS | Status: AC
  Administered 2016-02-10: 1000 mL via INTRAVENOUS

## 2016-02-10 MED ORDER — ALBUTEROL SULFATE (2.5 MG/3ML) 0.083% IN NEBU
INHALATION_SOLUTION | RESPIRATORY_TRACT | Status: AC
  Administered 2016-02-10: 15 mg/h via RESPIRATORY_TRACT
  Filled 2016-02-10: qty 9

## 2016-02-10 MED ORDER — LEVOFLOXACIN IN D5W 750 MG/150ML IV SOLN
750.0000 mg | Freq: Once | INTRAVENOUS | Status: AC
  Administered 2016-02-11: 750 mg via INTRAVENOUS
  Filled 2016-02-10: qty 150

## 2016-02-10 MED ORDER — MORPHINE SULFATE (PF) 4 MG/ML IV SOLN
4.0000 mg | Freq: Once | INTRAVENOUS | Status: AC
  Administered 2016-02-10: 4 mg via INTRAVENOUS
  Filled 2016-02-10: qty 1

## 2016-02-10 MED ORDER — MAGNESIUM SULFATE 2 GM/50ML IV SOLN
2.0000 g | Freq: Once | INTRAVENOUS | Status: AC
  Administered 2016-02-10: 2 g via INTRAVENOUS
  Filled 2016-02-10: qty 50

## 2016-02-10 NOTE — ED Provider Notes (Signed)
Kari Provider Note   CSN: [id number] Arrival date & time: 02/10/16  2124     History   Chief Complaint Chief Complaint  Patient presents with  . Shortness of Breath    HPI [name] is a 40 y.o. female history of COPD, alcohol abuse here presenting with shortness of breath, fever, cough. About 5 days ago, she started coughing and has some wheezing. She was running a fever around 5 days ago that resolved. The last 2-3 days she has worsening cough and shortness of breath. States that she was on 2 L nasal cannula as needed before but has been requiring all day as well as at nighttime.    The history is provided by the patient.    Past Medical History:  Diagnosis Date  . Alcohol abuse   . Anemia 01/31/2011  . Asthma   . COPD (chronic obstructive pulmonary disease) ([place])   . Depression   . Hypoxia 02/01/2011  . Needs sleep apnea assessment 2013.07.31  . Tobacco abuse     Patient Active Problem List   Diagnosis Date Noted  . CAP (community acquired pneumonia) 01/19/2013  . Dehydration 01/19/2013  . Nausea vomiting and diarrhea 01/19/2013  . Alcohol dependence ([place]) 01/16/2012  . Alcohol withdrawal ([place]) 01/16/2012  . Hypoxia 02/01/2011  . Hyponatremia 02/01/2011  . Anemia 01/31/2011  . Chest pain, pleuritic 01/29/2011  . Back pain 01/29/2011  . HCAP (healthcare-associated pneumonia) 01/28/2011  . Acute bronchitis 01/08/2011  . COPD exacerbation ([place]) 01/08/2011  . Tobacco abuse 01/08/2011  . Polysubstance abuse 01/08/2011  . Major depression 01/08/2011    Past Surgical History:  Procedure Laterality Date  . ANKLE GANGLION CYST EXCISION    . BACK SURGERY    . CESAREAN SECTION    . DILATION AND CURETTAGE OF UTERUS      OB History    No data available       Home Medications    Prior to Admission medications   Medication Sig Start Date End Date Taking? Authorizing Provider  albuterol (PROVENTIL HFA;VENTOLIN HFA) 108 (90  Base) MCG/ACT inhaler Inhale 1-2 puffs into the lungs every 6 (six) hours as needed for wheezing or shortness of breath.   Yes Historical Provider, MD  albuterol (PROVENTIL) (2.5 MG/3ML) 0.083% nebulizer solution Take 3 mLs (2.5 mg total) by nebulization every 6 (six) hours as needed for wheezing or shortness of breath. For asthma 02/01/11  Yes Rexene Alberts, MD  Fluticasone-Salmeterol (ADVAIR) 250-50 MCG/DOSE AEPB Inhale 1 puff into the lungs 2 (two) times daily.   Yes Historical Provider, MD  ipratropium-albuterol (DUONEB) 0.5-2.5 (3) MG/3ML SOLN Take 3 mLs by nebulization every 4 (four) hours as needed. 11/12/14  Yes Bettey Costa, MD  levothyroxine (SYNTHROID, LEVOTHROID) 125 MCG tablet Take 125 mcg by mouth daily before breakfast.   Yes Historical Provider, MD  montelukast (SINGULAIR) 10 MG tablet Take 10 mg by mouth at bedtime.   Yes Historical Provider, MD  azithromycin (ZITHROMAX) 500 MG tablet Take 1 tablet (500 mg total) by mouth daily. Patient not taking: Reported on 02/10/2016 11/12/14   Bettey Costa, MD  budesonide-formoterol (SYMBICORT) 160-4.5 MCG/ACT inhaler Inhale 2 puffs into the lungs 2 (two) times daily. Patient not taking: Reported on 02/10/2016 11/12/14   Bettey Costa, MD  cyclobenzaprine (FLEXERIL) 10 MG tablet Take 1 tablet (10 mg total) by mouth 3 (three) times daily. Patient not taking: Reported on 02/10/2016 01/19/12   Ruben Im, PA-C  dextromethorphan 15 MG/5ML syrup Take  10 mLs (30 mg total) by mouth 4 (four) times daily as needed for cough. Patient not taking: Reported on 02/10/2016 11/12/14   Bettey Costa, MD  predniSONE (DELTASONE) 10 MG tablet Take 1 tablet (10 mg total) by mouth daily with breakfast. Patient not taking: Reported on 02/10/2016 11/12/14   Bettey Costa, MD    Family History Family History  Problem Relation Age of Onset  . Hypothyroidism Mother   . Coronary artery disease Mother     Social History Social History  Substance Use Topics  . Smoking status:  Current Every Day Smoker    Packs/day: 1.00    Types: Cigarettes    Last attempt to quit: 06/19/2012  . Smokeless tobacco: Not on file  . Alcohol use 0.0 oz/week     Comment: occ     Allergies   Patient has no known allergies.   Review of Systems Review of Systems  Respiratory: Positive for cough and shortness of breath.   All other systems reviewed and are negative.    Physical Exam Updated Vital Signs BP (!) 109/41   Pulse 92   Temp 98.4 F (36.9 C) (Oral)   Resp 19   Ht 5\' 5"  (1.651 m)   Wt 215 lb (97.5 kg)   LMP 01/27/2016   SpO2 96%   BMI 35.78 kg/m   Physical Exam  Constitutional:  Short of breath, tachypneic   HENT:  Head: Normocephalic.  Mouth/Throat: Oropharynx is clear and moist.  Eyes: EOM are normal. Pupils are equal, round, and reactive to light.  Neck: Normal range of motion. Neck supple.  Cardiovascular: Regular rhythm and normal heart sounds.   Tachycardic   Pulmonary/Chest:  Tachypneic, diffuse wheezing, mild retractions   Abdominal: Soft. Bowel sounds are normal. She exhibits no distension.  Musculoskeletal: Normal range of motion.  Neurological: She is alert.  Skin: Skin is warm.  Psychiatric: She has a normal mood and affect.  Nursing note and vitals reviewed.    ED Treatments / Results  Labs (all labs ordered are listed, but only abnormal results are displayed) Labs Reviewed  CBC - Abnormal; Notable for the following:       Result Value   Hemoglobin 10.6 (*)    HCT 33.3 (*)    MCV 75.6 (*)    MCH 24.1 (*)    MCHC 31.9 (*)    RDW 16.5 (*)    All other components within normal limits  BASIC METABOLIC PANEL - Abnormal; Notable for the following:    Potassium 3.1 (*)    Calcium 8.5 (*)    All other components within normal limits  CULTURE, BLOOD (ROUTINE X 2)  CULTURE, BLOOD (ROUTINE X 2)  TROPONIN I  LACTIC ACID, PLASMA    EKG  EKG Interpretation None      ED ECG REPORT I, Wandra Arthurs, the attending physician,  personally viewed and interpreted this ECG.   Date: 02/10/2016  EKG Time: 22:04 pm  Rate: 96  Rhythm: normal EKG, normal sinus rhythm  Axis: normal  Intervals:none  ST&T Change: none    Radiology Dg Chest 2 View  Result Date: 02/10/2016 CLINICAL DATA:  Dyspnea for a few days.  Wheezing and body aches. EXAM: CHEST  2 VIEW COMPARISON:  CXR exams from 11/10/2014 and 06/24/2014 FINDINGS: The heart size and mediastinal contours are within normal limits. There is bibasilar atelectasis and/or scarring more so in the lingula and left lung base. Small focus of airspace opacity at the right  lung base cannot exclude a subtle pneumonia. The visualized skeletal structures are unremarkable. IMPRESSION: Lingular and left lower lobe atelectasis/scarring. Small focus of airspace opacity in the right lung base cannot exclude pneumonia. Electronically Signed   By: Ashley Royalty [name]   On: 02/10/2016 22:14    Procedures Procedures (including critical care time) \    Medications Ordered in ED Medications  magnesium sulfate IVPB 2 g 50 mL (2 g Intravenous New Bag/Given 02/10/16 2222)  levofloxacin (LEVAQUIN) IVPB 750 mg (not administered)  sodium chloride 0.9 % bolus 1,000 mL (1,000 mLs Intravenous New Bag/Given 02/10/16 2222)  promethazine (PHENERGAN) injection 25 mg (25 mg Intravenous Given 02/10/16 2222)  albuterol (PROVENTIL) (2.5 MG/3ML) 0.083% nebulizer solution (15 mg/hr Nebulization Given 02/10/16 2225)  ipratropium (ATROVENT) nebulizer solution 1 mg (1 mg Nebulization Given 02/10/16 2225)  morphine 4 MG/ML injection 4 mg (4 mg Intravenous Given 02/10/16 2222)  methylPREDNISolone sodium succinate (SOLU-MEDROL) 125 mg/2 mL injection 125 mg (125 mg Intravenous Given 02/10/16 2222)     Initial Impression / Assessment and Plan / ED Course  I have reviewed the triage vital signs and the nursing notes.  Pertinent labs & imaging results that were available during my care of the patient were  reviewed by me and considered in my medical decision making (see chart for details).  Clinical Course    DIXIELEE Matthews is a 40 y.o. female here with cough, fever, shortness of breath. Hx of COPD and been using O2 at all times, which is new for her. Will get labs, CXR. Will give continuous nebs, solumedrol, magnesium. Will monitor closely.   11:42 PM Patient still tachypneic and tachycardic. Still has diffuse wheezing. CXR showed possible pneumonia. WBC nl. Added on lactate, cultures. Given levaquin. Hospitalist to admit for COPD, CAP.   Final Clinical Impressions(s) / ED Diagnoses   Final diagnoses:  None    New Prescriptions New Prescriptions   No medications on file     Drenda Freeze, MD 02/10/16 2342

## 2016-02-10 NOTE — ED Triage Notes (Signed)
Pt in with co sh[facility] for few days, hx of copd. Audible wheezing noted in triage and has body aches. Pt co chest tightness and dry cough.

## 2016-02-10 NOTE — ED Notes (Signed)
Patient transported to X-ray 

## 2016-02-10 NOTE — H&P (Signed)
[facility] [place] NAME: [name]    MR#:  [id number]  DATE OF BIRTH:  03-Mar-1975  DATE OF ADMISSION:  02/10/2016  PRIMARY CARE PHYSICIAN: Nat Christen, PA-C   REQUESTING/REFERRING PHYSICIAN: Darl Householder, MD  CHIEF COMPLAINT:   Chief Complaint  Patient presents with  . Shortness of Breath    HISTORY OF PRESENT ILLNESS:  [name]  is a 40 y.o. female who presents with 3 days of increasing shortness of breath, cough, sputum, and myalgia. Patient reports subjective fever at home as well. Here in the ED she was found to have relatively normal labs, and a chest x-ray which showed a small area concerning for possible pneumonia. Patient has a long history of COPD and her presentation is consistent with exacerbation of the same. Hospitals were called for admission.  PAST MEDICAL HISTORY:   Past Medical History:  Diagnosis Date  . Alcohol abuse   . Anemia 01/31/2011  . Asthma   . COPD (chronic obstructive pulmonary disease) ([place])   . Depression   . Hypoxia 02/01/2011  . Needs sleep apnea assessment 2013.07.31  . Tobacco abuse     PAST SURGICAL HISTORY:   Past Surgical History:  Procedure Laterality Date  . ANKLE GANGLION CYST EXCISION    . BACK SURGERY    . CESAREAN SECTION    . DILATION AND CURETTAGE OF UTERUS      SOCIAL HISTORY:   Social History  Substance Use Topics  . Smoking status: Current Every Day Smoker    Packs/day: 1.00    Types: Cigarettes    Last attempt to quit: 06/19/2012  . Smokeless tobacco: Not on file  . Alcohol use 0.0 oz/week     Comment: occ    FAMILY HISTORY:   Family History  Problem Relation Age of Onset  . Hypothyroidism Mother   . Coronary artery disease Mother     DRUG ALLERGIES:  No Known Allergies  MEDICATIONS AT HOME:   Prior to Admission medications   Medication Sig Start Date End Date Taking? Authorizing Provider  albuterol (PROVENTIL HFA;VENTOLIN HFA) 108  (90 Base) MCG/ACT inhaler Inhale 1-2 puffs into the lungs every 6 (six) hours as needed for wheezing or shortness of breath.   Yes Historical Provider, MD  albuterol (PROVENTIL) (2.5 MG/3ML) 0.083% nebulizer solution Take 3 mLs (2.5 mg total) by nebulization every 6 (six) hours as needed for wheezing or shortness of breath. For asthma 02/01/11  Yes Rexene Alberts, MD  Fluticasone-Salmeterol (ADVAIR) 250-50 MCG/DOSE AEPB Inhale 1 puff into the lungs 2 (two) times daily.   Yes Historical Provider, MD  ipratropium-albuterol (DUONEB) 0.5-2.5 (3) MG/3ML SOLN Take 3 mLs by nebulization every 4 (four) hours as needed. 11/12/14  Yes Bettey Costa, MD  levothyroxine (SYNTHROID, LEVOTHROID) 125 MCG tablet Take 125 mcg by mouth daily before breakfast.   Yes Historical Provider, MD  montelukast (SINGULAIR) 10 MG tablet Take 10 mg by mouth at bedtime.   Yes Historical Provider, MD  azithromycin (ZITHROMAX) 500 MG tablet Take 1 tablet (500 mg total) by mouth daily. Patient not taking: Reported on 02/10/2016 11/12/14   Bettey Costa, MD  budesonide-formoterol (SYMBICORT) 160-4.5 MCG/ACT inhaler Inhale 2 puffs into the lungs 2 (two) times daily. Patient not taking: Reported on 02/10/2016 11/12/14   Bettey Costa, MD  cyclobenzaprine (FLEXERIL) 10 MG tablet Take 1 tablet (10 mg total) by mouth 3 (three) times daily. Patient not taking: Reported on 02/10/2016 01/19/12   Milta Deiters  T Mashburn, PA-C  dextromethorphan 15 MG/5ML syrup Take 10 mLs (30 mg total) by mouth 4 (four) times daily as needed for cough. Patient not taking: Reported on 02/10/2016 11/12/14   Bettey Costa, MD  predniSONE (DELTASONE) 10 MG tablet Take 1 tablet (10 mg total) by mouth daily with breakfast. Patient not taking: Reported on 02/10/2016 11/12/14   Bettey Costa, MD    REVIEW OF SYSTEMS:  Review of Systems  Constitutional: Positive for fever (subjective) and malaise/fatigue. Negative for chills and weight loss.  HENT: Negative for ear pain, hearing loss and  tinnitus.   Eyes: Negative for blurred vision, double vision, pain and redness.  Respiratory: Positive for cough and shortness of breath. Negative for hemoptysis.   Cardiovascular: Negative for chest pain, palpitations, orthopnea and leg swelling.  Gastrointestinal: Negative for abdominal pain, constipation, diarrhea, nausea and vomiting.  Genitourinary: Negative for dysuria, frequency and hematuria.  Musculoskeletal: Positive for myalgias. Negative for back pain, joint pain and neck pain.  Skin:       No acne, rash, or lesions  Neurological: Negative for dizziness, tremors, focal weakness and weakness.  Endo/Heme/Allergies: Negative for polydipsia. Does not bruise/bleed easily.  Psychiatric/Behavioral: Negative for depression. The patient is not nervous/anxious and does not have insomnia.      VITAL SIGNS:   Vitals:   02/10/16 2142 02/10/16 2205 02/10/16 2220 02/10/16 2226  BP: (!) 114/47 (!) 109/41    Pulse: (!) 103 (!) 105 92   Resp: (!) 24 (!) 27 19   Temp: 98.4 F (36.9 C)     TempSrc: Oral     SpO2: 97% 91% 94% 96%  Weight: 97.5 kg (215 lb)     Height: 5\' 5"  (1.651 m)      Wt Readings from Last 3 Encounters:  02/10/16 97.5 kg (215 lb)  11/10/14 95.3 kg (210 lb)  06/24/14 92.5 kg (204 lb)    PHYSICAL EXAMINATION:  Physical Exam  Vitals reviewed. Constitutional: She is oriented to person, place, and time. She appears well-developed and well-nourished. No distress.  HENT:  Head: Normocephalic and atraumatic.  Mouth/Throat: Oropharynx is clear and moist.  Eyes: Conjunctivae and EOM are normal. Pupils are equal, round, and reactive to light. No scleral icterus.  Neck: Normal range of motion. Neck supple. No JVD present. No thyromegaly present.  Cardiovascular: Normal rate, regular rhythm and intact distal pulses.  Exam reveals no gallop and no friction rub.   No murmur heard. Respiratory: She is in respiratory distress. She has wheezes. She has no rales.  GI: Soft.  Bowel sounds are normal. She exhibits no distension. There is no tenderness.  Musculoskeletal: Normal range of motion. She exhibits no edema.  No arthritis, no gout  Lymphadenopathy:    She has no cervical adenopathy.  Neurological: She is alert and oriented to person, place, and time. No cranial nerve deficit.  No dysarthria, no aphasia  Skin: Skin is warm and dry. No rash noted. No erythema.  Psychiatric: She has a normal mood and affect. Her behavior is normal. Judgment and thought content normal.    LABORATORY PANEL:   CBC  Recent Labs Lab 02/10/16 2209  WBC 4.7  HGB 10.6*  HCT 33.3*  PLT 247   ------------------------------------------------------------------------------------------------------------------  Chemistries   Recent Labs Lab 02/10/16 2209  NA 137  K 3.1*  CL 107  CO2 25  GLUCOSE 88  BUN 16  CREATININE 0.68  CALCIUM 8.5*   ------------------------------------------------------------------------------------------------------------------  Cardiac Enzymes  Recent Labs Lab 02/10/16  [place] <0.03   ------------------------------------------------------------------------------------------------------------------  RADIOLOGY:  Dg Chest 2 View  Result Date: 02/10/2016 CLINICAL DATA:  Dyspnea for a few days.  Wheezing and body aches. EXAM: CHEST  2 VIEW COMPARISON:  CXR exams from 11/10/2014 and 06/24/2014 FINDINGS: The heart size and mediastinal contours are within normal limits. There is bibasilar atelectasis and/or scarring more so in the lingula and left lung base. Small focus of airspace opacity at the right lung base cannot exclude a subtle pneumonia. The visualized skeletal structures are unremarkable. IMPRESSION: Lingular and left lower lobe atelectasis/scarring. Small focus of airspace opacity in the right lung base cannot exclude pneumonia. Electronically Signed   By: Ashley Royalty [name]   On: 02/10/2016 22:14    EKG:   Orders placed or  performed during the hospital encounter of 02/10/16  . ED EKG  . ED EKG    IMPRESSION AND PLAN:  Principal Problem:   COPD exacerbation ([place]) - given her myalgias we will test her for influenza as well. IV steroids initiated, along with antibiotics. When necessary DuoNeb's and antitussives. Active Problems:   CAP (community acquired pneumonia) - IV antibiotics and other treatment as above   Hypokalemia - replete potassium and monitor   Hypothyroidism - continue home dose thyroid replacement  All the records are reviewed and case discussed with ED provider. Management plans discussed with the patient and/or family.  DVT PROPHYLAXIS: SubQ lovenox  GI PROPHYLAXIS: None  ADMISSION STATUS: Inpatient  CODE STATUS: Full Code Status History    Date Active Date Inactive Code Status Order ID Comments User Context   11/10/2014  4:16 PM 11/12/2014  5:45 PM Full Code [id number]  Epifanio Lesches, MD ED   01/19/2013 10:34 PM 01/22/2013  6:00 PM Full Code [id number]  Bonnielee Haff, MD ED   01/14/2012 11:30 PM 01/15/2012 10:00 PM Full Code [id number]  Kalman Drape, MD ED   11/10/2011  3:01 AM 11/10/2011 10:03 PM Full Code [id number]  Sharyon Cable, MD ED   09/10/2011  7:43 AM 09/10/2011  5:25 PM Full Code [id number]  Janice Norrie, MD ED   01/28/2011  7:47 AM 02/01/2011  6:49 PM Full Code [id number]  Pollie Meyer, LPN Inpatient   [id number]  2:33 AM 01/09/2011  2:14 PM Full Code [id number]  Bella Kennedy, RN Inpatient   12/30/2010  6:09 AM 12/30/2010  8:12 PM Full Code [id number]  Johnna Acosta, MD ED      TOTAL TIME TAKING CARE OF THIS PATIENT: 45 minutes.    Deshaun Weisinger FIELDING 02/10/2016, 11:05 PM  [facility]  Office  [phone]  CC: Primary care physician; Nat Christen, PA-C

## 2016-02-11 LAB — BASIC METABOLIC PANEL
ANION GAP: 5 (ref 5–15)
BUN: 12 mg/dL (ref 6–20)
CALCIUM: 7.8 mg/dL — AB (ref 8.9–10.3)
CO2: 22 mmol/L (ref 22–32)
Chloride: 112 mmol/L — ABNORMAL HIGH (ref 101–111)
Creatinine, Ser: 0.7 mg/dL (ref 0.44–1.00)
GFR calc Af Amer: >60 mL/min (ref >60)
GLUCOSE: 153 mg/dL — AB (ref 65–99)
Potassium: 4.1 mmol/L (ref 3.5–5.1)
Sodium: 139 mmol/L (ref 135–145)

## 2016-02-11 LAB — CBC
HCT: 30.2 % — ABNORMAL LOW (ref 35.0–47.0)
HEMOGLOBIN: 9.9 g/dL — AB (ref 12.0–16.0)
MCH: 25 pg — AB (ref 26.0–34.0)
MCHC: 32.6 g/dL (ref 32.0–36.0)
MCV: 76.5 fL — ABNORMAL LOW (ref 80.0–100.0)
Platelets: 219 10*3/uL (ref 150–440)
RBC: 3.95 MIL/uL (ref 3.80–5.20)
RDW: 16.5 % — AB (ref 11.5–14.5)
WBC: 3.9 10*3/uL (ref 3.6–11.0)

## 2016-02-11 LAB — EXPECTORATED SPUTUM ASSESSMENT W REFEX TO RESP CULTURE

## 2016-02-11 LAB — INFLUENZA PANEL BY PCR (TYPE A & B)
INFLAPCR: NEGATIVE
INFLBPCR: NEGATIVE

## 2016-02-11 LAB — LACTIC ACID, PLASMA: Lactic Acid, Venous: 1.2 mmol/L (ref 0.5–1.9)

## 2016-02-11 LAB — EXPECTORATED SPUTUM ASSESSMENT W GRAM STAIN, RFLX TO RESP C

## 2016-02-11 MED ORDER — ACETAMINOPHEN 650 MG RE SUPP: 650.0000 mg | Freq: Four times a day (QID) | RECTAL | Status: DC | PRN

## 2016-02-11 MED ORDER — MOMETASONE FURO-FORMOTEROL FUM 200-5 MCG/ACT IN AERO
2.0000 | INHALATION_SPRAY | Freq: Two times a day (BID) | RESPIRATORY_TRACT | Status: DC
  Administered 2016-02-11: 2 via RESPIRATORY_TRACT
  Filled 2016-02-11: qty 8.8

## 2016-02-11 MED ORDER — NICOTINE 21 MG/24HR TD PT24
21.0000 mg | MEDICATED_PATCH | Freq: Every day | TRANSDERMAL | Status: DC
  Administered 2016-02-11 – 2016-02-13 (×3): 21 mg via TRANSDERMAL
  Filled 2016-02-11 (×3): qty 1

## 2016-02-11 MED ORDER — GUAIFENESIN-DM 100-10 MG/5ML PO SYRP: 5.0000 mL | ORAL_SOLUTION | ORAL | Status: DC | PRN

## 2016-02-11 MED ORDER — IPRATROPIUM-ALBUTEROL 0.5-2.5 (3) MG/3ML IN SOLN
3.0000 mL | RESPIRATORY_TRACT | Status: DC
Start: 2016-02-11 — End: 2016-02-13
  Administered 2016-02-11 – 2016-02-13 (×11): 3 mL via RESPIRATORY_TRACT
  Filled 2016-02-11 (×12): qty 3

## 2016-02-11 MED ORDER — HYDROCOD POLST-CPM POLST ER 10-8 MG/5ML PO SUER
5.0000 mL | Freq: Two times a day (BID) | ORAL | Status: DC
  Administered 2016-02-11 – 2016-02-13 (×5): 5 mL via ORAL
  Filled 2016-02-11 (×5): qty 5

## 2016-02-11 MED ORDER — METHYLPREDNISOLONE SODIUM SUCC 125 MG IJ SOLR
60.0000 mg | Freq: Four times a day (QID) | INTRAMUSCULAR | Status: DC
Start: 2016-02-11 — End: 2016-02-12
  Administered 2016-02-11 – 2016-02-12 (×6): 60 mg via INTRAVENOUS
  Filled 2016-02-11 (×6): qty 2

## 2016-02-11 MED ORDER — BUDESONIDE 0.5 MG/2ML IN SUSP
0.5000 mg | Freq: Two times a day (BID) | RESPIRATORY_TRACT | Status: DC
Start: 2016-02-11 — End: 2016-02-13
  Administered 2016-02-11 – 2016-02-13 (×5): 0.5 mg via RESPIRATORY_TRACT
  Filled 2016-02-11 (×5): qty 2

## 2016-02-11 MED ORDER — ONDANSETRON HCL 4 MG PO TABS: 4.0000 mg | ORAL_TABLET | Freq: Four times a day (QID) | ORAL | Status: DC | PRN

## 2016-02-11 MED ORDER — IPRATROPIUM-ALBUTEROL 0.5-2.5 (3) MG/3ML IN SOLN: 3.0000 mL | RESPIRATORY_TRACT | Status: DC | PRN

## 2016-02-11 MED ORDER — MONTELUKAST SODIUM 10 MG PO TABS
10.0000 mg | ORAL_TABLET | Freq: Every day | ORAL | Status: DC
  Administered 2016-02-11 – 2016-02-12 (×2): 10 mg via ORAL
  Filled 2016-02-11 (×2): qty 1

## 2016-02-11 MED ORDER — SODIUM CHLORIDE 0.9% FLUSH
3.0000 mL | Freq: Two times a day (BID) | INTRAVENOUS | Status: DC
  Administered 2016-02-11 – 2016-02-13 (×6): 3 mL via INTRAVENOUS

## 2016-02-11 MED ORDER — BENZONATATE 100 MG PO CAPS: 200.0000 mg | ORAL_CAPSULE | Freq: Three times a day (TID) | ORAL | Status: DC | PRN

## 2016-02-11 MED ORDER — LEVOFLOXACIN IN D5W 750 MG/150ML IV SOLN
750.0000 mg | INTRAVENOUS | Status: DC
  Administered 2016-02-12: 750 mg via INTRAVENOUS
  Filled 2016-02-11 (×2): qty 150

## 2016-02-11 MED ORDER — ACETAMINOPHEN 325 MG PO TABS: 650.0000 mg | ORAL_TABLET | Freq: Four times a day (QID) | ORAL | Status: DC | PRN

## 2016-02-11 MED ORDER — INFLUENZA VAC SPLIT QUAD 0.5 ML IM SUSY
0.5000 mL | PREFILLED_SYRINGE | INTRAMUSCULAR | Status: AC
  Administered 2016-02-13: 0.5 mL via INTRAMUSCULAR
  Filled 2016-02-11: qty 0.5

## 2016-02-11 MED ORDER — ENOXAPARIN SODIUM 40 MG/0.4ML ~~LOC~~ SOLN
40.0000 mg | SUBCUTANEOUS | Status: DC
  Administered 2016-02-11 – 2016-02-12 (×2): 40 mg via SUBCUTANEOUS
  Filled 2016-02-11 (×2): qty 0.4

## 2016-02-11 MED ORDER — ONDANSETRON HCL 4 MG/2ML IJ SOLN: 4.0000 mg | Freq: Four times a day (QID) | INTRAMUSCULAR | Status: DC | PRN

## 2016-02-11 MED ORDER — POTASSIUM CHLORIDE CRYS ER 20 MEQ PO TBCR
40.0000 meq | EXTENDED_RELEASE_TABLET | Freq: Once | ORAL | Status: AC
  Administered 2016-02-11: 40 meq via ORAL
  Filled 2016-02-11: qty 2

## 2016-02-11 MED ORDER — OXYCODONE HCL 5 MG PO TABS: 5.0000 mg | ORAL_TABLET | ORAL | Status: DC | PRN

## 2016-02-11 MED ORDER — GUAIFENESIN ER 600 MG PO TB12
600.0000 mg | ORAL_TABLET | Freq: Two times a day (BID) | ORAL | Status: DC
  Administered 2016-02-11 – 2016-02-13 (×5): 600 mg via ORAL
  Filled 2016-02-11 (×5): qty 1

## 2016-02-11 MED ORDER — LEVOTHYROXINE SODIUM 125 MCG PO TABS
125.0000 ug | ORAL_TABLET | Freq: Every day | ORAL | Status: DC
  Administered 2016-02-11 – 2016-02-13 (×3): 125 ug via ORAL
  Filled 2016-02-11 (×3): qty 1

## 2016-02-11 NOTE — Progress Notes (Signed)
A&O. Independent. Admitted from home with shortness of breath and wheezing. On O2 at 3.5 L. On 2L PRN at home. Iv antibiotics and solu-medrol given.

## 2016-02-11 NOTE — Progress Notes (Signed)
[facility] [place] NAME: [name]    MR#:  [id number]  DATE OF BIRTH:  Apr 24, 1975  SUBJECTIVE:  CHIEF COMPLAINT:   Chief Complaint  Patient presents with  . Shortness of Breath  Patient is 40 year old Caucasian female assessment: History significant for history of ongoing tobacco abuse, chronic respiratory failure, on oxygen at home at nighttime, who presents to the hospital with complaints of cough, shortness of breath,, thick dark gray sputum production. She feels a little bit better today since initiation of steroids, inhalation therapy, antibiotics with levofloxacin  Review of Systems  Constitutional: Negative for chills, fever and weight loss.  HENT: Negative for congestion.   Eyes: Negative for blurred vision and double vision.  Respiratory: Positive for cough, sputum production, shortness of breath and wheezing.   Cardiovascular: Negative for chest pain, palpitations, orthopnea, leg swelling and PND.  Gastrointestinal: Negative for abdominal pain, blood in stool, constipation, diarrhea, nausea and vomiting.  Genitourinary: Negative for dysuria, frequency, hematuria and urgency.  Musculoskeletal: Negative for falls.  Neurological: Negative for dizziness, tremors, focal weakness and headaches.  Endo/Heme/Allergies: Does not bruise/bleed easily.  Psychiatric/Behavioral: Negative for depression. The patient does not have insomnia.     VITAL SIGNS: Blood pressure (!) 108/55, pulse 81, temperature 97.5 F (36.4 C), temperature source Oral, resp. rate (!) 21, height 5\' 5"  (1.651 m), weight 97.5 kg (215 lb), last menstrual period 01/27/2016, SpO2 95 %.  PHYSICAL EXAMINATION:   GENERAL:  40 y.o.-year-old patient lying in the bed in mild respiratory distress, tachypnea, intermittent cough.  EYES: Pupils equal, round, reactive to light and accommodation. No scleral icterus. Extraocular muscles intact.  HEENT: Head  atraumatic, normocephalic. Oropharynx and nasopharynx clear.  NECK:  Supple, no jugular venous distention. No thyroid enlargement, no tenderness.  LUNGS: Diminished breath sounds bilaterally, scattered inspiratory and asked expiratory wheezing, few rales,rhonchi , no crepitations. Intermittent use of accessory muscles of respiration.  CARDIOVASCULAR: S1, S2 normal. No murmurs, rubs, or gallops.  ABDOMEN: Soft, nontender, nondistended. Bowel sounds present. No organomegaly or mass.  EXTREMITIES: No pedal edema, cyanosis, or clubbing.  NEUROLOGIC: Cranial nerves II through XII are intact. Muscle strength 5/5 in all extremities. Sensation intact. Gait not checked.  PSYCHIATRIC: The patient is alert and oriented x 3.  SKIN: No obvious rash, lesion, or ulcer.   ORDERS/RESULTS REVIEWED:   CBC  Recent Labs Lab 02/10/16 2209 02/11/16 0454  WBC 4.7 3.9  HGB 10.6* 9.9*  HCT 33.3* 30.2*  PLT 247 219  MCV 75.6* 76.5*  MCH 24.1* 25.0*  MCHC 31.9* 32.6  RDW 16.5* 16.5*   ------------------------------------------------------------------------------------------------------------------  Chemistries   Recent Labs Lab 02/10/16 2209 02/11/16 0454  NA 137 139  K 3.1* 4.1  CL 107 112*  CO2 25 22  GLUCOSE 88 153*  BUN 16 12  CREATININE 0.68 0.70  CALCIUM 8.5* 7.8*   ------------------------------------------------------------------------------------------------------------------ estimated creatinine clearance is 106.9 mL/min (by C-G formula based on SCr of 0.7 mg/dL). ------------------------------------------------------------------------------------------------------------------ No results for input(s): TSH, T4TOTAL, T3FREE, THYROIDAB in the last 72 hours.  Invalid input(s): FREET3  Cardiac Enzymes  Recent Labs Lab 02/10/16 2209  TROPONINI <0.03   ------------------------------------------------------------------------------------------------------------------ Invalid input(s):  POCBNP ---------------------------------------------------------------------------------------------------------------  RADIOLOGY: Dg Chest 2 View  Result Date: 02/10/2016 CLINICAL DATA:  Dyspnea for a few days.  Wheezing and body aches. EXAM: CHEST  2 VIEW COMPARISON:  CXR exams from 11/10/2014 and 06/24/2014 FINDINGS: The heart size and mediastinal contours are within normal limits. There  is bibasilar atelectasis and/or scarring more so in the lingula and left lung base. Small focus of airspace opacity at the right lung base cannot exclude a subtle pneumonia. The visualized skeletal structures are unremarkable. IMPRESSION: Lingular and left lower lobe atelectasis/scarring. Small focus of airspace opacity in the right lung base cannot exclude pneumonia. Electronically Signed   By: Ashley Royalty [name]   On: 02/10/2016 22:14    EKG:  Orders placed or performed during the hospital encounter of 11/10/14  . ED EKG  . ED EKG  . ED EKG  . ED EKG  . EKG    ASSESSMENT AND PLAN:  Principal Problem:   COPD exacerbation ([place]) Active Problems:   CAP (community acquired pneumonia)   Hypokalemia   Hypothyroidism #1. Acute on chronic respiratory failure with hypoxia due to COPD exacerbation, right lung base pneumonia, continue levofloxacin, get sputum cultures, just antibiotics depending on culture results, continue steroids, inhalation therapy, nebulizers #2. Right lower lobe pneumonia, continue levofloxacin, get sputum culture, just antibiotics depending ON culture results, blood cultures are negative so far #3. Hypokalemia, resolved #4 anemia, stable, get Hemoccult #5. Tobacco abuse. Counseling, discussed this patient for 3 minutes, nicotine replacement therapy will be initiated   Management plans discussed with the patient, family and they are in agreement.   DRUG ALLERGIES: No Known Allergies  CODE STATUS:     Code Status Orders        Start     Ordered   02/11/16 0056  Full code   Continuous     02/11/16 0055    Code Status History    Date Active Date Inactive Code Status Order ID Comments User Context   11/10/2014  4:16 PM 11/12/2014  5:45 PM Full Code [id number]  Epifanio Lesches, MD ED   01/19/2013 10:34 PM 01/22/2013  6:00 PM Full Code [id number]  Bonnielee Haff, MD ED   01/14/2012 11:30 PM 01/15/2012 10:00 PM Full Code [id number]  Kalman Drape, MD ED   11/10/2011  3:01 AM 11/10/2011 10:03 PM Full Code [id number]  Sharyon Cable, MD ED   09/10/2011  7:43 AM 09/10/2011  5:25 PM Full Code [id number]  Janice Norrie, MD ED   01/28/2011  7:47 AM 02/01/2011  6:49 PM Full Code [id number]  Pollie Meyer, LPN Inpatient   [id number]  2:33 AM 01/09/2011  2:14 PM Full Code [id number]  Bella Kennedy, RN Inpatient   12/30/2010  6:09 AM 12/30/2010  8:12 PM Full Code [id number]  Johnna Acosta, MD ED      TOTAL TIME TAKING CARE OF THIS PATIENT: 35 minutes.    Theodoro Grist M.D on 02/11/2016 at 3:01 PM  Between 7am to 6pm - Pager - [phone]  After 6pm go to [email or web address] - password [email or web address] [facility]  [facility]  Office  [phone]  CC: Primary care physician; Nat Christen, PA-C

## 2016-02-12 MED ORDER — METHYLPREDNISOLONE SODIUM SUCC 125 MG IJ SOLR
60.0000 mg | Freq: Two times a day (BID) | INTRAMUSCULAR | Status: DC
  Administered 2016-02-12 – 2016-02-13 (×2): 60 mg via INTRAVENOUS
  Filled 2016-02-12 (×2): qty 2

## 2016-02-12 NOTE — Care Management Note (Signed)
Case Management Note  Patient Details  Name: CHARLESA CZARNECKI MRN: [id number] Date of Birth: 04-29-1975  Subjective/Objective:        Spoke with Seth Bake at [facility] about Ms Cuff's expired Financial Eligibility status. New financials were verified with Ms Graczyk and faxed to Seth Bake at [facility].            Action/Plan:   Expected Discharge Date:                  Expected Discharge Plan:     In-House Referral:     Discharge planning Services     Post Acute Care Choice:    Choice offered to:     DME Arranged:    DME Agency:     HH Arranged:    HH Agency:     Status of Service:     If discussed at [employer] of Stay Meetings, dates discussed:    Additional Comments:  Graesyn Schreifels A, RN 02/12/2016, 4:35 PM

## 2016-02-12 NOTE — Progress Notes (Signed)
[facility] [place] NAME: [name]    MR#:  [id number]  DATE OF BIRTH:  09/20/1975  SUBJECTIVE: Patient says that she feels better than yesterday.  CHIEF COMPLAINT:   Chief Complaint  Patient presents with  . Shortness of Breath  Patient is 40 year old Caucasian female assessment: History significant for history of ongoing tobacco abuse, chronic respiratory failure, on oxygen at home at nighttime, who presents to the hospital with complaints of cough, shortness of breath,, thick dark gray sputum production. He did for COPD exacerbation, pneumonia.  Review of Systems  Constitutional: Negative for chills, fever and weight loss.  HENT: Negative for congestion.   Eyes: Negative for blurred vision and double vision.  Respiratory: Positive for wheezing. Negative for cough, sputum production and shortness of breath.   Cardiovascular: Negative for chest pain, palpitations, orthopnea, leg swelling and PND.  Gastrointestinal: Negative for abdominal pain, blood in stool, constipation, diarrhea, nausea and vomiting.  Genitourinary: Negative for dysuria, frequency, hematuria and urgency.  Musculoskeletal: Negative for falls.  Neurological: Negative for dizziness, tremors, focal weakness and headaches.  Endo/Heme/Allergies: Does not bruise/bleed easily.  Psychiatric/Behavioral: Negative for depression. The patient does not have insomnia.     VITAL SIGNS: Blood pressure 111/67, pulse 87, temperature 97.3 F (36.3 C), temperature source Oral, resp. rate 18, height 5\' 5"  (1.651 m), weight 97.5 kg (215 lb), last menstrual period 01/27/2016, SpO2 96 %.  PHYSICAL EXAMINATION:   GENERAL:  40 y.o.-year-old patient lying in the bed in mild respiratory distress, tachypnea, intermittent cough.  EYES: Pupils equal, round, reactive to light and accommodation. No scleral icterus. Extraocular muscles intact.  HEENT: Head atraumatic, normocephalic.  Oropharynx and nasopharynx clear.  NECK:  Supple, no jugular venous distention. No thyroid enlargement, no tenderness.  LUNGS: Faint expiratory wheeze in all lung fields. CARDIOVASCULAR: S1, S2 normal. No murmurs, rubs, or gallops.  ABDOMEN: Soft, nontender, nondistended. Bowel sounds present. No organomegaly or mass.  EXTREMITIES: No pedal edema, cyanosis, or clubbing.  NEUROLOGIC: Cranial nerves II through XII are intact. Muscle strength 5/5 in all extremities. Sensation intact. Gait not checked.  PSYCHIATRIC: The patient is alert and oriented x 3.  SKIN: No obvious rash, lesion, or ulcer.   ORDERS/RESULTS REVIEWED:   CBC  Recent Labs Lab 02/10/16 2209 02/11/16 0454  WBC 4.7 3.9  HGB 10.6* 9.9*  HCT 33.3* 30.2*  PLT 247 219  MCV 75.6* 76.5*  MCH 24.1* 25.0*  MCHC 31.9* 32.6  RDW 16.5* 16.5*   ------------------------------------------------------------------------------------------------------------------  Chemistries   Recent Labs Lab 02/10/16 2209 02/11/16 0454  NA 137 139  K 3.1* 4.1  CL 107 112*  CO2 25 22  GLUCOSE 88 153*  BUN 16 12  CREATININE 0.68 0.70  CALCIUM 8.5* 7.8*   ------------------------------------------------------------------------------------------------------------------ estimated creatinine clearance is 106.9 mL/min (by C-G formula based on SCr of 0.7 mg/dL). ------------------------------------------------------------------------------------------------------------------ No results for input(s): TSH, T4TOTAL, T3FREE, THYROIDAB in the last 72 hours.  Invalid input(s): FREET3  Cardiac Enzymes  Recent Labs Lab 02/10/16 2209  TROPONINI <0.03   ------------------------------------------------------------------------------------------------------------------ Invalid input(s): POCBNP ---------------------------------------------------------------------------------------------------------------  RADIOLOGY: Dg Chest 2 View  Result Date:  02/10/2016 CLINICAL DATA:  Dyspnea for a few days.  Wheezing and body aches. EXAM: CHEST  2 VIEW COMPARISON:  CXR exams from 11/10/2014 and 06/24/2014 FINDINGS: The heart size and mediastinal contours are within normal limits. There is bibasilar atelectasis and/or scarring more so in the lingula and left lung base. Small focus of airspace  opacity at the right lung base cannot exclude a subtle pneumonia. The visualized skeletal structures are unremarkable. IMPRESSION: Lingular and left lower lobe atelectasis/scarring. Small focus of airspace opacity in the right lung base cannot exclude pneumonia. Electronically Signed   By: Ashley Royalty [name]   On: 02/10/2016 22:14    EKG:  Orders placed or performed during the hospital encounter of 11/10/14  . ED EKG  . ED EKG  . ED EKG  . ED EKG  . EKG    ASSESSMENT AND PLAN:  Principal Problem:   COPD exacerbation ([place]) Active Problems:   CAP (community acquired pneumonia)   Hypokalemia   Hypothyroidism #1. Acute on chronic respiratory failure with hypoxia due to COPD exacerbation, right lung base pneumonia, continue levofloxacin, sputum cultures Showed diplococci, gram-positive cocci, wait  for full culture data just antibiotics depending on culture results, continue steroids, inhalation therapy, nebulizers  #2. Right lower lobe pneumonia, continue levofloxacin,  , blood cultures are negative so far #3. Hypokalemia, resolved #4 anemia, stable, #5. Tobacco abuse. Counseling, discussed this patient for 3 minutes, nicotine replacement therapy will be initiated  Likely discharge tomorrow.  Management plans discussed with the patient, family and they are in agreement.   DRUG ALLERGIES: No Known Allergies  CODE STATUS:     Code Status Orders        Start     Ordered   02/11/16 0056  Full code  Continuous     02/11/16 0055    Code Status History    Date Active Date Inactive Code Status Order ID Comments User Context   11/10/2014  4:16 PM  11/12/2014  5:45 PM Full Code [id number]  Epifanio Lesches, MD ED   01/19/2013 10:34 PM 01/22/2013  6:00 PM Full Code [id number]  Bonnielee Haff, MD ED   01/14/2012 11:30 PM 01/15/2012 10:00 PM Full Code [id number]  Kalman Drape, MD ED   11/10/2011  3:01 AM 11/10/2011 10:03 PM Full Code [id number]  Sharyon Cable, MD ED   09/10/2011  7:43 AM 09/10/2011  5:25 PM Full Code [id number]  Janice Norrie, MD ED   01/28/2011  7:47 AM 02/01/2011  6:49 PM Full Code [id number]  Pollie Meyer, LPN Inpatient   [id number]  2:33 AM 01/09/2011  2:14 PM Full Code [id number]  Bella Kennedy, RN Inpatient   12/30/2010  6:09 AM 12/30/2010  8:12 PM Full Code [id number]  Johnna Acosta, MD ED      TOTAL TIME TAKING CARE OF THIS PATIENT: 35 minutes.    Epifanio Lesches M.D on 02/12/2016 at 12:04 PM  Between 7am to 6pm - Pager - [phone]  After 6pm go to [email or web address] - password [email or web address]  [facility]  Office  [phone]  CC: Primary care physician; Nat Christen, PA-C

## 2016-02-12 NOTE — Care Management Note (Signed)
Case Management Note  Patient Details  Name: [name] MRN: [id number] Date of Birth: 08/13/1975  Subjective/Objective:      [name] reports problems with her home oxygen, something about a knob on the concentrator and something about the new tubing not working. This Probation officer called [employer] and was told that [name] herself or a family member would have to call them to report a problem with equipment already in the home. This Probation officer provided [name] with the phone number of [employer] and the extension number of the DME department. [name] stated "They tried to charge me the last time they came out to my home." This writer encoyuragewd [name] to discuss any concerns about charges that she has with [employer] when she calls them about her report of malfunctioning oxygen equipment.               Action/Plan:   Expected Discharge Date:                  Expected Discharge Plan:     In-House Referral:     Discharge planning Services     Post Acute Care Choice:    Choice offered to:     DME Arranged:    DME Agency:     HH Arranged:    HH Agency:     Status of Service:     If discussed at [employer] of Stay Meetings, dates discussed:    Additional Comments:  Araiya Tilmon A, RN 02/12/2016, 2:50 PM

## 2016-02-13 MED ORDER — AZITHROMYCIN 500 MG PO TABS: 500.0000 mg | ORAL_TABLET | Freq: Every day | ORAL | 0 refills | Status: DC

## 2016-02-13 MED ORDER — LEVOFLOXACIN 750 MG PO TABS: 750.0000 mg | ORAL_TABLET | Freq: Every day | ORAL | 0 refills | Status: DC

## 2016-02-13 MED ORDER — LEVOFLOXACIN 750 MG PO TABS
750.0000 mg | ORAL_TABLET | Freq: Every day | ORAL | Status: DC
  Administered 2016-02-13: 750 mg via ORAL
  Filled 2016-02-13: qty 1

## 2016-02-13 MED ORDER — PREDNISONE 10 MG (21) PO TBPK: 10.0000 mg | ORAL_TABLET | Freq: Every day | ORAL | 0 refills | Status: DC

## 2016-02-13 NOTE — Progress Notes (Signed)
Patient discharge teaching given, including activity, diet, follow-up appoints, and medications. Patient verbalized understanding of all discharge instructions. IV accesses was d/c'd. Vitals are stable. Skin is intact except as charted in most recent assessments. Pt to be escorted out by RN, to be driven home by family.  Elek Holderness [employer]

## 2016-02-13 NOTE — Progress Notes (Signed)
PHARMACIST - PHYSICIAN COMMUNICATION CONCERNING: Antibiotic IV to Oral Route Change Policy  RECOMMENDATION: This patient is receiving Levofloxacin by the intravenous route.  Based on criteria approved by the Pharmacy and Therapeutics Committee, the antibiotic(s) is/are being converted to the equivalent oral dose form(s).   DESCRIPTION: These criteria include:  Patient being treated for a respiratory tract infection, urinary tract infection, cellulitis or clostridium difficile associated diarrhea if on metronidazole  The patient is not neutropenic and does not exhibit a GI malabsorption state  The patient is eating (either orally or via tube) and/or has been taking other orally administered medications for a least 24 hours  The patient is improving clinically and has a Tmax < 100.5   Tuyet Bader K, [facility] 02/13/2016

## 2016-02-13 NOTE — Progress Notes (Signed)
     [name] was admitted to the Hospital on 02/10/2016 and Discharged  02/13/2016 and should be excused from work/school   for 4 days starting 02/10/2016 , may return to work/school ,but can do light activities,advised to stay away from smoke inhalation that can irrtate lungs and cause SOB,   Livingston Denner M.D on 02/13/2016,at 11:11 AM

## 2016-02-14 LAB — CULTURE, RESPIRATORY W GRAM STAIN

## 2016-02-14 LAB — CULTURE, RESPIRATORY: CULTURE: NORMAL

## 2016-02-14 NOTE — Progress Notes (Signed)
[place] [facility] [place] NAME: [name]    MR#:  [id number]  DATE OF BIRTH:  [birth year]  SUBJECTIVE: Patient says that she feels better than yesterday. Wants to go home. CHIEF COMPLAINT:   Chief Complaint  Patient presents with  . Shortness of Breath  Patient is 41 year old Caucasian female assessment: History significant for history of ongoing tobacco abuse, chronic respiratory failure, on oxygen at home at nighttime, who presents to the hospital with complaints of cough, shortness of breath,, thick dark gray sputum production.admitted for COPD exacerbation, pneumonia.  Review of Systems  Constitutional: Negative for chills, fever and weight loss.  HENT: Negative for congestion.   Eyes: Negative for blurred vision and double vision.  Respiratory: Positive for wheezing. Negative for cough, sputum production and shortness of breath.   Cardiovascular: Negative for chest pain, palpitations, orthopnea, leg swelling and PND.  Gastrointestinal: Negative for abdominal pain, blood in stool, constipation, diarrhea, nausea and vomiting.  Genitourinary: Negative for dysuria, frequency, hematuria and urgency.  Musculoskeletal: Negative for falls.  Neurological: Negative for dizziness, tremors, focal weakness and headaches.  Endo/Heme/Allergies: Does not bruise/bleed easily.  Psychiatric/Behavioral: Negative for depression. The patient does not have insomnia.     VITAL SIGNS: Blood pressure 99/75, pulse 63, temperature 97.8 F (36.6 C), temperature source Oral, resp. rate 18, height 5\' 5"  (1.651 m), weight 97.5 kg (215 lb), last menstrual period 01/27/2016, SpO2 97 %.  PHYSICAL EXAMINATION:   GENERAL:  41 y.o.-year-old patient lying in the bed in mild respiratory distress, tachypnea, intermittent cough.  EYES: Pupils equal, round, reactive to light and accommodation. No scleral icterus. Extraocular muscles intact.  HEENT: Head atraumatic,  normocephalic. Oropharynx and nasopharynx clear.  NECK:  Supple, no jugular venous distention. No thyroid enlargement, no tenderness.  LUNGS: Faint expiratory wheeze in all lung fields. CARDIOVASCULAR: S1, S2 normal. No murmurs, rubs, or gallops.  ABDOMEN: Soft, nontender, nondistended. Bowel sounds present. No organomegaly or mass.  EXTREMITIES: No pedal edema, cyanosis, or clubbing.  NEUROLOGIC: Cranial nerves II through XII are intact. Muscle strength 5/5 in all extremities. Sensation intact. Gait not checked.  PSYCHIATRIC: The patient is alert and oriented x 3.  SKIN: No obvious rash, lesion, or ulcer.   ORDERS/RESULTS REVIEWED:   CBC  Recent Labs Lab 02/10/16 2209 02/11/16 0454  WBC 4.7 3.9  HGB 10.6* 9.9*  HCT 33.3* 30.2*  PLT 247 219  MCV 75.6* 76.5*  MCH 24.1* 25.0*  MCHC 31.9* 32.6  RDW 16.5* 16.5*   ------------------------------------------------------------------------------------------------------------------  Chemistries   Recent Labs Lab 02/10/16 2209 02/11/16 0454  NA 137 139  K 3.1* 4.1  CL 107 112*  CO2 25 22  GLUCOSE 88 153*  BUN 16 12  CREATININE 0.68 0.70  CALCIUM 8.5* 7.8*   ------------------------------------------------------------------------------------------------------------------ estimated creatinine clearance is 106.9 mL/min (by C-G formula based on SCr of 0.7 mg/dL). ------------------------------------------------------------------------------------------------------------------ No results for input(s): TSH, T4TOTAL, T3FREE, THYROIDAB in the last 72 hours.  Invalid input(s): FREET3  Cardiac Enzymes  Recent Labs Lab 02/10/16 2209  TROPONINI <0.03   ------------------------------------------------------------------------------------------------------------------ Invalid input(s): POCBNP ---------------------------------------------------------------------------------------------------------------  RADIOLOGY: No results  found.  EKG:  Orders placed or performed during the hospital encounter of 11/10/14  . ED EKG  . ED EKG  . ED EKG  . ED EKG  . EKG    ASSESSMENT AND PLAN:  Principal Problem:   COPD exacerbation ([place]) Active Problems:   CAP (community acquired pneumonia)   Hypokalemia   Hypothyroidism #  1. Acute on chronic respiratory failure with hypoxia due to COPD exacerbation, right lung base pneumonia, c  #2. Right lower lobe pneumonia, continue levofloxacin,  , blood cultures are negative so far. Discharge home today with levaquin ,steroids. And continue duonebs. #3. Hypokalemia, resolved #4 anemia, stable, #5. Tobacco abuse. Counseling, discussed this patient for 3 minutes, nicotine replacement therapy will be initiated D/c home today. Management plans discussed with the patient, family and they are in agreement.   DRUG ALLERGIES: No Known Allergies  CODE STATUS:     Code Status Orders        Start     Ordered   02/11/16 0056  Full code  Continuous     02/11/16 0055    Code Status History    Date Active Date Inactive Code Status Order ID Comments User Context   11/10/2014  4:16 PM 11/12/2014  5:45 PM Full Code [id number]  Epifanio Lesches, MD ED   01/19/2013 10:34 PM 01/22/2013  6:00 PM Full Code [id number]  Bonnielee Haff, MD ED   01/14/2012 11:30 PM 01/15/2012 10:00 PM Full Code [id number]  Kalman Drape, MD ED   11/10/2011  3:01 AM 11/10/2011 10:03 PM Full Code [id number]  Sharyon Cable, MD ED   09/10/2011  7:43 AM 09/10/2011  5:25 PM Full Code [id number]  Janice Norrie, MD ED   01/28/2011  7:47 AM 02/01/2011  6:49 PM Full Code [id number]  Pollie Meyer, LPN Inpatient   [id number]  2:33 AM 01/09/2011  2:14 PM Full Code [id number]  Bella Kennedy, RN Inpatient   12/30/2010  6:09 AM 12/30/2010  8:12 PM Full Code [id number]  Johnna Acosta, MD ED      TOTAL TIME TAKING CARE OF THIS PATIENT: 35 minutes.    Epifanio Lesches M.D on 02/14/2016 at 1:28 AM  Between 7am to 6pm - Pager -  [phone]  After 6pm go to [email or web address] - password [email or web address] [facility]  [facility]  Office  [phone]  CC: Primary care physician; Nat Christen, PA-C

## 2016-02-16 LAB — CULTURE, BLOOD (ROUTINE X 2)
CULTURE: NO GROWTH
Culture: NO GROWTH

## 2016-02-16 NOTE — Discharge Summary (Signed)
[name], is a 41 y.o. female  DOB 22-Sep-1975  MRN [id number].  Admission date:  02/10/2016  Admitting Physician  Lance Coon, MD  Discharge Date:  02/13/2016  Primary MD  Nat Christen, PA-C  Recommendations for primary care physician for things to follow:     Admission Diagnosis  shortness of breath   Discharge Diagnosis  shortness of breath    Principal Problem:   COPD exacerbation ([place]) Active Problems:   CAP (community acquired pneumonia)   Hypokalemia   Hypothyroidism      Past Medical History:  Diagnosis Date  . Alcohol abuse   . Anemia 01/31/2011  . Asthma   . COPD (chronic obstructive pulmonary disease) ([place])   . Depression   . Hypoxia 02/01/2011  . Needs sleep apnea assessment 2013.07.31  . Tobacco abuse     Past Surgical History:  Procedure Laterality Date  . ANKLE GANGLION CYST EXCISION    . BACK SURGERY    . CESAREAN SECTION    . DILATION AND CURETTAGE OF UTERUS         History of present illness and  Hospital Course:     Kindly see H&P for history of present illness and admission details, please review complete Labs, Consult reports and Test reports for all details in brief  HPI  from the history and physical done on the day of admission 41 year old female patient with history of asthma, COPD, continued tobacco abuse comes in because of shortness of breath, cough, myalgias. Patient chest x-ray on admission showed right lung [place]. Admitted to hospitalist service for COPD exacerbation, pneumonia.   Hospital Course  1 community-acquired pneumonia, COPD exacerbation: Patient received IV steroids, nebulizers, Levaquin. Continued on oxygen, patient on oxygen 2 L at home.symptoms improved. shortness of breath, cough improved. Patient had trouble with oxygen tubing at home  and patient contacted the oxygen company. #2. Hypokalemia ;replace the potassium #/3 hypothyroidism ;continueSynthyroid.     Discharge Condition: Stable   Follow UP  Follow-up Information    Nat Christen, PA-C Follow up in 1 week(s).   Specialty:  Physician Assistant Contact information: [place] [address] [phone]             Discharge Instructions  and  Discharge Medications     Allergies as of 02/13/2016   No Known Allergies     Medication List    STOP taking these medications   dextromethorphan 15 MG/5ML syrup   ipratropium-albuterol 0.5-2.5 (3) MG/3ML Soln Commonly known as:  DUONEB   predniSONE 10 MG tablet Commonly known as:  DELTASONE Replaced by:  predniSONE 10 MG (21) Tbpk tablet     TAKE these medications   albuterol 108 (90 Base) MCG/ACT inhaler Commonly known as:  PROVENTIL HFA;VENTOLIN HFA Inhale 1-2 puffs into the lungs every 6 (six) hours as needed for wheezing or shortness of breath.   albuterol (2.5 MG/3ML) 0.083% nebulizer solution Commonly known as:  PROVENTIL Take 3 mLs (2.5 mg total) by nebulization every 6 (six) hours as needed for wheezing or shortness of breath. For asthma   azithromycin 500 MG tablet Commonly known as:  ZITHROMAX Take 1 tablet (500 mg total) by mouth daily.   budesonide-formoterol 160-4.5 MCG/ACT inhaler Commonly known as:  SYMBICORT Inhale 2 puffs into the lungs 2 (two) times daily.   cyclobenzaprine 10 MG tablet Commonly known as:  FLEXERIL Take 1 tablet (10 mg total) by mouth 3 (three) times daily.   Fluticasone-Salmeterol  250-50 MCG/DOSE Aepb Commonly known as:  ADVAIR Inhale 1 puff into the lungs 2 (two) times daily.   levofloxacin 750 MG tablet Commonly known as:  LEVAQUIN Take 1 tablet (750 mg total) by mouth daily.   levothyroxine 125 MCG tablet Commonly known as:  SYNTHROID, LEVOTHROID Take 125 mcg by mouth daily before breakfast.   montelukast 10 MG  tablet Commonly known as:  SINGULAIR Take 10 mg by mouth at bedtime.   predniSONE 10 MG (21) Tbpk tablet Commonly known as:  STERAPRED UNI-PAK 21 TAB Take 1 tablet (10 mg total) by mouth daily. Replaces:  predniSONE 10 MG tablet         Diet and Activity recommendation: See Discharge Instructions above   Consults obtained -Case manager   Major procedures and Radiology Reports - PLEASE review detailed and final reports for all details, in brief -      Dg Chest 2 View  Result Date: 02/10/2016 CLINICAL DATA:  Dyspnea for a few days.  Wheezing and body aches. EXAM: CHEST  2 VIEW COMPARISON:  CXR exams from 11/10/2014 and 06/24/2014 FINDINGS: The heart size and mediastinal contours are within normal limits. There is bibasilar atelectasis and/or scarring more so in the lingula and left lung base. Small focus of airspace opacity at the right lung base cannot exclude a subtle pneumonia. The visualized skeletal structures are unremarkable. IMPRESSION: Lingular and left lower lobe atelectasis/scarring. Small focus of airspace opacity in the right lung base cannot exclude pneumonia. Electronically Signed   By: Ashley Royalty [name]   On: 02/10/2016 22:14    Micro Results     Recent Results (from the past 240 hour(s))  Blood culture (routine x 2)     Status: None   Collection Time: 02/10/16 11:54 PM  Result Value Ref Range Status   Specimen Description BLOOD  R HAND  Final   Special Requests   Final    BOTTLES DRAWN AEROBIC AND ANAEROBIC  AER 10 ML ANA 7 ML   Culture NO GROWTH 5 DAYS  Final   Report Status 02/16/2016 FINAL  Final  Blood culture (routine x 2)     Status: None   Collection Time: 02/10/16 11:55 PM  Result Value Ref Range Status   Specimen Description BLOOD  R AC  Final   Special Requests   Final    BOTTLES DRAWN AEROBIC AND ANAEROBIC  AER 10 ML ANA 7 ML   Culture NO GROWTH 5 DAYS  Final   Report Status 02/16/2016 FINAL  Final  Culture, sputum-assessment     Status:  None   Collection Time: 02/11/16 10:19 AM  Result Value Ref Range Status   Specimen Description SPU  Final   Special Requests NONE  Final   Sputum evaluation THIS SPECIMEN IS ACCEPTABLE FOR SPUTUM CULTURE  Final   Report Status 02/11/2016 FINAL  Final  Culture, respiratory (NON-Expectorated)     Status: None   Collection Time: 02/11/16 10:19 AM  Result Value Ref Range Status   Specimen Description SPU  Final   Special Requests NONE Reflexed from [id number]  Final   Gram Stain   Final    MODERATE WBC PRESENT,BOTH PMN AND MONONUCLEAR MODERATE GRAM POSITIVE COCCI IN CLUSTERS FEW GRAM POSITIVE COCCI IN PAIRS FEW YEAST RARE GRAM NEGATIVE DIPLOCOCCI    Culture   Final    Consistent with normal respiratory flora. Performed at [facility]    Report Status 02/14/2016 FINAL  Final  Today   Subjective:   [name] today has no headache,no chest abdominal pain,no new weakness tingling or numbness, feels much better wants to go home today.   Objective:   Blood pressure 99/75, pulse 63, temperature 97.8 F (36.6 C), temperature source Oral, resp. rate 18, height 5\' 5"  (1.651 m), weight 97.5 kg (215 lb), last menstrual period 01/27/2016, SpO2 97 %.  No intake or output data in the 24 hours ending 02/16/16 1438  Exam Awake Alert, Oriented x 3, No new F.N deficits, Normal affect [place].AT,PERRAL Supple Neck,No JVD, No cervical lymphadenopathy appriciated.  Symmetrical Chest wall movement, Good air movement bilaterally, CTAB RRR,No Gallops,Rubs or new Murmurs, No Parasternal Heave +ve B.Sounds, Abd Soft, Non tender, No organomegaly appriciated, No rebound -guarding or rigidity. No Cyanosis, Clubbing or edema, No new Rash or bruise  Data Review   CBC w Diff:  Lab Results  Component Value Date   WBC 3.9 02/11/2016   HGB 9.9 (L) 02/11/2016   HGB 11.6 (L) 05/13/2013   HCT 30.2 (L) 02/11/2016   HCT 36.2 05/13/2013   PLT 219 02/11/2016   PLT 264 05/13/2013    LYMPHOPCT 20.3 05/13/2013   MONOPCT 7.2 05/13/2013   EOSPCT 0.0 05/13/2013   BASOPCT 0.1 05/13/2013    CMP:  Lab Results  Component Value Date   NA 139 02/11/2016   NA 136 05/11/2013   K 4.1 02/11/2016   K 4.1 05/11/2013   CL 112 (H) 02/11/2016   CL 106 05/11/2013   CO2 22 02/11/2016   CO2 27 05/11/2013   BUN 12 02/11/2016   BUN 12 05/11/2013   CREATININE 0.70 02/11/2016   CREATININE 0.71 05/15/2013   PROT 7.3 11/10/2014   PROT 7.7 05/10/2013   ALBUMIN 4.2 11/10/2014   ALBUMIN 3.8 05/10/2013   BILITOT 0.6 11/10/2014   BILITOT 0.2 05/10/2013   ALKPHOS 62 11/10/2014   ALKPHOS 95 05/10/2013   AST 20 11/10/2014   AST 8 (L) 05/10/2013   ALT 14 11/10/2014   ALT 17 05/10/2013  .   Total Time in preparing paper work, data evaluation and todays exam - 58 minutes  Amandalynn Pitz M.D on 02/13/2016 at 2:38 PM    Note: This dictation was prepared with Dragon dictation along with smaller phrase technology. Any transcriptional errors that result from this process are unintentional.

## 2016-05-07 ENCOUNTER — Emergency Department
Admission: EM | Admit: 2016-05-07 | Discharge: 2016-05-07 | Disposition: A | Discharge status: Home / Self Care | Payer: Self-pay | Attending: Emergency Medicine | Admitting: Emergency Medicine

## 2016-05-07 ENCOUNTER — Encounter: Payer: Self-pay | Admitting: Radiology

## 2016-05-07 ENCOUNTER — Emergency Department: Payer: Self-pay

## 2016-05-07 DIAGNOSIS — F17210 Nicotine dependence, cigarettes, uncomplicated: Secondary | ICD-10-CM | POA: Insufficient documentation

## 2016-05-07 DIAGNOSIS — E039 Hypothyroidism, unspecified: Secondary | ICD-10-CM | POA: Insufficient documentation

## 2016-05-07 DIAGNOSIS — Z79899 Other long term (current) drug therapy: Secondary | ICD-10-CM | POA: Insufficient documentation

## 2016-05-07 DIAGNOSIS — R112 Nausea with vomiting, unspecified: Secondary | ICD-10-CM | POA: Insufficient documentation

## 2016-05-07 DIAGNOSIS — R1084 Generalized abdominal pain: Secondary | ICD-10-CM | POA: Insufficient documentation

## 2016-05-07 DIAGNOSIS — J449 Chronic obstructive pulmonary disease, unspecified: Secondary | ICD-10-CM | POA: Insufficient documentation

## 2016-05-07 DIAGNOSIS — R1011 Right upper quadrant pain: Secondary | ICD-10-CM | POA: Insufficient documentation

## 2016-05-07 DIAGNOSIS — J45909 Unspecified asthma, uncomplicated: Secondary | ICD-10-CM | POA: Insufficient documentation

## 2016-05-07 LAB — COMPREHENSIVE METABOLIC PANEL
ALK PHOS: 65 U/L (ref 38–126)
ALT: 14 U/L (ref 14–54)
AST: 19 U/L (ref 15–41)
Albumin: 4.1 g/dL (ref 3.5–5.0)
Anion gap: 5 (ref 5–15)
BILIRUBIN TOTAL: 0.5 mg/dL (ref 0.3–1.2)
BUN: 7 mg/dL (ref 6–20)
CALCIUM: 9.1 mg/dL (ref 8.9–10.3)
CO2: 25 mmol/L (ref 22–32)
Chloride: 107 mmol/L (ref 101–111)
Creatinine, Ser: 0.54 mg/dL (ref 0.44–1.00)
GFR calc Af Amer: >60 mL/min (ref >60)
Glucose, Bld: 98 mg/dL (ref 65–99)
POTASSIUM: 3.9 mmol/L (ref 3.5–5.1)
Sodium: 137 mmol/L (ref 135–145)
TOTAL PROTEIN: 6.7 g/dL (ref 6.5–8.1)

## 2016-05-07 LAB — POCT PREGNANCY, URINE: Preg Test, Ur: NEGATIVE

## 2016-05-07 LAB — URINALYSIS, COMPLETE (UACMP) WITH MICROSCOPIC
BILIRUBIN URINE: NEGATIVE
GLUCOSE, UA: NEGATIVE mg/dL
Ketones, ur: NEGATIVE mg/dL
LEUKOCYTES UA: NEGATIVE
NITRITE: NEGATIVE
PH: 7 (ref 5.0–8.0)
Protein, ur: NEGATIVE mg/dL
SPECIFIC GRAVITY, URINE: 1.012 (ref 1.005–1.030)

## 2016-05-07 LAB — CBC
HEMATOCRIT: 35.8 % (ref 35.0–47.0)
Hemoglobin: 11.3 g/dL — ABNORMAL LOW (ref 12.0–16.0)
MCH: 24 pg — ABNORMAL LOW (ref 26.0–34.0)
MCHC: 31.6 g/dL — AB (ref 32.0–36.0)
MCV: 76.2 fL — ABNORMAL LOW (ref 80.0–100.0)
Platelets: 350 10*3/uL (ref 150–440)
RBC: 4.7 MIL/uL (ref 3.80–5.20)
RDW: 17.2 % — AB (ref 11.5–14.5)
WBC: 9 10*3/uL (ref 3.6–11.0)

## 2016-05-07 LAB — TROPONIN I

## 2016-05-07 LAB — LIPASE, BLOOD: Lipase: 17 U/L (ref 11–51)

## 2016-05-07 MED ORDER — IOPAMIDOL (ISOVUE-300) INJECTION 61%
100.0000 mL | Freq: Once | INTRAVENOUS | Status: AC | PRN
  Administered 2016-05-07: 100 mL via INTRAVENOUS
  Filled 2016-05-07: qty 100

## 2016-05-07 MED ORDER — ONDANSETRON HCL 4 MG/2ML IJ SOLN
4.0000 mg | Freq: Once | INTRAMUSCULAR | Status: AC
  Administered 2016-05-07: 4 mg via INTRAVENOUS
  Filled 2016-05-07: qty 2

## 2016-05-07 MED ORDER — OMEPRAZOLE 40 MG PO CPDR: 40.0000 mg | DELAYED_RELEASE_CAPSULE | Freq: Every day | ORAL | 0 refills | Status: DC

## 2016-05-07 MED ORDER — HYDROMORPHONE HCL 1 MG/ML IJ SOLN
0.5000 mg | Freq: Once | INTRAMUSCULAR | Status: AC
  Administered 2016-05-07: 0.5 mg via INTRAVENOUS
  Filled 2016-05-07: qty 1

## 2016-05-07 MED ORDER — SODIUM CHLORIDE 0.9 % IV BOLUS (SEPSIS)
1000.0000 mL | Freq: Once | INTRAVENOUS | Status: AC
  Administered 2016-05-07: 1000 mL via INTRAVENOUS

## 2016-05-07 MED ORDER — IPRATROPIUM-ALBUTEROL 0.5-2.5 (3) MG/3ML IN SOLN
3.0000 mL | Freq: Once | RESPIRATORY_TRACT | Status: AC
  Administered 2016-05-07: 3 mL via RESPIRATORY_TRACT
  Filled 2016-05-07: qty 3

## 2016-05-07 MED ORDER — ONDANSETRON 4 MG PO TBDP: 4.0000 mg | ORAL_TABLET | Freq: Three times a day (TID) | ORAL | 0 refills | Status: DC | PRN

## 2016-05-07 MED ORDER — IOPAMIDOL (ISOVUE-300) INJECTION 61%
30.0000 mL | Freq: Once | INTRAVENOUS | Status: DC | PRN
  Filled 2016-05-07: qty 30

## 2016-05-07 NOTE — ED Triage Notes (Signed)
Pt states for the past month she has been having rt sided abd pain, pt reports vomiting after eating, states that the pain radiates throughout her abd

## 2016-05-07 NOTE — Discharge Instructions (Signed)
Please start taking the omeprazole to decrease acid in your stomach. In addition, follow the instructions for bland diet to decrease your symptoms. Do not drink alcohol, caffeine, eat spicy food, or take NSAID medications.  You may take Zofran 20 minutes prior to eating to decrease vomiting. Follow up appointment with your primary care physician for further evaluation.  In addition, you may make up appointment with the gastrointestinal doctor.  Return to the emergency department if you develop severe pain, fever, inability to keep down fluids, or any other symptoms concerning to you.

## 2016-05-07 NOTE — ED Provider Notes (Signed)
[facility] Emergency Department Provider Note  ____________________________________________  Time seen: Approximately 4:12 PM  I have reviewed the triage vital signs and the nursing notes.   HISTORY  Chief Complaint Abdominal Pain    HPI [name] is a 41 y.o. female without any prior abdominal surgeries presenting with diffuse abdominal pain associated with nausea and vomiting. The patient reports for the past 2-1/2 weeks, she has had diffuse abdominal pain, worst in the right upper quadrant, with a "burning sensation." Her pain is worse after eating, and she has been experiencing postprandial nausea and vomiting. No associated fevers or chills, urinary symptoms, constipation or diarrhea. She has not had chest pain, shortness of breath more than her usual COPD, palpitations, lightheadedness or syncope.  Past Medical History:  Diagnosis Date  . Alcohol abuse   . Anemia 01/31/2011  . Asthma   . COPD (chronic obstructive pulmonary disease) ([place])   . Depression   . Hypoxia 02/01/2011  . Needs sleep apnea assessment 2013.07.31  . Tobacco abuse     Patient Active Problem List   Diagnosis Date Noted  . Hypokalemia 02/10/2016  . Hypothyroidism 02/10/2016  . CAP (community acquired pneumonia) 01/19/2013  . Dehydration 01/19/2013  . Nausea vomiting and diarrhea 01/19/2013  . Alcohol dependence ([place]) 01/16/2012  . Alcohol withdrawal ([place]) 01/16/2012  . Hypoxia 02/01/2011  . Hyponatremia 02/01/2011  . Anemia 01/31/2011  . Chest pain, pleuritic 01/29/2011  . Back pain 01/29/2011  . HCAP (healthcare-associated pneumonia) 01/28/2011  . Acute bronchitis 01/08/2011  . COPD exacerbation ([place]) 01/08/2011  . Tobacco abuse 01/08/2011  . Polysubstance abuse 01/08/2011  . Major depression 01/08/2011    Past Surgical History:  Procedure Laterality Date  . ANKLE GANGLION CYST EXCISION    . BACK SURGERY    . CESAREAN SECTION    . DILATION AND  CURETTAGE OF UTERUS      Current Outpatient Rx  . Order #: [id number] Class: Historical Med  . Order #: [id number] Class: Print  . Order #: [id number] Class: Normal  . Order #: [id number] Class: Print  . Order #: [id number] Class: Print  . Order #: [id number] Class: Historical Med  . Order #: [id number] Class: Normal  . Order #: [id number] Class: Historical Med  . Order #: [id number] Class: Historical Med  . Order #: [id number] Class: Print  . Order #: [id number] Class: Print  . Order #: [id number] Class: Normal    Allergies Patient has no known allergies.  Family History  Problem Relation Age of Onset  . Hypothyroidism Mother   . Coronary artery disease Mother     Social History Social History  Substance Use Topics  . Smoking status: Current Every Day Smoker    Packs/day: 0.50    Years: 20.00    Types: Cigarettes    Last attempt to quit: 06/19/2012  . Smokeless tobacco: Never Used  . Alcohol use 0.0 oz/week     Comment: occ    Review of Systems Constitutional: No fever/chills.No lightheadedness or syncope. Eyes: No visual changes. ENT: No sore throat. No congestion or rhinorrhea. Cardiovascular: Denies chest pain. Denies palpitations. Respiratory: Denies shortness of breath.  No cough. Gastrointestinal: Positive diffuse but worse in the right upper quadrant abdominal pain.  Positive nausea, positive vomiting.  No diarrhea.  No constipation. Genitourinary: Negative for dysuria. No urinary frequency. No change in vaginal discharge. Musculoskeletal: Negative for back pain. Skin: Negative for rash. Neurological: Negative for headaches. No focal numbness, tingling or weakness.   10-point ROS otherwise negative.  ____________________________________________   PHYSICAL EXAM:  VITAL SIGNS: ED Triage Vitals  Enc Vitals Group     BP 05/07/16 1422 (!) 114/53     Pulse Rate 05/07/16 1422 89     Resp 05/07/16 1422 18     Temp 05/07/16 1422 98.3 F (36.8 C)     Temp Source 05/07/16 1422  Oral     SpO2 05/07/16 1422 99 %     Weight 05/07/16 1425 218 lb (98.9 kg)     Height 05/07/16 1425 5\' 5"  (1.651 m)     Head Circumference --      Peak Flow --      Pain Score 05/07/16 1425 8     Pain Loc --      Pain Edu? --      Excl. in [place]? --     Constitutional: Alert and oriented. Well appearing and in no acute distress. Answers questions appropriately. Eyes: Conjunctivae are normal.  EOMI. No scleral icterus. Head: Atraumatic. Nose: No congestion/rhinnorhea. Mouth/Throat: Mucous membranes are moist.  Neck: No stridor.  Supple.   Cardiovascular: Normal rate, regular rhythm. No murmurs, rubs or gallops.  Respiratory: Normal respiratory effort.  No accessory muscle use or retractions. Lungs CTAB.  Expiratory greater than inspiratory wheezing without rales or ronchi. Gastrointestinal: Obese. Soft, and nondistended.  Tender to palpation in the right upper, right lower, and left lower quadrants. Mild epigastric tenderness to palpation. Negative Murphy sign. No guarding or rebound.  No peritoneal signs. Musculoskeletal: No LE edema. No ttp in the calves or palpable cords.  Negative Homan's sign. Neurologic:  A&Ox3.  Speech is clear.  Face and smile are symmetric.  EOMI.  Moves all extremities well. Skin:  Skin is warm, dry and intact. No rash noted. Psychiatric: Mood and affect are normal. Speech and behavior are normal.  Normal judgement.  ____________________________________________   LABS (all labs ordered are listed, but only abnormal results are displayed)  Labs Reviewed  CBC - Abnormal; Notable for the following:       Result Value   Hemoglobin 11.3 (*)    MCV 76.2 (*)    MCH 24.0 (*)    MCHC 31.6 (*)    RDW 17.2 (*)    All other components within normal limits  URINALYSIS, COMPLETE (UACMP) WITH MICROSCOPIC - Abnormal; Notable for the following:    Color, Urine YELLOW (*)    APPearance HAZY (*)    Hgb urine dipstick MODERATE (*)    Bacteria, UA RARE (*)    Squamous  Epithelial / LPF 0-5 (*)    All other components within normal limits  LIPASE, BLOOD  COMPREHENSIVE METABOLIC PANEL  TROPONIN I  POC URINE PREG, ED  POCT PREGNANCY, URINE   ____________________________________________  EKG  ED ECG REPORT I, Eula Listen, the attending physician, personally viewed and interpreted this ECG.   Date: 05/07/2016  EKG Time: 1433  Rate: 79  Rhythm: normal sinus rhythm  Axis: normal  Intervals:none  ST&T Change: Specific T-wave inversion in V1. No ST elevation.  ____________________________________________  RADIOLOGY  Ct Abdomen Pelvis W Contrast  Result Date: 05/07/2016 CLINICAL DATA:  Pt with abd/pelvic pain that started 2 weeks ago. Pt states N/V after eating that has been going on the same time. 130ml of Isovue 300 used.^149mL ISOVUE-300 IOPAMIDOL (ISOVUE-300) INJECTION 61% EXAM: CT ABDOMEN AND PELVIS WITH CONTRAST TECHNIQUE: Multidetector CT imaging of the abdomen and pelvis was performed using the standard protocol following bolus administration of intravenous contrast. CONTRAST:  177mL ISOVUE-300 IOPAMIDOL (ISOVUE-300) INJECTION 61%  COMPARISON:  01/28/2012 FINDINGS: Lower chest: There is subsegmental atelectasis at the right lung base. Heart size is normal. Hepatobiliary: No focal liver abnormality is seen. No gallstones, gallbladder wall thickening, or biliary dilatation. Pancreas: Unremarkable. No pancreatic ductal dilatation or surrounding inflammatory changes. Spleen: Normal in size without focal abnormality. Adrenals/Urinary Tract: Adrenal glands are unremarkable. Kidneys are normal, without renal calculi, focal lesion, or hydronephrosis. Bladder is unremarkable. Stomach/Bowel: The stomach and small bowel loops are normal in appearance. The appendix is well seen and has a normal appearance. Colon has a normal appearance. Moderate stool burden particularly within the ascending colon. Vascular/Lymphatic: No significant vascular findings are  present. No enlarged abdominal or pelvic lymph nodes. Reproductive: Uterus is present.  No adnexal mass. Other: No abdominal wall hernia or abnormality. No abdominopelvic ascites. Musculoskeletal: No acute or significant osseous findings. IMPRESSION: 1.  No evidence for acute  abnormality. 2. Mild subsegmental atelectasis at the right lung base. 3. Normal appendix. 4. Moderate stool burden. Electronically Signed   By: Nolon Nations [name]   On: 05/07/2016 17:54    ____________________________________________   PROCEDURES  Procedure(s) performed: None  Procedures  Critical Care performed: No ____________________________________________   INITIAL IMPRESSION / ASSESSMENT AND PLAN / ED COURSE  Pertinent labs & imaging results that were available during my care of the patient were reviewed by me and considered in my medical decision making (see chart for details).  41 y.o. female with 2.5 weeks of diffuse abdominal pain, including right upper quadrant pain with a burning sensation, and postprandial nausea and vomiting. I am concerned about the patient's gallbladder, and her clinical exam shows pain in the bilateral lower quadrants as well so we'll do CT scan as our first imaging tests. It is possible that she has gallbladder stones, will rule out cholecystitis, consider diverticulitis, and much less likely appendicitis. She does have a history of alcohol abuse, and GERD or peptic ulcer disease is also on the differential. If her workup in the emergency department is reassuring, I'll plan to send her for GI consultation and possible endoscopy, and start her on a PPI. Symptomatic treatment has been initiated.  ----------------------------------------- 6:46 PM on 05/07/2016 -----------------------------------------  The patient's workup in the emergency department has been reassuring. Her laboratory studies show normal white blood cell count, no leg slight disturbance, normal LFTs. She also has  normal lipase. Clinically, the patient has been doing well and is tolerating both liquids and she ate chips without any difficulty. Her CT scan does not show any gallbladder pathology, nor any other acute cause for her symptoms. It is possible that she has GERD or peptic or gastric ulcer disease, so I will start her on omeprazole and have her follow-up with her primary care physician in GI as needed. She will also be discharged with Zofran for nausea. I've given her instructions about a bland diet, and instructed her not to drink alcohol. Return precautions as well as follow-up instructions were discussed.  ____________________________________________  FINAL CLINICAL IMPRESSION(S) / ED DIAGNOSES  Final diagnoses:  Abdominal pain, generalized  Non-intractable vomiting with nausea, unspecified vomiting type         NEW MEDICATIONS STARTED DURING THIS VISIT:  New Prescriptions   OMEPRAZOLE (PRILOSEC) 40 MG CAPSULE    Take 1 capsule (40 mg total) by mouth daily.   ONDANSETRON (ZOFRAN ODT) 4 MG DISINTEGRATING TABLET    Take 1 tablet (4 mg total) by mouth every 8 (eight) hours as needed for nausea or vomiting.  Eula Listen, MD 05/07/16 [phone]

## 2016-05-07 NOTE — ED Notes (Signed)
Pt. Going home with friend 

## 2016-05-07 NOTE — ED Notes (Signed)
Pt. Finished constrast, ct informed.

## 2016-05-10 ENCOUNTER — Encounter: Payer: Self-pay | Admitting: Emergency Medicine

## 2016-05-10 ENCOUNTER — Emergency Department
Admission: EM | Admit: 2016-05-10 | Discharge: 2016-05-10 | Disposition: A | Discharge status: Home / Self Care | Payer: Medicaid Other | Attending: Emergency Medicine | Admitting: Emergency Medicine

## 2016-05-10 DIAGNOSIS — R109 Unspecified abdominal pain: Secondary | ICD-10-CM | POA: Insufficient documentation

## 2016-05-10 DIAGNOSIS — F17210 Nicotine dependence, cigarettes, uncomplicated: Secondary | ICD-10-CM | POA: Insufficient documentation

## 2016-05-10 DIAGNOSIS — J441 Chronic obstructive pulmonary disease with (acute) exacerbation: Secondary | ICD-10-CM | POA: Insufficient documentation

## 2016-05-10 DIAGNOSIS — E039 Hypothyroidism, unspecified: Secondary | ICD-10-CM | POA: Insufficient documentation

## 2016-05-10 DIAGNOSIS — J45909 Unspecified asthma, uncomplicated: Secondary | ICD-10-CM | POA: Insufficient documentation

## 2016-05-10 DIAGNOSIS — Z79899 Other long term (current) drug therapy: Secondary | ICD-10-CM | POA: Insufficient documentation

## 2016-05-10 DIAGNOSIS — R112 Nausea with vomiting, unspecified: Secondary | ICD-10-CM | POA: Insufficient documentation

## 2016-05-10 LAB — COMPREHENSIVE METABOLIC PANEL
ALK PHOS: 58 U/L (ref 38–126)
ALT: 16 U/L (ref 14–54)
ANION GAP: 6 (ref 5–15)
AST: 20 U/L (ref 15–41)
Albumin: 4.1 g/dL (ref 3.5–5.0)
BILIRUBIN TOTAL: 0.6 mg/dL (ref 0.3–1.2)
BUN: 7 mg/dL (ref 6–20)
CALCIUM: 9.3 mg/dL (ref 8.9–10.3)
CO2: 26 mmol/L (ref 22–32)
Chloride: 105 mmol/L (ref 101–111)
Creatinine, Ser: 0.74 mg/dL (ref 0.44–1.00)
GFR calc non Af Amer: >60 mL/min (ref >60)
Glucose, Bld: 101 mg/dL — ABNORMAL HIGH (ref 65–99)
Potassium: 3.9 mmol/L (ref 3.5–5.1)
Sodium: 137 mmol/L (ref 135–145)
TOTAL PROTEIN: 7.2 g/dL (ref 6.5–8.1)

## 2016-05-10 LAB — URINALYSIS, COMPLETE (UACMP) WITH MICROSCOPIC
Bilirubin Urine: NEGATIVE
GLUCOSE, UA: NEGATIVE mg/dL
KETONES UR: NEGATIVE mg/dL
LEUKOCYTES UA: NEGATIVE
NITRITE: NEGATIVE
PROTEIN: NEGATIVE mg/dL
RBC / HPF: NONE SEEN RBC/hpf (ref 0–5)
Specific Gravity, Urine: 1.001 — ABNORMAL LOW (ref 1.005–1.030)
pH: 8 (ref 5.0–8.0)

## 2016-05-10 LAB — CBC
HEMATOCRIT: 36.5 % (ref 35.0–47.0)
HEMOGLOBIN: 11.6 g/dL — AB (ref 12.0–16.0)
MCH: 24.3 pg — ABNORMAL LOW (ref 26.0–34.0)
MCHC: 31.7 g/dL — AB (ref 32.0–36.0)
MCV: 76.6 fL — ABNORMAL LOW (ref 80.0–100.0)
Platelets: 304 10*3/uL (ref 150–440)
RBC: 4.77 MIL/uL (ref 3.80–5.20)
RDW: 16.9 % — ABNORMAL HIGH (ref 11.5–14.5)
WBC: 6 10*3/uL (ref 3.6–11.0)

## 2016-05-10 LAB — LIPASE, BLOOD: Lipase: 13 U/L (ref 11–51)

## 2016-05-10 MED ORDER — SODIUM CHLORIDE 0.9 % IV BOLUS (SEPSIS)
1000.0000 mL | Freq: Once | INTRAVENOUS | Status: AC
Start: 2016-05-10 — End: 2016-05-10
  Administered 2016-05-10: 1000 mL via INTRAVENOUS

## 2016-05-10 MED ORDER — GI COCKTAIL ~~LOC~~
30.0000 mL | Freq: Once | ORAL | Status: AC
  Administered 2016-05-10: 30 mL via ORAL
  Filled 2016-05-10: qty 30

## 2016-05-10 MED ORDER — DICYCLOMINE HCL 20 MG PO TABS: 20.0000 mg | ORAL_TABLET | Freq: Three times a day (TID) | ORAL | 0 refills | Status: DC | PRN

## 2016-05-10 MED ORDER — SODIUM CHLORIDE 0.9 % IV BOLUS (SEPSIS)
1000.0000 mL | Freq: Once | INTRAVENOUS | Status: AC
  Administered 2016-05-10: 1000 mL via INTRAVENOUS

## 2016-05-10 MED ORDER — DICYCLOMINE HCL 10 MG PO CAPS
10.0000 mg | ORAL_CAPSULE | Freq: Once | ORAL | Status: AC
  Administered 2016-05-10: 10 mg via ORAL
  Filled 2016-05-10: qty 1

## 2016-05-10 MED ORDER — PREDNISONE 20 MG PO TABS
40.0000 mg | ORAL_TABLET | Freq: Every day | ORAL | 0 refills | Status: DC
Start: 2016-05-10 — End: 2016-08-23

## 2016-05-10 MED ORDER — PROMETHAZINE HCL 25 MG/ML IJ SOLN
12.5000 mg | Freq: Once | INTRAMUSCULAR | Status: AC
  Administered 2016-05-10: 12.5 mg via INTRAVENOUS
  Filled 2016-05-10: qty 1

## 2016-05-10 MED ORDER — IPRATROPIUM-ALBUTEROL 0.5-2.5 (3) MG/3ML IN SOLN
9.0000 mL | Freq: Once | RESPIRATORY_TRACT | Status: AC
  Administered 2016-05-10: 9 mL via RESPIRATORY_TRACT
  Filled 2016-05-10: qty 9

## 2016-05-10 MED ORDER — METHYLPREDNISOLONE SODIUM SUCC 125 MG IJ SOLR
125.0000 mg | Freq: Once | INTRAMUSCULAR | Status: AC
  Administered 2016-05-10: 125 mg via INTRAVENOUS
  Filled 2016-05-10: qty 2

## 2016-05-10 MED ORDER — SUCRALFATE 1 G PO TABS
1.0000 g | ORAL_TABLET | Freq: Four times a day (QID) | ORAL | 0 refills | Status: DC
Start: 2016-05-10 — End: 2016-12-06

## 2016-05-10 NOTE — ED Provider Notes (Signed)
[facility] Emergency Department Provider Note  ____________________________________________   First MD Initiated Contact with Patient 05/10/16 1502     (approximate)  I have reviewed the triage vital signs and the nursing notes.   HISTORY  Chief Complaint Emesis   HPI [name] is a 41 y.o. female with a history of alcohol abuse as well as tobacco abuse was present to the emergency department today with epigastric and right-sided abdominal pain with nausea and vomiting. The patient denies diarrhea. Says the pain has been ongoing over the past 4-5 days and she has already been seen in this emergency department for evaluation. Despite taking Zofran and omeprazole she is continuing to vomit. She says that she has vomited 4 times today without any blood in the vomitus. Says the pain feels like a cramping pain. Does not report any radiation of the pain. Says that she has now only drinking beers "every once a while." Says that she still smokes.   Past Medical History:  Diagnosis Date  . Alcohol abuse   . Anemia 01/31/2011  . Asthma   . COPD (chronic obstructive pulmonary disease) ([place])   . Depression   . Hypoxia 02/01/2011  . Needs sleep apnea assessment 2013.07.31  . Tobacco abuse     Patient Active Problem List   Diagnosis Date Noted  . Hypokalemia 02/10/2016  . Hypothyroidism 02/10/2016  . CAP (community acquired pneumonia) 01/19/2013  . Dehydration 01/19/2013  . Nausea vomiting and diarrhea 01/19/2013  . Alcohol dependence ([place]) 01/16/2012  . Alcohol withdrawal ([place]) 01/16/2012  . Hypoxia 02/01/2011  . Hyponatremia 02/01/2011  . Anemia 01/31/2011  . Chest pain, pleuritic 01/29/2011  . Back pain 01/29/2011  . HCAP (healthcare-associated pneumonia) 01/28/2011  . Acute bronchitis 01/08/2011  . COPD exacerbation ([place]) 01/08/2011  . Tobacco abuse 01/08/2011  . Polysubstance abuse 01/08/2011  . Major depression 01/08/2011    Past  Surgical History:  Procedure Laterality Date  . ANKLE GANGLION CYST EXCISION    . BACK SURGERY    . CESAREAN SECTION    . DILATION AND CURETTAGE OF UTERUS      Prior to Admission medications   Medication Sig Start Date End Date Taking? Authorizing Provider  albuterol (PROVENTIL HFA;VENTOLIN HFA) 108 (90 Base) MCG/ACT inhaler Inhale 1-2 puffs into the lungs every 6 (six) hours as needed for wheezing or shortness of breath.    Historical Provider, MD  albuterol (PROVENTIL) (2.5 MG/3ML) 0.083% nebulizer solution Take 3 mLs (2.5 mg total) by nebulization every 6 (six) hours as needed for wheezing or shortness of breath. For asthma 02/01/11   Rexene Alberts, MD  azithromycin (ZITHROMAX) 500 MG tablet Take 1 tablet (500 mg total) by mouth daily. 02/13/16   Epifanio Lesches, MD  budesonide-formoterol (SYMBICORT) 160-4.5 MCG/ACT inhaler Inhale 2 puffs into the lungs 2 (two) times daily. Patient not taking: Reported on 02/10/2016 11/12/14   Bettey Costa, MD  cyclobenzaprine (FLEXERIL) 10 MG tablet Take 1 tablet (10 mg total) by mouth 3 (three) times daily. Patient not taking: Reported on 02/10/2016 01/19/12   Ruben Im, PA-C  Fluticasone-Salmeterol (ADVAIR) 250-50 MCG/DOSE AEPB Inhale 1 puff into the lungs 2 (two) times daily.    Historical Provider, MD  levofloxacin (LEVAQUIN) 750 MG tablet Take 1 tablet (750 mg total) by mouth daily. 02/14/16   Epifanio Lesches, MD  levothyroxine (SYNTHROID, LEVOTHROID) 125 MCG tablet Take 125 mcg by mouth daily before breakfast.    Historical Provider, MD  montelukast (SINGULAIR) 10  MG tablet Take 10 mg by mouth at bedtime.    Historical Provider, MD  omeprazole (PRILOSEC) 40 MG capsule Take 1 capsule (40 mg total) by mouth daily. 05/07/16 05/07/17  Anne-Caroline Mariea Clonts, MD  ondansetron (ZOFRAN ODT) 4 MG disintegrating tablet Take 1 tablet (4 mg total) by mouth every 8 (eight) hours as needed for nausea or vomiting. 05/07/16   Eula Listen, MD  predniSONE  (STERAPRED UNI-PAK 21 TAB) 10 MG (21) TBPK tablet Take 1 tablet (10 mg total) by mouth daily. 02/13/16   Epifanio Lesches, MD    Allergies Patient has no known allergies.  Family History  Problem Relation Age of Onset  . Hypothyroidism Mother   . Coronary artery disease Mother     Social History Social History  Substance Use Topics  . Smoking status: Current Every Day Smoker    Packs/day: 0.50    Years: 20.00    Types: Cigarettes    Last attempt to quit: 06/19/2012  . Smokeless tobacco: Never Used  . Alcohol use 0.0 oz/week     Comment: occ    Review of Systems Constitutional: No fever/chills Eyes: No visual changes. ENT: No sore throat. Cardiovascular: Denies chest pain. Respiratory: Denies shortness of breath. Gastrointestinal:  No diarrhea.  No constipation. Genitourinary: Negative for dysuria. Musculoskeletal: Negative for back pain. Skin: Negative for rash. Neurological: Negative for headaches, focal weakness or numbness.  10-point ROS otherwise negative.  ____________________________________________   PHYSICAL EXAM:  VITAL SIGNS: ED Triage Vitals  Enc Vitals Group     BP 05/10/16 1227 116/67     Pulse Rate 05/10/16 1227 85     Resp 05/10/16 1227 16     Temp 05/10/16 1227 97.9 F (36.6 C)     Temp Source 05/10/16 1227 Oral     SpO2 05/10/16 1227 100 %     Weight 05/10/16 1227 218 lb (98.9 kg)     Height 05/10/16 1227 5\' 5"  (1.651 m)     Head Circumference --      Peak Flow --      Pain Score 05/10/16 1226 9     Pain Loc --      Pain Edu? --      Excl. in [place]? --     Constitutional: Alert and oriented. Well appearing and in no acute distress. Eyes: Conjunctivae are normal. PERRL. EOMI. Head: Atraumatic. Nose: No congestion/rhinnorhea. Mouth/Throat: Mucous membranes are moist.   Neck: No stridor.   Cardiovascular: Normal rate, regular rhythm. Grossly normal heart sounds.  Good peripheral circulation. Respiratory: Normal respiratory effort.  No  retractions.wheezing throughout Gastrointestinal: Soft with mild tenderness to the right middle as well as right upper and epigastric portions of the abdomen.  Negative Murphy sign. No tenderness over McBurney's point. No distention.  Musculoskeletal: No lower extremity tenderness nor edema.  No joint effusions. Neurologic:  Normal speech and language. No gross focal neurologic deficits are appreciated.  Skin:  Skin is warm, dry and intact. No rash noted. Psychiatric: Mood and affect are normal. Speech and behavior are normal.  ____________________________________________   LABS (all labs ordered are listed, but only abnormal results are displayed)  Labs Reviewed  COMPREHENSIVE METABOLIC PANEL - Abnormal; Notable for the following:       Result Value   Glucose, Bld 101 (*)    All other components within normal limits  CBC - Abnormal; Notable for the following:    Hemoglobin 11.6 (*)    MCV 76.6 (*)    [facility]  24.3 (*)    MCHC 31.7 (*)    RDW 16.9 (*)    All other components within normal limits  URINALYSIS, COMPLETE (UACMP) WITH MICROSCOPIC - Abnormal; Notable for the following:    Color, Urine STRAW (*)    APPearance CLEAR (*)    Specific Gravity, Urine 1.001 (*)    Hgb urine dipstick SMALL (*)    Bacteria, UA RARE (*)    Squamous Epithelial / LPF 0-5 (*)    All other components within normal limits  LIPASE, BLOOD  POC URINE PREG, ED   ____________________________________________  EKG   ____________________________________________  RADIOLOGY  CAT scan from several days ago without any acute disease. ____________________________________________   PROCEDURES  Procedure(s) performed:   Procedures  Critical Care performed:   ____________________________________________   INITIAL IMPRESSION / ASSESSMENT AND PLAN / ED COURSE  Pertinent labs & imaging results that were available during my care of the patient were reviewed by me and considered in my medical decision  making (see chart for details).  We will give albuterol as well as steroids as well as medication to help the patient with her wheezing. Plan to reassess.    ----------------------------------------- 5:10 PM on 05/10/2016 -----------------------------------------  Patient resting comfortable at this time without any distress. Says the pain is improved and she is now tolerating by mouth fluids. I will add Bentyl as well as sucralfate as needed. Her lungs are now only with scant wheezing. She says that her breathing feels normal and that even her baseline but usually has a slight wheeze. She'll be discharged to home. She will continue to follow as an outpatient. Abdomen reexamined and with only mild tenderness at this time to the right upper quadrant.  ____________________________________________   FINAL CLINICAL IMPRESSION(S) / ED DIAGNOSES  Abdominal pain with nausea vomiting. COPD exacerbation.    NEW MEDICATIONS STARTED DURING THIS VISIT:  New Prescriptions   No medications on file     Note:  This document was prepared using Dragon voice recognition software and may include unintentional dictation errors.    Orbie Pyo, MD 05/10/16 [phone]

## 2016-05-10 NOTE — ED Triage Notes (Addendum)
Pt to ED via POV with c/o nausea and vomiting x4days . Pt was seen here on 3/25 and given rx zofran and omeprazole, states no relief. Endo scheduled for end of month but unable to wait. Pt denies any fevers. VS stable.

## 2016-05-10 NOTE — ED Notes (Signed)
Dr. Schaevitz at bedside.  

## 2016-05-10 NOTE — ED Notes (Signed)
Pt states R sided belly pain since Saturday. Was seen Sunday for pain and vomiting. Had blood work and CT scan done, told to follow up with specialist, has appt on the 25th to look for ulcers but PCP told her to come back here because of pain and vomiting. Pt informed that she could have ulcers but that her gallbladder seems ok. Pt with continued vomiting- about 3 times a day. Denies fever, denies diarrhea. States no BM since Saturday because hasn't been able to keep anything down.

## 2016-06-06 ENCOUNTER — Ambulatory Visit (INDEPENDENT_AMBULATORY_CARE_PROVIDER_SITE_OTHER): Payer: Medicaid Other | Admitting: Gastroenterology

## 2016-06-06 ENCOUNTER — Telehealth: Payer: Self-pay | Admitting: Gastroenterology

## 2016-06-06 ENCOUNTER — Telehealth: Payer: Self-pay

## 2016-06-06 ENCOUNTER — Other Ambulatory Visit: Payer: Self-pay

## 2016-06-06 ENCOUNTER — Encounter: Payer: Self-pay | Admitting: Gastroenterology

## 2016-06-06 VITALS — BP 110/76 | HR 88 | Temp 98.3°F | Ht 65.0 in | Wt 203.0 lb

## 2016-06-06 DIAGNOSIS — R1011 Right upper quadrant pain: Secondary | ICD-10-CM

## 2016-06-06 DIAGNOSIS — R1031 Right lower quadrant pain: Secondary | ICD-10-CM

## 2016-06-06 DIAGNOSIS — N921 Excessive and frequent menstruation with irregular cycle: Secondary | ICD-10-CM

## 2016-06-06 DIAGNOSIS — K5900 Constipation, unspecified: Secondary | ICD-10-CM

## 2016-06-06 DIAGNOSIS — D509 Iron deficiency anemia, unspecified: Secondary | ICD-10-CM

## 2016-06-06 DIAGNOSIS — Z8742 Personal history of other diseases of the female genital tract: Secondary | ICD-10-CM

## 2016-06-06 DIAGNOSIS — F17200 Nicotine dependence, unspecified, uncomplicated: Secondary | ICD-10-CM

## 2016-06-06 MED ORDER — POLYETHYLENE GLYCOL 3350 17 GM/SCOOP PO POWD: 1.0000 | Freq: Every day | ORAL | 3 refills | Status: DC

## 2016-06-06 MED ORDER — DICYCLOMINE HCL 20 MG PO TABS: 20.0000 mg | ORAL_TABLET | Freq: Three times a day (TID) | ORAL | 0 refills | Status: DC | PRN

## 2016-06-06 NOTE — Telephone Encounter (Signed)
Advised pt HIDA scan on 5/10 @1130am .   NPO 6hrs prior No pain killers 6 hours prior.

## 2016-06-06 NOTE — Progress Notes (Signed)
Gastroenterology Consultation  Referring Provider:     Nat Christen, PA-C Primary Care Physician:  Nat Christen, PA-C Primary Gastroenterologist:  Dr. Jonathon Bellows  Reason for Consultation:     Abdominal pain.         HPI:   [name] is a 41 y.o. y/o female referred for consultation & management  by Dr. Nat Christen, PA-C.  She has a history of alcohol abuse. She presented to the ER in march 2018 with abdominal pain , nausea and vomiting . Labs showed a microcytic anemia. Hb 11.3,MCV 76.CT abdomen and pelvis in 04/2016 Nil acute , moderate stool burden.   Abdominal pain: Onset: Began 6 weeks back, not improved, vomiting is better. Comes and goes, has it more time than not. Occurs every day , each episode lasts hours  Site :RLQ, also feels a knot in the upper part  Radiation: localized Severity :8/10  Nature of pain: sharp in nature  Aggravating factors: eating , pain occurs after an hour, she throws up  Relieving factors :heating pad Weight loss: 15 lbs  NSAID use: Initially she was on motrin - quit taking a month back  PPI use :Omeprazole- does not help, she throws up once a day , usually at night at around 8 pm  Gall bladder surgery: intact  Frequency of bowel movements: every few days, sometimes once a week and very hard at times,  Change in bowel movements: no  Relief with bowel movements: no, feels better after throwing up  Gas/Bloating/Abdominal distension: no   She has heavy periods, changes a pad every two hours , not seen anyone for it.  Alcohol , been around people who smoke marijuana.  Past Medical History:  Diagnosis Date  . Alcohol abuse   . Anemia 01/31/2011  . Asthma   . COPD (chronic obstructive pulmonary disease) ([place])   . Depression   . Hypoxia 02/01/2011  . Needs sleep apnea assessment 2013.07.31  . Tobacco abuse     Past Surgical History:  Procedure Laterality Date  . ANKLE GANGLION CYST EXCISION    . BACK SURGERY    . CESAREAN SECTION     . DILATION AND CURETTAGE OF UTERUS      Prior to Admission medications   Medication Sig Start Date End Date Taking? Authorizing Provider  albuterol (PROVENTIL HFA;VENTOLIN HFA) 108 (90 Base) MCG/ACT inhaler Inhale 1-2 puffs into the lungs every 6 (six) hours as needed for wheezing or shortness of breath.   Yes Historical Provider, MD  dicyclomine (BENTYL) 20 MG tablet Take 1 tablet (20 mg total) by mouth 3 (three) times daily as needed for spasms. 05/10/16 05/10/17 Yes Orbie Pyo, MD  Fluticasone-Salmeterol (ADVAIR) 250-50 MCG/DOSE AEPB Inhale 1 puff into the lungs 2 (two) times daily.   Yes Historical Provider, MD  levothyroxine (SYNTHROID, LEVOTHROID) 125 MCG tablet Take 125 mcg by mouth daily before breakfast.   Yes Historical Provider, MD  omeprazole (PRILOSEC) 40 MG capsule Take 1 capsule (40 mg total) by mouth daily. 05/07/16 05/07/17 Yes Anne-Caroline Mariea Clonts, MD  ondansetron (ZOFRAN ODT) 4 MG disintegrating tablet Take 1 tablet (4 mg total) by mouth every 8 (eight) hours as needed for nausea or vomiting. 05/07/16  Yes Anne-Caroline Mariea Clonts, MD  sucralfate (CARAFATE) 1 g tablet Take 1 tablet (1 g total) by mouth 4 (four) times daily. 05/10/16 05/10/17 Yes Orbie Pyo, MD  albuterol (PROVENTIL) (2.5 MG/3ML) 0.083% nebulizer solution Take 3 mLs (2.5 mg total) by nebulization every 6 (  six) hours as needed for wheezing or shortness of breath. For asthma Patient not taking: Reported on 06/06/2016 02/01/11   Rexene Alberts, MD  azithromycin (ZITHROMAX) 500 MG tablet Take 1 tablet (500 mg total) by mouth daily. Patient not taking: Reported on 06/06/2016 02/13/16   Epifanio Lesches, MD  levofloxacin (LEVAQUIN) 750 MG tablet Take 1 tablet (750 mg total) by mouth daily. Patient not taking: Reported on 06/06/2016 02/14/16   Epifanio Lesches, MD  montelukast (SINGULAIR) 10 MG tablet Take 10 mg by mouth at bedtime.    Historical Provider, MD  predniSONE (DELTASONE) 20 MG tablet Take 2  tablets (40 mg total) by mouth daily. Patient not taking: Reported on 06/06/2016 05/10/16 05/10/17  Orbie Pyo, MD    Family History  Problem Relation Age of Onset  . Hypothyroidism Mother   . Coronary artery disease Mother   . Throat cancer Mother   . Diabetes Maternal Grandmother      Social History  Substance Use Topics  . Smoking status: Current Every Day Smoker    Packs/day: 0.50    Years: 20.00    Types: Cigarettes    Last attempt to quit: 06/19/2012  . Smokeless tobacco: Never Used  . Alcohol use 0.0 oz/week     Comment: occ    Allergies as of 06/06/2016  . (No Known Allergies)    Review of Systems:    All systems reviewed and negative except where noted in HPI.   Physical Exam:  BP 110/76 (BP Location: Right Arm, Patient Position: Sitting, Cuff Size: Normal)   Pulse 88   Temp 98.3 F (36.8 C) (Oral)   Ht 5\' 5"  (1.651 m)   Wt 203 lb (92.1 kg)   BMI 33.78 kg/m  No LMP recorded. Psych:  Alert and cooperative. Normal mood and affect. General:   Alert,  Well-developed, well-nourished, pleasant and cooperative in NAD Head:  Normocephalic and atraumatic. Eyes:  Sclera clear, no icterus.   Conjunctiva pink. Ears:  Normal auditory acuity. Nose:  No deformity, discharge, or lesions. Mouth:  No deformity or lesions,oropharynx pink & moist. Neck:  Supple; no masses or thyromegaly. Lungs:  Respirations even and unlabored.  Clear throughout to auscultation.   No wheezes, crackles, or rhonchi. No acute distress. Heart:  Regular rate and rhythm; no murmurs, clicks, rubs, or gallops. Abdomen:  Normal bowel sounds.  No bruits.  Soft, non-tender and non-distended without masses, hepatosplenomegaly or hernias noted.  No guarding or rebound tenderness.    Msk:  Symmetrical without gross deformities. Good, equal movement & strength bilaterally. Pulses:  Normal pulses noted. Extremities:  No clubbing or edema.  No cyanosis. Neurologic:  Alert and oriented x3;  grossly  normal neurologically. Skin:  Intact without significant lesions or rashes. No jaundice. Lymph Nodes:  No significant cervical adenopathy. Psych:  Alert and cooperative. Normal mood and affect.  Imaging Studies: Ct Abdomen Pelvis W Contrast  Result Date: 05/07/2016 CLINICAL DATA:  Pt with abd/pelvic pain that started 2 weeks ago. Pt states N/V after eating that has been going on the same time. 121ml of Isovue 300 used.^128mL ISOVUE-300 IOPAMIDOL (ISOVUE-300) INJECTION 61% EXAM: CT ABDOMEN AND PELVIS WITH CONTRAST TECHNIQUE: Multidetector CT imaging of the abdomen and pelvis was performed using the standard protocol following bolus administration of intravenous contrast. CONTRAST:  14mL ISOVUE-300 IOPAMIDOL (ISOVUE-300) INJECTION 61% COMPARISON:  01/28/2012 FINDINGS: Lower chest: There is subsegmental atelectasis at the right lung base. Heart size is normal. Hepatobiliary: No focal liver abnormality is seen.  No gallstones, gallbladder wall thickening, or biliary dilatation. Pancreas: Unremarkable. No pancreatic ductal dilatation or surrounding inflammatory changes. Spleen: Normal in size without focal abnormality. Adrenals/Urinary Tract: Adrenal glands are unremarkable. Kidneys are normal, without renal calculi, focal lesion, or hydronephrosis. Bladder is unremarkable. Stomach/Bowel: The stomach and small bowel loops are normal in appearance. The appendix is well seen and has a normal appearance. Colon has a normal appearance. Moderate stool burden particularly within the ascending colon. Vascular/Lymphatic: No significant vascular findings are present. No enlarged abdominal or pelvic lymph nodes. Reproductive: Uterus is present.  No adnexal mass. Other: No abdominal wall hernia or abnormality. No abdominopelvic ascites. Musculoskeletal: No acute or significant osseous findings. IMPRESSION: 1.  No evidence for acute  abnormality. 2. Mild subsegmental atelectasis at the right lung base. 3. Normal appendix. 4.  Moderate stool burden. Electronically Signed   By: Nolon Nations [name]   On: 05/07/2016 17:54    Assessment and Plan:   [name] is a 41 y.o. y/o female has been referred for abdominal pain, nausea,vomiting, I also noted microcytic anemia likely secondary to menorrhagia. Her abdominal pain is likely from a combination of constipation, nausea from smoking and marijuana exposure and possible reflux. I will rule out any peptic ulcers and perform the below tests.    1. RUQ USG and HIDA 2. Stool for H pylori  3. Iron studies for microcytic anemia, celiac serology  4. GYN referral for menorrhagia.  5. Stop smoking  6. Urine drug screen  7. Stop all exposure to [place] 8. Miralax for constipation  10. EGD + colonoscopy    Follow up in 8-10 weeks  Dr Jonathon Bellows MD

## 2016-06-06 NOTE — Telephone Encounter (Signed)
Gastroenterology Pre-Procedure Review  Request Date: 5/15 Requesting Physician: Dr. Vicente Males  PATIENT REVIEW QUESTIONS: The patient responded to the following health history questions as indicated:    1. Are you having any GI issues? yes (Abd Pain) 2. Do you have a personal history of Polyps? no 3. Do you have a family history of Colon Cancer or Polyps? no 4. Diabetes Mellitus? no 5. Joint replacements in the past 12 months?no 6. Major health problems in the past 3 months?no 7. Any artificial heart valves, MVP, or defibrillator?no    MEDICATIONS & ALLERGIES:    Patient reports the following regarding taking any anticoagulation/antiplatelet therapy:   Plavix, Coumadin, Eliquis, Xarelto, Lovenox, Pradaxa, Brilinta, or Effient? no Aspirin? no  Patient confirms/reports the following medications:  Current Outpatient Prescriptions  Medication Sig Dispense Refill  . albuterol (PROVENTIL HFA;VENTOLIN HFA) 108 (90 Base) MCG/ACT inhaler Inhale 1-2 puffs into the lungs every 6 (six) hours as needed for wheezing or shortness of breath.    Marland Kitchen albuterol (PROVENTIL) (2.5 MG/3ML) 0.083% nebulizer solution Take 3 mLs (2.5 mg total) by nebulization every 6 (six) hours as needed for wheezing or shortness of breath. For asthma (Patient not taking: Reported on 06/06/2016) 75 mL 1  . azithromycin (ZITHROMAX) 500 MG tablet Take 1 tablet (500 mg total) by mouth daily. (Patient not taking: Reported on 06/06/2016) 3 tablet 0  . dicyclomine (BENTYL) 20 MG tablet Take 1 tablet (20 mg total) by mouth 3 (three) times daily as needed for spasms. 30 tablet 0  . Fluticasone-Salmeterol (ADVAIR) 250-50 MCG/DOSE AEPB Inhale 1 puff into the lungs 2 (two) times daily.    Marland Kitchen levofloxacin (LEVAQUIN) 750 MG tablet Take 1 tablet (750 mg total) by mouth daily. (Patient not taking: Reported on 06/06/2016) 7 tablet 0  . levothyroxine (SYNTHROID, LEVOTHROID) 125 MCG tablet Take 125 mcg by mouth daily before breakfast.    . montelukast  (SINGULAIR) 10 MG tablet Take 10 mg by mouth at bedtime.    Marland Kitchen omeprazole (PRILOSEC) 40 MG capsule Take 1 capsule (40 mg total) by mouth daily. 30 capsule 0  . ondansetron (ZOFRAN ODT) 4 MG disintegrating tablet Take 1 tablet (4 mg total) by mouth every 8 (eight) hours as needed for nausea or vomiting. 20 tablet 0  . polyethylene glycol powder (GLYCOLAX/MIRALAX) powder Take 255 g by mouth daily. 255 g 3  . predniSONE (DELTASONE) 20 MG tablet Take 2 tablets (40 mg total) by mouth daily. (Patient not taking: Reported on 06/06/2016) 8 tablet 0  . sucralfate (CARAFATE) 1 g tablet Take 1 tablet (1 g total) by mouth 4 (four) times daily. 30 tablet 0   No current facility-administered medications for this visit.     Patient confirms/reports the following allergies:  No Known Allergies  No orders of the defined types were placed in this encounter.   AUTHORIZATION INFORMATION Primary Insurance: 1D#: Group #:  Secondary Insurance: 1D#: Group #:  SCHEDULE INFORMATION: Date: 5/15 Time: Location: [facility]

## 2016-06-06 NOTE — Telephone Encounter (Signed)
Mark at [employer] [phone] LVM. He needs clarification on Miralax powder.

## 2016-06-06 NOTE — Telephone Encounter (Signed)
06/06/16 Patient is self pay. She has no insurance.

## 2016-06-13 ENCOUNTER — Telehealth: Payer: Self-pay

## 2016-06-13 ENCOUNTER — Other Ambulatory Visit: Payer: Self-pay

## 2016-06-13 NOTE — Telephone Encounter (Signed)
Pt was confused concerning Suprep & polyethylene glycol powder.   Advised patient, per Dr. Allen Norris, to use Glycolax as colonoscopy prep instead of Suprep. Pt is to purchase 64 oz of Gatorade and mix with powder evening prior to procedure.   Dr. Allen Norris reviewed patient's chart while she was in the office due to acute abd pain and recent ER visit to [facility].   Patient was given Trulance samples to help with constipation and abd pain until 5/15 procedures.

## 2016-06-20 ENCOUNTER — Other Ambulatory Visit: Payer: Self-pay

## 2016-06-20 DIAGNOSIS — Z01812 Encounter for preprocedural laboratory examination: Secondary | ICD-10-CM

## 2016-06-27 ENCOUNTER — Telehealth: Payer: Self-pay | Admitting: Gastroenterology

## 2016-06-27 ENCOUNTER — Ambulatory Visit: Payer: 59 | Admitting: Anesthesiology

## 2016-06-27 ENCOUNTER — Ambulatory Visit
Admission: RE | Admit: 2016-06-27 | Discharge: 2016-06-27 | Disposition: A | Discharge status: Home / Self Care | Payer: 59 | Source: Ambulatory Visit | Attending: Gastroenterology | Admitting: Gastroenterology

## 2016-06-27 ENCOUNTER — Encounter: Payer: Self-pay | Admitting: *Deleted

## 2016-06-27 ENCOUNTER — Encounter
Admission: RE | Disposition: A | Discharge status: Home / Self Care | Payer: Self-pay | Source: Ambulatory Visit | Attending: Gastroenterology

## 2016-06-27 DIAGNOSIS — R109 Unspecified abdominal pain: Secondary | ICD-10-CM | POA: Diagnosis present

## 2016-06-27 DIAGNOSIS — Z7951 Long term (current) use of inhaled steroids: Secondary | ICD-10-CM | POA: Diagnosis not present

## 2016-06-27 DIAGNOSIS — K2950 Unspecified chronic gastritis without bleeding: Secondary | ICD-10-CM | POA: Insufficient documentation

## 2016-06-27 DIAGNOSIS — Z833 Family history of diabetes mellitus: Secondary | ICD-10-CM | POA: Diagnosis not present

## 2016-06-27 DIAGNOSIS — D509 Iron deficiency anemia, unspecified: Secondary | ICD-10-CM | POA: Diagnosis not present

## 2016-06-27 DIAGNOSIS — J449 Chronic obstructive pulmonary disease, unspecified: Secondary | ICD-10-CM | POA: Diagnosis not present

## 2016-06-27 DIAGNOSIS — Z800 Family history of malignant neoplasm of digestive organs: Secondary | ICD-10-CM | POA: Insufficient documentation

## 2016-06-27 DIAGNOSIS — F329 Major depressive disorder, single episode, unspecified: Secondary | ICD-10-CM | POA: Diagnosis not present

## 2016-06-27 DIAGNOSIS — K648 Other hemorrhoids: Secondary | ICD-10-CM | POA: Insufficient documentation

## 2016-06-27 DIAGNOSIS — F17210 Nicotine dependence, cigarettes, uncomplicated: Secondary | ICD-10-CM | POA: Insufficient documentation

## 2016-06-27 DIAGNOSIS — D123 Benign neoplasm of transverse colon: Secondary | ICD-10-CM | POA: Diagnosis not present

## 2016-06-27 DIAGNOSIS — R1011 Right upper quadrant pain: Secondary | ICD-10-CM | POA: Diagnosis not present

## 2016-06-27 DIAGNOSIS — Z9889 Other specified postprocedural states: Secondary | ICD-10-CM | POA: Diagnosis not present

## 2016-06-27 DIAGNOSIS — Z8249 Family history of ischemic heart disease and other diseases of the circulatory system: Secondary | ICD-10-CM | POA: Diagnosis not present

## 2016-06-27 DIAGNOSIS — Z79899 Other long term (current) drug therapy: Secondary | ICD-10-CM | POA: Insufficient documentation

## 2016-06-27 DIAGNOSIS — K640 First degree hemorrhoids: Secondary | ICD-10-CM | POA: Diagnosis not present

## 2016-06-27 DIAGNOSIS — Z8349 Family history of other endocrine, nutritional and metabolic diseases: Secondary | ICD-10-CM | POA: Insufficient documentation

## 2016-06-27 DIAGNOSIS — K30 Functional dyspepsia: Secondary | ICD-10-CM | POA: Insufficient documentation

## 2016-06-27 HISTORY — PX: COLONOSCOPY WITH PROPOFOL: SHX5780

## 2016-06-27 HISTORY — PX: ESOPHAGOGASTRODUODENOSCOPY (EGD) WITH PROPOFOL: SHX5813

## 2016-06-27 LAB — URINE DRUG SCREEN, QUALITATIVE (ARMC ONLY)
Amphetamines, Ur Screen: NOT DETECTED
BARBITURATES, UR SCREEN: NOT DETECTED
BENZODIAZEPINE, UR SCRN: NOT DETECTED
Cannabinoid 50 Ng, Ur ~~LOC~~: NOT DETECTED
Cocaine Metabolite,Ur ~~LOC~~: NOT DETECTED
MDMA (Ecstasy)Ur Screen: NOT DETECTED
Methadone Scn, Ur: NOT DETECTED
OPIATE, UR SCREEN: NOT DETECTED
PHENCYCLIDINE (PCP) UR S: NOT DETECTED
Tricyclic, Ur Screen: NOT DETECTED

## 2016-06-27 LAB — POCT PREGNANCY, URINE: Preg Test, Ur: NEGATIVE

## 2016-06-27 SURGERY — COLONOSCOPY WITH PROPOFOL
Anesthesia: General

## 2016-06-27 MED ORDER — MIDAZOLAM HCL 2 MG/2ML IJ SOLN
INTRAMUSCULAR | Status: AC
  Filled 2016-06-27: qty 2

## 2016-06-27 MED ORDER — PROPOFOL 500 MG/50ML IV EMUL
INTRAVENOUS | Status: AC
  Filled 2016-06-27: qty 50

## 2016-06-27 MED ORDER — FENTANYL CITRATE (PF) 100 MCG/2ML IJ SOLN
INTRAMUSCULAR | Status: DC | PRN
  Administered 2016-06-27 (×2): 50 ug via INTRAVENOUS

## 2016-06-27 MED ORDER — MIDAZOLAM HCL 2 MG/2ML IJ SOLN
INTRAMUSCULAR | Status: DC | PRN
  Administered 2016-06-27: 2 mg via INTRAVENOUS

## 2016-06-27 MED ORDER — PROPOFOL 10 MG/ML IV BOLUS
INTRAVENOUS | Status: DC | PRN
  Administered 2016-06-27: 100 mg via INTRAVENOUS
  Administered 2016-06-27 (×4): 20 mg via INTRAVENOUS

## 2016-06-27 MED ORDER — ONDANSETRON HCL 4 MG/2ML IJ SOLN
INTRAMUSCULAR | Status: DC | PRN
  Administered 2016-06-27: 4 mg via INTRAVENOUS

## 2016-06-27 MED ORDER — FENTANYL CITRATE (PF) 100 MCG/2ML IJ SOLN
INTRAMUSCULAR | Status: AC
  Filled 2016-06-27: qty 2

## 2016-06-27 MED ORDER — PHENYLEPHRINE HCL 10 MG/ML IJ SOLN
INTRAMUSCULAR | Status: DC | PRN
  Administered 2016-06-27 (×3): 100 ug via INTRAVENOUS

## 2016-06-27 MED ORDER — SODIUM CHLORIDE 0.9 % IV SOLN
INTRAVENOUS | Status: DC
  Administered 2016-06-27: 11:00:00 via INTRAVENOUS

## 2016-06-27 MED ORDER — ONDANSETRON HCL 4 MG/2ML IJ SOLN
INTRAMUSCULAR | Status: AC
  Filled 2016-06-27: qty 2

## 2016-06-27 NOTE — Anesthesia Post-op Follow-up Note (Cosign Needed)
Anesthesia QCDR form completed.        

## 2016-06-27 NOTE — Op Note (Signed)
[facility] Gastroenterology Patient Name: [name] Procedure Date: 06/27/2016 12:22 PM MRN: [id number] Account #: [id number] Date of Birth: [birth year] Admit Type: Ambulatory Age: 41 Room: [facility] ENDO ROOM 1 Gender: Female Note Status: Finalized Procedure:            Colonoscopy Indications:          Abdominal pain Providers:            Jonathon Bellows MD, MD Medicines:            Monitored Anesthesia Care Complications:        No immediate complications. Procedure:            Pre-Anesthesia Assessment:                       - Prior to the procedure, a History and Physical was                        performed, and patient medications, allergies and                        sensitivities were reviewed. The patient's tolerance of                        previous anesthesia was reviewed.                       - The risks and benefits of the procedure and the                        sedation options and risks were discussed with the                        patient. All questions were answered and informed                        consent was obtained.                       - ASA Grade Assessment: III - A patient with severe                        systemic disease.                       After obtaining informed consent, the colonoscope was                        passed under direct vision. Throughout the procedure,                        the patient's blood pressure, pulse, and oxygen                        saturations were monitored continuously. The                        Colonoscope was introduced through the anus and                        advanced to the the cecum, identified by the  appendiceal orifice, IC valve and transillumination.                        The colonoscopy was performed with moderate difficulty                        due to a tortuous colon. The patient tolerated the                        procedure well. The quality of the  bowel preparation                        was fair. Findings:      A 3 mm polyp was found in the transverse colon. The polyp was sessile.       The polyp was removed with a cold biopsy forceps. Resection and       retrieval were complete.      Non-bleeding internal hemorrhoids were found during retroflexion. The       hemorrhoids were small and Grade I (internal hemorrhoids that do not       prolapse).      The exam was otherwise without abnormality on direct and retroflexion       views. Impression:           - Preparation of the colon was fair.                       - One 3 mm polyp in the transverse colon, removed with                        a cold biopsy forceps. Resected and retrieved.                       - Non-bleeding internal hemorrhoids.                       - The examination was otherwise normal on direct and                        retroflexion views. Recommendation:       - Discharge patient to home (with escort).                       - Resume previous diet.                       - Continue present medications.                       - Await pathology results.                       - Repeat colonoscopy for surveillance based on                        pathology results.                       - Return to my office in 4 weeks. Procedure Code(s):    --- Professional ---                       [phone],  Colonoscopy, flexible; with biopsy, single or                        multiple Diagnosis Code(s):    --- Professional ---                       K64.0, First degree hemorrhoids                       D12.3, Benign neoplasm of transverse colon (hepatic                        flexure or splenic flexure)                       R10.9, Unspecified abdominal pain CPT copyright 2016 American Medical Association. All rights reserved. The codes documented in this report are preliminary and upon coder review may  be revised to meet current compliance requirements. Jonathon Bellows, MD Jonathon Bellows  MD, MD 06/27/2016 12:48:08 PM This report has been signed electronically. Number of Addenda: 0 Note Initiated On: 06/27/2016 12:22 PM Scope Withdrawal Time: 0 hours 8 minutes 40 seconds  Total Procedure Duration: 0 hours 21 minutes 38 seconds       [facility]

## 2016-06-27 NOTE — Transfer of Care (Signed)
Immediate Anesthesia Transfer of Care Note  Patient: [name]  Procedure(s) Performed: Procedure(s): COLONOSCOPY WITH PROPOFOL (N/A) ESOPHAGOGASTRODUODENOSCOPY (EGD) WITH PROPOFOL (N/A)  Patient Location: PACU  Anesthesia Type:General  Level of Consciousness: awake, alert  and oriented  Airway & Oxygen Therapy: Patient Spontanous Breathing and Patient connected to nasal cannula oxygen  Post-op Assessment: Report given to RN and Post -op Vital signs reviewed and stable  Post vital signs: Reviewed and stable  Last Vitals:  Vitals:   06/27/16 1051  BP: 122/71  Pulse: 74  Resp: 16  Temp: (!) 35.9 C    Last Pain:  Vitals:   06/27/16 1051  TempSrc: Tympanic         Complications: No apparent anesthesia complications and Patient re-intubated

## 2016-06-27 NOTE — Telephone Encounter (Signed)
06/27/16 9:10 am  Spoke with Ronalee Belts at [facility] for EGD & Colonoscopy 24818 & 45332, N92.1 & R10.11 Auth #: [id number].

## 2016-06-27 NOTE — Anesthesia Preprocedure Evaluation (Signed)
Anesthesia Evaluation  Patient identified by MRN, date of birth, ID band Patient awake    Reviewed: Allergy & Precautions, H&P , NPO status , Patient's Chart, lab work & pertinent test results, reviewed documented beta blocker date and time   Airway Mallampati: II   Neck ROM: full    Dental  (+) Poor Dentition   Pulmonary neg pulmonary ROS, asthma , pneumonia, COPD, Current Smoker,    Pulmonary exam normal        Cardiovascular negative cardio ROS Normal cardiovascular exam Rhythm:regular Rate:Normal     Neuro/Psych PSYCHIATRIC DISORDERS negative neurological ROS  negative psych ROS   GI/Hepatic negative GI ROS, Neg liver ROS,   Endo/Other  negative endocrine ROSHypothyroidism   Renal/GU negative Renal ROS  negative genitourinary   Musculoskeletal   Abdominal   Peds  Hematology negative hematology ROS (+) anemia ,   Anesthesia Other Findings Past Medical History: No date: Alcohol abuse 01/31/2011: Anemia No date: Asthma No date: COPD (chronic obstructive pulmonary disease) (* No date: Depression 02/01/2011: Hypoxia 2013.07.31: Needs sleep apnea assessment No date: Tobacco abuse Past Surgical History: No date: ANKLE GANGLION CYST EXCISION No date: BACK SURGERY No date: CESAREAN SECTION No date: DILATION AND CURETTAGE OF UTERUS   Reproductive/Obstetrics negative OB ROS                             Anesthesia Physical Anesthesia Plan  ASA: III  Anesthesia Plan: General   Post-op Pain Management:    Induction:   Airway Management Planned:   Additional Equipment:   Intra-op Plan:   Post-operative Plan:   Informed Consent: I have reviewed the patients History and Physical, chart, labs and discussed the procedure including the risks, benefits and alternatives for the proposed anesthesia with the patient or authorized representative who has indicated his/her understanding and  acceptance.   Dental Advisory Given  Plan Discussed with: CRNA  Anesthesia Plan Comments:         Anesthesia Quick Evaluation

## 2016-06-27 NOTE — H&P (Signed)
Kari Bellows MD [address], [place], [place] [address] Phone: [phone] Fax : [phone]  Primary Care Physician:  Kari Christen, PA-C Primary Gastroenterologist:  Dr. Jonathon Matthews   Pre-Procedure History & Physical: HPI:  [name] is a 41 y.o. female is here for an endoscopy and colonoscopy.   Past Medical History:  Diagnosis Date  . Alcohol abuse   . Anemia 01/31/2011  . Asthma   . COPD (chronic obstructive pulmonary disease) ([place])   . Depression   . Hypoxia 02/01/2011  . Needs sleep apnea assessment 2013.07.31  . Tobacco abuse     Past Surgical History:  Procedure Laterality Date  . ANKLE GANGLION CYST EXCISION    . BACK SURGERY    . CESAREAN SECTION    . DILATION AND CURETTAGE OF UTERUS      Prior to Admission medications   Medication Sig Start Date End Date Taking? Authorizing Provider  albuterol (PROVENTIL HFA;VENTOLIN HFA) 108 (90 Base) MCG/ACT inhaler Inhale 1-2 puffs into the lungs every 6 (six) hours as needed for wheezing or shortness of breath.   Yes [provider]  dicyclomine (BENTYL) 20 MG tablet Take 1 tablet (20 mg total) by mouth 3 (three) times daily as needed for spasms. 06/06/16 06/06/17 Yes Kari Bellows, MD  Fluticasone-Salmeterol (ADVAIR) 250-50 MCG/DOSE AEPB Inhale 1 puff into the lungs 2 (two) times daily.   Yes [provider]  levothyroxine (SYNTHROID, LEVOTHROID) 125 MCG tablet Take 125 mcg by mouth daily before breakfast.   Yes [provider]  montelukast (SINGULAIR) 10 MG tablet Take 10 mg by mouth at bedtime.   Yes [provider]  polyethylene glycol powder (GLYCOLAX/MIRALAX) powder Take 255 g by mouth daily. 06/06/16  Yes Kari Bellows, MD  albuterol (PROVENTIL) (2.5 MG/3ML) 0.083% nebulizer solution Take 3 mLs (2.5 mg total) by nebulization every 6 (six) hours as needed for wheezing or shortness of breath. For asthma Patient not taking: Reported on 06/06/2016 02/01/11   Kari Alberts,  MD  azithromycin (ZITHROMAX) 500 MG tablet Take 1 tablet (500 mg total) by mouth daily. Patient not taking: Reported on 06/06/2016 02/13/16   Kari Lesches, MD  levofloxacin (LEVAQUIN) 750 MG tablet Take 1 tablet (750 mg total) by mouth daily. Patient not taking: Reported on 06/06/2016 02/14/16   Kari Lesches, MD  omeprazole (PRILOSEC) 40 MG capsule Take 1 capsule (40 mg total) by mouth daily. Patient not taking: Reported on 06/27/2016 05/07/16 05/07/17  Kari Listen, MD  ondansetron (ZOFRAN ODT) 4 MG disintegrating tablet Take 1 tablet (4 mg total) by mouth every 8 (eight) hours as needed for nausea or vomiting. 05/07/16   Kari Listen, MD  predniSONE (DELTASONE) 20 MG tablet Take 2 tablets (40 mg total) by mouth daily. Patient not taking: Reported on 06/06/2016 05/10/16 05/10/17  Kari Pyo, MD  sucralfate (CARAFATE) 1 g tablet Take 1 tablet (1 g total) by mouth 4 (four) times daily. Patient not taking: Reported on 06/27/2016 05/10/16 05/10/17  Kari Pyo, MD    Allergies as of 06/06/2016  . (No Known Allergies)    Family History  Problem Relation Age of Onset  . Hypothyroidism Mother   . Coronary artery disease Mother   . Throat cancer Mother   . Diabetes Maternal Grandmother     Social History   Social History  . Marital status: Single    Spouse name: N/A  . Number of children: N/A  . Years of education: N/A   Occupational History  .  Not on file.   Social History Main Topics  . Smoking status: Current Every Day Smoker    Packs/day: 1.00    Years: 20.00    Types: Cigarettes    Last attempt to quit: 06/19/2012  . Smokeless tobacco: Never Used  . Alcohol use 0.0 oz/week     Comment: occ  . Drug use: No     Comment: crack-Not since September  . Sexual activity: No     Comment: Homosexual   Other Topics Concern  . Not on file   Social History Narrative  . No narrative on file    Review of Systems: See HPI,  otherwise negative ROS  Physical Exam: BP 122/71   Pulse 74   Temp (!) 96.7 F (35.9 C) (Tympanic)   Resp 16   Ht 5\' 5"  (1.651 m)   Wt 204 lb (92.5 kg)   SpO2 100%   BMI 33.95 kg/m  General:   Alert,  pleasant and cooperative in NAD Head:  Normocephalic and atraumatic. Neck:  Supple; no masses or thyromegaly. Lungs:  Clear throughout to auscultation.    Heart:  Regular rate and rhythm. Abdomen:  Soft, nontender and nondistended. Normal bowel sounds, without guarding, and without rebound.   Neurologic:  Alert and  oriented x4;  grossly normal neurologically.  Impression/Plan: [name] is here for an endoscopy and colonoscopy to be performed for abdominal pain , iron deficiency anemia   Risks, benefits, limitations, and alternatives regarding  endoscopy and colonoscopy have been reviewed with the patient.  Questions have been answered.  All parties agreeable.   Kari Bellows, MD  06/27/2016, 12:11 PM

## 2016-06-27 NOTE — Op Note (Signed)
[facility] Gastroenterology Patient Name: [name] Procedure Date: 06/27/2016 12:12 PM MRN: [id number] Account #: [id number] Date of Birth: Dec 24, 1975 Admit Type: Ambulatory Age: 41 Room: [facility] ENDO ROOM 1 Gender: Female Note Status: Finalized Procedure:            Upper GI endoscopy Indications:          Iron deficiency anemia, Indigestion Providers:            Jonathon Bellows MD, MD Medicines:            Monitored Anesthesia Care Complications:        No immediate complications. Procedure:            Pre-Anesthesia Assessment:                       - Prior to the procedure, a History and Physical was                        performed, and patient medications, allergies and                        sensitivities were reviewed. The patient's tolerance of                        previous anesthesia was reviewed.                       - The risks and benefits of the procedure and the                        sedation options and risks were discussed with the                        patient. All questions were answered and informed                        consent was obtained.                       - ASA Grade Assessment: III - A patient with severe                        systemic disease.                       After obtaining informed consent, the endoscope was                        passed under direct vision. Throughout the procedure,                        the patient's blood pressure, pulse, and oxygen                        saturations were monitored continuously. The Endoscope                        was introduced through the mouth, and advanced to the                        third part of duodenum. The upper GI endoscopy was  accomplished with ease. The patient tolerated the                        procedure well. Findings:      The esophagus was normal.      The stomach was normal.      The examined duodenum was normal.      The examined  duodenum was normal. Biopsies for histology were taken with       a cold forceps for evaluation of celiac disease.      The entire examined stomach was normal. Biopsies were taken with a cold       forceps for histology. Impression:           - Normal esophagus.                       - Normal stomach.                       - Normal examined duodenum.                       - Normal examined duodenum. Biopsied.                       - Normal stomach. Biopsied. Recommendation:       - Await pathology results.                       - Perform a colonoscopy today. Procedure Code(s):    --- Professional ---                       [phone], Esophagogastroduodenoscopy, flexible, transoral;                        with biopsy, single or multiple Diagnosis Code(s):    --- Professional ---                       D50.9, Iron deficiency anemia, unspecified                       K30, Functional dyspepsia CPT copyright 2016 American Medical Association. All rights reserved. The codes documented in this report are preliminary and upon coder review may  be revised to meet current compliance requirements. Jonathon Bellows, MD Jonathon Bellows MD, MD 06/27/2016 12:22:26 PM This report has been signed electronically. Number of Addenda: 0 Note Initiated On: 06/27/2016 12:12 PM      [facility]

## 2016-06-28 ENCOUNTER — Encounter: Payer: Self-pay | Admitting: Gastroenterology

## 2016-06-28 NOTE — Anesthesia Postprocedure Evaluation (Signed)
Anesthesia Post Note  Patient: [name]  Procedure(s) Performed: Procedure(s) (LRB): COLONOSCOPY WITH PROPOFOL (N/A) ESOPHAGOGASTRODUODENOSCOPY (EGD) WITH PROPOFOL (N/A)  Patient location during evaluation: PACU Anesthesia Type: General Level of consciousness: awake and alert Pain management: pain level controlled Vital Signs Assessment: post-procedure vital signs reviewed and stable Respiratory status: spontaneous breathing, nonlabored ventilation, respiratory function stable and patient connected to nasal cannula oxygen Cardiovascular status: blood pressure returned to baseline and stable Postop Assessment: no signs of nausea or vomiting Anesthetic complications: no     Last Vitals:  Vitals:   06/27/16 1051  BP: 122/71  Pulse: 74  Resp: 16  Temp: (!) 35.9 C    Last Pain:  Vitals:   06/27/16 1051  TempSrc: Tympanic                 [name]

## 2016-06-29 LAB — SURGICAL PATHOLOGY

## 2016-07-26 ENCOUNTER — Encounter: Payer: Self-pay | Admitting: Gastroenterology

## 2016-07-31 ENCOUNTER — Ambulatory Visit: Payer: 59 | Admitting: Gastroenterology

## 2016-08-03 ENCOUNTER — Other Ambulatory Visit: Payer: Self-pay | Admitting: Adult Health

## 2016-08-03 DIAGNOSIS — Z1231 Encounter for screening mammogram for malignant neoplasm of breast: Secondary | ICD-10-CM

## 2016-08-20 ENCOUNTER — Encounter: Payer: Self-pay | Admitting: Emergency Medicine

## 2016-08-20 ENCOUNTER — Emergency Department: Payer: BLUE CROSS/BLUE SHIELD

## 2016-08-20 ENCOUNTER — Inpatient Hospital Stay
Admission: EM | Admit: 2016-08-20 | Discharge: 2016-08-23 | DRG: 190 | Disposition: A | Discharge status: Home / Self Care | Payer: BLUE CROSS/BLUE SHIELD | Attending: Internal Medicine | Admitting: Internal Medicine

## 2016-08-20 DIAGNOSIS — Z9981 Dependence on supplemental oxygen: Secondary | ICD-10-CM

## 2016-08-20 DIAGNOSIS — B9789 Other viral agents as the cause of diseases classified elsewhere: Secondary | ICD-10-CM

## 2016-08-20 DIAGNOSIS — F17210 Nicotine dependence, cigarettes, uncomplicated: Secondary | ICD-10-CM | POA: Diagnosis present

## 2016-08-20 DIAGNOSIS — K219 Gastro-esophageal reflux disease without esophagitis: Secondary | ICD-10-CM | POA: Diagnosis present

## 2016-08-20 DIAGNOSIS — J441 Chronic obstructive pulmonary disease with (acute) exacerbation: Principal | ICD-10-CM | POA: Diagnosis present

## 2016-08-20 DIAGNOSIS — Z7951 Long term (current) use of inhaled steroids: Secondary | ICD-10-CM

## 2016-08-20 DIAGNOSIS — Z79899 Other long term (current) drug therapy: Secondary | ICD-10-CM

## 2016-08-20 DIAGNOSIS — F329 Major depressive disorder, single episode, unspecified: Secondary | ICD-10-CM | POA: Diagnosis present

## 2016-08-20 DIAGNOSIS — R0602 Shortness of breath: Secondary | ICD-10-CM | POA: Diagnosis not present

## 2016-08-20 DIAGNOSIS — J069 Acute upper respiratory infection, unspecified: Secondary | ICD-10-CM

## 2016-08-20 DIAGNOSIS — E039 Hypothyroidism, unspecified: Secondary | ICD-10-CM | POA: Diagnosis present

## 2016-08-20 DIAGNOSIS — J9621 Acute and chronic respiratory failure with hypoxia: Secondary | ICD-10-CM | POA: Diagnosis present

## 2016-08-20 DIAGNOSIS — J40 Bronchitis, not specified as acute or chronic: Secondary | ICD-10-CM | POA: Diagnosis present

## 2016-08-20 LAB — BASIC METABOLIC PANEL
Anion gap: 7 (ref 5–15)
BUN: 7 mg/dL (ref 6–20)
CALCIUM: 9.4 mg/dL (ref 8.9–10.3)
CO2: 23 mmol/L (ref 22–32)
Chloride: 104 mmol/L (ref 101–111)
Creatinine, Ser: 0.81 mg/dL (ref 0.44–1.00)
GFR calc Af Amer: >60 mL/min (ref >60)
GLUCOSE: 100 mg/dL — AB (ref 65–99)
Potassium: 3.8 mmol/L (ref 3.5–5.1)
Sodium: 134 mmol/L — ABNORMAL LOW (ref 135–145)

## 2016-08-20 LAB — CBC
HCT: 36.7 % (ref 35.0–47.0)
Hemoglobin: 11.9 g/dL — ABNORMAL LOW (ref 12.0–16.0)
MCH: 25.5 pg — ABNORMAL LOW (ref 26.0–34.0)
MCHC: 32.5 g/dL (ref 32.0–36.0)
MCV: 78.5 fL — ABNORMAL LOW (ref 80.0–100.0)
Platelets: 270 10*3/uL (ref 150–440)
RBC: 4.68 MIL/uL (ref 3.80–5.20)
RDW: 17.5 % — AB (ref 11.5–14.5)
WBC: 4.1 10*3/uL (ref 3.6–11.0)

## 2016-08-20 LAB — TROPONIN I: Troponin I: <0.03 ng/mL (ref <0.03)

## 2016-08-20 LAB — POCT RAPID STREP A: STREPTOCOCCUS, GROUP A SCREEN (DIRECT): NEGATIVE

## 2016-08-20 MED ORDER — METHYLPREDNISOLONE SODIUM SUCC 125 MG IJ SOLR
125.0000 mg | Freq: Once | INTRAMUSCULAR | Status: AC
  Administered 2016-08-21: 125 mg via INTRAVENOUS
  Filled 2016-08-20: qty 2

## 2016-08-20 MED ORDER — IPRATROPIUM-ALBUTEROL 0.5-2.5 (3) MG/3ML IN SOLN
3.0000 mL | Freq: Once | RESPIRATORY_TRACT | Status: AC
  Administered 2016-08-20: 3 mL via RESPIRATORY_TRACT

## 2016-08-20 MED ORDER — IPRATROPIUM-ALBUTEROL 0.5-2.5 (3) MG/3ML IN SOLN
3.0000 mL | Freq: Once | RESPIRATORY_TRACT | Status: AC
  Administered 2016-08-21: 3 mL via RESPIRATORY_TRACT
  Filled 2016-08-20: qty 3

## 2016-08-20 MED ORDER — IPRATROPIUM-ALBUTEROL 0.5-2.5 (3) MG/3ML IN SOLN
3.0000 mL | Freq: Once | RESPIRATORY_TRACT | Status: AC
  Administered 2016-08-20: 3 mL via RESPIRATORY_TRACT
  Filled 2016-08-20: qty 9

## 2016-08-20 MED ORDER — SODIUM CHLORIDE 0.9 % IV BOLUS (SEPSIS)
1000.0000 mL | INTRAVENOUS | Status: AC
  Administered 2016-08-21: 1000 mL via INTRAVENOUS

## 2016-08-20 NOTE — ED Triage Notes (Addendum)
Pt presents to ED with wheezing, sob, and chest pain. Productive cough since yesterday am with clear sputum. Hx of COPD. Audible wheezing present in triage with labored respirations. Breathing tx today with no relief. Breathing has worsened today. Also c/o sore throat.

## 2016-08-21 DIAGNOSIS — J441 Chronic obstructive pulmonary disease with (acute) exacerbation: Secondary | ICD-10-CM | POA: Diagnosis present

## 2016-08-21 DIAGNOSIS — R0602 Shortness of breath: Secondary | ICD-10-CM | POA: Diagnosis present

## 2016-08-21 DIAGNOSIS — Z7951 Long term (current) use of inhaled steroids: Secondary | ICD-10-CM | POA: Diagnosis not present

## 2016-08-21 DIAGNOSIS — J9621 Acute and chronic respiratory failure with hypoxia: Secondary | ICD-10-CM | POA: Diagnosis present

## 2016-08-21 DIAGNOSIS — F17210 Nicotine dependence, cigarettes, uncomplicated: Secondary | ICD-10-CM | POA: Diagnosis present

## 2016-08-21 DIAGNOSIS — F329 Major depressive disorder, single episode, unspecified: Secondary | ICD-10-CM | POA: Diagnosis present

## 2016-08-21 DIAGNOSIS — Z79899 Other long term (current) drug therapy: Secondary | ICD-10-CM | POA: Diagnosis not present

## 2016-08-21 DIAGNOSIS — J069 Acute upper respiratory infection, unspecified: Secondary | ICD-10-CM | POA: Diagnosis present

## 2016-08-21 DIAGNOSIS — Z9981 Dependence on supplemental oxygen: Secondary | ICD-10-CM | POA: Diagnosis not present

## 2016-08-21 DIAGNOSIS — J40 Bronchitis, not specified as acute or chronic: Secondary | ICD-10-CM | POA: Diagnosis present

## 2016-08-21 DIAGNOSIS — E039 Hypothyroidism, unspecified: Secondary | ICD-10-CM | POA: Diagnosis present

## 2016-08-21 DIAGNOSIS — K219 Gastro-esophageal reflux disease without esophagitis: Secondary | ICD-10-CM | POA: Diagnosis present

## 2016-08-21 LAB — BASIC METABOLIC PANEL
Anion gap: 8 (ref 5–15)
BUN: 7 mg/dL (ref 6–20)
CHLORIDE: 107 mmol/L (ref 101–111)
CO2: 20 mmol/L — AB (ref 22–32)
Calcium: 8.7 mg/dL — ABNORMAL LOW (ref 8.9–10.3)
Creatinine, Ser: 0.59 mg/dL (ref 0.44–1.00)
GFR calc Af Amer: >60 mL/min (ref >60)
GFR calc non Af Amer: >60 mL/min (ref >60)
GLUCOSE: 135 mg/dL — AB (ref 65–99)
POTASSIUM: 3.6 mmol/L (ref 3.5–5.1)
Sodium: 135 mmol/L (ref 135–145)

## 2016-08-21 LAB — CBC
HEMATOCRIT: 34.9 % — AB (ref 35.0–47.0)
Hemoglobin: 11.4 g/dL — ABNORMAL LOW (ref 12.0–16.0)
MCH: 25.8 pg — AB (ref 26.0–34.0)
MCHC: 32.7 g/dL (ref 32.0–36.0)
MCV: 78.9 fL — ABNORMAL LOW (ref 80.0–100.0)
Platelets: 217 10*3/uL (ref 150–440)
RBC: 4.43 MIL/uL (ref 3.80–5.20)
RDW: 17.7 % — AB (ref 11.5–14.5)
WBC: 3.3 10*3/uL — ABNORMAL LOW (ref 3.6–11.0)

## 2016-08-21 MED ORDER — PHENOL 1.4 % MT LIQD
1.0000 | OROMUCOSAL | Status: DC | PRN
  Administered 2016-08-22: 1 via OROMUCOSAL
  Filled 2016-08-21: qty 177

## 2016-08-21 MED ORDER — DEXTROSE 5 % IV SOLN: 500.0000 mg | INTRAVENOUS | Status: DC

## 2016-08-21 MED ORDER — ALBUTEROL SULFATE (2.5 MG/3ML) 0.083% IN NEBU: 2.5000 mg | INHALATION_SOLUTION | Freq: Four times a day (QID) | RESPIRATORY_TRACT | Status: DC | PRN

## 2016-08-21 MED ORDER — POLYETHYLENE GLYCOL 3350 17 GM/SCOOP PO POWD
1.0000 | Freq: Every day | ORAL | Status: DC
  Filled 2016-08-21: qty 255

## 2016-08-21 MED ORDER — ACETAMINOPHEN 325 MG PO TABS
650.0000 mg | ORAL_TABLET | Freq: Four times a day (QID) | ORAL | Status: DC | PRN
  Administered 2016-08-21: 650 mg via ORAL
  Filled 2016-08-21: qty 2

## 2016-08-21 MED ORDER — DEXTROMETHORPHAN POLISTIREX ER 30 MG/5ML PO SUER
30.0000 mg | Freq: Two times a day (BID) | ORAL | Status: DC
  Administered 2016-08-21 – 2016-08-23 (×5): 30 mg via ORAL
  Filled 2016-08-21 (×7): qty 5

## 2016-08-21 MED ORDER — DM-GUAIFENESIN ER 30-600 MG PO TB12: 1.0000 | ORAL_TABLET | Freq: Two times a day (BID) | ORAL | Status: DC

## 2016-08-21 MED ORDER — BISACODYL 5 MG PO TBEC: 5.0000 mg | DELAYED_RELEASE_TABLET | Freq: Every day | ORAL | Status: DC | PRN

## 2016-08-21 MED ORDER — IPRATROPIUM BROMIDE 0.02 % IN SOLN: 0.5000 mg | Freq: Four times a day (QID) | RESPIRATORY_TRACT | Status: DC | PRN

## 2016-08-21 MED ORDER — MOMETASONE FURO-FORMOTEROL FUM 200-5 MCG/ACT IN AERO
2.0000 | INHALATION_SPRAY | Freq: Two times a day (BID) | RESPIRATORY_TRACT | Status: DC
  Administered 2016-08-21 – 2016-08-22 (×4): 2 via RESPIRATORY_TRACT
  Filled 2016-08-21: qty 8.8

## 2016-08-21 MED ORDER — MAGNESIUM CITRATE PO SOLN
1.0000 | Freq: Once | ORAL | Status: DC | PRN
  Filled 2016-08-21: qty 296

## 2016-08-21 MED ORDER — SODIUM CHLORIDE 0.9 % IV SOLN
INTRAVENOUS | Status: DC
  Administered 2016-08-21 – 2016-08-22 (×3): via INTRAVENOUS

## 2016-08-21 MED ORDER — SODIUM CHLORIDE 0.9% FLUSH
3.0000 mL | Freq: Two times a day (BID) | INTRAVENOUS | Status: DC
  Administered 2016-08-21 – 2016-08-22 (×2): 3 mL via INTRAVENOUS

## 2016-08-21 MED ORDER — MONTELUKAST SODIUM 10 MG PO TABS
10.0000 mg | ORAL_TABLET | Freq: Every day | ORAL | Status: DC
  Administered 2016-08-21 – 2016-08-22 (×2): 10 mg via ORAL
  Filled 2016-08-21 (×2): qty 1

## 2016-08-21 MED ORDER — METHYLPREDNISOLONE SODIUM SUCC 125 MG IJ SOLR
60.0000 mg | Freq: Four times a day (QID) | INTRAMUSCULAR | Status: DC
  Administered 2016-08-21 – 2016-08-23 (×10): 60 mg via INTRAVENOUS
  Filled 2016-08-21 (×10): qty 2

## 2016-08-21 MED ORDER — ONDANSETRON HCL 4 MG PO TABS: 4.0000 mg | ORAL_TABLET | Freq: Four times a day (QID) | ORAL | Status: DC | PRN

## 2016-08-21 MED ORDER — ALBUTEROL SULFATE (2.5 MG/3ML) 0.083% IN NEBU
2.5000 mg | INHALATION_SOLUTION | Freq: Once | RESPIRATORY_TRACT | Status: AC
  Administered 2016-08-21: 2.5 mg via RESPIRATORY_TRACT
  Filled 2016-08-21: qty 3

## 2016-08-21 MED ORDER — AZITHROMYCIN 500 MG PO TABS
500.0000 mg | ORAL_TABLET | ORAL | Status: AC
  Administered 2016-08-21: 500 mg via ORAL
  Filled 2016-08-21: qty 1

## 2016-08-21 MED ORDER — ONDANSETRON HCL 4 MG/2ML IJ SOLN: 4.0000 mg | Freq: Four times a day (QID) | INTRAMUSCULAR | Status: DC | PRN

## 2016-08-21 MED ORDER — OXYCODONE HCL 5 MG PO TABS
5.0000 mg | ORAL_TABLET | ORAL | Status: DC | PRN
  Administered 2016-08-21 – 2016-08-22 (×3): 5 mg via ORAL
  Filled 2016-08-21 (×3): qty 1

## 2016-08-21 MED ORDER — FLUOXETINE HCL 20 MG PO CAPS
40.0000 mg | ORAL_CAPSULE | Freq: Every day | ORAL | Status: DC
  Administered 2016-08-21 – 2016-08-23 (×3): 40 mg via ORAL
  Filled 2016-08-21 (×3): qty 2

## 2016-08-21 MED ORDER — DICYCLOMINE HCL 20 MG PO TABS
20.0000 mg | ORAL_TABLET | Freq: Three times a day (TID) | ORAL | Status: DC | PRN
  Filled 2016-08-21: qty 1

## 2016-08-21 MED ORDER — POLYETHYLENE GLYCOL 3350 17 G PO PACK
17.0000 g | PACK | Freq: Every day | ORAL | Status: DC
  Administered 2016-08-22 – 2016-08-23 (×2): 17 g via ORAL
  Filled 2016-08-21 (×2): qty 1

## 2016-08-21 MED ORDER — AZITHROMYCIN 500 MG PO TABS
500.0000 mg | ORAL_TABLET | Freq: Every day | ORAL | Status: DC
  Administered 2016-08-22 – 2016-08-23 (×2): 500 mg via ORAL
  Filled 2016-08-21 (×2): qty 1

## 2016-08-21 MED ORDER — SENNOSIDES-DOCUSATE SODIUM 8.6-50 MG PO TABS
1.0000 | ORAL_TABLET | Freq: Every evening | ORAL | Status: DC | PRN
Start: 2016-08-21 — End: 2016-08-23

## 2016-08-21 MED ORDER — LEVOTHYROXINE SODIUM 25 MCG PO TABS
125.0000 ug | ORAL_TABLET | Freq: Every day | ORAL | Status: DC
  Administered 2016-08-22 – 2016-08-23 (×2): 125 ug via ORAL
  Filled 2016-08-21 (×2): qty 1

## 2016-08-21 MED ORDER — IPRATROPIUM-ALBUTEROL 0.5-2.5 (3) MG/3ML IN SOLN
RESPIRATORY_TRACT | Status: AC
  Filled 2016-08-21: qty 3

## 2016-08-21 MED ORDER — GUAIFENESIN ER 600 MG PO TB12
600.0000 mg | ORAL_TABLET | Freq: Two times a day (BID) | ORAL | Status: DC
Start: 2016-08-21 — End: 2016-08-23
  Administered 2016-08-21 – 2016-08-23 (×6): 600 mg via ORAL
  Filled 2016-08-21 (×6): qty 1

## 2016-08-21 MED ORDER — ACETAMINOPHEN 650 MG RE SUPP: 650.0000 mg | Freq: Four times a day (QID) | RECTAL | Status: DC | PRN

## 2016-08-21 MED ORDER — IPRATROPIUM-ALBUTEROL 0.5-2.5 (3) MG/3ML IN SOLN
3.0000 mL | Freq: Four times a day (QID) | RESPIRATORY_TRACT | Status: DC | PRN
  Administered 2016-08-21 – 2016-08-22 (×4): 3 mL via RESPIRATORY_TRACT
  Filled 2016-08-21 (×4): qty 3

## 2016-08-21 MED ORDER — MAGNESIUM SULFATE 2 GM/50ML IV SOLN
2.0000 g | Freq: Once | INTRAVENOUS | Status: AC
  Administered 2016-08-21: 2 g via INTRAVENOUS
  Filled 2016-08-21: qty 50

## 2016-08-21 NOTE — H&P (Signed)
History and Physical   [facility] - [place] @ [facility] Admission History and Physical [employer], D.O.    Patient Name: [name] MR#: [id number] Date of Birth: 1975/08/22 Date of Admission: 08/20/2016   Referring MD/NP/PA: Dr. Karma Greaser Primary Care Physician: Nat Christen, PA-C  Chief Complaint:  Chief Complaint  Patient presents with  . Shortness of Breath  . Chest Pain    HPI: [name] is a 41 y.o. female with a known history of Asthma, home O2 dependent COPD, depression, alcohol use presents to the emergency department for evaluation of this of breath.  Patient was in a usual state of health until one day prior to arrival in the emergency department when she developed symptoms of upper respiratory infection including nasal congestion and sore throat. She sought medical attention this evening because symptoms were progressively worsening to include productive cough despite home medications including nebulizers. She reports associated chest pain with coughing.  Patient denies fevers/chills, weakness, dizziness, N/V/C/D, abdominal pain, dysuria/frequency, changes in mental status.    Otherwise there has been no change in status. Patient has been taking medication as prescribed and there has been no recent change in medication or diet.  No recent antibiotics.  There has been no recent illness, hospitalizations, travel or sick contacts.    EMS/ED Course: Patient received DuoNeb 6, for a Medrol, magnesium, normal saline and a azithromycin. Medical admission was requested for further management of COPD exacerbation.  Review of Systems:  CONSTITUTIONAL: No fever/chills, fatigue, weakness, weight gain/loss, headache. EYES: No blurry or double vision. ENT: Positive nasal sinus congestion. No tinnitus, postnasal drip, redness or soreness of the oropharynx. RESPIRATORY: Positive cough, dyspnea, wheeze.  No hemoptysis.  CARDIOVASCULAR: Positive chest pain with  cough. Negative palpitations, syncope, orthopnea. No lower extremity edema.  GASTROINTESTINAL: No nausea, vomiting, abdominal pain, diarrhea, constipation.  No hematemesis, melena or hematochezia. GENITOURINARY: No dysuria, frequency, hematuria. ENDOCRINE: No polyuria or nocturia. No heat or cold intolerance. HEMATOLOGY: No anemia, bruising, bleeding. INTEGUMENTARY: No rashes, ulcers, lesions. MUSCULOSKELETAL: No arthritis, gout, dyspnea. NEUROLOGIC: No numbness, tingling, ataxia, seizure-type activity, weakness. PSYCHIATRIC: No anxiety, depression, insomnia.   Past Medical History:  Diagnosis Date  . Alcohol abuse   . Anemia 01/31/2011  . Asthma   . COPD (chronic obstructive pulmonary disease) ([place])   . Depression   . Hypoxia 02/01/2011  . Needs sleep apnea assessment 2013.07.31  . Tobacco abuse     Past Surgical History:  Procedure Laterality Date  . ANKLE GANGLION CYST EXCISION    . BACK SURGERY    . CESAREAN SECTION    . COLONOSCOPY WITH PROPOFOL N/A 06/27/2016   Procedure: COLONOSCOPY WITH PROPOFOL;  Surgeon: Jonathon Bellows, MD;  Location: [facility] ENDOSCOPY;  Service: Endoscopy;  Laterality: N/A;  . DILATION AND CURETTAGE OF UTERUS    . ESOPHAGOGASTRODUODENOSCOPY (EGD) WITH PROPOFOL N/A 06/27/2016   Procedure: ESOPHAGOGASTRODUODENOSCOPY (EGD) WITH PROPOFOL;  Surgeon: Jonathon Bellows, MD;  Location: [facility] ENDOSCOPY;  Service: Endoscopy;  Laterality: N/A;     reports that she has been smoking Cigarettes.  She has a 20.00 pack-year smoking history. She has never used smokeless tobacco. She reports that she drinks alcohol. She reports that she does not use drugs.  No Known Allergies  Family History  Problem Relation Age of Onset  . Hypothyroidism Mother   . Coronary artery disease Mother   . Throat cancer Mother   . Diabetes Maternal Grandmother     Prior to Admission medications   Medication Sig Start  Date End Date Taking? Authorizing Provider  FLUoxetine (PROZAC) 40 MG capsule  Take 40 mg by mouth daily. 08/05/16  Yes [provider]  ipratropium-albuterol (DUONEB) 0.5-2.5 (3) MG/3ML SOLN Inhale 3 mLs into the lungs every 6 (six) hours as needed. 08/14/16  Yes [provider]  albuterol (PROVENTIL HFA;VENTOLIN HFA) 108 (90 Base) MCG/ACT inhaler Inhale 1-2 puffs into the lungs every 6 (six) hours as needed for wheezing or shortness of breath.    [provider]  albuterol (PROVENTIL) (2.5 MG/3ML) 0.083% nebulizer solution Take 3 mLs (2.5 mg total) by nebulization every 6 (six) hours as needed for wheezing or shortness of breath. For asthma Patient not taking: Reported on 06/06/2016 02/01/11   Rexene Alberts, MD  azithromycin (ZITHROMAX) 500 MG tablet Take 1 tablet (500 mg total) by mouth daily. Patient not taking: Reported on 06/06/2016 02/13/16   Epifanio Lesches, MD  dicyclomine (BENTYL) 20 MG tablet Take 1 tablet (20 mg total) by mouth 3 (three) times daily as needed for spasms. 06/06/16 06/06/17  Jonathon Bellows, MD  Fluticasone-Salmeterol (ADVAIR) 250-50 MCG/DOSE AEPB Inhale 1 puff into the lungs 2 (two) times daily.    [provider]  levofloxacin (LEVAQUIN) 750 MG tablet Take 1 tablet (750 mg total) by mouth daily. Patient not taking: Reported on 06/06/2016 02/14/16   Epifanio Lesches, MD  levothyroxine (SYNTHROID, LEVOTHROID) 125 MCG tablet Take 125 mcg by mouth daily before breakfast.    [provider]  montelukast (SINGULAIR) 10 MG tablet Take 10 mg by mouth at bedtime.    [provider]  omeprazole (PRILOSEC) 40 MG capsule Take 1 capsule (40 mg total) by mouth daily. Patient not taking: Reported on 06/27/2016 05/07/16 05/07/17  Eula Listen, MD  ondansetron (ZOFRAN ODT) 4 MG disintegrating tablet Take 1 tablet (4 mg total) by mouth every 8 (eight) hours as needed for nausea or vomiting. 05/07/16   Eula Listen, MD  polyethylene glycol powder (GLYCOLAX/MIRALAX) powder Take 255 g by mouth daily.  06/06/16   Jonathon Bellows, MD  predniSONE (DELTASONE) 20 MG tablet Take 2 tablets (40 mg total) by mouth daily. Patient not taking: Reported on 06/06/2016 05/10/16 05/10/17  Orbie Pyo, MD  sucralfate (CARAFATE) 1 g tablet Take 1 tablet (1 g total) by mouth 4 (four) times daily. Patient not taking: Reported on 06/27/2016 05/10/16 05/10/17  Orbie Pyo, MD    Physical Exam: Vitals:   08/20/16 2226 08/21/16 0000 08/21/16 0030 08/21/16 0100  BP: (!) 101/49 96/64 104/60 (!) 100/57  Pulse: (!) 119 94 (!) 109 (!) 114  Resp: (!) 28 (!) 27 (!) 21 18  Temp: 98.6 F (37 C)     TempSrc: Oral     SpO2: 94% 97% 93% 90%  Weight: 90.7 kg (200 lb)     Height: 5\' 5"  (1.651 m)       GENERAL: 41 y.o.-year-old Female patient, well-developed, well-nourished lying in the bed in no acute distress.  Pleasant and cooperative.   HEENT: Head atraumatic, normocephalic. Pupils equal, round, reactive to light and accommodation. No scleral icterus.  Mucus membranes moist. NECK: Supple, full range of motion.  CHEST:  Diffuse expiratory wheezing. Adequate air exchange. No use of accessory muscles of respiration.  No reproducible chest wall tenderness.  CARDIOVASCULAR: S1, S2 normal. No murmurs, rubs, or gallops. Cap refill <2 seconds. Pulses intact distally.  ABDOMEN: Soft, nondistended, nontender. No rebound, guarding, rigidity. Normoactive bowel sounds present in all four quadrants. No organomegaly or mass. EXTREMITIES: No pedal  edema, cyanosis, or clubbing. No calf tenderness or Homan's sign.  NEUROLOGIC: The patient is alert and oriented x 3. Cranial nerves II through XII are grossly intact with no focal sensorimotor deficit.  PSYCHIATRIC:  Normal affect, mood, thought content. SKIN: Warm, dry, and intact without obvious rash, lesion, or ulcer.    Labs on Admission:  CBC:  Recent Labs Lab 08/20/16 2232  WBC 4.1  HGB 11.9*  HCT 36.7  MCV 78.5*  PLT 606   Basic Metabolic  Panel:  Recent Labs Lab 08/20/16 2232  NA 134*  K 3.8  CL 104  CO2 23  GLUCOSE 100*  BUN 7  CREATININE 0.81  CALCIUM 9.4   GFR: Estimated Creatinine Clearance: 100.7 mL/min (by C-G formula based on SCr of 0.81 mg/dL). Liver Function Tests: No results for input(s): AST, ALT, ALKPHOS, BILITOT, PROT, ALBUMIN in the last 168 hours. No results for input(s): LIPASE, AMYLASE in the last 168 hours. No results for input(s): AMMONIA in the last 168 hours. Coagulation Profile: No results for input(s): INR, PROTIME in the last 168 hours. Cardiac Enzymes:  Recent Labs Lab 08/20/16 2232  TROPONINI <0.03   BNP (last 3 results) No results for input(s): PROBNP in the last 8760 hours. HbA1C: No results for input(s): HGBA1C in the last 72 hours. CBG: No results for input(s): GLUCAP in the last 168 hours. Lipid Profile: No results for input(s): CHOL, HDL, LDLCALC, TRIG, CHOLHDL, LDLDIRECT in the last 72 hours. Thyroid Function Tests: No results for input(s): TSH, T4TOTAL, FREET4, T3FREE, THYROIDAB in the last 72 hours. Anemia Panel: No results for input(s): VITAMINB12, FOLATE, FERRITIN, TIBC, IRON, RETICCTPCT in the last 72 hours. Urine analysis:    Component Value Date/Time   COLORURINE STRAW (A) 05/10/2016 1227   APPEARANCEUR CLEAR (A) 05/10/2016 1227   APPEARANCEUR Hazy 05/10/2013 1216   LABSPEC 1.001 (L) 05/10/2016 1227   LABSPEC 1.014 05/10/2013 1216   PHURINE 8.0 05/10/2016 1227   GLUCOSEU NEGATIVE 05/10/2016 1227   GLUCOSEU Negative 05/10/2013 1216   HGBUR SMALL (A) 05/10/2016 1227   BILIRUBINUR NEGATIVE 05/10/2016 1227   BILIRUBINUR Negative 05/10/2013 1216   KETONESUR NEGATIVE 05/10/2016 1227   PROTEINUR NEGATIVE 05/10/2016 1227   UROBILINOGEN 0.2 09/10/2011 0250   NITRITE NEGATIVE 05/10/2016 1227   LEUKOCYTESUR NEGATIVE 05/10/2016 1227   LEUKOCYTESUR Negative 05/10/2013 1216   Sepsis Labs: @LABRCNTIP (procalcitonin:4,lacticidven:4) )No results found for this or  any previous visit (from the past 240 hour(s)).   Radiological Exams on Admission: Dg Chest 2 View  Result Date: 08/20/2016 CLINICAL DATA:  41 year old female with acute onset shortness of breath. EXAM: CHEST  2 VIEW COMPARISON:  Chest radiograph dated 02/10/2016 FINDINGS: There are linear peribronchial densities predominantly involving the mid to lower lung fields. Focal area of hazy density at the right lung base, likely right middle lobe noted. These findings may be chronic, however developing pneumonia is not excluded. Clinical correlation is recommended. There is no pleural effusion or pneumothorax. The cardiac silhouette is within normal limits. No acute osseous pathology. IMPRESSION: Bilateral lower lung field interstitial and peribronchial densities, likely chronic. Developing pneumonia is not excluded. Clinical correlation is recommended. Electronically Signed   By: Anner Crete [name]   On: 08/20/2016 23:19    EKG: Sinus tachycardia at 118 bpm with normal axis and nonspecific ST-T wave changes.   Assessment/Plan  This is a 41 y.o. female with a history of  home O2 dependent COPD, asthma, depression, alcohol use now being admitted with:  #. Acute exacerbation  of COPD - Admit inpatient - IV steroids and azithromycin - Nebulizers, O2 therapy and expectorants as needed.  - Check sputum culture - Continuous pulse oximetry - Pulmonary consult -Continue Advair, Singulair  #. History of depression -Continue Prozac  #. History of hypothyroidism -Continue Synthroid   All the records are reviewed and case discussed with ED provider. Management plans discussed with the patient and/or family who express understanding and agree with plan of care.  Keajah Killough D.O. on 08/21/2016 at 1:18 AM Between 7am to 6pm - Pager - [phone] After 6pm go to [email or web address] - [facility] Office [phone] CC: Primary care physician; Nat Christen, PA-C   08/21/2016, 1:18 AM

## 2016-08-21 NOTE — Care Management Note (Signed)
Case Management Note  Patient Details  Name: [name] MRN: [id number] Date of Birth: 1975-06-11  Subjective/Objective:  RNCM assessment for discharge planning. Per medical record patient lives alone. ETOH abuse. Admitted with COPD exacerbation. Has home nebulizer.                   Action/Plan: Will follow progression and assit with discharge needs.   Expected Discharge Date:                  Expected Discharge Plan:     In-House Referral:     Discharge planning Services  CM Consult  Post Acute Care Choice:    Choice offered to:     DME Arranged:    DME Agency:     HH Arranged:    HH Agency:     Status of Service:  In process, will continue to follow  If discussed at Long Length of Stay Meetings, dates discussed:    Additional Comments:  Jolly Mango, RN 08/21/2016, 8:34 AM

## 2016-08-21 NOTE — Progress Notes (Signed)
PHARMACIST - PHYSICIAN COMMUNICATION DR:   [place] CONCERNING: Antibiotic IV to Oral Route Change Policy  RECOMMENDATION: This patient is receiving Azithromycin  by the intravenous route.  Based on criteria approved by the Pharmacy and Therapeutics Committee, the antibiotic(s) is/are being converted to the equivalent oral dose form(s).   DESCRIPTION: These criteria include:  Patient being treated for a respiratory tract infection, urinary tract infection, cellulitis or clostridium difficile associated diarrhea if on metronidazole  The patient is not neutropenic and does not exhibit a GI malabsorption state  The patient is eating (either orally or via tube) and/or has been taking other orally administered medications for a least 24 hours  The patient is improving clinically and has a Tmax < 100.5  If you have questions about this conversion, please contact the Pharmacy Department  []   [phone] )  [facility] []   [phone] )  [facility] []   [phone] )  [facility] []   [phone] )  [facility] []   [phone] )  [facility]

## 2016-08-21 NOTE — Progress Notes (Signed)
Admitted  for COPD exacerbation this morning. Still had lots of wheezing and cough.  Labs, vital severe. Assessment and plan COPD exacerbation: Continued wheezing so continue bronchodilators, IV steroids, oxygen, advised to quit smoking, follow clinical course, likely discharge in next day or 2. Time spent 20 minutes.

## 2016-08-21 NOTE — ED Provider Notes (Signed)
[facility] Emergency Department Provider Note  ____________________________________________   First MD Initiated Contact with Patient 08/20/16 2358     (approximate)  I have reviewed the triage vital signs and the nursing notes.   HISTORY  Chief Complaint Shortness of Breath and Chest Pain    HPI [name] is a 41 y.o. female with a history of COPD on 2 L of oxygen at baseline as well as other chronic medical issues as listed below who presents for evaluation of gradually worsening shortness of breath and wheezing since yesterday.  She states that she was in her usual state of health until early yesterday when she developed a sore throat and felt like she might have a cold.  That started quickly developing into what feels like a COPD exacerbation.  She has been giving herself albuterol nebulizer treatments every 4 hours but they have not been helping.  Any amount of exertion makes her shortness of breath worse.  She subsequently developed some chest pressure and general central chest discomfort.  She has been coughing and it is productive of sputum.  She denies fever/chills, nausea, vomiting, abdominal pain.    Past Medical History:  Diagnosis Date  . Alcohol abuse   . Anemia 01/31/2011  . Asthma   . COPD (chronic obstructive pulmonary disease) ([place])   . Depression   . Hypoxia 02/01/2011  . Needs sleep apnea assessment 2013.07.31  . Tobacco abuse     Patient Active Problem List   Diagnosis Date Noted  . Hypokalemia 02/10/2016  . Hypothyroidism 02/10/2016  . CAP (community acquired pneumonia) 01/19/2013  . Dehydration 01/19/2013  . Nausea vomiting and diarrhea 01/19/2013  . Alcohol dependence ([place]) 01/16/2012  . Alcohol withdrawal ([place]) 01/16/2012  . Hypoxia 02/01/2011  . Hyponatremia 02/01/2011  . Anemia 01/31/2011  . Chest pain, pleuritic 01/29/2011  . Back pain 01/29/2011  . HCAP (healthcare-associated pneumonia) 01/28/2011  .  Acute bronchitis 01/08/2011  . COPD exacerbation ([place]) 01/08/2011  . Tobacco abuse 01/08/2011  . Polysubstance abuse 01/08/2011  . Major depression 01/08/2011    Past Surgical History:  Procedure Laterality Date  . ANKLE GANGLION CYST EXCISION    . BACK SURGERY    . CESAREAN SECTION    . COLONOSCOPY WITH PROPOFOL N/A 06/27/2016   Procedure: COLONOSCOPY WITH PROPOFOL;  Surgeon: Jonathon Bellows, MD;  Location: [facility] ENDOSCOPY;  Service: Endoscopy;  Laterality: N/A;  . DILATION AND CURETTAGE OF UTERUS    . ESOPHAGOGASTRODUODENOSCOPY (EGD) WITH PROPOFOL N/A 06/27/2016   Procedure: ESOPHAGOGASTRODUODENOSCOPY (EGD) WITH PROPOFOL;  Surgeon: Jonathon Bellows, MD;  Location: [facility] ENDOSCOPY;  Service: Endoscopy;  Laterality: N/A;    Prior to Admission medications   Medication Sig Start Date End Date Taking? Authorizing Provider  FLUoxetine (PROZAC) 40 MG capsule Take 40 mg by mouth daily. 08/05/16  Yes [provider]  ipratropium-albuterol (DUONEB) 0.5-2.5 (3) MG/3ML SOLN Inhale 3 mLs into the lungs every 6 (six) hours as needed. 08/14/16  Yes [provider]  albuterol (PROVENTIL HFA;VENTOLIN HFA) 108 (90 Base) MCG/ACT inhaler Inhale 1-2 puffs into the lungs every 6 (six) hours as needed for wheezing or shortness of breath.    [provider]  albuterol (PROVENTIL) (2.5 MG/3ML) 0.083% nebulizer solution Take 3 mLs (2.5 mg total) by nebulization every 6 (six) hours as needed for wheezing or shortness of breath. For asthma Patient not taking: Reported on 06/06/2016 02/01/11   Rexene Alberts, MD  azithromycin (ZITHROMAX) 500 MG tablet Take 1 tablet (500  mg total) by mouth daily. Patient not taking: Reported on 06/06/2016 02/13/16   Epifanio Lesches, MD  dicyclomine (BENTYL) 20 MG tablet Take 1 tablet (20 mg total) by mouth 3 (three) times daily as needed for spasms. 06/06/16 06/06/17  Jonathon Bellows, MD  Fluticasone-Salmeterol (ADVAIR) 250-50 MCG/DOSE AEPB Inhale 1 puff into the lungs 2  (two) times daily.    [provider]  levofloxacin (LEVAQUIN) 750 MG tablet Take 1 tablet (750 mg total) by mouth daily. Patient not taking: Reported on 06/06/2016 02/14/16   Epifanio Lesches, MD  levothyroxine (SYNTHROID, LEVOTHROID) 125 MCG tablet Take 125 mcg by mouth daily before breakfast.    [provider]  montelukast (SINGULAIR) 10 MG tablet Take 10 mg by mouth at bedtime.    [provider]  omeprazole (PRILOSEC) 40 MG capsule Take 1 capsule (40 mg total) by mouth daily. Patient not taking: Reported on 06/27/2016 05/07/16 05/07/17  Eula Listen, MD  ondansetron (ZOFRAN ODT) 4 MG disintegrating tablet Take 1 tablet (4 mg total) by mouth every 8 (eight) hours as needed for nausea or vomiting. 05/07/16   Eula Listen, MD  polyethylene glycol powder (GLYCOLAX/MIRALAX) powder Take 255 g by mouth daily. 06/06/16   Jonathon Bellows, MD  predniSONE (DELTASONE) 20 MG tablet Take 2 tablets (40 mg total) by mouth daily. Patient not taking: Reported on 06/06/2016 05/10/16 05/10/17  Orbie Pyo, MD  sucralfate (CARAFATE) 1 g tablet Take 1 tablet (1 g total) by mouth 4 (four) times daily. Patient not taking: Reported on 06/27/2016 05/10/16 05/10/17  Schaevitz, Randall An, MD    Allergies Patient has no known allergies.  Family History  Problem Relation Age of Onset  . Hypothyroidism Mother   . Coronary artery disease Mother   . Throat cancer Mother   . Diabetes Maternal Grandmother     Social History Social History  Substance Use Topics  . Smoking status: Current Every Day Smoker    Packs/day: 1.00    Years: 20.00    Types: Cigarettes    Last attempt to quit: 06/19/2012  . Smokeless tobacco: Never Used  . Alcohol use 0.0 oz/week     Comment: occ    Review of Systems Constitutional: No fever/chills Eyes: No visual changes. ENT: +sore throat. Cardiovascular: Chest discomfort with cough and wheezing Respiratory: Increasing difficulty  breathing with wheezing and productive cough Gastrointestinal: No abdominal pain.  No nausea, no vomiting.  No diarrhea.  No constipation. Genitourinary: Negative for dysuria. Musculoskeletal: Negative for neck pain.  Negative for back pain. Integumentary: Negative for rash. Neurological: Negative for headaches, focal weakness or numbness.   ____________________________________________   PHYSICAL EXAM:  VITAL SIGNS: ED Triage Vitals [08/20/16 2226]  Enc Vitals Group     BP (!) 101/49     Pulse Rate (!) 119     Resp (!) 28     Temp 98.6 F (37 C)     Temp Source Oral     SpO2 94 %     Weight 90.7 kg (200 lb)     Height 1.651 m (5\' 5" )     Head Circumference      Peak Flow      Pain Score      Pain Loc      Pain Edu?      Excl. in [place]?     Constitutional: Alert and oriented. Moderate respiratory distress Eyes: Conjunctivae are normal.  Head: Atraumatic. Nose: No congestion/rhinnorhea. Mouth/Throat: Mucous membranes are moist.  Oropharynx non-erythematous.  Neck: No stridor.  No meningeal signs.   Cardiovascular: Tachycardia, regular rhythm. Good peripheral circulation. Grossly normal heart sounds. Respiratory: Increased respiratory effort with intercostal retractions and accessory muscle usage.  Audible wheezing with and without stethoscope.  Coarse breath sounds throughout lung fields Gastrointestinal: Soft and nontender. No distention.  Musculoskeletal: No lower extremity tenderness nor edema. No gross deformities of extremities. Neurologic:  Normal speech and language. No gross focal neurologic deficits are appreciated.  Skin:  Skin is warm, dry and intact. No rash noted. Psychiatric: Mood and affect are normal. Speech and behavior are normal.  ____________________________________________   LABS (all labs ordered are listed, but only abnormal results are displayed)  Labs Reviewed  BASIC METABOLIC PANEL - Abnormal; Notable for the following:       Result Value    Sodium 134 (*)    Glucose, Bld 100 (*)    All other components within normal limits  CBC - Abnormal; Notable for the following:    Hemoglobin 11.9 (*)    MCV 78.5 (*)    MCH 25.5 (*)    RDW 17.5 (*)    All other components within normal limits  TROPONIN I  POCT RAPID STREP A   ____________________________________________  EKG  ED ECG REPORT I, Kyung Muto, the attending physician, personally viewed and interpreted this ECG.  Date: 08/20/2016 EKG Time: 22:27 Rate: 118 Rhythm: sinus tachycardia QRS Axis: normal Intervals: normal ST/T Wave abnormalities: Non-specific ST segment / T-wave changes, but no evidence of acute ischemia. Narrative Interpretation: no evidence of acute ischemia   ____________________________________________  RADIOLOGY   Dg Chest 2 View  Result Date: 08/20/2016 CLINICAL DATA:  41 year old female with acute onset shortness of breath. EXAM: CHEST  2 VIEW COMPARISON:  Chest radiograph dated 02/10/2016 FINDINGS: There are linear peribronchial densities predominantly involving the mid to lower lung fields. Focal area of hazy density at the right lung base, likely right middle lobe noted. These findings may be chronic, however developing pneumonia is not excluded. Clinical correlation is recommended. There is no pleural effusion or pneumothorax. The cardiac silhouette is within normal limits. No acute osseous pathology. IMPRESSION: Bilateral lower lung field interstitial and peribronchial densities, likely chronic. Developing pneumonia is not excluded. Clinical correlation is recommended. Electronically Signed   By: Anner Crete [name]   On: 08/20/2016 23:19    ____________________________________________   PROCEDURES  Critical Care performed: No   Procedure(s) performed:   Procedures   ____________________________________________   INITIAL IMPRESSION / ASSESSMENT AND PLAN / ED COURSE  Pertinent labs & imaging results that were available  during my care of the patient were reviewed by me and considered in my medical decision making (see chart for details).  Patient likely has a viral URI leading to her sore throat and other upper respiratory symptoms that led to an acute exacerbation of her chronic COPD.  I will treat her with Solu-Medrol and 3 DuoNeb's but she may require inpatient treatment.  She agrees with the plan to attempt maximal therapy in the ED and discharged home if possible.   Clinical Course as of Aug 22 135  Mon Aug 21, 2016  0134 In spite of 3 DuoNeb labs and Solu-Medrol in the emergency department after getting repeated albuterols every 4 hours at home, the patient is still retracting and wheezing audibly with coarse breath sounds throughout.  I have ordered magnesium 2 g IV, another albuterol treatment, and azithromycin 500 mg by mouth.  I discussed the case with the hospitalist  who will admit.  She does not need BiPAP at this time.  [CF]    Clinical Course User Index [CF] Hinda Kehr, MD    ____________________________________________  FINAL CLINICAL IMPRESSION(S) / ED DIAGNOSES  Final diagnoses:  Acute exacerbation of chronic obstructive pulmonary disease (COPD) ([place])  Viral URI with cough     MEDICATIONS GIVEN DURING THIS VISIT:  Medications  magnesium sulfate IVPB 2 g 50 mL (2 g Intravenous New Bag/Given 08/21/16 0110)  ipratropium-albuterol (DUONEB) 0.5-2.5 (3) MG/3ML nebulizer solution 3 mL (3 mLs Nebulization Given 08/20/16 2242)  ipratropium-albuterol (DUONEB) 0.5-2.5 (3) MG/3ML nebulizer solution 3 mL (3 mLs Nebulization Given 08/20/16 2242)  ipratropium-albuterol (DUONEB) 0.5-2.5 (3) MG/3ML nebulizer solution 3 mL (3 mLs Nebulization Given 08/20/16 2242)  sodium chloride 0.9 % bolus 1,000 mL (0 mLs Intravenous Stopped 08/21/16 0103)  methylPREDNISolone sodium succinate (SOLU-MEDROL) 125 mg/2 mL injection 125 mg (125 mg Intravenous Given 08/21/16 0007)  ipratropium-albuterol (DUONEB) 0.5-2.5 (3)  MG/3ML nebulizer solution 3 mL (3 mLs Nebulization Given 08/21/16 0006)  ipratropium-albuterol (DUONEB) 0.5-2.5 (3) MG/3ML nebulizer solution 3 mL (3 mLs Nebulization Given 08/21/16 0007)  ipratropium-albuterol (DUONEB) 0.5-2.5 (3) MG/3ML nebulizer solution 3 mL (3 mLs Nebulization Given 08/21/16 0007)  albuterol (PROVENTIL) (2.5 MG/3ML) 0.083% nebulizer solution 2.5 mg (2.5 mg Nebulization Given 08/21/16 0110)  azithromycin (ZITHROMAX) tablet 500 mg (500 mg Oral Given 08/21/16 0110)     NEW OUTPATIENT MEDICATIONS STARTED DURING THIS VISIT:  New Prescriptions   No medications on file    Modified Medications   No medications on file    Discontinued Medications   No medications on file     Note:  This document was prepared using Dragon voice recognition software and may include unintentional dictation errors.    Hinda Kehr, MD 08/21/16 [phone]

## 2016-08-21 NOTE — Evaluation (Signed)
Physical Therapy Evaluation Patient Details Name: [name] MRN: [id number] DOB: May 31, 1975 Today's Date: 08/21/2016   History of Present Illness  Pt is a 41 y/o F who presented with symptoms of upper respiratory infection with productive cough.  Pt admitted with acute exacerbation of COPD.  Pt's PMH includes COPD, home O2 dependent, alcohol abuse, tobacco abuse, back surgery.    Clinical Impression  Pt admitted with above diagnosis. Pt currently with functional limitations due to the deficits listed below (see PT Problem List). [name] presents with primarily pulmonary related impairments, limiting her ability to participate in exertional activity today.  She requires supervision for safety with transfers.  Unable to ambulate this session due to hypoxia. SpO2 91% on 2L O2 supine in bed, down to 88% on 2L O2 when sitting EOB with cues for upright posture and pursed lip breathing, down to 86% on 2L O2 with stand pivot transfer.  Back up to 89% on 2L O2 when sitting at end of session.   Pt independent as baseline and anticipate that once pt's pulmonary status improves she will progress with mobility quickly.  Recommending Pulmonary Rehab at d/c to address O2 levels and to help prevent recurrence of current episode.  Pt will benefit from skilled PT to increase their independence and safety with mobility to allow discharge to the venue listed below.      Follow Up Recommendations No PT follow up    Equipment Recommendations  None recommended by PT    Recommendations for Other Services Other (comment) (Pulmonary Rehab)     Precautions / Restrictions Precautions Precautions: Fall;Other (comment) Precaution Comments: Monitor O2 Restrictions Weight Bearing Restrictions: No      Mobility  Bed Mobility Overal bed mobility: Independent             General bed mobility comments: No physical assist or cues needed.  Pt performs independently.   Transfers Overall transfer  level: Needs assistance Equipment used: None Transfers: Sit to/from [employer] Sit to Stand: Supervision Stand pivot transfers: Supervision       General transfer comment: Supervision for safety with transfers.  No physical assist required.  Ambulation/Gait             General Gait Details: Not appropriate to attempt at this time.  Stairs            Wheelchair Mobility    Modified Rankin (Stroke Patients Only)       Balance Overall balance assessment: Modified Independent               Single Leg Stance - Right Leg: 5 (5 seconds BLE with SLS) Single Leg Stance - Left Leg: 5 (5 seconds BLE with SLS) Tandem Stance - Right Leg: 30 Tandem Stance - Left Leg: 30 [place] - Eyes Opened: 30 [place] - Eyes Closed: 6                 Pertinent Vitals/Pain Pain Assessment: 0-10 Pain Score: 8  Pain Location: chest on inspiration Pain Descriptors / Indicators: Aching;Discomfort Pain Intervention(s): Limited activity within patient's tolerance;Monitored during session;Repositioned;Other (comment);Patient requesting pain meds-RN notified (RN already aware)    Home Living Family/patient expects to be discharged to:: Private residence Living Arrangements: Alone Available Help at Discharge: Family;Friend(s);Available PRN/intermittently Type of Home: Apartment Home Access: Level entry     Home Layout: One level Home Equipment: None      Prior Function Level of Independence: Independent  Comments: Pt working full-time and additional part time at [employer].  No falls in the past 6 months.  Does not use AD to ambulate.       Hand Dominance        Extremity/Trunk Assessment   Upper Extremity Assessment Upper Extremity Assessment: Overall WFL for tasks assessed    Lower Extremity Assessment Lower Extremity Assessment: Overall WFL for tasks assessed       Communication   Communication: No difficulties  Cognition  Arousal/Alertness: Awake/alert Behavior During Therapy: WFL for tasks assessed/performed Overall Cognitive Status: Within Functional Limits for tasks assessed                                        General Comments General comments (skin integrity, edema, etc.): SpO2 91% on 2L O2 supine in bed, down to 88% on 2L O2 when sitting EOB with cues for upright posture and pursed lip breathing, down to 86% on 2L O2 with stand pivot transfer.  Back up to 89% on 2L O2 when sitting at end of session.      Exercises Other Exercises Other Exercises: Cues and demonstration for pursed lip breathing which pt has difficulty peforming correctly.   Other Exercises: Cues and demonstration for upright posture for improved breathing.  Encouraged pt to perform this throughout the day.   Assessment/Plan    PT Assessment Patient needs continued PT services  PT Problem List Cardiopulmonary status limiting activity;Pain;Decreased safety awareness;Decreased balance;Decreased activity tolerance       PT Treatment Interventions Gait training;Stair training;Functional mobility training;Therapeutic activities;Therapeutic exercise;Balance training;Neuromuscular re-education;Patient/family education    PT Goals (Current goals can be found in the Care Plan section)  Acute Rehab PT Goals Patient Stated Goal: to improve her breathing PT Goal Formulation: With patient Time For Goal Achievement: 09/04/16 Potential to Achieve Goals: Good    Frequency Min 2X/week   Barriers to discharge        Co-evaluation               AM-PAC PT "6 Clicks" Daily Activity  Outcome Measure Difficulty turning over in bed (including adjusting bedclothes, sheets and blankets)?: None Difficulty moving from lying on back to sitting on the side of the bed? : None Difficulty sitting down on and standing up from a chair with arms (e.g., wheelchair, bedside commode, etc,.)?: A Little Help needed moving to and from a  bed to chair (including a wheelchair)?: A Little Help needed walking in hospital room?: A Little Help needed climbing 3-5 steps with a railing? : A Little 6 Click Score: 20    End of Session Equipment Utilized During Treatment: Gait belt;Oxygen Activity Tolerance: Treatment limited secondary to medical complications (Comment) (hypoxia) Patient left: in chair;with call bell/phone within reach;with chair alarm set Nurse Communication: Mobility status;Other (comment) (SpO2) PT Visit Diagnosis: Unsteadiness on feet (R26.81)    Time: 3976-7341 PT Time Calculation (min) (ACUTE ONLY): 27 min   Charges:   PT Evaluation $PT Eval Low Complexity: 1 Procedure PT Treatments $Therapeutic Activity: 8-22 mins   PT G Codes:        Collie Siad PT, DPT 08/21/2016, 11:39 AM

## 2016-08-22 LAB — HIV ANTIBODY (ROUTINE TESTING W REFLEX): HIV Screen 4th Generation wRfx: NONREACTIVE

## 2016-08-22 MED ORDER — IPRATROPIUM-ALBUTEROL 0.5-2.5 (3) MG/3ML IN SOLN
3.0000 mL | RESPIRATORY_TRACT | Status: DC
  Administered 2016-08-22 – 2016-08-23 (×6): 3 mL via RESPIRATORY_TRACT
  Filled 2016-08-22 (×6): qty 3

## 2016-08-22 MED ORDER — AZITHROMYCIN 500 MG PO TABS
500.0000 mg | ORAL_TABLET | Freq: Every day | ORAL | 0 refills | Status: DC
Start: 2016-08-22 — End: 2016-08-23

## 2016-08-22 MED ORDER — PREDNISONE 10 MG (21) PO TBPK: ORAL_TABLET | ORAL | 0 refills | Status: DC

## 2016-08-22 NOTE — Consult Note (Signed)
Pulmonary Critical Care  Initial Consult Note   DEAISHA WELBORN ZOX:[id number] DOB: 1975/12/13 DOA: 08/20/2016  Referring physician: Dr Vianne Bulls PCP: Nat Christen, PA-C   Chief Complaint: COPD  HPI: ZEPHANIAH ENYEART is a 41 y.o. female wih history of COPD presented to the hospital with increased SOB> patient had chest pain also. She had had cough and congestion. Patient was noted to be significantly hypoxic. She was started on oxygen therapy and has shown some improvement since admission. Currently she is on azithromycin and also has been treated with IV steroids. Her oxygen which she has been on at home is down to 3lpm flow now with good sats.   Review of Systems:  Constitutional:  No weight loss, night sweats, Fevers, chills, fatigue.  HEENT:  No headaches, nasal congestion, post nasal drip,  Cardio-vascular:  +chest pain(on admission), no dizziness, palpitations  GI:  No heartburn, indigestion, abdominal pain, nausea, vomiting, diarrhea  Resp:  +shortness of breath(improving). +productive cough, No coughing up of blood.No wheezing Skin:  no rash or lesions.  Musculoskeletal:  No joint pain or swelling.   Remainder ROS performed and is unremarkable other than noted in HPI  Past Medical History:  Diagnosis Date  . Alcohol abuse   . Anemia 01/31/2011  . Asthma   . COPD (chronic obstructive pulmonary disease) ([place])   . Depression   . Hypoxia 02/01/2011  . Needs sleep apnea assessment 2013.07.31  . Tobacco abuse    Past Surgical History:  Procedure Laterality Date  . ANKLE GANGLION CYST EXCISION    . BACK SURGERY    . CESAREAN SECTION    . COLONOSCOPY WITH PROPOFOL N/A 06/27/2016   Procedure: COLONOSCOPY WITH PROPOFOL;  Surgeon: Jonathon Bellows, MD;  Location: [facility] ENDOSCOPY;  Service: Endoscopy;  Laterality: N/A;  . DILATION AND CURETTAGE OF UTERUS    . ESOPHAGOGASTRODUODENOSCOPY (EGD) WITH PROPOFOL N/A 06/27/2016   Procedure: ESOPHAGOGASTRODUODENOSCOPY (EGD) WITH  PROPOFOL;  Surgeon: Jonathon Bellows, MD;  Location: [facility] ENDOSCOPY;  Service: Endoscopy;  Laterality: N/A;   Social History:  reports that she has been smoking Cigarettes.  She has a 20.00 pack-year smoking history. She has never used smokeless tobacco. She reports that she drinks alcohol. She reports that she does not use drugs.  No Known Allergies  Family History  Problem Relation Age of Onset  . Hypothyroidism Mother   . Coronary artery disease Mother   . Throat cancer Mother   . Diabetes Maternal Grandmother     Prior to Admission medications   Medication Sig Start Date End Date Taking? Authorizing Provider  albuterol (PROVENTIL HFA;VENTOLIN HFA) 108 (90 Base) MCG/ACT inhaler Inhale 1-2 puffs into the lungs every 6 (six) hours as needed for wheezing or shortness of breath.   Yes [provider]  FLUoxetine (PROZAC) 40 MG capsule Take 40 mg by mouth daily. 08/05/16  Yes [provider]  Fluticasone-Salmeterol (ADVAIR) 250-50 MCG/DOSE AEPB Inhale 1 puff into the lungs 2 (two) times daily.   Yes [provider]  ipratropium-albuterol (DUONEB) 0.5-2.5 (3) MG/3ML SOLN Inhale 3 mLs into the lungs every 6 (six) hours as needed. 08/14/16  Yes [provider]  levothyroxine (SYNTHROID, LEVOTHROID) 125 MCG tablet Take 125 mcg by mouth daily before breakfast.   Yes [provider]  montelukast (SINGULAIR) 10 MG tablet Take 10 mg by mouth at bedtime.   Yes [provider]  ondansetron (ZOFRAN ODT) 4 MG disintegrating tablet Take 1 tablet (4 mg total) by mouth every 8 (eight)  hours as needed for nausea or vomiting. 05/07/16  Yes Eula Listen, MD  polyethylene glycol powder (GLYCOLAX/MIRALAX) powder Take 255 g by mouth daily. 06/06/16  Yes Jonathon Bellows, MD  albuterol (PROVENTIL) (2.5 MG/3ML) 0.083% nebulizer solution Take 3 mLs (2.5 mg total) by nebulization every 6 (six) hours as needed for wheezing or shortness of breath. For asthma Patient not  taking: Reported on 06/06/2016 02/01/11   Rexene Alberts, MD  azithromycin (ZITHROMAX) 500 MG tablet Take 1 tablet (500 mg total) by mouth daily. 08/22/16   Epifanio Lesches, MD  dicyclomine (BENTYL) 20 MG tablet Take 1 tablet (20 mg total) by mouth 3 (three) times daily as needed for spasms. Patient not taking: Reported on 08/21/2016 06/06/16 06/06/17  Jonathon Bellows, MD  levofloxacin (LEVAQUIN) 750 MG tablet Take 1 tablet (750 mg total) by mouth daily. Patient not taking: Reported on 06/06/2016 02/14/16   Epifanio Lesches, MD  omeprazole (PRILOSEC) 40 MG capsule Take 1 capsule (40 mg total) by mouth daily. Patient not taking: Reported on 06/27/2016 05/07/16 05/07/17  Eula Listen, MD  predniSONE (DELTASONE) 20 MG tablet Take 2 tablets (40 mg total) by mouth daily. Patient not taking: Reported on 06/06/2016 05/10/16 05/10/17  Orbie Pyo, MD  predniSONE (STERAPRED UNI-PAK 21 TAB) 10 MG (21) TBPK tablet Taper by 10 mg daily 08/22/16   Epifanio Lesches, MD  sucralfate (CARAFATE) 1 g tablet Take 1 tablet (1 g total) by mouth 4 (four) times daily. Patient not taking: Reported on 06/27/2016 05/10/16 05/10/17  Orbie Pyo, MD   Physical Exam: Vitals:   08/21/16 1606 08/21/16 2356 08/22/16 0542 08/22/16 0723  BP: 105/60 (!) 95/54  (!) 105/55  Pulse: 67 90  91  Resp: 18 16    Temp: 98.3 F (36.8 C) 98.1 F (36.7 C)  98 F (36.7 C)  TempSrc: Oral Oral  Oral  SpO2: 95% 93% 94% 94%  Weight:      Height:        Wt Readings from Last 3 Encounters:  08/21/16 86.6 kg (191 lb)  06/27/16 92.5 kg (204 lb)  06/06/16 92.1 kg (203 lb)    General:  Appears calm and comfortable Eyes: PERRL, normal lids, irises & conjunctiva ENT: grossly normal hearing, lips & tongue Neck: no LAD, masses or thyromegaly Cardiovascular: RRR, no m/r/g. No LE edema. Respiratory: scattered rhonchi otherwise CTA bilaterally, no w/r/r. Normal respiratory effort. Abdomen: soft, nontender Skin: no  rash or induration seen on limited exam Musculoskeletal: grossly normal tone BUE/BLE Psychiatric: grossly normal mood and affect Neurologic: grossly non-focal.          Labs on Admission:  Basic Metabolic Panel:  Recent Labs Lab 08/20/16 2232 08/21/16 0443  NA 134* 135  K 3.8 3.6  CL 104 107  CO2 23 20*  GLUCOSE 100* 135*  BUN 7 7  CREATININE 0.81 0.59  CALCIUM 9.4 8.7*   Liver Function Tests: No results for input(s): AST, ALT, ALKPHOS, BILITOT, PROT, ALBUMIN in the last 168 hours. No results for input(s): LIPASE, AMYLASE in the last 168 hours. No results for input(s): AMMONIA in the last 168 hours. CBC:  Recent Labs Lab 08/20/16 2232 08/21/16 0443  WBC 4.1 3.3*  HGB 11.9* 11.4*  HCT 36.7 34.9*  MCV 78.5* 78.9*  PLT 270 217   Cardiac Enzymes:  Recent Labs Lab 08/20/16 2232  TROPONINI <0.03    BNP (last 3 results) No results for input(s): BNP in the last 8760 hours.  ProBNP (last 3 results)  No results for input(s): PROBNP in the last 8760 hours.  CBG: No results for input(s): GLUCAP in the last 168 hours.  Radiological Exams on Admission: Dg Chest 2 View  Result Date: 08/20/2016 CLINICAL DATA:  41 year old female with acute onset shortness of breath. EXAM: CHEST  2 VIEW COMPARISON:  Chest radiograph dated 02/10/2016 FINDINGS: There are linear peribronchial densities predominantly involving the mid to lower lung fields. Focal area of hazy density at the right lung base, likely right middle lobe noted. These findings may be chronic, however developing pneumonia is not excluded. Clinical correlation is recommended. There is no pleural effusion or pneumothorax. The cardiac silhouette is within normal limits. No acute osseous pathology. IMPRESSION: Bilateral lower lung field interstitial and peribronchial densities, likely chronic. Developing pneumonia is not excluded. Clinical correlation is recommended. Electronically Signed   By: Anner Crete [name]   On:  08/20/2016 23:19    EKG: Independently reviewed.  Assessment/Plan Active Problems:   COPD exacerbation ([place])   1. Acute on chronic resp failure with hypoxia -clinically improving back to baseline -will continue with supportive care -f/u ABG as needed  2. COPD with exacerbation -she will be seen back in the office -Will do PFT as outpatient -continue with inhalers -slow wean on steroids  3. Oxygen dependence -slowly back to baseline   Code Status: full code DVT Prophylaxis:SQ Family Communication: none Disposition Plan: home  Time spent: 52min    I have personally obtained a history, examined the patient, evaluated laboratory and imaging results, formulated the assessment and plan and placed orders.  The Patient requires high complexity decision making for assessment and support.    Allyne Gee, MD [facility] Pulmonary Critical Care Medicine Sleep Medicine

## 2016-08-22 NOTE — Progress Notes (Addendum)
     [name] was admitted to the Hospital on 08/20/2016 and Discharged  08/23/2016 and should be excused from work/school   for 3days starting 08/20/2016 , may return to work/school ,advise light duty from tomorrow till7/13.  Epifanio Lesches M.D on 08/22/2016,at 10:08 AM

## 2016-08-22 NOTE — Progress Notes (Signed)
[facility] [place] NAME: [name]    MR#:  [id number]  DATE OF BIRTH:  1975/10/19  SUBJECTIVE: At the bedside. Admitted for COPD exacerbation and bronchitis. Has less wheezing today. On 3 L of oxygen.   CHIEF COMPLAINT:   Chief Complaint  Patient presents with  . Shortness of Breath  . Chest Pain    REVIEW OF SYSTEMS:   ROS CONSTITUTIONAL: No fever, fatigue or weakness.  EYES: No blurred or double vision.  EARS, NOSE, AND THROAT: No tinnitus or ear pain.  RESPIRATORY.Cough, shortness of breath, wheezing. CARDIOVASCULAR: No chest pain, orthopnea, edema.  GASTROINTESTINAL: No nausea, vomiting, diarrhea or abdominal pain.  GENITOURINARY: No dysuria, hematuria.  ENDOCRINE: No polyuria, nocturia,  HEMATOLOGY: No anemia, easy bruising or bleeding SKIN: No rash or lesion. MUSCULOSKELETAL: No joint pain or arthritis.   NEUROLOGIC: No tingling, numbness, weakness.  PSYCHIATRY: No anxiety or depression.   DRUG ALLERGIES:  No Known Allergies  VITALS:  Blood pressure (!) 105/55, pulse 91, temperature 98 F (36.7 C), temperature source Oral, resp. rate 16, height 5\' 5"  (1.651 m), weight 86.6 kg (191 lb), last menstrual period 08/13/2016, SpO2 94 %.  PHYSICAL EXAMINATION:  GENERAL:  41 y.o.-year-old patient lying in the bed with no acute distress.  EYES: Pupils equal, round, reactive to light and accommodation. No scleral icterus. Extraocular muscles intact.  HEENT: Head atraumatic, normocephalic. Oropharynx and nasopharynx clear.  NECK:  Supple, no jugular venous distention. No thyroid enlargement, no tenderness.  LUNGS: Bilateral expiratory wheeze in all lung fields. CARDIOVASCULAR: S1, S2 normal. No murmurs, rubs, or gallops.  ABDOMEN: Soft, nontender, nondistended. Bowel sounds present. No organomegaly or mass.  EXTREMITIES: No pedal edema, cyanosis, or clubbing.  NEUROLOGIC: Cranial nerves II through XII are intact.  Muscle strength 5/5 in all extremities. Sensation intact. Gait not checked.  PSYCHIATRIC: The patient is alert and oriented x 3.  SKIN: No obvious rash, lesion, or ulcer.    LABORATORY PANEL:   CBC  Recent Labs Lab 08/21/16 0443  WBC 3.3*  HGB 11.4*  HCT 34.9*  PLT 217   ------------------------------------------------------------------------------------------------------------------  Chemistries   Recent Labs Lab 08/21/16 0443  NA 135  K 3.6  CL 107  CO2 20*  GLUCOSE 135*  BUN 7  CREATININE 0.59  CALCIUM 8.7*   ------------------------------------------------------------------------------------------------------------------  Cardiac Enzymes  Recent Labs Lab 08/20/16 2232  TROPONINI <0.03   ------------------------------------------------------------------------------------------------------------------  RADIOLOGY:  Dg Chest 2 View  Result Date: 08/20/2016 CLINICAL DATA:  41 year old female with acute onset shortness of breath. EXAM: CHEST  2 VIEW COMPARISON:  Chest radiograph dated 02/10/2016 FINDINGS: There are linear peribronchial densities predominantly involving the mid to lower lung fields. Focal area of hazy density at the right lung base, likely right middle lobe noted. These findings may be chronic, however developing pneumonia is not excluded. Clinical correlation is recommended. There is no pleural effusion or pneumothorax. The cardiac silhouette is within normal limits. No acute osseous pathology. IMPRESSION: Bilateral lower lung field interstitial and peribronchial densities, likely chronic. Developing pneumonia is not excluded. Clinical correlation is recommended. Electronically Signed   By: Anner Crete [name]   On: 08/20/2016 23:19    EKG:   Orders placed or performed during the hospital encounter of 08/20/16  . ED EKG  . ED EKG  . EKG 12-Lead  . EKG 12-Lead  . EKG 12-Lead    ASSESSMENT AND PLAN:   Acute  chronic respiratory failure  secondary  to COPD exacerbation. Use oxygen only when needed but now she is using continuously and she is on 3 L of oxygen, saturation 94%. Continue oxygen, bronchodilators, IV steroids, empiric antibiotics, likely discharge home tomorrow with tapering course of prednisone.    All the records are reviewed and case discussed with Care Management/Social Workerr. Management plans discussed with the patient, family and they are in agreement.  CODE STATUS: full  TOTAL TIME TAKING CARE OF THIS PATIENT:65minutes.   POSSIBLE D/C IN 1-2DAYS, DEPENDING ON CLINICAL CONDITION.   Epifanio Lesches M.D on 08/22/2016 at 10:40 AM  Between 7am to 6pm - Pager - [phone]  After 6pm go to [email or web address] - password [email or web address]  [facility]  Office  [phone]  CC: Primary care physician; Nat Christen, PA-C   Note: This dictation was prepared with Dragon dictation along with smaller phrase technology. Any transcriptional errors that result from this process are unintentional.

## 2016-08-22 NOTE — Progress Notes (Signed)
Pt got up and went to the bathroom without calling, bed alarm went off. Pt assisted back on the bed, got upset about the bed alarm. Pt educated about the significance of bed alarm for safety. Despite education, pt still insisted and stated aggressively, "I want that bed alarm off!" Again, pt reminded about safety precautions but pt stated "I don't care, I'm going home tomorrow". Bed alarm turned off per pt's wish.

## 2016-08-22 NOTE — Progress Notes (Signed)
PT Cancellation Note  Patient Details Name: [name] MRN: [id number] DOB: 1975/09/15   Cancelled Treatment:    Reason Eval/Treat Not Completed: Other (comment)   Pt offered and encouraged therapy this PM. She stated she was not feeling well and her sats were low.  Obtained monitor and pt 95% with O2 support.  Pt continued to decline therapy session stating she would get up and washed when her mom came in.  Encouraged out of bed as able.   [name] 08/22/2016, 1:15 PM

## 2016-08-22 NOTE — Progress Notes (Signed)
Took over care for patient at 1100. Received report from [facility], Therapist, sports. O2 at 2L, chronic. Reviewed POC.

## 2016-08-23 MED ORDER — AZITHROMYCIN 500 MG PO TABS: 500.0000 mg | ORAL_TABLET | Freq: Every day | ORAL | 0 refills | Status: DC

## 2016-08-23 MED ORDER — IPRATROPIUM-ALBUTEROL 0.5-2.5 (3) MG/3ML IN SOLN: 3.0000 mL | Freq: Four times a day (QID) | RESPIRATORY_TRACT | Status: DC

## 2016-08-23 MED ORDER — PREDNISONE 10 MG (21) PO TBPK: ORAL_TABLET | ORAL | 0 refills | Status: DC

## 2016-08-23 NOTE — Progress Notes (Signed)
Patient is being discharged home with scripts, work excuss, and last dose of meds given. Son brought O2 tank. Tubing sent with patient. IV removed with cath intact. Allowed time for questions.

## 2016-08-23 NOTE — Progress Notes (Signed)
     [name] was admitted to the Hospital on 08/20/2016 and Discharged  08/23/2016 and should be excused from work/school   for  8 days starting 08/20/2016 , may return to work/school without any restrictions.    Epifanio Lesches M.D on 08/23/2016,at 11:36 AM

## 2016-08-24 NOTE — Discharge Summary (Signed)
[name], is a 41 y.o. female  DOB 03-20-1975  MRN [id number].  Admission date:  08/20/2016  Admitting Physician  Harvie Bridge, DO  Discharge Date:  7/112018   Primary MD  Nat Christen, PA-C  Recommendations for primary care physician for things to follow:   Follow-up with PCP in 1 week   Admission Diagnosis  Acute exacerbation of chronic obstructive pulmonary disease (COPD) (HCC) [J44.1] Viral URI with cough [J06.9, B97.89]   Discharge Diagnosis  Acute exacerbation of chronic obstructive pulmonary disease (COPD) (HCC) [J44.1] Viral URI with cough [J06.9, B97.89]    Active Problems:   COPD exacerbation (HCC)      Past Medical History:  Diagnosis Date  . Alcohol abuse   . Anemia 01/31/2011  . Asthma   . COPD (chronic obstructive pulmonary disease) ([place])   . Depression   . Hypoxia 02/01/2011  . Needs sleep apnea assessment 2013.07.31  . Tobacco abuse     Past Surgical History:  Procedure Laterality Date  . ANKLE GANGLION CYST EXCISION    . BACK SURGERY    . CESAREAN SECTION    . COLONOSCOPY WITH PROPOFOL N/A 06/27/2016   Procedure: COLONOSCOPY WITH PROPOFOL;  Surgeon: Jonathon Bellows, MD;  Location: [facility] ENDOSCOPY;  Service: Endoscopy;  Laterality: N/A;  . DILATION AND CURETTAGE OF UTERUS    . ESOPHAGOGASTRODUODENOSCOPY (EGD) WITH PROPOFOL N/A 06/27/2016   Procedure: ESOPHAGOGASTRODUODENOSCOPY (EGD) WITH PROPOFOL;  Surgeon: Jonathon Bellows, MD;  Location: [facility] ENDOSCOPY;  Service: Endoscopy;  Laterality: N/A;       History of present illness and  Hospital Course:     Kindly see H&P for history of present illness and admission details, please review complete Labs, Consult reports and Test reports for all details in brief  HPI  from the history and physical done on the day of admission 41 year old  female patient admitted for shortness of breath, found to have COPD exacerbation. Patient has oxygen 2 L at home and she is [place] when she needs. But for the past few day she is using it every day all the time.   Hospital Course  1 acute on chronic respiratory failure with hypoxia due to COPD exacerbation without pneumonia: Failed outpatient therapy, follows up with Dr. Heidi Dach. He had audible wheezing, labored respirations and the triad in the emergency room. Her tachycardia up to 1 18 bpm in the emergency room, hypoxia with oxygen saturation 90% on 2 L also. Admitted for hospitalist service with COPD exacerbation, started on IV steroids, bronchodilators, empiric antibiotics, seen by Dr.Sadaat Humphrey Rolls. also. Patient felt better with IV steroids, bronchodilators and antibiotics, stable for chart so we discharged her home with the azithromycin, tapering course of steroid. Follow up with Dr. Bea Laura as outpatient for pulmonary function testing. She uses long-acting steroids at home. Lives at home, I'll screen the patient to use nebulizer every 4 hours for the next 2 days. Provided work note for her work, she works at 2 places  One work note for  [employer] another work note one for  maintenance .  #2. oxygen dependence #3. Depression;  uses Prozac 40 MG daily. Hypothyroidism #5 GERD  Discharge Condition: stable   Follow UP  Follow-up Information    Cleaster Corin, Lucky Rathke, PA-C Follow up on 08/29/2016.   Specialty:  Physician Assistant Why:  @ 8:40 am Contact information: [place] [address] [phone]             Discharge Instructions  and  Discharge Medications     Allergies as of 08/23/2016   No Known Allergies     Medication List    STOP taking these medications   levofloxacin 750 MG tablet Commonly known as:  LEVAQUIN   predniSONE 20 MG tablet Commonly known as:  DELTASONE Replaced by:  predniSONE 10 MG (21) Tbpk tablet     TAKE these  medications   albuterol 108 (90 Base) MCG/ACT inhaler Commonly known as:  PROVENTIL HFA;VENTOLIN HFA Inhale 1-2 puffs into the lungs every 6 (six) hours as needed for wheezing or shortness of breath.   albuterol (2.5 MG/3ML) 0.083% nebulizer solution Commonly known as:  PROVENTIL Take 3 mLs (2.5 mg total) by nebulization every 6 (six) hours as needed for wheezing or shortness of breath. For asthma   azithromycin 500 MG tablet Commonly known as:  ZITHROMAX Take 1 tablet (500 mg total) by mouth daily.   dicyclomine 20 MG tablet Commonly known as:  BENTYL Take 1 tablet (20 mg total) by mouth 3 (three) times daily as needed for spasms.   FLUoxetine 40 MG capsule Commonly known as:  PROZAC Take 40 mg by mouth daily.   Fluticasone-Salmeterol 250-50 MCG/DOSE Aepb Commonly known as:  ADVAIR Inhale 1 puff into the lungs 2 (two) times daily.   ipratropium-albuterol 0.5-2.5 (3) MG/3ML Soln Commonly known as:  DUONEB Inhale 3 mLs into the lungs every 6 (six) hours as needed.   levothyroxine 125 MCG tablet Commonly known as:  SYNTHROID, LEVOTHROID Take 125 mcg by mouth daily before breakfast.   montelukast 10 MG tablet Commonly known as:  SINGULAIR Take 10 mg by mouth at bedtime.   omeprazole 40 MG capsule Commonly known as:  PRILOSEC Take 1 capsule (40 mg total) by mouth daily.   ondansetron 4 MG disintegrating tablet Commonly known as:  ZOFRAN ODT Take 1 tablet (4 mg total) by mouth every 8 (eight) hours as needed for nausea or vomiting.   polyethylene glycol powder powder Commonly known as:  GLYCOLAX/MIRALAX Take 255 g by mouth daily.   predniSONE 10 MG (21) Tbpk tablet Commonly known as:  STERAPRED UNI-PAK 21 TAB Taper by 10 mg daily Replaces:  predniSONE 20 MG tablet   sucralfate 1 g tablet Commonly known as:  CARAFATE Take 1 tablet (1 g total) by mouth 4 (four) times daily.         Diet and Activity recommendation: See Discharge Instructions above   Consults  obtained - pulmonary   Major procedures and Radiology Reports - PLEASE review detailed and final reports for all details, in brief -      Dg Chest 2 View  Result Date: 08/20/2016 CLINICAL DATA:  41 year old female with acute onset shortness of breath. EXAM: CHEST  2 VIEW COMPARISON:  Chest radiograph dated 02/10/2016 FINDINGS: There are linear peribronchial densities predominantly involving the mid to lower lung fields. Focal area of hazy density at the right lung base, likely right middle lobe noted. These findings may be chronic, however developing pneumonia is not excluded. Clinical correlation is recommended. There is no pleural effusion or pneumothorax. The cardiac silhouette is within normal limits. No acute osseous pathology. IMPRESSION: Bilateral lower lung field interstitial and peribronchial densities, likely chronic. Developing pneumonia is not excluded. Clinical correlation is recommended. Electronically Signed   By: Anner Crete [name]   On: 08/20/2016 23:19    Micro Results    No results found for this or any previous visit (from the past 240 hour(s)).  Today   Subjective:   [name] today has no headache,no chest abdominal pain,no new weakness tingling or numbness, feels much better wants to go home today.   Objective:   Blood pressure (!) 115/58, pulse 68, temperature 98.8 F (37.1 C), temperature source Oral, resp. rate 18, height 5\' 5"  (1.651 m), weight 86.6 kg (191 lb), last menstrual period 08/13/2016, SpO2 95 %.  No intake or output data in the 24 hours ending 08/24/16 1624  Exam Awake Alert, Oriented x 3, No new F.N deficits, Normal affect [place].AT,PERRAL Supple Neck,No JVD, No cervical lymphadenopathy appriciated.  Symmetrical Chest wall movement, Good air movement bilaterally, CTAB RRR,No Gallops,Rubs or new Murmurs, No Parasternal Heave +ve B.Sounds, Abd Soft, Non tender, No organomegaly appriciated, No rebound -guarding or rigidity. No  Cyanosis, Clubbing or edema, No new Rash or bruise  Data Review   CBC w Diff:  Lab Results  Component Value Date   WBC 3.3 (L) 08/21/2016   HGB 11.4 (L) 08/21/2016   HGB 11.6 (L) 05/13/2013   HCT 34.9 (L) 08/21/2016   HCT 36.2 05/13/2013   PLT 217 08/21/2016   PLT 264 05/13/2013   LYMPHOPCT 20.3 05/13/2013   MONOPCT 7.2 05/13/2013   EOSPCT 0.0 05/13/2013   BASOPCT 0.1 05/13/2013    CMP:  Lab Results  Component Value Date   NA 135 08/21/2016   NA 136 05/11/2013   K 3.6 08/21/2016   K 4.1 05/11/2013   CL 107 08/21/2016   CL 106 05/11/2013   CO2 20 (L) 08/21/2016   CO2 27 05/11/2013   BUN 7 08/21/2016   BUN 12 05/11/2013   CREATININE 0.59 08/21/2016   CREATININE 0.71 05/15/2013   PROT 7.2 05/10/2016   PROT 7.7 05/10/2013   ALBUMIN 4.1 05/10/2016   ALBUMIN 3.8 05/10/2013   BILITOT 0.6 05/10/2016   BILITOT 0.2 05/10/2013   ALKPHOS 58 05/10/2016   ALKPHOS 95 05/10/2013   AST 20 05/10/2016   AST 8 (L) 05/10/2013   ALT 16 05/10/2016   ALT 17 05/10/2013  .   Total Time in preparing paper work, data evaluation and todays exam - 69 minutes  Charo Philipp M.D on 08/23/2016 at 4:24 PM    Note: This dictation was prepared with Dragon dictation along with smaller phrase technology. Any transcriptional errors that result from this process are unintentional.

## 2016-12-05 ENCOUNTER — Encounter (HOSPITAL_COMMUNITY): Payer: Self-pay | Admitting: Emergency Medicine

## 2016-12-05 ENCOUNTER — Inpatient Hospital Stay (HOSPITAL_COMMUNITY)
Admission: EM | Admit: 2016-12-05 | Discharge: 2016-12-07 | DRG: 190 | Disposition: A | Discharge status: Home / Self Care | Payer: BLUE CROSS/BLUE SHIELD | Attending: Internal Medicine | Admitting: Internal Medicine

## 2016-12-05 ENCOUNTER — Emergency Department (HOSPITAL_COMMUNITY): Payer: BLUE CROSS/BLUE SHIELD

## 2016-12-05 DIAGNOSIS — J209 Acute bronchitis, unspecified: Secondary | ICD-10-CM

## 2016-12-05 DIAGNOSIS — Z808 Family history of malignant neoplasm of other organs or systems: Secondary | ICD-10-CM

## 2016-12-05 DIAGNOSIS — B348 Other viral infections of unspecified site: Secondary | ICD-10-CM

## 2016-12-05 DIAGNOSIS — J440 Chronic obstructive pulmonary disease with acute lower respiratory infection: Secondary | ICD-10-CM | POA: Diagnosis present

## 2016-12-05 DIAGNOSIS — R739 Hyperglycemia, unspecified: Secondary | ICD-10-CM | POA: Diagnosis present

## 2016-12-05 DIAGNOSIS — D72829 Elevated white blood cell count, unspecified: Secondary | ICD-10-CM

## 2016-12-05 DIAGNOSIS — F17210 Nicotine dependence, cigarettes, uncomplicated: Secondary | ICD-10-CM | POA: Diagnosis present

## 2016-12-05 DIAGNOSIS — J441 Chronic obstructive pulmonary disease with (acute) exacerbation: Principal | ICD-10-CM | POA: Diagnosis present

## 2016-12-05 DIAGNOSIS — T380X5A Adverse effect of glucocorticoids and synthetic analogues, initial encounter: Secondary | ICD-10-CM | POA: Diagnosis present

## 2016-12-05 DIAGNOSIS — B9789 Other viral agents as the cause of diseases classified elsewhere: Secondary | ICD-10-CM | POA: Diagnosis present

## 2016-12-05 DIAGNOSIS — J9621 Acute and chronic respiratory failure with hypoxia: Secondary | ICD-10-CM | POA: Diagnosis present

## 2016-12-05 DIAGNOSIS — Z23 Encounter for immunization: Secondary | ICD-10-CM

## 2016-12-05 DIAGNOSIS — Z833 Family history of diabetes mellitus: Secondary | ICD-10-CM

## 2016-12-05 DIAGNOSIS — F329 Major depressive disorder, single episode, unspecified: Secondary | ICD-10-CM | POA: Diagnosis present

## 2016-12-05 DIAGNOSIS — E876 Hypokalemia: Secondary | ICD-10-CM | POA: Diagnosis present

## 2016-12-05 DIAGNOSIS — D649 Anemia, unspecified: Secondary | ICD-10-CM | POA: Diagnosis present

## 2016-12-05 DIAGNOSIS — Z8249 Family history of ischemic heart disease and other diseases of the circulatory system: Secondary | ICD-10-CM

## 2016-12-05 DIAGNOSIS — R0602 Shortness of breath: Secondary | ICD-10-CM | POA: Diagnosis not present

## 2016-12-05 DIAGNOSIS — Z720 Tobacco use: Secondary | ICD-10-CM | POA: Diagnosis present

## 2016-12-05 DIAGNOSIS — E039 Hypothyroidism, unspecified: Secondary | ICD-10-CM | POA: Diagnosis present

## 2016-12-05 LAB — CBC WITH DIFFERENTIAL/PLATELET
BASOS ABS: 0 10*3/uL (ref 0.0–0.1)
Basophils Relative: 0 %
Eosinophils Absolute: 0.2 10*3/uL (ref 0.0–0.7)
Eosinophils Relative: 4 %
HEMATOCRIT: 35.8 % — AB (ref 36.0–46.0)
HEMOGLOBIN: 10.9 g/dL — AB (ref 12.0–15.0)
LYMPHS PCT: 43 %
Lymphs Abs: 2.6 10*3/uL (ref 0.7–4.0)
MCH: 25.4 pg — ABNORMAL LOW (ref 26.0–34.0)
MCHC: 30.4 g/dL (ref 30.0–36.0)
MCV: 83.4 fL (ref 78.0–100.0)
MONO ABS: 0.4 10*3/uL (ref 0.1–1.0)
MONOS PCT: 7 %
NEUTROS ABS: 2.7 10*3/uL (ref 1.7–7.7)
NEUTROS PCT: 46 %
Platelets: 251 10*3/uL (ref 150–400)
RBC: 4.29 MIL/uL (ref 3.87–5.11)
RDW: 15.4 % (ref 11.5–15.5)
WBC: 5.9 10*3/uL (ref 4.0–10.5)

## 2016-12-05 LAB — BASIC METABOLIC PANEL
ANION GAP: 11 (ref 5–15)
BUN: 10 mg/dL (ref 6–20)
CO2: 17 mmol/L — AB (ref 22–32)
Calcium: 8.9 mg/dL (ref 8.9–10.3)
Chloride: 108 mmol/L (ref 101–111)
Creatinine, Ser: 0.73 mg/dL (ref 0.44–1.00)
GFR calc Af Amer: >60 mL/min (ref >60)
GLUCOSE: 81 mg/dL (ref 65–99)
POTASSIUM: 3.6 mmol/L (ref 3.5–5.1)
Sodium: 136 mmol/L (ref 135–145)

## 2016-12-05 LAB — TROPONIN I: Troponin I: <0.03 ng/mL (ref <0.03)

## 2016-12-05 MED ORDER — PREDNISONE 20 MG PO TABS
60.0000 mg | ORAL_TABLET | Freq: Once | ORAL | Status: AC
  Administered 2016-12-05: 60 mg via ORAL
  Filled 2016-12-05: qty 3

## 2016-12-05 MED ORDER — ALBUTEROL (5 MG/ML) CONTINUOUS INHALATION SOLN
10.0000 mg/h | INHALATION_SOLUTION | Freq: Once | RESPIRATORY_TRACT | Status: AC
  Administered 2016-12-05: 10 mg/h via RESPIRATORY_TRACT
  Filled 2016-12-05: qty 20

## 2016-12-05 MED ORDER — SODIUM CHLORIDE 0.9 % IV BOLUS (SEPSIS)
1000.0000 mL | Freq: Once | INTRAVENOUS | Status: AC
  Administered 2016-12-05: 1000 mL via INTRAVENOUS

## 2016-12-05 MED ORDER — ALBUTEROL SULFATE (2.5 MG/3ML) 0.083% IN NEBU
5.0000 mg | INHALATION_SOLUTION | Freq: Once | RESPIRATORY_TRACT | Status: AC
  Administered 2016-12-05: 5 mg via RESPIRATORY_TRACT

## 2016-12-05 MED ORDER — ALBUTEROL SULFATE (2.5 MG/3ML) 0.083% IN NEBU
INHALATION_SOLUTION | RESPIRATORY_TRACT | Status: AC
  Filled 2016-12-05: qty 6

## 2016-12-05 NOTE — ED Triage Notes (Signed)
Patient arrives with 2 days of cough, sore throat. Today began to feel short of breath. Hx COPD. Used nebulizer @ 1800 with minimal improvement. Wheezing audible from hallway.

## 2016-12-05 NOTE — ED Provider Notes (Signed)
[facility] EMERGENCY DEPARTMENT Provider Note   CSN: [id number] Arrival date & time: 12/05/16  1953     History   Chief Complaint Chief Complaint  Patient presents with  . Wheezing  . Shortness of Breath  . Cough    HPI [name] is a 41 y.o. female.  She complains of shortness of breath with cough, nonproductive, for 2 days.  Waxing and waning.  She denies fever, chills, or vomiting.  Her chest feels sore from coughing.  She has felt somewhat lightheaded when walking.  She continues to smoke cigarettes but is trying to stop.  She is using her home medicines without relief.  There are no other known modifying factors.  HPI  Past Medical History:  Diagnosis Date  . Alcohol abuse   . Anemia 01/31/2011  . Asthma   . COPD (chronic obstructive pulmonary disease) ([place])   . Depression   . Hypoxia 02/01/2011  . Needs sleep apnea assessment 2013.07.31  . Tobacco abuse     Patient Active Problem List   Diagnosis Date Noted  . Hypokalemia 02/10/2016  . Hypothyroidism 02/10/2016  . CAP (community acquired pneumonia) 01/19/2013  . Dehydration 01/19/2013  . Nausea vomiting and diarrhea 01/19/2013  . Alcohol dependence ([place]) 01/16/2012  . Alcohol withdrawal ([place]) 01/16/2012  . Hypoxia 02/01/2011  . Hyponatremia 02/01/2011  . Anemia 01/31/2011  . Chest pain, pleuritic 01/29/2011  . Back pain 01/29/2011  . HCAP (healthcare-associated pneumonia) 01/28/2011  . Acute bronchitis 01/08/2011  . COPD exacerbation ([place]) 01/08/2011  . Tobacco abuse 01/08/2011  . Polysubstance abuse ([place]) 01/08/2011  . Major depression 01/08/2011    Past Surgical History:  Procedure Laterality Date  . ANKLE GANGLION CYST EXCISION    . BACK SURGERY    . CESAREAN SECTION    . COLONOSCOPY WITH PROPOFOL N/A 06/27/2016   Procedure: COLONOSCOPY WITH PROPOFOL;  Surgeon: Jonathon Bellows, MD;  Location: [facility] ENDOSCOPY;  Service: Endoscopy;  Laterality: N/A;  . DILATION AND  CURETTAGE OF UTERUS    . ESOPHAGOGASTRODUODENOSCOPY (EGD) WITH PROPOFOL N/A 06/27/2016   Procedure: ESOPHAGOGASTRODUODENOSCOPY (EGD) WITH PROPOFOL;  Surgeon: Jonathon Bellows, MD;  Location: [facility] ENDOSCOPY;  Service: Endoscopy;  Laterality: N/A;    OB History    No data available       Home Medications    Prior to Admission medications   Medication Sig Start Date End Date Taking? Authorizing Provider  albuterol (PROVENTIL HFA;VENTOLIN HFA) 108 (90 Base) MCG/ACT inhaler Inhale 1-2 puffs into the lungs every 6 (six) hours as needed for wheezing or shortness of breath.   Yes [provider]  FLUoxetine (PROZAC) 40 MG capsule Take 40 mg by mouth daily. 08/05/16  Yes [provider]  Fluticasone-Salmeterol (ADVAIR) 500-50 MCG/DOSE AEPB Inhale 2 puffs into the lungs 2 (two) times daily.   Yes [provider]  ipratropium-albuterol (DUONEB) 0.5-2.5 (3) MG/3ML SOLN Inhale 3 mLs into the lungs every 6 (six) hours as needed (for breathing).  08/14/16  Yes [provider]  levothyroxine (SYNTHROID, LEVOTHROID) 137 MCG tablet Take 137 mcg by mouth daily. 11/22/16  Yes [provider]  montelukast (SINGULAIR) 10 MG tablet Take 10 mg by mouth at bedtime.   Yes [provider]  varenicline (CHANTIX) 1 MG tablet Take 1 mg by mouth daily.   Yes [provider]  albuterol (PROVENTIL) (2.5 MG/3ML) 0.083% nebulizer solution Take 3 mLs (2.5 mg total) by nebulization every 6 (six) hours as needed for wheezing or shortness  of breath. For asthma Patient not taking: Reported on 06/06/2016 02/01/11   Rexene Alberts, MD  azithromycin (ZITHROMAX) 500 MG tablet Take 1 tablet (500 mg total) by mouth daily. Patient not taking: Reported on 12/05/2016 08/23/16   Epifanio Lesches, MD  dicyclomine (BENTYL) 20 MG tablet Take 1 tablet (20 mg total) by mouth 3 (three) times daily as needed for spasms. Patient not taking: Reported on 08/21/2016 06/06/16 06/06/17  Jonathon Bellows,  MD  omeprazole (PRILOSEC) 40 MG capsule Take 1 capsule (40 mg total) by mouth daily. Patient not taking: Reported on 06/27/2016 05/07/16 05/07/17  Eula Listen, MD  ondansetron (ZOFRAN ODT) 4 MG disintegrating tablet Take 1 tablet (4 mg total) by mouth every 8 (eight) hours as needed for nausea or vomiting. Patient not taking: Reported on 12/05/2016 05/07/16   Eula Listen, MD  polyethylene glycol powder (GLYCOLAX/MIRALAX) powder Take 255 g by mouth daily. Patient not taking: Reported on 12/05/2016 06/06/16   Jonathon Bellows, MD  predniSONE (STERAPRED UNI-PAK 21 TAB) 10 MG (21) TBPK tablet Taper by 10 mg daily Patient not taking: Reported on 12/05/2016 08/23/16   Epifanio Lesches, MD  sucralfate (CARAFATE) 1 g tablet Take 1 tablet (1 g total) by mouth 4 (four) times daily. Patient not taking: Reported on 06/27/2016 05/10/16 05/10/17  Schaevitz, Randall An, MD    Family History Family History  Problem Relation Age of Onset  . Hypothyroidism Mother   . Coronary artery disease Mother   . Throat cancer Mother   . Diabetes Maternal Grandmother     Social History Social History  Substance Use Topics  . Smoking status: Current Every Day Smoker    Packs/day: 1.00    Years: 20.00    Types: Cigarettes    Last attempt to quit: 06/19/2012  . Smokeless tobacco: Never Used  . Alcohol use 0.0 oz/week     Comment: occ     Allergies   Patient has no known allergies.   Review of Systems Review of Systems  All other systems reviewed and are negative.    Physical Exam Updated Vital Signs BP 113/65   Pulse 71   Temp 98.3 F (36.8 C) (Oral)   Resp 15   Ht 5\' 3"  (1.6 m)   Wt 84.4 kg (186 lb)   LMP 11/27/2016 (Exact Date)   SpO2 100%   BMI 32.95 kg/m   Physical Exam  Constitutional: She is oriented to person, place, and time. She appears well-developed. No distress.  She appears older than stated age.  HENT:  Head: Normocephalic and atraumatic.  Eyes: Pupils are  equal, round, and reactive to light. Conjunctivae and EOM are normal.  Neck: Normal range of motion and phonation normal. Neck supple.  Cardiovascular: Normal rate and regular rhythm.   Pulmonary/Chest: Effort normal. No respiratory distress. She exhibits no tenderness.  Decreased air movement bilaterally with generalized wheezing.  There is no increased work of breathing.  Abdominal: Soft. She exhibits no distension. There is no tenderness. There is no guarding.  Musculoskeletal: Normal range of motion.  Neurological: She is alert and oriented to person, place, and time. She exhibits normal muscle tone.  Skin: Skin is warm and dry.  Psychiatric: She has a normal mood and affect. Her behavior is normal. Judgment and thought content normal.  Nursing note and vitals reviewed.    ED Treatments / Results  Labs (all labs ordered are listed, but only abnormal results are displayed) Labs Reviewed  BASIC METABOLIC PANEL - Abnormal; Notable for  the following:       Result Value   CO2 17 (*)    All other components within normal limits  CBC WITH DIFFERENTIAL/PLATELET - Abnormal; Notable for the following:    Hemoglobin 10.9 (*)    HCT 35.8 (*)    MCH 25.4 (*)    All other components within normal limits  TROPONIN I   Hemoglobin  Date Value Ref Range Status  12/05/2016 10.9 (L) 12.0 - 15.0 g/dL Final  08/21/2016 11.4 (L) 12.0 - 16.0 g/dL Final  08/20/2016 11.9 (L) 12.0 - 16.0 g/dL Final  05/10/2016 11.6 (L) 12.0 - 16.0 g/dL Final   HGB  Date Value Ref Range Status  05/13/2013 11.6 (L) 12.0 - 16.0 g/dL Final  05/11/2013 11.8 (L) 12.0 - 16.0 g/dL Final  05/10/2013 13.5 12.0 - 16.0 g/dL Final  05/05/2013 12.3 12.0 - 16.0 g/dL Final     EKG  EKG Interpretation  Date/Time:  Tuesday December 05 2016 22:06:46 EDT Ventricular Rate:  79 PR Interval:    QRS Duration: 105 QT Interval:  409 QTC Calculation: 469 R Axis:   37 Text Interpretation:  Sinus rhythm Since last tracing rate  slower Confirmed by Daleen Bo [phone]) on 12/05/2016 10:44:05 PM       Radiology Dg Chest 2 View  Result Date: 12/05/2016 CLINICAL DATA:  Acute onset of cough and sore throat. Shortness of breath. Wheezing. Initial encounter. EXAM: CHEST  2 VIEW COMPARISON:  Chest radiograph performed 08/20/2016 FINDINGS: The lungs are well-aerated. Minimal bilateral atelectasis is noted. There is no evidence of pleural effusion or pneumothorax. The heart is normal in size; the mediastinal contour is within normal limits. No acute osseous abnormalities are seen. IMPRESSION: Minimal bilateral atelectasis noted.  Lungs otherwise clear. Electronically Signed   By: Garald Balding [name]   On: 12/05/2016 21:21    Procedures Procedures (including critical care time)  Medications Ordered in ED Medications  albuterol (PROVENTIL) (2.5 MG/3ML) 0.083% nebulizer solution 5 mg (5 mg Nebulization Given 12/05/16 2020)  sodium chloride 0.9 % bolus 1,000 mL (1,000 mLs Intravenous New Bag/Given 12/05/16 2215)  albuterol (PROVENTIL,VENTOLIN) solution continuous neb (10 mg/hr Nebulization Given 12/05/16 2217)  predniSONE (DELTASONE) tablet 60 mg (60 mg Oral Given 12/05/16 2232)     Initial Impression / Assessment and Plan / ED Course  I have reviewed the triage vital signs and the nursing notes.  Pertinent labs & imaging results that were available during my care of the patient were reviewed by me and considered in my medical decision making (see chart for details).  Clinical Course as of Dec 07 2  Wed Dec 06, 2016  0003 Normal Troponin I: <0.03 [EW]  0003 Normal WBC: 5.9 [EW]  0003 Low Hemoglobin: (!) 10.9 [EW]    Clinical Course User Index [EW] Daleen Bo, MD     Patient Vitals for the past 24 hrs:  BP Temp Temp src Pulse Resp SpO2 Height Weight  12/05/16 2230 113/65 - - 71 15 100 % - -  12/05/16 2217 - - - - - 94 % - -  12/05/16 2215 114/72 - - 82 18 92 % - -  12/05/16 2200 105/79 - - 80 20 94 %  - -  12/05/16 2145 116/67 - - 88 13 93 % - -  12/05/16 2141 102/72 - - 82 20 95 % - -  12/05/16 2013 110/76 98.3 F (36.8 C) Oral 94 20 95 % 5\' 3"  (1.6 m) 84.4 kg (186 lb)  12:00 AM Reevaluation with update and discussion. After initial assessment and treatment, an updated evaluation reveals patient is still uncomfortable, with shortness of breath, and oxygen saturation 88% on room air.  With deep breathing oxygen saturation does not improve.  For nebulizer ordered, to improve oxygen saturation, and will consider disposition home or admit.Daleen Bo L      Final Clinical Impressions(s) / ED Diagnoses   Final diagnoses:  Acute bronchitis, unspecified organism    Evaluation consistent with bronchitis.  Doubt pneumonia, ACS or PE.  Nursing Notes Reviewed/ Care Coordinated Applicable Imaging Reviewed Interpretation of Laboratory Data incorporated into ED treatment  Care to oncoming provider team to evaluate after nebulizer treatment, and reassess oxygenation.  New Prescriptions New Prescriptions   No medications on file     Daleen Bo, MD 12/06/16 [phone]

## 2016-12-06 ENCOUNTER — Encounter (HOSPITAL_COMMUNITY): Payer: Self-pay | Admitting: Family Medicine

## 2016-12-06 DIAGNOSIS — F17210 Nicotine dependence, cigarettes, uncomplicated: Secondary | ICD-10-CM | POA: Diagnosis present

## 2016-12-06 DIAGNOSIS — J9621 Acute and chronic respiratory failure with hypoxia: Secondary | ICD-10-CM

## 2016-12-06 DIAGNOSIS — J441 Chronic obstructive pulmonary disease with (acute) exacerbation: Secondary | ICD-10-CM | POA: Diagnosis present

## 2016-12-06 DIAGNOSIS — F329 Major depressive disorder, single episode, unspecified: Secondary | ICD-10-CM | POA: Diagnosis present

## 2016-12-06 DIAGNOSIS — B348 Other viral infections of unspecified site: Secondary | ICD-10-CM | POA: Diagnosis not present

## 2016-12-06 DIAGNOSIS — B9789 Other viral agents as the cause of diseases classified elsewhere: Secondary | ICD-10-CM | POA: Diagnosis present

## 2016-12-06 DIAGNOSIS — D649 Anemia, unspecified: Secondary | ICD-10-CM

## 2016-12-06 DIAGNOSIS — R0602 Shortness of breath: Secondary | ICD-10-CM | POA: Diagnosis present

## 2016-12-06 DIAGNOSIS — E876 Hypokalemia: Secondary | ICD-10-CM | POA: Diagnosis present

## 2016-12-06 DIAGNOSIS — T380X5A Adverse effect of glucocorticoids and synthetic analogues, initial encounter: Secondary | ICD-10-CM | POA: Diagnosis present

## 2016-12-06 DIAGNOSIS — Z808 Family history of malignant neoplasm of other organs or systems: Secondary | ICD-10-CM | POA: Diagnosis not present

## 2016-12-06 DIAGNOSIS — Z8249 Family history of ischemic heart disease and other diseases of the circulatory system: Secondary | ICD-10-CM | POA: Diagnosis not present

## 2016-12-06 DIAGNOSIS — E039 Hypothyroidism, unspecified: Secondary | ICD-10-CM

## 2016-12-06 DIAGNOSIS — J209 Acute bronchitis, unspecified: Secondary | ICD-10-CM | POA: Diagnosis present

## 2016-12-06 DIAGNOSIS — D72829 Elevated white blood cell count, unspecified: Secondary | ICD-10-CM | POA: Diagnosis not present

## 2016-12-06 DIAGNOSIS — Z720 Tobacco use: Secondary | ICD-10-CM | POA: Diagnosis not present

## 2016-12-06 DIAGNOSIS — Z23 Encounter for immunization: Secondary | ICD-10-CM | POA: Diagnosis not present

## 2016-12-06 DIAGNOSIS — J440 Chronic obstructive pulmonary disease with acute lower respiratory infection: Secondary | ICD-10-CM | POA: Diagnosis present

## 2016-12-06 DIAGNOSIS — Z833 Family history of diabetes mellitus: Secondary | ICD-10-CM | POA: Diagnosis not present

## 2016-12-06 DIAGNOSIS — R739 Hyperglycemia, unspecified: Secondary | ICD-10-CM | POA: Diagnosis present

## 2016-12-06 LAB — BASIC METABOLIC PANEL
Anion gap: 12 (ref 5–15)
BUN: 8 mg/dL (ref 6–20)
CHLORIDE: 106 mmol/L (ref 101–111)
CO2: 19 mmol/L — ABNORMAL LOW (ref 22–32)
CREATININE: 0.7 mg/dL (ref 0.44–1.00)
Calcium: 8.7 mg/dL — ABNORMAL LOW (ref 8.9–10.3)
Glucose, Bld: 160 mg/dL — ABNORMAL HIGH (ref 65–99)
POTASSIUM: 3.3 mmol/L — AB (ref 3.5–5.1)
SODIUM: 137 mmol/L (ref 135–145)

## 2016-12-06 LAB — RESPIRATORY PANEL BY PCR
ADENOVIRUS-RVPPCR: NOT DETECTED
Bordetella pertussis: NOT DETECTED
Chlamydophila pneumoniae: NOT DETECTED
Coronavirus 229E: NOT DETECTED
Coronavirus HKU1: NOT DETECTED
Coronavirus NL63: NOT DETECTED
Coronavirus OC43: NOT DETECTED
INFLUENZA A-RVPPCR: NOT DETECTED
Influenza B: NOT DETECTED
METAPNEUMOVIRUS-RVPPCR: NOT DETECTED
Mycoplasma pneumoniae: NOT DETECTED
PARAINFLUENZA VIRUS 2-RVPPCR: NOT DETECTED
PARAINFLUENZA VIRUS 3-RVPPCR: NOT DETECTED
PARAINFLUENZA VIRUS 4-RVPPCR: NOT DETECTED
Parainfluenza Virus 1: NOT DETECTED
RHINOVIRUS / ENTEROVIRUS - RVPPCR: DETECTED — AB
Respiratory Syncytial Virus: NOT DETECTED

## 2016-12-06 LAB — CBC WITH DIFFERENTIAL/PLATELET
BASOS ABS: 0 10*3/uL (ref 0.0–0.1)
Basophils Relative: 0 %
EOS ABS: 0 10*3/uL (ref 0.0–0.7)
EOS PCT: 0 %
HCT: 33.9 % — ABNORMAL LOW (ref 36.0–46.0)
HEMOGLOBIN: 10.3 g/dL — AB (ref 12.0–15.0)
LYMPHS ABS: 0.5 10*3/uL — AB (ref 0.7–4.0)
LYMPHS PCT: 10 %
MCH: 25.4 pg — AB (ref 26.0–34.0)
MCHC: 30.4 g/dL (ref 30.0–36.0)
MCV: 83.5 fL (ref 78.0–100.0)
Monocytes Absolute: 0.1 10*3/uL (ref 0.1–1.0)
Monocytes Relative: 2 %
NEUTROS PCT: 88 %
Neutro Abs: 3.9 10*3/uL (ref 1.7–7.7)
PLATELETS: 231 10*3/uL (ref 150–400)
RBC: 4.06 MIL/uL (ref 3.87–5.11)
RDW: 15.3 % (ref 11.5–15.5)
WBC: 4.5 10*3/uL (ref 4.0–10.5)

## 2016-12-06 LAB — INFLUENZA PANEL BY PCR (TYPE A & B)
Influenza A By PCR: NEGATIVE
Influenza B By PCR: NEGATIVE

## 2016-12-06 MED ORDER — ENOXAPARIN SODIUM 40 MG/0.4ML ~~LOC~~ SOLN
40.0000 mg | SUBCUTANEOUS | Status: DC
  Administered 2016-12-06: 40 mg via SUBCUTANEOUS
  Filled 2016-12-06: qty 0.4

## 2016-12-06 MED ORDER — METHYLPREDNISOLONE SODIUM SUCC 125 MG IJ SOLR
60.0000 mg | Freq: Four times a day (QID) | INTRAMUSCULAR | Status: DC
  Administered 2016-12-06 – 2016-12-07 (×6): 60 mg via INTRAVENOUS
  Filled 2016-12-06 (×6): qty 2

## 2016-12-06 MED ORDER — VARENICLINE TARTRATE 1 MG PO TABS
1.0000 mg | ORAL_TABLET | Freq: Every day | ORAL | Status: DC
  Administered 2016-12-06 – 2016-12-07 (×2): 1 mg via ORAL
  Filled 2016-12-06 (×2): qty 1

## 2016-12-06 MED ORDER — ALBUTEROL (5 MG/ML) CONTINUOUS INHALATION SOLN
10.0000 mg/h | INHALATION_SOLUTION | Freq: Once | RESPIRATORY_TRACT | Status: AC
  Administered 2016-12-06: 10 mg/h via RESPIRATORY_TRACT

## 2016-12-06 MED ORDER — ONDANSETRON HCL 4 MG PO TABS: 4.0000 mg | ORAL_TABLET | Freq: Four times a day (QID) | ORAL | Status: DC | PRN

## 2016-12-06 MED ORDER — DEXTROSE 5 % IV SOLN
500.0000 mg | INTRAVENOUS | Status: DC
  Administered 2016-12-06 – 2016-12-07 (×2): 500 mg via INTRAVENOUS
  Filled 2016-12-06 (×2): qty 500

## 2016-12-06 MED ORDER — ACETAMINOPHEN 650 MG RE SUPP: 650.0000 mg | Freq: Four times a day (QID) | RECTAL | Status: DC | PRN

## 2016-12-06 MED ORDER — ACETAMINOPHEN 325 MG PO TABS
650.0000 mg | ORAL_TABLET | Freq: Four times a day (QID) | ORAL | Status: DC | PRN
  Administered 2016-12-06: 650 mg via ORAL
  Filled 2016-12-06: qty 2

## 2016-12-06 MED ORDER — BISACODYL 5 MG PO TBEC: 5.0000 mg | DELAYED_RELEASE_TABLET | Freq: Every day | ORAL | Status: DC | PRN

## 2016-12-06 MED ORDER — FLUOXETINE HCL 20 MG PO CAPS
40.0000 mg | ORAL_CAPSULE | Freq: Every day | ORAL | Status: DC
  Administered 2016-12-06 – 2016-12-07 (×2): 40 mg via ORAL
  Filled 2016-12-06 (×2): qty 2

## 2016-12-06 MED ORDER — MOMETASONE FURO-FORMOTEROL FUM 200-5 MCG/ACT IN AERO
2.0000 | INHALATION_SPRAY | Freq: Two times a day (BID) | RESPIRATORY_TRACT | Status: DC
  Administered 2016-12-06 – 2016-12-07 (×3): 2 via RESPIRATORY_TRACT
  Filled 2016-12-06: qty 8.8

## 2016-12-06 MED ORDER — INFLUENZA VAC SPLIT QUAD 0.5 ML IM SUSY
0.5000 mL | PREFILLED_SYRINGE | INTRAMUSCULAR | Status: AC
  Administered 2016-12-07: 0.5 mL via INTRAMUSCULAR
  Filled 2016-12-06: qty 0.5

## 2016-12-06 MED ORDER — GUAIFENESIN ER 600 MG PO TB12
1200.0000 mg | ORAL_TABLET | Freq: Two times a day (BID) | ORAL | Status: DC
  Administered 2016-12-06 – 2016-12-07 (×3): 1200 mg via ORAL
  Filled 2016-12-06 (×3): qty 2

## 2016-12-06 MED ORDER — MONTELUKAST SODIUM 10 MG PO TABS
10.0000 mg | ORAL_TABLET | Freq: Every day | ORAL | Status: DC
  Administered 2016-12-06: 10 mg via ORAL
  Filled 2016-12-06: qty 1

## 2016-12-06 MED ORDER — HYDROCODONE-ACETAMINOPHEN 5-325 MG PO TABS: 1.0000 | ORAL_TABLET | ORAL | Status: DC | PRN

## 2016-12-06 MED ORDER — SENNOSIDES-DOCUSATE SODIUM 8.6-50 MG PO TABS: 1.0000 | ORAL_TABLET | Freq: Every evening | ORAL | Status: DC | PRN

## 2016-12-06 MED ORDER — IPRATROPIUM-ALBUTEROL 0.5-2.5 (3) MG/3ML IN SOLN
3.0000 mL | Freq: Four times a day (QID) | RESPIRATORY_TRACT | Status: DC
  Administered 2016-12-06 – 2016-12-07 (×5): 3 mL via RESPIRATORY_TRACT
  Filled 2016-12-06 (×5): qty 3

## 2016-12-06 MED ORDER — LEVOTHYROXINE SODIUM 137 MCG PO TABS
137.0000 ug | ORAL_TABLET | Freq: Every day | ORAL | Status: DC
  Administered 2016-12-06 – 2016-12-07 (×2): 137 ug via ORAL
  Filled 2016-12-06 (×2): qty 1

## 2016-12-06 MED ORDER — ONDANSETRON HCL 4 MG/2ML IJ SOLN: 4.0000 mg | Freq: Four times a day (QID) | INTRAMUSCULAR | Status: DC | PRN

## 2016-12-06 MED ORDER — ALBUTEROL SULFATE (2.5 MG/3ML) 0.083% IN NEBU: 2.5000 mg | INHALATION_SOLUTION | RESPIRATORY_TRACT | Status: DC | PRN

## 2016-12-06 NOTE — H&P (Signed)
History and Physical    [name] KKX:[id number] DOB: [birth year] DOA: 12/05/2016  PCP: Nat Christen, PA-C   Patient coming from: Home  Chief Complaint: SOB, wheezing, rhinorrhea, sore throat, aches   HPI: [name] is a 41 y.o. female with medical history significant for COPD, depression, and chronic anemia, now presenting to the emergency department for evaluation of cough, wheezing, rhinorrhea, and sore throat.  Patient reports that she been in her usual state until approximately 2 days ago when she developed copious clear rhinorrhea and sore throat.  She also began to have body aches at that time.  She had been exposed to a housemate with similar symptoms.  Over the past 24 hours, she has developed rapidly progressive dyspnea and wheezing.  She used her home neb with only fleeting relief.  Denies lower extremity swelling or tenderness and denies fevers or chills.  No chest pain or palpitations.  ED Course: Upon arrival to the ED, patient is found to be afebrile, requiring 3 L/min to maintain O2 saturations in the low 90s while cardiac, and with vitals otherwise stable.  EKG features a normal sinus rhythm and chest x-ray to assess, but otherwise clear.  Chemistry panel is unremarkable and CBC is notable for stable normocytic anemia with hemoglobin of 10.9.  Troponin is undetectable.  Patient was given 1 L of normal saline, 60 mg prednisone, 10 mg continuous albuterol treatment x2, and continues to be dyspneic while at rest.  She will be admitted to the medical/surgical unit for ongoing evaluation and management of COPD exacerbation, likely precipitated by acute viral URI.  Review of Systems:  All other systems reviewed and apart from HPI, are negative.  Past Medical History:  Diagnosis Date  . Alcohol abuse   . Anemia 01/31/2011  . Asthma   . COPD (chronic obstructive pulmonary disease) ([place])   . Depression   . Hypoxia 02/01/2011  . Needs sleep apnea assessment  2013.07.31  . Tobacco abuse     Past Surgical History:  Procedure Laterality Date  . ANKLE GANGLION CYST EXCISION    . BACK SURGERY    . CESAREAN SECTION    . COLONOSCOPY WITH PROPOFOL N/A 06/27/2016   Procedure: COLONOSCOPY WITH PROPOFOL;  Surgeon: Jonathon Bellows, MD;  Location: [facility] ENDOSCOPY;  Service: Endoscopy;  Laterality: N/A;  . DILATION AND CURETTAGE OF UTERUS    . ESOPHAGOGASTRODUODENOSCOPY (EGD) WITH PROPOFOL N/A 06/27/2016   Procedure: ESOPHAGOGASTRODUODENOSCOPY (EGD) WITH PROPOFOL;  Surgeon: Jonathon Bellows, MD;  Location[facility] ENDOSCOPY;  Service: Endoscopy;  Laterality: N/A;     reports that she has been smoking Cigarettes.  She has a 20.00 pack-year smoking history. She has never used smokeless tobacco. She reports that she drinks alcohol. She reports that she does not use drugs.  No Known Allergies  Family History  Problem Relation Age of Onset  . Hypothyroidism Mother   . Coronary artery disease Mother   . Throat cancer Mother   . Diabetes Maternal Grandmother      Prior to Admission medications   Medication Sig Start Date End Date Taking? Authorizing Provider  albuterol (PROVENTIL HFA;VENTOLIN HFA) 108 (90 Base) MCG/ACT inhaler Inhale 1-2 puffs into the lungs every 6 (six) hours as needed for wheezing or shortness of breath.   Yes [provider]  FLUoxetine (PROZAC) 40 MG capsule Take 40 mg by mouth daily. 08/05/16  Yes [provider]  Fluticasone-Salmeterol (ADVAIR) 500-50 MCG/DOSE AEPB Inhale 2 puffs into the lungs 2 (two) times daily.  Yes [provider]  ipratropium-albuterol (DUONEB) 0.5-2.5 (3) MG/3ML SOLN Inhale 3 mLs into the lungs every 6 (six) hours as needed (for breathing).  08/14/16  Yes [provider]  levothyroxine (SYNTHROID, LEVOTHROID) 137 MCG tablet Take 137 mcg by mouth daily. 11/22/16  Yes [provider]  montelukast (SINGULAIR) 10 MG tablet Take 10 mg by mouth at bedtime.   Yes [provider]   varenicline (CHANTIX) 1 MG tablet Take 1 mg by mouth daily.   Yes [provider]  albuterol (PROVENTIL) (2.5 MG/3ML) 0.083% nebulizer solution Take 3 mLs (2.5 mg total) by nebulization every 6 (six) hours as needed for wheezing or shortness of breath. For asthma Patient not taking: Reported on 06/06/2016 02/01/11   Rexene Alberts, MD    Physical Exam: Vitals:   12/06/16 0122 12/06/16 0130 12/06/16 0245 12/06/16 0300  BP:  121/73 119/65 120/65  Pulse:  82 (!) 106 97  Resp:  19 15 13   Temp:      TempSrc:      SpO2: 92% 97% 92% 93%  Weight:      Height:          Constitutional: NAD, calm, in obvious discomfort.  Eyes: PERTLA, lids and conjunctivae normal ENMT: Mucous membranes are moist. Posterior pharynx clear of any exudate or lesions.   Neck: normal, supple, no masses, no thyromegaly Respiratory: Diminished bilaterally, prolonged expiratory phase, wheezes. No accessory muscle use. No pallor.  Cardiovascular: S1 & S2 heard, regular rate and rhythm. No extremity edema. No significant JVD. Abdomen: No distension, no tenderness, no masses palpated. Bowel sounds normal.  Musculoskeletal: no clubbing / cyanosis. No joint deformity upper and lower extremities.   Skin: no significant rashes, lesions, ulcers. Warm, dry, well-perfused. Neurologic: CN 2-12 grossly intact. Sensation intact. Strength 5/5 in all 4 limbs.  Psychiatric: Alert and oriented x 3. Labile emotions, cooperative.     Labs on Admission: I have personally reviewed following labs and imaging studies  CBC:  Recent Labs Lab 12/05/16 2213  WBC 5.9  NEUTROABS 2.7  HGB 10.9*  HCT 35.8*  MCV 83.4  PLT 081   Basic Metabolic Panel:  Recent Labs Lab 12/05/16 2213  NA 136  K 3.6  CL 108  CO2 17*  GLUCOSE 81  BUN 10  CREATININE 0.73  CALCIUM 8.9   GFR: Estimated Creatinine Clearance: 94.3 mL/min (by C-G formula based on SCr of 0.73 mg/dL). Liver Function Tests: No results for input(s): AST,  ALT, ALKPHOS, BILITOT, PROT, ALBUMIN in the last 168 hours. No results for input(s): LIPASE, AMYLASE in the last 168 hours. No results for input(s): AMMONIA in the last 168 hours. Coagulation Profile: No results for input(s): INR, PROTIME in the last 168 hours. Cardiac Enzymes:  Recent Labs Lab 12/05/16 2213  TROPONINI <0.03   BNP (last 3 results) No results for input(s): PROBNP in the last 8760 hours. HbA1C: No results for input(s): HGBA1C in the last 72 hours. CBG: No results for input(s): GLUCAP in the last 168 hours. Lipid Profile: No results for input(s): CHOL, HDL, LDLCALC, TRIG, CHOLHDL, LDLDIRECT in the last 72 hours. Thyroid Function Tests: No results for input(s): TSH, T4TOTAL, FREET4, T3FREE, THYROIDAB in the last 72 hours. Anemia Panel: No results for input(s): VITAMINB12, FOLATE, FERRITIN, TIBC, IRON, RETICCTPCT in the last 72 hours. Urine analysis:    Component Value Date/Time   COLORURINE STRAW (A) 05/10/2016 1227   APPEARANCEUR CLEAR (A) 05/10/2016 1227   APPEARANCEUR Hazy 05/10/2013 1216  LABSPEC 1.001 (L) 05/10/2016 1227   LABSPEC 1.014 05/10/2013 1216   PHURINE 8.0 05/10/2016 1227   GLUCOSEU NEGATIVE 05/10/2016 1227   GLUCOSEU Negative 05/10/2013 1216   HGBUR SMALL (A) 05/10/2016 1227   BILIRUBINUR NEGATIVE 05/10/2016 1227   BILIRUBINUR Negative 05/10/2013 1216   KETONESUR NEGATIVE 05/10/2016 1227   PROTEINUR NEGATIVE 05/10/2016 1227   UROBILINOGEN 0.2 09/10/2011 0250   NITRITE NEGATIVE 05/10/2016 1227   LEUKOCYTESUR NEGATIVE 05/10/2016 1227   LEUKOCYTESUR Negative 05/10/2013 1216   Sepsis Labs: @LABRCNTIP (procalcitonin:4,lacticidven:4) )No results found for this or any previous visit (from the past 240 hour(s)).   Radiological Exams on Admission: Dg Chest 2 View  Result Date: 12/05/2016 CLINICAL DATA:  Acute onset of cough and sore throat. Shortness of breath. Wheezing. Initial encounter. EXAM: CHEST  2 VIEW COMPARISON:  Chest radiograph  performed 08/20/2016 FINDINGS: The lungs are well-aerated. Minimal bilateral atelectasis is noted. There is no evidence of pleural effusion or pneumothorax. The heart is normal in size; the mediastinal contour is within normal limits. No acute osseous abnormalities are seen. IMPRESSION: Minimal bilateral atelectasis noted.  Lungs otherwise clear. Electronically Signed   By: Garald Balding [name]   On: 12/05/2016 21:21    EKG: Independently reviewed. Sinus rhythm.   Assessment/Plan  1. COPD with acute exacerbation - Pt presents with acute respiratory distress and hypoxia on rm air  - CXR negative for conspicuous infiltrate  - Suspect COPD exacerbation precipitated by acute viral URI  - She was treated in ED with prednisone, 10 mg continuous albuterol neb x2, and suplemental O2, but continuous to be symptomatic and hypoxic at rest  - Plan to continue supplemental O2, check sputum culture, increase systemic steroid, schedule DuoNeb, continue prn albuterol nebs, start azithromycin    2. Acute URI  - Pt reports copious rhinorrhea, sore throat, body aches for past 2 days after exposure to house mate with similar  - Check respiratory virus panel, maintain droplet precautions for now    3. Anemia  - Hgb is 10.9 on admission, consistent with baseline, no bleeding identified    4. Hypothyroidism  - Appears to be stable - Continue Synthroid    DVT prophylaxis: Lovenox Code Status: Full  Family Communication: Discussed with patient Disposition Plan: Admit to med-surg Consults called: None Admission status: Inpatient    Vianne Bulls, MD [facility] Pager [phone]  If 7PM-7AM, please contact night-coverage [email or web address] Password [email or web address]  12/06/2016, 3:16 AM

## 2016-12-06 NOTE — Progress Notes (Signed)
The patient was admitted early this AM after midnight and H and P has been reviewed and I am in current agreement with the Assessment and Plan done by Dr. Mitzi Hansen. The patient is a 41 yo Female with a PMH significant for COPD, Depression, Chronic Anemia and other comorbids who presented with SOB, wheezing, rhinorrhea, and worsening aches. She was admitted with a COPD Exacerbation and Acute URI. Additional changes to the plan of care have been made including ordering an Influenza A/B PCR, and repleting K+. Will repeat a CXR in AM and monitor patient's clinical response to interventions as well as repeat blood work in AM.

## 2016-12-06 NOTE — ED Notes (Signed)
Attempted to call report x 1 to 2 [place].

## 2016-12-06 NOTE — Progress Notes (Signed)
Nutrition Brief Note  Dietitian Consult received for COPD Gold, Assessment.   Wt Readings from Last 15 Encounters:  12/06/16 186 lb 3.2 oz (84.5 kg)  08/21/16 191 lb (86.6 kg)  06/27/16 204 lb (92.5 kg)  06/06/16 203 lb (92.1 kg)  05/10/16 218 lb (98.9 kg)  05/07/16 218 lb (98.9 kg)  02/10/16 215 lb (97.5 kg)  11/10/14 210 lb (95.3 kg)  06/24/14 204 lb (92.5 kg)  01/19/13 201 lb 1 oz (91.2 kg)  06/29/12 214 lb (97.1 kg)  11/10/11 190 lb (86.2 kg)  09/10/11 170 lb (77.1 kg)  05/27/11 212 lb (96.2 kg)  05/01/11 214 lb 8.1 oz (97.3 kg)   Pt verbalizes no changes in weight recently. Pt with 2.6% wt loss since July of this year per weight encounters; weight loss not significant for time frame.   Body mass index is 32.98 kg/m. Patient meets criteria for obesity unspecified based on current BMI.   Current diet order is Regular  patient is consuming approximately 100% of meals at this time. Pt reports good appetite at  Home as well. Labs and medications reviewed.   No nutrition interventions warranted at this time. If nutrition issues arise, please consult RD.   Kerman Passey MS, RD, LDN [phone] Pager  [phone] Weekend/On-Call Pager

## 2016-12-06 NOTE — Evaluation (Signed)
Physical Therapy Evaluation Patient Details Name: [name] MRN: [id number] DOB: 10/04/1975 Today's Date: 12/06/2016   History of Present Illness  Pt is a 41 y/o female admitted secondary to worsening SOB and cough. Imaging showed bilat atelectasis. PMH includes COPD, smoker, alcohol abuse, depression, and s/p back surgery.   Clinical Impression  Pt admitted secondary to problem above and deficits below. PTA, pt was independent with ambulation. Upon eval, pt presenting with unsteadiness during gait. Oxygen sats also decreased to 88% on 3L upon sitting, however, returned to 90% on 3L with cues for pursed lip breathing. Required min guard for mobility with one instance of min A secondary to LOB. Reports family will be able to assist as needed upon d/c. Feel pt will progress well and will not need any follow up PT services. Will continue to follow acutely to maximize functional mobility independence and safety.     Follow Up Recommendations No PT follow up;Supervision - Intermittent    Equipment Recommendations  None recommended by PT    Recommendations for Other Services       Precautions / Restrictions Precautions Precautions: Other (comment) Precaution Comments: watch oxygen sats Restrictions Weight Bearing Restrictions: No      Mobility  Bed Mobility Overal bed mobility: Modified Independent             General bed mobility comments: Increased time, but no external assist required. Oxygen sats decreased to 88% on 3L in sitting, but   Transfers Overall transfer level: Needs assistance Equipment used: None Transfers: Sit to/from Stand Sit to Stand: Min guard         General transfer comment: min guard for safety.   Ambulation/Gait Ambulation/Gait assistance: Min guard;Min assist Ambulation Distance (Feet): 40 Feet Assistive device: None Gait Pattern/deviations: Step-through pattern;Decreased stride length Gait velocity: Decreased Gait velocity  interpretation: Below normal speed for age/gender General Gait Details: Slow, overall steady gait, however, pt with one LOB and required min A for steadying assist. Oxygen sats at 90% on 3L of oxygen.   Stairs            Wheelchair Mobility    Modified Rankin (Stroke Patients Only)       Balance Overall balance assessment: Needs assistance Sitting-balance support: Feet supported;No upper extremity supported Sitting balance-Leahy Scale: Good     Standing balance support: No upper extremity supported;During functional activity Standing balance-Leahy Scale: Fair                               Pertinent Vitals/Pain Pain Assessment: 0-10 Pain Score: 4  Pain Location: L ribs and headache  Pain Descriptors / Indicators: Aching Pain Intervention(s): Monitored during session;Limited activity within patient's tolerance;Repositioned    Home Living Family/patient expects to be discharged to:: Private residence Living Arrangements: Children;Spouse/significant other Available Help at Discharge: Family;Available 24 hours/day Type of Home: Apartment Home Access: Level entry     Home Layout: One level Home Equipment: Shower seat - built in;Other (comment) (oxygen ) Additional Comments: Pt reports she is on oxygen at night and has it for when she needs it.     Prior Function Level of Independence: Independent         Comments: Driving, still working      Hand Dominance   Dominant Hand: Right    Extremity/Trunk Assessment   Upper Extremity Assessment Upper Extremity Assessment: Overall WFL for tasks assessed    Lower Extremity Assessment Lower Extremity  Assessment: Overall WFL for tasks assessed    Cervical / Trunk Assessment Cervical / Trunk Assessment: Normal  Communication   Communication: No difficulties  Cognition Arousal/Alertness: Awake/alert Behavior During Therapy: WFL for tasks assessed/performed Overall Cognitive Status: Within Functional  Limits for tasks assessed                                        General Comments General comments (skin integrity, edema, etc.): Discussed energy conservation techniques and activity pacing with pt.     Exercises     Assessment/Plan    PT Assessment Patient needs continued PT services  PT Problem List Decreased balance;Cardiopulmonary status limiting activity;Decreased mobility       PT Treatment Interventions Gait training;Functional mobility training;Therapeutic activities;Therapeutic exercise;Balance training;Neuromuscular re-education;Patient/family education    PT Goals (Current goals can be found in the Care Plan section)  Acute Rehab PT Goals Patient Stated Goal: to go home tomorrow  PT Goal Formulation: With patient Time For Goal Achievement: 12/13/16 Potential to Achieve Goals: Good    Frequency Min 3X/week   Barriers to discharge        Co-evaluation               AM-PAC PT "6 Clicks" Daily Activity  Outcome Measure Difficulty turning over in bed (including adjusting bedclothes, sheets and blankets)?: None Difficulty moving from lying on back to sitting on the side of the bed? : None Difficulty sitting down on and standing up from a chair with arms (e.g., wheelchair, bedside commode, etc,.)?: Unable Help needed moving to and from a bed to chair (including a wheelchair)?: A Little Help needed walking in hospital room?: A Little Help needed climbing 3-5 steps with a railing? : A Little 6 Click Score: 18    End of Session Equipment Utilized During Treatment: Oxygen Activity Tolerance: Patient tolerated treatment well Patient left: in bed;with call bell/phone within reach;Other (comment) (respiratory therapy in the room) Nurse Communication: Mobility status PT Visit Diagnosis: Unsteadiness on feet (R26.81)    Time: 3875-6433 PT Time Calculation (min) (ACUTE ONLY): 14 min   Charges:   PT Evaluation $PT Eval Low Complexity: 1 Low      PT G Codes:        Leighton Ruff, PT, DPT  Acute Rehabilitation Services  Pager: [phone]   Rudean Hitt 12/06/2016, 1:13 PM

## 2016-12-06 NOTE — ED Notes (Signed)
Patient ambulated without difficulty to restroom. Gait slow, steady. Wearing mask for droplet precautions.

## 2016-12-06 NOTE — ED Provider Notes (Signed)
Patient was signed out to me to follow-up after nebulizer therapy.  Patient has a history of COPD.  She does use oxygen as needed at home.  She has had a 2-day history of worsening cough.  Patient had significant bronchospasm present on arrival.  She worsens with her first albuterol treatment.  Patient transiently became hypoxic.  She is requiring supplemental oxygen currently.  She has had 2 continuous 1 hour nebulizer therapy treatments and still has significant wheezing and increased work of breathing.  She will require hospitalization.   Orpah Greek, MD 12/06/16 [phone]

## 2016-12-07 DIAGNOSIS — E876 Hypokalemia: Secondary | ICD-10-CM

## 2016-12-07 DIAGNOSIS — D72829 Elevated white blood cell count, unspecified: Secondary | ICD-10-CM

## 2016-12-07 DIAGNOSIS — B348 Other viral infections of unspecified site: Secondary | ICD-10-CM

## 2016-12-07 DIAGNOSIS — Z720 Tobacco use: Secondary | ICD-10-CM

## 2016-12-07 LAB — COMPREHENSIVE METABOLIC PANEL
ALK PHOS: 56 U/L (ref 38–126)
ALT: 14 U/L (ref 14–54)
ANION GAP: 8 (ref 5–15)
AST: 14 U/L — ABNORMAL LOW (ref 15–41)
Albumin: 3.7 g/dL (ref 3.5–5.0)
BILIRUBIN TOTAL: 0.5 mg/dL (ref 0.3–1.2)
BUN: 8 mg/dL (ref 6–20)
CALCIUM: 9.1 mg/dL (ref 8.9–10.3)
CO2: 22 mmol/L (ref 22–32)
CREATININE: 0.61 mg/dL (ref 0.44–1.00)
Chloride: 108 mmol/L (ref 101–111)
Glucose, Bld: 134 mg/dL — ABNORMAL HIGH (ref 65–99)
Potassium: 4.1 mmol/L (ref 3.5–5.1)
SODIUM: 138 mmol/L (ref 135–145)
TOTAL PROTEIN: 6.8 g/dL (ref 6.5–8.1)

## 2016-12-07 LAB — CBC WITH DIFFERENTIAL/PLATELET
Basophils Absolute: 0 10*3/uL (ref 0.0–0.1)
Basophils Relative: 0 %
EOS ABS: 0 10*3/uL (ref 0.0–0.7)
Eosinophils Relative: 0 %
HEMATOCRIT: 35.8 % — AB (ref 36.0–46.0)
HEMOGLOBIN: 10.9 g/dL — AB (ref 12.0–15.0)
LYMPHS ABS: 1.3 10*3/uL (ref 0.7–4.0)
LYMPHS PCT: 10 %
MCH: 25.6 pg — AB (ref 26.0–34.0)
MCHC: 30.4 g/dL (ref 30.0–36.0)
MCV: 84 fL (ref 78.0–100.0)
MONOS PCT: 2 %
Monocytes Absolute: 0.3 10*3/uL (ref 0.1–1.0)
NEUTROS PCT: 88 %
Neutro Abs: 11.3 10*3/uL — ABNORMAL HIGH (ref 1.7–7.7)
Platelets: 298 10*3/uL (ref 150–400)
RBC: 4.26 MIL/uL (ref 3.87–5.11)
RDW: 15.7 % — ABNORMAL HIGH (ref 11.5–15.5)
WBC: 12.8 10*3/uL — ABNORMAL HIGH (ref 4.0–10.5)

## 2016-12-07 LAB — PHOSPHORUS: PHOSPHORUS: 3 mg/dL (ref 2.5–4.6)

## 2016-12-07 LAB — MAGNESIUM: MAGNESIUM: 2.1 mg/dL (ref 1.7–2.4)

## 2016-12-07 MED ORDER — SENNOSIDES-DOCUSATE SODIUM 8.6-50 MG PO TABS: 1.0000 | ORAL_TABLET | Freq: Every evening | ORAL | 0 refills | Status: DC | PRN

## 2016-12-07 MED ORDER — GUAIFENESIN ER 600 MG PO TB12: 1200.0000 mg | ORAL_TABLET | Freq: Two times a day (BID) | ORAL | 0 refills | Status: DC

## 2016-12-07 MED ORDER — IPRATROPIUM-ALBUTEROL 0.5-2.5 (3) MG/3ML IN SOLN
3.0000 mL | Freq: Three times a day (TID) | RESPIRATORY_TRACT | Status: DC
  Filled 2016-12-07: qty 3

## 2016-12-07 MED ORDER — AZITHROMYCIN 500 MG PO TABS: 500.0000 mg | ORAL_TABLET | Freq: Every day | ORAL | 0 refills | Status: AC

## 2016-12-07 MED ORDER — ALBUTEROL SULFATE (2.5 MG/3ML) 0.083% IN NEBU: 2.5000 mg | INHALATION_SOLUTION | Freq: Four times a day (QID) | RESPIRATORY_TRACT | 1 refills | Status: DC | PRN

## 2016-12-07 MED ORDER — PREDNISONE 10 MG (21) PO TBPK: ORAL_TABLET | ORAL | 0 refills | Status: DC

## 2016-12-07 MED ORDER — METHYLPREDNISOLONE SODIUM SUCC 125 MG IJ SOLR: 60.0000 mg | Freq: Two times a day (BID) | INTRAMUSCULAR | Status: DC

## 2016-12-07 MED ORDER — GUAIFENESIN ER 600 MG PO TB12
1200.0000 mg | ORAL_TABLET | Freq: Two times a day (BID) | ORAL | 0 refills | Status: DC
Start: 2016-12-07 — End: 2017-04-29

## 2016-12-07 MED ORDER — BISACODYL 5 MG PO TBEC: 5.0000 mg | DELAYED_RELEASE_TABLET | Freq: Every day | ORAL | 0 refills | Status: DC | PRN

## 2016-12-07 MED ORDER — AZITHROMYCIN 500 MG PO TABS: 500.0000 mg | ORAL_TABLET | Freq: Every day | ORAL | 0 refills | Status: DC

## 2016-12-07 NOTE — Progress Notes (Signed)
Discharge instructions, medications/prescriptions and appointments discussed and reviewed with pt, verbalized understanding. IV dc'ed, site clean and dry. Pt was escorted out of the unit in wheelchair, took all belongings with her.

## 2016-12-07 NOTE — Progress Notes (Signed)
SATURATION QUALIFICATIONS: (This note is used to comply with regulatory documentation for home oxygen)  Patient Saturations on Room Air at Rest = 95%  Patient Saturations on Room Air while Ambulating = 88%  Patient Saturations on 1 Liter of oxygen while Ambulating = 95%  Please briefly explain why patient needs home oxygen: pt de-sat while ambulating.

## 2016-12-07 NOTE — Care Management Note (Signed)
Case Management Note  Patient Details  Name: [name] MRN: [id number] Date of Birth: May 09, 1975  Subjective/Objective:        CM following for progression and d/c planning.             Action/Plan: 12/07/2016 Met with pt to discuss any d/c needs, pt has home oxygen from [facility], [facility] notified of pt needs for oxygen for transport home, however pt has called family and they will bring tank. Per [facility] pt was on 2L/min continuously previously so no changes in orders needed. Pt states that she has been referred to a pulmonologist by her PCP and is awaiting an appointment to be scheduled, she is unable to provide the name of the pulmonologist.  Pt states that she has family support and family/friends who can pick up meds or provide transportation if needed.  No other needs identified. Pt is not eligible for [facility] followup.    Expected Discharge Date:  12/07/16               Expected Discharge Plan:  Home/Self Care  In-House Referral:  NA  Discharge planning Services  CM Consult  Post Acute Care Choice:  NA Choice offered to:  NA  DME Arranged:  Oxygen DME Agency:  [place]:  NA [place] Agency:  NA  Status of Service:  Completed, signed off  If discussed at [place] of Stay Meetings, dates discussed:    Additional Comments:  Adron Bene, RN 12/07/2016, 2:02 PM

## 2016-12-07 NOTE — Evaluation (Signed)
Occupational Therapy Evaluation and Discharge Patient Details Name: DALAYAH DEAHL MRN: [id number] DOB: 02/27/1975 Today's Date: 12/07/2016    History of Present Illness Pt is a 41 y/o female admitted secondary to worsening SOB and cough. Imaging showed bilat atelectasis. PMH includes COPD, smoker, alcohol abuse, depression, and s/p back surgery.    Clinical Impression   PTA Pt independent in ADL and mobility. Pt is currently modified independent, just requires O2. Pt provided handout and information on energy conservation and shared that she plans to cut back on her part time job until health improves. Pt with no further questions or concerns for OT. Education complete. OT to sign off at this time. Thank you for the opportunity to serve this patient.    Follow Up Recommendations  No OT follow up    Equipment Recommendations  None recommended by OT    Recommendations for Other Services       Precautions / Restrictions Precautions Precautions: Other (comment) Precaution Comments: watch oxygen sats Restrictions Weight Bearing Restrictions: No      Mobility Bed Mobility Overal bed mobility: Modified Independent                Transfers Overall transfer level: Needs assistance Equipment used: None Transfers: Sit to/from Stand Sit to Stand: Supervision         General transfer comment: supervision for safety.     Balance Overall balance assessment: Needs assistance Sitting-balance support: Feet supported;No upper extremity supported Sitting balance-Leahy Scale: Good     Standing balance support: No upper extremity supported;During functional activity Standing balance-Leahy Scale: Fair                             ADL either performed or assessed with clinical judgement   ADL Overall ADL's : Modified independent                                       General ADL Comments: able to perform toilet transfer, oral care at sink,  LB dressing with no assist or LOB     Vision Patient Visual Report: No change from baseline       Perception     Praxis      Pertinent Vitals/Pain Pain Assessment: No/denies pain     Hand Dominance Right   Extremity/Trunk Assessment Upper Extremity Assessment Upper Extremity Assessment: Overall WFL for tasks assessed   Lower Extremity Assessment Lower Extremity Assessment: Defer to PT evaluation   Cervical / Trunk Assessment Cervical / Trunk Assessment: Normal   Communication Communication Communication: No difficulties   Cognition Arousal/Alertness: Awake/alert Behavior During Therapy: WFL for tasks assessed/performed Overall Cognitive Status: Within Functional Limits for tasks assessed                                     General Comments  Pt provided with energy conservation handout and education. Pt verbalized understanding. O2 levels monitored during session, lowest seen was 89% but back up again with pursed lip breathing. Pt non-symptomatic.     Exercises     Shoulder Instructions      Home Living Family/patient expects to be discharged to:: Private residence Living Arrangements: Children;Spouse/significant other Available Help at Discharge: Family;Available 24 hours/day Type of Home: Apartment Home Access: Level entry  Home Layout: One level     Bathroom Shower/Tub: Tub/shower unit;Walk-in shower   Bathroom Toilet: Standard     Home Equipment: Shower seat - built in;Other (comment) (oxygen )   Additional Comments: Pt reports she is on oxygen at night and has it for when she needs it.       Prior Functioning/Environment Level of Independence: Independent        Comments: Driving, still working full time at [employer], and part time at [employer]        OT Problem List:        OT Treatment/Interventions:      OT Goals(Current goals can be found in the care plan section) Acute Rehab OT Goals Patient Stated Goal: to go  home OT Goal Formulation: With patient Time For Goal Achievement: 12/13/16 Potential to Achieve Goals: Good  OT Frequency:     Barriers to D/C:            Co-evaluation              AM-PAC PT "6 Clicks" Daily Activity     Outcome Measure Help from another person eating meals?: None Help from another person taking care of personal grooming?: None Help from another person toileting, which includes using toliet, bedpan, or urinal?: None Help from another person bathing (including washing, rinsing, drying)?: None Help from another person to put on and taking off regular upper body clothing?: None Help from another person to put on and taking off regular lower body clothing?: None 6 Click Score: 24   End of Session Equipment Utilized During Treatment: Gait belt;Oxygen (3L) Nurse Communication: Mobility status  Activity Tolerance: Patient tolerated treatment well Patient left: in chair;with call bell/phone within reach;with nursing/sitter in room                   Time: 1005-1021 OT Time Calculation (min): 16 min Charges:  OT General Charges $OT Visit: 1 Visit OT Evaluation $OT Eval Low Complexity: 1 Low G-Codes:     Hulda Humphrey OTR/L [place] 12/07/2016, 10:36 AM

## 2016-12-07 NOTE — Discharge Summary (Signed)
Physician Discharge Summary  [name] IRW:[id number] DOB: Nov 29, 1975 DOA: 12/05/2016  PCP: Nat Christen, PA-C  Admit date: 12/05/2016 Discharge date: 12/07/2016  Admitted From: Home Disposition: Home  Recommendations for Outpatient Follow-up:  1. Follow up with PCP in 1-2 weeks 2. Please obtain CMP/CBC, Mag, Phos in one week 3. Have PCP repeat TSH 4. Repeat CXR in 3-4 weeks 5. Have PCP refer for Pulmonary Evaluation if necessary 6. Please follow up on the following pending results:  Home Health: No  Equipment/Devices: None    Discharge Condition: Stable  CODE STATUS: FULL CODE Diet recommendation: Regular Diet   Brief/Interim Summary: Brief Narrative: The patient is a 41 yo Female with a PMH significant for COPD, Depression, Chronic Anemia and other comorbids who presented with SOB, wheezing, rhinorrhea, and worsening aches. She was admitted with a COPD Exacerbation and Acute URI.   Patient reported that she been in her usual state until approximately 2 days ago when she developed copious clear rhinorrhea and sore throat. She also began to have body aches at that time. She had been exposed to a housemate with similar symptoms. Over the past 24 hours, she has developed rapidly progressive dyspnea and wheezing. She used her home neb with only fleeting relief. Denies lower extremity swelling or tenderness and denies fevers or chills. No chest pain or palpitations. Upon arrival to the ED, patient is found to be afebrile, requiring 3 L/min to maintain O2 saturations in the low 90s while cardiac, and with vitals otherwise stable.Patient was given 1 L of normal saline, 60 mg prednisone, 10 mg continuous albuterol treatment x2, and continued to be dyspneic while at rest. She tested positive for Rhino/Enterovirus and was treated with Supplemental O2, Nebulized Breathing Treatments, Abx, and Steroids. She was deemed medically stable to D/C home and will need to follow up with PCP  and have outpatient Pulmonary Evaluation.   Discharge Diagnoses:  Principal Problem:   COPD exacerbation ([place]) Active Problems:   Tobacco abuse   Anemia   Hypokalemia   Hypothyroidism   Acute on chronic respiratory failure with hypoxia ([facility])   COPD with acute exacerbation ([facility])   Leukocytosis   Rhinovirus infection  Acute on Chronic Respiratory Failure with Hypoxia 2/2 to COPD Exacerbation in the setting of Rhino/Enterovirus URI -Pt presented with acute respiratory distress and hypoxia on rm air  -CXR negative for conspicuous infiltrate  -Suspect COPD exacerbation precipitated by acute viral URI  -She was treated in ED with prednisone, 10 mg continuous albuterol neb x2, and suplemental O2, but continued to be symptomatic and hypoxic at rest  -Continued supplemental O2, DuoNeb, Abx, and Steroids -Walk Screen done and patient does not need more O2 -Follow up with PCP as an outpatient and outpatient Pulmonary  COPD Exacerbation 2/2 to Viral URI -Pt reports copious rhinorrhea, sore throat, body aches for past 2 days after exposure to house mate with similar  -Checked respiratory virus panel and was Positive for Rhino/Enterovirus -Improving -Changed Abx to po and placed on Steroid Taper -Follow up with PCP and have PCP refer to Pulmonary as an outpatient.    Tobacco Abuse -Smoking Cessation Counseling given -C/w Chantix   Leukocytosis  -WBC went from 4.5 -> 12.8 -Likely from IV Steroid Demargination -Continue to Monitor for S/Sx of Infection -Repeat CBC as an outpatient   Hypokalemia -Improved. K+ went from 3.3 -> 4.1 -Replete yesterday with 40 mEQ of KCl BID -Continue to Monitor and Replete as Necessary -Repeat CMP as an outpatient  Normocytic Anemia  -Hb/Hct Stable at 10.9/35.8 -Repeat CBC as an outpatient   Hypothyroidism  -Appears to be stable -Continue Synthroid at 137 mcg -Follow up with PCP to have TSH rechecked   Hyperglycemia -In the setting of IV  Steroid Use -Continue to Monitor and if CBG's > 200 consistently will add Sensitive Novolog SSI  -Follow up with PCP   Discharge Instructions   Allergies as of 12/07/2016   No Known Allergies     Medication List    TAKE these medications   albuterol 108 (90 Base) MCG/ACT inhaler Commonly known as:  PROVENTIL HFA;VENTOLIN HFA Inhale 1-2 puffs into the lungs every 6 (six) hours as needed for wheezing or shortness of breath.   albuterol (2.5 MG/3ML) 0.083% nebulizer solution Commonly known as:  PROVENTIL Take 3 mLs (2.5 mg total) by nebulization every 6 (six) hours as needed for wheezing or shortness of breath. For asthma   azithromycin 500 MG tablet Commonly known as:  ZITHROMAX Take 1 tablet (500 mg total) by mouth daily. Take 1 tablet daily for 3 days.   bisacodyl 5 MG EC tablet Commonly known as:  DULCOLAX Take 1 tablet (5 mg total) by mouth daily as needed for moderate constipation.   FLUoxetine 40 MG capsule Commonly known as:  PROZAC Take 40 mg by mouth daily.   Fluticasone-Salmeterol 500-50 MCG/DOSE Aepb Commonly known as:  ADVAIR Inhale 2 puffs into the lungs 2 (two) times daily.   guaiFENesin 600 MG 12 hr tablet Commonly known as:  MUCINEX Take 2 tablets (1,200 mg total) by mouth 2 (two) times daily.   ipratropium-albuterol 0.5-2.5 (3) MG/3ML Soln Commonly known as:  DUONEB Inhale 3 mLs into the lungs every 6 (six) hours as needed (for breathing).   levothyroxine 137 MCG tablet Commonly known as:  SYNTHROID, LEVOTHROID Take 137 mcg by mouth daily.   montelukast 10 MG tablet Commonly known as:  SINGULAIR Take 10 mg by mouth at bedtime.   predniSONE 10 MG (21) Tbpk tablet Commonly known as:  STERAPRED UNI-PAK 21 TAB Take 6 Tablets Day 1, 5 Tablets Day 2, 4 Tablets Day 3, 3 Tablets Day 4, 2 Tablets Day 5, 1 Tablet Day 6, and Stop Day 7   senna-docusate 8.6-50 MG tablet Commonly known as:  Senokot-S Take 1 tablet by mouth at bedtime as needed for mild  constipation.   varenicline 1 MG tablet Commonly known as:  CHANTIX Take 1 mg by mouth daily.       No Known Allergies  Consultations:  None  Procedures/Studies: Dg Chest 2 View  Result Date: 12/05/2016 CLINICAL DATA:  Acute onset of cough and sore throat. Shortness of breath. Wheezing. Initial encounter. EXAM: CHEST  2 VIEW COMPARISON:  Chest radiograph performed 08/20/2016 FINDINGS: The lungs are well-aerated. Minimal bilateral atelectasis is noted. There is no evidence of pleural effusion or pneumothorax. The heart is normal in size; the mediastinal contour is within normal limits. No acute osseous abnormalities are seen. IMPRESSION: Minimal bilateral atelectasis noted.  Lungs otherwise clear. Electronically Signed   By: Garald Balding [name]   On: 12/05/2016 21:21    Subjective: Seen and examined and felt better. No CP. SOB stable and feels better and is coughing up yellow sputum. No other complaints and wanting to go home.  Discharge Exam: Vitals:   12/07/16 0900 12/07/16 0950  BP:  (!) 98/56  Pulse: 77 80  Resp: 16 18  Temp:  97.8 F (36.6 C)  SpO2: 97% 97%  Vitals:   12/06/16 1919 12/06/16 2136 12/07/16 0900 12/07/16 0950  BP:  123/63  (!) 98/56  Pulse:  83 77 80  Resp:  17 16 18   Temp:  97.8 F (36.6 C)  97.8 F (36.6 C)  TempSrc:  Oral  Oral  SpO2: 98% 96% 97% 97%  Weight:  84.2 kg (185 lb 11.8 oz)    Height:       General: Pt is alert, awake, not in acute distress Cardiovascular: RRR, S1/S2 +, no rubs, no gallops Respiratory: Diminished bilaterally with expiratory wheezing, no rhonchi; Patient was not tachypenic Abdominal: Soft, NT, ND, bowel sounds + Extremities: no edema, no cyanosis  The results of significant diagnostics from this hospitalization (including imaging, microbiology, ancillary and laboratory) are listed below for reference.    Microbiology: Recent Results (from the past 240 hour(s))  Respiratory Panel by PCR     Status: Abnormal    Collection Time: 12/06/16  3:16 AM  Result Value Ref Range Status   Adenovirus NOT DETECTED NOT DETECTED Final   Coronavirus 229E NOT DETECTED NOT DETECTED Final   Coronavirus HKU1 NOT DETECTED NOT DETECTED Final   Coronavirus NL63 NOT DETECTED NOT DETECTED Final   Coronavirus OC43 NOT DETECTED NOT DETECTED Final   Metapneumovirus NOT DETECTED NOT DETECTED Final   Rhinovirus / Enterovirus DETECTED (A) NOT DETECTED Final   Influenza A NOT DETECTED NOT DETECTED Final   Influenza B NOT DETECTED NOT DETECTED Final   Parainfluenza Virus 1 NOT DETECTED NOT DETECTED Final   Parainfluenza Virus 2 NOT DETECTED NOT DETECTED Final   Parainfluenza Virus 3 NOT DETECTED NOT DETECTED Final   Parainfluenza Virus 4 NOT DETECTED NOT DETECTED Final   Respiratory Syncytial Virus NOT DETECTED NOT DETECTED Final   Bordetella pertussis NOT DETECTED NOT DETECTED Final   Chlamydophila pneumoniae NOT DETECTED NOT DETECTED Final   Mycoplasma pneumoniae NOT DETECTED NOT DETECTED Final    Labs: BNP (last 3 results) No results for input(s): BNP in the last 8760 hours. Basic Metabolic Panel:  Recent Labs Lab 12/05/16 2213 12/06/16 0352 12/07/16 0339  NA 136 137 138  K 3.6 3.3* 4.1  CL 108 106 108  CO2 17* 19* 22  GLUCOSE 81 160* 134*  BUN 10 8 8   CREATININE 0.73 0.70 0.61  CALCIUM 8.9 8.7* 9.1  MG  --   --  2.1  PHOS  --   --  3.0   Liver Function Tests:  Recent Labs Lab 12/07/16 0339  AST 14*  ALT 14  ALKPHOS 56  BILITOT 0.5  PROT 6.8  ALBUMIN 3.7   No results for input(s): LIPASE, AMYLASE in the last 168 hours. No results for input(s): AMMONIA in the last 168 hours. CBC:  Recent Labs Lab 12/05/16 2213 12/06/16 0352 12/07/16 0339  WBC 5.9 4.5 12.8*  NEUTROABS 2.7 3.9 11.3*  HGB 10.9* 10.3* 10.9*  HCT 35.8* 33.9* 35.8*  MCV 83.4 83.5 84.0  PLT 251 231 298   Cardiac Enzymes:  Recent Labs Lab 12/05/16 2213  TROPONINI <0.03   BNP: Invalid input(s): POCBNP CBG: No results  for input(s): GLUCAP in the last 168 hours. D-Dimer No results for input(s): DDIMER in the last 72 hours. Hgb A1c No results for input(s): HGBA1C in the last 72 hours. Lipid Profile No results for input(s): CHOL, HDL, LDLCALC, TRIG, CHOLHDL, LDLDIRECT in the last 72 hours. Thyroid function studies No results for input(s): TSH, T4TOTAL, T3FREE, THYROIDAB in the last 72 hours.  Invalid input(s):  FREET3 Anemia work up No results for input(s): VITAMINB12, FOLATE, FERRITIN, TIBC, IRON, RETICCTPCT in the last 72 hours. Urinalysis    Component Value Date/Time   COLORURINE STRAW (A) 05/10/2016 1227   APPEARANCEUR CLEAR (A) 05/10/2016 1227   APPEARANCEUR Hazy 05/10/2013 1216   LABSPEC 1.001 (L) 05/10/2016 1227   LABSPEC 1.014 05/10/2013 1216   PHURINE 8.0 05/10/2016 1227   GLUCOSEU NEGATIVE 05/10/2016 1227   GLUCOSEU Negative 05/10/2013 1216   HGBUR SMALL (A) 05/10/2016 1227   BILIRUBINUR NEGATIVE 05/10/2016 1227   BILIRUBINUR Negative 05/10/2013 1216   KETONESUR NEGATIVE 05/10/2016 1227   PROTEINUR NEGATIVE 05/10/2016 1227   UROBILINOGEN 0.2 09/10/2011 0250   NITRITE NEGATIVE 05/10/2016 1227   LEUKOCYTESUR NEGATIVE 05/10/2016 1227   LEUKOCYTESUR Negative 05/10/2013 1216   Sepsis Labs Invalid input(s): PROCALCITONIN,  WBC,  LACTICIDVEN Microbiology Recent Results (from the past 240 hour(s))  Respiratory Panel by PCR     Status: Abnormal   Collection Time: 12/06/16  3:16 AM  Result Value Ref Range Status   Adenovirus NOT DETECTED NOT DETECTED Final   Coronavirus 229E NOT DETECTED NOT DETECTED Final   Coronavirus HKU1 NOT DETECTED NOT DETECTED Final   Coronavirus NL63 NOT DETECTED NOT DETECTED Final   Coronavirus OC43 NOT DETECTED NOT DETECTED Final   Metapneumovirus NOT DETECTED NOT DETECTED Final   Rhinovirus / Enterovirus DETECTED (A) NOT DETECTED Final   Influenza A NOT DETECTED NOT DETECTED Final   Influenza B NOT DETECTED NOT DETECTED Final   Parainfluenza Virus 1 NOT  DETECTED NOT DETECTED Final   Parainfluenza Virus 2 NOT DETECTED NOT DETECTED Final   Parainfluenza Virus 3 NOT DETECTED NOT DETECTED Final   Parainfluenza Virus 4 NOT DETECTED NOT DETECTED Final   Respiratory Syncytial Virus NOT DETECTED NOT DETECTED Final   Bordetella pertussis NOT DETECTED NOT DETECTED Final   Chlamydophila pneumoniae NOT DETECTED NOT DETECTED Final   Mycoplasma pneumoniae NOT DETECTED NOT DETECTED Final   Time coordinating discharge: 35 minutes  SIGNED:  Kerney Elbe, DO [facility] 12/07/2016, 1:41 PM Pager [phone]  If 7PM-7AM, please contact night-coverage [email or web address] Password [email or web address]

## 2017-04-29 ENCOUNTER — Emergency Department (HOSPITAL_COMMUNITY): Payer: BLUE CROSS/BLUE SHIELD

## 2017-04-29 ENCOUNTER — Encounter (HOSPITAL_COMMUNITY): Payer: Self-pay

## 2017-04-29 ENCOUNTER — Other Ambulatory Visit: Payer: Self-pay

## 2017-04-29 ENCOUNTER — Inpatient Hospital Stay (HOSPITAL_COMMUNITY)
Admission: EM | Admit: 2017-04-29 | Discharge: 2017-05-02 | DRG: 190 | Disposition: A | Discharge status: Home / Self Care | Payer: BLUE CROSS/BLUE SHIELD | Attending: Internal Medicine | Admitting: Internal Medicine

## 2017-04-29 DIAGNOSIS — Z7951 Long term (current) use of inhaled steroids: Secondary | ICD-10-CM

## 2017-04-29 DIAGNOSIS — Z79890 Hormone replacement therapy (postmenopausal): Secondary | ICD-10-CM

## 2017-04-29 DIAGNOSIS — Z716 Tobacco abuse counseling: Secondary | ICD-10-CM | POA: Diagnosis not present

## 2017-04-29 DIAGNOSIS — J9811 Atelectasis: Secondary | ICD-10-CM | POA: Diagnosis present

## 2017-04-29 DIAGNOSIS — Z808 Family history of malignant neoplasm of other organs or systems: Secondary | ICD-10-CM | POA: Diagnosis not present

## 2017-04-29 DIAGNOSIS — J9601 Acute respiratory failure with hypoxia: Secondary | ICD-10-CM | POA: Diagnosis not present

## 2017-04-29 DIAGNOSIS — Z9981 Dependence on supplemental oxygen: Secondary | ICD-10-CM | POA: Diagnosis not present

## 2017-04-29 DIAGNOSIS — Z833 Family history of diabetes mellitus: Secondary | ICD-10-CM

## 2017-04-29 DIAGNOSIS — Z720 Tobacco use: Secondary | ICD-10-CM | POA: Diagnosis not present

## 2017-04-29 DIAGNOSIS — J9621 Acute and chronic respiratory failure with hypoxia: Secondary | ICD-10-CM | POA: Diagnosis present

## 2017-04-29 DIAGNOSIS — Z8249 Family history of ischemic heart disease and other diseases of the circulatory system: Secondary | ICD-10-CM | POA: Diagnosis not present

## 2017-04-29 DIAGNOSIS — E032 Hypothyroidism due to medicaments and other exogenous substances: Secondary | ICD-10-CM | POA: Diagnosis not present

## 2017-04-29 DIAGNOSIS — J441 Chronic obstructive pulmonary disease with (acute) exacerbation: Principal | ICD-10-CM | POA: Diagnosis present

## 2017-04-29 DIAGNOSIS — F17210 Nicotine dependence, cigarettes, uncomplicated: Secondary | ICD-10-CM | POA: Diagnosis present

## 2017-04-29 DIAGNOSIS — E038 Other specified hypothyroidism: Secondary | ICD-10-CM

## 2017-04-29 DIAGNOSIS — E039 Hypothyroidism, unspecified: Secondary | ICD-10-CM | POA: Diagnosis present

## 2017-04-29 DIAGNOSIS — F329 Major depressive disorder, single episode, unspecified: Secondary | ICD-10-CM | POA: Diagnosis present

## 2017-04-29 DIAGNOSIS — Z79899 Other long term (current) drug therapy: Secondary | ICD-10-CM | POA: Diagnosis not present

## 2017-04-29 LAB — CBC WITH DIFFERENTIAL/PLATELET
Basophils Absolute: 0 10*3/uL (ref 0.0–0.1)
Basophils Relative: 0 %
Eosinophils Absolute: 0.3 10*3/uL (ref 0.0–0.7)
Eosinophils Relative: 2 %
HCT: 41.3 % (ref 36.0–46.0)
HEMOGLOBIN: 12.9 g/dL (ref 12.0–15.0)
LYMPHS ABS: 3 10*3/uL (ref 0.7–4.0)
LYMPHS PCT: 27 %
MCH: 27.1 pg (ref 26.0–34.0)
MCHC: 31.2 g/dL (ref 30.0–36.0)
MCV: 86.8 fL (ref 78.0–100.0)
MONOS PCT: 5 %
Monocytes Absolute: 0.5 10*3/uL (ref 0.1–1.0)
Neutro Abs: 7.3 10*3/uL (ref 1.7–7.7)
Neutrophils Relative %: 66 %
Platelets: 260 10*3/uL (ref 150–400)
RBC: 4.76 MIL/uL (ref 3.87–5.11)
RDW: 16.8 % — ABNORMAL HIGH (ref 11.5–15.5)
WBC: 11.1 10*3/uL — AB (ref 4.0–10.5)

## 2017-04-29 LAB — TROPONIN I: Troponin I: <0.03 ng/mL (ref <0.03)

## 2017-04-29 LAB — BASIC METABOLIC PANEL
Anion gap: 14 (ref 5–15)
BUN: 9 mg/dL (ref 6–20)
CHLORIDE: 105 mmol/L (ref 101–111)
CO2: 19 mmol/L — ABNORMAL LOW (ref 22–32)
CREATININE: 0.65 mg/dL (ref 0.44–1.00)
Calcium: 9.4 mg/dL (ref 8.9–10.3)
GFR calc Af Amer: >60 mL/min (ref >60)
GFR calc non Af Amer: >60 mL/min (ref >60)
GLUCOSE: 76 mg/dL (ref 65–99)
POTASSIUM: 3.7 mmol/L (ref 3.5–5.1)
Sodium: 138 mmol/L (ref 135–145)

## 2017-04-29 LAB — INFLUENZA PANEL BY PCR (TYPE A & B)
INFLAPCR: NEGATIVE
INFLBPCR: NEGATIVE

## 2017-04-29 MED ORDER — METHYLPREDNISOLONE SODIUM SUCC 125 MG IJ SOLR
125.0000 mg | Freq: Once | INTRAMUSCULAR | Status: AC
  Administered 2017-04-29: 125 mg via INTRAVENOUS
  Filled 2017-04-29: qty 2

## 2017-04-29 MED ORDER — LEVALBUTEROL HCL 0.63 MG/3ML IN NEBU
0.6300 mg | INHALATION_SOLUTION | RESPIRATORY_TRACT | Status: DC | PRN
  Administered 2017-04-29 – 2017-05-02 (×4): 0.63 mg via RESPIRATORY_TRACT
  Filled 2017-04-29 (×2): qty 3

## 2017-04-29 MED ORDER — LEVOFLOXACIN IN D5W 500 MG/100ML IV SOLN: 500.0000 mg | Freq: Once | INTRAVENOUS | Status: DC

## 2017-04-29 MED ORDER — BUDESONIDE 0.5 MG/2ML IN SUSP
0.5000 mg | Freq: Two times a day (BID) | RESPIRATORY_TRACT | Status: DC
  Administered 2017-04-29 – 2017-05-02 (×6): 0.5 mg via RESPIRATORY_TRACT
  Filled 2017-04-29 (×5): qty 2

## 2017-04-29 MED ORDER — ONDANSETRON HCL 4 MG/2ML IJ SOLN
4.0000 mg | Freq: Once | INTRAMUSCULAR | Status: AC
  Administered 2017-04-29: 4 mg via INTRAVENOUS
  Filled 2017-04-29: qty 2

## 2017-04-29 MED ORDER — ALBUTEROL (5 MG/ML) CONTINUOUS INHALATION SOLN
10.0000 mg/h | INHALATION_SOLUTION | Freq: Once | RESPIRATORY_TRACT | Status: AC
  Administered 2017-04-29: 10 mg/h via RESPIRATORY_TRACT
  Filled 2017-04-29: qty 20

## 2017-04-29 MED ORDER — IPRATROPIUM BROMIDE 0.02 % IN SOLN
0.5000 mg | Freq: Four times a day (QID) | RESPIRATORY_TRACT | Status: DC
  Administered 2017-04-29 – 2017-05-02 (×10): 0.5 mg via RESPIRATORY_TRACT
  Filled 2017-04-29 (×10): qty 2.5

## 2017-04-29 MED ORDER — KETOROLAC TROMETHAMINE 30 MG/ML IJ SOLN
30.0000 mg | Freq: Once | INTRAMUSCULAR | Status: AC
  Administered 2017-04-29: 30 mg via INTRAVENOUS
  Filled 2017-04-29: qty 1

## 2017-04-29 MED ORDER — LEVALBUTEROL HCL 0.63 MG/3ML IN NEBU
1.2500 mg | INHALATION_SOLUTION | Freq: Four times a day (QID) | RESPIRATORY_TRACT | Status: DC
  Administered 2017-04-29 – 2017-04-30 (×3): 1.25 mg via RESPIRATORY_TRACT
  Administered 2017-04-30: 1.26 mg via RESPIRATORY_TRACT
  Administered 2017-04-30: 1.25 mg via RESPIRATORY_TRACT
  Filled 2017-04-29 (×3): qty 6

## 2017-04-29 MED ORDER — MONTELUKAST SODIUM 10 MG PO TABS
10.0000 mg | ORAL_TABLET | Freq: Every day | ORAL | Status: DC
Start: 2017-04-29 — End: 2017-05-02
  Administered 2017-04-29 – 2017-05-01 (×3): 10 mg via ORAL
  Filled 2017-04-29 (×3): qty 1

## 2017-04-29 MED ORDER — METHYLPREDNISOLONE SODIUM SUCC 125 MG IJ SOLR: 125.0000 mg | Freq: Once | INTRAMUSCULAR | Status: DC

## 2017-04-29 MED ORDER — ENOXAPARIN SODIUM 40 MG/0.4ML ~~LOC~~ SOLN
40.0000 mg | SUBCUTANEOUS | Status: DC
  Administered 2017-04-29 – 2017-05-01 (×3): 40 mg via SUBCUTANEOUS
  Filled 2017-04-29 (×3): qty 0.4

## 2017-04-29 MED ORDER — ALBUTEROL (5 MG/ML) CONTINUOUS INHALATION SOLN
10.0000 mg/h | INHALATION_SOLUTION | Freq: Once | RESPIRATORY_TRACT | Status: AC
  Administered 2017-04-29: 10 mg/h via RESPIRATORY_TRACT

## 2017-04-29 MED ORDER — LEVOTHYROXINE SODIUM 25 MCG PO TABS
137.0000 ug | ORAL_TABLET | Freq: Every day | ORAL | Status: DC
  Administered 2017-04-30 – 2017-05-02 (×3): 137 ug via ORAL
  Filled 2017-04-29 (×3): qty 1

## 2017-04-29 MED ORDER — ACETAMINOPHEN 325 MG PO TABS
650.0000 mg | ORAL_TABLET | Freq: Four times a day (QID) | ORAL | Status: DC | PRN
  Administered 2017-04-30: 650 mg via ORAL
  Filled 2017-04-29: qty 2

## 2017-04-29 MED ORDER — IPRATROPIUM-ALBUTEROL 0.5-2.5 (3) MG/3ML IN SOLN: 3.0000 mL | Freq: Once | RESPIRATORY_TRACT | Status: DC

## 2017-04-29 MED ORDER — PANTOPRAZOLE SODIUM 40 MG PO TBEC
40.0000 mg | DELAYED_RELEASE_TABLET | Freq: Every day | ORAL | Status: DC
  Administered 2017-04-29 – 2017-05-02 (×4): 40 mg via ORAL
  Filled 2017-04-29 (×4): qty 1

## 2017-04-29 MED ORDER — PHENOL 1.4 % MT LIQD: 1.0000 | OROMUCOSAL | Status: DC | PRN

## 2017-04-29 MED ORDER — METHYLPREDNISOLONE SODIUM SUCC 125 MG IJ SOLR
60.0000 mg | Freq: Four times a day (QID) | INTRAMUSCULAR | Status: DC
  Administered 2017-04-29 – 2017-05-02 (×10): 60 mg via INTRAVENOUS
  Filled 2017-04-29 (×11): qty 2

## 2017-04-29 MED ORDER — FLUOXETINE HCL 20 MG PO CAPS
40.0000 mg | ORAL_CAPSULE | Freq: Every day | ORAL | Status: DC
  Administered 2017-04-30 – 2017-05-02 (×3): 40 mg via ORAL
  Filled 2017-04-29 (×3): qty 2

## 2017-04-29 NOTE — H&P (Signed)
History and Physical  [name] KKX:[id number] DOB: 1975/12/09 DOA: 04/29/2017   PCP: Alvester Chou, NP   Patient coming from: Home  Chief Complaint: Shortness of breath  HPI:  [name] is a 42 y.o. female with medical history of COPD, hypothyroidism, depression presenting with 1 day history of shortness of breath that began on 04/28/2017.  Patient began complaining of sharp substernal pain that is been constant since that period of time.  She denies any dizziness, syncope, diaphoresis.  She has not had any fevers, chills, vomiting, diarrhea, abdominal pain, dysuria, headache, neck pain.  She has had some chest congestion and nonproductive cough without hemoptysis.  She denies any recent sick contacts or long travels.  In the emergency department, the patient was afebrile hemodynamically stable saturating 80% on room air. BMP and CBC were essentially unremarkable.  Influenza PCR was negative.  EKG shows sinus rhythm with nonspecific T wave changes.  Chest x-ray showed bibasilar atelectasis without consolidation.  The patient was started on Solu-Medrol and bronchodilators.  Assessment/Plan: Acute respiratory failure with hypoxia -Secondary to COPD exacerbation -Wean oxygen for saturation greater than 92%  COPD exacerbation -Viral respiratory panel -Start Pulmicort -Continue IV steroids -Continue bronchodilators  Atypical chest pain -Cycle troponins -Chest x-ray negative -EKG without concerning ischemic changes  Tobacco abuse -Tobacco cessation discussed -She has approximately 40-pack-year history  Hypothyroidism -Continue Synthroid  Depression -Continue fluoxetine        Past Medical History:  Diagnosis Date  . Alcohol abuse   . Anemia 01/31/2011  . Asthma   . COPD (chronic obstructive pulmonary disease) ([place])   . Depression   . Hypoxia 02/01/2011  . Needs sleep apnea assessment 2013.07.31  . Tobacco abuse    Past Surgical History:    Procedure Laterality Date  . ANKLE GANGLION CYST EXCISION    . BACK SURGERY    . CESAREAN SECTION    . COLONOSCOPY WITH PROPOFOL N/A 06/27/2016   Procedure: COLONOSCOPY WITH PROPOFOL;  Surgeon: Jonathon Bellows, MD;  Location: [facility] ENDOSCOPY;  Service: Endoscopy;  Laterality: N/A;  . DILATION AND CURETTAGE OF UTERUS    . ESOPHAGOGASTRODUODENOSCOPY (EGD) WITH PROPOFOL N/A 06/27/2016   Procedure: ESOPHAGOGASTRODUODENOSCOPY (EGD) WITH PROPOFOL;  Surgeon: Jonathon Bellows, MD;  Location: [facility] ENDOSCOPY;  Service: Endoscopy;  Laterality: N/A;   Social History:  reports that she has been smoking cigarettes.  She has a 20.00 pack-year smoking history. she has never used smokeless tobacco. She reports that she drinks alcohol. She reports that she does not use drugs.   Family History  Problem Relation Age of Onset  . Hypothyroidism Mother   . Coronary artery disease Mother   . Throat cancer Mother   . Diabetes Maternal Grandmother      No Known Allergies   Prior to Admission medications   Medication Sig Start Date End Date Taking? Authorizing Provider  albuterol (PROVENTIL HFA;VENTOLIN HFA) 108 (90 Base) MCG/ACT inhaler Inhale 1-2 puffs into the lungs every 6 (six) hours as needed for wheezing or shortness of breath.   Yes [provider]  albuterol (PROVENTIL) (2.5 MG/3ML) 0.083% nebulizer solution Take 3 mLs (2.5 mg total) by nebulization every 6 (six) hours as needed for wheezing or shortness of breath. For asthma 12/07/16  Yes Sheikh, Omair Latif, DO  FLUoxetine (PROZAC) 40 MG capsule Take 40 mg by mouth daily. 08/05/16  Yes [provider]  Fluticasone-Salmeterol (ADVAIR) 500-50 MCG/DOSE AEPB Inhale 2 puffs into the lungs 2 (two) times  daily.   Yes [provider]  ipratropium (ATROVENT) 0.02 % nebulizer solution Inhale 0.5 mg into the lungs 4 (four) times daily as needed.    Yes [provider]  ipratropium-albuterol (DUONEB) 0.5-2.5 (3) MG/3ML SOLN Inhale 3 mLs  into the lungs every 6 (six) hours as needed (for breathing).  08/14/16  Yes [provider]  levothyroxine (SYNTHROID, LEVOTHROID) 137 MCG tablet Take 137 mcg by mouth daily. 11/22/16  Yes [provider]  pantoprazole (PROTONIX) 40 MG tablet Take 1 tablet by mouth daily. 03/19/17  Yes [provider]  sucralfate (CARAFATE) 1 g tablet Take 1 tablet by mouth 4 (four) times daily as needed. 03/19/17  Yes [provider]  varenicline (CHANTIX) 1 MG tablet Take 1 mg by mouth daily.   Yes [provider]  montelukast (SINGULAIR) 10 MG tablet Take 10 mg by mouth at bedtime.    [provider]    Review of Systems:  Constitutional:  No weight loss, night sweats, Fevers, chills, fatigue.  Head&Eyes: No headache.  No vision loss.  No eye pain or scotoma ENT:  No Difficulty swallowing,Tooth/dental problems,Sore throat,  No ear ache, post nasal drip,  Cardio-vascular:  No Orthopnea, PND, swelling in lower extremities,  dizziness, palpitations  GI:  No  abdominal pain, nausea, vomiting, diarrhea, loss of appetite, hematochezia, melena, heartburn, indigestion, Resp:   No coughing up of blood .Marland KitchenNo chest wall deformity  Skin:  no rash or lesions.  GU:  no dysuria, change in color of urine, no urgency or frequency. No flank pain.  Musculoskeletal:  No joint pain or swelling. No decreased range of motion. No back pain.  Psych:  No change in mood or affect. No depression or anxiety. Neurologic: No headache, no dysesthesia, no focal weakness, no vision loss. No syncope  Physical Exam: Vitals:   04/29/17 1148 04/29/17 1230 04/29/17 1320 04/29/17 1330  BP:  122/67  (!) 100/55  Pulse:  (!) 103  (!) 101  Resp:  (!) 21  14  Temp:      TempSrc:      SpO2: 91% 100% 94% 100%  Weight:      Height:       General:  A&O x 3, NAD, nontoxic, pleasant/cooperative Head/Eye: No conjunctival hemorrhage, no icterus, [place]/AT, No nystagmus ENT:  No icterus,  No  thrush, good dentition, no pharyngeal exudate Neck:  No masses, no lymphadenpathy, no bruits CV:  RRR, no rub, no gallop, no S3 Lung: Bilateral expiratory wheeze.  Bilateral rales.  Good air movement. Abdomen: soft/NT, +BS, nondistended, no peritoneal signs Ext: No cyanosis, No rashes, No petechiae, No lymphangitis, No edema Neuro: CNII-XII intact, strength 4/5 in bilateral upper and lower extremities, no dysmetria  Labs on Admission:  Basic Metabolic Panel: Recent Labs  Lab 04/29/17 1147  NA 138  K 3.7  CL 105  CO2 19*  GLUCOSE 76  BUN 9  CREATININE 0.65  CALCIUM 9.4   Liver Function Tests: No results for input(s): AST, ALT, ALKPHOS, BILITOT, PROT, ALBUMIN in the last 168 hours. No results for input(s): LIPASE, AMYLASE in the last 168 hours. No results for input(s): AMMONIA in the last 168 hours. CBC: Recent Labs  Lab 04/29/17 1147  WBC 11.1*  NEUTROABS 7.3  HGB 12.9  HCT 41.3  MCV 86.8  PLT 260   Coagulation Profile: No results for input(s): INR, PROTIME in the last 168 hours. Cardiac Enzymes: Recent Labs  Lab 04/29/17 1147  TROPONINI <0.03  BNP: Invalid input(s): POCBNP CBG: No results for input(s): GLUCAP in the last 168 hours. Urine analysis:    Component Value Date/Time   COLORURINE STRAW (A) 05/10/2016 1227   APPEARANCEUR CLEAR (A) 05/10/2016 1227   APPEARANCEUR Hazy 05/10/2013 1216   LABSPEC 1.001 (L) 05/10/2016 1227   LABSPEC 1.014 05/10/2013 1216   PHURINE 8.0 05/10/2016 1227   GLUCOSEU NEGATIVE 05/10/2016 1227   GLUCOSEU Negative 05/10/2013 1216   HGBUR SMALL (A) 05/10/2016 1227   BILIRUBINUR NEGATIVE 05/10/2016 1227   BILIRUBINUR Negative 05/10/2013 1216   KETONESUR NEGATIVE 05/10/2016 1227   PROTEINUR NEGATIVE 05/10/2016 1227   UROBILINOGEN 0.2 09/10/2011 0250   NITRITE NEGATIVE 05/10/2016 1227   LEUKOCYTESUR NEGATIVE 05/10/2016 1227   LEUKOCYTESUR Negative 05/10/2013 1216   Sepsis  Labs: @LABRCNTIP (procalcitonin:4,lacticidven:4) )No results found for this or any previous visit (from the past 240 hour(s)).   Radiological Exams on Admission: Dg Chest Portable 1 View  Result Date: 04/29/2017 CLINICAL DATA:  Patient with shortness of breath. EXAM: PORTABLE CHEST 1 VIEW COMPARISON:  Chest radiograph 12/05/2016. FINDINGS: Monitoring leads overlie the patient. Stable cardiac and mediastinal contours. Basilar atelectasis. No consolidative pulmonary opacities. No pleural effusion or pneumothorax. IMPRESSION: No acute cardiopulmonary process.  Basilar atelectasis. Electronically Signed   By: Lovey Newcomer [name]   On: 04/29/2017 12:24    EKG: Independently reviewed.  Sinus rhythm, nonspecific T wave change    Time spent:60 minutes Code Status:   FULL Family Communication:  No Family at bedside Disposition Plan: expect 2-3 day hospitalization Consults called: none DVT Prophylaxis: [place] Lovenox  Orson Eva, DO  [facility] Pager [phone]  If 7PM-7AM, please contact night-coverage [email or web address] Password [email or web address] 04/29/2017, 2:06 PM

## 2017-04-29 NOTE — ED Triage Notes (Signed)
Ems reports pt c/o sob x 1 1/2hours and chest throbbing.  Has used neb and inhaler  Without relief.  EMS gave albuterol neb and she became nauseated and started having headache.

## 2017-04-30 LAB — BASIC METABOLIC PANEL
ANION GAP: 9 (ref 5–15)
BUN: 20 mg/dL (ref 6–20)
CHLORIDE: 102 mmol/L (ref 101–111)
CO2: 22 mmol/L (ref 22–32)
Calcium: 8.9 mg/dL (ref 8.9–10.3)
Creatinine, Ser: 0.8 mg/dL (ref 0.44–1.00)
GFR calc Af Amer: >60 mL/min (ref >60)
GLUCOSE: 127 mg/dL — AB (ref 65–99)
POTASSIUM: 3.9 mmol/L (ref 3.5–5.1)
SODIUM: 133 mmol/L — AB (ref 135–145)

## 2017-04-30 LAB — TROPONIN I: Troponin I: <0.03 ng/mL (ref <0.03)

## 2017-04-30 LAB — MAGNESIUM: Magnesium: 1.7 mg/dL (ref 1.7–2.4)

## 2017-04-30 LAB — RESPIRATORY PANEL BY PCR
ADENOVIRUS-RVPPCR: NOT DETECTED
Bordetella pertussis: NOT DETECTED
CORONAVIRUS NL63-RVPPCR: NOT DETECTED
CORONAVIRUS OC43-RVPPCR: NOT DETECTED
Chlamydophila pneumoniae: NOT DETECTED
Coronavirus 229E: NOT DETECTED
Coronavirus HKU1: NOT DETECTED
INFLUENZA A-RVPPCR: NOT DETECTED
INFLUENZA B-RVPPCR: NOT DETECTED
METAPNEUMOVIRUS-RVPPCR: NOT DETECTED
Mycoplasma pneumoniae: NOT DETECTED
PARAINFLUENZA VIRUS 1-RVPPCR: NOT DETECTED
PARAINFLUENZA VIRUS 3-RVPPCR: NOT DETECTED
Parainfluenza Virus 2: NOT DETECTED
Parainfluenza Virus 4: NOT DETECTED
RESPIRATORY SYNCYTIAL VIRUS-RVPPCR: NOT DETECTED
RHINOVIRUS / ENTEROVIRUS - RVPPCR: NOT DETECTED

## 2017-04-30 LAB — CBC
HEMATOCRIT: 37.4 % (ref 36.0–46.0)
HEMOGLOBIN: 11.8 g/dL — AB (ref 12.0–15.0)
MCH: 27.6 pg (ref 26.0–34.0)
MCHC: 31.6 g/dL (ref 30.0–36.0)
MCV: 87.6 fL (ref 78.0–100.0)
Platelets: 269 10*3/uL (ref 150–400)
RBC: 4.27 MIL/uL (ref 3.87–5.11)
RDW: 17.2 % — ABNORMAL HIGH (ref 11.5–15.5)
WBC: 6.8 10*3/uL (ref 4.0–10.5)

## 2017-04-30 MED ORDER — LEVALBUTEROL HCL 1.25 MG/0.5ML IN NEBU: 1.2500 mg | INHALATION_SOLUTION | Freq: Four times a day (QID) | RESPIRATORY_TRACT | Status: DC

## 2017-04-30 MED ORDER — PROCHLORPERAZINE EDISYLATE 5 MG/ML IJ SOLN
5.0000 mg | Freq: Once | INTRAMUSCULAR | Status: AC
  Administered 2017-04-30: 5 mg via INTRAVENOUS
  Filled 2017-04-30: qty 2

## 2017-04-30 MED ORDER — DIPHENHYDRAMINE HCL 50 MG/ML IJ SOLN
12.5000 mg | Freq: Once | INTRAMUSCULAR | Status: AC
Start: 2017-04-30 — End: 2017-04-30
  Administered 2017-04-30: 12.5 mg via INTRAVENOUS
  Filled 2017-04-30: qty 1

## 2017-04-30 MED ORDER — LEVALBUTEROL HCL 1.25 MG/0.5ML IN NEBU
INHALATION_SOLUTION | RESPIRATORY_TRACT | Status: AC
  Filled 2017-04-30: qty 0.5

## 2017-04-30 NOTE — Progress Notes (Signed)
PROGRESS NOTE  [name] YKD:[id number] DOB: 1975-08-23 DOA: 04/29/2017 PCP: Alvester Chou, NP  Brief History:  42 y.o. female with medical history of COPD, hypothyroidism, depression presenting with 1 day history of shortness of breath that began on 04/28/2017.  Patient began complaining of sharp substernal pain that is been constant since that period of time.  She has had some chest congestion and nonproductive cough without hemoptysis.  She denies any recent sick contacts or long travels.  In the emergency department, the patient was afebrile hemodynamically stable saturating 80% on room air. BMP and CBC were essentially unremarkable.  Influenza PCR was negative.  EKG shows sinus rhythm with nonspecific T wave changes.  Chest x-ray showed bibasilar atelectasis without consolidation.  The patient was started on Solu-Medrol and bronchodilators.   Assessment/Plan: Acute on chronic respiratory failure with hypoxia -Secondary to COPD exacerbation -on 3L at night at home -presently on 3L  -Wean oxygen for saturation greater than 92%  COPD exacerbation -Viral respiratory panel--pending -Continue Pulmicort -Continue IV steroids -Continue bronchodilators  Atypical chest pain -Cycle troponins--neg x 3 -Chest x-ray negative -EKG without concerning ischemic changes  Tobacco abuse -Tobacco cessation discussed -She has approximately 40-pack-year history  Hypothyroidism -Continue Synthroid  Depression -Continue fluoxetine    Disposition Plan:   Home in 1-2 days  Family Communication:  No Family at bedside  Consultants:  none  Code Status:  FULL   DVT Prophylaxis:  [place] Lovenox   Procedures: As Listed in Progress Note Above  Antibiotics: None    Subjective: Patient states that she has breathing better.  She remains short of breath going to the bathroom.  She continues to have a cough without any hemoptysis or sputum.  She denies any nausea, vomiting,  diarrhea, abdominal pain, dysuria, hematuria.  Objective: Vitals:   04/30/17 0243 04/30/17 0530 04/30/17 0906 04/30/17 1555  BP:  (!) 100/59    Pulse:  80    Resp:  16    Temp:  98 F (36.7 C)    TempSrc:  Oral    SpO2: 96% 97% 90% 96%  Weight:      Height:        Intake/Output Summary (Last 24 hours) at 04/30/2017 1648 Last data filed at 04/30/2017 0100 Gross per 24 hour  Intake 240 ml  Output -  Net 240 ml   Weight change:  Exam:   General:  Pt is alert, follows commands appropriately, not in acute distress  HEENT: No icterus, No thrush, No neck mass, [place]/AT  Cardiovascular: RRR, S1/S2, no rubs, no gallops  Respiratory: Bilateral scattered rhonchi.  Bilateral wheezing.  Good air movement.  Abdomen: Soft/+BS, non tender, non distended, no guarding  Extremities: No edema, No lymphangitis, No petechiae, No rashes, no synovitis   Data Reviewed: I have personally reviewed following labs and imaging studies Basic Metabolic Panel: Recent Labs  Lab 04/29/17 1147 04/30/17 0323  NA 138 133*  K 3.7 3.9  CL 105 102  CO2 19* 22  GLUCOSE 76 127*  BUN 9 20  CREATININE 0.65 0.80  CALCIUM 9.4 8.9  MG  --  1.7   Liver Function Tests: No results for input(s): AST, ALT, ALKPHOS, BILITOT, PROT, ALBUMIN in the last 168 hours. No results for input(s): LIPASE, AMYLASE in the last 168 hours. No results for input(s): AMMONIA in the last 168 hours. Coagulation Profile: No results for input(s): INR, PROTIME in the last 168 hours. CBC:  Recent Labs  Lab 04/29/17 1147 04/30/17 0323  WBC 11.1* 6.8  NEUTROABS 7.3  --   HGB 12.9 11.8*  HCT 41.3 37.4  MCV 86.8 87.6  PLT 260 269   Cardiac Enzymes: Recent Labs  Lab 04/29/17 1147 04/29/17 2025 04/30/17 0322  TROPONINI <0.03 <0.03 <0.03   BNP: Invalid input(s): POCBNP CBG: No results for input(s): GLUCAP in the last 168 hours. HbA1C: No results for input(s): HGBA1C in the last 72 hours. Urine analysis:    Component  Value Date/Time   COLORURINE STRAW (A) 05/10/2016 1227   APPEARANCEUR CLEAR (A) 05/10/2016 1227   APPEARANCEUR Hazy 05/10/2013 1216   LABSPEC 1.001 (L) 05/10/2016 1227   LABSPEC 1.014 05/10/2013 1216   PHURINE 8.0 05/10/2016 1227   GLUCOSEU NEGATIVE 05/10/2016 1227   GLUCOSEU Negative 05/10/2013 1216   HGBUR SMALL (A) 05/10/2016 1227   BILIRUBINUR NEGATIVE 05/10/2016 1227   BILIRUBINUR Negative 05/10/2013 1216   KETONESUR NEGATIVE 05/10/2016 1227   PROTEINUR NEGATIVE 05/10/2016 1227   UROBILINOGEN 0.2 09/10/2011 0250   NITRITE NEGATIVE 05/10/2016 1227   LEUKOCYTESUR NEGATIVE 05/10/2016 1227   LEUKOCYTESUR Negative 05/10/2013 1216   Sepsis Labs: @LABRCNTIP (procalcitonin:4,lacticidven:4) )No results found for this or any previous visit (from the past 240 hour(s)).   Scheduled Meds: . budesonide (PULMICORT) nebulizer solution  0.5 mg Nebulization BID  . enoxaparin (LOVENOX) injection  40 mg Subcutaneous Q24H  . FLUoxetine  40 mg Oral Daily  . ipratropium  0.5 mg Nebulization Q6H  . levalbuterol  1.25 mg Nebulization Q6H  . levothyroxine  137 mcg Oral QAC breakfast  . methylPREDNISolone (SOLU-MEDROL) injection  60 mg Intravenous Q6H  . montelukast  10 mg Oral QHS  . pantoprazole  40 mg Oral Daily   Continuous Infusions:  Procedures/Studies: Dg Chest Portable 1 View  Result Date: 04/29/2017 CLINICAL DATA:  Patient with shortness of breath. EXAM: PORTABLE CHEST 1 VIEW COMPARISON:  Chest radiograph 12/05/2016. FINDINGS: Monitoring leads overlie the patient. Stable cardiac and mediastinal contours. Basilar atelectasis. No consolidative pulmonary opacities. No pleural effusion or pneumothorax. IMPRESSION: No acute cardiopulmonary process.  Basilar atelectasis. Electronically Signed   By: Lovey Newcomer [name]   On: 04/29/2017 12:24    Kari Eva, DO  [facility] Pager [phone]  If 7PM-7AM, please contact night-coverage [email or web address] Password [email or web address] 04/30/2017, 4:48 PM    LOS: 1 day

## 2017-05-01 MED ORDER — PROCHLORPERAZINE EDISYLATE 5 MG/ML IJ SOLN
5.0000 mg | Freq: Once | INTRAMUSCULAR | Status: AC
  Administered 2017-05-01: 5 mg via INTRAVENOUS
  Filled 2017-05-01: qty 2

## 2017-05-01 MED ORDER — LEVALBUTEROL HCL 1.25 MG/0.5ML IN NEBU
INHALATION_SOLUTION | RESPIRATORY_TRACT | Status: AC
  Filled 2017-05-01: qty 0.5

## 2017-05-01 MED ORDER — DIPHENHYDRAMINE HCL 50 MG/ML IJ SOLN
12.5000 mg | Freq: Once | INTRAMUSCULAR | Status: AC
  Administered 2017-05-01: 12.5 mg via INTRAVENOUS
  Filled 2017-05-01: qty 1

## 2017-05-01 MED ORDER — LEVALBUTEROL HCL 0.63 MG/3ML IN NEBU
1.2500 mg | INHALATION_SOLUTION | Freq: Four times a day (QID) | RESPIRATORY_TRACT | Status: DC
  Administered 2017-05-01: 1.25 mg via RESPIRATORY_TRACT
  Filled 2017-05-01 (×4): qty 6

## 2017-05-01 NOTE — Progress Notes (Signed)
PROGRESS NOTE  [name] HTD:[id number] DOB: [birth year] DOA: 04/29/2017 PCP: Alvester Chou, NP  Brief History:  42 y.o.femalewith medical history ofCOPD, hypothyroidism, depression presenting with 1 day history of shortness of breath that began on 04/28/2017. Patient began complaining of sharp substernal pain that is been constant since that period of time. She has had some chest congestion and nonproductive cough without hemoptysis. She denies any recent sick contacts or long travels. In the emergency department, the patient was afebrile hemodynamically stable saturating 80% on room air. BMP and CBC were essentially unremarkable. Influenza PCR was negative. EKG shows sinus rhythm with nonspecific T wave changes. Chest x-ray showed bibasilar atelectasis without consolidation. The patient was started on Solu-Medrol and bronchodilators.   Assessment/Plan: Acute on chronic respiratory failure with hypoxia -Secondary to COPD exacerbation -on 3L at night at home -presently on 2L  -Wean oxygen for saturation greater than 92% -ambulatory pulse ox prior to d/c to determine if needs oxygen 24/7  COPD exacerbation -Viral respiratory panel--neg -Continue Pulmicort -Continue IV steroids -Continue bronchodilators  Atypical chest pain -Cycle troponins--neg x 3 -Chest x-ray negative -EKG without concerning ischemic changes  Tobacco abuse -Tobacco cessation discussed -She has approximately 40-pack-year history  Hypothyroidism -Continue Synthroid  Depression -Continue fluoxetine    Disposition Plan:   Home 3/20 if stable Family Communication:  No Family at bedside  Consultants:  none  Code Status:  FULL   DVT Prophylaxis:  [place] Lovenox   Procedures: As Listed in Progress Note Above  Antibiotics: None     Subjective: Pt is breathing better, but remains a bit sob with ambulation.  Denies cp, n/v/d, abd pain, f/c.  C/o HA without  focal deficits  Objective: Vitals:   05/01/17 0528 05/01/17 0851 05/01/17 0900 05/01/17 1519  BP: (!) 112/58     Pulse: 70     Resp: 16     Temp: 98 F (36.7 C)     TempSrc: Oral     SpO2: 97% 95% 98% 93%  Weight:      Height:        Intake/Output Summary (Last 24 hours) at 05/01/2017 1743 Last data filed at 05/01/2017 0900 Gross per 24 hour  Intake 480 ml  Output -  Net 480 ml   Weight change:  Exam:   General:  Pt is alert, follows commands appropriately, not in acute distress  HEENT: No icterus, No thrush, No neck mass, [place]/AT  Cardiovascular: RRR, S1/S2, no rubs, no gallops  Respiratory: bibasilar rales, basilar wheeze  Abdomen: Soft/+BS, non tender, non distended, no guarding  Extremities: No edema, No lymphangitis, No petechiae, No rashes, no synovitis   Data Reviewed: I have personally reviewed following labs and imaging studies Basic Metabolic Panel: Recent Labs  Lab 04/29/17 1147 04/30/17 0323  NA 138 133*  K 3.7 3.9  CL 105 102  CO2 19* 22  GLUCOSE 76 127*  BUN 9 20  CREATININE 0.65 0.80  CALCIUM 9.4 8.9  MG  --  1.7   Liver Function Tests: No results for input(s): AST, ALT, ALKPHOS, BILITOT, PROT, ALBUMIN in the last 168 hours. No results for input(s): LIPASE, AMYLASE in the last 168 hours. No results for input(s): AMMONIA in the last 168 hours. Coagulation Profile: No results for input(s): INR, PROTIME in the last 168 hours. CBC: Recent Labs  Lab 04/29/17 1147 04/30/17 0323  WBC 11.1* 6.8  NEUTROABS 7.3  --   HGB 12.9  11.8*  HCT 41.3 37.4  MCV 86.8 87.6  PLT 260 269   Cardiac Enzymes: Recent Labs  Lab 04/29/17 1147 04/29/17 2025 04/30/17 0322  TROPONINI <0.03 <0.03 <0.03   BNP: Invalid input(s): POCBNP CBG: No results for input(s): GLUCAP in the last 168 hours. HbA1C: No results for input(s): HGBA1C in the last 72 hours. Urine analysis:    Component Value Date/Time   COLORURINE STRAW (A) 05/10/2016 1227    APPEARANCEUR CLEAR (A) 05/10/2016 1227   APPEARANCEUR Hazy 05/10/2013 1216   LABSPEC 1.001 (L) 05/10/2016 1227   LABSPEC 1.014 05/10/2013 1216   PHURINE 8.0 05/10/2016 1227   GLUCOSEU NEGATIVE 05/10/2016 1227   GLUCOSEU Negative 05/10/2013 1216   HGBUR SMALL (A) 05/10/2016 1227   BILIRUBINUR NEGATIVE 05/10/2016 1227   BILIRUBINUR Negative 05/10/2013 1216   KETONESUR NEGATIVE 05/10/2016 1227   PROTEINUR NEGATIVE 05/10/2016 1227   UROBILINOGEN 0.2 09/10/2011 0250   NITRITE NEGATIVE 05/10/2016 1227   LEUKOCYTESUR NEGATIVE 05/10/2016 1227   LEUKOCYTESUR Negative 05/10/2013 1216   Sepsis Labs: @LABRCNTIP (procalcitonin:4,lacticidven:4) ) Recent Results (from the past 240 hour(s))  Respiratory Panel by PCR     Status: None   Collection Time: 04/30/17  5:22 AM  Result Value Ref Range Status   Adenovirus NOT DETECTED NOT DETECTED Final   Coronavirus 229E NOT DETECTED NOT DETECTED Final   Coronavirus HKU1 NOT DETECTED NOT DETECTED Final   Coronavirus NL63 NOT DETECTED NOT DETECTED Final   Coronavirus OC43 NOT DETECTED NOT DETECTED Final   Metapneumovirus NOT DETECTED NOT DETECTED Final   Rhinovirus / Enterovirus NOT DETECTED NOT DETECTED Final   Influenza A NOT DETECTED NOT DETECTED Final   Influenza B NOT DETECTED NOT DETECTED Final   Parainfluenza Virus 1 NOT DETECTED NOT DETECTED Final   Parainfluenza Virus 2 NOT DETECTED NOT DETECTED Final   Parainfluenza Virus 3 NOT DETECTED NOT DETECTED Final   Parainfluenza Virus 4 NOT DETECTED NOT DETECTED Final   Respiratory Syncytial Virus NOT DETECTED NOT DETECTED Final   Bordetella pertussis NOT DETECTED NOT DETECTED Final   Chlamydophila pneumoniae NOT DETECTED NOT DETECTED Final   Mycoplasma pneumoniae NOT DETECTED NOT DETECTED Final    Comment: Performed at [facility], [place] [address] [place], [place] [address]     Scheduled Meds: . budesonide (PULMICORT) nebulizer solution  0.5 mg Nebulization BID  . diphenhydrAMINE  12.5  mg Intravenous Once  . enoxaparin (LOVENOX) injection  40 mg Subcutaneous Q24H  . FLUoxetine  40 mg Oral Daily  . ipratropium  0.5 mg Nebulization Q6H  . levalbuterol  1.25 mg Nebulization Q6H  . levothyroxine  137 mcg Oral QAC breakfast  . methylPREDNISolone (SOLU-MEDROL) injection  60 mg Intravenous Q6H  . montelukast  10 mg Oral QHS  . pantoprazole  40 mg Oral Daily  . prochlorperazine  5 mg Intravenous Once   Continuous Infusions:  Procedures/Studies: Dg Chest Portable 1 View  Result Date: 04/29/2017 CLINICAL DATA:  Patient with shortness of breath. EXAM: PORTABLE CHEST 1 VIEW COMPARISON:  Chest radiograph 12/05/2016. FINDINGS: Monitoring leads overlie the patient. Stable cardiac and mediastinal contours. Basilar atelectasis. No consolidative pulmonary opacities. No pleural effusion or pneumothorax. IMPRESSION: No acute cardiopulmonary process.  Basilar atelectasis. Electronically Signed   By: Lovey Newcomer [name]   On: 04/29/2017 12:24    Orson Eva, DO  [facility] Pager [phone]  If 7PM-7AM, please contact night-coverage [email or web address] Password [email or web address] 05/01/2017, 5:43 PM   LOS: 2 days

## 2017-05-02 DIAGNOSIS — E032 Hypothyroidism due to medicaments and other exogenous substances: Secondary | ICD-10-CM

## 2017-05-02 DIAGNOSIS — J441 Chronic obstructive pulmonary disease with (acute) exacerbation: Principal | ICD-10-CM

## 2017-05-02 DIAGNOSIS — J9621 Acute and chronic respiratory failure with hypoxia: Secondary | ICD-10-CM

## 2017-05-02 MED ORDER — LEVALBUTEROL HCL 0.63 MG/3ML IN NEBU
0.6300 mg | INHALATION_SOLUTION | Freq: Four times a day (QID) | RESPIRATORY_TRACT | Status: DC
  Administered 2017-05-02: 0.63 mg via RESPIRATORY_TRACT

## 2017-05-02 MED ORDER — ALBUTEROL SULFATE HFA 108 (90 BASE) MCG/ACT IN AERS: 1.0000 | INHALATION_SPRAY | Freq: Four times a day (QID) | RESPIRATORY_TRACT | 2 refills | Status: DC | PRN

## 2017-05-02 MED ORDER — PREDNISONE 10 MG PO TABS: 10.0000 mg | ORAL_TABLET | Freq: Every day | ORAL | 0 refills | Status: DC

## 2017-05-02 NOTE — Discharge Summary (Signed)
Physician Discharge Summary  [name] OBS:[id number] DOB: [birth year] DOA: 04/29/2017  PCP: Alvester Chou, NP  Admit date: 04/29/2017 Discharge date: 05/02/2017  Time spent: 45 minutes  Recommendations for Outpatient Follow-up:  -To be discharged home today. -Advise follow-up with PCP in 2 weeks. -Will need to complete prednisone taper.  Discharge Diagnoses:  Active Problems:   Tobacco abuse   Hypothyroidism   Acute on chronic respiratory failure with hypoxia (HCC)   COPD with acute exacerbation (HCC)   Acute respiratory failure with hypoxia [facility])   Discharge Condition: Stable and improved  Filed Weights   04/29/17 1136 04/29/17 2057  Weight: 95.3 kg (210 lb) 84.1 kg (185 lb 8 oz)    History of present illness:  As per Dr. Carles Collet on 3/17: [name] is a 42 y.o. female with medical history of COPD, hypothyroidism, depression presenting with 1 day history of shortness of breath that began on 04/28/2017.  Patient began complaining of sharp substernal pain that is been constant since that period of time.  She denies any dizziness, syncope, diaphoresis.  She has not had any fevers, chills, vomiting, diarrhea, abdominal pain, dysuria, headache, neck pain.  She has had some chest congestion and nonproductive cough without hemoptysis.  She denies any recent sick contacts or long travels.  In the emergency department, the patient was afebrile hemodynamically stable saturating 80% on room air. BMP and CBC were essentially unremarkable.  Influenza PCR was negative.  EKG shows sinus rhythm with nonspecific T wave changes.  Chest x-ray showed bibasilar atelectasis without consolidation.  The patient was started on Solu-Medrol and bronchodilators.    Hospital Course:   Acute on chronic hypoxemic respiratory failure -Due to COPD with acute exacerbation -Chronically uses 3 L of oxygen and has been maintaining adequate oxygen saturations on 2-3 L of oxygen.  COPD with acute  exacerbation -Will be discharged on prednisone taper, continue bronchodilators, respiratory viral panel has resulted negative, influenza is negative as well. -No wheezing on chest auscultation on discharge.  Tobacco abuse -Cessation counseling has been provided.  Hypothyroidism -Continue Synthroid  Depression -Continue fluoxetine.  Procedures:  None  Consultations:  None  Discharge Instructions  Discharge Instructions    Diet - low sodium heart healthy   Complete by:  As directed    Increase activity slowly   Complete by:  As directed      Allergies as of 05/02/2017   No Known Allergies     Medication List    STOP taking these medications   ipratropium-albuterol 0.5-2.5 (3) MG/3ML Soln Commonly known as:  DUONEB     TAKE these medications   albuterol (2.5 MG/3ML) 0.083% nebulizer solution Commonly known as:  PROVENTIL Take 3 mLs (2.5 mg total) by nebulization every 6 (six) hours as needed for wheezing or shortness of breath. For asthma   albuterol 108 (90 Base) MCG/ACT inhaler Commonly known as:  PROVENTIL HFA;VENTOLIN HFA Inhale 1-2 puffs into the lungs every 6 (six) hours as needed for wheezing or shortness of breath.   FLUoxetine 40 MG capsule Commonly known as:  PROZAC Take 40 mg by mouth daily.   Fluticasone-Salmeterol 500-50 MCG/DOSE Aepb Commonly known as:  ADVAIR Inhale 2 puffs into the lungs 2 (two) times daily.   ipratropium 0.02 % nebulizer solution Commonly known as:  ATROVENT Inhale 0.5 mg into the lungs 4 (four) times daily as needed.   levothyroxine 137 MCG tablet Commonly known as:  SYNTHROID, LEVOTHROID Take 137 mcg by mouth  daily.   montelukast 10 MG tablet Commonly known as:  SINGULAIR Take 10 mg by mouth at bedtime.   pantoprazole 40 MG tablet Commonly known as:  PROTONIX Take 1 tablet by mouth daily.   predniSONE 10 MG tablet Commonly known as:  DELTASONE Take 1 tablet (10 mg total) by mouth daily with breakfast. Take 6  tablets today and then decrease by 1 tablet daily until none are left.   sucralfate 1 g tablet Commonly known as:  CARAFATE Take 1 tablet by mouth 4 (four) times daily as needed.   varenicline 1 MG tablet Commonly known as:  CHANTIX Take 1 mg by mouth daily.      No Known Allergies Follow-up Information    Alvester Chou, NP Follow up in 2 week(s).   Specialty:  Nurse Practitioner Contact information: [facility] [place] [address] [phone]            The results of significant diagnostics from this hospitalization (including imaging, microbiology, ancillary and laboratory) are listed below for reference.    Significant Diagnostic Studies: Dg Chest Portable 1 View  Result Date: 04/29/2017 CLINICAL DATA:  Patient with shortness of breath. EXAM: PORTABLE CHEST 1 VIEW COMPARISON:  Chest radiograph 12/05/2016. FINDINGS: Monitoring leads overlie the patient. Stable cardiac and mediastinal contours. Basilar atelectasis. No consolidative pulmonary opacities. No pleural effusion or pneumothorax. IMPRESSION: No acute cardiopulmonary process.  Basilar atelectasis. Electronically Signed   By: Lovey Newcomer [name]   On: 04/29/2017 12:24    Microbiology: Recent Results (from the past 240 hour(s))  Respiratory Panel by PCR     Status: None   Collection Time: 04/30/17  5:22 AM  Result Value Ref Range Status   Adenovirus NOT DETECTED NOT DETECTED Final   Coronavirus 229E NOT DETECTED NOT DETECTED Final   Coronavirus HKU1 NOT DETECTED NOT DETECTED Final   Coronavirus NL63 NOT DETECTED NOT DETECTED Final   Coronavirus OC43 NOT DETECTED NOT DETECTED Final   Metapneumovirus NOT DETECTED NOT DETECTED Final   Rhinovirus / Enterovirus NOT DETECTED NOT DETECTED Final   Influenza A NOT DETECTED NOT DETECTED Final   Influenza B NOT DETECTED NOT DETECTED Final   Parainfluenza Virus 1 NOT DETECTED NOT DETECTED Final   Parainfluenza Virus 2 NOT DETECTED NOT  DETECTED Final   Parainfluenza Virus 3 NOT DETECTED NOT DETECTED Final   Parainfluenza Virus 4 NOT DETECTED NOT DETECTED Final   Respiratory Syncytial Virus NOT DETECTED NOT DETECTED Final   Bordetella pertussis NOT DETECTED NOT DETECTED Final   Chlamydophila pneumoniae NOT DETECTED NOT DETECTED Final   Mycoplasma pneumoniae NOT DETECTED NOT DETECTED Final    Comment: Performed at [facility], [address], [place], [place] [address]     Labs: Basic Metabolic Panel: Recent Labs  Lab 04/29/17 1147 04/30/17 0323  NA 138 133*  K 3.7 3.9  CL 105 102  CO2 19* 22  GLUCOSE 76 127*  BUN 9 20  CREATININE 0.65 0.80  CALCIUM 9.4 8.9  MG  --  1.7   Liver Function Tests: No results for input(s): AST, ALT, ALKPHOS, BILITOT, PROT, ALBUMIN in the last 168 hours. No results for input(s): LIPASE, AMYLASE in the last 168 hours. No results for input(s): AMMONIA in the last 168 hours. CBC: Recent Labs  Lab 04/29/17 1147 04/30/17 0323  WBC 11.1* 6.8  NEUTROABS 7.3  --   HGB 12.9 11.8*  HCT 41.3 37.4  MCV 86.8 87.6  PLT 260 269   Cardiac Enzymes: Recent Labs  Lab 04/29/17 1147 04/29/17 2025 04/30/17 0322  TROPONINI <0.03 <0.03 <0.03   BNP: BNP (last 3 results) No results for input(s): BNP in the last 8760 hours.  ProBNP (last 3 results) No results for input(s): PROBNP in the last 8760 hours.  CBG: No results for input(s): GLUCAP in the last 168 hours.     Signed:  Lelon Frohlich  [facility] Pager: [phone] 05/02/2017, 6:41 PM

## 2017-05-02 NOTE — ED Provider Notes (Signed)
[facility] Provider Note   CSN: [id number] Arrival date & time: 04/29/17  1133     History   Chief Complaint Chief Complaint  Patient presents with  . Shortness of Breath    HPI [name] is a 42 y.o. female.  Chief complaint is shortness of breath.  HPI 42 year old female.  Short of breath the last 2 days.  Using her home nebulized albuterol.  Has oxygen at night.  More and more short of breath since midnight last night.  Called 911.  Given multiple nebulized albuterol treatments at home by herself, and in route by paramedics.  Fever.  No chest pain.  No edema.  Past Medical History:  Diagnosis Date  . Alcohol abuse   . Anemia 01/31/2011  . Asthma   . COPD (chronic obstructive pulmonary disease) ([place])   . Depression   . Hypoxia 02/01/2011  . Needs sleep apnea assessment 2013.07.31  . Tobacco abuse     Patient Active Problem List   Diagnosis Date Noted  . Acute respiratory failure with hypoxia ([place]) 04/29/2017  . Leukocytosis 12/07/2016  . Rhinovirus infection 12/07/2016  . Acute on chronic respiratory failure with hypoxia ([place]) 12/06/2016  . COPD with acute exacerbation ([place]) 12/06/2016  . Hypokalemia 02/10/2016  . Hypothyroidism 02/10/2016  . CAP (community acquired pneumonia) 01/19/2013  . Dehydration 01/19/2013  . Nausea vomiting and diarrhea 01/19/2013  . Alcohol dependence ([place]) 01/16/2012  . Alcohol withdrawal ([place]) 01/16/2012  . Hypoxia 02/01/2011  . Hyponatremia 02/01/2011  . Anemia 01/31/2011  . Chest pain, pleuritic 01/29/2011  . Back pain 01/29/2011  . HCAP (healthcare-associated pneumonia) 01/28/2011  . Acute bronchitis 01/08/2011  . COPD exacerbation ([place]) 01/08/2011  . Tobacco abuse 01/08/2011  . Polysubstance abuse ([place]) 01/08/2011  . Major depression 01/08/2011    Past Surgical History:  Procedure Laterality Date  . ANKLE GANGLION CYST EXCISION    . BACK SURGERY    . CESAREAN SECTION    . COLONOSCOPY  WITH PROPOFOL N/A 06/27/2016   Procedure: COLONOSCOPY WITH PROPOFOL;  Surgeon: Jonathon Bellows, MD;  Location: [facility] ENDOSCOPY;  Service: Endoscopy;  Laterality: N/A;  . DILATION AND CURETTAGE OF UTERUS    . ESOPHAGOGASTRODUODENOSCOPY (EGD) WITH PROPOFOL N/A 06/27/2016   Procedure: ESOPHAGOGASTRODUODENOSCOPY (EGD) WITH PROPOFOL;  Surgeon: Jonathon Bellows, MD;  Location: [facility] ENDOSCOPY;  Service: Endoscopy;  Laterality: N/A;    OB History    No data available       Home Medications    Prior to Admission medications   Medication Sig Start Date End Date Taking? Authorizing Provider  albuterol (PROVENTIL) (2.5 MG/3ML) 0.083% nebulizer solution Take 3 mLs (2.5 mg total) by nebulization every 6 (six) hours as needed for wheezing or shortness of breath. For asthma 12/07/16  Yes Sheikh, Omair Latif, DO  FLUoxetine (PROZAC) 40 MG capsule Take 40 mg by mouth daily. 08/05/16  Yes [provider]  Fluticasone-Salmeterol (ADVAIR) 500-50 MCG/DOSE AEPB Inhale 2 puffs into the lungs 2 (two) times daily.   Yes [provider]  ipratropium (ATROVENT) 0.02 % nebulizer solution Inhale 0.5 mg into the lungs 4 (four) times daily as needed.    Yes [provider]  levothyroxine (SYNTHROID, LEVOTHROID) 137 MCG tablet Take 137 mcg by mouth daily. 11/22/16  Yes [provider]  pantoprazole (PROTONIX) 40 MG tablet Take 1 tablet by mouth daily. 03/19/17  Yes [provider]  sucralfate (CARAFATE) 1 g tablet Take 1 tablet by mouth 4 (four) times daily  as needed. 03/19/17  Yes [provider]  varenicline (CHANTIX) 1 MG tablet Take 1 mg by mouth daily.   Yes [provider]  albuterol (PROVENTIL HFA;VENTOLIN HFA) 108 (90 Base) MCG/ACT inhaler Inhale 1-2 puffs into the lungs every 6 (six) hours as needed for wheezing or shortness of breath. 05/02/17   Isaac Bliss, Rayford Halsted, MD  montelukast (SINGULAIR) 10 MG tablet Take 10 mg by mouth at bedtime.    [provider]  predniSONE (DELTASONE) 10 MG tablet Take 1 tablet (10 mg total) by mouth daily with breakfast. Take 6 tablets today and then decrease by 1 tablet daily until none are left. 05/02/17   Erline Hau, MD    Family History Family History  Problem Relation Age of Onset  . Hypothyroidism Mother   . Coronary artery disease Mother   . Throat cancer Mother   . Diabetes Maternal Grandmother     Social History Social History   Tobacco Use  . Smoking status: Current Every Day Smoker    Packs/day: 1.00    Years: 20.00    Pack years: 20.00    Types: Cigarettes    Last attempt to quit: 06/19/2012    Years since quitting: 4.8  . Smokeless tobacco: Never Used  Substance Use Topics  . Alcohol use: Yes    Alcohol/week: 0.0 oz  . Drug use: No    Comment: crack-Not since September     Allergies   Patient has no known allergies.   Review of Systems Review of Systems  Constitutional: Negative for appetite change, chills, diaphoresis, fatigue and fever.  HENT: Negative for mouth sores, sore throat and trouble swallowing.   Eyes: Negative for visual disturbance.  Respiratory: Positive for shortness of breath and wheezing. Negative for cough and chest tightness.   Cardiovascular: Negative for chest pain.  Gastrointestinal: Negative for abdominal distention, abdominal pain, diarrhea, nausea and vomiting.  Endocrine: Negative for polydipsia, polyphagia and polyuria.  Genitourinary: Negative for dysuria, frequency and hematuria.  Musculoskeletal: Negative for gait problem.  Skin: Negative for color change, pallor and rash.  Neurological: Negative for dizziness, syncope, light-headedness and headaches.  Hematological: Does not bruise/bleed easily.  Psychiatric/Behavioral: Negative for behavioral problems and confusion.     Physical Exam Updated Vital Signs BP (!) 99/58 (BP Location: Left Arm)   Pulse 74   Temp 98.2 F (36.8 C) (Oral)   Resp 16   Ht 5\' 5"  (1.651 m)   Wt  84.1 kg (185 lb 8 oz)   LMP 04/15/2017 (Approximate)   SpO2 96%   BMI 30.87 kg/m   Physical Exam  Constitutional: She is oriented to person, place, and time. She appears well-developed and well-nourished. No distress.  HENT:  Head: Normocephalic.  Eyes: Conjunctivae are normal. Pupils are equal, round, and reactive to light. No scleral icterus.  Neck: Normal range of motion. Neck supple. No thyromegaly present.  Cardiovascular: Normal rate and regular rhythm. Exam reveals no gallop and no friction rub.  No murmur heard. Pulmonary/Chest: Breath sounds normal. No respiratory distress. She has no wheezes. She has no rales.  Wheezing, prolongation in all fields.  Respiratory distress.  Abdominal: Soft. Bowel sounds are normal. She exhibits no distension. There is no tenderness. There is no rebound.  Musculoskeletal: Normal range of motion.  Neurological: She is alert and oriented to person, place, and time.  Skin: Skin is warm and dry. No rash noted.  Psychiatric: She has a normal mood and affect. Her  behavior is normal.     ED Treatments / Results  Labs (all labs ordered are listed, but only abnormal results are displayed) Labs Reviewed  CBC WITH DIFFERENTIAL/PLATELET - Abnormal; Notable for the following components:      Result Value   WBC 11.1 (*)    RDW 16.8 (*)    All other components within normal limits  BASIC METABOLIC PANEL - Abnormal; Notable for the following components:   CO2 19 (*)    All other components within normal limits  BASIC METABOLIC PANEL - Abnormal; Notable for the following components:   Sodium 133 (*)    Glucose, Bld 127 (*)    All other components within normal limits  CBC - Abnormal; Notable for the following components:   Hemoglobin 11.8 (*)    RDW 17.2 (*)    All other components within normal limits  RESPIRATORY PANEL BY PCR  TROPONIN I  INFLUENZA PANEL BY PCR (TYPE A & B)  MAGNESIUM  TROPONIN I  TROPONIN I    EKG  EKG  Interpretation  Date/Time:  Sunday April 29 2017 11:38:52 EDT Ventricular Rate:  111 PR Interval:    QRS Duration: 86 QT Interval:  327 QTC Calculation: 445 R Axis:   57 Text Interpretation:  Sinus tachycardia Confirmed by Tanna Furry [id number]) on 04/29/2017 11:44:02 AM       Radiology No results found.  Procedures Procedures (including critical care time)  Medications Ordered in ED Medications  FLUoxetine (PROZAC) capsule 40 mg (40 mg Oral Given 05/02/17 0958)  levothyroxine (SYNTHROID, LEVOTHROID) tablet 137 mcg (137 mcg Oral Given 05/02/17 0958)  montelukast (SINGULAIR) tablet 10 mg (10 mg Oral Given 05/01/17 2152)  pantoprazole (PROTONIX) EC tablet 40 mg (40 mg Oral Given 05/02/17 0958)  enoxaparin (LOVENOX) injection 40 mg (40 mg Subcutaneous Given 05/01/17 2152)  methylPREDNISolone sodium succinate (SOLU-MEDROL) 125 mg/2 mL injection 60 mg (60 mg Intravenous Given 05/02/17 0956)  budesonide (PULMICORT) nebulizer solution 0.5 mg (0.5 mg Nebulization Given 05/02/17 0759)  ipratropium (ATROVENT) nebulizer solution 0.5 mg (0.5 mg Nebulization Given 05/02/17 0748)  levalbuterol (XOPENEX) nebulizer solution 0.63 mg (0.63 mg Nebulization Given 05/02/17 0252)  phenol (CHLORASEPTIC) mouth spray 1 spray (not administered)  acetaminophen (TYLENOL) tablet 650 mg (650 mg Oral Given 04/30/17 1444)  levalbuterol (XOPENEX) 1.25 MG/0.5ML nebulizer solution (1.25 mg  Canceled Entry 04/30/17 1948)  levalbuterol (XOPENEX) 1.25 MG/0.5ML nebulizer solution (  Canceled Entry 05/01/17 0318)  levalbuterol (XOPENEX) nebulizer solution 0.63 mg (0.63 mg Nebulization Given 05/02/17 0749)  albuterol (PROVENTIL,VENTOLIN) solution continuous neb (10 mg/hr Nebulization Given 04/29/17 1148)  methylPREDNISolone sodium succinate (SOLU-MEDROL) 125 mg/2 mL injection 125 mg (125 mg Intravenous Given 04/29/17 1215)  ondansetron (ZOFRAN) injection 4 mg (4 mg Intravenous Given 04/29/17 1309)  ketorolac (TORADOL) 30 MG/ML injection  30 mg (30 mg Intravenous Given 04/29/17 1309)  albuterol (PROVENTIL,VENTOLIN) solution continuous neb (10 mg/hr Nebulization Given 04/29/17 1316)  prochlorperazine (COMPAZINE) injection 5 mg (5 mg Intravenous Given 04/30/17 1618)  diphenhydrAMINE (BENADRYL) injection 12.5 mg (12.5 mg Intravenous Given 04/30/17 1618)  diphenhydrAMINE (BENADRYL) injection 12.5 mg (12.5 mg Intravenous Given 05/01/17 1748)  prochlorperazine (COMPAZINE) injection 5 mg (5 mg Intravenous Given 05/01/17 1748)     Initial Impression / Assessment and Plan / ED Course  I have reviewed the triage vital signs and the nursing notes.  Pertinent labs & imaging results that were available during my care of the patient were reviewed by me and considered in my medical decision making (see  chart for details).   Multiple nebulized albuterol treatment.  Given IV Solu-Medrol.  Clinical and radiographic data congestive heart failure.  After 1 hour had continued distress hypoxemia and tachycardia.  Given additional hour nebulized albuterol.  Improved minimally.  Discussed with hospitalist.  Will be admitted for further care.  CRITICAL CARE Performed by: Lolita Patella   Total critical care time: 121 minutes  Critical care time was exclusive of separately billable procedures and treating other patients.  Critical care was necessary to treat or prevent imminent or life-threatening deterioration.  Critical care was time spent personally by me on the following activities: development of treatment plan with patient and/or surrogate as well as nursing, discussions with consultants, evaluation of patient's response to treatment, examination of patient, obtaining history from patient or surrogate, ordering and performing treatments and interventions, ordering and review of laboratory studies, ordering and review of radiographic studies, pulse oximetry and re-evaluation of patient's condition.    Final Clinical Impressions(s) / ED  Diagnoses   Final diagnoses:  COPD exacerbation [facility])    ED Discharge Orders        Ordered    predniSONE (DELTASONE) 10 MG tablet  Daily with breakfast     05/02/17 1131    Increase activity slowly     05/02/17 1131    Diet - low sodium heart healthy     05/02/17 1131    albuterol (PROVENTIL HFA;VENTOLIN HFA) 108 (90 Base) MCG/ACT inhaler  Every 6 hours PRN     05/02/17 1238       Tanna Furry, MD 05/02/17 [phone]

## 2017-05-02 NOTE — Progress Notes (Signed)
Patient given discharge instructions at bedside, patient uses home oxygen already through [facility] care, waiting for tank, patient is ready for discharge.

## 2017-05-02 NOTE — Care Management (Signed)
Patient reports she has continuous oxygen with [facility]. Juliann Pulse of [facility] verified this and will provide portable tank for DC home. No other CM needs.

## 2017-07-12 ENCOUNTER — Ambulatory Visit (HOSPITAL_COMMUNITY)
Admission: EM | Admit: 2017-07-12 | Discharge: 2017-07-12 | Disposition: A | Discharge status: Home / Self Care | Payer: BLUE CROSS/BLUE SHIELD | Attending: Physician Assistant | Admitting: Physician Assistant

## 2017-07-12 ENCOUNTER — Encounter (HOSPITAL_COMMUNITY): Payer: Self-pay | Admitting: Family Medicine

## 2017-07-12 DIAGNOSIS — W57XXXA Bitten or stung by nonvenomous insect and other nonvenomous arthropods, initial encounter: Secondary | ICD-10-CM

## 2017-07-12 DIAGNOSIS — R21 Rash and other nonspecific skin eruption: Secondary | ICD-10-CM

## 2017-07-12 MED ORDER — HYDROXYZINE HCL 25 MG PO TABS: 25.0000 mg | ORAL_TABLET | Freq: Four times a day (QID) | ORAL | 0 refills | Status: DC

## 2017-07-12 MED ORDER — CLOBETASOL PROPIONATE 0.05 % EX OINT: 1.0000 "application " | TOPICAL_OINTMENT | Freq: Two times a day (BID) | CUTANEOUS | 0 refills | Status: DC

## 2017-07-12 NOTE — ED Provider Notes (Signed)
07/12/2017 7:02 PM   DOB: [birth year] / MRN: [id number]  SUBJECTIVE:  [name] is a 42 y.o. female presenting for multiple tick bites about the right side of neck.  She denies fever, chills, nausea, photophobia, headache, joint pain.  She has No Known Allergies.   She  has a past medical history of Alcohol abuse, Anemia (01/31/2011), Asthma, COPD (chronic obstructive pulmonary disease) ([place]), Depression, Hypoxia (02/01/2011), Needs sleep apnea assessment (2013.07.31), and Tobacco abuse.    She  reports that she has been smoking cigarettes.  She has a 20.00 pack-year smoking history. She has never used smokeless tobacco. She reports that she drinks alcohol. She reports that she does not use drugs. She  reports that she does not engage in sexual activity. The patient  has a past surgical history that includes Back surgery; Cesarean section; Dilation and curettage of uterus; Ankle ganglion cyst excision; Colonoscopy with propofol (N/A, 06/27/2016); and Esophagogastroduodenoscopy (egd) with propofol (N/A, 06/27/2016).  Her family history includes Coronary artery disease in her mother; Diabetes in her maternal grandmother; Hypothyroidism in her mother; Throat cancer in her mother.  ROS per HPI  OBJECTIVE:  BP 114/65   Pulse 70   Temp 98.4 F (36.9 C)   Resp 18   LMP 06/28/2017   SpO2 97%   Wt Readings from Last 3 Encounters:  04/29/17 185 lb 8 oz (84.1 kg)  12/06/16 185 lb 11.8 oz (84.2 kg)  08/21/16 191 lb (86.6 kg)   Temp Readings from Last 3 Encounters:  07/12/17 98.4 F (36.9 C)  05/02/17 98.2 F (36.8 C) (Oral)  12/07/16 97.8 F (36.6 C) (Oral)   BP Readings from Last 3 Encounters:  07/12/17 114/65  05/02/17 (!) 99/58  12/07/16 (!) 98/56   Pulse Readings from Last 3 Encounters:  07/12/17 70  05/02/17 74  12/07/16 80    Physical Exam  Constitutional: She is oriented to person, place, and time. She appears well-developed.  HENT:  Head:    Eyes: Pupils are  equal, round, and reactive to light. EOM are normal.  Cardiovascular: Normal rate.  Pulmonary/Chest: Effort normal.  Abdominal: She exhibits no distension.  Musculoskeletal: Normal range of motion.  Neurological: She is alert and oriented to person, place, and time. No cranial nerve deficit.  Skin: Skin is warm and dry. She is not diaphoretic.  Psychiatric: She has a normal mood and affect.  Vitals reviewed.   No results found for this or any previous visit (from the past 72 hour(s)).  No results found.  ASSESSMENT AND PLAN:   Rash and nonspecific skin eruption: She visualized the tic and did actually have to remove the tick with tweezers.  I see no retained products in any of the bites today.  She is most likely allergic to take saliva.  Starting her on a powerful topical corticosteroid and giving her hydroxyzine for itching.  Tick bite, initial encounter    Discharge Instructions     Take the hydroxyzine as needed for itching.  For the rash itself simply place the steroid ointment twice a day.  It may take a few days for the rash to fully go away.        The patient is advised to call or return to clinic if she does not see an improvement in symptoms, or to seek the care of the closest emergency department if she worsens with the above plan.   Philis Fendt, MHS, PA-C 07/12/2017 7:02 PM   Tereasa Coop, PA-C  07/12/17 1903  

## 2017-07-12 NOTE — ED Triage Notes (Addendum)
Pt here for rash to neck area. Multiple bumps with itching. She reports that 2 days ago she pulled a tick off in the same area. Denies fever, body aches.

## 2017-07-12 NOTE — Discharge Instructions (Signed)
Take the hydroxyzine as needed for itching.  For the rash itself simply place the steroid ointment twice a day.  It may take a few days for the rash to fully go away.

## 2018-02-04 ENCOUNTER — Encounter: Payer: Self-pay | Admitting: Emergency Medicine

## 2018-02-04 ENCOUNTER — Inpatient Hospital Stay
Admission: EM | Admit: 2018-02-04 | Discharge: 2018-02-05 | DRG: 190 | Disposition: A | Discharge status: Home / Self Care | Payer: BLUE CROSS/BLUE SHIELD | Attending: Internal Medicine | Admitting: Internal Medicine

## 2018-02-04 ENCOUNTER — Other Ambulatory Visit: Payer: Self-pay

## 2018-02-04 ENCOUNTER — Emergency Department: Payer: BLUE CROSS/BLUE SHIELD

## 2018-02-04 DIAGNOSIS — Z7951 Long term (current) use of inhaled steroids: Secondary | ICD-10-CM | POA: Diagnosis not present

## 2018-02-04 DIAGNOSIS — Z808 Family history of malignant neoplasm of other organs or systems: Secondary | ICD-10-CM | POA: Diagnosis not present

## 2018-02-04 DIAGNOSIS — J9620 Acute and chronic respiratory failure, unspecified whether with hypoxia or hypercapnia: Secondary | ICD-10-CM

## 2018-02-04 DIAGNOSIS — Z8249 Family history of ischemic heart disease and other diseases of the circulatory system: Secondary | ICD-10-CM

## 2018-02-04 DIAGNOSIS — J9621 Acute and chronic respiratory failure with hypoxia: Secondary | ICD-10-CM | POA: Diagnosis present

## 2018-02-04 DIAGNOSIS — Z8349 Family history of other endocrine, nutritional and metabolic diseases: Secondary | ICD-10-CM

## 2018-02-04 DIAGNOSIS — E039 Hypothyroidism, unspecified: Secondary | ICD-10-CM | POA: Diagnosis present

## 2018-02-04 DIAGNOSIS — F329 Major depressive disorder, single episode, unspecified: Secondary | ICD-10-CM | POA: Diagnosis present

## 2018-02-04 DIAGNOSIS — F17200 Nicotine dependence, unspecified, uncomplicated: Secondary | ICD-10-CM

## 2018-02-04 DIAGNOSIS — Z79899 Other long term (current) drug therapy: Secondary | ICD-10-CM

## 2018-02-04 DIAGNOSIS — Z9981 Dependence on supplemental oxygen: Secondary | ICD-10-CM | POA: Diagnosis not present

## 2018-02-04 DIAGNOSIS — Z79890 Hormone replacement therapy (postmenopausal): Secondary | ICD-10-CM

## 2018-02-04 DIAGNOSIS — Z7952 Long term (current) use of systemic steroids: Secondary | ICD-10-CM | POA: Diagnosis not present

## 2018-02-04 DIAGNOSIS — Z716 Tobacco abuse counseling: Secondary | ICD-10-CM | POA: Diagnosis not present

## 2018-02-04 DIAGNOSIS — F17210 Nicotine dependence, cigarettes, uncomplicated: Secondary | ICD-10-CM | POA: Diagnosis present

## 2018-02-04 DIAGNOSIS — J441 Chronic obstructive pulmonary disease with (acute) exacerbation: Secondary | ICD-10-CM | POA: Diagnosis present

## 2018-02-04 DIAGNOSIS — Z833 Family history of diabetes mellitus: Secondary | ICD-10-CM | POA: Diagnosis not present

## 2018-02-04 DIAGNOSIS — K219 Gastro-esophageal reflux disease without esophagitis: Secondary | ICD-10-CM | POA: Diagnosis present

## 2018-02-04 DIAGNOSIS — J45901 Unspecified asthma with (acute) exacerbation: Secondary | ICD-10-CM | POA: Diagnosis present

## 2018-02-04 DIAGNOSIS — R0602 Shortness of breath: Secondary | ICD-10-CM | POA: Diagnosis present

## 2018-02-04 LAB — CBC WITH DIFFERENTIAL/PLATELET
Abs Immature Granulocytes: 0.02 10*3/uL (ref 0.00–0.07)
Basophils Absolute: 0 10*3/uL (ref 0.0–0.1)
Basophils Relative: 0 %
Eosinophils Absolute: 0.3 10*3/uL (ref 0.0–0.5)
Eosinophils Relative: 4 %
HCT: 35.8 % — ABNORMAL LOW (ref 36.0–46.0)
Hemoglobin: 11.6 g/dL — ABNORMAL LOW (ref 12.0–15.0)
Immature Granulocytes: 0 %
Lymphocytes Relative: 26 %
Lymphs Abs: 1.8 10*3/uL (ref 0.7–4.0)
MCH: 29 pg (ref 26.0–34.0)
MCHC: 32.4 g/dL (ref 30.0–36.0)
MCV: 89.5 fL (ref 80.0–100.0)
Monocytes Absolute: 0.4 10*3/uL (ref 0.1–1.0)
Monocytes Relative: 5 %
NEUTROS PCT: 65 %
Neutro Abs: 4.5 10*3/uL (ref 1.7–7.7)
Platelets: 217 10*3/uL (ref 150–400)
RBC: 4 MIL/uL (ref 3.87–5.11)
RDW: 12.6 % (ref 11.5–15.5)
WBC: 7 10*3/uL (ref 4.0–10.5)
nRBC: 0 % (ref 0.0–0.2)

## 2018-02-04 LAB — BASIC METABOLIC PANEL
Anion gap: 7 (ref 5–15)
BUN: 10 mg/dL (ref 6–20)
CALCIUM: 8.6 mg/dL — AB (ref 8.9–10.3)
CHLORIDE: 104 mmol/L (ref 98–111)
CO2: 24 mmol/L (ref 22–32)
CREATININE: 0.72 mg/dL (ref 0.44–1.00)
GFR calc non Af Amer: >60 mL/min (ref >60)
GLUCOSE: 103 mg/dL — AB (ref 70–99)
Potassium: 3.6 mmol/L (ref 3.5–5.1)
Sodium: 135 mmol/L (ref 135–145)

## 2018-02-04 LAB — TROPONIN I: Troponin I: <0.03 ng/mL (ref <0.03)

## 2018-02-04 LAB — MRSA PCR SCREENING: MRSA by PCR: NEGATIVE

## 2018-02-04 LAB — GLUCOSE, CAPILLARY: Glucose-Capillary: 134 mg/dL — ABNORMAL HIGH (ref 70–99)

## 2018-02-04 LAB — INFLUENZA PANEL BY PCR (TYPE A & B)
INFLAPCR: NEGATIVE
Influenza B By PCR: NEGATIVE

## 2018-02-04 MED ORDER — ACETAMINOPHEN 650 MG RE SUPP: 650.0000 mg | Freq: Four times a day (QID) | RECTAL | Status: DC | PRN

## 2018-02-04 MED ORDER — IPRATROPIUM-ALBUTEROL 0.5-2.5 (3) MG/3ML IN SOLN
3.0000 mL | RESPIRATORY_TRACT | Status: DC
  Administered 2018-02-04: 3 mL via RESPIRATORY_TRACT
  Filled 2018-02-04: qty 3

## 2018-02-04 MED ORDER — BUDESONIDE 0.25 MG/2ML IN SUSP
0.2500 mg | Freq: Four times a day (QID) | RESPIRATORY_TRACT | Status: DC
  Administered 2018-02-04 – 2018-02-05 (×2): 0.25 mg via RESPIRATORY_TRACT
  Filled 2018-02-04 (×3): qty 2

## 2018-02-04 MED ORDER — METHYLPREDNISOLONE SODIUM SUCC 40 MG IJ SOLR
40.0000 mg | Freq: Two times a day (BID) | INTRAMUSCULAR | Status: DC
  Administered 2018-02-04 – 2018-02-05 (×2): 40 mg via INTRAVENOUS
  Filled 2018-02-04 (×2): qty 1

## 2018-02-04 MED ORDER — FLUOXETINE HCL 20 MG PO CAPS
40.0000 mg | ORAL_CAPSULE | Freq: Every day | ORAL | Status: DC
  Administered 2018-02-05: 40 mg via ORAL
  Filled 2018-02-04 (×2): qty 2

## 2018-02-04 MED ORDER — MOMETASONE FURO-FORMOTEROL FUM 200-5 MCG/ACT IN AERO
2.0000 | INHALATION_SPRAY | Freq: Two times a day (BID) | RESPIRATORY_TRACT | Status: DC
  Filled 2018-02-04: qty 8.8

## 2018-02-04 MED ORDER — SODIUM CHLORIDE 0.9 % IV SOLN
500.0000 mg | Freq: Once | INTRAVENOUS | Status: AC
  Administered 2018-02-04: 500 mg via INTRAVENOUS
  Filled 2018-02-04: qty 500

## 2018-02-04 MED ORDER — PANTOPRAZOLE SODIUM 40 MG PO TBEC
40.0000 mg | DELAYED_RELEASE_TABLET | Freq: Every day | ORAL | Status: DC
  Administered 2018-02-05: 40 mg via ORAL
  Filled 2018-02-04: qty 1

## 2018-02-04 MED ORDER — ENOXAPARIN SODIUM 40 MG/0.4ML ~~LOC~~ SOLN
40.0000 mg | SUBCUTANEOUS | Status: DC
  Administered 2018-02-04: 40 mg via SUBCUTANEOUS
  Filled 2018-02-04: qty 0.4

## 2018-02-04 MED ORDER — MORPHINE SULFATE (PF) 4 MG/ML IV SOLN
4.0000 mg | Freq: Once | INTRAVENOUS | Status: AC
  Administered 2018-02-04: 4 mg via INTRAVENOUS
  Filled 2018-02-04: qty 1

## 2018-02-04 MED ORDER — METHYLPREDNISOLONE SODIUM SUCC 40 MG IJ SOLR
40.0000 mg | Freq: Four times a day (QID) | INTRAMUSCULAR | Status: DC
  Administered 2018-02-04: 40 mg via INTRAVENOUS
  Filled 2018-02-04: qty 1

## 2018-02-04 MED ORDER — BISACODYL 5 MG PO TBEC: 5.0000 mg | DELAYED_RELEASE_TABLET | Freq: Every day | ORAL | Status: DC | PRN

## 2018-02-04 MED ORDER — IPRATROPIUM-ALBUTEROL 0.5-2.5 (3) MG/3ML IN SOLN
3.0000 mL | Freq: Four times a day (QID) | RESPIRATORY_TRACT | Status: DC
  Administered 2018-02-04 – 2018-02-05 (×2): 3 mL via RESPIRATORY_TRACT
  Filled 2018-02-04 (×3): qty 3

## 2018-02-04 MED ORDER — HYDROCODONE-ACETAMINOPHEN 5-325 MG PO TABS: 1.0000 | ORAL_TABLET | ORAL | Status: DC | PRN

## 2018-02-04 MED ORDER — ONDANSETRON HCL 4 MG/2ML IJ SOLN: 4.0000 mg | Freq: Four times a day (QID) | INTRAMUSCULAR | Status: DC | PRN

## 2018-02-04 MED ORDER — SODIUM CHLORIDE 0.9 % IV SOLN
INTRAVENOUS | Status: DC
  Administered 2018-02-04: 50 mL/h via INTRAVENOUS

## 2018-02-04 MED ORDER — IPRATROPIUM-ALBUTEROL 0.5-2.5 (3) MG/3ML IN SOLN
9.0000 mL | Freq: Once | RESPIRATORY_TRACT | Status: DC
Start: 2018-02-04 — End: 2018-02-04

## 2018-02-04 MED ORDER — ACETAMINOPHEN 325 MG PO TABS
650.0000 mg | ORAL_TABLET | Freq: Four times a day (QID) | ORAL | Status: DC | PRN
  Administered 2018-02-04: 650 mg via ORAL
  Filled 2018-02-04: qty 2

## 2018-02-04 MED ORDER — LEVOTHYROXINE SODIUM 137 MCG PO TABS
137.0000 ug | ORAL_TABLET | ORAL | Status: DC
  Administered 2018-02-05: 137 ug via ORAL
  Filled 2018-02-04 (×2): qty 1

## 2018-02-04 MED ORDER — ALBUTEROL SULFATE (2.5 MG/3ML) 0.083% IN NEBU
7.5000 mg | INHALATION_SOLUTION | Freq: Once | RESPIRATORY_TRACT | Status: AC
  Administered 2018-02-04: 7.5 mg via RESPIRATORY_TRACT
  Filled 2018-02-04: qty 9

## 2018-02-04 MED ORDER — ONDANSETRON HCL 4 MG PO TABS: 4.0000 mg | ORAL_TABLET | Freq: Four times a day (QID) | ORAL | Status: DC | PRN

## 2018-02-04 MED ORDER — TRAZODONE HCL 50 MG PO TABS: 25.0000 mg | ORAL_TABLET | Freq: Every evening | ORAL | Status: DC | PRN

## 2018-02-04 MED ORDER — MONTELUKAST SODIUM 10 MG PO TABS
10.0000 mg | ORAL_TABLET | Freq: Every day | ORAL | Status: DC
  Administered 2018-02-04: 10 mg via ORAL
  Filled 2018-02-04: qty 1

## 2018-02-04 MED ORDER — IPRATROPIUM-ALBUTEROL 0.5-2.5 (3) MG/3ML IN SOLN
3.0000 mL | RESPIRATORY_TRACT | Status: DC | PRN
  Administered 2018-02-04: 3 mL via RESPIRATORY_TRACT

## 2018-02-04 MED ORDER — DOCUSATE SODIUM 100 MG PO CAPS
100.0000 mg | ORAL_CAPSULE | Freq: Two times a day (BID) | ORAL | Status: DC
  Administered 2018-02-04: 100 mg via ORAL
  Filled 2018-02-04 (×2): qty 1

## 2018-02-04 MED ORDER — SUCRALFATE 1 G PO TABS
1.0000 g | ORAL_TABLET | Freq: Three times a day (TID) | ORAL | Status: DC
  Administered 2018-02-04 – 2018-02-05 (×3): 1 g via ORAL
  Filled 2018-02-04 (×2): qty 1

## 2018-02-04 NOTE — Progress Notes (Signed)
Increased to 12/6 due to WOB. Tol well at this time, will continue to monitor.

## 2018-02-04 NOTE — ED Notes (Signed)
Attempted report. Levada Dy RN asked to call back in 5 minutes due to giving meds in another room. Will call back in 5 minutes

## 2018-02-04 NOTE — Care Management Note (Addendum)
Case Management Note  Patient Details  Name: [name] MRN: [id number] Date of Birth: 03-Oct-1975  Subjective/Objective:    Patient admitted with COPD exacerbation.  Patient lives in [place] with her mother and her son.  Patient is independent in ADL's, drives and requires no equipment.  Patient reports she works for [employer].  Patient does have chronic O2 3L, [facility] provides her oxygen.  Patient also has nebulizer.  PCP verified as Alvester Chou NP.  Pharmacy is [facility].  Patient reports that she wants to quit smoking but the only thing that works for her is the nicotine gum and she cannot afford it.  RNCM suggested that patient call her insurance company to see if they have any quit smoking programs, she can also call 1-800- quit-now, and to check the nicoret website to see if they have any special offers.  No needs identified at this time for discharge. Doran Clay RN BSN [phone]                   Action/Plan:   Expected Discharge Date:                  Expected Discharge Plan:     In-House Referral:     Discharge planning Services     Post Acute Care Choice:    Choice offered to:     DME Arranged:    DME Agency:     HH Arranged:    HH Agency:     Status of Service:     If discussed at [place] of Stay Meetings, dates discussed:    Additional Comments:  Shelbie Hutching, RN 02/04/2018, 2:24 PM

## 2018-02-04 NOTE — ED Provider Notes (Signed)
[facility] Emergency Department Provider Note  ____________________________________________   First MD Initiated Contact with Patient 02/04/18 0815     (approximate)  I have reviewed the triage vital signs and the nursing notes.   HISTORY  Chief Complaint Shortness of Breath   HPI [name] is a 42 y.o. female with a history of asthma as well as COPD who smokes approximately 5 cigarettes/day was presented emergency department today with shortness of breath.  He says that she started having coughing yesterday.  Says that she also has chest tightness.  Called EMS today who gave 2 duo nebs and 1 albuterol treatment in route along with 2 g of magnesium and 125 mg of Solu-Medrol.  Patient says that she has presented previously like this with her asthma attacks and has been hospitalized and required BiPAP in the past.  Denies any fever.  Denies any known sick contacts.  Denies body aches.   Past Medical History:  Diagnosis Date  . Alcohol abuse   . Anemia 01/31/2011  . Asthma   . COPD (chronic obstructive pulmonary disease) ([place])   . Depression   . Hypoxia 02/01/2011  . Needs sleep apnea assessment 2013.07.31  . Tobacco abuse     Patient Active Problem List   Diagnosis Date Noted  . Acute respiratory failure with hypoxia ([place]) 04/29/2017  . Leukocytosis 12/07/2016  . Rhinovirus infection 12/07/2016  . Acute on chronic respiratory failure with hypoxia ([place]) 12/06/2016  . COPD with acute exacerbation ([place]) 12/06/2016  . Hypokalemia 02/10/2016  . Hypothyroidism 02/10/2016  . CAP (community acquired pneumonia) 01/19/2013  . Dehydration 01/19/2013  . Nausea vomiting and diarrhea 01/19/2013  . Alcohol dependence ([place]) 01/16/2012  . Alcohol withdrawal ([place]) 01/16/2012  . Hypoxia 02/01/2011  . Hyponatremia 02/01/2011  . Anemia 01/31/2011  . Chest pain, pleuritic 01/29/2011  . Back pain 01/29/2011  . HCAP (healthcare-associated pneumonia)  01/28/2011  . Acute bronchitis 01/08/2011  . COPD exacerbation ([place]) 01/08/2011  . Tobacco abuse 01/08/2011  . Polysubstance abuse ([place]) 01/08/2011  . Major depression 01/08/2011    Past Surgical History:  Procedure Laterality Date  . ANKLE GANGLION CYST EXCISION    . BACK SURGERY    . CESAREAN SECTION    . COLONOSCOPY WITH PROPOFOL N/A 06/27/2016   Procedure: COLONOSCOPY WITH PROPOFOL;  Surgeon: Jonathon Bellows, MD;  Location: [facility] ENDOSCOPY;  Service: Endoscopy;  Laterality: N/A;  . DILATION AND CURETTAGE OF UTERUS    . ESOPHAGOGASTRODUODENOSCOPY (EGD) WITH PROPOFOL N/A 06/27/2016   Procedure: ESOPHAGOGASTRODUODENOSCOPY (EGD) WITH PROPOFOL;  Surgeon: Jonathon Bellows, MD;  Location: [facility] ENDOSCOPY;  Service: Endoscopy;  Laterality: N/A;    Prior to Admission medications   Medication Sig Start Date End Date Taking? Authorizing Provider  albuterol (PROVENTIL HFA;VENTOLIN HFA) 108 (90 Base) MCG/ACT inhaler Inhale 1-2 puffs into the lungs every 6 (six) hours as needed for wheezing or shortness of breath. 05/02/17   Isaac Bliss, Rayford Halsted, MD  albuterol (PROVENTIL) (2.5 MG/3ML) 0.083% nebulizer solution Take 3 mLs (2.5 mg total) by nebulization every 6 (six) hours as needed for wheezing or shortness of breath. For asthma 12/07/16   Raiford Noble Latif, DO  clobetasol ointment (TEMOVATE) 8.50 % Apply 1 application topically 2 (two) times daily. 07/12/17   Tereasa Coop, PA-C  FLUoxetine (PROZAC) 40 MG capsule Take 40 mg by mouth daily. 08/05/16   [provider]  Fluticasone-Salmeterol (ADVAIR) 500-50 MCG/DOSE AEPB Inhale 2 puffs into the lungs 2 (two) times daily.  [provider]  hydrOXYzine (ATARAX/VISTARIL) 25 MG tablet Take 1 tablet (25 mg total) by mouth every 6 (six) hours. For itching. 07/12/17   Tereasa Coop, PA-C  ipratropium (ATROVENT) 0.02 % nebulizer solution Inhale 0.5 mg into the lungs 4 (four) times daily as needed.     [provider]  levothyroxine  (SYNTHROID, LEVOTHROID) 137 MCG tablet Take 137 mcg by mouth daily. 11/22/16   [provider]  montelukast (SINGULAIR) 10 MG tablet Take 10 mg by mouth at bedtime.    [provider]  pantoprazole (PROTONIX) 40 MG tablet Take 1 tablet by mouth daily. 03/19/17   [provider]  predniSONE (DELTASONE) 10 MG tablet Take 1 tablet (10 mg total) by mouth daily with breakfast. Take 6 tablets today and then decrease by 1 tablet daily until none are left. 05/02/17   Isaac Bliss, Rayford Halsted, MD  sucralfate (CARAFATE) 1 g tablet Take 1 tablet by mouth 4 (four) times daily as needed. 03/19/17   [provider]  varenicline (CHANTIX) 1 MG tablet Take 1 mg by mouth daily.    [provider]    Allergies Patient has no known allergies.  Family History  Problem Relation Age of Onset  . Hypothyroidism Mother   . Coronary artery disease Mother   . Throat cancer Mother   . Diabetes Maternal Grandmother     Social History Social History   Tobacco Use  . Smoking status: Current Every Day Smoker    Packs/day: 1.00    Years: 20.00    Pack years: 20.00    Types: Cigarettes    Last attempt to quit: 06/19/2012    Years since quitting: 5.6  . Smokeless tobacco: Never Used  Substance Use Topics  . Alcohol use: Yes  . Drug use: No    Types: "Crack" cocaine    Comment: crack-Not since September    Review of Systems  Constitutional: No fever/chills Eyes: No visual changes. ENT: No sore throat. Cardiovascular: As above Respiratory: As above Gastrointestinal: No abdominal pain.  No nausea, no vomiting.  No diarrhea.  No constipation. Genitourinary: Negative for dysuria. Musculoskeletal: Negative for back pain. Skin: Negative for rash. Neurological: Negative for headaches, focal weakness or numbness.   ____________________________________________   PHYSICAL EXAM:  VITAL SIGNS: ED Triage Vitals  Enc Vitals Group     BP 02/04/18 0802 (!) 106/51      Pulse Rate 02/04/18 0802 (!) 115     Resp 02/04/18 0802 (!) 34     Temp 02/04/18 0802 98 F (36.7 C)     Temp Source 02/04/18 0802 Axillary     SpO2 02/04/18 0802 95 %     Weight 02/04/18 0800 210 lb (95.3 kg)     Height 02/04/18 0800 5\' 5"  (1.651 m)     Head Circumference --      Peak Flow --      Pain Score 02/04/18 0759 7     Pain Loc --      Pain Edu? --      Excl. in [place]? --     Constitutional: Alert and oriented.  Patient on albuterol mask upon entrance.  Positive for tachypnea as well as labored respirations. Eyes: Conjunctivae are normal.  Head: Atraumatic. Nose: No congestion/rhinnorhea. Mouth/Throat: Mucous membranes are moist.  Neck: No stridor.   Cardiovascular: Tachycardic, regular rhythm. Grossly normal heart sounds.   Respiratory: Increased work of breathing with belly breathing and speaking in 2-3 word sentences.  Severely decreased air movement throughout with coarse wheezes throughout. Gastrointestinal: Soft and nontender. No distention.  Musculoskeletal: No lower extremity tenderness nor edema.  No joint effusions. Neurologic:  Normal speech and language. No gross focal neurologic deficits are appreciated. Skin:  Skin is warm, dry and intact. No rash noted. Psychiatric: Mood and affect are normal. Speech and behavior are normal.  ____________________________________________   LABS (all labs ordered are listed, but only abnormal results are displayed)  Labs Reviewed  CBC WITH DIFFERENTIAL/PLATELET - Abnormal; Notable for the following components:      Result Value   Hemoglobin 11.6 (*)    HCT 35.8 (*)    All other components within normal limits  CBC WITH DIFFERENTIAL/PLATELET  BASIC METABOLIC PANEL  TROPONIN I  INFLUENZA PANEL BY PCR (TYPE A & B)   ____________________________________________  EKG  ED ECG REPORT I, Doran Stabler, the attending physician, personally viewed and interpreted this ECG.   Date: 02/04/2018  EKG Time: 0801  Rate:  120  Rhythm: sinus tachycardia  Axis: Normal  Intervals:none  ST&T Change: No ST segment elevation or depression.  No abnormal T wave inversion.  ____________________________________________  RADIOLOGY  Chest x-ray without acute process. ____________________________________________   PROCEDURES  Procedure(s) performed:   .Critical Care Performed by: Orbie Pyo, MD Authorized by: Orbie Pyo, MD   Critical care provider statement:    Critical care time (minutes):  35   Critical care time was exclusive of:  Separately billable procedures and treating other patients   Critical care was necessary to treat or prevent imminent or life-threatening deterioration of the following conditions:  Respiratory failure   Critical care was time spent personally by me on the following activities:  Development of treatment plan with patient or surrogate, discussions with consultants, evaluation of patient's response to treatment, examination of patient, obtaining history from patient or surrogate, ordering and performing treatments and interventions, ordering and review of laboratory studies, ordering and review of radiographic studies, pulse oximetry, re-evaluation of patient's condition and review of old charts    Critical Care performed:   ____________________________________________   INITIAL IMPRESSION / ASSESSMENT AND PLAN / ED COURSE  Pertinent labs & imaging results that were available during my care of the patient were reviewed by me and considered in my medical decision making (see chart for details).  Differential includes, but is not limited to, viral syndrome, bronchitis including COPD exacerbation, pneumonia, reactive airway disease including asthma, CHF including exacerbation with or without pulmonary/interstitial edema, pneumothorax, ACS, thoracic trauma, and pulmonary embolism. As part of my medical decision making, I reviewed the following data  within the electronic MEDICAL RECORD NUMBER Notes from prior ED visits  ----------------------------------------- 8:50 AM on 02/04/2018 -----------------------------------------  Patient on BiPAP and tolerating it well.  States improvement.  Still with severely decreased air movement with coarse wheezing throughout but improved from previous exam.  Patient to be admitted to the hospital.  Signed out to Dr. Anselm Jungling.  Patient aware of diagnosis well treatment and willing to comply.  Also complaining of chest pain which is reproducible to palpation across the anterior chest.  Will give a dose of morphine. ____________________________________________   FINAL CLINICAL IMPRESSION(S) / ED DIAGNOSES  COPD exacerbation.  NEW MEDICATIONS STARTED DURING THIS VISIT:  New Prescriptions   No medications on file     Note:  This document was prepared using Dragon voice recognition software and may include unintentional dictation errors.     Orbie Pyo, MD 02/04/18  0850  

## 2018-02-04 NOTE — Consult Note (Signed)
PULMONARY/CCM CONSULT NOTE  Requesting MD/Service: Inpatient hospitalist service Date of initial consultation: 02/04/2018 Reason for consultation: Acute asthmatic bronchitis, acute respiratory failure  PT PROFILE: 42 y.o. female smoker with repeated hospitalizations for "COPD exacerbation".  She is not followed by a pulmonologist as an outpatient but she does have home oxygen therapy which she does not wear consistently.  Admitted via [facility] ED with 1 day history of cough, shortness of breath, chest tightness.  BiPAP initiated in the ED and patient admitted to SDU.  DATA:   INTERVAL:  HPI:  As above.  She continues to smoke approximately 5 to 10 cigarettes/day.  She denies fever, pleuritic chest pain, hemoptysis, lower extremity edema, calf tenderness.  She has no significant environmental or occupational exposures.  She has no noted sick contacts.  Past Medical History:  Diagnosis Date  . Alcohol abuse   . Anemia 01/31/2011  . Asthma   . COPD (chronic obstructive pulmonary disease) ([place])   . Depression   . Hypoxia 02/01/2011  . Needs sleep apnea assessment 2013.07.31  . Tobacco abuse     Past Surgical History:  Procedure Laterality Date  . ANKLE GANGLION CYST EXCISION    . BACK SURGERY    . CESAREAN SECTION    . COLONOSCOPY WITH PROPOFOL N/A 06/27/2016   Procedure: COLONOSCOPY WITH PROPOFOL;  Surgeon: Jonathon Bellows, MD;  Location[facility] ENDOSCOPY;  Service: Endoscopy;  Laterality: N/A;  . DILATION AND CURETTAGE OF UTERUS    . ESOPHAGOGASTRODUODENOSCOPY (EGD) WITH PROPOFOL N/A 06/27/2016   Procedure: ESOPHAGOGASTRODUODENOSCOPY (EGD) WITH PROPOFOL;  Surgeon: Jonathon Bellows, MD;  Location: [facility] ENDOSCOPY;  Service: Endoscopy;  Laterality: N/A;    MEDICATIONS: I have reviewed all medications and confirmed regimen as documented  Social History   Socioeconomic History  . Marital status: Single    Spouse name: Not on file  . Number of children: Not on file  . Years of education: Not  on file  . Highest education level: Not on file  Occupational History  . Not on file  Social Needs  . Financial resource strain: Not on file  . Food insecurity:    Worry: Not on file    Inability: Not on file  . Transportation needs:    Medical: Not on file    Non-medical: Not on file  Tobacco Use  . Smoking status: Current Every Day Smoker    Packs/day: 1.00    Years: 20.00    Pack years: 20.00    Types: Cigarettes    Last attempt to quit: 06/19/2012    Years since quitting: 5.6  . Smokeless tobacco: Never Used  Substance and Sexual Activity  . Alcohol use: Yes  . Drug use: No    Types: "Crack" cocaine    Comment: crack-Not since September  . Sexual activity: Never    Comment: Homosexual  Lifestyle  . Physical activity:    Days per week: Not on file    Minutes per session: Not on file  . Stress: Not on file  Relationships  . Social connections:    Talks on phone: Not on file    Gets together: Not on file    Attends religious service: Not on file    Active member of club or organization: Not on file    Attends meetings of clubs or organizations: Not on file    Relationship status: Not on file  . Intimate partner violence:    Fear of current or ex partner: Not on file  Emotionally abused: Not on file    Physically abused: Not on file    Forced sexual activity: Not on file  Other Topics Concern  . Not on file  Social History Narrative  . Not on file    Family History  Problem Relation Age of Onset  . Hypothyroidism Mother   . Coronary artery disease Mother   . Throat cancer Mother   . Diabetes Maternal Grandmother     ROS: No fever, myalgias/arthralgias, unexplained weight loss or weight gain No new focal weakness or sensory deficits No otalgia, hearing loss, visual changes, nasal and sinus symptoms, mouth and throat problems No neck pain or adenopathy No abdominal pain, N/V/D, diarrhea, change in bowel pattern No dysuria, change in urinary  pattern   Vitals:   02/04/18 1150 02/04/18 1200 02/04/18 1300 02/04/18 1400  BP:  (!) 91/58 (!) 87/63 103/60  Pulse: 94 93 (!) 105 (!) 103  Resp: 14 14 13 12   Temp:      TempSrc:      SpO2: 97% 98% 96% 95%  Weight:      Height:         EXAM:  Gen: WDWN, No overt respiratory distress HEENT: NCAT, sclera white, oropharynx normal Neck: Supple without LAN, thyromegaly, JVD Lungs: breath sounds coarse throughout with scattered wheezes.  Percussion normal throughout. Cardiovascular: RRR, no murmurs noted Abdomen: Soft, nontender, normal BS Ext: without clubbing, cyanosis, edema Neuro: CNs grossly intact, motor and sensory intact Skin: Limited exam, no lesions noted  DATA:   BMP Latest Ref Rng & Units 02/04/2018 04/30/2017 04/29/2017  Glucose 70 - 99 mg/dL 103(H) 127(H) 76  BUN 6 - 20 mg/dL 10 20 9   Creatinine 0.44 - 1.00 mg/dL 0.72 0.80 0.65  Sodium 135 - 145 mmol/L 135 133(L) 138  Potassium 3.5 - 5.1 mmol/L 3.6 3.9 3.7  Chloride 98 - 111 mmol/L 104 102 105  CO2 22 - 32 mmol/L 24 22 19(L)  Calcium 8.9 - 10.3 mg/dL 8.6(L) 8.9 9.4    CBC Latest Ref Rng & Units 02/04/2018 04/30/2017 04/29/2017  WBC 4.0 - 10.5 K/uL 7.0 6.8 11.1(H)  Hemoglobin 12.0 - 15.0 g/dL 11.6(L) 11.8(L) 12.9  Hematocrit 36.0 - 46.0 % 35.8(L) 37.4 41.3  Platelets 150 - 400 K/uL 217 269 260    Influenza nasal swab negative  CXR: No acute cardiac or pulmonary findings  I have personally reviewed all chest radiographs reported above including CXRs and CT chest unless otherwise indicated  IMPRESSION:   1) smoker 2) acute asthmatic bronchitis 3) history of "COPD" without prior PFTs to confirm this diagnosis   PLAN:  Change BiPAP to as needed only Continue supplemental O2 as needed to maintain SPO2 >90% Continue systemic steroids.  Dosing regimen adjusted Continue nebulized bronchodilators Add nebulized steroids Counseled regarding the need for smoking cessation and complete abstinence Likely  transfer out of ICU/SDU in AM 12/24 I will arrange for pulmonary follow-up after discharge    Merton Border, MD PCCM service Mobile [phone] Pager [phone] 02/04/2018 3:23 PM

## 2018-02-04 NOTE — ED Triage Notes (Signed)
Patient from home via [facility]. Reports history of asthma and COPD. States last night she started having worsening SOB and dry cough. Patient used home inhalers with no relief. EMS gave 2 gram mag, 125 mg solumedrol, 3 duo nebs PTA. Patient with auditory wheezing and increased work of breathing.

## 2018-02-04 NOTE — H&P (Signed)
[place] [place] [facility] [place] NAME: [name]    MR#:  [id number]  DATE OF BIRTH:  10-Aug-1975  DATE OF ADMISSION:  02/04/2018  PRIMARY CARE PHYSICIAN: Alvester Chou, NP   REQUESTING/REFERRING PHYSICIAN: Dr. Clearnce Hasten  CHIEF COMPLAINT: Shortness of breath   Chief Complaint  Patient presents with  . Shortness of Breath    HISTORY OF PRESENT ILLNESS:  [name]  is a 42 y.o. female with a known history of COPD and on 3 L of oxygen at night and also as needed comes in because of shortness of breath, dry cough for last 2 days, chest tightness also.  Patient tried nebulizer at home without relief.  Does have dry cough unable to get any phlegm out.  Patient blood work is been normal, chest x-ray did not show pneumonia.  Started on BiPAP in the emergency room, patient still has diminished air entry, she is on BiPAP 10/5, 35% FiO2, according to her she feels better with BiPAP.  Will admit her stepdown status, I spoke with Dr. Rosita Fire.  Patient denies any fever.  PAST MEDICAL HISTORY:   Past Medical History:  Diagnosis Date  . Alcohol abuse   . Anemia 01/31/2011  . Asthma   . COPD (chronic obstructive pulmonary disease) ([place])   . Depression   . Hypoxia 02/01/2011  . Needs sleep apnea assessment 2013.07.31  . Tobacco abuse     PAST SURGICAL HISTOIRY:   Past Surgical History:  Procedure Laterality Date  . ANKLE GANGLION CYST EXCISION    . BACK SURGERY    . CESAREAN SECTION    . COLONOSCOPY WITH PROPOFOL N/A 06/27/2016   Procedure: COLONOSCOPY WITH PROPOFOL;  Surgeon: Jonathon Bellows, MD;  Location: [facility] ENDOSCOPY;  Service: Endoscopy;  Laterality: N/A;  . DILATION AND CURETTAGE OF UTERUS    . ESOPHAGOGASTRODUODENOSCOPY (EGD) WITH PROPOFOL N/A 06/27/2016   Procedure: ESOPHAGOGASTRODUODENOSCOPY (EGD) WITH PROPOFOL;  Surgeon: Jonathon Bellows, MD;  Location: [facility] ENDOSCOPY;  Service: Endoscopy;  Laterality: N/A;    SOCIAL HISTORY:    Social History   Tobacco Use  . Smoking status: Current Every Day Smoker    Packs/day: 1.00    Years: 20.00    Pack years: 20.00    Types: Cigarettes    Last attempt to quit: 06/19/2012    Years since quitting: 5.6  . Smokeless tobacco: Never Used  Substance Use Topics  . Alcohol use: Yes    FAMILY HISTORY:   Family History  Problem Relation Age of Onset  . Hypothyroidism Mother   . Coronary artery disease Mother   . Throat cancer Mother   . Diabetes Maternal Grandmother     DRUG ALLERGIES:  No Known Allergies  REVIEW OF SYSTEMS:  CONSTITUTIONAL:  does have fatigue but no fever. EYES: No blurred or double vision.  EARS, NOSE, AND THROAT: No tinnitus or ear pain.  RESPIRATORY: Dry cough, shortness of breath for last 2 days. CARDIOVASCULAR: No chest pain, orthopnea, edema.  GASTROINTESTINAL: No nausea, vomiting, diarrhea or abdominal pain.  GENITOURINARY: No dysuria, hematuria.  ENDOCRINE: No polyuria, nocturia,  HEMATOLOGY: No anemia, easy bruising or bleeding SKIN: No rash or lesion. MUSCULOSKELETAL: No joint pain or arthritis.   NEUROLOGIC: No tingling, numbness, weakness.  PSYCHIATRY: No anxiety or depression.   MEDICATIONS AT HOME:   Prior to Admission medications   Medication Sig Start Date End Date Taking? Authorizing Provider  albuterol (PROVENTIL HFA;VENTOLIN HFA) 108 (90 Base) MCG/ACT inhaler Inhale 1-2  puffs into the lungs every 6 (six) hours as needed for wheezing or shortness of breath. 05/02/17   Isaac Bliss, Rayford Halsted, MD  albuterol (PROVENTIL) (2.5 MG/3ML) 0.083% nebulizer solution Take 3 mLs (2.5 mg total) by nebulization every 6 (six) hours as needed for wheezing or shortness of breath. For asthma 12/07/16   Raiford Noble Latif, DO  clobetasol ointment (TEMOVATE) 5.64 % Apply 1 application topically 2 (two) times daily. 07/12/17   Tereasa Coop, PA-C  FLUoxetine (PROZAC) 40 MG capsule Take 40 mg by mouth daily. 08/05/16   [provider]  Fluticasone-Salmeterol (ADVAIR) 500-50 MCG/DOSE AEPB Inhale 2 puffs into the lungs 2 (two) times daily.    [provider]  hydrOXYzine (ATARAX/VISTARIL) 25 MG tablet Take 1 tablet (25 mg total) by mouth every 6 (six) hours. For itching. 07/12/17   Tereasa Coop, PA-C  ipratropium (ATROVENT) 0.02 % nebulizer solution Inhale 0.5 mg into the lungs 4 (four) times daily as needed.     [provider]  levothyroxine (SYNTHROID, LEVOTHROID) 137 MCG tablet Take 137 mcg by mouth daily. 11/22/16   [provider]  montelukast (SINGULAIR) 10 MG tablet Take 10 mg by mouth at bedtime.    [provider]  pantoprazole (PROTONIX) 40 MG tablet Take 1 tablet by mouth daily. 03/19/17   [provider]  predniSONE (DELTASONE) 10 MG tablet Take 1 tablet (10 mg total) by mouth daily with breakfast. Take 6 tablets today and then decrease by 1 tablet daily until none are left. 05/02/17   Isaac Bliss, Rayford Halsted, MD  sucralfate (CARAFATE) 1 g tablet Take 1 tablet by mouth 4 (four) times daily as needed. 03/19/17   [provider]  varenicline (CHANTIX) 1 MG tablet Take 1 mg by mouth daily.    [provider]      VITAL SIGNS:  Blood pressure (!) 106/51, pulse (!) 102, temperature 98 F (36.7 C), temperature source Axillary, resp. rate (!) 34, height 5\' 5"  (1.651 m), weight 95.3 kg, last menstrual period 01/14/2018, SpO2 100 %.  PHYSICAL EXAMINATION:  GENERAL:  42 y.o.-year-old patient lying in the bed with no acute distress.  EYES: Pupils equal, round, reactive to light and accommodation. No scleral icterus. Extraocular muscles intact.  HEENT: Head atraumatic, normocephalic. Oropharynx and nasopharynx clear.  NECK:  Supple, no jugular venous distention. No thyroid enlargement, no tenderness.  LUNGS: Patient has diminished air entry bilaterally,   cARDIOVASCULAR: S1, S2 normal. No murmurs, rubs, or gallops.  ABDOMEN: Soft, nontender, nondistended.  Bowel sounds present. No organomegaly or mass.  EXTREMITIES: No pedal edema, cyanosis, or clubbing.  NEUROLOGIC: Cranial nerves II through XII are intact. Muscle strength 5/5 in all extremities. Sensation intact. Gait not checked.  PSYCHIATRIC: The patient is alert and oriented x 3.  SKIN: No obvious rash, lesion, or ulcer.   LABORATORY PANEL:   CBC Recent Labs  Lab 02/04/18 0838  WBC 7.0  HGB 11.6*  HCT 35.8*  PLT 217   ------------------------------------------------------------------------------------------------------------------  Chemistries  Recent Labs  Lab 02/04/18 0808  NA 135  K 3.6  CL 104  CO2 24  GLUCOSE 103*  BUN 10  CREATININE 0.72  CALCIUM 8.6*   ------------------------------------------------------------------------------------------------------------------  Cardiac Enzymes Recent Labs  Lab 02/04/18 0808  TROPONINI <0.03   ------------------------------------------------------------------------------------------------------------------  RADIOLOGY:  Dg Chest 1 View  Result Date: 02/04/2018 CLINICAL DATA:  Shortness of breath with chest tightness EXAM: CHEST  1 VIEW COMPARISON:  April 29, 2017. FINDINGS: There is  no edema or consolidation. The heart size and pulmonary vascularity are normal. No adenopathy. No pneumothorax. No bone lesions. IMPRESSION: No edema or consolidation. Electronically Signed   By: Lowella Grip III [name]   On: 02/04/2018 08:12    EKG:   Orders placed or performed during the hospital encounter of 02/04/18  . ED EKG  . ED EKG  . EKG 12-Lead  . EKG 12-Lead  . EKG 12-Lead  . EKG 12-Lead  EKG showed sinus tachycardia at 120 bpm, no ST-T changes.  IMPRESSION AND PLAN:   42 year old female with history of COPD, chronic respiratory failure on 3 days of oxygen at night and as needed comes in because of shortness of breath, cough for 2 days. 1.  Acute on chronic respiratory failure due to COPD exacerbation.  Patient  started on BiPAP in the emergency room, received nebulizers, steroids and antibiotics in the ER still has diminished air entry in both lungs and slightly tachycardic.  Continue BiPAP support, I spoke with intensivist.  Continue bronchodilators, IV steroids, chest x-ray is negative flu test is pending.  Continue IV fluids for today. #2'. chest pain secondary to chest tightness  Due  To shortness of breath, will use Percocet today. #3: Depression: Continue home medicines. 4.GERD: Continue PPIs. 5.  Hypothyroidism: Continue Synthyroid. #6. history of tobacco abuse: Counseled to quit smoking.  Patient told me she is in the process of quitting.  Smoking cessation counseling done for 15 minutes. All the records are reviewed and case discussed with ED provider. Management plans discussed with the patient, family and they are in agreement.  CODE STATUS: Full code  TOTAL TIME TAKING CARE OF THIS PATIENT: 55 minutes.    Epifanio Lesches M.D on 02/04/2018 at 9:13 AM  Between 7am to 6pm - Pager - [phone]  After 6pm go to [email or web address] - password [email or web address]  [facility]  Office  [phone]  CC: Primary care physician; Alvester Chou, NP  Note: This dictation was prepared with Dragon dictation along with smaller phrase technology. Any transcriptional errors that result from this process are unintentional.

## 2018-02-05 LAB — CBC
HCT: 36.6 % (ref 36.0–46.0)
Hemoglobin: 11.5 g/dL — ABNORMAL LOW (ref 12.0–15.0)
MCH: 28.8 pg (ref 26.0–34.0)
MCHC: 31.4 g/dL (ref 30.0–36.0)
MCV: 91.5 fL (ref 80.0–100.0)
Platelets: 261 10*3/uL (ref 150–400)
RBC: 4 MIL/uL (ref 3.87–5.11)
RDW: 12.7 % (ref 11.5–15.5)
WBC: 12.7 10*3/uL — ABNORMAL HIGH (ref 4.0–10.5)
nRBC: 0 % (ref 0.0–0.2)

## 2018-02-05 LAB — BASIC METABOLIC PANEL
Anion gap: 5 (ref 5–15)
BUN: 14 mg/dL (ref 6–20)
CHLORIDE: 112 mmol/L — AB (ref 98–111)
CO2: 20 mmol/L — ABNORMAL LOW (ref 22–32)
CREATININE: 0.6 mg/dL (ref 0.44–1.00)
Calcium: 8.8 mg/dL — ABNORMAL LOW (ref 8.9–10.3)
GFR calc Af Amer: >60 mL/min (ref >60)
GFR calc non Af Amer: >60 mL/min (ref >60)
GLUCOSE: 145 mg/dL — AB (ref 70–99)
Potassium: 4.3 mmol/L (ref 3.5–5.1)
Sodium: 137 mmol/L (ref 135–145)

## 2018-02-05 LAB — GLUCOSE, CAPILLARY: Glucose-Capillary: 114 mg/dL — ABNORMAL HIGH (ref 70–99)

## 2018-02-05 MED ORDER — PREDNISONE 10 MG (21) PO TBPK: ORAL_TABLET | ORAL | 0 refills | Status: DC

## 2018-02-05 NOTE — Progress Notes (Signed)
Stable for discharge home, patient is off BiPAP yesterday, she was on BiPAP for 2 hours after admission to ICU, now on 3 days of oxygen.  She is feeling much better and wants to go home.  Continue nebs, tapering course of prednisone given.  Patient is advised to quit smoking.  Patient needs to follow-up with pulmonary as an outpatient.  Discharge instructions in the computer, spoke with patient's nurse.

## 2018-02-06 LAB — HIV ANTIBODY (ROUTINE TESTING W REFLEX): HIV Screen 4th Generation wRfx: NONREACTIVE

## 2018-02-08 ENCOUNTER — Telehealth: Payer: Self-pay

## 2018-02-08 NOTE — Telephone Encounter (Signed)
Attempted to call for HFU apt, no answer, no machine.

## 2018-02-09 NOTE — Discharge Summary (Signed)
[name], is a 42 y.o. female  DOB 1976-01-09  MRN [id number].  Admission date:  02/04/2018  Admitting Physician  Epifanio Lesches, MD  Discharge Date:  02/05/2018   Primary MD  Alvester Chou, NP  Recommendations for primary care physician for things to follow:   Follow-up with PCP in 1 week   Admission Diagnosis  COPD exacerbation ([place]) [J44.1]   Discharge Diagnosis  COPD exacerbation ([place]) [J44.1]   Active Problems:   Acute on chronic respiratory failure [facility])      Past Medical History:  Diagnosis Date  . Alcohol abuse   . Anemia 01/31/2011  . Asthma   . COPD (chronic obstructive pulmonary disease) ([place])   . Depression   . Hypoxia 02/01/2011  . Needs sleep apnea assessment 2013.07.31  . Tobacco abuse     Past Surgical History:  Procedure Laterality Date  . ANKLE GANGLION CYST EXCISION    . BACK SURGERY    . CESAREAN SECTION    . COLONOSCOPY WITH PROPOFOL N/A 06/27/2016   Procedure: COLONOSCOPY WITH PROPOFOL;  Surgeon: Jonathon Bellows, MD;  Location: [facility] ENDOSCOPY;  Service: Endoscopy;  Laterality: N/A;  . DILATION AND CURETTAGE OF UTERUS    . ESOPHAGOGASTRODUODENOSCOPY (EGD) WITH PROPOFOL N/A 06/27/2016   Procedure: ESOPHAGOGASTRODUODENOSCOPY (EGD) WITH PROPOFOL;  Surgeon: Jonathon Bellows, MD;  Location: [facility] ENDOSCOPY;  Service: Endoscopy;  Laterality: N/A;       History of present illness and  Hospital Course:     Kindly see H&P for history of present illness and admission details, please review complete Labs, Consult reports and Test reports for all details in brief  HPI  from the history and physical done on the day of admission 42 year old female patient with active tobacco abuse admitted because of worsening shortness of breath, cough, found to have acute asthma exacerbation, received  multiple nebulizer treatments, admitted to ICU for  BiPAP support.   Hospital Course  #1 acute asthmatic bronchitis with acute respiratory failure, hypoxic, initially admitted to stepdown.  Patient had severe wheezing, received multiple bedside treatments in the emergency room, started on BiPAP in the emergency room, admitted the patient to stepdown status.  Chest x-ray negative, blood work is largely normal.  Patient was on BiPAP for few hours, then changed back to her chronic oxygen she is is chronically at 3 L.  Discharge home with tapering course of prednisone next today.  Patient felt much better, wanted to go home for Christmas, discharged home with tapering course of prednisone, advised to continue her home dose albuterol nebulizer every 4 hours for the next 1 to 2 days, resume her home dose inhalers after that.  We counseled her regarding smoking cessation and complete abstinence.  Patient can follow-up with pulmonary as an outpatient. #2 .hypothyroidism: Continue Synthyroid 3.  Depression: Patient is on Prozac.  #4 GERD: Continue PPI, sucralfate. Discharge Condition: Stable   Follow UP  Follow-up Information    Alvester Chou, NP. Schedule an appointment as soon as possible for a visit in 1 week(s).   Specialty:  Nurse Practitioner Contact information: [facility] [place] [address] [phone]             Discharge Instructions  and  Discharge Medications      Allergies as of 02/05/2018   No Known Allergies     Medication List    STOP taking these medications   predniSONE 10 MG tablet Commonly known as:  DELTASONE Replaced by:  predniSONE 10 MG (21) Tbpk tablet     TAKE these medications   albuterol (2.5 MG/3ML) 0.083% nebulizer solution Commonly known as:  PROVENTIL Take 3 mLs (2.5 mg total) by nebulization every 6 (six) hours as needed for wheezing or shortness of breath. For asthma   albuterol 108 (90 Base) MCG/ACT  inhaler Commonly known as:  PROVENTIL HFA;VENTOLIN HFA Inhale 1-2 puffs into the lungs every 6 (six) hours as needed for wheezing or shortness of breath.   clobetasol ointment 0.05 % Commonly known as:  TEMOVATE Apply 1 application topically 2 (two) times daily.   FLUoxetine 40 MG capsule Commonly known as:  PROZAC Take 40 mg by mouth daily.   Fluticasone-Salmeterol 500-50 MCG/DOSE Aepb Commonly known as:  ADVAIR Inhale 2 puffs into the lungs 2 (two) times daily.   hydrOXYzine 25 MG tablet Commonly known as:  ATARAX/VISTARIL Take 1 tablet (25 mg total) by mouth every 6 (six) hours. For itching.   ipratropium 0.02 % nebulizer solution Commonly known as:  ATROVENT Inhale 0.5 mg into the lungs 4 (four) times daily as needed.   levothyroxine 137 MCG tablet Commonly known as:  SYNTHROID, LEVOTHROID Take 137 mcg by mouth daily.   montelukast 10 MG tablet Commonly known as:  SINGULAIR Take 10 mg by mouth at bedtime.   pantoprazole 40 MG tablet Commonly known as:  PROTONIX Take 1 tablet by mouth daily.   predniSONE 10 MG (21) Tbpk tablet Commonly known as:  STERAPRED UNI-PAK 21 TAB Taper by 10 mg p.o. daily. Replaces:  predniSONE 10 MG tablet   sucralfate 1 g tablet Commonly known as:  CARAFATE Take 1 tablet by mouth 4 (four) times daily as needed.   varenicline 1 MG tablet Commonly known as:  CHANTIX Take 1 mg by mouth daily.         Diet and Activity recommendation: See Discharge Instructions above   Consults obtained -intensivist   Major procedures and Radiology Reports - PLEASE review detailed and final reports for all details, in brief -      Dg Chest 1 View  Result Date: 02/04/2018 CLINICAL DATA:  Shortness of breath with chest tightness EXAM: CHEST  1 VIEW COMPARISON:  April 29, 2017. FINDINGS: There is no edema or consolidation. The heart size and pulmonary vascularity are normal. No adenopathy. No pneumothorax. No bone lesions. IMPRESSION: No edema  or consolidation. Electronically Signed   By: Lowella Grip III [name]   On: 02/04/2018 08:12    Micro Results    Recent Results (from the past 240 hour(s))  MRSA PCR Screening     Status: None   Collection Time: 02/04/18 10:20 AM  Result Value Ref Range Status   MRSA by PCR NEGATIVE NEGATIVE Final    Comment:        The GeneXpert MRSA Assay (FDA approved for NASAL specimens only), is one component of a comprehensive MRSA colonization surveillance program. It is not intended to diagnose MRSA infection nor to guide or monitor treatment for MRSA infections. Performed at [facility], [address] [place], [place] [address]        Today   Subjective:   [name] today has no headache,no chest abdominal pain,no new weakness tingling or numbness, feels much better wants to go home today.   Objective:   Blood pressure 114/61, pulse 78, temperature 98 F (36.7 C), temperature source Axillary, resp. rate 19, height 5\' 5"  (1.651 m), weight 92 kg, last menstrual  period 01/14/2018, SpO2 97 %.  No intake or output data in the 24 hours ending 02/09/18 2056  Exam Awake Alert, Oriented x 3, No new F.N deficits, Normal affect [place].AT,PERRAL Supple Neck,No JVD, No cervical lymphadenopathy appriciated.  Symmetrical Chest wall movement, Good air movement bilaterally, CTAB RRR,No Gallops,Rubs or new Murmurs, No Parasternal Heave +ve B.Sounds, Abd Soft, Non tender, No organomegaly appriciated, No rebound -guarding or rigidity. No Cyanosis, Clubbing or edema, No new Rash or bruise  Data Review   CBC w Diff:  Lab Results  Component Value Date   WBC 12.7 (H) 02/05/2018   HGB 11.5 (L) 02/05/2018   HGB 11.6 (L) 05/13/2013   HCT 36.6 02/05/2018   HCT 36.2 05/13/2013   PLT 261 02/05/2018   PLT 264 05/13/2013   LYMPHOPCT 26 02/04/2018   LYMPHOPCT 20.3 05/13/2013   MONOPCT 5 02/04/2018   MONOPCT 7.2 05/13/2013   EOSPCT 4 02/04/2018   EOSPCT 0.0 05/13/2013    BASOPCT 0 02/04/2018   BASOPCT 0.1 05/13/2013    CMP:  Lab Results  Component Value Date   NA 137 02/05/2018   NA 136 05/11/2013   K 4.3 02/05/2018   K 4.1 05/11/2013   CL 112 (H) 02/05/2018   CL 106 05/11/2013   CO2 20 (L) 02/05/2018   CO2 27 05/11/2013   BUN 14 02/05/2018   BUN 12 05/11/2013   CREATININE 0.60 02/05/2018   CREATININE 0.71 05/15/2013   PROT 6.8 12/07/2016   PROT 7.7 05/10/2013   ALBUMIN 3.7 12/07/2016   ALBUMIN 3.8 05/10/2013   BILITOT 0.5 12/07/2016   BILITOT 0.2 05/10/2013   ALKPHOS 56 12/07/2016   ALKPHOS 95 05/10/2013   AST 14 (L) 12/07/2016   AST 8 (L) 05/10/2013   ALT 14 12/07/2016   ALT 17 05/10/2013  .   Total Time in preparing paper work, data evaluation and todays exam - 35 minutes  Epifanio Lesches M.D on 02/05/2018 at 8:56 PM    Note: This dictation was prepared with Dragon dictation along with smaller phrase technology. Any transcriptional errors that result from this process are unintentional.

## 2018-03-06 ENCOUNTER — Other Ambulatory Visit
Admission: RE | Admit: 2018-03-06 | Discharge: 2018-03-06 | Disposition: A | Discharge status: Home / Self Care | Payer: BLUE CROSS/BLUE SHIELD | Source: Ambulatory Visit | Attending: Internal Medicine | Admitting: Internal Medicine

## 2018-03-06 ENCOUNTER — Encounter: Payer: Self-pay | Admitting: Internal Medicine

## 2018-03-06 ENCOUNTER — Ambulatory Visit: Payer: BLUE CROSS/BLUE SHIELD | Admitting: Internal Medicine

## 2018-03-06 VITALS — BP 110/62 | HR 75 | Ht 65.0 in | Wt 207.2 lb

## 2018-03-06 DIAGNOSIS — J4550 Severe persistent asthma, uncomplicated: Secondary | ICD-10-CM | POA: Diagnosis present

## 2018-03-06 DIAGNOSIS — G4719 Other hypersomnia: Secondary | ICD-10-CM | POA: Diagnosis not present

## 2018-03-06 MED ORDER — IPRATROPIUM-ALBUTEROL 0.5-2.5 (3) MG/3ML IN SOLN
3.0000 mL | Freq: Once | RESPIRATORY_TRACT | Status: AC
  Administered 2018-03-06: 3 mL via RESPIRATORY_TRACT

## 2018-03-06 MED ORDER — BUDESONIDE-FORMOTEROL FUMARATE 160-4.5 MCG/ACT IN AERO: 2.0000 | INHALATION_SPRAY | Freq: Two times a day (BID) | RESPIRATORY_TRACT | 12 refills | Status: DC

## 2018-03-06 MED ORDER — PREDNISONE 20 MG PO TABS: 20.0000 mg | ORAL_TABLET | Freq: Every day | ORAL | 0 refills | Status: DC

## 2018-03-06 MED ORDER — IPRATROPIUM-ALBUTEROL 0.5-2.5 (3) MG/3ML IN SOLN: 3.0000 mL | Freq: Four times a day (QID) | RESPIRATORY_TRACT | Status: DC

## 2018-03-06 NOTE — Progress Notes (Signed)
Name: [name] MRN: [id number] DOB: [birth year]     CONSULTATION DATE: 03/06/2018 REFERRING MD : Aris Lot  CHIEF COMPLAINT: asthma  STUDIES:    02/04/18  CXR independently reviewed by Me No pneumonia No effusions No edema   HISTORY OF PRESENT ILLNESS:  43 yo female with ongoing tobacco abuse recent admission to hospital for acute ASTHMA exacerbation  Patient was treated with oxygen therapy, IV steroids, NEB therapy FLU A/B NEG HIV NEG Discharged home with  Advair and Prednisone taper  Previous smoker 1-2 PPD Quit 2 weeks ago Using nicotine gum Is very anxious  Previous tox screen in 2013 +Cocaine  Currently, has intermittent wheezing, SOB and DOE +intermittent cough and chest congestion  No signs of infection at this time No signs of asthma exacerbation at this time  seen today for problems with sleep Patient  has been having sleep problems for many years Patient has been having excessive daytime sleepiness Patient has been having extreme fatigue and tiredness, lack of energy +  very Loud snoring every night + struggling breathe at night and gasps for air   Discussed sleep data and reviewed with patient.  Encouraged proper weight management.  Discussed driving precautions and its relationship with hypersomnolence.  Discussed operating dangerous equipment and its relationship with hypersomnolence.  Discussed sleep hygiene, and benefits of a fixed sleep waked time.  The importance of getting eight or more hours of sleep discussed with patient.  Discussed limiting the use of the computer and television before bedtime.  Decrease naps during the day, so night time sleep will become enhanced.  Limit caffeine, and sleep deprivation.  HTN, stroke, and heart failure are potential risk factors.    EPWORTH SLEEP SCORE 10   Has chronic hypoxic resp failure on 3 L [place]   PAST MEDICAL HISTORY :   has a past medical history of Alcohol abuse, Anemia  (01/31/2011), Asthma, COPD (chronic obstructive pulmonary disease) ([place]), Depression, Hypoxia (02/01/2011), Needs sleep apnea assessment (2013.07.31), and Tobacco abuse.  has a past surgical history that includes Back surgery; Cesarean section; Dilation and curettage of uterus; Ankle ganglion cyst excision; Colonoscopy with propofol (N/A, 06/27/2016); and Esophagogastroduodenoscopy (egd) with propofol (N/A, 06/27/2016). Prior to Admission medications   Medication Sig Start Date End Date Taking? Authorizing Provider  albuterol (PROVENTIL HFA;VENTOLIN HFA) 108 (90 Base) MCG/ACT inhaler Inhale 1-2 puffs into the lungs every 6 (six) hours as needed for wheezing or shortness of breath. 05/02/17   Isaac Bliss, Rayford Halsted, MD  albuterol (PROVENTIL) (2.5 MG/3ML) 0.083% nebulizer solution Take 3 mLs (2.5 mg total) by nebulization every 6 (six) hours as needed for wheezing or shortness of breath. For asthma 12/07/16   Raiford Noble Latif, DO  clobetasol ointment (TEMOVATE) 5.80 % Apply 1 application topically 2 (two) times daily. 07/12/17   Tereasa Coop, PA-C  FLUoxetine (PROZAC) 40 MG capsule Take 40 mg by mouth daily. 08/05/16   [provider]  Fluticasone-Salmeterol (ADVAIR) 500-50 MCG/DOSE AEPB Inhale 2 puffs into the lungs 2 (two) times daily.    [provider]  hydrOXYzine (ATARAX/VISTARIL) 25 MG tablet Take 1 tablet (25 mg total) by mouth every 6 (six) hours. For itching. 07/12/17   Tereasa Coop, PA-C  ipratropium (ATROVENT) 0.02 % nebulizer solution Inhale 0.5 mg into the lungs 4 (four) times daily as needed.     [provider]  levothyroxine (SYNTHROID, LEVOTHROID) 137 MCG tablet Take 137 mcg by mouth daily. 11/22/16   [provider]  montelukast (SINGULAIR) 10 MG tablet Take 10 mg by mouth at bedtime.    [provider]  pantoprazole (PROTONIX) 40 MG tablet Take 1 tablet by mouth daily. 03/19/17   [provider]  predniSONE (STERAPRED  UNI-PAK 21 TAB) 10 MG (21) TBPK tablet Taper by 10 mg p.o. daily. 02/05/18   Epifanio Lesches, MD  sucralfate (CARAFATE) 1 g tablet Take 1 tablet by mouth 4 (four) times daily as needed. 03/19/17   [provider]  varenicline (CHANTIX) 1 MG tablet Take 1 mg by mouth daily.    [provider]   No Known Allergies  FAMILY HISTORY:  family history includes Coronary artery disease in her mother; Diabetes in her maternal grandmother; Hypothyroidism in her mother; Throat cancer in her mother. SOCIAL HISTORY:  reports that she has been smoking cigarettes. She has a 20.00 pack-year smoking history. She has never used smokeless tobacco. She reports current alcohol use. She reports that she does not use drugs.  REVIEW OF SYSTEMS:   Constitutional: Negative for fever, chills, weight loss, malaise/fatigue and diaphoresis.  HENT: Negative for hearing loss, ear pain, nosebleeds, congestion, sore throat, neck pain, tinnitus and ear discharge.   Eyes: Negative for blurred vision, double vision, photophobia, pain, discharge and redness.  Respiratory: Negative for cough, hemoptysis, sputum production, shortness of breath, wheezing and stridor.   Cardiovascular: Negative for chest pain, palpitations, orthopnea, claudication, leg swelling and PND.  Gastrointestinal: Negative for heartburn, nausea, vomiting, abdominal pain, diarrhea, constipation, blood in stool and melena.  Genitourinary: Negative for dysuria, urgency, frequency, hematuria and flank pain.  Musculoskeletal: Negative for myalgias, back pain, joint pain and falls.  Skin: Negative for itching and rash.  Neurological: Negative for dizziness, tingling, tremors, sensory change, speech change, focal weakness, seizures, loss of consciousness, weakness and headaches.  Endo/Heme/Allergies: Negative for environmental allergies and polydipsia. Does not bruise/bleed easily.  ALL OTHER ROS ARE NEGATIVE  BP 110/62 (BP Location: Left Arm,  Cuff Size: Normal)   Pulse 75   Ht 5\' 5"  (1.651 m)   Wt 207 lb 3.2 oz (94 kg)   SpO2 96%   BMI 34.48 kg/m   Physical Examination:   GENERAL:NAD, no fevers, chills, no weakness no fatigue HEAD: Normocephalic, atraumatic.  EYES: Pupils equal, round, reactive to light. Extraocular muscles intact. No scleral icterus.  MOUTH: Moist mucosal membrane.   EAR, NOSE, THROAT: Clear without exudates. No external lesions.  NECK: Supple. No thyromegaly. No nodules. No JVD.  PULMONARY:CTA B/L no wheezes, no crackles, no rhonchi CARDIOVASCULAR: S1 and S2. Regular rate and rhythm. No murmurs, rubs, or gallops. No edema.  GASTROINTESTINAL: Soft, nontender, nondistended. No masses. Positive bowel sounds.  MUSCULOSKELETAL: No swelling, clubbing, or edema. Range of motion full in all extremities.  NEUROLOGIC: Cranial nerves II through XII are intact. No gross focal neurological deficits.  SKIN: No ulceration, lesions, rashes, or cyanosis. Skin warm and dry. Turgor intact.  PSYCHIATRIC: Mood, affect within normal limits. The patient is awake, alert and oriented x 3. Insight, judgment intact. +anxious     ASSESSMENT / PLAN:  43 yo white female with ongoing wheezing with chest congestion and cough with previous extensive smoking history with signs and symptoms of COPD with asthma component was recently admitted for acute respiratory failure was discharged on prednisone and Advair. 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   #1 shortness of breath and dyspnea exertion Multifactorial related to COPD/asthma with obesity  and deconditioned state  #2 COPD asthma-severe and persistent Not controlled at this time Will give DuoNeb in office now Prednisone 20 mg for 10 days Start Symbicort therapy Albuterol nebs as needed Recommend checking alpha-1 antitrypsin levels  #3 chronic hypoxic respiratory failure on 2 L of nasal cannula oxygen at nighttime Check 6-minute walk test to assess for exertional hypoxia  #4 signs symptoms of sleep apnea Obtain sleep study  #5 continue smoking cessation Nicotine gum as needed   Patient satisfied with Plan of action and management. All questions answered  Corrin Parker, [name]  [facility]  Medical Director [place] Director [facility] Cardio-Pulmonary Department

## 2018-03-06 NOTE — Patient Instructions (Signed)
ALBUTEROL NEBS AS NEEDED  PREDNISONE 20 MG DAILY FOR 10 DAYS  STOP ADVAIR  START SYMBICORT  OBTAIN SLEEP STUDY   CHECK 6MWT  CHECK ALPHA ONE TESTING

## 2018-03-11 LAB — ALPHA-1 ANTITRYPSIN PHENOTYPE: A-1 Antitrypsin, Ser: 143 mg/dL (ref 101–187)

## 2018-03-22 ENCOUNTER — Telehealth: Payer: Self-pay

## 2018-03-22 NOTE — Telephone Encounter (Signed)
error 

## 2018-03-22 NOTE — Telephone Encounter (Signed)
LM on VM to r/s 6 min walk. Gave 2/11 or 2/14 as options.

## 2018-03-26 ENCOUNTER — Telehealth: Payer: Self-pay | Admitting: Internal Medicine

## 2018-03-26 NOTE — Telephone Encounter (Signed)
lmtcb x1 for pt. 

## 2018-03-27 NOTE — Telephone Encounter (Signed)
Attempted to call pt. LMTCB.

## 2018-03-28 ENCOUNTER — Telehealth: Payer: Self-pay | Admitting: Internal Medicine

## 2018-03-28 ENCOUNTER — Ambulatory Visit: Payer: Self-pay

## 2018-03-28 NOTE — Telephone Encounter (Signed)
Yes, that's OK 

## 2018-03-28 NOTE — Telephone Encounter (Addendum)
Called and spoke to pt regarding rescheduling SMW for 03/28/18. Pt is questioning why a six minute walk test is needed when she is already on oxygen. Pt stated that she would like a POC, as she has to carry the big concentrator when she goes out.  Per last OV note 03/06/18, pt was instructed to f/u in 38mo. I have offered to make 68mo f/u. Pt stated that she would be switching doctors.  Pt is also requesting lab results from 03/06/18. SMW has been canceled.   DK please advise if okay to send order to DME for eval of POC. Thanks

## 2018-03-28 NOTE — Telephone Encounter (Signed)
Lmtcb x1 for pt  

## 2018-03-28 NOTE — Telephone Encounter (Signed)
3 rd message left to reschedule SMW. appt note added on appt desk to reschedule. Explained in detail on message that visit does take longer than 6 mins and must be completed within 30 days of office visit. Note closed.

## 2018-04-10 NOTE — Telephone Encounter (Signed)
Will route this over to [facility] triage pool.

## 2018-04-10 NOTE — Telephone Encounter (Signed)
Message previously closed. Pt stated she may be changing pulm doctor. If she would like to reschedule appt with [facility] she will call office.

## 2018-04-18 ENCOUNTER — Ambulatory Visit: Payer: BLUE CROSS/BLUE SHIELD | Attending: Internal Medicine

## 2018-06-03 ENCOUNTER — Other Ambulatory Visit: Payer: Self-pay | Admitting: Internal Medicine

## 2018-06-03 DIAGNOSIS — J4550 Severe persistent asthma, uncomplicated: Secondary | ICD-10-CM

## 2019-02-11 ENCOUNTER — Other Ambulatory Visit: Payer: Self-pay

## 2019-02-11 ENCOUNTER — Emergency Department (HOSPITAL_COMMUNITY)
Admission: EM | Admit: 2019-02-11 | Discharge: 2019-02-11 | Disposition: A | Discharge status: Home / Self Care | Payer: BLUE CROSS/BLUE SHIELD | Attending: Emergency Medicine | Admitting: Emergency Medicine

## 2019-02-11 DIAGNOSIS — B029 Zoster without complications: Secondary | ICD-10-CM | POA: Insufficient documentation

## 2019-02-11 DIAGNOSIS — Z79899 Other long term (current) drug therapy: Secondary | ICD-10-CM | POA: Insufficient documentation

## 2019-02-11 DIAGNOSIS — F17210 Nicotine dependence, cigarettes, uncomplicated: Secondary | ICD-10-CM | POA: Insufficient documentation

## 2019-02-11 DIAGNOSIS — J441 Chronic obstructive pulmonary disease with (acute) exacerbation: Secondary | ICD-10-CM | POA: Insufficient documentation

## 2019-02-11 MED ORDER — VALACYCLOVIR HCL 500 MG PO TABS
1000.0000 mg | ORAL_TABLET | ORAL | Status: AC
  Administered 2019-02-11: 23:00:00 1000 mg via ORAL
  Filled 2019-02-11: qty 2

## 2019-02-11 MED ORDER — VALACYCLOVIR HCL 1 G PO TABS: 1000.0000 mg | ORAL_TABLET | Freq: Three times a day (TID) | ORAL | 0 refills | Status: DC

## 2019-02-11 MED ORDER — ALBUTEROL SULFATE HFA 108 (90 BASE) MCG/ACT IN AERS: 1.0000 | INHALATION_SPRAY | Freq: Four times a day (QID) | RESPIRATORY_TRACT | 0 refills | Status: DC | PRN

## 2019-02-11 MED ORDER — PREDNISONE 20 MG PO TABS
40.0000 mg | ORAL_TABLET | Freq: Once | ORAL | Status: AC
  Administered 2019-02-11: 40 mg via ORAL
  Filled 2019-02-11: qty 2

## 2019-02-11 MED ORDER — PREDNISONE 20 MG PO TABS: 40.0000 mg | ORAL_TABLET | Freq: Every day | ORAL | 0 refills | Status: DC

## 2019-02-11 NOTE — Discharge Instructions (Signed)
The shingles will take approximately 1 or 2 weeks to go away.  You may have the rash for quite some time and even after the rash goes away you may have pain in this location for another month or so.  I would recommend you take the following medications  Valtrex, 1 g by mouth 3 times daily for 7 days, this has been prescribed Prednisone, 40 mg daily for 5 days, this will help both with the pain in your back but will also help with your breathing Albuterol, please take 2 puffs every 4 hours for the next 24 hours then 2 puffs as needed.  You should seek medical exam for severe or worsening symptoms.  Be aware that you are contagious to others until this rash completely scabs over.  Please read the attached instructions regarding shingles

## 2019-02-11 NOTE — ED Triage Notes (Addendum)
Patient states that she has an abscess on her back. Looks more like a red rash that patient describes hurting/like pins and needles. No obvious drainage from the site.Patient also states that she is having problems with her asthma and has used her inhaler. Rash looks like possible shingles.

## 2019-02-11 NOTE — ED Notes (Signed)
Dr Sabra Heck at bedside to evaluate pt

## 2019-02-11 NOTE — ED Provider Notes (Signed)
[facility] EMERGENCY DEPARTMENT Provider Note   CSN: [id number] Arrival date & time: 02/11/19  2022     History Chief Complaint  Patient presents with  . Rash    asthma complaints    [name] is a 43 y.o. female.  HPI   This patient is a pleasant 43 year old female, known history of COPD, she presents with a complaint of a rash to the mid to upper back, just to the left of the midline, prior to the rash showing up she was having some pain 2 days ago, as well as a headache, the rash started today.  It is a small cluster of red bumps just to the left of the midline.  She states that it is tender to the touch, it is a burning sensation like pins-and-needles poking her.  She has never had anything like this before, she does recall having chickenpox as a child.  As a side note the patient has had some increasing shortness of breath this evening, she has been using her inhaler however she is still wheezing, she has not been on steroids recently.  Review of the medical record shows that the last time the patient was in the hospital was in December 2019 for a COPD exacerbation.  Past Medical History:  Diagnosis Date  . Alcohol abuse   . Anemia 01/31/2011  . Asthma   . COPD (chronic obstructive pulmonary disease) ([place])   . Depression   . Hypoxia 02/01/2011  . Needs sleep apnea assessment 2013.07.31  . Tobacco abuse     Patient Active Problem List   Diagnosis Date Noted  . Acute on chronic respiratory failure ([place]) 02/04/2018  . Acute respiratory failure with hypoxia ([place]) 04/29/2017  . Leukocytosis 12/07/2016  . Rhinovirus infection 12/07/2016  . Acute on chronic respiratory failure with hypoxia ([place]) 12/06/2016  . COPD with acute exacerbation ([place]) 12/06/2016  . Hypokalemia 02/10/2016  . Hypothyroidism 02/10/2016  . CAP (community acquired pneumonia) 01/19/2013  . Dehydration 01/19/2013  . Nausea vomiting and diarrhea 01/19/2013  . Alcohol dependence ([place])  01/16/2012  . Alcohol withdrawal ([place]) 01/16/2012  . Hypoxia 02/01/2011  . Hyponatremia 02/01/2011  . Anemia 01/31/2011  . Chest pain, pleuritic 01/29/2011  . Back pain 01/29/2011  . HCAP (healthcare-associated pneumonia) 01/28/2011  . Acute bronchitis 01/08/2011  . COPD exacerbation ([place]) 01/08/2011  . Tobacco abuse 01/08/2011  . Polysubstance abuse ([place]) 01/08/2011  . Major depression 01/08/2011    Past Surgical History:  Procedure Laterality Date  . ANKLE GANGLION CYST EXCISION    . BACK SURGERY    . CESAREAN SECTION    . COLONOSCOPY WITH PROPOFOL N/A 06/27/2016   Procedure: COLONOSCOPY WITH PROPOFOL;  Surgeon: Jonathon Bellows, MD;  Location: [facility] ENDOSCOPY;  Service: Endoscopy;  Laterality: N/A;  . DILATION AND CURETTAGE OF UTERUS    . ESOPHAGOGASTRODUODENOSCOPY (EGD) WITH PROPOFOL N/A 06/27/2016   Procedure: ESOPHAGOGASTRODUODENOSCOPY (EGD) WITH PROPOFOL;  Surgeon: Jonathon Bellows, MD;  Location: [facility] ENDOSCOPY;  Service: Endoscopy;  Laterality: N/A;     OB History   No obstetric history on file.     Family History  Problem Relation Age of Onset  . Hypothyroidism Mother   . Coronary artery disease Mother   . Throat cancer Mother   . Diabetes Maternal Grandmother     Social History   Tobacco Use  . Smoking status: Current Every Day Smoker    Packs/day: 1.00    Years: 20.00    Pack years: 20.00  Types: Cigarettes    Last attempt to quit: 06/19/2012    Years since quitting: 6.6  . Smokeless tobacco: Never Used  . Tobacco comment: Quit dec. 2019.  Substance Use Topics  . Alcohol use: Yes  . Drug use: No    Types: "Crack" cocaine    Comment: crack-Not since September    Home Medications Prior to Admission medications   Medication Sig Start Date End Date Taking? Authorizing Provider  albuterol (PROVENTIL) (2.5 MG/3ML) 0.083% nebulizer solution Take 3 mLs (2.5 mg total) by nebulization every 6 (six) hours as needed for wheezing or shortness of breath. For asthma  12/07/16   Raiford Noble Latif, DO  albuterol (VENTOLIN HFA) 108 (90 Base) MCG/ACT inhaler Inhale 1-2 puffs into the lungs every 6 (six) hours as needed for wheezing or shortness of breath. 02/11/19   Noemi Chapel, MD  budesonide-formoterol [facility]) 160-4.5 MCG/ACT inhaler Inhale 2 puffs into the lungs 2 (two) times daily. 03/06/18   Flora Lipps, MD  FLUoxetine (PROZAC) 40 MG capsule Take 40 mg by mouth daily. 08/05/16   [provider]  Fluticasone-Salmeterol (ADVAIR) 500-50 MCG/DOSE AEPB Inhale 2 puffs into the lungs 2 (two) times daily.    [provider]  ipratropium (ATROVENT) 0.02 % nebulizer solution Inhale 0.5 mg into the lungs 4 (four) times daily as needed.     [provider]  levothyroxine (SYNTHROID, LEVOTHROID) 137 MCG tablet Take 137 mcg by mouth daily. 11/22/16   [provider]  montelukast (SINGULAIR) 10 MG tablet Take 10 mg by mouth at bedtime.    [provider]  predniSONE (DELTASONE) 20 MG tablet Take 2 tablets (40 mg total) by mouth daily. 02/11/19   Noemi Chapel, MD  sucralfate (CARAFATE) 1 g tablet Take 1 tablet by mouth 4 (four) times daily as needed. 03/19/17   [provider]  valACYclovir (VALTREX) 1000 MG tablet Take 1 tablet (1,000 mg total) by mouth 3 (three) times daily. 02/11/19   Noemi Chapel, MD    Allergies    Patient has no known allergies.  Review of Systems   Review of Systems  Constitutional: Negative for fever.  Respiratory: Positive for cough, shortness of breath and wheezing.   Skin: Positive for rash.  Neurological: Positive for headaches.    Physical Exam Updated Vital Signs BP (!) 133/59 (BP Location: Right Arm)   Pulse 86   Temp 98.3 F (36.8 C) (Oral)   Resp (!) 23   Ht 1.651 m (5\' 5" )   Wt 90.7 kg   LMP 01/26/2019 (Approximate)   SpO2 95%   BMI 33.28 kg/m   Physical Exam Vitals and nursing note reviewed.  Constitutional:      General: She is not in acute distress.     Appearance: She is well-developed.  HENT:     Head: Normocephalic and atraumatic.     Mouth/Throat:     Pharynx: No oropharyngeal exudate.  Eyes:     General: No scleral icterus.       Right eye: No discharge.        Left eye: No discharge.     Conjunctiva/sclera: Conjunctivae normal.     Pupils: Pupils are equal, round, and reactive to light.  Neck:     Thyroid: No thyromegaly.     Vascular: No JVD.  Cardiovascular:     Rate and Rhythm: Normal rate and regular rhythm.     Heart sounds: Normal heart sounds. No murmur. No friction rub. No gallop.  Pulmonary:     Effort: Pulmonary effort is normal. No respiratory distress.     Breath sounds: Wheezing present. No rales.  Abdominal:     General: Bowel sounds are normal. There is no distension.     Palpations: Abdomen is soft. There is no mass.     Tenderness: There is no abdominal tenderness.  Musculoskeletal:        General: No tenderness. Normal range of motion.     Cervical back: Normal range of motion and neck supple.  Lymphadenopathy:     Cervical: No cervical adenopathy.  Skin:    General: Skin is warm and dry.     Findings: Rash present. No erythema.     Comments: There is a cluster of small red vesicles on a red base on the left side of the thorax at approximately T4 level.  Neurological:     Mental Status: She is alert.     Coordination: Coordination normal.  Psychiatric:        Behavior: Behavior normal.     ED Results / Procedures / Treatments   Labs (all labs ordered are listed, but only abnormal results are displayed) Labs Reviewed - No data to display  EKG None  Radiology No results found.  Procedures Procedures (including critical care time)  Medications Ordered in ED Medications  predniSONE (DELTASONE) tablet 40 mg (has no administration in time range)  valACYclovir (VALTREX) tablet 1,000 mg (has no administration in time range)    ED Course  I have reviewed the triage vital signs and the  nursing notes.  Pertinent labs & imaging results that were available during my care of the patient were reviewed by me and considered in my medical decision making (see chart for details).    MDM Rules/Calculators/A&P                      The patient has a COPD exacerbation as well as what appears to be the shingles.  I have counseled the patient at length regarding her infectivity towards others as well as the treatment plan including Valtrex.  She will need a course of prednisone for COPD.  Her oxygenation is 96% on room air, she has an albuterol inhaler but will be given a refill as well.  The patient is agreeable to the plan first dose of medications given prior to discharge  Final Clinical Impression(s) / ED Diagnoses Final diagnoses:  Herpes zoster without complication  COPD exacerbation ([place])    Rx / DC Orders ED Discharge Orders         Ordered    valACYclovir (VALTREX) 1000 MG tablet  3 times daily     02/11/19 2315    predniSONE (DELTASONE) 20 MG tablet  Daily     02/11/19 2315    albuterol (VENTOLIN HFA) 108 (90 Base) MCG/ACT inhaler  Every 6 hours PRN     02/11/19 2315           Noemi Chapel, MD 02/11/19 2317

## 2019-09-19 ENCOUNTER — Encounter (HOSPITAL_COMMUNITY): Payer: Self-pay

## 2019-09-19 ENCOUNTER — Emergency Department (HOSPITAL_COMMUNITY): Payer: Commercial Managed Care - PPO

## 2019-09-19 ENCOUNTER — Emergency Department (HOSPITAL_COMMUNITY)
Admission: EM | Admit: 2019-09-19 | Discharge: 2019-09-19 | Disposition: A | Discharge status: Home / Self Care | Payer: Commercial Managed Care - PPO | Attending: Emergency Medicine | Admitting: Emergency Medicine

## 2019-09-19 DIAGNOSIS — J441 Chronic obstructive pulmonary disease with (acute) exacerbation: Secondary | ICD-10-CM | POA: Diagnosis not present

## 2019-09-19 DIAGNOSIS — F17210 Nicotine dependence, cigarettes, uncomplicated: Secondary | ICD-10-CM | POA: Insufficient documentation

## 2019-09-19 DIAGNOSIS — M549 Dorsalgia, unspecified: Secondary | ICD-10-CM | POA: Diagnosis present

## 2019-09-19 DIAGNOSIS — E039 Hypothyroidism, unspecified: Secondary | ICD-10-CM | POA: Diagnosis not present

## 2019-09-19 DIAGNOSIS — M5441 Lumbago with sciatica, right side: Secondary | ICD-10-CM | POA: Diagnosis not present

## 2019-09-19 LAB — CBC WITH DIFFERENTIAL/PLATELET
Abs Immature Granulocytes: 0.01 10*3/uL (ref 0.00–0.07)
Basophils Absolute: 0 10*3/uL (ref 0.0–0.1)
Basophils Relative: 0 %
Eosinophils Absolute: 0.3 10*3/uL (ref 0.0–0.5)
Eosinophils Relative: 4 %
HCT: 32.9 % — ABNORMAL LOW (ref 36.0–46.0)
Hemoglobin: 9.7 g/dL — ABNORMAL LOW (ref 12.0–15.0)
Immature Granulocytes: 0 %
Lymphocytes Relative: 21 %
Lymphs Abs: 1.5 10*3/uL (ref 0.7–4.0)
MCH: 23.5 pg — ABNORMAL LOW (ref 26.0–34.0)
MCHC: 29.5 g/dL — ABNORMAL LOW (ref 30.0–36.0)
MCV: 79.9 fL — ABNORMAL LOW (ref 80.0–100.0)
Monocytes Absolute: 0.4 10*3/uL (ref 0.1–1.0)
Monocytes Relative: 6 %
Neutro Abs: 4.8 10*3/uL (ref 1.7–7.7)
Neutrophils Relative %: 69 %
Platelets: 293 10*3/uL (ref 150–400)
RBC: 4.12 MIL/uL (ref 3.87–5.11)
RDW: 14.7 % (ref 11.5–15.5)
WBC: 7 10*3/uL (ref 4.0–10.5)
nRBC: 0 % (ref 0.0–0.2)

## 2019-09-19 LAB — URINALYSIS, ROUTINE W REFLEX MICROSCOPIC
Bilirubin Urine: NEGATIVE
Glucose, UA: NEGATIVE mg/dL
Hgb urine dipstick: NEGATIVE
Ketones, ur: NEGATIVE mg/dL
Nitrite: NEGATIVE
Protein, ur: NEGATIVE mg/dL
Specific Gravity, Urine: 1.01 (ref 1.005–1.030)
pH: 7 (ref 5.0–8.0)

## 2019-09-19 LAB — I-STAT BETA HCG BLOOD, ED (MC, WL, AP ONLY): I-stat hCG, quantitative: <5 m[IU]/mL (ref <5)

## 2019-09-19 LAB — COMPREHENSIVE METABOLIC PANEL
ALT: 13 U/L (ref 0–44)
AST: 15 U/L (ref 15–41)
Albumin: 3.7 g/dL (ref 3.5–5.0)
Alkaline Phosphatase: 58 U/L (ref 38–126)
Anion gap: 8 (ref 5–15)
BUN: 18 mg/dL (ref 6–20)
CO2: 24 mmol/L (ref 22–32)
Calcium: 8.8 mg/dL — ABNORMAL LOW (ref 8.9–10.3)
Chloride: 105 mmol/L (ref 98–111)
Creatinine, Ser: 0.55 mg/dL (ref 0.44–1.00)
GFR calc Af Amer: >60 mL/min (ref >60)
GFR calc non Af Amer: >60 mL/min (ref >60)
Glucose, Bld: 91 mg/dL (ref 70–99)
Potassium: 3.9 mmol/L (ref 3.5–5.1)
Sodium: 137 mmol/L (ref 135–145)
Total Bilirubin: 0.5 mg/dL (ref 0.3–1.2)
Total Protein: 6.8 g/dL (ref 6.5–8.1)

## 2019-09-19 MED ORDER — LIDOCAINE 5 % EX OINT: 1.0000 "application " | TOPICAL_OINTMENT | Freq: Three times a day (TID) | CUTANEOUS | 0 refills | Status: DC | PRN

## 2019-09-19 MED ORDER — ONDANSETRON 4 MG PO TBDP
4.0000 mg | ORAL_TABLET | Freq: Once | ORAL | Status: AC
  Administered 2019-09-19: 4 mg via ORAL
  Filled 2019-09-19: qty 1

## 2019-09-19 MED ORDER — DIAZEPAM 5 MG/ML IJ SOLN
5.0000 mg | Freq: Once | INTRAMUSCULAR | Status: AC
  Administered 2019-09-19: 5 mg via INTRAMUSCULAR
  Filled 2019-09-19: qty 2

## 2019-09-19 MED ORDER — MORPHINE SULFATE (PF) 4 MG/ML IV SOLN
4.0000 mg | Freq: Once | INTRAVENOUS | Status: AC
  Administered 2019-09-19: 4 mg via INTRAVENOUS
  Filled 2019-09-19: qty 1

## 2019-09-19 MED ORDER — OXYCODONE-ACETAMINOPHEN 5-325 MG PO TABS
1.0000 | ORAL_TABLET | Freq: Once | ORAL | Status: AC
  Administered 2019-09-19: 1 via ORAL
  Filled 2019-09-19: qty 1

## 2019-09-19 MED ORDER — KETOROLAC TROMETHAMINE 30 MG/ML IJ SOLN
30.0000 mg | Freq: Once | INTRAMUSCULAR | Status: AC
  Administered 2019-09-19: 30 mg via INTRAVENOUS
  Filled 2019-09-19: qty 1

## 2019-09-19 MED ORDER — DIAZEPAM 5 MG PO TABS: 5.0000 mg | ORAL_TABLET | Freq: Three times a day (TID) | ORAL | 0 refills | Status: DC | PRN

## 2019-09-19 MED ORDER — PREDNISONE 10 MG PO TABS: 40.0000 mg | ORAL_TABLET | Freq: Every day | ORAL | 0 refills | Status: AC

## 2019-09-19 NOTE — Discharge Instructions (Signed)
You have been seen in the Emergency Department (ED)  today for back pain.  Your workup and exam have not shown any acute abnormalities and you are likely suffering from muscle strain or possible problems with your discs, but there is no treatment that will fix your symptoms at this time.   Take Valium as prescribed for severe pain. Do not drink alcohol, drive or participate in any other potentially dangerous activities while taking this medication as it may make you sleepy. Do not take this medication with any other sedating medications, either prescription or over-the-counter.  Please follow up with your doctor as soon as possible regarding today's ED visit and your back pain.  Return to the ED for worsening back pain, fever, weakness or numbness of either leg, or if you develop either (1) an inability to urinate or have bowel movements, or (2) loss of your ability to control your bathroom functions (if you start having "accidents"), or if you develop other new symptoms that concern you.

## 2019-09-19 NOTE — ED Triage Notes (Signed)
Pt having lower back spasms. Had back surgery 20 years ago. Pt screaming in pain.

## 2019-09-19 NOTE — ED Provider Notes (Signed)
Emergency Department Provider Note   I have reviewed the triage vital signs and the nursing notes.   HISTORY  Chief Complaint back spasm   HPI [name] is a 44 y.o. female with past medical history reviewed below presents to the emergency department with acute onset, severe back pain.  Patient reports severe spasm-like pain which began after trying to get out of bed this morning.  She has a remote history of bulging disc which required surgery approximately 20 years ago but does not have chronic pain from this.  She did have a fall in the past several days which is mechanical.  She landed on her right side and developed some mild back discomfort after this.  Symptoms have been waxing and waning but became severe this morning.  She is not having any bowel or bladder incontinence.  No urinary retention symptoms.  No numbness or tingling in the bilateral lower extremities.  She does feel a pain running through the right leg primarily in addition to her lower back pain.  Denies fevers or chills.  No IVDA history. No groin numbness.    Past Medical History:  Diagnosis Date  . Alcohol abuse   . Anemia 01/31/2011  . Asthma   . COPD (chronic obstructive pulmonary disease) ([place])   . Depression   . Hypoxia 02/01/2011  . Needs sleep apnea assessment 2013.07.31  . Tobacco abuse     Patient Active Problem List   Diagnosis Date Noted  . Acute on chronic respiratory failure ([place]) 02/04/2018  . Acute respiratory failure with hypoxia ([place]) 04/29/2017  . Leukocytosis 12/07/2016  . Rhinovirus infection 12/07/2016  . Acute on chronic respiratory failure with hypoxia ([place]) 12/06/2016  . COPD with acute exacerbation ([place]) 12/06/2016  . Hypokalemia 02/10/2016  . Hypothyroidism 02/10/2016  . CAP (community acquired pneumonia) 01/19/2013  . Dehydration 01/19/2013  . Nausea vomiting and diarrhea 01/19/2013  . Alcohol dependence ([place]) 01/16/2012  . Alcohol withdrawal ([place]) 01/16/2012    . Hypoxia 02/01/2011  . Hyponatremia 02/01/2011  . Anemia 01/31/2011  . Chest pain, pleuritic 01/29/2011  . Back pain 01/29/2011  . HCAP (healthcare-associated pneumonia) 01/28/2011  . Acute bronchitis 01/08/2011  . COPD exacerbation ([place]) 01/08/2011  . Tobacco abuse 01/08/2011  . Polysubstance abuse ([place]) 01/08/2011  . Major depression 01/08/2011    Past Surgical History:  Procedure Laterality Date  . ANKLE GANGLION CYST EXCISION    . BACK SURGERY    . CESAREAN SECTION    . COLONOSCOPY WITH PROPOFOL N/A 06/27/2016   Procedure: COLONOSCOPY WITH PROPOFOL;  Surgeon: Jonathon Bellows, MD;  Location: [facility] ENDOSCOPY;  Service: Endoscopy;  Laterality: N/A;  . DILATION AND CURETTAGE OF UTERUS    . ESOPHAGOGASTRODUODENOSCOPY (EGD) WITH PROPOFOL N/A 06/27/2016   Procedure: ESOPHAGOGASTRODUODENOSCOPY (EGD) WITH PROPOFOL;  Surgeon: Jonathon Bellows, MD;  Location: [facility] ENDOSCOPY;  Service: Endoscopy;  Laterality: N/A;    Allergies Patient has no known allergies.  Family History  Problem Relation Age of Onset  . Hypothyroidism Mother   . Coronary artery disease Mother   . Throat cancer Mother   . Diabetes Maternal Grandmother     Social History Social History   Tobacco Use  . Smoking status: Current Every Day Smoker    Packs/day: 1.00    Years: 20.00    Pack years: 20.00    Types: Cigarettes    Last attempt to quit: 06/19/2012    Years since quitting: 7.2  . Smokeless tobacco: Never Used  . Tobacco  comment: Quit dec. 2019.  Vaping Use  . Vaping Use: Some days  Substance Use Topics  . Alcohol use: Yes  . Drug use: No    Types: "Crack" cocaine    Comment: crack-Not since September    Review of Systems  Constitutional: No fever/chills Eyes: No visual changes. ENT: No sore throat. Cardiovascular: Denies chest pain. Respiratory: Denies shortness of breath. Gastrointestinal: No abdominal pain.  No nausea, no vomiting.  No diarrhea.  No constipation. Genitourinary: Negative for  dysuria. Musculoskeletal: Positive for back pain and right leg pain.  Skin: Negative for rash. Neurological: Negative for headaches, focal weakness or numbness.  10-point ROS otherwise negative.  ____________________________________________   PHYSICAL EXAM:  VITAL SIGNS: ED Triage Vitals [09/19/19 0909]  Enc Vitals Group     BP (!) 172/82     Pulse Rate 98     Resp (!) 24     Temp 97.9 F (36.6 C)     Temp Source Oral     SpO2 96 %   Constitutional: Alert and oriented. Appears uncomfortable with movement and intermittently screaming out in pain with spasm in the lower back.  Eyes: Conjunctivae are normal. Head: Atraumatic. Nose: No congestion/rhinnorhea. Mouth/Throat: Mucous membranes are moist.   Neck: No stridor.   Cardiovascular: Normal rate, regular rhythm. Good peripheral circulation. Grossly normal heart sounds.   Respiratory: Normal respiratory effort.  No retractions. Lungs CTAB. Gastrointestinal: Soft and nontender. No distention.  Musculoskeletal: Pain with straightening both legs.  No tenderness over the knees or ankles.  No deformity. Neurologic:  Normal speech and language.  Normal strength and sensation in the bilateral lower extremities.  Patient with subjective sensory difference over the right lateral calf but normal sensation medially and in the thigh area.  Normal sensation in the feet in all dermatomes. 2+ patellar reflexes bilaterally.  Skin:  Skin is warm, dry and intact. No rash noted.  ____________________________________________   LABS (all labs ordered are listed, but only abnormal results are displayed)  Labs Reviewed  COMPREHENSIVE METABOLIC PANEL - Abnormal; Notable for the following components:      Result Value   Calcium 8.8 (*)    All other components within normal limits  CBC WITH DIFFERENTIAL/PLATELET - Abnormal; Notable for the following components:   Hemoglobin 9.7 (*)    HCT 32.9 (*)    MCV 79.9 (*)    MCH 23.5 (*)    MCHC 29.5  (*)    All other components within normal limits  URINALYSIS, ROUTINE W REFLEX MICROSCOPIC - Abnormal; Notable for the following components:   APPearance HAZY (*)    Leukocytes,Ua TRACE (*)    Bacteria, UA RARE (*)    All other components within normal limits  I-STAT BETA HCG BLOOD, ED ([facility], [facility], [facility] ONLY)   ____________________________________________  RADIOLOGY  CT Lumbar Spine Wo Contrast  Result Date: 09/19/2019 CLINICAL DATA:  Low back spasms for 3 days after falling 6 days ago. Remote back surgery. EXAM: CT LUMBAR SPINE WITHOUT CONTRAST TECHNIQUE: Multidetector CT imaging of the lumbar spine was performed without intravenous contrast administration. Multiplanar CT image reconstructions were also generated. COMPARISON:  Lumbar spine CT 06/30/2005. Abdominopelvic CT 05/07/2016. FINDINGS: Segmentation: There are 5 lumbar type vertebral bodies. Hypoplastic ribs are present at T12. Alignment: Normal. Vertebrae: No evidence of acute fracture or pars defect. Progressive loss of disc height and endplate sclerosis at O8-7 compared with remote lumbar spine CT. The visualized sacroiliac joints appear normal. Paraspinal and other soft tissues:  Unremarkable. Disc levels: At T12-L1, L1-2 and L2-3, the disc heights are maintained and there is no significant spinal stenosis. L3-4: Mild loss of disc height with mild disc bulging, facet and ligamentous hypertrophy. No significant spinal stenosis or nerve root encroachment. L4-5: Chronic spondylosis with loss of disc height, annular disc bulging and endplate osteophytes. These findings have mildly progressed from 2007, but are similar to the more recent abdominal CT. Previous posterior decompression with adequate patency of the spinal canal. There is mild narrowing of the right lateral recess and both foramina. L5-S1: Preserved disc height. Mild bilateral facet hypertrophy. No significant spinal stenosis or nerve root encroachment. IMPRESSION: 1. No acute findings or  explanation for the patient's symptoms. 2. Chronic spondylosis at L4-5 with mild narrowing of the right lateral recess and both foramina. Previous posterior decompression with adequate patency of the spinal canal. Electronically Signed   By: Richardean Sale [name]   On: 09/19/2019 12:49    ____________________________________________   PROCEDURES  Procedure(s) performed:   Procedures  None  ____________________________________________   INITIAL IMPRESSION / ASSESSMENT AND PLAN / ED COURSE  Pertinent labs & imaging results that were available during my care of the patient were reviewed by me and considered in my medical decision making (see chart for details).   Patient presents emergency department for evaluation of spasming lower back pain becoming severe this morning.  There was recent trauma but no severe pain after this.  Patient was given IM Valium on arrival with muscle spasm as the likely etiology for her symptoms.  No red flag signs or symptoms to prompt emergency MRI of the lumbar spine.  Given her level of pain, however, I do plan for CT imaging of the lumbar spine to assess for underlying compression fracture in the setting of recent trauma.  We will continue to control pain symptoms and reassess.  02:15 PM  Patient CT imaging reviewed showing chronic spondylosis at L4/L5 with mild narrowing there.  No acute fracture noted.  On reassessment patient is continuing to have some pain but is able to move more easily in the bed.  She is no longer having the spasming type pain.  She has been able to get up and walk here in the emergency department.   My suspicion for spine emergency and emergent MRI is exceedingly low.  Will provide contact information for the spine center in [place].  I have called in a prescription for Valium along with prednisone, topical lidocaine and will have the patient take his medications as needed for symptom management at home.  Advised to try and stay  somewhat active if possible and not to just lie in bed.  She will call her PCP as well as the spine center on Monday.  We discussed symptoms and warning signs that should prompt ED return and these were provided in writing as well.  Reviewed the [facility] drug database prior to prescribing Valium.  Discussed the drowsy side effects as well as medication interactions with the patient in detail.  ____________________________________________  FINAL CLINICAL IMPRESSION(S) / ED DIAGNOSES  Final diagnoses:  Acute midline low back pain with right-sided sciatica     MEDICATIONS GIVEN DURING THIS VISIT:  Medications  oxyCODONE-acetaminophen (PERCOCET/ROXICET) 5-325 MG per tablet 1 tablet (has no administration in time range)  ondansetron (ZOFRAN-ODT) disintegrating tablet 4 mg (has no administration in time range)  diazepam (VALIUM) injection 5 mg (5 mg Intramuscular Given 09/19/19 0908)  ketorolac (TORADOL) 30 MG/ML injection 30 mg (30  mg Intravenous Given 09/19/19 1048)  morphine 4 MG/ML injection 4 mg (4 mg Intravenous Given 09/19/19 1231)     NEW OUTPATIENT MEDICATIONS STARTED DURING THIS VISIT:  New Prescriptions   DIAZEPAM (VALIUM) 5 MG TABLET    Take 1 tablet (5 mg total) by mouth every 8 (eight) hours as needed for muscle spasms.   LIDOCAINE (XYLOCAINE) 5 % OINTMENT    Apply 1 application topically 3 (three) times daily as needed.   PREDNISONE (DELTASONE) 10 MG TABLET    Take 4 tablets (40 mg total) by mouth daily for 5 days.    Note:  This document was prepared using Dragon voice recognition software and may include unintentional dictation errors.  Nanda Quinton, MD, [facility] Emergency Medicine    Analiya Porco, Wonda Olds, MD 09/20/19 [phone]

## 2019-09-19 NOTE — ED Notes (Signed)
Dr. Laverta Baltimore stated patient needed restroom and would attempt to go to the bathroom if we rolled stretcher up to the door.  Patient stated she would attempt.  I stating to the patient I was sorry but this was our only option.  Patient became upset because she was tired of being told there were no room.  I explained to the patient that we just wanted to make her aware of why she was still in the hallway.  Patient stated "she could be transferred to [facility]".  I advised patient that this situation was just as bad at any hospital she went to.  Patient was rolled to restroom and she stated "I am not trying just roll me back to where I was at and I want to talk to the doctor".   Patient refused to attempt getting up.  Patient brought back to [facility].

## 2019-09-30 ENCOUNTER — Other Ambulatory Visit: Payer: Commercial Managed Care - PPO

## 2019-10-03 ENCOUNTER — Ambulatory Visit: Payer: Self-pay | Admitting: Pulmonary Disease

## 2019-10-09 ENCOUNTER — Other Ambulatory Visit: Payer: Self-pay

## 2019-10-09 ENCOUNTER — Other Ambulatory Visit: Payer: Commercial Managed Care - PPO

## 2019-10-09 DIAGNOSIS — Z20822 Contact with and (suspected) exposure to covid-19: Secondary | ICD-10-CM

## 2019-10-11 LAB — NOVEL CORONAVIRUS, NAA: SARS-CoV-2, NAA: NOT DETECTED

## 2019-10-11 LAB — SARS-COV-2, NAA 2 DAY TAT

## 2019-10-22 ENCOUNTER — Other Ambulatory Visit: Payer: Commercial Managed Care - PPO

## 2019-10-22 ENCOUNTER — Other Ambulatory Visit: Payer: Self-pay | Admitting: Critical Care Medicine

## 2019-10-22 DIAGNOSIS — Z20822 Contact with and (suspected) exposure to covid-19: Secondary | ICD-10-CM

## 2019-10-24 ENCOUNTER — Encounter (HOSPITAL_COMMUNITY): Payer: Self-pay | Admitting: Emergency Medicine

## 2019-10-24 ENCOUNTER — Observation Stay (HOSPITAL_COMMUNITY)
Admission: EM | Admit: 2019-10-24 | Discharge: 2019-10-25 | Disposition: A | Discharge status: Home / Self Care | Payer: Commercial Managed Care - PPO | Attending: Internal Medicine | Admitting: Internal Medicine

## 2019-10-24 ENCOUNTER — Other Ambulatory Visit: Payer: Self-pay

## 2019-10-24 ENCOUNTER — Emergency Department (HOSPITAL_COMMUNITY): Payer: Commercial Managed Care - PPO

## 2019-10-24 ENCOUNTER — Other Ambulatory Visit (HOSPITAL_COMMUNITY): Payer: Self-pay

## 2019-10-24 DIAGNOSIS — R0602 Shortness of breath: Secondary | ICD-10-CM | POA: Diagnosis present

## 2019-10-24 DIAGNOSIS — E039 Hypothyroidism, unspecified: Secondary | ICD-10-CM

## 2019-10-24 DIAGNOSIS — J181 Lobar pneumonia, unspecified organism: Secondary | ICD-10-CM | POA: Diagnosis not present

## 2019-10-24 DIAGNOSIS — Z79899 Other long term (current) drug therapy: Secondary | ICD-10-CM | POA: Diagnosis not present

## 2019-10-24 DIAGNOSIS — J9621 Acute and chronic respiratory failure with hypoxia: Secondary | ICD-10-CM | POA: Insufficient documentation

## 2019-10-24 DIAGNOSIS — J9620 Acute and chronic respiratory failure, unspecified whether with hypoxia or hypercapnia: Secondary | ICD-10-CM | POA: Diagnosis present

## 2019-10-24 DIAGNOSIS — J441 Chronic obstructive pulmonary disease with (acute) exacerbation: Secondary | ICD-10-CM | POA: Diagnosis not present

## 2019-10-24 DIAGNOSIS — Z20822 Contact with and (suspected) exposure to covid-19: Secondary | ICD-10-CM | POA: Diagnosis not present

## 2019-10-24 DIAGNOSIS — F329 Major depressive disorder, single episode, unspecified: Secondary | ICD-10-CM | POA: Insufficient documentation

## 2019-10-24 HISTORY — DX: Other specified health status: Z78.9

## 2019-10-24 LAB — CBC
HCT: 37.5 % (ref 36.0–46.0)
Hemoglobin: 10.7 g/dL — ABNORMAL LOW (ref 12.0–15.0)
MCH: 23.6 pg — ABNORMAL LOW (ref 26.0–34.0)
MCHC: 28.5 g/dL — ABNORMAL LOW (ref 30.0–36.0)
MCV: 82.6 fL (ref 80.0–100.0)
Platelets: 342 10*3/uL (ref 150–400)
RBC: 4.54 MIL/uL (ref 3.87–5.11)
RDW: 18.3 % — ABNORMAL HIGH (ref 11.5–15.5)
WBC: 9.9 10*3/uL (ref 4.0–10.5)
nRBC: 0 % (ref 0.0–0.2)

## 2019-10-24 LAB — HIV ANTIBODY (ROUTINE TESTING W REFLEX): HIV Screen 4th Generation wRfx: NONREACTIVE

## 2019-10-24 LAB — BASIC METABOLIC PANEL
Anion gap: 8 (ref 5–15)
BUN: 17 mg/dL (ref 6–20)
CO2: 24 mmol/L (ref 22–32)
Calcium: 8.9 mg/dL (ref 8.9–10.3)
Chloride: 106 mmol/L (ref 98–111)
Creatinine, Ser: 0.8 mg/dL (ref 0.44–1.00)
GFR calc Af Amer: >60 mL/min (ref >60)
GFR calc non Af Amer: >60 mL/min (ref >60)
Glucose, Bld: 111 mg/dL — ABNORMAL HIGH (ref 70–99)
Potassium: 3.7 mmol/L (ref 3.5–5.1)
Sodium: 138 mmol/L (ref 135–145)

## 2019-10-24 LAB — TROPONIN I (HIGH SENSITIVITY)
Troponin I (High Sensitivity): 2 ng/L (ref <18)
Troponin I (High Sensitivity): <2 ng/L (ref <18)

## 2019-10-24 LAB — PHOSPHORUS: Phosphorus: 3.8 mg/dL (ref 2.5–4.6)

## 2019-10-24 LAB — GLUCOSE, CAPILLARY: Glucose-Capillary: 113 mg/dL — ABNORMAL HIGH (ref 70–99)

## 2019-10-24 LAB — SARS CORONAVIRUS 2 BY RT PCR (HOSPITAL ORDER, PERFORMED IN ~~LOC~~ HOSPITAL LAB): SARS Coronavirus 2: NEGATIVE

## 2019-10-24 LAB — MAGNESIUM: Magnesium: 1.9 mg/dL (ref 1.7–2.4)

## 2019-10-24 MED ORDER — IPRATROPIUM-ALBUTEROL 0.5-2.5 (3) MG/3ML IN SOLN
3.0000 mL | Freq: Four times a day (QID) | RESPIRATORY_TRACT | Status: DC
  Administered 2019-10-24 – 2019-10-25 (×6): 3 mL via RESPIRATORY_TRACT
  Filled 2019-10-24 (×5): qty 3

## 2019-10-24 MED ORDER — AZITHROMYCIN 250 MG PO TABS
500.0000 mg | ORAL_TABLET | Freq: Every day | ORAL | Status: AC
  Administered 2019-10-24: 500 mg via ORAL
  Filled 2019-10-24: qty 2

## 2019-10-24 MED ORDER — ALBUTEROL SULFATE HFA 108 (90 BASE) MCG/ACT IN AERS
6.0000 | INHALATION_SPRAY | Freq: Once | RESPIRATORY_TRACT | Status: AC
  Administered 2019-10-24: 6 via RESPIRATORY_TRACT
  Filled 2019-10-24: qty 6.7

## 2019-10-24 MED ORDER — ALBUTEROL SULFATE (2.5 MG/3ML) 0.083% IN NEBU: 2.5000 mg | INHALATION_SOLUTION | RESPIRATORY_TRACT | Status: DC | PRN

## 2019-10-24 MED ORDER — AZITHROMYCIN 250 MG PO TABS: 250.0000 mg | ORAL_TABLET | Freq: Every day | ORAL | Status: DC

## 2019-10-24 MED ORDER — LEVOTHYROXINE SODIUM 137 MCG PO TABS
137.0000 ug | ORAL_TABLET | Freq: Every day | ORAL | Status: DC
  Administered 2019-10-24 – 2019-10-25 (×2): 137 ug via ORAL
  Filled 2019-10-24 (×5): qty 1

## 2019-10-24 MED ORDER — PANTOPRAZOLE SODIUM 40 MG PO TBEC
40.0000 mg | DELAYED_RELEASE_TABLET | Freq: Every day | ORAL | Status: DC
  Administered 2019-10-24 – 2019-10-25 (×2): 40 mg via ORAL
  Filled 2019-10-24 (×2): qty 1

## 2019-10-24 MED ORDER — IPRATROPIUM-ALBUTEROL 0.5-2.5 (3) MG/3ML IN SOLN: 3.0000 mL | Freq: Four times a day (QID) | RESPIRATORY_TRACT | Status: DC

## 2019-10-24 MED ORDER — IPRATROPIUM-ALBUTEROL 0.5-2.5 (3) MG/3ML IN SOLN: 3.0000 mL | RESPIRATORY_TRACT | Status: DC | PRN

## 2019-10-24 MED ORDER — SODIUM CHLORIDE 0.9 % IV SOLN: 500.0000 mg | INTRAVENOUS | Status: DC

## 2019-10-24 MED ORDER — ALBUTEROL (5 MG/ML) CONTINUOUS INHALATION SOLN
5.0000 mg/h | INHALATION_SOLUTION | Freq: Once | RESPIRATORY_TRACT | Status: AC
  Administered 2019-10-24: 5 mg/h via RESPIRATORY_TRACT
  Filled 2019-10-24: qty 20

## 2019-10-24 MED ORDER — GUAIFENESIN ER 600 MG PO TB12
600.0000 mg | ORAL_TABLET | Freq: Two times a day (BID) | ORAL | Status: DC
  Administered 2019-10-24 – 2019-10-25 (×3): 600 mg via ORAL
  Filled 2019-10-24 (×2): qty 1

## 2019-10-24 MED ORDER — DM-GUAIFENESIN ER 30-600 MG PO TB12
1.0000 | ORAL_TABLET | Freq: Two times a day (BID) | ORAL | Status: DC
  Administered 2019-10-24 – 2019-10-25 (×3): 1 via ORAL
  Filled 2019-10-24 (×4): qty 1

## 2019-10-24 MED ORDER — SODIUM CHLORIDE 0.9 % IV SOLN
1.0000 g | INTRAVENOUS | Status: DC
  Administered 2019-10-24 – 2019-10-25 (×2): 1 g via INTRAVENOUS
  Filled 2019-10-24 (×2): qty 10

## 2019-10-24 MED ORDER — METHYLPREDNISOLONE SODIUM SUCC 40 MG IJ SOLR
40.0000 mg | Freq: Two times a day (BID) | INTRAMUSCULAR | Status: DC
  Administered 2019-10-24 – 2019-10-25 (×3): 40 mg via INTRAVENOUS
  Filled 2019-10-24 (×3): qty 1

## 2019-10-24 MED ORDER — METHYLPREDNISOLONE SODIUM SUCC 125 MG IJ SOLR
125.0000 mg | Freq: Once | INTRAMUSCULAR | Status: AC
  Administered 2019-10-24: 125 mg via INTRAVENOUS
  Filled 2019-10-24: qty 2

## 2019-10-24 MED ORDER — ENOXAPARIN SODIUM 40 MG/0.4ML ~~LOC~~ SOLN
40.0000 mg | SUBCUTANEOUS | Status: DC
  Administered 2019-10-24 – 2019-10-25 (×2): 40 mg via SUBCUTANEOUS
  Filled 2019-10-24 (×2): qty 0.4

## 2019-10-24 NOTE — Plan of Care (Addendum)
  Problem: Acute Rehab PT Goals(only PT should resolve) Goal: Pt Will Go Supine/Side To Sit Outcome: Progressing Flowsheets (Taken 10/24/2019 0921) Pt will go Supine/Side to Sit: Independently Goal: Patient Will Transfer Sit To/From Stand Outcome: Progressing Flowsheets (Taken 10/24/2019 0921) Patient will transfer sit to/from stand: Independently Goal: Pt Will Transfer Bed To Chair/Chair To Bed Outcome: Progressing Flowsheets (Taken 10/24/2019 0921) Pt will Transfer Bed to Chair/Chair to Bed: Independently Goal: Pt Will Ambulate Outcome: Progressing Flowsheets (Taken 10/24/2019 0921) Pt will Ambulate: . > 125 feet . Independently   9:22 AM , 10/24/19 Karlyn Agee, SPT Physical Therapy with [facility] [phone] office  During this treatment session, the therapist was present, participating in and directing the treatment.  9:59 AM, 10/24/19 Lonell Grandchild, MPT Physical Therapist with [facility] [phone] office [phone] mobile phone

## 2019-10-24 NOTE — ED Provider Notes (Signed)
[facility] EMERGENCY DEPARTMENT Provider Note   CSN: [id number] Arrival date & time: 10/24/19  0055     History Chief Complaint  Patient presents with  . Shortness of Breath    [name] is a 44 y.o. female.  Patient with past medical history of COPD, depression, anemia presenting for evaluation of shortness of breath.  Patient states she began having some wheezing and feeling poorly earlier today.  She reports exposure to Covid several weeks ago, but denies any fevers or chills.  She does feel tight in the chest.  She has tried using her nebulizer at home with little relief.  Patient has no prior cardiac history.  The history is provided by the patient.  Shortness of Breath Severity:  Moderate Onset quality:  Sudden Duration:  1 day Timing:  Constant Progression:  Worsening Chronicity:  Recurrent Relieved by:  Nothing Worsened by:  Nothing Ineffective treatments: Nebulizer.      Past Medical History:  Diagnosis Date  . Alcohol abuse   . Anemia 01/31/2011  . Asthma   . COPD (chronic obstructive pulmonary disease) ([place])   . Depression   . Hypoxia 02/01/2011  . Needs sleep apnea assessment 2013.07.31  . Tobacco abuse     Patient Active Problem List   Diagnosis Date Noted  . Acute on chronic respiratory failure ([place]) 02/04/2018  . Acute respiratory failure with hypoxia ([place]) 04/29/2017  . Leukocytosis 12/07/2016  . Rhinovirus infection 12/07/2016  . Acute on chronic respiratory failure with hypoxia ([place]) 12/06/2016  . COPD with acute exacerbation ([place]) 12/06/2016  . Hypokalemia 02/10/2016  . Hypothyroidism 02/10/2016  . CAP (community acquired pneumonia) 01/19/2013  . Dehydration 01/19/2013  . Nausea vomiting and diarrhea 01/19/2013  . Alcohol dependence ([place]) 01/16/2012  . Alcohol withdrawal ([place]) 01/16/2012  . Hypoxia 02/01/2011  . Hyponatremia 02/01/2011  . Anemia 01/31/2011  . Chest pain, pleuritic 01/29/2011  . Back pain 01/29/2011  .  HCAP (healthcare-associated pneumonia) 01/28/2011  . Acute bronchitis 01/08/2011  . COPD exacerbation ([place]) 01/08/2011  . Tobacco abuse 01/08/2011  . Polysubstance abuse ([place]) 01/08/2011  . Major depression 01/08/2011    Past Surgical History:  Procedure Laterality Date  . ANKLE GANGLION CYST EXCISION    . BACK SURGERY    . CESAREAN SECTION    . COLONOSCOPY WITH PROPOFOL N/A 06/27/2016   Procedure: COLONOSCOPY WITH PROPOFOL;  Surgeon: Jonathon Bellows, MD;  Location: [facility] ENDOSCOPY;  Service: Endoscopy;  Laterality: N/A;  . DILATION AND CURETTAGE OF UTERUS    . ESOPHAGOGASTRODUODENOSCOPY (EGD) WITH PROPOFOL N/A 06/27/2016   Procedure: ESOPHAGOGASTRODUODENOSCOPY (EGD) WITH PROPOFOL;  Surgeon: Jonathon Bellows, MD;  Location: [facility] ENDOSCOPY;  Service: Endoscopy;  Laterality: N/A;     OB History   No obstetric history on file.     Family History  Problem Relation Age of Onset  . Hypothyroidism Mother   . Coronary artery disease Mother   . Throat cancer Mother   . Diabetes Maternal Grandmother     Social History   Tobacco Use  . Smoking status: Current Every Day Smoker    Packs/day: 1.00    Years: 20.00    Pack years: 20.00    Types: Cigarettes    Last attempt to quit: 06/19/2012    Years since quitting: 7.3  . Smokeless tobacco: Never Used  . Tobacco comment: Quit dec. 2019.  Vaping Use  . Vaping Use: Some days  Substance Use Topics  . Alcohol use: Yes  . Drug use: No  Types: "Crack" cocaine    Comment: crack-Not since September    Home Medications Prior to Admission medications   Medication Sig Start Date End Date Taking? Authorizing Provider  albuterol (PROVENTIL) (2.5 MG/3ML) 0.083% nebulizer solution Take 3 mLs (2.5 mg total) by nebulization every 6 (six) hours as needed for wheezing or shortness of breath. For asthma 12/07/16   Raiford Noble Latif, DO  albuterol (VENTOLIN HFA) 108 (90 Base) MCG/ACT inhaler Inhale 1-2 puffs into the lungs every 6 (six) hours as needed  for wheezing or shortness of breath. 02/11/19   Noemi Chapel, MD  budesonide-formoterol [facility]) 160-4.5 MCG/ACT inhaler Inhale 2 puffs into the lungs 2 (two) times daily. 03/06/18   Flora Lipps, MD  diazepam (VALIUM) 5 MG tablet Take 1 tablet (5 mg total) by mouth every 8 (eight) hours as needed for muscle spasms. 09/19/19   Long, Wonda Olds, MD  FLUoxetine (PROZAC) 40 MG capsule Take 40 mg by mouth daily. 08/05/16   [provider]  Fluticasone-Salmeterol (ADVAIR) 500-50 MCG/DOSE AEPB Inhale 2 puffs into the lungs 2 (two) times daily.    [provider]  ipratropium (ATROVENT) 0.02 % nebulizer solution Inhale 0.5 mg into the lungs 4 (four) times daily as needed.     [provider]  levothyroxine (SYNTHROID, LEVOTHROID) 137 MCG tablet Take 137 mcg by mouth daily. 11/22/16   [provider]  lidocaine (XYLOCAINE) 5 % ointment Apply 1 application topically 3 (three) times daily as needed. 09/19/19   Long, Wonda Olds, MD  montelukast (SINGULAIR) 10 MG tablet Take 10 mg by mouth at bedtime.    [provider]  sucralfate (CARAFATE) 1 g tablet Take 1 tablet by mouth 4 (four) times daily as needed. 03/19/17   [provider]  valACYclovir (VALTREX) 1000 MG tablet Take 1 tablet (1,000 mg total) by mouth 3 (three) times daily. 02/11/19   Noemi Chapel, MD    Allergies    Patient has no known allergies.  Review of Systems   Review of Systems  Respiratory: Positive for shortness of breath.   All other systems reviewed and are negative.   Physical Exam Updated Vital Signs BP 105/62   Pulse (!) 118   Temp 98.9 F (37.2 C) (Oral)   Resp (!) 32   Ht 5\' 5"  (1.651 m)   Wt 81.6 kg   SpO2 93%   BMI 29.95 kg/m   Physical Exam Vitals and nursing note reviewed.  Constitutional:      General: She is not in acute distress.    Appearance: She is well-developed. She is not diaphoretic.  HENT:     Head: Normocephalic and atraumatic.  Cardiovascular:      Rate and Rhythm: Normal rate and regular rhythm.     Heart sounds: No murmur heard.  No friction rub. No gallop.   Pulmonary:     Effort: Pulmonary effort is normal. No respiratory distress.     Breath sounds: Examination of the right-middle field reveals wheezing. Examination of the left-middle field reveals wheezing. Wheezing present.  Abdominal:     General: Bowel sounds are normal. There is no distension.     Palpations: Abdomen is soft.     Tenderness: There is no abdominal tenderness.  Musculoskeletal:        General: Normal range of motion.     Cervical back: Normal range of motion and neck supple.     Right lower leg: No tenderness. No edema.     Left lower  leg: No tenderness. No edema.  Skin:    General: Skin is warm and dry.  Neurological:     Mental Status: She is alert and oriented to person, place, and time.     ED Results / Procedures / Treatments   Labs (all labs ordered are listed, but only abnormal results are displayed) Labs Reviewed  CBC - Abnormal; Notable for the following components:      Result Value   Hemoglobin 10.7 (*)    MCH 23.6 (*)    MCHC 28.5 (*)    RDW 18.3 (*)    All other components within normal limits  SARS CORONAVIRUS 2 BY RT PCR (HOSPITAL ORDER, [place] [facility])  BASIC METABOLIC PANEL  TROPONIN I (HIGH SENSITIVITY)    EKG     Radiology DG Chest Port 1 View  Result Date: 10/24/2019 CLINICAL DATA:  Shortness of breath EXAM: PORTABLE CHEST 1 VIEW COMPARISON:  02/04/2018 FINDINGS: Heart is normal size. Patchy opacity at the left lung base. Right lung clear. No effusions. No acute bony abnormality. IMPRESSION: Patchy density at the left lung base could reflect atelectasis or infiltrate. Electronically Signed   By: Rolm Baptise [name]   On: 10/24/2019 02:02    Procedures Procedures (including critical care time)  Medications Ordered in ED Medications  albuterol (VENTOLIN HFA) 108 (90 Base) MCG/ACT inhaler 6  puff (6 puffs Inhalation Given 10/24/19 0201)  methylPREDNISolone sodium succinate (SOLU-MEDROL) 125 mg/2 mL injection 125 mg (125 mg Intravenous Given 10/24/19 0201)    ED Course  I have reviewed the triage vital signs and the nursing notes.  Pertinent labs & imaging results that were available during my care of the patient were reviewed by me and considered in my medical decision making (see chart for details).    MDM Rules/Calculators/A&P  Patient presenting here with complaints of shortness of breath and wheezing.  She has a history of COPD and this appears to be an exacerbation of this.  She is afebrile with no leukocytosis.  Covid test is negative.  Laboratory studies essentially unremarkable and chest x-ray showing no definitive infiltrate.  Patient given Solu-Medrol along with albuterol both by MDI and an hour-long treatment, however continues to have audible wheezes.  I feel as though patient will require admission for repeat steroids and nebulizer treatments I have spoken with Dr. Josephine Cables who agrees to admit.  CRITICAL CARE Performed by: Veryl Speak Total critical care time: 45 minutes Critical care time was exclusive of separately billable procedures and treating other patients. Critical care was necessary to treat or prevent imminent or life-threatening deterioration. Critical care was time spent personally by me on the following activities: development of treatment plan with patient and/or surrogate as well as nursing, discussions with consultants, evaluation of patient's response to treatment, examination of patient, obtaining history from patient or surrogate, ordering and performing treatments and interventions, ordering and review of laboratory studies, ordering and review of radiographic studies, pulse oximetry and re-evaluation of patient's condition.   Final Clinical Impression(s) / ED Diagnoses Final diagnoses:  None    Rx / DC Orders ED Discharge Orders    None         Veryl Speak, MD 10/24/19 [phone]

## 2019-10-24 NOTE — H&P (Addendum)
History and Physical  [name] ZOX:[id number] DOB: 1975/08/25 DOA: 10/24/2019  Referring physician: Veryl Speak, MD PCP: Alvester Chou, NP  Patient coming from: Home  Chief Complaint: Shortness of breath  HPI: [name] is a 44 y.o. female with medical history significant for COPD on supplemental oxygen at 2 LPM as needed at home and hypothyroidism who presents to the emergency department due to sudden onset of shortness of breath that woke her up from sleep.  Patient states that she has been having some shortness of breath since the climate started to change, however, the shortness of breath was tolerable.  She complained of feeling of chest tightness which was of moderate intensity, she used her nebulizer treatment at home without relief, so she decided to go to the ED for further evaluation and management.  Patient states that she was once intubated due to COPD exacerbation.  She was exposed to her son who had Covid about 3 weeks ago.  She denies fever, headache, nausea, vomiting, abdominal pain.  ED Course:   In the emergency department, she was tachycardic, tachypneic.  Work-up in the ED showed normocytic anemia and normal BMP except for mild hyperglycemia.  SARS coronavirus 2 was negative, troponin I < 2, magnesium 1.9, phosphorus 3.8.  Chest x-ray showed patchy density at the left lung base which could reflect atelectasis or infiltrate.  Breathing treatment and IV Solu-Medrol 125 Mg x1 was given, patient was still wheezing, so it was decided for patient to be admitted and hospitalist was asked to admit patient for further management.  Review of Systems: Constitutional: Negative for chills and fever.  HENT: Negative for ear pain and sore throat.   Eyes: Negative for pain and visual disturbance.  Respiratory: Positive for cough with occasional yellowish phlegm and shortness of breath.   Cardiovascular: Negative for chest pain and palpitations.  Gastrointestinal:  Negative for abdominal pain and vomiting.  Endocrine: Negative for polyphagia and polyuria.  Genitourinary: Negative for decreased urine volume, dysuria Musculoskeletal: Negative for arthralgias and back pain.  Skin: Negative for color change and rash.  Allergic/Immunologic: Negative for immunocompromised state.  Neurological: Negative for tremors, syncope, speech difficulty, weakness, light-headedness and headaches.  Hematological: Does not bruise/bleed easily.  All other systems reviewed and are negative   Past Medical History:  Diagnosis Date  . Alcohol abuse   . Anemia 01/31/2011  . Asthma   . COPD (chronic obstructive pulmonary disease) ([place])   . Depression   . Hypoxia 02/01/2011  . Needs sleep apnea assessment 2013.07.31  . Tobacco abuse    Past Surgical History:  Procedure Laterality Date  . ANKLE GANGLION CYST EXCISION    . BACK SURGERY    . CESAREAN SECTION    . COLONOSCOPY WITH PROPOFOL N/A 06/27/2016   Procedure: COLONOSCOPY WITH PROPOFOL;  Surgeon: Jonathon Bellows, MD;  Location: [facility] ENDOSCOPY;  Service: Endoscopy;  Laterality: N/A;  . DILATION AND CURETTAGE OF UTERUS    . ESOPHAGOGASTRODUODENOSCOPY (EGD) WITH PROPOFOL N/A 06/27/2016   Procedure: ESOPHAGOGASTRODUODENOSCOPY (EGD) WITH PROPOFOL;  Surgeon: Jonathon Bellows, MD;  Location: [facility] ENDOSCOPY;  Service: Endoscopy;  Laterality: N/A;    Social History:  reports that she has been smoking cigarettes. She has a 20.00 pack-year smoking history. She has never used smokeless tobacco. She reports current alcohol use. She reports that she does not use drugs.   No Known Allergies  Family History  Problem Relation Age of Onset  . Hypothyroidism Mother   . Coronary artery disease Mother   .  Throat cancer Mother   . Diabetes Maternal Grandmother      Prior to Admission medications   Medication Sig Start Date End Date Taking? Authorizing Provider  albuterol (PROVENTIL) (2.5 MG/3ML) 0.083% nebulizer solution Take 3 mLs (2.5  mg total) by nebulization every 6 (six) hours as needed for wheezing or shortness of breath. For asthma 12/07/16   Raiford Noble Latif, DO  albuterol (VENTOLIN HFA) 108 (90 Base) MCG/ACT inhaler Inhale 1-2 puffs into the lungs every 6 (six) hours as needed for wheezing or shortness of breath. 02/11/19   Noemi Chapel, MD  budesonide-formoterol [facility]) 160-4.5 MCG/ACT inhaler Inhale 2 puffs into the lungs 2 (two) times daily. 03/06/18   Flora Lipps, MD  diazepam (VALIUM) 5 MG tablet Take 1 tablet (5 mg total) by mouth every 8 (eight) hours as needed for muscle spasms. 09/19/19   Long, Wonda Olds, MD  FLUoxetine (PROZAC) 40 MG capsule Take 40 mg by mouth daily. 08/05/16   [provider]  Fluticasone-Salmeterol (ADVAIR) 500-50 MCG/DOSE AEPB Inhale 2 puffs into the lungs 2 (two) times daily.    [provider]  ipratropium (ATROVENT) 0.02 % nebulizer solution Inhale 0.5 mg into the lungs 4 (four) times daily as needed.     [provider]  levothyroxine (SYNTHROID, LEVOTHROID) 137 MCG tablet Take 137 mcg by mouth daily. 11/22/16   [provider]  lidocaine (XYLOCAINE) 5 % ointment Apply 1 application topically 3 (three) times daily as needed. 09/19/19   Long, Wonda Olds, MD  montelukast (SINGULAIR) 10 MG tablet Take 10 mg by mouth at bedtime.    [provider]  sucralfate (CARAFATE) 1 g tablet Take 1 tablet by mouth 4 (four) times daily as needed. 03/19/17   [provider]  valACYclovir (VALTREX) 1000 MG tablet Take 1 tablet (1,000 mg total) by mouth 3 (three) times daily. 02/11/19   Noemi Chapel, MD    Physical Exam: BP 111/69   Pulse (!) 117   Temp 98.9 F (37.2 C) (Oral)   Resp (!) 25   Ht 5\' 5"  (1.651 m)   Wt 81.6 kg   SpO2 92%   BMI 29.95 kg/m   . General: 44 y.o. year-old female well developed well nourished in no acute distress.  Alert and oriented x3. Marland Kitchen HEENT: NCAT, EOMI . Neck: Supple, trachea medial . Cardiovascular: Regular rate  and rhythm with no rubs or gallops.  No thyromegaly or JVD noted.  No lower extremity edema. 2/4 pulses in all 4 extremities. Marland Kitchen Respiratory: Diffuse wheezing without crackles on auscultation.   . Abdomen: Soft nontender nondistended with normal bowel sounds x4 quadrants. . Muskuloskeletal: No cyanosis, clubbing or edema noted bilaterally . Neuro: CN II-XII intact, strength, sensation, reflexes . Skin: No ulcerative lesions noted or rashes . Psychiatry: Judgement and insight appear normal. Mood is appropriate for condition and setting          Labs on Admission:  Basic Metabolic Panel: Recent Labs  Lab 10/24/19 0127 10/24/19 0342  NA 138  --   K 3.7  --   CL 106  --   CO2 24  --   GLUCOSE 111*  --   BUN 17  --   CREATININE 0.80  --   CALCIUM 8.9  --   MG  --  1.9  PHOS  --  3.8   Liver Function Tests: No results for input(s): AST, ALT, ALKPHOS, BILITOT, PROT, ALBUMIN in the last 168 hours. No results for input(s): LIPASE,  AMYLASE in the last 168 hours. No results for input(s): AMMONIA in the last 168 hours. CBC: Recent Labs  Lab 10/24/19 0127  WBC 9.9  HGB 10.7*  HCT 37.5  MCV 82.6  PLT 342   Cardiac Enzymes: No results for input(s): CKTOTAL, CKMB, CKMBINDEX, TROPONINI in the last 168 hours.  BNP (last 3 results) No results for input(s): BNP in the last 8760 hours.  ProBNP (last 3 results) No results for input(s): PROBNP in the last 8760 hours.  CBG: No results for input(s): GLUCAP in the last 168 hours.  Radiological Exams on Admission: DG Chest Port 1 View  Result Date: 10/24/2019 CLINICAL DATA:  Shortness of breath EXAM: PORTABLE CHEST 1 VIEW COMPARISON:  02/04/2018 FINDINGS: Heart is normal size. Patchy opacity at the left lung base. Right lung clear. No effusions. No acute bony abnormality. IMPRESSION: Patchy density at the left lung base could reflect atelectasis or infiltrate. Electronically Signed   By: Rolm Baptise [name]   On: 10/24/2019 02:02    EKG:  I independently viewed the EKG done and my findings are as followed: EKG was not done in the ED  Assessment/Plan Present on Admission: . COPD with acute exacerbation ([place]) . Hypothyroidism . Acute on chronic respiratory failure (HCC)  Principal Problem:   COPD with acute exacerbation (HCC) Active Problems:   Hypothyroidism   Acute on chronic respiratory failure (HCC)  Acute on chronic respiratory failure secondary to acute exacerbation of COPD Continue duo nebs, Mucinex, Solu-Medrol, azithromycin. Continue Protonix to prevent steroid-induced ulcer Continue incentive spirometry and flutter valve Continue supplemental oxygen to maintain O2 sat > 92% with plan to wean patient off oxygen as tolerated  Hypothyroidism Continue home levothyroxine  DVT prophylaxis: Lovenox  Code Status: Full code  Family Communication: None at bedside  Disposition Plan:  Patient is from:                        home Anticipated DC to:                   home Anticipated DC date:              1 day Anticipated DC barriers:        Patient unstable to discharge at this time due to worsening shortness of breath requiring supplemental oxygen at this time and due to COPD exacerbation requiring inpatient management and close monitoring.     Consults called: None  Admission status: Observation    Bernadette Hoit MD [facility]  If 7PM-7AM, please contact night-coverage [email or web address] Password [facility]  10/24/2019, 5:57 AM

## 2019-10-24 NOTE — ED Triage Notes (Addendum)
Pt states she began having shortness of breath tonight, pt wheezing in triage.  Pt states her son had COVID about 3 weeks ago.

## 2019-10-24 NOTE — Progress Notes (Signed)
PROGRESS NOTE  [name]  DOB: [birth year]  PCP: Alvester Chou, NP PFX:[id number]  DOA: 10/24/2019  LOS: 0 days   Chief Complaint  Patient presents with  . Shortness of Breath    Brief narrative: [name] is a 44 y.o. female with COPD on as needed oxygen at home, history of intubation, asthma, depression, chronic anemia, alcohol abuse. Patient presented to the ED on 10/24/2019 after she woke up with sudden onset shortness of breath, wheezing. She was at her mother's house taking care of her and did not have oxygen available at the time.  Symptoms got worse, did not resolve with nebulizer and hence presented to the ED for further evaluation. Has been vaccinated for COVID  In the ED, patient was tachycardic, tachypneic.  She had generalized wheezing. COVID-19 PCR negative Chest x-ray showed patchy density at the left lung base which could reflect atelectasis or infiltrate Patient was given a dose of Solu-Medrol 125 mg IV and admitted to hospitalist service for further evaluation management  Subjective: Patient was seen and examined this morning. Pleasant young Caucasian female. Lying on bed.  On 2 L oxygen by nasal cannula.  Generalized wheezing remains.  Coughs on deep breathing.  Assessment/Plan: Acute on chronic respiratory failure  COPD exacerbation  Left lung base pneumonia  -On admission was she has been started on azithromycin 250 mg daily, IV Solu-Medrol, DuoNeb, Protonix, incentive spirometry, flutter valve.   -I would upgrade the antibiotics to IV Rocephin and IV azithromycin.   -Continue IV Solu-Medrol currently at 40 mg twice daily.   -Continue other supportive care.   -On as needed supplemental oxygen at home.  Currently on 2 L.  Wean down as tolerated.  Hypotension -On initial blood pressure reading was 75/63 which I doubt was accurate. -Blood pressure readings after that admitted mostly in 100s.  SIRS -Patient met SIRS criteria on admission  with tachycardia and tachypnea.  However her tachycardia and tachypnea I believe were secondary to hypoxia.  I do not think she was septic at presentation.  Sepsis ruled out  Hypothyroidism Continue home levothyroxine  Mobility: Encourage ambulation Code Status:   Code Status: Full Code  Nutritional status: Body mass index is 29.95 kg/m.     Diet Order            Diet regular Room service appropriate? Yes; Fluid consistency: Thin  Diet effective now                 DVT prophylaxis: enoxaparin (LOVENOX) injection 40 mg Start: 10/24/19 1000 SCDs Start: 10/24/19 0507   Antimicrobials:  IV Rocephin, IV azithromycin Fluid: None Consultants: None Family Communication:  None at bedside  Status is: Observation  The patient will require care spanning > 2 midnights and should be moved to inpatient because: Ongoing diagnostic testing needed not appropriate for outpatient work up and IV treatments appropriate due to intensity of illness or inability to take PO  Dispo: The patient is from: Home              Anticipated d/c is to: Home              Anticipated d/c date is: 2 days              Patient currently is not medically stable to d/c.       Infusions:  . azithromycin    . cefTRIAXone (ROCEPHIN)  IV      Scheduled Meds: . dextromethorphan-guaiFENesin  1 tablet  Oral BID  . enoxaparin (LOVENOX) injection  40 mg Subcutaneous Q24H  . guaiFENesin  600 mg Oral BID  . ipratropium-albuterol  3 mL Nebulization Q6H  . levothyroxine  137 mcg Oral Daily  . methylPREDNISolone (SOLU-MEDROL) injection  40 mg Intravenous Q12H  . pantoprazole  40 mg Oral Daily    Antimicrobials: Anti-infectives (From admission, onward)   Start     Dose/Rate Route Frequency Ordered Stop   10/25/19 1000  azithromycin (ZITHROMAX) tablet 250 mg  Status:  Discontinued       "Followed by" Linked Group Details   250 mg Oral Daily 10/24/19 0600 10/24/19 1004   10/24/19 1015  cefTRIAXone (ROCEPHIN) 1 g  in sodium chloride 0.9 % 100 mL IVPB        1 g 200 mL/hr over 30 Minutes Intravenous Every 24 hours 10/24/19 1004     10/24/19 1015  azithromycin (ZITHROMAX) 500 mg in sodium chloride 0.9 % 250 mL IVPB        500 mg 250 mL/hr over 60 Minutes Intravenous Every 24 hours 10/24/19 1004     10/24/19 1000  azithromycin (ZITHROMAX) tablet 500 mg       "Followed by" Linked Group Details   500 mg Oral Daily 10/24/19 0600 10/24/19 0954      PRN meds: albuterol, ipratropium-albuterol   Objective: Vitals:   10/24/19 0830 10/24/19 0922  BP: (!) 90/59   Pulse: 100   Resp: (!) 23   Temp:    SpO2: 92% 94%   No intake or output data in the 24 hours ending 10/24/19 1004 Filed Weights   10/24/19 0114  Weight: 81.6 kg   Weight change:  Body mass index is 29.95 kg/m.   Physical Exam: General exam: Appears calm and comfortable.  Not in physical distress Skin: No rashes, lesions or ulcers. HEENT: Atraumatic, normocephalic, supple neck, no obvious bleeding Lungs: Mild to moderate diffuse bilateral wheezing and bronchial sounds CVS: Regular rate and rhythm, no murmur GI/Abd soft, nontender, nondistended, bowel sound present CNS: Alert, awake, oriented x3 Psychiatry: Mood appropriate Extremities: No pedal edema, no calf tenderness  Data Review: I have personally reviewed the laboratory data and studies available.  Recent Labs  Lab 10/24/19 0127  WBC 9.9  HGB 10.7*  HCT 37.5  MCV 82.6  PLT 342   Recent Labs  Lab 10/24/19 0127 10/24/19 0342  NA 138  --   K 3.7  --   CL 106  --   CO2 24  --   GLUCOSE 111*  --   BUN 17  --   CREATININE 0.80  --   CALCIUM 8.9  --   MG  --  1.9  PHOS  --  3.8    F/u labs ordered  Signed, Terrilee Croak, MD [facility] 10/24/2019

## 2019-10-24 NOTE — Evaluation (Addendum)
Physical Therapy Evaluation Patient Details Name: [name] MRN: [id number] DOB: [birth year] Today's Date: 10/24/2019   History of Present Illness  [name] is a 44 y.o. female with medical history significant for COPD on supplemental oxygen at 2 LPM as needed at home and hypothyroidism who presents to the emergency department due to sudden onset of shortness of breath that woke her up from sleep.  Patient states that she has been having some shortness of breath since the climate started to change, however, the shortness of breath was tolerable.  She complained of feeling of chest tightness which was of moderate intensity, she used her nebulizer treatment at home without relief, so she decided to go to the ED for further evaluation and management.  Patient states that she was once intubated due to COPD exacerbation.  She was exposed to her son who had Covid about 3 weeks ago.  She denies fever, headache, nausea, vomiting, abdominal pain.    Clinical Impression  The patient's O2 sat was 91% at rest on 2LPM [place]. The patient appeared mildly anxious and was informed to keep [place] on. The patient performed supine to sit transfer min guard, with O2 at 94%. The patient had SOB upon sitting which slightly resolved, but returned when patient performed sit to stand transfer min guard. She attempted to ambulate but became dizzy with LOB corrected with min assist. The patient ambulated 50 feet RW min assist with O2 dropping to 84% while on 2 LPM O2 with flexed trunk over RW. Patient was left sitting in the bed with call bell in reach after therapy- RN notified. PLAN: The patient will continue to benefit from skilled physical therapy services in hospital at recommended venue below in order to improve balance, gait, and ADL's to promote independence in functional activities.      Follow Up Recommendations SNF    Equipment Recommendations  None recommended by PT    Recommendations for Other  Services       Precautions / Restrictions Precautions Precautions: Fall Precaution Comments: monitor O2 Restrictions Weight Bearing Restrictions: No      Mobility  Bed Mobility Overal bed mobility: Needs Assistance Bed Mobility: Supine to Sit     Supine to sit: Min guard     General bed mobility comments: SOB upon sitting EOB  Transfers Overall transfer level: Needs assistance Equipment used: None Transfers: Sit to/from Stand Sit to Stand: Min guard;Min assist         General transfer comment: slight unsteadiness upon standing  Ambulation/Gait Ambulation/Gait assistance: Min assist Gait Distance (Feet): 50 Feet Assistive device: Rolling walker (2 wheeled) Gait Pattern/deviations: Step-through pattern;Decreased stride length;Trunk flexed Gait velocity: decreased w slow labored movement   General Gait Details: heavy reliance on RW for balance  Stairs            Wheelchair Mobility    Modified Rankin (Stroke Patients Only)       Balance Overall balance assessment: Needs assistance Sitting-balance support: Bilateral upper extremity supported;Feet unsupported Sitting balance-Leahy Scale: Fair Sitting balance - Comments: SOB and forward flexed trunk with frequent productive cough   Standing balance support: Bilateral upper extremity supported;During functional activity Standing balance-Leahy Scale: Fair Standing balance comment: reliance on RW w reports of dizziness w ambulation                             Pertinent Vitals/Pain Pain Assessment: 0-10 Pain Score: 5  Pain Location:  chest Pain Descriptors / Indicators: Grimacing;Sharp;Sore;Tightness Pain Intervention(s): Limited activity within patient's tolerance;Monitored during session;Relaxation    Home Living Family/patient expects to be discharged to:: Private residence Living Arrangements: Parent Available Help at Discharge: Family;Available PRN/intermittently Type of Home: Mobile  home Home Access: Stairs to enter Entrance Stairs-Rails: Right Entrance Stairs-Number of Steps: 5 Home Layout: One level Home Equipment: None      Prior Function Level of Independence: Independent         Comments: driving, shopping, ADL's all independent     Hand Dominance   Dominant Hand: Right    Extremity/Trunk Assessment   Upper Extremity Assessment Upper Extremity Assessment: Overall WFL for tasks assessed    Lower Extremity Assessment Lower Extremity Assessment: Generalized weakness    Cervical / Trunk Assessment Cervical / Trunk Assessment: Normal  Communication   Communication: No difficulties  Cognition Arousal/Alertness: Awake/alert Behavior During Therapy: WFL for tasks assessed/performed Overall Cognitive Status: Within Functional Limits for tasks assessed                                        General Comments      Exercises     Assessment/Plan    PT Assessment Patient needs continued PT services  PT Problem List Decreased strength;Decreased range of motion;Decreased activity tolerance;Decreased balance;Decreased mobility;Decreased knowledge of use of DME;Decreased safety awareness;Cardiopulmonary status limiting activity;Pain       PT Treatment Interventions DME instruction;Gait training;Functional mobility training;Therapeutic activities;Therapeutic exercise;Balance training;Patient/family education    PT Goals (Current goals can be found in the Care Plan section)  Acute Rehab PT Goals Patient Stated Goal: get breathing and cough under control PT Goal Formulation: With patient Time For Goal Achievement: 11/07/19 Potential to Achieve Goals: Good    Frequency Min 3X/week   Barriers to discharge        Co-evaluation               AM-PAC PT "6 Clicks" Mobility  Outcome Measure Help needed turning from your back to your side while in a flat bed without using bedrails?: None Help needed moving from lying on your  back to sitting on the side of a flat bed without using bedrails?: None Help needed moving to and from a bed to a chair (including a wheelchair)?: A Little Help needed standing up from a chair using your arms (e.g., wheelchair or bedside chair)?: None Help needed to walk in hospital room?: A Little Help needed climbing 3-5 steps with a railing? : A Lot 6 Click Score: 20    End of Session Equipment Utilized During Treatment: Gait belt;Oxygen Activity Tolerance: Patient limited by fatigue;Patient limited by pain Patient left: in bed;with call bell/phone within reach Nurse Communication: Mobility status PT Visit Diagnosis: Unsteadiness on feet (R26.81);Other abnormalities of gait and mobility (R26.89);Muscle weakness (generalized) (M62.81);Pain Pain - Right/Left:  (chest) Pain - part of body:  (chest)    Time: 0539-7673 PT Time Calculation (min) (ACUTE ONLY): 25 min   Charges:   PT Evaluation $PT Eval Moderate Complexity: 1 Mod PT Treatments $Therapeutic Activity: 23-37 mins        9:56 AM , 10/24/19 Karlyn Agee, SPT Physical Therapy with [facility] [phone] office   During this treatment session, the therapist was present, participating in and directing the treatment.  9:56 AM, 10/24/19 Lonell Grandchild, MPT Physical Therapist with [facility]  [phone] office [phone] mobile phone

## 2019-10-25 DIAGNOSIS — J441 Chronic obstructive pulmonary disease with (acute) exacerbation: Secondary | ICD-10-CM | POA: Diagnosis not present

## 2019-10-25 LAB — COMPREHENSIVE METABOLIC PANEL
ALT: 21 U/L (ref 0–44)
AST: 16 U/L (ref 15–41)
Albumin: 3.6 g/dL (ref 3.5–5.0)
Alkaline Phosphatase: 56 U/L (ref 38–126)
Anion gap: 9 (ref 5–15)
BUN: 18 mg/dL (ref 6–20)
CO2: 24 mmol/L (ref 22–32)
Calcium: 9.2 mg/dL (ref 8.9–10.3)
Chloride: 105 mmol/L (ref 98–111)
Creatinine, Ser: 0.58 mg/dL (ref 0.44–1.00)
GFR calc Af Amer: >60 mL/min (ref >60)
GFR calc non Af Amer: >60 mL/min (ref >60)
Glucose, Bld: 103 mg/dL — ABNORMAL HIGH (ref 70–99)
Potassium: 4.5 mmol/L (ref 3.5–5.1)
Sodium: 138 mmol/L (ref 135–145)
Total Bilirubin: 0.5 mg/dL (ref 0.3–1.2)
Total Protein: 7.1 g/dL (ref 6.5–8.1)

## 2019-10-25 LAB — CBC WITH DIFFERENTIAL/PLATELET
Abs Immature Granulocytes: 0.04 10*3/uL (ref 0.00–0.07)
Basophils Absolute: 0 10*3/uL (ref 0.0–0.1)
Basophils Relative: 0 %
Eosinophils Absolute: 0 10*3/uL (ref 0.0–0.5)
Eosinophils Relative: 0 %
HCT: 35.3 % — ABNORMAL LOW (ref 36.0–46.0)
Hemoglobin: 10.2 g/dL — ABNORMAL LOW (ref 12.0–15.0)
Immature Granulocytes: 0 %
Lymphocytes Relative: 15 %
Lymphs Abs: 1.4 10*3/uL (ref 0.7–4.0)
MCH: 23.9 pg — ABNORMAL LOW (ref 26.0–34.0)
MCHC: 28.9 g/dL — ABNORMAL LOW (ref 30.0–36.0)
MCV: 82.7 fL (ref 80.0–100.0)
Monocytes Absolute: 0.6 10*3/uL (ref 0.1–1.0)
Monocytes Relative: 6 %
Neutro Abs: 7.4 10*3/uL (ref 1.7–7.7)
Neutrophils Relative %: 79 %
Platelets: 319 10*3/uL (ref 150–400)
RBC: 4.27 MIL/uL (ref 3.87–5.11)
RDW: 18.6 % — ABNORMAL HIGH (ref 11.5–15.5)
WBC: 9.4 10*3/uL (ref 4.0–10.5)
nRBC: 0 % (ref 0.0–0.2)

## 2019-10-25 LAB — PROTIME-INR
INR: 1 (ref 0.8–1.2)
Prothrombin Time: 13.2 seconds (ref 11.4–15.2)

## 2019-10-25 LAB — NOVEL CORONAVIRUS, NAA: SARS-CoV-2, NAA: NOT DETECTED

## 2019-10-25 LAB — APTT: aPTT: 25 seconds (ref 24–36)

## 2019-10-25 MED ORDER — DM-GUAIFENESIN ER 30-600 MG PO TB12: 1.0000 | ORAL_TABLET | Freq: Two times a day (BID) | ORAL | 0 refills | Status: AC

## 2019-10-25 MED ORDER — CEFDINIR 300 MG PO CAPS: 300.0000 mg | ORAL_CAPSULE | Freq: Two times a day (BID) | ORAL | 0 refills | Status: AC

## 2019-10-25 MED ORDER — PREDNISONE 10 MG PO TABS: ORAL_TABLET | ORAL | 0 refills | Status: DC

## 2019-10-25 MED ORDER — SODIUM CHLORIDE 0.9 % IV SOLN
INTRAVENOUS | Status: DC | PRN
  Administered 2019-10-25: 500 mL via INTRAVENOUS

## 2019-10-25 NOTE — Progress Notes (Signed)
SATURATION QUALIFICATIONS: (This note is used to comply with regulatory documentation for home oxygen)  Patient Saturations on Room Air at Rest = 90%  Patient Saturations on Room Air while Ambulating = 87%  Patient Saturations on 3 Liters of oxygen while Ambulating = 93%  Please briefly explain why patient needs home oxygen:[place]ats on room air

## 2019-10-25 NOTE — Progress Notes (Signed)
Physical Therapy Treatment Patient Details Name: [name] MRN: [id number] DOB: [birth year] Today's Date: 10/25/2019    History of Present Illness [name] is a 44 y.o. female with medical history significant for COPD on supplemental oxygen at 2 LPM as needed at home and hypothyroidism who presents to the emergency department due to sudden onset of shortness of breath that woke her up from sleep.  Patient states that she has been having some shortness of breath since the climate started to change, however, the shortness of breath was tolerable.  She complained of feeling of chest tightness which was of moderate intensity, she used her nebulizer treatment at home without relief, so she decided to go to the ED for further evaluation and management.  Patient states that she was once intubated due to COPD exacerbation.  She was exposed to her son who had Covid about 3 weeks ago.  She denies fever, headache, nausea, vomiting, abdominal pain.    PT Comments    Patient demonstrates good return for sitting up at bedside and able to ambulate in room/hallway without loss of balance without use of an AD.  Patient on 3 LPM with SpO2 at 93% during ambulation - MD notified.  Patient will benefit from continued physical therapy in hospital to increase strength, balance, endurance for safe ADLs and gait.    Follow Up Recommendations  No PT follow up;Supervision - Intermittent     Equipment Recommendations  None recommended by PT    Recommendations for Other Services       Precautions / Restrictions Precautions Precautions: None Precaution Comments: monitor O2 Restrictions Weight Bearing Restrictions: No    Mobility  Bed Mobility Overal bed mobility: Independent                Transfers Overall transfer level: Modified independent                  Ambulation/Gait Ambulation/Gait assistance: Modified independent (Device/Increase time) Gait Distance (Feet): 100  Feet Assistive device: None Gait Pattern/deviations: WFL(Within Functional Limits) Gait velocity: slightly decreased   General Gait Details: Patient demonstrates good return for ambulation without use of AD without loss of balance while on 3 LPM with SpO2 at 93%   Stairs             Wheelchair Mobility    Modified Rankin (Stroke Patients Only)       Balance Overall balance assessment: No apparent balance deficits (not formally assessed)                                          Cognition Arousal/Alertness: Awake/alert Behavior During Therapy: WFL for tasks assessed/performed                                          Exercises      General Comments        Pertinent Vitals/Pain Pain Assessment: No/denies pain    Home Living                      Prior Function            PT Goals (current goals can now be found in the care plan section) Acute Rehab PT Goals PT Goal Formulation: With patient Time  For Goal Achievement: 10/27/19 Potential to Achieve Goals: Good Progress towards PT goals: Progressing toward goals    Frequency           PT Plan Discharge plan needs to be updated    Co-evaluation              AM-PAC PT "6 Clicks" Mobility   Outcome Measure  Help needed turning from your back to your side while in a flat bed without using bedrails?: None Help needed moving from lying on your back to sitting on the side of a flat bed without using bedrails?: None Help needed moving to and from a bed to a chair (including a wheelchair)?: None Help needed standing up from a chair using your arms (e.g., wheelchair or bedside chair)?: None Help needed to walk in hospital room?: None Help needed climbing 3-5 steps with a railing? : A Little 6 Click Score: 23    End of Session Equipment Utilized During Treatment: Oxygen Activity Tolerance: Patient tolerated treatment well Patient left: in bed;with call  bell/phone within reach Nurse Communication: Mobility status PT Visit Diagnosis: Unsteadiness on feet (R26.81);Other abnormalities of gait and mobility (R26.89);Muscle weakness (generalized) (M62.81)     Time: 1250-1305 PT Time Calculation (min) (ACUTE ONLY): 15 min  Charges:  $Therapeutic Activity: 8-22 mins                     1:45 PM, 10/25/19 Lonell Grandchild, MPT Physical Therapist with [facility] 336 [phone] office [phone] mobile phone

## 2019-10-25 NOTE — Progress Notes (Signed)
Discharge instructions reviewed with patient. Patient given opportunity to ask questions/voice concerns. Patient awaiting delivery of portable O2 tank prior to leaving.

## 2019-10-25 NOTE — Discharge Summary (Signed)
 Physician Discharge Summary  Brent A Weyenberg MRN:[id number] DOB: 09/11/1975 DOA: 10/24/2019  PCP: Barr, Julie, NP  Admit date: 10/24/2019 Discharge date: 10/25/2019  Admitted From: Home Discharge disposition: Home with home oxygen   Code Status: Full Code  Diet Recommendation: Regular diet  Discharge Diagnosis:   Principal Problem:   COPD with acute exacerbation (HCC) Active Problems:   Hypothyroidism   Acute on chronic respiratory failure (HCC)  History of Present Illness / Brief narrative:  [name] is a 44 y.o. female with COPD on as needed oxygen at home, history of intubation, asthma, depression, chronic anemia, alcohol abuse. Patient presented to the ED on 10/24/2019 after she woke up with sudden onset shortness of breath, wheezing. She was at her mother's house taking care of her and did not have oxygen available at the time.  Symptoms got worse, did not resolve with nebulizer and hence presented to the ED for further evaluation. Has been vaccinated for COVID  In the ED, patient was tachycardic, tachypneic.  She had generalized wheezing. COVID-19 PCR negative Chest x-ray showed patchy density at the left lung base which could reflect atelectasis or infiltrate Patient was given a dose of Solu-Medrol 125 mg IV and admitted to hospitalist service for further evaluation management  Subjective:  Seen and examined this morning.  Feels better today.  No wheezing.  Remains on 2 to 3 L oxygen.  Wants to go home.  Hospital Course:  Acute on chronic respiratory failure  COPD exacerbation  Left lung base pneumonia  -She was started on IV Rocephin, oral azithromycin, IV Solu-Medrol, nebulizers and incentive spirometer. -Feels much better today.  Wants to go home. -We will discharge her on 3 more days of oral Omnicef and a tapering course of prednisone with Protonix. -Continue nebulizers at home. -Remains on 2 to 3 L oxygen by nasal cannula. -Patient does not have  a portable oxygen at home.  Arranged through case management.  Hypotension -On initial blood pressure reading was 75/63 which I doubt was accurate. -Blood pressure readings have been consistently normal after that.  SIRS -Patient met SIRS criteria on admission with tachycardia and tachypnea.  However her tachycardia and tachypnea I believe were secondary to hypoxia.  I do not think she was septic at presentation.  Sepsis ruled out  Hypothyroidism Continue home levothyroxine  Wound care:    Discharge Exam:   Vitals:   10/24/19 1441 10/24/19 1743 10/24/19 1934 10/25/19 0309  BP:  109/62    Pulse:  92    Resp:  (!) 22    Temp:  98.2 F (36.8 C)    TempSrc:  Oral    SpO2: 97% 92% 96% 93%  Weight:      Height:        Body mass index is 29.95 kg/m.  General exam: Appears calm and comfortable.  Not in distress Skin: No rashes, lesions or ulcers. HEENT: Atraumatic, normocephalic, supple neck, no obvious bleeding Lungs: No wheezing, scattered bronchial sounds CVS: Regular rate and rhythm, no murmur GI/Abd soft, nontender, nondistended, bowel sound present CNS: Alert, awake, oriented x3 Psychiatry: Mood appropriate Extremities: No pedal edema, no calf tenderness  Follow ups:   Discharge Instructions    Diet general   Complete by: As directed    Increase activity slowly   Complete by: As directed       Follow-up Information    Barr, Julie, NP Follow up.   Specialty: Nurse Practitioner Contact information: [facility]   [address] [place] [address] [phone]               Recommendations for Outpatient Follow-Up:   1. Follow-up with PCP as an outpatient  Discharge Instructions:  Follow with Primary MD Barr, Julie, NP in 7 days   Get CBC/BMP checked in next visit within 1 week by PCP or SNF MD ( we routinely change or add medications that can affect your baseline labs and fluid status, therefore we recommend that you get  the mentioned basic workup next visit with your PCP, your PCP may decide not to get them or add new tests based on their clinical decision)  On your next visit with your PCP, please Get Medicines reviewed and adjusted.  Please request your PCP  to go over all Hospital Tests and Procedure/Radiological results at the follow up, please get all Hospital records sent to your Prim MD by signing hospital release before you go home.  Activity: As tolerated with Full fall precautions use walker/cane & assistance as needed  For Heart failure patients - Check your Weight same time everyday, if you gain over 2 pounds, or you develop in leg swelling, experience more shortness of breath or chest pain, call your Primary MD immediately. Follow Cardiac Low Salt Diet and 1.5 lit/day fluid restriction.  If you have smoked or chewed Tobacco in the last 2 yrs please stop smoking, stop any regular Alcohol  and or any Recreational drug use.  If you experience worsening of your admission symptoms, develop shortness of breath, life threatening emergency, suicidal or homicidal thoughts you must seek medical attention immediately by calling 911 or calling your MD immediately  if symptoms less severe.  You Must read complete instructions/literature along with all the possible adverse reactions/side effects for all the Medicines you take and that have been prescribed to you. Take any new Medicines after you have completely understood and accpet all the possible adverse reactions/side effects.   Do not drive, operate heavy machinery, perform activities at heights, swimming or participation in water activities or provide baby sitting services if your were admitted for syncope or siezures until you have seen by Primary MD or a Neurologist and advised to do so again.  Do not drive when taking Pain medications.  Do not take more than prescribed Pain, Sleep and Anxiety Medications  Wear Seat belts while driving.   Please  note You were cared for by a hospitalist during your hospital stay. If you have any questions about your discharge medications or the care you received while you were in the hospital after you are discharged, you can call the unit and asked to speak with the hospitalist on call if the hospitalist that took care of you is not available. Once you are discharged, your primary care physician will handle any further medical issues. Please note that NO REFILLS for any discharge medications will be authorized once you are discharged, as it is imperative that you return to your primary care physician (or establish a relationship with a primary care physician if you do not have one) for your aftercare needs so that they can reassess your need for medications and monitor your lab values.    Allergies as of 10/25/2019   No Known Allergies     Medication List    TAKE these medications   albuterol (2.5 MG/3ML) 0.083% nebulizer solution Commonly known as: PROVENTIL Take 3 mLs (2.5 mg total) by nebulization every 6 (six) hours as   needed for wheezing or shortness of breath. For asthma   albuterol 108 (90 Base) MCG/ACT inhaler Commonly known as: VENTOLIN HFA Inhale 1-2 puffs into the lungs every 6 (six) hours as needed for wheezing or shortness of breath.   ALPRAZolam 0.25 MG tablet Commonly known as: XANAX Take 0.25 mg by mouth 2 (two) times daily as needed.   budesonide-formoterol 160-4.5 MCG/ACT inhaler Commonly known as: Symbicort Inhale 2 puffs into the lungs 2 (two) times daily.   cefdinir 300 MG capsule Commonly known as: OMNICEF Take 1 capsule (300 mg total) by mouth 2 (two) times daily for 3 days.   dextromethorphan-guaiFENesin 30-600 MG 12hr tablet Commonly known as: MUCINEX DM Take 1 tablet by mouth 2 (two) times daily for 7 days.   diazepam 5 MG tablet Commonly known as: VALIUM Take 1 tablet (5 mg total) by mouth every 8 (eight) hours as needed for muscle spasms.   FLUoxetine 40 MG  capsule Commonly known as: PROZAC Take 40 mg by mouth daily.   Fluticasone-Salmeterol 500-50 MCG/DOSE Aepb Commonly known as: ADVAIR Inhale 2 puffs into the lungs 2 (two) times daily.   ipratropium 0.02 % nebulizer solution Commonly known as: ATROVENT Inhale 0.5 mg into the lungs 4 (four) times daily as needed.   ipratropium-albuterol 0.5-2.5 (3) MG/3ML Soln Commonly known as: DUONEB Take 3 mLs by nebulization every 4 (four) hours as needed.   levothyroxine 137 MCG tablet Commonly known as: SYNTHROID Take 137 mcg by mouth daily.   lidocaine 5 % ointment Commonly known as: XYLOCAINE Apply 1 application topically 3 (three) times daily as needed.   meloxicam 7.5 MG tablet Commonly known as: MOBIC Take 7.5 mg by mouth 2 (two) times daily.   montelukast 10 MG tablet Commonly known as: SINGULAIR Take 10 mg by mouth at bedtime.   pantoprazole 40 MG tablet Commonly known as: PROTONIX Take 40 mg by mouth daily.   predniSONE 10 MG tablet Commonly known as: DELTASONE Take 4 tablets (40 mg) daily for 2 days, then, Take 3 tablets (30 mg) daily for 2 days, then, Take 2 tablets (20 mg) daily for 2 days, then, Take 1 tablets (10 mg) daily for 1 days, then stop. What changed:   medication strength  how much to take  how to take this  when to take this  additional instructions   tiZANidine 2 MG tablet Commonly known as: ZANAFLEX Take 2 mg by mouth every 8 (eight) hours as needed.   valACYclovir 1000 MG tablet Commonly known as: VALTREX Take 1 tablet (1,000 mg total) by mouth 3 (three) times daily.            Durable Medical Equipment  (From admission, onward)         Start     Ordered   10/25/19 1231  DME Oxygen  Once       Question Answer Comment  Length of Need Lifetime   Mode or (Route) Nasal cannula   Liters per Minute 2   Frequency Continuous (stationary and portable oxygen unit needed)   Oxygen conserving device Yes   Oxygen delivery system Gas       10/25/19 1234          Time coordinating discharge: 35 minutes  The results of significant diagnostics from this hospitalization (including imaging, microbiology, ancillary and laboratory) are listed below for reference.    Procedures and Diagnostic Studies:   DG Chest Port 1 View  Result Date: 10/24/2019 CLINICAL DATA:  Shortness of  breath EXAM: PORTABLE CHEST 1 VIEW COMPARISON:  02/04/2018 FINDINGS: Heart is normal size. Patchy opacity at the left lung base. Right lung clear. No effusions. No acute bony abnormality. IMPRESSION: Patchy density at the left lung base could reflect atelectasis or infiltrate. Electronically Signed   By: Kevin  Dover [name]   On: 10/24/2019 02:02     Labs:   Basic Metabolic Panel: Recent Labs  Lab 10/24/19 0127 10/24/19 0342 10/25/19 0739  NA 138  --  138  K 3.7  --  4.5  CL 106  --  105  CO2 24  --  24  GLUCOSE 111*  --  103*  BUN 17  --  18  CREATININE 0.80  --  0.58  CALCIUM 8.9  --  9.2  MG  --  1.9  --   PHOS  --  3.8  --    GFR Estimated Creatinine Clearance: 93.6 mL/min (by C-G formula based on SCr of 0.58 mg/dL). Liver Function Tests: Recent Labs  Lab 10/25/19 0739  AST 16  ALT 21  ALKPHOS 56  BILITOT 0.5  PROT 7.1  ALBUMIN 3.6   No results for input(s): LIPASE, AMYLASE in the last 168 hours. No results for input(s): AMMONIA in the last 168 hours. Coagulation profile Recent Labs  Lab 10/25/19 0739  INR 1.0    CBC: Recent Labs  Lab 10/24/19 0127 10/25/19 0739  WBC 9.9 9.4  NEUTROABS  --  7.4  HGB 10.7* 10.2*  HCT 37.5 35.3*  MCV 82.6 82.7  PLT 342 319   Cardiac Enzymes: No results for input(s): CKTOTAL, CKMB, CKMBINDEX, TROPONINI in the last 168 hours. BNP: Invalid input(s): POCBNP CBG: Recent Labs  Lab 10/24/19 2136  GLUCAP 113*   D-Dimer No results for input(s): DDIMER in the last 72 hours. Hgb A1c No results for input(s): HGBA1C in the last 72 hours. Lipid Profile No results for input(s): CHOL,  HDL, LDLCALC, TRIG, CHOLHDL, LDLDIRECT in the last 72 hours. Thyroid function studies No results for input(s): TSH, T4TOTAL, T3FREE, THYROIDAB in the last 72 hours.  Invalid input(s): FREET3 Anemia work up No results for input(s): VITAMINB12, FOLATE, FERRITIN, TIBC, IRON, RETICCTPCT in the last 72 hours. Microbiology Recent Results (from the past 240 hour(s))  SARS Coronavirus 2 by RT PCR (hospital order, performed in [place] hospital lab) Nasopharyngeal Nasopharyngeal Swab     Status: None   Collection Time: 10/24/19  1:28 AM   Specimen: Nasopharyngeal Swab  Result Value Ref Range Status   SARS Coronavirus 2 NEGATIVE NEGATIVE Final    Comment: (NOTE) SARS-CoV-2 target nucleic acids are NOT DETECTED.  The SARS-CoV-2 RNA is generally detectable in upper and lower respiratory specimens during the acute phase of infection. The lowest concentration of SARS-CoV-2 viral copies this assay can detect is 250 copies / mL. A negative result does not preclude SARS-CoV-2 infection and should not be used as the sole basis for treatment or other patient management decisions.  A negative result may occur with improper specimen collection / handling, submission of specimen other than nasopharyngeal swab, presence of viral mutation(s) within the areas targeted by this assay, and inadequate number of viral copies (<250 copies / mL). A negative result must be combined with clinical observations, patient history, and epidemiological information.  Fact Sheet for Patients:   https://www.fda.gov/media/136312/download  Fact Sheet for Healthcare Providers: https://www.fda.gov/media/136313/download  This test is not yet approved or  cleared by the [place] FDA and has been authorized for detection and/or   diagnosis of SARS-CoV-2 by FDA under an Emergency Use Authorization (EUA).  This EUA will remain in effect (meaning this test can be used) for the duration of the COVID-19 declaration under  Section 564(b)(1) of the Act, 21 U.S.C. section 360bbb-3(b)(1), unless the authorization is terminated or revoked sooner.  Performed at [facility], [address] [place], [place] [address]      Signed: Terrilee Croak  [facility] 10/25/2019, 12:34 PM

## 2019-10-25 NOTE — TOC Transition Note (Signed)
Transition of Care [facility]) - CM/SW Discharge Note   Patient Details  Name: [name] MRN: [id number] Date of Birth: 11/25/1975  [facility] CM/SW Contact:  Natasha Bence, LCSW Phone Number: 10/25/2019, 1:46 PM   Clinical Narrative:    CSW received consult for patient's O2. CSW contacted patient to determine who her previous proiveder for O2 had been. Patient reported that she had [employer] and she had not been able to get in contact with [employer] and had not utilized company for several years. CSW notified patient that the company had divided into two companies and that [employer] now provided the DME equipment. Patient agreeable to receive O2 from [employer]. CSW contacted Barbaraann Rondo with [employer] for referral. Barbaraann Rondo with [employer] agreeable to provide O2. CSW contacted Nurse to request qualifying note. TOC signing off.    Final next level of care: Home/Self Care Barriers to Discharge: Barriers Resolved   Patient Goals and CMS Choice        Discharge Placement                       Discharge Plan and Services                DME Arranged: Oxygen DME Agency: [employer]                  Social Determinants of Health (SDOH) Interventions     Readmission Risk Interventions No flowsheet data found.

## 2019-11-24 ENCOUNTER — Ambulatory Visit: Payer: Self-pay | Admitting: Pulmonary Disease

## 2019-11-25 ENCOUNTER — Ambulatory Visit: Payer: Self-pay | Admitting: Pulmonary Disease

## 2019-12-15 ENCOUNTER — Other Ambulatory Visit: Payer: Commercial Managed Care - PPO

## 2019-12-15 DIAGNOSIS — Z20822 Contact with and (suspected) exposure to covid-19: Secondary | ICD-10-CM

## 2019-12-16 LAB — SARS-COV-2, NAA 2 DAY TAT

## 2019-12-16 LAB — NOVEL CORONAVIRUS, NAA: SARS-CoV-2, NAA: NOT DETECTED

## 2020-01-16 ENCOUNTER — Ambulatory Visit (HOSPITAL_COMMUNITY)
Admission: RE | Admit: 2020-01-16 | Discharge: 2020-01-16 | Disposition: A | Discharge status: Home / Self Care | Payer: Commercial Managed Care - PPO | Source: Ambulatory Visit | Attending: Internal Medicine | Admitting: Internal Medicine

## 2020-01-16 ENCOUNTER — Encounter: Payer: Self-pay | Admitting: Internal Medicine

## 2020-01-16 ENCOUNTER — Other Ambulatory Visit (HOSPITAL_COMMUNITY)
Admission: RE | Admit: 2020-01-16 | Discharge: 2020-01-16 | Disposition: A | Discharge status: Home / Self Care | Payer: Commercial Managed Care - PPO | Source: Ambulatory Visit | Attending: Internal Medicine | Admitting: Internal Medicine

## 2020-01-16 ENCOUNTER — Ambulatory Visit: Payer: Commercial Managed Care - PPO | Admitting: Internal Medicine

## 2020-01-16 ENCOUNTER — Other Ambulatory Visit: Payer: Self-pay

## 2020-01-16 DIAGNOSIS — D649 Anemia, unspecified: Secondary | ICD-10-CM | POA: Diagnosis not present

## 2020-01-16 DIAGNOSIS — J449 Chronic obstructive pulmonary disease, unspecified: Secondary | ICD-10-CM | POA: Diagnosis present

## 2020-01-16 LAB — CBC WITH DIFFERENTIAL/PLATELET
Abs Immature Granulocytes: 0.01 10*3/uL (ref 0.00–0.07)
Basophils Absolute: 0 10*3/uL (ref 0.0–0.1)
Basophils Relative: 0 %
Eosinophils Absolute: 0.8 10*3/uL — ABNORMAL HIGH (ref 0.0–0.5)
Eosinophils Relative: 12 %
HCT: 32.6 % — ABNORMAL LOW (ref 36.0–46.0)
Hemoglobin: 9.5 g/dL — ABNORMAL LOW (ref 12.0–15.0)
Immature Granulocytes: 0 %
Lymphocytes Relative: 33 %
Lymphs Abs: 2.3 10*3/uL (ref 0.7–4.0)
MCH: 23.3 pg — ABNORMAL LOW (ref 26.0–34.0)
MCHC: 29.1 g/dL — ABNORMAL LOW (ref 30.0–36.0)
MCV: 80.1 fL (ref 80.0–100.0)
Monocytes Absolute: 0.4 10*3/uL (ref 0.1–1.0)
Monocytes Relative: 6 %
Neutro Abs: 3.4 10*3/uL (ref 1.7–7.7)
Neutrophils Relative %: 49 %
Platelets: 291 10*3/uL (ref 150–400)
RBC: 4.07 MIL/uL (ref 3.87–5.11)
RDW: 15.4 % (ref 11.5–15.5)
WBC: 6.9 10*3/uL (ref 4.0–10.5)
nRBC: 0 % (ref 0.0–0.2)

## 2020-01-16 MED ORDER — BREZTRI AEROSPHERE 160-9-4.8 MCG/ACT IN AERO: 2.0000 | INHALATION_SPRAY | Freq: Two times a day (BID) | RESPIRATORY_TRACT | 0 refills | Status: DC

## 2020-01-16 MED ORDER — BREZTRI AEROSPHERE 160-9-4.8 MCG/ACT IN AERO: 2.0000 | INHALATION_SPRAY | Freq: Two times a day (BID) | RESPIRATORY_TRACT | 11 refills | Status: DC

## 2020-01-16 MED ORDER — FAMOTIDINE 20 MG PO TABS: ORAL_TABLET | ORAL | 11 refills | Status: DC

## 2020-01-16 NOTE — Progress Notes (Signed)
[name], female    DOB: 10-Jun-1975  MRN: [id number]   Brief patient profile:  59 yowf MM/quit smoking  2014 with h/o asthma as child and on an inhaler daily since then  which most recently included symbicort/nebs/hfa but could not afford symbicort and without for several months then admitted   Admit date: 10/24/2019 Discharge date: 10/25/2019  Admitted From: Home Discharge disposition: Home with home oxygen   Code Status: Full Code  Diet Recommendation: Regular diet  Discharge Diagnosis:   Principal Problem:   COPD with acute exacerbation ([place]) Active Problems:   Hypothyroidism   Acute on chronic respiratory failure ([place])  History of Present Illness / Brief narrative:  Kari Nissan Purgasonis a 44 y.o.femalewith COPD on as needed oxygen at home, history of intubation, asthma, depression, chronic anemia, alcohol abuse. Patient presented to the ED on9/10/2021after she woke up with sudden onset shortness of breath,wheezing. She was at her mother's house taking care of her and did not have oxygen available at the time. Symptoms got worse, did not resolve with nebulizer and hence presented to the ED for further evaluation. Has been vaccinated for COVID  In the ED, patient was tachycardic, tachypneic. She had generalized wheezing. COVID-19 PCR negative Chest x-ray showed patchy density at the left lung base which could reflect atelectasis or infiltrate Patient was given a dose of Solu-Medrol 125 mg IV and admitted to hospitalist service for further evaluation management     Hospital Course:  Acute on chronic respiratory failure COPD exacerbation  Left lung base pneumonia -She was started on IV Rocephin, oral azithromycin, IV Solu-Medrol, nebulizers and incentive spirometer. -We will discharge her on 3 more days of oral Omnicef and a tapering course of prednisone with Protonix. -Continue nebulizers at home. -Remains on 2 to 3 L oxygen by nasal  cannula. -Patient does not have a portable oxygen at home.  Arranged through case management.    History of Present Illness  01/16/2020  Pulmonary/ 1st office eval/[employer] /  Chronic asthma on alb only  Chief Complaint  Patient presents with  . Follow-up    non productive cough in mornings and at night  Dyspnea: MMRC3 = can't walk 100 yards even at a slow pace at a flat grade s stopping due to sob   Cough: dry worse when wakes up  Sleep: nightly wakes up nightly needs neb SABA use: hfa this am 5 h prior to OV  / neb night prior to ov 02 uses prn   No obvious day to day or daytime variability or assoc excess/ purulent sputum or mucus plugs or hemoptysis or cp or chest tightness, subjective wheeze or overt sinus or hb symptoms.    . Also denies any obvious fluctuation of symptoms with weather or environmental changes or other aggravating or alleviating factors except as outlined above   No unusual exposure hx or h/o childhood pna  or knowledge of premature birth.  Current Allergies, Complete Past Medical History, Past Surgical History, Family History, and Social History were reviewed in [employer] record.  ROS  The following are not active complaints unless bolded Hoarseness, sore throat, dysphagia, dental problems, itching, sneezing,  nasal congestion or discharge of excess mucus or purulent secretions, ear ache,   fever, chills, sweats, unintended wt loss or wt gain, classically pleuritic or exertional cp,  orthopnea pnd or arm/hand swelling  or leg swelling, presyncope, palpitations, abdominal pain, anorexia, nausea, vomiting, diarrhea  or change in bowel habits or  change in bladder habits, change in stools or change in urine, dysuria, hematuria,  rash, arthralgias, visual complaints, headache, numbness, weakness or ataxia or problems with walking or coordination,  change in mood or  memory.           Past Medical History:  Diagnosis Date  . Alcohol abuse   .  Anemia 01/31/2011  . Asthma   . COPD (chronic obstructive pulmonary disease) ([place])   . Depression   . Hypoxia 02/01/2011  . Medical history non-contributory   . Needs sleep apnea assessment 2013.07.31  . Tobacco abuse     Outpatient Medications Prior to Visit  Medication Sig Dispense Refill  . albuterol (PROVENTIL) (2.5 MG/3ML) 0.083% nebulizer solution Take 3 mLs (2.5 mg total) by nebulization every 6 (six) hours as needed for wheezing or shortness of breath. For asthma 75 mL 1  . albuterol (VENTOLIN HFA) 108 (90 Base) MCG/ACT inhaler Inhale 1-2 puffs into the lungs every 6 (six) hours as needed for wheezing or shortness of breath. 6.7 g 0  . ALPRAZolam (XANAX) 0.25 MG tablet Take 0.25 mg by mouth 2 (two) times daily as needed.    Marland Kitchen FLUoxetine (PROZAC) 40 MG capsule Take 40 mg by mouth daily.    Marland Kitchen ipratropium (ATROVENT) 0.02 % nebulizer solution Inhale 0.5 mg into the lungs 4 (four) times daily as needed.     Marland Kitchen ipratropium-albuterol (DUONEB) 0.5-2.5 (3) MG/3ML SOLN Take 3 mLs by nebulization every 4 (four) hours as needed.    Marland Kitchen levothyroxine (SYNTHROID, LEVOTHROID) 137 MCG tablet Take 137 mcg by mouth daily.  0  . lidocaine (XYLOCAINE) 5 % ointment Apply 1 application topically 3 (three) times daily as needed. 35.44 g 0  . meloxicam (MOBIC) 7.5 MG tablet Take 7.5 mg by mouth 2 (two) times daily.     . montelukast (SINGULAIR) 10 MG tablet Take 10 mg by mouth at bedtime.     . pantoprazole (PROTONIX) 40 MG tablet Take 40 mg by mouth daily.    Marland Kitchen     0  . tiZANidine (ZANAFLEX) 2 MG tablet Take 2 mg by mouth every 8 (eight) hours as needed.    . valACYclovir (VALTREX) 1000 MG tablet Take 1 tablet (1,000 mg total) by mouth 3 (three) times daily. 21 tablet 0  .            Objective:     BP 116/66 (BP Location: Left Arm, Cuff Size: Normal)   Pulse 83   Temp (!) 97.1 F (36.2 C) (Other (Comment)) Comment (Src): wrist  Ht 5\' 5"  (1.651 m)   Wt 188 lb (85.3 kg)   SpO2 97% Comment: room  air  BMI 31.28 kg/m   SpO2: 97 % (room air)   amb wf marked pseudowheeze better with plm  HEENT : pt wearing mask not removed for exam due to covid - 19 concerns.   NECK :  without JVD/Nodes/TM/ nl carotid upstrokes bilaterally   LUNGS: no acc muscle use,  Min barrel  contour chest wall with bilateral  slightly decreased bs s audible wheeze and  without cough on insp or exp maneuvers and min  Hyperresonant  to  percussion bilaterally     CV:  RRR  no s3 or murmur or increase in P2, and no edema   ABD:  soft and nontender with pos end  insp Hoover's  in the supine position. No bruits or organomegaly appreciated, bowel sounds nl  MS:   Nl gait/  ext  warm without deformities, calf tenderness, cyanosis or clubbing No obvious joint restrictions   SKIN: warm and dry without lesions    NEURO:  alert, approp, nl sensorium with  no motor or cerebellar deficits apparent.         CXR PA and Lateral:   01/16/2020 :    I personally reviewed images and agree with radiology impression as follows:    No active cardiopulmonary disease.  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 GOLD stage  Quit smoking 2014/ MM  With h/o asthma as child so ? ACOS ?  - alpha one AT phenotype  03/06/2018   MM  level 143  - 01/16/2020  After extensive coaching inhaler device,  effectiveness =    90% try breztri  - Allergy profile 01/16/2020 >  Eos 0.8 /  IgE  Pending   She is way over using saba at baseline but actually has very good hfa technique and now has insurance which should cover the cost of Breztri 100% with coupon so will initiate 2bid x 4 week samples then f/u with pfts   Re saba: I spent extra time with pt today reviewing appropriate use of albuterol for prn use on exertion with the following points: 1) saba is for relief of sob that does not improve by walking a slower pace or resting but rather if the pt does not improve after trying this first. 2) If the pt  is convinced, as many are, that saba helps recover from activity faster then it's easy to tell if this is the case by re-challenging : ie stop, take the inhaler, then p 5 minutes try the exact same activity (intensity of workload) that just caused the symptoms and see if they are substantially diminished or not after saba 3) if there is an activity that reproducibly causes the symptoms, try the saba 15 min before the activity on alternate days   If in fact the saba really does help, then fine to continue to use it prn but advised may need to look closer at the maintenance regimen being used to achieve better control of airways disease with exertion.    Anemia She is borderline microcytic and I suspect fe def ? On basis of menses   >>> rec f/u PCP or we'll address at next ov        Each maintenance medication was reviewed in detail including emphasizing most importantly the difference between maintenance and prns and under what circumstances the prns are to be triggered using an action plan format where appropriate.  Total time for H and P, chart review, counseling, teaching device and generating customized AVS unique to this office visit / charting =  52 min            Christinia Gully, MD 01/16/2020

## 2020-01-16 NOTE — Patient Instructions (Addendum)
Plan A = Automatic = Always=    Breztri Take 2 puffs first thing in am and then another 2 puffs about 12 hours later.   Work on inhaler technique:  relax and gently blow all the way out then take a nice smooth deep breath back in, triggering the inhaler at same time you start breathing in.  Hold for up to 5 seconds if you can. Blow out thru nose. Rinse and gargle with water when done      Plan B = Backup (to supplement plan A, not to replace it) Only use your albuterol inhaler as a rescue medication to be used if you can't catch your breath by resting or doing a relaxed purse lip breathing pattern.  - The less you use it, the better it will work when you need it. - Ok to use the inhaler up to 2 puffs  every 4 hours if you must but call for appointment if use goes up over your usual need - Don't leave home without it !!  (think of it like the spare tire for your car)   Plan C = Crisis (instead of Plan B but only if Plan B stops working) - only use your albuterol nebulizer if you first try Plan B and it fails to help > ok to use the nebulizer up to every 4 hours but if start needing it regularly call for immediate appointment    Pantoprozole 40 mg Take 30-60 min before first meal of the day and pepcid ac 20 mg at bedtime until return    GERD (REFLUX)  is an extremely common cause of respiratory symptoms just like yours , many times with no obvious heartburn at all.    It can be treated with medication, but also with lifestyle changes including elevation of the head of your bed (ideally with 6 -8inch blocks under the headboard of your bed),  Smoking cessation, avoidance of late meals, excessive alcohol, and avoid fatty foods, chocolate, peppermint, colas, red wine, and acidic juices such as orange juice.  NO MINT OR MENTHOL PRODUCTS SO NO COUGH DROPS  USE SUGARLESS CANDY INSTEAD (Jolley ranchers or Stover's or Life Savers) or even ice chips will also do - the key is to swallow to prevent all  throat clearing. NO OIL BASED VITAMINS - use powdered substitutes.  Avoid fish oil when coughing.      Please remember to go to the lab and x-ray department at [facility]   for your tests - we will call you with the results when they are available.      Please schedule a follow up office visit in 4 weeks with PFTs on return  - needs f/u for borderline Fe def anemia

## 2020-01-17 ENCOUNTER — Encounter: Payer: Self-pay | Admitting: Internal Medicine

## 2020-01-17 NOTE — Assessment & Plan Note (Signed)
She is borderline microcytic and I suspect fe def ? On basis of menses   >>> rec f/u PCP or we'll address at next ov

## 2020-01-17 NOTE — Assessment & Plan Note (Addendum)
Quit smoking 2014/ MM  With h/o asthma as child so ? ACOS ?  - alpha one AT phenotype  03/06/2018   MM  level 143  - 01/16/2020  After extensive coaching inhaler device,  effectiveness =    90% try breztri  - Allergy profile 01/16/2020 >  Eos 0.8 /  IgE  Pending   She is way over using saba at baseline but actually has very good hfa technique and now has insurance which should cover the cost of Breztri 100% with coupon so will initiate 2bid x 4 week samples then f/u with pfts   Re saba: I spent extra time with pt today reviewing appropriate use of albuterol for prn use on exertion with the following points: 1) saba is for relief of sob that does not improve by walking a slower pace or resting but rather if the pt does not improve after trying this first. 2) If the pt is convinced, as many are, that saba helps recover from activity faster then it's easy to tell if this is the case by re-challenging : ie stop, take the inhaler, then p 5 minutes try the exact same activity (intensity of workload) that just caused the symptoms and see if they are substantially diminished or not after saba 3) if there is an activity that reproducibly causes the symptoms, try the saba 15 min before the activity on alternate days   If in fact the saba really does help, then fine to continue to use it prn but advised may need to look closer at the maintenance regimen being used to achieve better control of airways disease with exertion.           Each maintenance medication was reviewed in detail including emphasizing most importantly the difference between maintenance and prns and under what circumstances the prns are to be triggered using an action plan format where appropriate.  Total time for H and P, chart review, counseling, teaching device and generating customized AVS unique to this office visit / charting =  52 min

## 2020-01-19 LAB — IGE: IgE (Immunoglobulin E), Serum: 62 IU/mL (ref 6–495)

## 2020-01-21 ENCOUNTER — Telehealth: Payer: Self-pay | Admitting: Internal Medicine

## 2020-01-21 NOTE — Telephone Encounter (Signed)
Kari Rockers, MD sent to [name], [place] Call patient : Studies are ok x for hgb a bit low as her MCV suggesting borderline fe def - will need to have her Fe levels checked by PCP and if still having periods decide whether that might be the cause or additional testing considered    LMTCB for the pt.

## 2020-01-21 NOTE — Progress Notes (Signed)
Tried calling the pt and there was no answer- LMTCB.  

## 2020-01-22 ENCOUNTER — Other Ambulatory Visit: Payer: Self-pay

## 2020-01-22 ENCOUNTER — Ambulatory Visit
Admission: EM | Admit: 2020-01-22 | Discharge: 2020-01-22 | Disposition: A | Discharge status: Home / Self Care | Payer: Commercial Managed Care - PPO | Attending: Emergency Medicine | Admitting: Emergency Medicine

## 2020-01-22 ENCOUNTER — Encounter: Payer: Self-pay | Admitting: Emergency Medicine

## 2020-01-22 DIAGNOSIS — G8929 Other chronic pain: Secondary | ICD-10-CM | POA: Diagnosis not present

## 2020-01-22 DIAGNOSIS — M25511 Pain in right shoulder: Secondary | ICD-10-CM

## 2020-01-22 DIAGNOSIS — M25512 Pain in left shoulder: Secondary | ICD-10-CM

## 2020-01-22 MED ORDER — PREDNISONE 10 MG PO TABS: 20.0000 mg | ORAL_TABLET | Freq: Every day | ORAL | 0 refills | Status: DC

## 2020-01-22 MED ORDER — DEXAMETHASONE SODIUM PHOSPHATE 10 MG/ML IJ SOLN
10.0000 mg | Freq: Once | INTRAMUSCULAR | Status: AC
  Administered 2020-01-22: 10 mg via INTRAMUSCULAR

## 2020-01-22 MED ORDER — KETOROLAC TROMETHAMINE 60 MG/2ML IM SOLN
30.0000 mg | Freq: Once | INTRAMUSCULAR | Status: AC
  Administered 2020-01-22: 30 mg via INTRAMUSCULAR

## 2020-01-22 NOTE — Discharge Instructions (Addendum)
Continue to take meloxicam as prescribed Toradol and Decadron IM were given in office Prednisone was prescribed/take as directed Follow-up with PCP for further reevaluation Return or go to ED if you develop any new or worsening of his symptom

## 2020-01-22 NOTE — ED Triage Notes (Signed)
Chronic left shoulder pain and now some pain on the right- had injury a few months ago and recieved cortisone shots in shoulder .

## 2020-01-22 NOTE — ED Provider Notes (Signed)
[facility]   [id number] 01/22/20 Arrival Time: [id number]   Chief Complaint  Patient presents with  . Shoulder Pain     SUBJECTIVE: History from: patient.  [name] is a 44 y.o. female with history of arthritis presented to the urgent care with a complaint of acute on chronic left and right shoulder pain.  Denies any precipitating event, trauma or injury.  She localized pain to bilateral shoulders.  She describes the pain as constant and achy.  She has tried OTC medications without relief.  Her symptoms are made worse with ROM.  She denies similar symptoms in the past.  Denies chills, fever, nausea, vomiting, diarrhea   ROS: As per HPI.  All other pertinent ROS negative.      Past Medical History:  Diagnosis Date  . Alcohol abuse   . Anemia 01/31/2011  . Asthma   . COPD (chronic obstructive pulmonary disease) ([place])   . Depression   . Hypoxia 02/01/2011  . Medical history non-contributory   . Needs sleep apnea assessment 2013.07.31  . Tobacco abuse    Past Surgical History:  Procedure Laterality Date  . ANKLE GANGLION CYST EXCISION    . BACK SURGERY    . CESAREAN SECTION    . COLONOSCOPY WITH PROPOFOL N/A 06/27/2016   Procedure: COLONOSCOPY WITH PROPOFOL;  Surgeon: Jonathon Bellows, MD;  Location: [facility] ENDOSCOPY;  Service: Endoscopy;  Laterality: N/A;  . DILATION AND CURETTAGE OF UTERUS    . ESOPHAGOGASTRODUODENOSCOPY (EGD) WITH PROPOFOL N/A 06/27/2016   Procedure: ESOPHAGOGASTRODUODENOSCOPY (EGD) WITH PROPOFOL;  Surgeon: Jonathon Bellows, MD;  Location: [facility] ENDOSCOPY;  Service: Endoscopy;  Laterality: N/A;   No Known Allergies No current facility-administered medications on file prior to encounter.   Current Outpatient Medications on File Prior to Encounter  Medication Sig Dispense Refill  . albuterol (PROVENTIL) (2.5 MG/3ML) 0.083% nebulizer solution Take 3 mLs (2.5 mg total) by nebulization every 6 (six) hours as needed for wheezing or shortness of breath.  For asthma 75 mL 1  . albuterol (VENTOLIN HFA) 108 (90 Base) MCG/ACT inhaler Inhale 1-2 puffs into the lungs every 6 (six) hours as needed for wheezing or shortness of breath. 6.7 g 0  . ALPRAZolam (XANAX) 0.25 MG tablet Take 0.25 mg by mouth 2 (two) times daily as needed.    . Budeson-Glycopyrrol-Formoterol (BREZTRI AEROSPHERE) 160-9-4.8 MCG/ACT AERO Inhale 2 puffs into the lungs 2 (two) times daily. 10.7 g 11  . famotidine (PEPCID) 20 MG tablet One after supper 30 tablet 11  . FLUoxetine (PROZAC) 40 MG capsule Take 40 mg by mouth daily.    Marland Kitchen levothyroxine (SYNTHROID, LEVOTHROID) 137 MCG tablet Take 137 mcg by mouth daily.  0  . lidocaine (XYLOCAINE) 5 % ointment Apply 1 application topically 3 (three) times daily as needed. 35.44 g 0  . meloxicam (MOBIC) 7.5 MG tablet Take 7.5 mg by mouth 2 (two) times daily.     . montelukast (SINGULAIR) 10 MG tablet Take 10 mg by mouth at bedtime.     . pantoprazole (PROTONIX) 40 MG tablet Take 40 mg by mouth daily.    Marland Kitchen tiZANidine (ZANAFLEX) 2 MG tablet Take 2 mg by mouth every 8 (eight) hours as needed.    . valACYclovir (VALTREX) 1000 MG tablet Take 1 tablet (1,000 mg total) by mouth 3 (three) times daily. 21 tablet 0   Social History   Socioeconomic History  . Marital status: Single    Spouse name: Not on file  . Number of  children: Not on file  . Years of education: Not on file  . Highest education level: Not on file  Occupational History  . Not on file  Tobacco Use  . Smoking status: Former Smoker    Packs/day: 1.00    Years: 20.00    Pack years: 20.00    Types: Cigarettes    Quit date: 06/19/2012    Years since quitting: 7.5  . Smokeless tobacco: Never Used  . Tobacco comment: Quit dec. 2019.  Vaping Use  . Vaping Use: Some days  Substance and Sexual Activity  . Alcohol use: Yes  . Drug use: No    Types: "Crack" cocaine    Comment: crack-Not since September  . Sexual activity: Never    Comment: Homosexual  Other Topics Concern  .  Not on file  Social History Narrative  . Not on file   Social Determinants of Health   Financial Resource Strain: Not on file  Food Insecurity: Not on file  Transportation Needs: Not on file  Physical Activity: Not on file  Stress: Not on file  Social Connections: Not on file  Intimate Partner Violence: Not on file   Family History  Problem Relation Age of Onset  . Hypothyroidism Mother   . Coronary artery disease Mother   . Throat cancer Mother   . Diabetes Maternal Grandmother     OBJECTIVE:  Vitals:   01/22/20 1005  BP: 106/69  Pulse: 75  Resp: 15  Temp: 98.2 F (36.8 C)  SpO2: 97%     Physical Exam Vitals and nursing note reviewed.  Constitutional:      General: She is not in acute distress.    Appearance: Normal appearance. She is normal weight. She is not ill-appearing, toxic-appearing or diaphoretic.  HENT:     Head: Normocephalic.  Cardiovascular:     Rate and Rhythm: Normal rate and regular rhythm.     Pulses: Normal pulses.     Heart sounds: Normal heart sounds. No murmur heard. No friction rub. No gallop.   Pulmonary:     Effort: Pulmonary effort is normal. No respiratory distress.     Breath sounds: Normal breath sounds. No stridor. No wheezing, rhonchi or rales.  Chest:     Chest wall: No tenderness.  Musculoskeletal:        General: Tenderness present.     Right shoulder: Tenderness present.     Left shoulder: Tenderness present.     Comments: The right shoulder is without any obvious asymmetry or deformity when compared to the left shoulder.  There is no ecchymosis, open wound, lesion, swelling or warmth present.  Limited range of motion due to pain.  Neurovascular status intact.  Neurological:     Mental Status: She is alert and oriented to person, place, and time.     LABS:  No results found for this or any previous visit (from the past 24 hour(s)).   ASSESSMENT & PLAN:  1. Chronic pain of both shoulders     Meds ordered this  encounter  Medications  . predniSONE (DELTASONE) 10 MG tablet    Sig: Take 2 tablets (20 mg total) by mouth daily.    Dispense:  15 tablet    Refill:  0    Discharge instructions  Continue to take meloxicam as prescribed Toradol and Decadron IM were given in office Prednisone was prescribed/take as directed Follow-up with PCP for further reevaluation Return or go to ED if you develop any new or  worsening of his symptom  Reviewed expectations re: course of current medical issues. Questions answered. Outlined signs and symptoms indicating need for more acute intervention. Patient verbalized understanding. After Visit Summary given.         Emerson Monte, FNP 01/22/20 1102

## 2020-01-23 NOTE — Telephone Encounter (Signed)
Pt is calling in for results from lab work. Please advise

## 2020-01-23 NOTE — Telephone Encounter (Addendum)
Called and spoke with patient to let her know what Dr. Melvyn Novas said about Sputum. Patient states that she has a form that needs to be filled out by Dr. Melvyn Novas for her to get her license back. Advised patient that he will be in [place] next week and she can take it by to see if he can fill it out. She expressed understanding and states that she would bring it by that week. Nothing further needed at this time.

## 2020-01-23 NOTE — Telephone Encounter (Signed)
Called and spoke with pt letting her know the results of labwork and she verbalized understanding.  While speaking with pt, pt stated that it was mentioned to her by MW that she needed to collect a sputum culture sample. I am not seeing anything documented about this in pt's last OV. Dr. Melvyn Novas, please advise.  Instructions  Plan A = Automatic = Always=    Breztri Take 2 puffs first thing in am and then another 2 puffs about 12 hours later.   Work on inhaler technique:  relax and gently blow all the way out then take a nice smooth deep breath back in, triggering the inhaler at same time you start breathing in.  Hold for up to 5 seconds if you can. Blow out thru nose. Rinse and gargle with water when done      Plan B = Backup (to supplement plan A, not to replace it) Only use your albuterol inhaler as a rescue medication to be used if you can't catch your breath by resting or doing a relaxed purse lip breathing pattern.  - The less you use it, the better it will work when you need it. - Ok to use the inhaler up to 2 puffs  every 4 hours if you must but call for appointment if use goes up over your usual need - Don't leave home without it !!  (think of it like the spare tire for your car)   Plan C = Crisis (instead of Plan B but only if Plan B stops working) - only use your albuterol nebulizer if you first try Plan B and it fails to help > ok to use the nebulizer up to every 4 hours but if start needing it regularly call for immediate appointment    Pantoprozole 40 mg Take 30-60 min before first meal of the day and pepcid ac 20 mg at bedtime until return    GERD (REFLUX)  is an extremely common cause of respiratory symptoms just like yours , many times with no obvious heartburn at all.    It can be treated with medication, but also with lifestyle changes including elevation of the head of your bed (ideally with 6 -8inch blocks under the headboard of your bed),  Smoking cessation,  avoidance of late meals, excessive alcohol, and avoid fatty foods, chocolate, peppermint, colas, red wine, and acidic juices such as orange juice.  NO MINT OR MENTHOL PRODUCTS SO NO COUGH DROPS  USE SUGARLESS CANDY INSTEAD (Jolley ranchers or Stover's or Life Savers) or even ice chips will also do - the key is to swallow to prevent all throat clearing. NO OIL BASED VITAMINS - use powdered substitutes.  Avoid fish oil when coughing.      Please remember to go to the lab and x-ray department at [facility]   for your tests - we will call you with the results when they are available.      Please schedule a follow up office visit in 4 weeks with PFTs on return  - needs f/u for borderline Fe def anemia

## 2020-01-23 NOTE — Telephone Encounter (Signed)
Last note said she had no cough and cxr was normal so I think we miscommunicated - no need for a sputum specimen based on the symptoms or findings from last ov - if has a new problem then we need to get her in and regroup sooner than f/u already planned

## 2020-02-19 ENCOUNTER — Ambulatory Visit (INDEPENDENT_AMBULATORY_CARE_PROVIDER_SITE_OTHER): Payer: Commercial Managed Care - PPO | Admitting: Pulmonary Disease

## 2020-02-19 ENCOUNTER — Ambulatory Visit (INDEPENDENT_AMBULATORY_CARE_PROVIDER_SITE_OTHER): Payer: Commercial Managed Care - PPO | Admitting: Internal Medicine

## 2020-02-19 ENCOUNTER — Other Ambulatory Visit: Payer: Self-pay

## 2020-02-19 ENCOUNTER — Encounter: Payer: Self-pay | Admitting: Pulmonary Disease

## 2020-02-19 VITALS — BP 120/74 | HR 87 | Temp 97.7°F | Ht 65.0 in | Wt 196.2 lb

## 2020-02-19 DIAGNOSIS — R0902 Hypoxemia: Secondary | ICD-10-CM | POA: Diagnosis not present

## 2020-02-19 DIAGNOSIS — J449 Chronic obstructive pulmonary disease, unspecified: Secondary | ICD-10-CM | POA: Diagnosis not present

## 2020-02-19 DIAGNOSIS — Z0000 Encounter for general adult medical examination without abnormal findings: Secondary | ICD-10-CM

## 2020-02-19 DIAGNOSIS — Z720 Tobacco use: Secondary | ICD-10-CM

## 2020-02-19 DIAGNOSIS — Z23 Encounter for immunization: Secondary | ICD-10-CM | POA: Diagnosis not present

## 2020-02-19 LAB — PULMONARY FUNCTION TEST
DL/VA % pred: 46 %
DL/VA: 2.03 ml/min/mmHg/L
DLCO cor % pred: 35 %
DLCO cor: 7.86 ml/min/mmHg
DLCO unc % pred: 35 %
DLCO unc: 7.86 ml/min/mmHg
FEF 25-75 Post: 1.79 L/sec
FEF 25-75 Pre: 0.9 L/sec
FEF2575-%Change-Post: 98 %
FEF2575-%Pred-Post: 59 %
FEF2575-%Pred-Pre: 29 %
FEV1-%Change-Post: 21 %
FEV1-%Pred-Post: 61 %
FEV1-%Pred-Pre: 50 %
FEV1-Post: 1.85 L
FEV1-Pre: 1.52 L
FEV1FVC-%Change-Post: 3 %
FEV1FVC-%Pred-Pre: 79 %
FEV6-%Change-Post: 18 %
FEV6-%Pred-Post: 75 %
FEV6-%Pred-Pre: 63 %
FEV6-Post: 2.77 L
FEV6-Pre: 2.33 L
FEV6FVC-%Change-Post: 0 %
FEV6FVC-%Pred-Post: 102 %
FEV6FVC-%Pred-Pre: 101 %
FVC-%Change-Post: 17 %
FVC-%Pred-Post: 73 %
FVC-%Pred-Pre: 62 %
FVC-Post: 2.77 L
FVC-Pre: 2.35 L
Post FEV1/FVC ratio: 67 %
Post FEV6/FVC ratio: 100 %
Pre FEV1/FVC ratio: 65 %
Pre FEV6/FVC Ratio: 99 %
RV % pred: 148 %
RV: 2.58 L
TLC % pred: 105 %
TLC: 5.5 L

## 2020-02-19 MED ORDER — BREZTRI AEROSPHERE 160-9-4.8 MCG/ACT IN AERO: 2.0000 | INHALATION_SPRAY | Freq: Two times a day (BID) | RESPIRATORY_TRACT | 11 refills | Status: DC

## 2020-02-19 NOTE — Progress Notes (Signed)
@Patient  ID: [name], female    DOB: Jul 06, 1975, 45 y.o.   MRN: [id number]  Chief Complaint  Patient presents with  . Follow-up    COPD, breathing is doing well    Referring provider: Alvester Chou, NP  HPI:  45 year old female former smoker found our office for dyspnea on exertion and concern for COPD  PMH: Anemia, polysubstance abuse, alcohol dependence, Smoker/ Smoking History: Former smoker Maintenance:  Breztri  Pt of:  Dr. Melvyn Novas  02/19/2020  - Visit   45 year old female former smoker followed in our office for COPD.  She is established with Dr. Melvyn Novas.  She was last seen in December/2021.  She is a previous [facility] patient.  At last visit in December/2021 she was status post hospitalization in September/2021.  Patient is presenting today after completing pulmonary function testing.  Since last being seen patient was seen in urgent care on 01/22/2020 for chronic pain in both shoulders.  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  She reports that she utilizes OSHA mask guidelines.  There is some dust, as well as smoking and heat in the job that she does.  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  She does feel that she works in a fairly open area.  Patient does not have recent CT imaging of her chest.  Prior to working as a Glass blower/designer she is to work in [employer].  Patient is currently in the process of trying to obtain a license.  She had a DUI when she was younger about 2 decades ago.  She is presenting today with the [facility] ignition interlock medical accommodation form to be completed.   Questionaires / Pulmonary  Flowsheets:   ACT:  No flowsheet data found.  MMRC: No flowsheet data found.  Epworth:  Results of the Epworth flowsheet 03/06/2018  Sitting and reading 2  Watching TV 2  Sitting, inactive in a public place (e.g. a theatre or a meeting) 1  As a passenger in a car for an hour without a break 3  Lying down to rest in the afternoon when circumstances permit 0  Sitting and talking to someone 0  Sitting quietly after a lunch without alcohol 2  In a car, while stopped for a few minutes in traffic 0  Total score 10    Tests:   01/16/2020-CBC with differential-eosinophils relative 12, eosinophils absolute 0.8  01/16/2020-IgE-62  01/16/2020-chest x-ray-no active cardiopulmonary disease  02/19/2020-pulmonary function test-FVC 2.35 (62% predicted), postbronchodilator ratio 67, postbronchodilator FEV1 1.85 (61% addicted), positive bronchodilator response, TLC 5.5 (105% addicted), DLCO 7.86 (35% predicted)  03/06/2018-alpha-1-143, PI*MM  FENO:  No results found for: NITRICOXIDE  PFT: PFT Results Latest Ref Rng & Units 02/19/2020  FVC-Pre L 2.35  FVC-Predicted Pre % 62  FVC-Post L 2.77  FVC-Predicted Post % 73  Pre FEV1/FVC % % 65  Post FEV1/FCV % % 67  FEV1-Pre L 1.52  FEV1-Predicted Pre % 50  FEV1-Post L 1.85  DLCO uncorrected ml/min/mmHg 7.86  DLCO UNC% % 35  DLCO corrected ml/min/mmHg 7.86  DLCO COR %Predicted % 35  DLVA Predicted % 46  TLC L 5.50  TLC % Predicted % 105  RV % Predicted % 148  WALK:  No flowsheet data found.  Imaging: No results found.  Lab Results:  CBC    Component Value Date/Time   WBC 6.9 01/16/2020 1117   RBC 4.07 01/16/2020 1117   HGB 9.5 (L) 01/16/2020 1117   HGB 11.6 (L) 05/13/2013 0449   HCT 32.6 (L) 01/16/2020 1117   HCT 36.2 05/13/2013 0449   PLT 291 01/16/2020 1117   PLT 264 05/13/2013 0449   MCV 80.1 01/16/2020 1117   MCV 82 05/13/2013 0449   MCH 23.3 (L) 01/16/2020 1117   MCHC 29.1 (L) 01/16/2020 1117   RDW 15.4 01/16/2020  1117   RDW 15.3 (H) 05/13/2013 0449   LYMPHSABS 2.3 01/16/2020 1117   LYMPHSABS 2.4 05/13/2013 0449   MONOABS 0.4 01/16/2020 1117   MONOABS 0.9 05/13/2013 0449   EOSABS 0.8 (H) 01/16/2020 1117   EOSABS 0.0 05/13/2013 0449   BASOSABS 0.0 01/16/2020 1117   BASOSABS 0.0 05/13/2013 0449    BMET    Component Value Date/Time   NA 138 10/25/2019 0739   NA 136 05/11/2013 0341   K 4.5 10/25/2019 0739   K 4.1 05/11/2013 0341   CL 105 10/25/2019 0739   CL 106 05/11/2013 0341   CO2 24 10/25/2019 0739   CO2 27 05/11/2013 0341   GLUCOSE 103 (H) 10/25/2019 0739   GLUCOSE 143 (H) 05/11/2013 0341   BUN 18 10/25/2019 0739   BUN 12 05/11/2013 0341   CREATININE 0.58 10/25/2019 0739   CREATININE 0.71 05/15/2013 0629   CALCIUM 9.2 10/25/2019 0739   CALCIUM 8.7 05/11/2013 0341   GFRNONAA >60 10/25/2019 0739   GFRNONAA >60 05/15/2013 0629   GFRAA >60 10/25/2019 0739   GFRAA >60 05/15/2013 0629    BNP No results found for: BNP  ProBNP    Component Value Date/Time   PROBNP 106.5 01/28/2011 2052    Specialty Problems      Pulmonary Problems   Acute bronchitis   COPD exacerbation (HCC)   HCAP (healthcare-associated pneumonia)   Chest pain, pleuritic   Hypoxia   CAP (community acquired pneumonia)   Acute on chronic respiratory failure with hypoxia (HCC)   COPD with acute exacerbation (HCC)   Acute respiratory failure with hypoxia (HCC)   Acute on chronic respiratory failure (HCC)   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 ACOS ?  - alpha one AT phenotype  03/06/2018   MM  level 143  - 01/16/2020  After extensive coaching inhaler device,  effectiveness =    90% try breztri  - Allergy profile 01/16/2020 >  Eos 0.8 /  IgE 62          No Known Allergies  Immunization History  Administered Date(s) Administered  . Influenza,inj,Quad PF,6+ Mos 11/12/2014, 02/13/2016, 12/07/2016, 02/19/2020  . Janssen (J&J)  SARS-COV-2 Vaccination 07/21/2019    Past Medical History:  Diagnosis Date  . Alcohol abuse   . Anemia 01/31/2011  . Asthma   . COPD (chronic obstructive pulmonary disease) (HCC)   . Depression   . Hypoxia 02/01/2011  . Medical history non-contributory   . Needs sleep apnea assessment 2013.07.31  . Tobacco abuse     Tobacco History: Social History   Tobacco Use  Smoking Status Current Every Day Smoker  . Packs/day: 1.00  . Years: 25.00  . Pack years: 25.00  . Types: Cigarettes  Smokeless Tobacco Never Used  Tobacco Comment  4-5 cigs daily 02/19/20   Ready to quit: Yes Counseling given: Yes Comment: 4-5 cigs daily 02/19/20  Patient has resumed smoking.  Currently smoking 4 to 5 cigarettes a day  Smoking assessment and cessation counseling  Patient currently smoking: 4-5 cigarettes a day  I have advised the patient to quit/stop smoking as soon as possible due to high risk for multiple medical problems.  It will also be very difficult for [place] to manage patient's  respiratory symptoms and status if we continue to expose her lungs to a known irritant.  We do not advise e-cigarettes as a form of stopping smoking.  Patient is willing to quit smoking.  Has not set a quit date.  Will consider nicotine replacement therapies.  She will let us know if she becomes interested in these.  I have advised the patient that we can assist and have options of nicotine replacement therapy, provided smoking cessation education today, provided smoking cessation counseling, and provided cessation resources.  Follow-up next office visit office visit for assessment of smoking cessation.    Smoking cessation counseling advised for: 5 minutes    Outpatient Encounter Medications as of 02/19/2020  Medication Sig  . albuterol (PROVENTIL) (2.5 MG/3ML) 0.083% nebulizer solution Take 3 mLs (2.5 mg total) by nebulization every 6 (six) hours as needed for wheezing or shortness of breath. For asthma  .  albuterol (VENTOLIN HFA) 108 (90 Base) MCG/ACT inhaler Inhale 1-2 puffs into the lungs every 6 (six) hours as needed for wheezing or shortness of breath.  . ALPRAZolam (XANAX) 0.25 MG tablet Take 0.25 mg by mouth 2 (two) times daily as needed.  . famotidine (PEPCID) 20 MG tablet One after supper  . FLUoxetine (PROZAC) 40 MG capsule Take 40 mg by mouth daily.  Marland Kitchen levothyroxine (SYNTHROID, LEVOTHROID) 137 MCG tablet Take 137 mcg by mouth daily.  Marland Kitchen lidocaine (XYLOCAINE) 5 % ointment Apply 1 application topically 3 (three) times daily as needed.  . meloxicam (MOBIC) 7.5 MG tablet Take 7.5 mg by mouth 2 (two) times daily.   . montelukast (SINGULAIR) 10 MG tablet Take 10 mg by mouth at bedtime.   . pantoprazole (PROTONIX) 40 MG tablet Take 40 mg by mouth daily.  . predniSONE (DELTASONE) 10 MG tablet Take 2 tablets (20 mg total) by mouth daily.  Marland Kitchen tiZANidine (ZANAFLEX) 2 MG tablet Take 2 mg by mouth every 8 (eight) hours as needed.  . valACYclovir (VALTREX) 1000 MG tablet Take 1 tablet (1,000 mg total) by mouth 3 (three) times daily.  . [DISCONTINUED] Budeson-Glycopyrrol-Formoterol (BREZTRI AEROSPHERE) 160-9-4.8 MCG/ACT AERO Inhale 2 puffs into the lungs 2 (two) times daily.  . Budeson-Glycopyrrol-Formoterol (BREZTRI AEROSPHERE) 160-9-4.8 MCG/ACT AERO Inhale 2 puffs into the lungs 2 (two) times daily.  . [DISCONTINUED] Budeson-Glycopyrrol-Formoterol (BREZTRI AEROSPHERE) 160-9-4.8 MCG/ACT AERO Inhale 2 puffs into the lungs 2 (two) times daily.   No facility-administered encounter medications on file as of 02/19/2020.     Review of Systems  Review of Systems  Constitutional: Positive for fatigue. Negative for activity change and fever.  HENT: Positive for congestion. Negative for sinus pressure, sinus pain and sore throat.   Respiratory: Positive for shortness of breath. Negative for cough and wheezing.   Cardiovascular: Negative for chest pain and palpitations.  Gastrointestinal: Negative for  diarrhea, nausea and vomiting.  Musculoskeletal: Negative for arthralgias.  Neurological: Negative for dizziness.  Psychiatric/Behavioral: Negative for sleep disturbance. The patient is not nervous/anxious.      Physical Exam  BP 120/74 (BP Location:  Left Arm, Cuff Size: Normal)   Pulse 87   Temp 97.7 F (36.5 C) (Oral)   Ht 5\' 5"  (1.651 m)   Wt 196 lb 3.2 oz (89 kg)   SpO2 100%   BMI 32.65 kg/m   Wt Readings from Last 5 Encounters:  02/19/20 196 lb 3.2 oz (89 kg)  01/16/20 188 lb (85.3 kg)  10/24/19 180 lb (81.6 kg)  02/11/19 200 lb (90.7 kg)  03/06/18 207 lb 3.2 oz (94 kg)    BMI Readings from Last 5 Encounters:  02/19/20 32.65 kg/m  01/16/20 31.28 kg/m  10/24/19 29.95 kg/m  02/11/19 33.28 kg/m  03/06/18 34.48 kg/m     Physical Exam Vitals and nursing note reviewed.  Constitutional:      General: She is not in acute distress.    Appearance: Normal appearance. She is normal weight.  HENT:     Head: Normocephalic and atraumatic.     Right Ear: Tympanic membrane, ear canal and external ear normal. There is no impacted cerumen.     Left Ear: Tympanic membrane, ear canal and external ear normal. There is no impacted cerumen.     Nose: Nose normal. No congestion.     Mouth/Throat:     Mouth: Mucous membranes are moist.     Pharynx: Oropharynx is clear.  Eyes:     Pupils: Pupils are equal, round, and reactive to light.  Cardiovascular:     Rate and Rhythm: Normal rate and regular rhythm.     Pulses: Normal pulses.     Heart sounds: Normal heart sounds. No murmur heard.   Pulmonary:     Effort: Pulmonary effort is normal. No respiratory distress.     Breath sounds: No decreased air movement. Wheezing (slight right upper lobe wheeze ) present. No decreased breath sounds or rales.  Musculoskeletal:     Cervical back: Normal range of motion.  Skin:    General: Skin is warm and dry.     Capillary Refill: Capillary refill takes less than 2 seconds.   Neurological:     General: No focal deficit present.     Mental Status: She is alert and oriented to person, place, and time. Mental status is at baseline.     Gait: Gait normal.  Psychiatric:        Mood and Affect: Mood normal.        Behavior: Behavior normal.        Thought Content: Thought content normal.        Judgment: Judgment normal.       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, still smoking Reviewed pulmonary function testing with patient Eosinophilia present on most recent lab work Positive bronchodilator response on pulmonary function testing History of childhood asthma  Plan: Walk today in office stable without any oxygen desaturations Continues 2 L of O2 at night Continue Breztri  Samples provided today, will send in prescription Strongly encourage patient to stop smoking Follow-up in 2 to 4 weeks for repeat spirometry for patient and see DMV ignition interlock accommodation form   Hypoxia Plan: Continue oxygen therapy at night Stable walk in office today without any exertional hypoxemia  Healthcare maintenance Has received first COVID-19 injection-[employer] Plans to receive COVID-19 booster Will receive regular flu vaccine today Patient needs Pneumovax 23 at next office visit Strongly encourage patient to stop smoking  Tobacco abuse Active smoker smoking 4 to 5 cigarettes a day Reviewed pulmonary function  testing Already showing COPD Gold stage II with positive bronchodilator response and severe diffusion defect on PFTs Stable walk in office today without any hypoxemia  Plan: Strongly encourage patient to stop smoking Would recommend referral to lung cancer screening program at age 53    Return in about 6 weeks (around 04/01/2020), or if symptoms worsen or fail to improve, for Follow up with Wyn Quaker FNP-C, Follow up with Dr. Melvyn Novas.   Lauraine Rinne, NP 02/19/2020   This appointment required 42 minutes of patient  care (this includes precharting, chart review, review of results, face-to-face care, etc.).

## 2020-02-19 NOTE — Assessment & Plan Note (Signed)
Active smoker smoking 4 to 5 cigarettes a day Reviewed pulmonary function testing Already showing COPD Gold stage II with positive bronchodilator response and severe diffusion defect on PFTs Stable walk in office today without any hypoxemia  Plan: Strongly encourage patient to stop smoking Would recommend referral to lung cancer screening program at age 45

## 2020-02-19 NOTE — Assessment & Plan Note (Signed)
Has received first COVID-19 injection-[employer] Plans to receive COVID-19 booster Will receive regular flu vaccine today Patient needs Pneumovax 23 at next office visit Strongly encourage patient to stop smoking

## 2020-02-19 NOTE — Assessment & Plan Note (Signed)
Reviewed pulmonary function testing with patient Eosinophilia present on most recent lab work Positive bronchodilator response on pulmonary function testing History of childhood asthma  Plan: Walk today in office stable without any oxygen desaturations Continues 2 L of O2 at night Continue Breztri  Samples provided today, will send in prescription Strongly encourage patient to stop smoking Follow-up in 2 to 4 weeks for repeat spirometry for patient and see DMV ignition interlock accommodation form

## 2020-02-19 NOTE — Assessment & Plan Note (Signed)
Plan: Continue oxygen therapy at night Stable walk in office today without any exertional hypoxemia

## 2020-02-19 NOTE — Progress Notes (Signed)
Full PFT completed today ? ?

## 2020-02-19 NOTE — Patient Instructions (Addendum)
You were seen today by Lauraine Rinne, NP  for:   1. 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  - The less you use it, the better it will work when you need it. - Ok to use up to 2 puffs  every 4 hours if you must but call for immediate appointment if use goes up over your usual need - Don't leave home without it !!  (think of it like the spare tire for your car)   Based on the results of your walk today in office we will discuss your case with Dr. Melvyn Novas to consider whether or not a CT of your chest would be beneficial  2. Tobacco abuse  We recommend that you stop smoking.  >>>You need to set a quit date >>>If you have friends or family who smoke, let them know you are trying to quit and not to smoke around you or in your living environment  Smoking Cessation Resources:  1 800 QUIT NOW  >>> Patient to call this resource and utilize it to help support her quit smoking >>> Keep up your hard work with stopping smoking  You can also contact the [facility] >>>For smoking cessation classes call [phone]  We do not recommend using e-cigarettes as a form of stopping smoking  You can sign up for smoking cessation support texts and information:  >>>https://smokefree.gov/smokefreetxt  3. Healthcare maintenance  Flu vaccine today - regular  4. Hypoxia  Continue oxygen at night  Walk today in office   Continue oxygen therapy as prescribed  >>>maintain oxygen saturations greater than 88 percent  >>>if unable to maintain oxygen saturations please contact the  office  >>>do not smoke with oxygen  >>>can use nasal saline gel or nasal saline rinses to moisturize nose if oxygen causes dryness    We recommend today:  Orders Placed This Encounter  Procedures  . Flu Vaccine QUAD 36+ mos IM (Fluarix, Quad PF)  . Spirometry with graph    Order Specific Question:   Where should this test be performed?    Answer:    Pulmonary   Orders Placed This Encounter  Procedures  . Flu Vaccine QUAD 36+ mos IM (Fluarix, Quad PF)  . Spirometry with graph   No orders of the defined types were placed in this encounter.   Follow Up:    Return in about 6 weeks (around 04/01/2020), or if symptoms worsen or fail to improve, for Follow up with Wyn Quaker FNP-C, Follow up with Dr. Melvyn Novas.   Notification of test results are managed in the following manner: If there are  any recommendations or changes to the  plan of care discussed in office today,  we will contact you and let you know what they are. If you do not hear from [place], then your results are normal and you can view them through your  MyChart account , or a letter will be sent to you. Thank you again for trusting [place] with your care  -  Thank you, [facility]    It is flu season:   >>> Best ways to protect herself from the flu: Receive the yearly flu vaccine, practice good hand hygiene washing with soap and also using hand sanitizer when available, eat a nutritious meals, get adequate rest, hydrate appropriately       Please contact the office if your symptoms worsen or you have concerns that you are not improving.   Thank you for choosing [facility] for your healthcare, and for allowing [place] to partner with you on your healthcare journey. I am thankful to be able to provide care to you today.   Wyn Quaker FNP-C   Influenza Virus Vaccine injection What is this medicine? INFLUENZA VIRUS VACCINE (in floo EN zuh VAHY ruhs vak SEEN) helps to reduce the risk of getting influenza also  known as the flu. The vaccine only helps protect you against some strains of the flu. This medicine may be used for other purposes; ask your health care provider or pharmacist if you have questions. COMMON BRAND NAME(S): Afluria, Afluria Quadrivalent, Agriflu, Alfuria, FLUAD, Fluarix, Fluarix Quadrivalent, Flublok, Flublok Quadrivalent, FLUCELVAX, FLUCELVAX Quadrivalent, Flulaval, Flulaval Quadrivalent, Fluvirin, Fluzone, Fluzone High-Dose, Fluzone Intradermal, Fluzone Quadrivalent What should I tell my health care provider before I take this medicine? They need to know if you have any of these conditions:  bleeding disorder like hemophilia  fever or infection  Guillain-Barre syndrome or other neurological problems  immune system problems  infection with the human immunodeficiency virus (HIV) or AIDS  low blood platelet counts  multiple sclerosis  an unusual or allergic reaction to influenza virus vaccine, latex, other medicines, foods, dyes, or preservatives. Different brands of vaccines contain different allergens. Some may contain latex or eggs. Talk to your doctor about your allergies to make sure that you get the right vaccine.  pregnant or trying to get pregnant  breast-feeding How should I use this medicine? This vaccine is for injection into a muscle or under the skin. It is given by a health care professional. A copy of Vaccine Information Statements will be given before each vaccination. Read this sheet carefully each time. The sheet may change frequently. Talk to your healthcare provider to see which vaccines are right for you. Some vaccines should not be used in all age groups. Overdosage: If you think you have taken too much of this medicine contact a poison control center or emergency room at once. NOTE: This medicine is only for you. Do not share this medicine with others. What if I miss a dose? This does not apply. What may interact with this medicine?  chemotherapy  or radiation therapy  medicines that lower your immune system like etanercept, anakinra, infliximab, and adalimumab  medicines that treat or prevent blood clots like warfarin  phenytoin  steroid medicines like prednisone or cortisone  theophylline  vaccines This list may not describe all possible interactions. Give your health care provider a list of all the medicines, herbs, non-prescription drugs, or dietary supplements you use. Also tell them if you smoke, drink alcohol, or use illegal drugs. Some items may interact with your medicine. What should I watch for while using this medicine? Report any side effects that do not go away within 3 days to your doctor or health care professional. Call your health care provider if any unusual symptoms occur within 6 weeks of receiving this vaccine. You may still catch the flu, but the illness is not usually as bad. You cannot get  the flu from the vaccine. The vaccine will not protect against colds or other illnesses that may cause fever. The vaccine is needed every year. What side effects may I notice from receiving this medicine? Side effects that you should report to your doctor or health care professional as soon as possible:  allergic reactions like skin rash, itching or hives, swelling of the face, lips, or tongue Side effects that usually do not require medical attention (report to your doctor or health care professional if they continue or are bothersome):  fever  headache  muscle aches and pains  pain, tenderness, redness, or swelling at the injection site  tiredness This list may not describe all possible side effects. Call your doctor for medical advice about side effects. You may report side effects to FDA at 1-800-FDA-1088. Where should I keep my medicine? The vaccine will be given by a health care professional in a clinic, pharmacy, doctor's office, or other health care setting. You will not be given vaccine doses to store at  home. NOTE: This sheet is a summary. It may not cover all possible information. If you have questions about this medicine, talk to your doctor, pharmacist, or health care provider.  2020 Elsevier/Gold Standard (2017-12-25 08:45:43)    Health Risks of Smoking Smoking cigarettes is very bad for your health. Tobacco smoke has over 200 known poisons in it. It contains the poisonous gases nitrogen oxide and carbon monoxide. There are over 60 chemicals in tobacco smoke that cause cancer. Smoking is difficult to quit because a chemical in tobacco, called nicotine, causes addiction or dependence. When you smoke and inhale, nicotine is absorbed rapidly into the bloodstream through your lungs. Both inhaled and non-inhaled nicotine may be addictive. What are the risks of cigarette smoke? Cigarette smokers have an increased risk of many serious medical problems, including:  Lung cancer.  Lung disease, such as pneumonia, bronchitis, and emphysema.  Chest pain (angina) and heart attack because the heart is not getting enough oxygen.  Heart disease and peripheral blood vessel disease.  High blood pressure (hypertension).  Stroke.  Oral cancer, including cancer of the lip, mouth, or voice box.  Bladder cancer.  Pancreatic cancer.  Cervical cancer.  Pregnancy complications, including premature birth.  Stillbirths and smaller newborn babies, birth defects, and genetic damage to sperm.  Early menopause.  Lower estrogen level for women.  Infertility.  Facial wrinkles.  Blindness.  Increased risk of broken bones (fractures).  Senile dementia.  Stomach ulcers and internal bleeding.  Delayed wound healing and increased risk of complications during surgery.  Even smoking lightly shortens your life expectancy by several years. Because of secondhand smoke exposure, children of smokers have an increased risk of the following:  Sudden infant death syndrome (SIDS).  Respiratory  infections.  Lung cancer.  Heart disease.  Ear infections. What are the benefits of quitting? There are many health benefits of quitting smoking. Here are some of them:  Within days of quitting smoking, your risk of having a heart attack decreases, your blood flow improves, and your lung capacity improves. Blood pressure, pulse rate, and breathing patterns start returning to normal soon after quitting.  Within months, your lungs may clear up completely.  Quitting for 10 years reduces your risk of developing lung cancer and heart disease to almost that of a nonsmoker.  People who quit may see an improvement in their overall quality of life. How do I quit smoking?     Smoking is an addiction with  both physical and psychological effects, and longtime habits can be hard to change. Your health care provider can recommend:  Programs and community resources, which may include group support, education, or talk therapy.  Prescription medicines to help reduce cravings.  Nicotine replacement products, such as patches, gum, and nasal sprays. Use these products only as directed. Do not replace cigarette smoking with electronic cigarettes, which are commonly called e-cigarettes. The safety of e-cigarettes is not known, and some may contain harmful chemicals.  A combination of two or more of these methods. Where to find more information  American Lung Association: www.lung.org  American Cancer Society: www.cancer.org Summary  Smoking cigarettes is very bad for your health. Cigarette smokers have an increased risk of many serious medical problems, including several cancers, heart disease, and stroke.  Smoking is an addiction with both physical and psychological effects, and longtime habits can be hard to change.  By stopping right away, you can greatly reduce the risk of medical problems for you and your family.  To help you quit smoking, your health care provider can recommend programs,  community resources, prescription medicines, and nicotine replacement products such as patches, gum, and nasal sprays. This information is not intended to replace advice given to you by your health care provider. Make sure you discuss any questions you have with your health care provider. Document Revised: 05/03/2017 Document Reviewed: 02/04/2016 Elsevier Patient Education  2020 [employer].

## 2020-02-26 ENCOUNTER — Ambulatory Visit: Payer: Commercial Managed Care - PPO | Admitting: Pulmonary Disease

## 2020-02-26 ENCOUNTER — Ambulatory Visit: Payer: Commercial Managed Care - PPO | Admitting: Adult Health

## 2020-03-01 ENCOUNTER — Ambulatory Visit: Payer: Commercial Managed Care - PPO | Admitting: Adult Health

## 2020-03-02 ENCOUNTER — Other Ambulatory Visit: Payer: Self-pay

## 2020-03-02 ENCOUNTER — Ambulatory Visit: Payer: Commercial Managed Care - PPO | Admitting: Primary Care

## 2020-03-02 ENCOUNTER — Encounter: Payer: Self-pay | Admitting: Primary Care

## 2020-03-02 VITALS — BP 112/64 | HR 76 | Temp 99.2°F | Ht 65.0 in | Wt 189.8 lb

## 2020-03-02 DIAGNOSIS — J449 Chronic obstructive pulmonary disease, unspecified: Secondary | ICD-10-CM

## 2020-03-02 NOTE — Assessment & Plan Note (Signed)
-   Unable to perform spirometry testing today for [facility] medical accomodation form d/t recent covid infection. She will need to reschedule test 90 days after testing positive - No changes to medical treatment were made today

## 2020-03-02 NOTE — Patient Instructions (Addendum)
-   Unable to perform spirometry testing today for [facility] medical accomodation form d/t recent covid infection  - No changes were made today  Follow-up: - You can reschedule spirometry 90 days after testing positive

## 2020-03-02 NOTE — Progress Notes (Signed)
@Patient  ID: [name], female    DOB: 12-May-1975, 45 y.o.   MRN: [id number]  Chief Complaint  Patient presents with  . Follow-up    2 week rov with spirometry - Feeling ok - Occas sob - occas non prod cough ( light green last night)  - Pt had covid a couple weeks ago - Took Zpak and Prednisone at that time. Denies fever, chills, sweats - Pt reports no smoking since here last    Referring provider: Alvester Chou, NP  HPI: 45 year old female, current everyday smoker (25 pack year hx). PMH significant for COPD GOLD II, CAP, acute respiratory failure, hypothyroidism, alcohol dependence, major depression, anemia, tobacco abuse. Patient of Dr. Melvyn Novas, last seen by pulmonary NP on 02/19/20.  Previous [facility] pulmonary encounter: 02/19/2020  - Visit  45 year old female former smoker followed in our office for COPD.  She is established with Dr. Melvyn Novas.  She was last seen in December/2021.  She is a previous [facility] patient.  At last visit in December/2021 she was status post hospitalization in September/2021.  Patient is presenting today after completing pulmonary function testing.  Since last being seen patient was seen in urgent care on 01/22/2020 for chronic pain in both shoulders.  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  She reports that she utilizes OSHA mask guidelines.  There is some dust, as well as smoking and heat in the job that she does.  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  She does feel that she works in a fairly open area.  Patient does not have recent CT imaging of her chest.  Prior to working as a Glass blower/designer she is to work in [employer].  Patient is currently in the process of trying to obtain a license.  She had a DUI when she was younger about 2 decades ago.  She is presenting today with the [facility] ignition interlock medical accommodation form to be completed.   03/02/2020- Interim hx Patient presents  today for 1 week follow-up with Spirometry. Maintained on Breztri. Uses oxygen at night. Received flu vaccine last week. She has shortness of breath with activities. She is compliant with Breztri two puffs twice daily. She requires her albuterol rescue in 2-3 times a day. No acute symptoms today. She tested positive for COVID 2 weeks ago at an [facility] care, states that she was treated with Vit C, zithromax and prednisone. She did not require hospitalization and has no residual symptoms. She is looking to obtain her drivers license. She got a DUI over 20 years ago. Reports that she has been sober for 4 years. Needing two spirometry tests to show that she is unable to use interlock system. Denies chest tightness, wheezing or cough.   Tests:   01/16/2020-CBC with differential-eosinophils relative 12, eosinophils absolute 0.8  01/16/2020-IgE-62  01/16/2020-chest x-ray-no active cardiopulmonary disease  02/19/2020-pulmonary function test-FVC 2.35 (62% predicted), postbronchodilator ratio 67, postbronchodilator FEV1 1.85 (61% addicted), positive bronchodilator response, TLC 5.5 (105% addicted), DLCO 7.86 (35% predicted)  03/06/2018-alpha-1-143, PI*MM   02/19/2020-pulmonary function test-FVC 2.35 (62% predicted), postbronchodilator ratio 67, postbronchodilator FEV1 1.85 (61% addicted), positive bronchodilator response, TLC 5.5 (105% addicted), DLCO 7.86 (35% predicted)   No Known Allergies  Immunization History  Administered Date(s) Administered  . Influenza,inj,Quad PF,6+ Mos 11/12/2014, 02/13/2016, 12/07/2016, 02/19/2020  . Janssen (J&J) SARS-COV-2 Vaccination 07/21/2019    Past Medical History:  Diagnosis Date  . Alcohol  abuse   . Anemia 01/31/2011  . Asthma   . COPD (chronic obstructive pulmonary disease) ([place])   . Depression   . Hypoxia 02/01/2011  . Medical history non-contributory   . Needs sleep apnea assessment 2013.07.31  . Tobacco abuse     Tobacco  History: Social History   Tobacco Use  Smoking Status Current Every Day Smoker  . Packs/day: 1.00  . Years: 25.00  . Pack years: 25.00  . Types: Cigarettes  Smokeless Tobacco Never Used  Tobacco Comment   4-5 cigs daily 02/19/20   Ready to quit: Not Answered Counseling given: Not Answered Comment: 4-5 cigs daily 02/19/20   Outpatient Medications Prior to Visit  Medication Sig Dispense Refill  . albuterol (PROVENTIL) (2.5 MG/3ML) 0.083% nebulizer solution Take 3 mLs (2.5 mg total) by nebulization every 6 (six) hours as needed for wheezing or shortness of breath. For asthma 75 mL 1  . albuterol (VENTOLIN HFA) 108 (90 Base) MCG/ACT inhaler Inhale 1-2 puffs into the lungs every 6 (six) hours as needed for wheezing or shortness of breath. 6.7 g 0  . ALPRAZolam (XANAX) 0.25 MG tablet Take 0.25 mg by mouth 2 (two) times daily as needed.    . Budeson-Glycopyrrol-Formoterol (BREZTRI AEROSPHERE) 160-9-4.8 MCG/ACT AERO Inhale 2 puffs into the lungs 2 (two) times daily. 10.7 g 11  . famotidine (PEPCID) 20 MG tablet One after supper 30 tablet 11  . FLUoxetine (PROZAC) 40 MG capsule Take 40 mg by mouth daily.    Marland Kitchen levothyroxine (SYNTHROID, LEVOTHROID) 137 MCG tablet Take 137 mcg by mouth daily.  0  . lidocaine (XYLOCAINE) 5 % ointment Apply 1 application topically 3 (three) times daily as needed. 35.44 g 0  . meloxicam (MOBIC) 7.5 MG tablet Take 7.5 mg by mouth 2 (two) times daily.     . montelukast (SINGULAIR) 10 MG tablet Take 10 mg by mouth at bedtime.     . pantoprazole (PROTONIX) 40 MG tablet Take 40 mg by mouth daily.    . valACYclovir (VALTREX) 1000 MG tablet Take 1 tablet (1,000 mg total) by mouth 3 (three) times daily. 21 tablet 0  . predniSONE (DELTASONE) 10 MG tablet Take 2 tablets (20 mg total) by mouth daily. (Patient not taking: Reported on 03/02/2020) 15 tablet 0  . tiZANidine (ZANAFLEX) 2 MG tablet Take 2 mg by mouth every 8 (eight) hours as needed. (Patient not taking: Reported on  03/02/2020)     No facility-administered medications prior to visit.   Review of Systems  Review of Systems  Constitutional: Negative.   Respiratory: Positive for shortness of breath. Negative for cough, chest tightness and wheezing.   Cardiovascular: Negative.    Physical Exam  BP 112/64   Pulse 76   Temp 99.2 F (37.3 C) (Oral)   Ht 5\' 5"  (1.651 m)   Wt 189 lb 12.8 oz (86.1 kg)   SpO2 97%   BMI 31.58 kg/m  Physical Exam Constitutional:      General: She is not in acute distress.    Appearance: Normal appearance. She is not ill-appearing.  HENT:     Mouth/Throat:     Mouth: Mucous membranes are moist.     Pharynx: Oropharynx is clear.  Cardiovascular:     Rate and Rhythm: Normal rate and regular rhythm.     Comments: No edema Pulmonary:     Effort: Pulmonary effort is normal.     Breath sounds: Normal breath sounds. No wheezing, rhonchi or rales.  Comments: CTA Musculoskeletal:        General: Normal range of motion.     Cervical back: Normal range of motion and neck supple.  Neurological:     General: No focal deficit present.     Mental Status: She is alert and oriented to person, place, and time. Mental status is at baseline.  Psychiatric:        Mood and Affect: Mood normal.        Behavior: Behavior normal.        Thought Content: Thought content normal.        Judgment: Judgment normal.      Lab Results:  CBC    Component Value Date/Time   WBC 6.9 01/16/2020 1117   RBC 4.07 01/16/2020 1117   HGB 9.5 (L) 01/16/2020 1117   HGB 11.6 (L) 05/13/2013 0449   HCT 32.6 (L) 01/16/2020 1117   HCT 36.2 05/13/2013 0449   PLT 291 01/16/2020 1117   PLT 264 05/13/2013 0449   MCV 80.1 01/16/2020 1117   MCV 82 05/13/2013 0449   MCH 23.3 (L) 01/16/2020 1117   MCHC 29.1 (L) 01/16/2020 1117   RDW 15.4 01/16/2020 1117   RDW 15.3 (H) 05/13/2013 0449   LYMPHSABS 2.3 01/16/2020 1117   LYMPHSABS 2.4 05/13/2013 0449   MONOABS 0.4 01/16/2020 1117   MONOABS 0.9  05/13/2013 0449   EOSABS 0.8 (H) 01/16/2020 1117   EOSABS 0.0 05/13/2013 0449   BASOSABS 0.0 01/16/2020 1117   BASOSABS 0.0 05/13/2013 0449    BMET    Component Value Date/Time   NA 138 10/25/2019 0739   NA 136 05/11/2013 0341   K 4.5 10/25/2019 0739   K 4.1 05/11/2013 0341   CL 105 10/25/2019 0739   CL 106 05/11/2013 0341   CO2 24 10/25/2019 0739   CO2 27 05/11/2013 0341   GLUCOSE 103 (H) 10/25/2019 0739   GLUCOSE 143 (H) 05/11/2013 0341   BUN 18 10/25/2019 0739   BUN 12 05/11/2013 0341   CREATININE 0.58 10/25/2019 0739   CREATININE 0.71 05/15/2013 0629   CALCIUM 9.2 10/25/2019 0739   CALCIUM 8.7 05/11/2013 0341   GFRNONAA >60 10/25/2019 0739   GFRNONAA >60 05/15/2013 0629   GFRAA >60 10/25/2019 0739   GFRAA >60 05/15/2013 0629    BNP No results found for: BNP  ProBNP    Component Value Date/Time   PROBNP 106.5 01/28/2011 2052    Imaging: No results found.   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 She will need to reschedule test 90 days after testing positive - No changes to medical treatment were made today   Martyn Ehrich, NP 03/02/2020

## 2020-03-02 NOTE — Addendum Note (Signed)
Addended by: Doroteo Glassman D on: 03/02/2020 03:08 PM   Modules accepted: Orders

## 2020-03-05 ENCOUNTER — Telehealth: Payer: Self-pay | Admitting: Primary Care

## 2020-03-05 NOTE — Telephone Encounter (Signed)
Called and spoke with pt and she is aware of spirometry that has been scheduled for 4-1 at 12.   Nothing further is needed

## 2020-05-11 ENCOUNTER — Other Ambulatory Visit (HOSPITAL_COMMUNITY)
Admission: RE | Admit: 2020-05-11 | Discharge: 2020-05-11 | Disposition: A | Discharge status: Home / Self Care | Payer: Commercial Managed Care - PPO | Source: Ambulatory Visit | Attending: Primary Care | Admitting: Primary Care

## 2020-05-11 DIAGNOSIS — Z01812 Encounter for preprocedural laboratory examination: Secondary | ICD-10-CM | POA: Insufficient documentation

## 2020-05-11 DIAGNOSIS — Z20822 Contact with and (suspected) exposure to covid-19: Secondary | ICD-10-CM | POA: Insufficient documentation

## 2020-05-11 LAB — SARS CORONAVIRUS 2 (TAT 6-24 HRS): SARS Coronavirus 2: NEGATIVE

## 2020-05-13 ENCOUNTER — Other Ambulatory Visit: Payer: Self-pay | Admitting: *Deleted

## 2020-05-13 DIAGNOSIS — J449 Chronic obstructive pulmonary disease, unspecified: Secondary | ICD-10-CM

## 2020-05-14 ENCOUNTER — Other Ambulatory Visit: Payer: Self-pay

## 2020-05-14 ENCOUNTER — Telehealth: Payer: Self-pay | Admitting: *Deleted

## 2020-05-14 ENCOUNTER — Ambulatory Visit (INDEPENDENT_AMBULATORY_CARE_PROVIDER_SITE_OTHER): Payer: Commercial Managed Care - PPO | Admitting: Internal Medicine

## 2020-05-14 DIAGNOSIS — J449 Chronic obstructive pulmonary disease, unspecified: Secondary | ICD-10-CM | POA: Diagnosis not present

## 2020-05-14 LAB — PULMONARY FUNCTION TEST
FEF 25-75 Pre: 1.16 L/sec
FEF2575-%Pred-Pre: 38 %
FEV1-%Pred-Pre: 54 %
FEV1-Pre: 1.65 L
FEV1FVC-%Pred-Pre: 83 %
FEV6-%Pred-Pre: 66 %
FEV6-Pre: 2.44 L
FEV6FVC-%Pred-Pre: 102 %
FVC-%Pred-Pre: 64 %
FVC-Pre: 2.44 L
Pre FEV1/FVC ratio: 68 %
Pre FEV6/FVC Ratio: 100 %

## 2020-05-14 NOTE — Progress Notes (Signed)
Spirometry only performed today.

## 2020-05-14 NOTE — Telephone Encounter (Signed)
Patient had final spirometry needed to complete DMW form. Patient states she got a 1 week extension and forms need to be completed ASAP. Form has been placed in BW box.

## 2020-05-14 NOTE — Patient Instructions (Signed)
Spirometry performed today. 

## 2020-05-18 NOTE — Telephone Encounter (Signed)
I will get to this today and put in my outbox, sorry for delay I was out of office.

## 2020-05-19 NOTE — Telephone Encounter (Signed)
I put in my out box yesterday to either mail to her or have her pick up

## 2020-05-19 NOTE — Telephone Encounter (Signed)
Checked with Beth to see where she had placed the form once it was filled out and she said that it was placed on the wall at the folders. With this being said, this might've been placed in the mail yesterday 4/5 for pt as I did not see it in the outgoing mail bin when checked today 4/6 nor did I see it in the file cabinet up front.  Called and spoke with pt and she verbalized understanding. Nothing further needed.

## 2020-05-19 NOTE — Telephone Encounter (Signed)
Pt called back asking for update  I let her know of Beth's response from 05/18/20  Beth, please route this to triage or your nurse who is with you today once you are done with her form so they can update her, thanks!

## 2020-05-26 NOTE — Progress Notes (Signed)
Called and went over PFT results per Dr Melvyn Novas with patient. All questions answered and patient expressed full understanding. 3 month recall placed per Dr Melvyn Novas since schedule in July is not open yet. Patient aware to schedule office visit with Dr Melvyn Novas in July 2022 or call if need anything sooner. Nothing further needed at this time.

## 2020-12-09 ENCOUNTER — Other Ambulatory Visit: Payer: Self-pay

## 2020-12-09 ENCOUNTER — Emergency Department (HOSPITAL_COMMUNITY): Payer: Commercial Managed Care - PPO

## 2020-12-09 ENCOUNTER — Encounter (HOSPITAL_COMMUNITY): Payer: Self-pay

## 2020-12-09 ENCOUNTER — Observation Stay (HOSPITAL_COMMUNITY)
Admission: EM | Admit: 2020-12-09 | Discharge: 2020-12-11 | Disposition: A | Discharge status: Home / Self Care | Payer: Commercial Managed Care - PPO | Attending: Internal Medicine | Admitting: Internal Medicine

## 2020-12-09 DIAGNOSIS — J9601 Acute respiratory failure with hypoxia: Secondary | ICD-10-CM | POA: Diagnosis not present

## 2020-12-09 DIAGNOSIS — F419 Anxiety disorder, unspecified: Secondary | ICD-10-CM | POA: Diagnosis present

## 2020-12-09 DIAGNOSIS — Z79899 Other long term (current) drug therapy: Secondary | ICD-10-CM | POA: Diagnosis not present

## 2020-12-09 DIAGNOSIS — Z9981 Dependence on supplemental oxygen: Secondary | ICD-10-CM

## 2020-12-09 DIAGNOSIS — J9611 Chronic respiratory failure with hypoxia: Secondary | ICD-10-CM | POA: Diagnosis present

## 2020-12-09 DIAGNOSIS — Z8249 Family history of ischemic heart disease and other diseases of the circulatory system: Secondary | ICD-10-CM

## 2020-12-09 DIAGNOSIS — Z833 Family history of diabetes mellitus: Secondary | ICD-10-CM

## 2020-12-09 DIAGNOSIS — Z79890 Hormone replacement therapy (postmenopausal): Secondary | ICD-10-CM

## 2020-12-09 DIAGNOSIS — J029 Acute pharyngitis, unspecified: Secondary | ICD-10-CM | POA: Diagnosis present

## 2020-12-09 DIAGNOSIS — E669 Obesity, unspecified: Secondary | ICD-10-CM | POA: Diagnosis present

## 2020-12-09 DIAGNOSIS — J441 Chronic obstructive pulmonary disease with (acute) exacerbation: Secondary | ICD-10-CM | POA: Diagnosis not present

## 2020-12-09 DIAGNOSIS — Z7952 Long term (current) use of systemic steroids: Secondary | ICD-10-CM | POA: Diagnosis not present

## 2020-12-09 DIAGNOSIS — F17210 Nicotine dependence, cigarettes, uncomplicated: Secondary | ICD-10-CM | POA: Insufficient documentation

## 2020-12-09 DIAGNOSIS — Z7951 Long term (current) use of inhaled steroids: Secondary | ICD-10-CM | POA: Diagnosis not present

## 2020-12-09 DIAGNOSIS — Z20822 Contact with and (suspected) exposure to covid-19: Secondary | ICD-10-CM | POA: Diagnosis not present

## 2020-12-09 DIAGNOSIS — E039 Hypothyroidism, unspecified: Secondary | ICD-10-CM | POA: Diagnosis not present

## 2020-12-09 DIAGNOSIS — R0602 Shortness of breath: Secondary | ICD-10-CM | POA: Diagnosis not present

## 2020-12-09 DIAGNOSIS — Z6833 Body mass index (BMI) 33.0-33.9, adult: Secondary | ICD-10-CM

## 2020-12-09 DIAGNOSIS — F32A Depression, unspecified: Secondary | ICD-10-CM | POA: Diagnosis present

## 2020-12-09 LAB — RESP PANEL BY RT-PCR (FLU A&B, COVID) ARPGX2
Influenza A by PCR: NEGATIVE
Influenza B by PCR: NEGATIVE
SARS Coronavirus 2 by RT PCR: NEGATIVE

## 2020-12-09 LAB — CBC
HCT: 32.1 % — ABNORMAL LOW (ref 36.0–46.0)
Hemoglobin: 9.7 g/dL — ABNORMAL LOW (ref 12.0–15.0)
MCH: 24.1 pg — ABNORMAL LOW (ref 26.0–34.0)
MCHC: 30.2 g/dL (ref 30.0–36.0)
MCV: 79.9 fL — ABNORMAL LOW (ref 80.0–100.0)
Platelets: 272 10*3/uL (ref 150–400)
RBC: 4.02 MIL/uL (ref 3.87–5.11)
RDW: 15.5 % (ref 11.5–15.5)
WBC: 8.8 10*3/uL (ref 4.0–10.5)
nRBC: 0 % (ref 0.0–0.2)

## 2020-12-09 LAB — BASIC METABOLIC PANEL
Anion gap: 5 (ref 5–15)
BUN: 15 mg/dL (ref 6–20)
CO2: 24 mmol/L (ref 22–32)
Calcium: 8.6 mg/dL — ABNORMAL LOW (ref 8.9–10.3)
Chloride: 107 mmol/L (ref 98–111)
Creatinine, Ser: 0.83 mg/dL (ref 0.44–1.00)
GFR, Estimated: >60 mL/min (ref >60)
Glucose, Bld: 107 mg/dL — ABNORMAL HIGH (ref 70–99)
Potassium: 3.5 mmol/L (ref 3.5–5.1)
Sodium: 136 mmol/L (ref 135–145)

## 2020-12-09 MED ORDER — IPRATROPIUM BROMIDE 0.02 % IN SOLN: 0.5000 mg | Freq: Once | RESPIRATORY_TRACT | Status: DC

## 2020-12-09 MED ORDER — ACETAMINOPHEN 500 MG PO TABS
1000.0000 mg | ORAL_TABLET | Freq: Once | ORAL | Status: AC
  Administered 2020-12-09: 1000 mg via ORAL
  Filled 2020-12-09: qty 2

## 2020-12-09 MED ORDER — METHYLPREDNISOLONE SODIUM SUCC 125 MG IJ SOLR
125.0000 mg | Freq: Once | INTRAMUSCULAR | Status: AC
  Administered 2020-12-09: 125 mg via INTRAVENOUS
  Filled 2020-12-09: qty 2

## 2020-12-09 MED ORDER — ALBUTEROL SULFATE (2.5 MG/3ML) 0.083% IN NEBU
2.5000 mg | INHALATION_SOLUTION | Freq: Once | RESPIRATORY_TRACT | Status: AC
  Administered 2020-12-10: 2.5 mg via RESPIRATORY_TRACT
  Filled 2020-12-09: qty 3

## 2020-12-09 MED ORDER — PHENOL 1.4 % MT LIQD
1.0000 | OROMUCOSAL | Status: DC | PRN
  Administered 2020-12-10: 1 via OROMUCOSAL
  Filled 2020-12-09 (×2): qty 177

## 2020-12-09 MED ORDER — ALBUTEROL SULFATE (2.5 MG/3ML) 0.083% IN NEBU: 5.0000 mg | INHALATION_SOLUTION | Freq: Once | RESPIRATORY_TRACT | Status: DC

## 2020-12-09 MED ORDER — ALBUTEROL SULFATE (2.5 MG/3ML) 0.083% IN NEBU
5.0000 mg | INHALATION_SOLUTION | Freq: Once | RESPIRATORY_TRACT | Status: AC
  Administered 2020-12-09: 5 mg via RESPIRATORY_TRACT
  Filled 2020-12-09: qty 6

## 2020-12-09 MED ORDER — MORPHINE SULFATE (PF) 4 MG/ML IV SOLN
4.0000 mg | Freq: Once | INTRAVENOUS | Status: AC
  Administered 2020-12-10: 4 mg via INTRAVENOUS
  Filled 2020-12-09: qty 1

## 2020-12-09 MED ORDER — IPRATROPIUM-ALBUTEROL 0.5-2.5 (3) MG/3ML IN SOLN
3.0000 mL | Freq: Once | RESPIRATORY_TRACT | Status: AC
  Administered 2020-12-09: 3 mL via RESPIRATORY_TRACT

## 2020-12-09 MED ORDER — ALBUTEROL SULFATE HFA 108 (90 BASE) MCG/ACT IN AERS: 2.0000 | INHALATION_SPRAY | RESPIRATORY_TRACT | Status: DC | PRN

## 2020-12-09 MED ORDER — MAGNESIUM SULFATE 2 GM/50ML IV SOLN
2.0000 g | Freq: Once | INTRAVENOUS | Status: AC
  Administered 2020-12-09: 2 g via INTRAVENOUS
  Filled 2020-12-09: qty 50

## 2020-12-09 MED ORDER — ALBUTEROL SULFATE (2.5 MG/3ML) 0.083% IN NEBU
2.5000 mg | INHALATION_SOLUTION | Freq: Once | RESPIRATORY_TRACT | Status: AC
  Administered 2020-12-09: 2.5 mg via RESPIRATORY_TRACT

## 2020-12-09 MED ORDER — IPRATROPIUM BROMIDE 0.02 % IN SOLN
0.5000 mg | Freq: Once | RESPIRATORY_TRACT | Status: AC
  Administered 2020-12-09: 0.5 mg via RESPIRATORY_TRACT
  Filled 2020-12-09: qty 2.5

## 2020-12-09 MED ORDER — IPRATROPIUM-ALBUTEROL 0.5-2.5 (3) MG/3ML IN SOLN
3.0000 mL | Freq: Once | RESPIRATORY_TRACT | Status: AC
  Administered 2020-12-10: 3 mL via RESPIRATORY_TRACT
  Filled 2020-12-09: qty 3

## 2020-12-09 NOTE — ED Triage Notes (Signed)
Shortness of breath since yesterday; worse today. Also c/o chest pain and cough. Hx of COPD. 94% on room air after ambulating. 98% while lying in  bed on room air.  Audible wheezing.

## 2020-12-09 NOTE — ED Provider Notes (Signed)
[facility] EMERGENCY DEPARTMENT Provider Note   CSN: [id number] Arrival date & time: 12/09/20  2053     History Chief Complaint  Patient presents with   Shortness of Breath    [name] is a 45 y.o. female.  Pt w hx copd, c/o increased wheezing and sob in the past few days. Symptoms acute onset, moderate-sev, constant, persistent. Feels tight in chest. +non prod cough. No sore throat or runny nose. No specific known ill contacts. States compliant w meds. Was smoker for many years, states quit 3 weeks ago. Denies exertional/episodic chest pain or discomfort. No pleuritic pain. No leg pain or swelling. No recent surgery, immobility, trauma or travel.   The history is provided by the patient and medical records.  Shortness of Breath Associated symptoms: cough and wheezing   Associated symptoms: no abdominal pain, no fever, no headaches, no neck pain, no rash, no sore throat and no vomiting       Past Medical History:  Diagnosis Date   Alcohol abuse    Anemia 01/31/2011   Asthma    COPD (chronic obstructive pulmonary disease) ([place])    Depression    Hypoxia 02/01/2011   Medical history non-contributory    Needs sleep apnea assessment 2013.07.31   Tobacco abuse     Patient Active Problem List   Diagnosis Date Noted   Healthcare maintenance 02/19/2020   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, still smoking 01/16/2020   Acute on chronic respiratory failure ([place]) 02/04/2018   Acute respiratory failure with hypoxia ([place]) 04/29/2017   Leukocytosis 12/07/2016   Rhinovirus infection 12/07/2016   Acute on chronic respiratory failure with hypoxia ([place]) 12/06/2016   COPD with acute exacerbation ([place]) 12/06/2016   Hypokalemia 02/10/2016   Hypothyroidism 02/10/2016   CAP (community acquired pneumonia) 01/19/2013   Dehydration 01/19/2013   Nausea vomiting and diarrhea 01/19/2013   Alcohol dependence ([place]) 01/16/2012   Alcohol withdrawal ([place]) 01/16/2012   Hypoxia  02/01/2011   Hyponatremia 02/01/2011   Anemia 01/31/2011   Chest pain, pleuritic 01/29/2011   Back pain 01/29/2011   HCAP (healthcare-associated pneumonia) 01/28/2011   Acute bronchitis 01/08/2011   COPD exacerbation ([place]) 01/08/2011   Tobacco abuse 01/08/2011   Polysubstance abuse ([place]) 01/08/2011   Major depression 01/08/2011    Past Surgical History:  Procedure Laterality Date   ANKLE GANGLION CYST EXCISION     BACK SURGERY     CESAREAN SECTION     COLONOSCOPY WITH PROPOFOL N/A 06/27/2016   Procedure: COLONOSCOPY WITH PROPOFOL;  Surgeon: Jonathon Bellows, MD;  Location: [facility] ENDOSCOPY;  Service: Endoscopy;  Laterality: N/A;   DILATION AND CURETTAGE OF UTERUS     ESOPHAGOGASTRODUODENOSCOPY (EGD) WITH PROPOFOL N/A 06/27/2016   Procedure: ESOPHAGOGASTRODUODENOSCOPY (EGD) WITH PROPOFOL;  Surgeon: Jonathon Bellows, MD;  Location: [facility] ENDOSCOPY;  Service: Endoscopy;  Laterality: N/A;     OB History   No obstetric history on file.     Family History  Problem Relation Age of Onset   Hypothyroidism Mother    Coronary artery disease Mother    Throat cancer Mother    Diabetes Maternal Grandmother     Social History   Tobacco Use   Smoking status: Every Day    Packs/day: 1.00    Years: 25.00    Pack years: 25.00    Types: Cigarettes   Smokeless tobacco: Never   Tobacco comments:    4-5 cigs daily 02/19/20  Vaping Use   Vaping Use: Some days  Substance  Use Topics   Alcohol use: Yes   Drug use: No    Types: "Crack" cocaine    Comment: crack-Not since September    Home Medications Prior to Admission medications   Medication Sig Start Date End Date Taking? Authorizing Provider  albuterol (PROVENTIL) (2.5 MG/3ML) 0.083% nebulizer solution Take 3 mLs (2.5 mg total) by nebulization every 6 (six) hours as needed for wheezing or shortness of breath. For asthma 12/07/16   Raiford Noble Latif, DO  albuterol (VENTOLIN HFA) 108 (90 Base) MCG/ACT inhaler Inhale 1-2 puffs into the lungs  every 6 (six) hours as needed for wheezing or shortness of breath. 02/11/19   Noemi Chapel, MD  ALPRAZolam Duanne Moron) 0.25 MG tablet Take 0.25 mg by mouth 2 (two) times daily as needed. 10/09/19   [provider]  Budeson-Glycopyrrol-Formoterol (BREZTRI AEROSPHERE) 160-9-4.8 MCG/ACT AERO Inhale 2 puffs into the lungs 2 (two) times daily. 02/19/20   Lauraine Rinne, NP  famotidine (PEPCID) 20 MG tablet One after supper 01/16/20   Tanda Rockers, MD  FLUoxetine (PROZAC) 40 MG capsule Take 40 mg by mouth daily. 08/05/16   [provider]  levothyroxine (SYNTHROID, LEVOTHROID) 137 MCG tablet Take 137 mcg by mouth daily. 11/22/16   [provider]  lidocaine (XYLOCAINE) 5 % ointment Apply 1 application topically 3 (three) times daily as needed. 09/19/19   Long, Wonda Olds, MD  meloxicam (MOBIC) 7.5 MG tablet Take 7.5 mg by mouth 2 (two) times daily.  09/23/19   [provider]  montelukast (SINGULAIR) 10 MG tablet Take 10 mg by mouth at bedtime.     [provider]  pantoprazole (PROTONIX) 40 MG tablet Take 40 mg by mouth daily. 10/10/19   [provider]  predniSONE (DELTASONE) 10 MG tablet Take 2 tablets (20 mg total) by mouth daily. Patient not taking: Reported on 03/02/2020 01/22/20   Emerson Monte, FNP  tiZANidine (ZANAFLEX) 2 MG tablet Take 2 mg by mouth every 8 (eight) hours as needed. Patient not taking: Reported on 03/02/2020 09/25/19   [provider]  valACYclovir (VALTREX) 1000 MG tablet Take 1 tablet (1,000 mg total) by mouth 3 (three) times daily. 02/11/19   Noemi Chapel, MD    Allergies    Patient has no known allergies.  Review of Systems   Review of Systems  Constitutional:  Negative for chills and fever.  HENT:  Negative for sore throat.   Eyes:  Negative for redness.  Respiratory:  Positive for cough, shortness of breath and wheezing.   Cardiovascular:  Negative for leg swelling.  Gastrointestinal:  Negative for abdominal  pain and vomiting.  Genitourinary:  Negative for flank pain.  Musculoskeletal:  Negative for back pain and neck pain.  Skin:  Negative for rash.  Neurological:  Negative for headaches.  Hematological:  Does not bruise/bleed easily.  Psychiatric/Behavioral:  Negative for confusion.    Physical Exam Updated Vital Signs BP 111/73   Pulse 70   Temp 98.4 F (36.9 C)   Resp 17   Ht 1.651 m (5\' 5" )   Wt 90.7 kg   SpO2 100%   BMI 33.28 kg/m   Physical Exam Vitals and nursing note reviewed.  Constitutional:      Appearance: Normal appearance. She is well-developed.  HENT:     Head: Atraumatic.     Nose: Nose normal.     Mouth/Throat:     Mouth: Mucous membranes are moist.  Eyes:     General: No scleral  icterus.    Conjunctiva/sclera: Conjunctivae normal.  Neck:     Trachea: No tracheal deviation.  Cardiovascular:     Rate and Rhythm: Normal rate and regular rhythm.     Pulses: Normal pulses.     Heart sounds: Normal heart sounds. No murmur heard.   No friction rub. No gallop.  Pulmonary:     Effort: Pulmonary effort is normal. No respiratory distress.     Breath sounds: Wheezing present.  Abdominal:     General: Bowel sounds are normal. There is no distension.     Palpations: Abdomen is soft.     Tenderness: There is no abdominal tenderness.  Genitourinary:    Comments: No cva tenderness.  Musculoskeletal:        General: No swelling or tenderness.     Cervical back: Normal range of motion and neck supple. No rigidity. No muscular tenderness.  Skin:    General: Skin is warm and dry.     Findings: No rash.  Neurological:     Mental Status: She is alert.     Comments: Alert, speech normal.   Psychiatric:        Mood and Affect: Mood normal.    ED Results / Procedures / Treatments   Labs (all labs ordered are listed, but only abnormal results are displayed) Results for orders placed or performed during the hospital encounter of 12/09/20  CBC  Result Value Ref  Range   WBC 8.8 4.0 - 10.5 K/uL   RBC 4.02 3.87 - 5.11 MIL/uL   Hemoglobin 9.7 (L) 12.0 - 15.0 g/dL   HCT 32.1 (L) 36.0 - 46.0 %   MCV 79.9 (L) 80.0 - 100.0 fL   MCH 24.1 (L) 26.0 - 34.0 pg   MCHC 30.2 30.0 - 36.0 g/dL   RDW 15.5 11.5 - 15.5 %   Platelets 272 150 - 400 K/uL   nRBC 0.0 0.0 - 0.2 %  Basic metabolic panel  Result Value Ref Range   Sodium 136 135 - 145 mmol/L   Potassium 3.5 3.5 - 5.1 mmol/L   Chloride 107 98 - 111 mmol/L   CO2 24 22 - 32 mmol/L   Glucose, Bld 107 (H) 70 - 99 mg/dL   BUN 15 6 - 20 mg/dL   Creatinine, Ser 0.83 0.44 - 1.00 mg/dL   Calcium 8.6 (L) 8.9 - 10.3 mg/dL   GFR, Estimated >60 >60 mL/min   Anion gap 5 5 - 15    EKG EKG Interpretation  Date/Time:  Thursday December 09 2020 21:35:21 EDT Ventricular Rate:  77 PR Interval:  138 QRS Duration: 102 QT Interval:  394 QTC Calculation: 446 R Axis:   59 Text Interpretation: Sinus rhythm `no acute st/t changes Confirmed by Lajean Saver [phone]) on 12/09/2020 9:46:08 PM  Radiology DG Chest Portable 1 View  Result Date: 12/09/2020 CLINICAL DATA:  Shortness of breath and chest pain. EXAM: PORTABLE CHEST 1 VIEW COMPARISON:  January 16, 2020 FINDINGS: Mild, stable chronic appearing increased interstitial lung markings are seen. Mild, stable atelectasis is noted within the bilateral lung bases. There is no evidence of a pleural effusion or pneumothorax. The heart size and mediastinal contours are within normal limits. The visualized skeletal structures are unremarkable. IMPRESSION: Mild, stable bibasilar atelectasis. Electronically Signed   By: Virgina Norfolk [name]   On: 12/09/2020 21:32    Procedures Procedures   Medications Ordered in ED Medications  albuterol (VENTOLIN HFA) 108 (90 Base) MCG/ACT inhaler 2 puff (has no  administration in time range)  ipratropium-albuterol (DUONEB) 0.5-2.5 (3) MG/3ML nebulizer solution 3 mL (has no administration in time range)  albuterol (PROVENTIL) (2.5 MG/3ML)  0.083% nebulizer solution 2.5 mg (has no administration in time range)  albuterol (PROVENTIL) (2.5 MG/3ML) 0.083% nebulizer solution 5 mg (5 mg Nebulization Given 12/09/20 2154)  ipratropium (ATROVENT) nebulizer solution 0.5 mg (0.5 mg Nebulization Given 12/09/20 2154)  methylPREDNISolone sodium succinate (SOLU-MEDROL) 125 mg/2 mL injection 125 mg (125 mg Intravenous Given 12/09/20 2135)    ED Course  I have reviewed the triage vital signs and the nursing notes.  Pertinent labs & imaging results that were available during my care of the patient were reviewed by me and considered in my medical decision making (see chart for details).    MDM Rules/Calculators/A&P                           Iv ns. Continuous pulse ox and cardiac monitoring. Stat labs. Imaging. Ecg.   Reviewed nursing notes and prior charts for additional history.   Albuterol and atrovent neb. Solumedrol iv.   Labs reviewed/interpreted by me - wbc normal.   CXR reviewed/interpreted by me - no pna.   Recheck persistent wheezing. Additional albuterol and atrovent tx.   Recheck, sl improved air movement but still tight, diffuse wheezing. Mg iv. Additional albuterol/atrovent tx.   Hospitalist consulted for admission.    Final Clinical Impression(s) / ED Diagnoses Final diagnoses:  None    Rx / DC Orders ED Discharge Orders     None        Lajean Saver, MD 12/09/20 2257

## 2020-12-09 NOTE — H&P (Addendum)
History and Physical  [place] HGD:[id number] DOB: April 10, 1975 DOA: 12/09/2020  PCP: Alvester Chou, NP  Patient coming from: Home Level of care: Telemetry  I have personally briefly reviewed patient's old medical records in [place]  Chief Complaint: COPD exacerbation  HPI: [name] is a 45 y.o. female with medical history significant for COPD, asthma, anxiety/depression and hypothyroidism who present presented to the emergency department with a 1 day history of shortness of breath, wheezing and sore throat.  Patient states that she has had sore throat for about a day.  She tried to use her inhaler with minimal improvement, prompting her to come to the emergency department.  Patient stopped smoking about 3 weeks ago.  Prior she smoked a pack a day for 34 years.  Patient denies fevers or recent sick contact.  At presentation to the ED, patient O2 saturation at 94% on room air.  Patient also endorses chest pain and cough with sputum that has not changed from her baseline.  EKG shows sinus rhythm, heart rate of 88 bpm, and QTc of 453.  At home patient she uses 3 L  oxygen nightly, but otherwise does not need oxygen during the day.  Patient has been vaccinated against SARS COVID-19, but has not received additional booster.  Patient was found to be COVID-19 negative.    ED Course: While in the ED, patient received methylprednisolone 125 mg IV and started on DuoNebs and albuterol breathing treatments.    Review of Systems: Review of Systems  Constitutional:  Negative for chills and fever.  HENT:  Positive for sore throat.   Eyes: Negative.   Respiratory:  Positive for cough, sputum production, shortness of breath and wheezing.   Cardiovascular:  Positive for chest pain.  Gastrointestinal: Negative.   Skin:  Negative for itching and rash.  Neurological: Negative.   Endo/Heme/Allergies: Negative.   Psychiatric/Behavioral: Negative.      Past  Medical History:  Diagnosis Date   Alcohol abuse    Anemia 01/31/2011   Asthma    COPD (chronic obstructive pulmonary disease) ([place])    Depression    Hypoxia 02/01/2011   Medical history non-contributory    Needs sleep apnea assessment 2013.07.31   Tobacco abuse     Past Surgical History:  Procedure Laterality Date   ANKLE GANGLION CYST EXCISION     BACK SURGERY     CESAREAN SECTION     COLONOSCOPY WITH PROPOFOL N/A 06/27/2016   Procedure: COLONOSCOPY WITH PROPOFOL;  Surgeon: Jonathon Bellows, MD;  Location: [facility] ENDOSCOPY;  Service: Endoscopy;  Laterality: N/A;   DILATION AND CURETTAGE OF UTERUS     ESOPHAGOGASTRODUODENOSCOPY (EGD) WITH PROPOFOL N/A 06/27/2016   Procedure: ESOPHAGOGASTRODUODENOSCOPY (EGD) WITH PROPOFOL;  Surgeon: Jonathon Bellows, MD;  Location[facility] ENDOSCOPY;  Service: Endoscopy;  Laterality: N/A;     reports that she has been smoking cigarettes. She has a 25.00 pack-year smoking history. She has never used smokeless tobacco. She reports current alcohol use. She reports that she does not use drugs.  No Known Allergies  Family History  Problem Relation Age of Onset   Hypothyroidism Mother    Coronary artery disease Mother    Throat cancer Mother    Diabetes Maternal Grandmother     Prior to Admission medications   Medication Sig Start Date End Date Taking? Authorizing Provider  albuterol (PROVENTIL) (2.5 MG/3ML) 0.083% nebulizer solution Take 3 mLs (2.5 mg total) by nebulization every 6 (six) hours as  needed for wheezing or shortness of breath. For asthma 12/07/16   Raiford Noble Latif, DO  albuterol (VENTOLIN HFA) 108 (90 Base) MCG/ACT inhaler Inhale 1-2 puffs into the lungs every 6 (six) hours as needed for wheezing or shortness of breath. 02/11/19   Noemi Chapel, MD  ALPRAZolam Duanne Moron) 0.25 MG tablet Take 0.25 mg by mouth 2 (two) times daily as needed. 10/09/19   [provider]  Budeson-Glycopyrrol-Formoterol (BREZTRI AEROSPHERE) 160-9-4.8 MCG/ACT AERO  Inhale 2 puffs into the lungs 2 (two) times daily. 02/19/20   Lauraine Rinne, NP  famotidine (PEPCID) 20 MG tablet One after supper 01/16/20   Tanda Rockers, MD  FLUoxetine (PROZAC) 40 MG capsule Take 40 mg by mouth daily. 08/05/16   [provider]  levothyroxine (SYNTHROID, LEVOTHROID) 137 MCG tablet Take 137 mcg by mouth daily. 11/22/16   [provider]  lidocaine (XYLOCAINE) 5 % ointment Apply 1 application topically 3 (three) times daily as needed. 09/19/19   Long, Wonda Olds, MD  meloxicam (MOBIC) 7.5 MG tablet Take 7.5 mg by mouth 2 (two) times daily.  09/23/19   [provider]  montelukast (SINGULAIR) 10 MG tablet Take 10 mg by mouth at bedtime.     [provider]  pantoprazole (PROTONIX) 40 MG tablet Take 40 mg by mouth daily. 10/10/19   [provider]  predniSONE (DELTASONE) 10 MG tablet Take 2 tablets (20 mg total) by mouth daily. Patient not taking: Reported on 03/02/2020 01/22/20   Emerson Monte, FNP  tiZANidine (ZANAFLEX) 2 MG tablet Take 2 mg by mouth every 8 (eight) hours as needed. Patient not taking: Reported on 03/02/2020 09/25/19   [provider]  valACYclovir (VALTREX) 1000 MG tablet Take 1 tablet (1,000 mg total) by mouth 3 (three) times daily. 02/11/19   Noemi Chapel, MD    Physical Exam: Vitals:   12/09/20 2156 12/09/20 2200 12/09/20 2230 12/09/20 2237  BP:  111/73 118/79   Pulse:  70 78   Resp:  17 15   Temp:      SpO2: 96% 100% 95% 96%  Weight:      Height:       Constitutional: Obese woman, laying in bed, in acute distress Eyes: PERRL, lids and conjunctivae normal ENMT: Mucous membranes are moist. Posterior pharynx clear of any exudate or lesions.Normal dentition.  Neck: normal, supple, no masses, no thyromegaly Respiratory: Expiratory wheeze present can be heard without stethoscope.  No crackles.  Increase respiratory effort. No accessory muscle use.  Cardiovascular: normal s1, s2 sounds, no murmurs / rubs  / gallops. No extremity edema. 2+ pedal pulses. No carotid bruits.  Abdomen: no tenderness, no masses palpated. No hepatosplenomegaly. Bowel sounds positive.  Musculoskeletal: no clubbing / cyanosis. No joint deformity upper and lower extremities. Good ROM, no contractures. Normal muscle tone.  Skin: no rashes, lesions, ulcers. No induration Neurologic: CN 2-12 grossly intact. Sensation intact, DTR normal. Strength 5/5 in all 4.  Psychiatric: Normal judgment and insight. Alert and oriented x 3.  Appropriate mood.     Labs on Admission: I have personally reviewed following labs and imaging studies  CBC: Recent Labs  Lab 12/09/20 2130  WBC 8.8  HGB 9.7*  HCT 32.1*  MCV 79.9*  PLT 338   Basic Metabolic Panel: Recent Labs  Lab 12/09/20 2130  NA 136  K 3.5  CL 107  CO2 24  GLUCOSE 107*  BUN 15  CREATININE 0.83  CALCIUM 8.6*   GFR: Estimated Creatinine  Clearance: 94.3 mL/min (by C-G formula based on SCr of 0.83 mg/dL). Liver Function Tests: No results for input(s): AST, ALT, ALKPHOS, BILITOT, PROT, ALBUMIN in the last 168 hours. No results for input(s): LIPASE, AMYLASE in the last 168 hours. No results for input(s): AMMONIA in the last 168 hours. Coagulation Profile: No results for input(s): INR, PROTIME in the last 168 hours. Cardiac Enzymes: No results for input(s): CKTOTAL, CKMB, CKMBINDEX, TROPONINI in the last 168 hours. BNP (last 3 results) No results for input(s): PROBNP in the last 8760 hours. HbA1C: No results for input(s): HGBA1C in the last 72 hours. CBG: No results for input(s): GLUCAP in the last 168 hours. Lipid Profile: No results for input(s): CHOL, HDL, LDLCALC, TRIG, CHOLHDL, LDLDIRECT in the last 72 hours. Thyroid Function Tests: No results for input(s): TSH, T4TOTAL, FREET4, T3FREE, THYROIDAB in the last 72 hours. Anemia Panel: No results for input(s): VITAMINB12, FOLATE, FERRITIN, TIBC, IRON, RETICCTPCT in the last 72 hours. Urine analysis:     Component Value Date/Time   COLORURINE YELLOW 09/19/2019 1014   APPEARANCEUR HAZY (A) 09/19/2019 1014   APPEARANCEUR Hazy 05/10/2013 1216   LABSPEC 1.010 09/19/2019 1014   LABSPEC 1.014 05/10/2013 1216   PHURINE 7.0 09/19/2019 1014   GLUCOSEU NEGATIVE 09/19/2019 1014   GLUCOSEU Negative 05/10/2013 1216   HGBUR NEGATIVE 09/19/2019 1014   BILIRUBINUR NEGATIVE 09/19/2019 1014   BILIRUBINUR Negative 05/10/2013 [place] 09/19/2019 1014   PROTEINUR NEGATIVE 09/19/2019 1014   UROBILINOGEN 0.2 09/10/2011 0250   NITRITE NEGATIVE 09/19/2019 1014   LEUKOCYTESUR TRACE (A) 09/19/2019 1014   LEUKOCYTESUR Negative 05/10/2013 1216    Radiological Exams on Admission: DG Chest Portable 1 View  Result Date: 12/09/2020 CLINICAL DATA:  Shortness of breath and chest pain. EXAM: PORTABLE CHEST 1 VIEW COMPARISON:  January 16, 2020 FINDINGS: Mild, stable chronic appearing increased interstitial lung markings are seen. Mild, stable atelectasis is noted within the bilateral lung bases. There is no evidence of a pleural effusion or pneumothorax. The heart size and mediastinal contours are within normal limits. The visualized skeletal structures are unremarkable. IMPRESSION: Mild, stable bibasilar atelectasis. Electronically Signed   By: Virgina Norfolk [name]   On: 12/09/2020 21:32    EKG: Independently reviewed.   Assessment/Plan Active Problems:   COPD exacerbation (HCC)     COPD exacerbation - ABG ordered - DuoNebs 0.5 - 2.5 mg q6 - PRN Albuterol every 2 hr -Patient already received methylprednisolone 125 mg while in ED.  Patient will receive an additional 125 mg in the morning.  Hypothyroidism -Continue home Synthroid 135 MCG  Anxiety/depression -Continue home Prozac 40 mg  Sore Throat -Chloraseptic spray  Tobacco use -As needed nicotine patch    DVT prophylaxis: Heparin /SCDs Code Status: Full code Family Communication: No Family at bedside Disposition Plan:  Home Consults called: N/A Admission status: Inpatient Level of care: Telemetry Valentino Nose Sweet MS4  12/09/2020, 11:50 PM

## 2020-12-09 NOTE — Discharge Instructions (Signed)
It was our pleasure to provide your ER care today - we hope that you feel better.  Take prednisone as prescribed.   Use albuterol treatment every 4 hours as need.   Follow up with your doctor/pulmonary doctor in 1 week if symptoms fail to improve/resolve.  Return to ER if worse, new symptoms, increased trouble breathing, recurrent/persistent chest pain, or other concern.

## 2020-12-10 ENCOUNTER — Other Ambulatory Visit: Payer: Self-pay

## 2020-12-10 ENCOUNTER — Encounter (HOSPITAL_COMMUNITY): Payer: Self-pay | Admitting: Family Medicine

## 2020-12-10 DIAGNOSIS — E039 Hypothyroidism, unspecified: Secondary | ICD-10-CM

## 2020-12-10 DIAGNOSIS — J441 Chronic obstructive pulmonary disease with (acute) exacerbation: Secondary | ICD-10-CM | POA: Diagnosis not present

## 2020-12-10 DIAGNOSIS — J9611 Chronic respiratory failure with hypoxia: Secondary | ICD-10-CM | POA: Diagnosis not present

## 2020-12-10 LAB — CBC WITH DIFFERENTIAL/PLATELET
Abs Immature Granulocytes: 0.04 K/uL (ref 0.00–0.07)
Basophils Absolute: 0 K/uL (ref 0.0–0.1)
Basophils Relative: 0 %
Eosinophils Absolute: 0 K/uL (ref 0.0–0.5)
Eosinophils Relative: 0 %
HCT: 32.3 % — ABNORMAL LOW (ref 36.0–46.0)
Hemoglobin: 9.4 g/dL — ABNORMAL LOW (ref 12.0–15.0)
Immature Granulocytes: 1 %
Lymphocytes Relative: 4 %
Lymphs Abs: 0.3 K/uL — ABNORMAL LOW (ref 0.7–4.0)
MCH: 23.2 pg — ABNORMAL LOW (ref 26.0–34.0)
MCHC: 29.1 g/dL — ABNORMAL LOW (ref 30.0–36.0)
MCV: 79.8 fL — ABNORMAL LOW (ref 80.0–100.0)
Monocytes Absolute: 0.1 K/uL (ref 0.1–1.0)
Monocytes Relative: 1 %
Neutro Abs: 7.6 K/uL (ref 1.7–7.7)
Neutrophils Relative %: 94 %
Platelets: 260 K/uL (ref 150–400)
RBC: 4.05 MIL/uL (ref 3.87–5.11)
RDW: 15.6 % — ABNORMAL HIGH (ref 11.5–15.5)
WBC: 8 K/uL (ref 4.0–10.5)
nRBC: 0 % (ref 0.0–0.2)

## 2020-12-10 LAB — COMPREHENSIVE METABOLIC PANEL WITH GFR
ALT: 17 U/L (ref 0–44)
AST: 23 U/L (ref 15–41)
Albumin: 3.8 g/dL (ref 3.5–5.0)
Alkaline Phosphatase: 53 U/L (ref 38–126)
Anion gap: 5 (ref 5–15)
BUN: 15 mg/dL (ref 6–20)
CO2: 25 mmol/L (ref 22–32)
Calcium: 8.6 mg/dL — ABNORMAL LOW (ref 8.9–10.3)
Chloride: 106 mmol/L (ref 98–111)
Creatinine, Ser: 0.74 mg/dL (ref 0.44–1.00)
GFR, Estimated: >60 mL/min
Glucose, Bld: 167 mg/dL — ABNORMAL HIGH (ref 70–99)
Potassium: 3.9 mmol/L (ref 3.5–5.1)
Sodium: 136 mmol/L (ref 135–145)
Total Bilirubin: 0.3 mg/dL (ref 0.3–1.2)
Total Protein: 7 g/dL (ref 6.5–8.1)

## 2020-12-10 LAB — BLOOD GAS, VENOUS
Acid-Base Excess: 0.3 mmol/L (ref 0.0–2.0)
Bicarbonate: 24.3 mmol/L (ref 20.0–28.0)
Drawn by: 6643
FIO2: 21
O2 Saturation: 77.4 %
Patient temperature: 36.9
pCO2, Ven: 40.3 mmHg — ABNORMAL LOW (ref 44.0–60.0)
pH, Ven: 7.401 (ref 7.250–7.430)
pO2, Ven: 46.9 mmHg — ABNORMAL HIGH (ref 32.0–45.0)

## 2020-12-10 LAB — HIV ANTIBODY (ROUTINE TESTING W REFLEX): HIV Screen 4th Generation wRfx: NONREACTIVE

## 2020-12-10 LAB — MAGNESIUM: Magnesium: 2.2 mg/dL (ref 1.7–2.4)

## 2020-12-10 MED ORDER — OXYCODONE HCL 5 MG PO TABS
5.0000 mg | ORAL_TABLET | ORAL | Status: DC | PRN
  Administered 2020-12-10 (×2): 5 mg via ORAL
  Filled 2020-12-10 (×2): qty 1

## 2020-12-10 MED ORDER — MELOXICAM 7.5 MG PO TABS
7.5000 mg | ORAL_TABLET | Freq: Two times a day (BID) | ORAL | Status: DC
  Administered 2020-12-10 – 2020-12-11 (×3): 7.5 mg via ORAL
  Filled 2020-12-10 (×3): qty 1

## 2020-12-10 MED ORDER — MONTELUKAST SODIUM 10 MG PO TABS
10.0000 mg | ORAL_TABLET | Freq: Every day | ORAL | Status: DC
  Administered 2020-12-10: 10 mg via ORAL
  Filled 2020-12-10: qty 1

## 2020-12-10 MED ORDER — HEPARIN SODIUM (PORCINE) 5000 UNIT/ML IJ SOLN
5000.0000 [IU] | Freq: Three times a day (TID) | INTRAMUSCULAR | Status: DC
  Administered 2020-12-10 – 2020-12-11 (×4): 5000 [IU] via SUBCUTANEOUS
  Filled 2020-12-10 (×4): qty 1

## 2020-12-10 MED ORDER — ONDANSETRON HCL 4 MG PO TABS: 4.0000 mg | ORAL_TABLET | Freq: Four times a day (QID) | ORAL | Status: DC | PRN

## 2020-12-10 MED ORDER — IPRATROPIUM-ALBUTEROL 0.5-2.5 (3) MG/3ML IN SOLN
3.0000 mL | Freq: Four times a day (QID) | RESPIRATORY_TRACT | Status: DC
  Administered 2020-12-10 (×4): 3 mL via RESPIRATORY_TRACT
  Filled 2020-12-10 (×4): qty 3

## 2020-12-10 MED ORDER — BUDESON-GLYCOPYRROL-FORMOTEROL 160-9-4.8 MCG/ACT IN AERO: 2.0000 | INHALATION_SPRAY | Freq: Two times a day (BID) | RESPIRATORY_TRACT | Status: DC

## 2020-12-10 MED ORDER — NICOTINE 14 MG/24HR TD PT24
14.0000 mg | MEDICATED_PATCH | Freq: Every day | TRANSDERMAL | Status: DC
  Administered 2020-12-10 – 2020-12-11 (×2): 14 mg via TRANSDERMAL
  Filled 2020-12-10 (×2): qty 1

## 2020-12-10 MED ORDER — AZITHROMYCIN 250 MG PO TABS
500.0000 mg | ORAL_TABLET | Freq: Every day | ORAL | Status: AC
  Administered 2020-12-10: 500 mg via ORAL
  Filled 2020-12-10: qty 2

## 2020-12-10 MED ORDER — ALBUTEROL SULFATE (2.5 MG/3ML) 0.083% IN NEBU: 2.5000 mg | INHALATION_SOLUTION | RESPIRATORY_TRACT | Status: DC | PRN

## 2020-12-10 MED ORDER — ACETAMINOPHEN 325 MG PO TABS
650.0000 mg | ORAL_TABLET | Freq: Four times a day (QID) | ORAL | Status: DC | PRN
  Administered 2020-12-10: 650 mg via ORAL
  Filled 2020-12-10: qty 2

## 2020-12-10 MED ORDER — FAMOTIDINE 20 MG PO TABS
20.0000 mg | ORAL_TABLET | Freq: Every day | ORAL | Status: DC
  Administered 2020-12-11: 20 mg via ORAL
  Filled 2020-12-10: qty 1

## 2020-12-10 MED ORDER — AZITHROMYCIN 250 MG PO TABS
250.0000 mg | ORAL_TABLET | Freq: Every day | ORAL | Status: DC
Start: 2020-12-11 — End: 2020-12-11
  Administered 2020-12-11: 250 mg via ORAL
  Filled 2020-12-10: qty 1

## 2020-12-10 MED ORDER — LEVOTHYROXINE SODIUM 137 MCG PO TABS
137.0000 ug | ORAL_TABLET | Freq: Every day | ORAL | Status: DC
  Administered 2020-12-10 – 2020-12-11 (×2): 137 ug via ORAL
  Filled 2020-12-10 (×2): qty 1

## 2020-12-10 MED ORDER — MOMETASONE FURO-FORMOTEROL FUM 100-5 MCG/ACT IN AERO
2.0000 | INHALATION_SPRAY | Freq: Two times a day (BID) | RESPIRATORY_TRACT | Status: DC
  Administered 2020-12-10 – 2020-12-11 (×3): 2 via RESPIRATORY_TRACT
  Filled 2020-12-10: qty 8.8

## 2020-12-10 MED ORDER — ONDANSETRON HCL 4 MG/2ML IJ SOLN: 4.0000 mg | Freq: Four times a day (QID) | INTRAMUSCULAR | Status: DC | PRN

## 2020-12-10 MED ORDER — PANTOPRAZOLE SODIUM 40 MG PO TBEC
40.0000 mg | DELAYED_RELEASE_TABLET | Freq: Every day | ORAL | Status: DC
  Administered 2020-12-10 – 2020-12-11 (×2): 40 mg via ORAL
  Filled 2020-12-10 (×2): qty 1

## 2020-12-10 MED ORDER — GUAIFENESIN-CODEINE 100-10 MG/5ML PO SOLN
5.0000 mL | Freq: Four times a day (QID) | ORAL | Status: DC | PRN
Start: 2020-12-10 — End: 2020-12-11

## 2020-12-10 MED ORDER — ACETAMINOPHEN 650 MG RE SUPP: 650.0000 mg | Freq: Four times a day (QID) | RECTAL | Status: DC | PRN

## 2020-12-10 MED ORDER — PREDNISONE 20 MG PO TABS
40.0000 mg | ORAL_TABLET | Freq: Every day | ORAL | Status: DC
  Administered 2020-12-11: 40 mg via ORAL
  Filled 2020-12-10: qty 2

## 2020-12-10 MED ORDER — UMECLIDINIUM BROMIDE 62.5 MCG/ACT IN AEPB
1.0000 | INHALATION_SPRAY | Freq: Every day | RESPIRATORY_TRACT | Status: DC
  Administered 2020-12-10 – 2020-12-11 (×2): 1 via RESPIRATORY_TRACT
  Filled 2020-12-10: qty 7

## 2020-12-10 MED ORDER — ALPRAZOLAM 0.25 MG PO TABS: 0.2500 mg | ORAL_TABLET | Freq: Two times a day (BID) | ORAL | Status: DC | PRN

## 2020-12-10 MED ORDER — METHYLPREDNISOLONE SODIUM SUCC 125 MG IJ SOLR
125.0000 mg | Freq: Every day | INTRAMUSCULAR | Status: AC
  Administered 2020-12-10: 125 mg via INTRAVENOUS
  Filled 2020-12-10: qty 2

## 2020-12-10 MED ORDER — IPRATROPIUM-ALBUTEROL 0.5-2.5 (3) MG/3ML IN SOLN
3.0000 mL | Freq: Three times a day (TID) | RESPIRATORY_TRACT | Status: DC
  Administered 2020-12-11 (×2): 3 mL via RESPIRATORY_TRACT
  Filled 2020-12-10 (×2): qty 3

## 2020-12-10 MED ORDER — FLUOXETINE HCL 20 MG PO CAPS
40.0000 mg | ORAL_CAPSULE | Freq: Every day | ORAL | Status: DC
  Administered 2020-12-10 – 2020-12-11 (×2): 40 mg via ORAL
  Filled 2020-12-10 (×2): qty 2

## 2020-12-10 NOTE — ED Notes (Signed)
Ambulatory to restroom unassisted.

## 2020-12-10 NOTE — Progress Notes (Signed)
PROGRESS NOTE    [name]  CBJ:[id number] DOB: 1975/03/21 DOA: 12/09/2020 PCP: Alvester Chou, NP    Brief Narrative:  45 year old female with a history of COPD, uses oxygen at night and as needed during the day, presents to the emergency room with shortness of breath.  Found to have COPD exacerbation.  Admitted for IV steroids and bronchodilators.   Assessment & Plan:   Active Problems:   COPD exacerbation (HCC)   COPD exacerbation -Continues to wheeze and short of breath -Continue Solu-Medrol -Continue bronchodilators -Started course of azithromycin -Encourage incentive spirometry  Chronic respiratory failure with hypoxia -He uses oxygen at night and as needed during the day -Suspect that she may need to wear it 24 hours on discharge until she further recovers  Hypothyroidism -Continue on Synthroid  Anxiety/depression -Continue on Prozac   DVT prophylaxis: heparin injection 5,000 Units Start: 12/10/20 0100 SCDs Start: 12/10/20 0057  Code Status: Full code Family Communication: Discussed with patient Disposition Plan: Status is: Inpatient  Remains inpatient appropriate because: Ongoing shortness of breath and wheezing needing IV steroids         Consultants:    Procedures:    Antimicrobials:  Azithromycin 10/28 >   Subjective: Still feels short of breath and wheezing.  Continues to cough.  Objective: Vitals:   12/10/20 0923 12/10/20 1225 12/10/20 1428 12/10/20 1616  BP:  106/72  103/67  Pulse:  89  100  Resp:  17  20  Temp:  98.5 F (36.9 C)  99.2 F (37.3 C)  TempSrc:    Oral  SpO2: 97% 98% 97% 94%  Weight:      Height:        Intake/Output Summary (Last 24 hours) at 12/10/2020 2009 Last data filed at 12/10/2020 1800 Gross per 24 hour  Intake 720 ml  Output --  Net 720 ml   Filed Weights   12/09/20 2105 12/10/20 0920  Weight: 90.7 kg 92.2 kg    Examination:  General exam: Appears calm and comfortable  Respiratory  system: Clear to auscultation. Respiratory effort normal. Cardiovascular system: S1 & S2 heard, RRR. No JVD, murmurs, rubs, gallops or clicks. No pedal edema. Gastrointestinal system: Abdomen is nondistended, soft and nontender. No organomegaly or masses felt. Normal bowel sounds heard. Central nervous system: Alert and oriented. No focal neurological deficits. Extremities: Symmetric 5 x 5 power. Skin: No rashes, lesions or ulcers Psychiatry: Judgement and insight appear normal. Mood & affect appropriate.     Data Reviewed: I have personally reviewed following labs and imaging studies  CBC: Recent Labs  Lab 12/09/20 2130 12/10/20 0314  WBC 8.8 8.0  NEUTROABS  --  7.6  HGB 9.7* 9.4*  HCT 32.1* 32.3*  MCV 79.9* 79.8*  PLT 272 160   Basic Metabolic Panel: Recent Labs  Lab 12/09/20 2130 12/10/20 0314  NA 136 136  K 3.5 3.9  CL 107 106  CO2 24 25  GLUCOSE 107* 167*  BUN 15 15  CREATININE 0.83 0.74  CALCIUM 8.6* 8.6*  MG  --  2.2   GFR: Estimated Creatinine Clearance: 98.6 mL/min (by C-G formula based on SCr of 0.74 mg/dL). Liver Function Tests: Recent Labs  Lab 12/10/20 0314  AST 23  ALT 17  ALKPHOS 53  BILITOT 0.3  PROT 7.0  ALBUMIN 3.8   No results for input(s): LIPASE, AMYLASE in the last 168 hours. No results for input(s): AMMONIA in the last 168 hours. Coagulation Profile: No results for input(s): INR, PROTIME  in the last 168 hours. Cardiac Enzymes: No results for input(s): CKTOTAL, CKMB, CKMBINDEX, TROPONINI in the last 168 hours. BNP (last 3 results) No results for input(s): PROBNP in the last 8760 hours. HbA1C: No results for input(s): HGBA1C in the last 72 hours. CBG: No results for input(s): GLUCAP in the last 168 hours. Lipid Profile: No results for input(s): CHOL, HDL, LDLCALC, TRIG, CHOLHDL, LDLDIRECT in the last 72 hours. Thyroid Function Tests: No results for input(s): TSH, T4TOTAL, FREET4, T3FREE, THYROIDAB in the last 72 hours. Anemia  Panel: No results for input(s): VITAMINB12, FOLATE, FERRITIN, TIBC, IRON, RETICCTPCT in the last 72 hours. Sepsis Labs: No results for input(s): PROCALCITON, LATICACIDVEN in the last 168 hours.  Recent Results (from the past 240 hour(s))  Resp Panel by RT-PCR (Flu A&B, Covid) Nasopharyngeal Swab     Status: None   Collection Time: 12/09/20  9:30 PM   Specimen: Nasopharyngeal Swab; Nasopharyngeal(NP) swabs in vial transport medium  Result Value Ref Range Status   SARS Coronavirus 2 by RT PCR NEGATIVE NEGATIVE Final    Comment: (NOTE) SARS-CoV-2 target nucleic acids are NOT DETECTED.  The SARS-CoV-2 RNA is generally detectable in upper respiratory specimens during the acute phase of infection. The lowest concentration of SARS-CoV-2 viral copies this assay can detect is 138 copies/mL. A negative result does not preclude SARS-Cov-2 infection and should not be used as the sole basis for treatment or other patient management decisions. A negative result may occur with  improper specimen collection/handling, submission of specimen other than nasopharyngeal swab, presence of viral mutation(s) within the areas targeted by this assay, and inadequate number of viral copies(<138 copies/mL). A negative result must be combined with clinical observations, patient history, and epidemiological information. The expected result is Negative.  Fact Sheet for Patients:  EntrepreneurPulse.com.au  Fact Sheet for Healthcare Providers:  IncredibleEmployment.be  This test is no t yet approved or cleared by the [place] FDA and  has been authorized for detection and/or diagnosis of SARS-CoV-2 by FDA under an Emergency Use Authorization (EUA). This EUA will remain  in effect (meaning this test can be used) for the duration of the COVID-19 declaration under Section 564(b)(1) of the Act, 21 U.S.C.section 360bbb-3(b)(1), unless the authorization is terminated  or  revoked sooner.       Influenza A by PCR NEGATIVE NEGATIVE Final   Influenza B by PCR NEGATIVE NEGATIVE Final    Comment: (NOTE) The Xpert Xpress SARS-CoV-2/FLU/RSV plus assay is intended as an aid in the diagnosis of influenza from Nasopharyngeal swab specimens and should not be used as a sole basis for treatment. Nasal washings and aspirates are unacceptable for Xpert Xpress SARS-CoV-2/FLU/RSV testing.  Fact Sheet for Patients: EntrepreneurPulse.com.au  Fact Sheet for Healthcare Providers: IncredibleEmployment.be  This test is not yet approved or cleared by the [place] FDA and has been authorized for detection and/or diagnosis of SARS-CoV-2 by FDA under an Emergency Use Authorization (EUA). This EUA will remain in effect (meaning this test can be used) for the duration of the COVID-19 declaration under Section 564(b)(1) of the Act, 21 U.S.C. section 360bbb-3(b)(1), unless the authorization is terminated or revoked.  Performed at [facility], [address], [place], [place] [address]          Radiology Studies: DG Chest Portable 1 View  Result Date: 12/09/2020 CLINICAL DATA:  Shortness of breath and chest pain. EXAM: PORTABLE CHEST 1 VIEW COMPARISON:  January 16, 2020 FINDINGS: Mild, stable chronic appearing increased interstitial lung markings  are seen. Mild, stable atelectasis is noted within the bilateral lung bases. There is no evidence of a pleural effusion or pneumothorax. The heart size and mediastinal contours are within normal limits. The visualized skeletal structures are unremarkable. IMPRESSION: Mild, stable bibasilar atelectasis. Electronically Signed   By: Virgina Norfolk [name]   On: 12/09/2020 21:32        Scheduled Meds:  famotidine  20 mg Oral Daily   FLUoxetine  40 mg Oral Daily   heparin  5,000 Units Subcutaneous Q8H   ipratropium-albuterol  3 mL Nebulization Q6H   levothyroxine  137 mcg Oral Daily    meloxicam  7.5 mg Oral BID   mometasone-formoterol  2 puff Inhalation BID   montelukast  10 mg Oral QHS   nicotine  14 mg Transdermal Daily   pantoprazole  40 mg Oral Daily   [START ON 12/11/2020] predniSONE  40 mg Oral Q breakfast   umeclidinium bromide  1 puff Inhalation Daily   Continuous Infusions:   LOS: 1 day    Time spent: 78mins    Kathie Dike, MD [facility]   If 7PM-7AM, please contact night-coverage [email or web address]  12/10/2020, 8:09 PM

## 2020-12-10 NOTE — Progress Notes (Signed)
Gave patient Chiropodist.  Explained how to use.  Patient was able to do X10 with good patient effort reaching 2171ml.  Left at bedside for patient usage.

## 2020-12-10 NOTE — Plan of Care (Signed)

## 2020-12-11 DIAGNOSIS — J441 Chronic obstructive pulmonary disease with (acute) exacerbation: Secondary | ICD-10-CM | POA: Diagnosis not present

## 2020-12-11 DIAGNOSIS — E039 Hypothyroidism, unspecified: Secondary | ICD-10-CM | POA: Diagnosis not present

## 2020-12-11 DIAGNOSIS — J9611 Chronic respiratory failure with hypoxia: Secondary | ICD-10-CM | POA: Diagnosis not present

## 2020-12-11 MED ORDER — ALBUTEROL SULFATE (2.5 MG/3ML) 0.083% IN NEBU: 2.5000 mg | INHALATION_SOLUTION | Freq: Four times a day (QID) | RESPIRATORY_TRACT | 1 refills | Status: DC | PRN

## 2020-12-11 MED ORDER — BREZTRI AEROSPHERE 160-9-4.8 MCG/ACT IN AERO: 2.0000 | INHALATION_SPRAY | Freq: Two times a day (BID) | RESPIRATORY_TRACT | 11 refills | Status: DC

## 2020-12-11 MED ORDER — AZITHROMYCIN 250 MG PO TABS: 250.0000 mg | ORAL_TABLET | Freq: Every day | ORAL | 0 refills | Status: DC

## 2020-12-11 MED ORDER — MONTELUKAST SODIUM 10 MG PO TABS: 10.0000 mg | ORAL_TABLET | Freq: Every day | ORAL | 0 refills | Status: DC

## 2020-12-11 MED ORDER — PREDNISONE 10 MG PO TABS: ORAL_TABLET | ORAL | 0 refills | Status: DC

## 2020-12-11 MED ORDER — BENZONATATE 100 MG PO CAPS: 100.0000 mg | ORAL_CAPSULE | Freq: Three times a day (TID) | ORAL | 0 refills | Status: DC | PRN

## 2020-12-11 NOTE — Discharge Summary (Signed)
Physician Discharge Summary  BLONDELL LAPERLE STM:[id number] DOB: 03-12-1975 DOA: 12/09/2020  PCP: Alvester Chou, NP  Admit date: 12/09/2020 Discharge date: 12/11/2020  Admitted From: Home Disposition: Home  Recommendations for Outpatient Follow-up:  Follow up with PCP in 1-2 weeks Please obtain BMP/CBC in one week She has seen pulmonology in the past, will refer back to them  Home Health: Equipment/Devices:  Discharge Condition: Stable CODE STATUS: Full code Diet recommendation: Heart healthy  Brief/Interim Summary: 45 year old female with a history of COPD, uses oxygen at night and as needed during the day, presents to the emergency room with shortness of breath.  Found to have COPD exacerbation.  Admitted for IV steroids and bronchodilators.  Discharge Diagnoses:  Active Problems:   COPD exacerbation (HCC)  COPD exacerbation -Overall shortness of breath and wheezing have improved -Treated with Solu-Medrol, will transition to prednisone taper -Continue bronchodilators -Started course of azithromycin -Encourage incentive spirometry   Chronic respiratory failure with hypoxia -She uses oxygen at night and as needed during the day -Currently, she been weaned off of oxygen during the day, able to ambulate on room air without difficulty.   Hypothyroidism -Continue on Synthroid   Anxiety/depression -Continue on Prozac  Discharge Instructions  Discharge Instructions     Ambulatory referral to Pulmonology   Complete by: As directed    Follow up appointment   Reason for referral: Asthma/COPD   Diet - low sodium heart healthy   Complete by: As directed    Increase activity slowly   Complete by: As directed       Allergies as of 12/11/2020   No Known Allergies      Medication List     STOP taking these medications    valACYclovir 1000 MG tablet Commonly known as: VALTREX       TAKE these medications    albuterol 108 (90 Base) MCG/ACT  inhaler Commonly known as: VENTOLIN HFA Inhale 1-2 puffs into the lungs every 6 (six) hours as needed for wheezing or shortness of breath.   albuterol (2.5 MG/3ML) 0.083% nebulizer solution Commonly known as: PROVENTIL Take 3 mLs (2.5 mg total) by nebulization every 6 (six) hours as needed for wheezing or shortness of breath. For asthma   ALPRAZolam 0.25 MG tablet Commonly known as: XANAX Take 0.25 mg by mouth 2 (two) times daily as needed for anxiety or sleep.   azithromycin 250 MG tablet Commonly known as: ZITHROMAX Take 1 tablet (250 mg total) by mouth daily.   benzonatate 100 MG capsule Commonly known as: Tessalon Perles Take 1 capsule (100 mg total) by mouth 3 (three) times daily as needed for cough.   Breztri Aerosphere 160-9-4.8 MCG/ACT Aero Generic drug: Budeson-Glycopyrrol-Formoterol Inhale 2 puffs into the lungs 2 (two) times daily.   famotidine 20 MG tablet Commonly known as: Pepcid One after supper   FLUoxetine 40 MG capsule Commonly known as: PROZAC Take 40 mg by mouth daily.   gabapentin 300 MG capsule Commonly known as: NEURONTIN Take 300 mg by mouth 4 (four) times daily as needed (pain).   levothyroxine 137 MCG tablet Commonly known as: SYNTHROID Take 137 mcg by mouth daily.   lidocaine 5 % ointment Commonly known as: XYLOCAINE Apply 1 application topically 3 (three) times daily as needed.   meloxicam 7.5 MG tablet Commonly known as: MOBIC Take 7.5 mg by mouth 2 (two) times daily.   montelukast 10 MG tablet Commonly known as: SINGULAIR Take 1 tablet (10 mg total) by mouth at bedtime.  pantoprazole 40 MG tablet Commonly known as: PROTONIX Take 40 mg by mouth daily.   predniSONE 10 MG tablet Commonly known as: DELTASONE Take 40mg  po daily for 2 days then 30mg  daily for 2 days then 20mg  daily for 2 days then 10mg  daily for 2 days then stop What changed:  how much to take how to take this when to take this additional instructions         Follow-up Information     [facility] In 1 week.   Specialty: Pulmonology Contact information: [address], [address] [place] [address] [phone]               No Known Allergies  Consultations:    Procedures/Studies: DG Chest Portable 1 View  Result Date: 12/09/2020 CLINICAL DATA:  Shortness of breath and chest pain. EXAM: PORTABLE CHEST 1 VIEW COMPARISON:  January 16, 2020 FINDINGS: Mild, stable chronic appearing increased interstitial lung markings are seen. Mild, stable atelectasis is noted within the bilateral lung bases. There is no evidence of a pleural effusion or pneumothorax. The heart size and mediastinal contours are within normal limits. The visualized skeletal structures are unremarkable. IMPRESSION: Mild, stable bibasilar atelectasis. Electronically Signed   By: Virgina Norfolk [name]   On: 12/09/2020 21:32      Subjective: Feels better, able to ambulate on room air without difficulty.  Discharge Exam: Vitals:   12/11/20 0726 12/11/20 0727 12/11/20 1324 12/11/20 1421  BP:    108/70  Pulse:    80  Resp:    16  Temp:    97.6 F (36.4 C)  TempSrc:    Oral  SpO2: 97% 97% 96% 96%  Weight:      Height:        General: Pt is alert, awake, not in acute distress Cardiovascular: RRR, S1/S2 +, no rubs, no gallops Respiratory: CTA bilaterally, no wheezing, no rhonchi Abdominal: Soft, NT, ND, bowel sounds + Extremities: no edema, no cyanosis    The results of significant diagnostics from this hospitalization (including imaging, microbiology, ancillary and laboratory) are listed below for reference.     Microbiology: Recent Results (from the past 240 hour(s))  Resp Panel by RT-PCR (Flu A&B, Covid) Nasopharyngeal Swab     Status: None   Collection Time: 12/09/20  9:30 PM   Specimen: Nasopharyngeal Swab; Nasopharyngeal(NP) swabs in vial transport medium  Result Value Ref Range Status   SARS Coronavirus 2 by RT PCR  NEGATIVE NEGATIVE Final    Comment: (NOTE) SARS-CoV-2 target nucleic acids are NOT DETECTED.  The SARS-CoV-2 RNA is generally detectable in upper respiratory specimens during the acute phase of infection. The lowest concentration of SARS-CoV-2 viral copies this assay can detect is 138 copies/mL. A negative result does not preclude SARS-Cov-2 infection and should not be used as the sole basis for treatment or other patient management decisions. A negative result may occur with  improper specimen collection/handling, submission of specimen other than nasopharyngeal swab, presence of viral mutation(s) within the areas targeted by this assay, and inadequate number of viral copies(<138 copies/mL). A negative result must be combined with clinical observations, patient history, and epidemiological information. The expected result is Negative.  Fact Sheet for Patients:  EntrepreneurPulse.com.au  Fact Sheet for Healthcare Providers:  IncredibleEmployment.be  This test is no t yet approved or cleared by the [place] FDA and  has been authorized for detection and/or diagnosis of SARS-CoV-2 by FDA under an Emergency Use Authorization (EUA). This  EUA will remain  in effect (meaning this test can be used) for the duration of the COVID-19 declaration under Section 564(b)(1) of the Act, 21 U.S.C.section 360bbb-3(b)(1), unless the authorization is terminated  or revoked sooner.       Influenza A by PCR NEGATIVE NEGATIVE Final   Influenza B by PCR NEGATIVE NEGATIVE Final    Comment: (NOTE) The Xpert Xpress SARS-CoV-2/FLU/RSV plus assay is intended as an aid in the diagnosis of influenza from Nasopharyngeal swab specimens and should not be used as a sole basis for treatment. Nasal washings and aspirates are unacceptable for Xpert Xpress SARS-CoV-2/FLU/RSV testing.  Fact Sheet for Patients: EntrepreneurPulse.com.au  Fact Sheet for  Healthcare Providers: IncredibleEmployment.be  This test is not yet approved or cleared by the [place] FDA and has been authorized for detection and/or diagnosis of SARS-CoV-2 by FDA under an Emergency Use Authorization (EUA). This EUA will remain in effect (meaning this test can be used) for the duration of the COVID-19 declaration under Section 564(b)(1) of the Act, 21 U.S.C. section 360bbb-3(b)(1), unless the authorization is terminated or revoked.  Performed at [facility], [address] [place], [place] [address]      Labs: BNP (last 3 results) No results for input(s): BNP in the last 8760 hours. Basic Metabolic Panel: Recent Labs  Lab 12/09/20 2130 12/10/20 0314  NA 136 136  K 3.5 3.9  CL 107 106  CO2 24 25  GLUCOSE 107* 167*  BUN 15 15  CREATININE 0.83 0.74  CALCIUM 8.6* 8.6*  MG  --  2.2   Liver Function Tests: Recent Labs  Lab 12/10/20 0314  AST 23  ALT 17  ALKPHOS 53  BILITOT 0.3  PROT 7.0  ALBUMIN 3.8   No results for input(s): LIPASE, AMYLASE in the last 168 hours. No results for input(s): AMMONIA in the last 168 hours. CBC: Recent Labs  Lab 12/09/20 2130 12/10/20 0314  WBC 8.8 8.0  NEUTROABS  --  7.6  HGB 9.7* 9.4*  HCT 32.1* 32.3*  MCV 79.9* 79.8*  PLT 272 260   Cardiac Enzymes: No results for input(s): CKTOTAL, CKMB, CKMBINDEX, TROPONINI in the last 168 hours. BNP: Invalid input(s): POCBNP CBG: No results for input(s): GLUCAP in the last 168 hours. D-Dimer No results for input(s): DDIMER in the last 72 hours. Hgb A1c No results for input(s): HGBA1C in the last 72 hours. Lipid Profile No results for input(s): CHOL, HDL, LDLCALC, TRIG, CHOLHDL, LDLDIRECT in the last 72 hours. Thyroid function studies No results for input(s): TSH, T4TOTAL, T3FREE, THYROIDAB in the last 72 hours.  Invalid input(s): FREET3 Anemia work up No results for input(s): VITAMINB12, FOLATE, FERRITIN, TIBC, IRON, RETICCTPCT in the  last 72 hours. Urinalysis    Component Value Date/Time   COLORURINE YELLOW 09/19/2019 1014   APPEARANCEUR HAZY (A) 09/19/2019 1014   APPEARANCEUR Hazy 05/10/2013 1216   LABSPEC 1.010 09/19/2019 1014   LABSPEC 1.014 05/10/2013 1216   PHURINE 7.0 09/19/2019 1014   GLUCOSEU NEGATIVE 09/19/2019 1014   GLUCOSEU Negative 05/10/2013 1216   HGBUR NEGATIVE 09/19/2019 1014   BILIRUBINUR NEGATIVE 09/19/2019 1014   BILIRUBINUR Negative 05/10/2013 [place] 09/19/2019 1014   PROTEINUR NEGATIVE 09/19/2019 1014   UROBILINOGEN 0.2 09/10/2011 0250   NITRITE NEGATIVE 09/19/2019 1014   LEUKOCYTESUR TRACE (A) 09/19/2019 1014   LEUKOCYTESUR Negative 05/10/2013 1216   Sepsis Labs Invalid input(s): PROCALCITONIN,  WBC,  LACTICIDVEN Microbiology Recent Results (from the past 240 hour(s))  Resp Panel  by RT-PCR (Flu A&B, Covid) Nasopharyngeal Swab     Status: None   Collection Time: 12/09/20  9:30 PM   Specimen: Nasopharyngeal Swab; Nasopharyngeal(NP) swabs in vial transport medium  Result Value Ref Range Status   SARS Coronavirus 2 by RT PCR NEGATIVE NEGATIVE Final    Comment: (NOTE) SARS-CoV-2 target nucleic acids are NOT DETECTED.  The SARS-CoV-2 RNA is generally detectable in upper respiratory specimens during the acute phase of infection. The lowest concentration of SARS-CoV-2 viral copies this assay can detect is 138 copies/mL. A negative result does not preclude SARS-Cov-2 infection and should not be used as the sole basis for treatment or other patient management decisions. A negative result may occur with  improper specimen collection/handling, submission of specimen other than nasopharyngeal swab, presence of viral mutation(s) within the areas targeted by this assay, and inadequate number of viral copies(<138 copies/mL). A negative result must be combined with clinical observations, patient history, and epidemiological information. The expected result is Negative.  Fact  Sheet for Patients:  EntrepreneurPulse.com.au  Fact Sheet for Healthcare Providers:  IncredibleEmployment.be  This test is no t yet approved or cleared by the [place] FDA and  has been authorized for detection and/or diagnosis of SARS-CoV-2 by FDA under an Emergency Use Authorization (EUA). This EUA will remain  in effect (meaning this test can be used) for the duration of the COVID-19 declaration under Section 564(b)(1) of the Act, 21 U.S.C.section 360bbb-3(b)(1), unless the authorization is terminated  or revoked sooner.       Influenza A by PCR NEGATIVE NEGATIVE Final   Influenza B by PCR NEGATIVE NEGATIVE Final    Comment: (NOTE) The Xpert Xpress SARS-CoV-2/FLU/RSV plus assay is intended as an aid in the diagnosis of influenza from Nasopharyngeal swab specimens and should not be used as a sole basis for treatment. Nasal washings and aspirates are unacceptable for Xpert Xpress SARS-CoV-2/FLU/RSV testing.  Fact Sheet for Patients: EntrepreneurPulse.com.au  Fact Sheet for Healthcare Providers: IncredibleEmployment.be  This test is not yet approved or cleared by the [place] FDA and has been authorized for detection and/or diagnosis of SARS-CoV-2 by FDA under an Emergency Use Authorization (EUA). This EUA will remain in effect (meaning this test can be used) for the duration of the COVID-19 declaration under Section 564(b)(1) of the Act, 21 U.S.C. section 360bbb-3(b)(1), unless the authorization is terminated or revoked.  Performed at [facility], [address] [place], [place] [address]      Time coordinating discharge: 77mins  SIGNED:   Kathie Dike, MD  [facility] 12/11/2020, 8:17 PM   If 7PM-7AM, please contact night-coverage [email or web address]

## 2021-01-15 ENCOUNTER — Emergency Department (HOSPITAL_COMMUNITY): Payer: Self-pay

## 2021-01-15 ENCOUNTER — Emergency Department (HOSPITAL_COMMUNITY)
Admission: EM | Admit: 2021-01-15 | Discharge: 2021-01-15 | Disposition: A | Discharge status: Home / Self Care | Payer: Self-pay | Attending: Emergency Medicine | Admitting: Emergency Medicine

## 2021-01-15 DIAGNOSIS — J45909 Unspecified asthma, uncomplicated: Secondary | ICD-10-CM | POA: Insufficient documentation

## 2021-01-15 DIAGNOSIS — J441 Chronic obstructive pulmonary disease with (acute) exacerbation: Secondary | ICD-10-CM | POA: Diagnosis not present

## 2021-01-15 DIAGNOSIS — Z79899 Other long term (current) drug therapy: Secondary | ICD-10-CM | POA: Insufficient documentation

## 2021-01-15 DIAGNOSIS — Z7951 Long term (current) use of inhaled steroids: Secondary | ICD-10-CM | POA: Insufficient documentation

## 2021-01-15 DIAGNOSIS — E039 Hypothyroidism, unspecified: Secondary | ICD-10-CM | POA: Insufficient documentation

## 2021-01-15 DIAGNOSIS — R000 Tachycardia, unspecified: Secondary | ICD-10-CM | POA: Diagnosis not present

## 2021-01-15 DIAGNOSIS — F17210 Nicotine dependence, cigarettes, uncomplicated: Secondary | ICD-10-CM | POA: Insufficient documentation

## 2021-01-15 LAB — COMPREHENSIVE METABOLIC PANEL
ALT: 18 U/L (ref 0–44)
AST: 24 U/L (ref 15–41)
Albumin: 4.1 g/dL (ref 3.5–5.0)
Alkaline Phosphatase: 57 U/L (ref 38–126)
Anion gap: 9 (ref 5–15)
BUN: 12 mg/dL (ref 6–20)
CO2: 22 mmol/L (ref 22–32)
Calcium: 9.1 mg/dL (ref 8.9–10.3)
Chloride: 108 mmol/L (ref 98–111)
Creatinine, Ser: 0.72 mg/dL (ref 0.44–1.00)
GFR, Estimated: >60 mL/min (ref >60)
Glucose, Bld: 94 mg/dL (ref 70–99)
Potassium: 3.7 mmol/L (ref 3.5–5.1)
Sodium: 139 mmol/L (ref 135–145)
Total Bilirubin: 0.5 mg/dL (ref 0.3–1.2)
Total Protein: 7 g/dL (ref 6.5–8.1)

## 2021-01-15 LAB — CBC WITH DIFFERENTIAL/PLATELET
Abs Immature Granulocytes: 0.03 10*3/uL (ref 0.00–0.07)
Basophils Absolute: 0.1 10*3/uL (ref 0.0–0.1)
Basophils Relative: 1 %
Eosinophils Absolute: 0.7 10*3/uL — ABNORMAL HIGH (ref 0.0–0.5)
Eosinophils Relative: 7 %
HCT: 31.6 % — ABNORMAL LOW (ref 36.0–46.0)
Hemoglobin: 9.4 g/dL — ABNORMAL LOW (ref 12.0–15.0)
Immature Granulocytes: 0 %
Lymphocytes Relative: 39 %
Lymphs Abs: 3.8 10*3/uL (ref 0.7–4.0)
MCH: 23.4 pg — ABNORMAL LOW (ref 26.0–34.0)
MCHC: 29.7 g/dL — ABNORMAL LOW (ref 30.0–36.0)
MCV: 78.8 fL — ABNORMAL LOW (ref 80.0–100.0)
Monocytes Absolute: 0.7 10*3/uL (ref 0.1–1.0)
Monocytes Relative: 8 %
Neutro Abs: 4.3 10*3/uL (ref 1.7–7.7)
Neutrophils Relative %: 45 %
Platelets: 369 10*3/uL (ref 150–400)
RBC: 4.01 MIL/uL (ref 3.87–5.11)
RDW: 15.9 % — ABNORMAL HIGH (ref 11.5–15.5)
WBC: 9.6 10*3/uL (ref 4.0–10.5)
nRBC: 0 % (ref 0.0–0.2)

## 2021-01-15 LAB — TROPONIN I (HIGH SENSITIVITY): Troponin I (High Sensitivity): 3 ng/L (ref <18)

## 2021-01-15 MED ORDER — IPRATROPIUM BROMIDE 0.02 % IN SOLN
RESPIRATORY_TRACT | Status: AC
  Administered 2021-01-15: 0.5 mg
  Filled 2021-01-15: qty 2.5

## 2021-01-15 MED ORDER — IPRATROPIUM-ALBUTEROL 0.5-2.5 (3) MG/3ML IN SOLN
3.0000 mL | Freq: Once | RESPIRATORY_TRACT | Status: AC
  Administered 2021-01-15: 3 mL via RESPIRATORY_TRACT
  Filled 2021-01-15: qty 3

## 2021-01-15 MED ORDER — MAGNESIUM SULFATE 2 GM/50ML IV SOLN
2.0000 g | Freq: Once | INTRAVENOUS | Status: AC
  Administered 2021-01-15: 2 g via INTRAVENOUS
  Filled 2021-01-15: qty 50

## 2021-01-15 MED ORDER — PREDNISONE 10 MG PO TABS
20.0000 mg | ORAL_TABLET | Freq: Every day | ORAL | 0 refills | Status: DC
Start: 2021-01-15 — End: 2021-02-18

## 2021-01-15 MED ORDER — ALBUTEROL SULFATE (2.5 MG/3ML) 0.083% IN NEBU: 3.0000 mL | INHALATION_SOLUTION | RESPIRATORY_TRACT | Status: DC | PRN

## 2021-01-15 MED ORDER — ALBUTEROL SULFATE (2.5 MG/3ML) 0.083% IN NEBU
2.5000 mg | INHALATION_SOLUTION | Freq: Once | RESPIRATORY_TRACT | Status: AC
  Administered 2021-01-15: 2.5 mg via RESPIRATORY_TRACT
  Filled 2021-01-15: qty 3

## 2021-01-15 MED ORDER — MORPHINE SULFATE (PF) 2 MG/ML IV SOLN
2.0000 mg | Freq: Once | INTRAVENOUS | Status: AC
  Administered 2021-01-15: 2 mg via INTRAVENOUS
  Filled 2021-01-15: qty 1

## 2021-01-15 MED ORDER — LEVALBUTEROL HCL 0.63 MG/3ML IN NEBU
INHALATION_SOLUTION | RESPIRATORY_TRACT | Status: AC
  Administered 2021-01-15: 1.89 mg
  Filled 2021-01-15: qty 9

## 2021-01-15 MED ORDER — METHYLPREDNISOLONE SODIUM SUCC 125 MG IJ SOLR
125.0000 mg | Freq: Once | INTRAMUSCULAR | Status: AC
  Administered 2021-01-15: 125 mg via INTRAVENOUS
  Filled 2021-01-15: qty 2

## 2021-01-15 MED ORDER — DOXYCYCLINE HYCLATE 100 MG PO CAPS
100.0000 mg | ORAL_CAPSULE | Freq: Two times a day (BID) | ORAL | 0 refills | Status: DC
Start: 2021-01-15 — End: 2021-10-24

## 2021-01-15 MED ORDER — LORAZEPAM 2 MG/ML IJ SOLN
0.5000 mg | Freq: Once | INTRAMUSCULAR | Status: AC
  Administered 2021-01-15: 0.5 mg via INTRAVENOUS
  Filled 2021-01-15: qty 1

## 2021-01-15 NOTE — ED Provider Notes (Signed)
[facility] EMERGENCY DEPARTMENT Provider Note   CSN: [id number] Arrival date & time: 01/15/21  1907     History Chief Complaint  Patient presents with   Shortness of Breath    [name] is a 45 y.o. female.  Patient complains of shortness of breath.  Patient has a history of COPD  The history is provided by the patient and medical records. No language interpreter was used.  Shortness of Breath Severity:  Moderate Onset quality:  Sudden Timing:  Constant Progression:  Waxing and waning Chronicity:  Recurrent Context: not activity   Relieved by:  Nothing Worsened by:  Nothing Ineffective treatments:  None tried Associated symptoms: wheezing   Associated symptoms: no abdominal pain, no chest pain, no cough, no headaches and no rash       Past Medical History:  Diagnosis Date   Alcohol abuse    Anemia 01/31/2011   Asthma    COPD (chronic obstructive pulmonary disease) ([place])    Depression    Hypoxia 02/01/2011   Medical history non-contributory    Needs sleep apnea assessment 2013.07.31   Tobacco abuse     Patient Active Problem List   Diagnosis Date Noted   Healthcare maintenance 02/19/2020   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, still smoking 01/16/2020   Acute on chronic respiratory failure ([place]) 02/04/2018   Acute respiratory failure with hypoxia (H[facility]) 04/29/2017   Leukocytosis 12/07/2016   Rhinovirus infection 12/07/2016   Acute on chronic respiratory failure with hypoxia ([place]) 12/06/2016   COPD with acute exacerbation ([place]) 12/06/2016   Hypokalemia 02/10/2016   Hypothyroidism 02/10/2016   CAP (community acquired pneumonia) 01/19/2013   Dehydration 01/19/2013   Nausea vomiting and diarrhea 01/19/2013   Alcohol dependence ([place]) 01/16/2012   Alcohol withdrawal ([place]) 01/16/2012   Hypoxia 02/01/2011   Hyponatremia 02/01/2011   Anemia 01/31/2011   Chest pain, pleuritic 01/29/2011   Back pain 01/29/2011   HCAP (healthcare-associated  pneumonia) 01/28/2011   Acute bronchitis 01/08/2011   COPD exacerbation ([place]) 01/08/2011   Tobacco abuse 01/08/2011   Polysubstance abuse ([place]) 01/08/2011   Major depression 01/08/2011    Past Surgical History:  Procedure Laterality Date   ANKLE GANGLION CYST EXCISION     BACK SURGERY     CESAREAN SECTION     COLONOSCOPY WITH PROPOFOL N/A 06/27/2016   Procedure: COLONOSCOPY WITH PROPOFOL;  Surgeon: Jonathon Bellows, MD;  Location: [facility] ENDOSCOPY;  Service: Endoscopy;  Laterality: N/A;   DILATION AND CURETTAGE OF UTERUS     ESOPHAGOGASTRODUODENOSCOPY (EGD) WITH PROPOFOL N/A 06/27/2016   Procedure: ESOPHAGOGASTRODUODENOSCOPY (EGD) WITH PROPOFOL;  Surgeon: Jonathon Bellows, MD;  Location: [facility] ENDOSCOPY;  Service: Endoscopy;  Laterality: N/A;     OB History   No obstetric history on file.     Family History  Problem Relation Age of Onset   Hypothyroidism Mother    Coronary artery disease Mother    Throat cancer Mother    Diabetes Maternal Grandmother     Social History   Tobacco Use   Smoking status: Every Day    Packs/day: 1.00    Years: 25.00    Pack years: 25.00    Types: Cigarettes   Smokeless tobacco: Never   Tobacco comments:    4-5 cigs daily 02/19/20  Vaping Use   Vaping Use: Some days  Substance Use Topics   Alcohol use: Yes   Drug use: No    Types: "Crack" cocaine    Comment: crack-Not since September  Home Medications Prior to Admission medications   Medication Sig Start Date End Date Taking? Authorizing Provider  doxycycline (VIBRAMYCIN) 100 MG capsule Take 1 capsule (100 mg total) by mouth 2 (two) times daily. One po bid x 7 days 01/15/21  Yes Milton Ferguson, MD  predniSONE (DELTASONE) 10 MG tablet Take 2 tablets (20 mg total) by mouth daily. 01/15/21  Yes Milton Ferguson, MD  albuterol (PROVENTIL) (2.5 MG/3ML) 0.083% nebulizer solution Take 3 mLs (2.5 mg total) by nebulization every 6 (six) hours as needed for wheezing or shortness of breath. For asthma 12/11/20    Kathie Dike, MD  albuterol (VENTOLIN HFA) 108 (90 Base) MCG/ACT inhaler Inhale 1-2 puffs into the lungs every 6 (six) hours as needed for wheezing or shortness of breath. 02/11/19   Noemi Chapel, MD  ALPRAZolam Duanne Moron) 0.25 MG tablet Take 0.25 mg by mouth 2 (two) times daily as needed for anxiety or sleep. 10/09/19   [provider]  azithromycin (ZITHROMAX) 250 MG tablet Take 1 tablet (250 mg total) by mouth daily. 12/11/20   Kathie Dike, MD  benzonatate (TESSALON PERLES) 100 MG capsule Take 1 capsule (100 mg total) by mouth 3 (three) times daily as needed for cough. 12/11/20 12/11/21  Kathie Dike, MD  Budeson-Glycopyrrol-Formoterol (BREZTRI AEROSPHERE) 160-9-4.8 MCG/ACT AERO Inhale 2 puffs into the lungs 2 (two) times daily. 12/11/20   Kathie Dike, MD  famotidine (PEPCID) 20 MG tablet One after supper Patient not taking: No sig reported 01/16/20   Tanda Rockers, MD  FLUoxetine (PROZAC) 40 MG capsule Take 40 mg by mouth daily. 08/05/16   [provider]  gabapentin (NEURONTIN) 300 MG capsule Take 300 mg by mouth 4 (four) times daily as needed (pain). 10/27/20   [provider]  levothyroxine (SYNTHROID, LEVOTHROID) 137 MCG tablet Take 137 mcg by mouth daily. 11/22/16   [provider]  lidocaine (XYLOCAINE) 5 % ointment Apply 1 application topically 3 (three) times daily as needed. Patient not taking: Reported on 12/10/2020 09/19/19   Long, Wonda Olds, MD  meloxicam (MOBIC) 7.5 MG tablet Take 7.5 mg by mouth 2 (two) times daily.  09/23/19   [provider]  montelukast (SINGULAIR) 10 MG tablet Take 1 tablet (10 mg total) by mouth at bedtime. 12/11/20   Kathie Dike, MD  pantoprazole (PROTONIX) 40 MG tablet Take 40 mg by mouth daily. 10/10/19   [provider]    Allergies    Patient has no known allergies.  Review of Systems   Review of Systems  Constitutional:  Negative for appetite change and fatigue.  HENT:  Negative for  congestion, ear discharge and sinus pressure.   Eyes:  Negative for discharge.  Respiratory:  Positive for shortness of breath and wheezing. Negative for cough.   Cardiovascular:  Negative for chest pain.  Gastrointestinal:  Negative for abdominal pain and diarrhea.  Genitourinary:  Negative for frequency and hematuria.  Musculoskeletal:  Negative for back pain.  Skin:  Negative for rash.  Neurological:  Negative for seizures and headaches.  Psychiatric/Behavioral:  Negative for hallucinations.    Physical Exam Updated Vital Signs BP (!) 101/57   Pulse 76   Temp 98.4 F (36.9 C) (Oral)   Resp 18   Ht 5\' 5"  (1.651 m)   Wt 90.7 kg   SpO2 97%   BMI 33.28 kg/m   Physical Exam Vitals and nursing note reviewed.  Constitutional:      Appearance: She is well-developed.  HENT:  Head: Normocephalic.     Nose: Nose normal.  Eyes:     General: No scleral icterus.    Conjunctiva/sclera: Conjunctivae normal.  Neck:     Thyroid: No thyromegaly.  Cardiovascular:     Rate and Rhythm: Normal rate and regular rhythm.     Heart sounds: No murmur heard.   No friction rub. No gallop.  Pulmonary:     Breath sounds: No stridor. Wheezing present. No rales.  Chest:     Chest wall: No tenderness.  Abdominal:     General: There is no distension.     Tenderness: There is no abdominal tenderness. There is no rebound.  Musculoskeletal:        General: Normal range of motion.     Cervical back: Neck supple.  Lymphadenopathy:     Cervical: No cervical adenopathy.  Skin:    Findings: No erythema or rash.  Neurological:     Mental Status: She is alert and oriented to person, place, and time.     Motor: No abnormal muscle tone.     Coordination: Coordination normal.  Psychiatric:        Behavior: Behavior normal.    ED Results / Procedures / Treatments   Labs (all labs ordered are listed, but only abnormal results are displayed) Labs Reviewed  CBC WITH DIFFERENTIAL/PLATELET -  Abnormal; Notable for the following components:      Result Value   Hemoglobin 9.4 (*)    HCT 31.6 (*)    MCV 78.8 (*)    MCH 23.4 (*)    MCHC 29.7 (*)    RDW 15.9 (*)    Eosinophils Absolute 0.7 (*)    All other components within normal limits  COMPREHENSIVE METABOLIC PANEL  TROPONIN I (HIGH SENSITIVITY)  TROPONIN I (HIGH SENSITIVITY)    EKG EKG Interpretation  Date/Time:  Saturday January 15 2021 19:19:21 EST Ventricular Rate:  104 PR Interval:  131 QRS Duration: 86 QT Interval:  362 QTC Calculation: 477 R Axis:   51 Text Interpretation: Sinus tachycardia Minimal ST depression, anterolateral leads Confirmed by Milton Ferguson [phone]) on 01/15/2021 7:29:05 PM  Radiology DG Chest Portable 1 View  Result Date: 01/15/2021 CLINICAL DATA:  Shortness of breath. Hx of asthma, COPD, and current smoker of 1 pack a day x 25 years. Not being tested for covid-19. EXAM: PORTABLE CHEST 1 VIEW COMPARISON:  Chest x-ray 12/09/2020. chest x-ray 11/10/2014. Chest x-ray 01/19/2013. FINDINGS: The heart and mediastinal contours are within normal limits. No focal consolidation. Chronic increased interstitial markings with no overt pulmonary edema. No pleural effusion. No pneumothorax. No acute osseous abnormality. IMPRESSION: No active disease. Electronically Signed   By: Iven Finn [name]   On: 01/15/2021 19:48    Procedures Procedures   Medications Ordered in ED Medications  albuterol (PROVENTIL) (2.5 MG/3ML) 0.083% nebulizer solution 3 mL (has no administration in time range)  levalbuterol (XOPENEX) 0.63 MG/3ML nebulizer solution (1.89 mg  Given 01/15/21 1922)  ipratropium (ATROVENT) 0.02 % nebulizer solution (0.5 mg  Given 01/15/21 1922)  methylPREDNISolone sodium succinate (SOLU-MEDROL) 125 mg/2 mL injection 125 mg (125 mg Intravenous Given 01/15/21 1946)  magnesium sulfate IVPB 2 g 50 mL (0 g Intravenous Stopped 01/15/21 2052)  morphine 2 MG/ML injection 2 mg (2 mg Intravenous Given 01/15/21  1947)  LORazepam (ATIVAN) injection 0.5 mg (0.5 mg Intravenous Given 01/15/21 2036)  ipratropium-albuterol (DUONEB) 0.5-2.5 (3) MG/3ML nebulizer solution 3 mL (3 mLs Nebulization Given 01/15/21 2054)  albuterol (PROVENTIL) (2.5 MG/3ML)  0.083% nebulizer solution 2.5 mg (2.5 mg Nebulization Given 01/15/21 2054)    ED Course  I have reviewed the triage vital signs and the nursing notes.  Pertinent labs & imaging results that were available during my care of the patient were reviewed by me and considered in my medical decision making (see chart for details).    MDM Rules/Calculators/A&P                           Patient with COPD exacerbation.  Patient improved with neb treatments and will be discharged home on prednisone and Doxy Final Clinical Impression(s) / ED Diagnoses Final diagnoses:  COPD exacerbation ([place])    Rx / DC Orders ED Discharge Orders          Ordered    predniSONE (DELTASONE) 10 MG tablet  Daily        01/15/21 2153    doxycycline (VIBRAMYCIN) 100 MG capsule  2 times daily        01/15/21 2153             Milton Ferguson, MD 01/15/21 2301

## 2021-01-15 NOTE — ED Triage Notes (Signed)
Pt reports SOB that started this morning, presents with audible wheezing and speaking in short sentences. SOB unrelieved with inhaler and at home breathing txt. Hx of asthma and COPD

## 2021-01-15 NOTE — Discharge Instructions (Signed)
Follow-up with your doctor this week for recheck.  Return if any problem

## 2021-02-18 ENCOUNTER — Ambulatory Visit
Admission: EM | Admit: 2021-02-18 | Discharge: 2021-02-18 | Disposition: A | Discharge status: Home / Self Care | Payer: Self-pay | Attending: Urgent Care | Admitting: Urgent Care

## 2021-02-18 ENCOUNTER — Other Ambulatory Visit: Payer: Self-pay

## 2021-02-18 ENCOUNTER — Ambulatory Visit (INDEPENDENT_AMBULATORY_CARE_PROVIDER_SITE_OTHER): Payer: Self-pay

## 2021-02-18 DIAGNOSIS — R0602 Shortness of breath: Secondary | ICD-10-CM

## 2021-02-18 DIAGNOSIS — R059 Cough, unspecified: Secondary | ICD-10-CM

## 2021-02-18 DIAGNOSIS — Z1152 Encounter for screening for COVID-19: Secondary | ICD-10-CM | POA: Diagnosis not present

## 2021-02-18 DIAGNOSIS — J449 Chronic obstructive pulmonary disease, unspecified: Secondary | ICD-10-CM

## 2021-02-18 DIAGNOSIS — R062 Wheezing: Secondary | ICD-10-CM

## 2021-02-18 MED ORDER — METHYLPREDNISOLONE SODIUM SUCC 125 MG IJ SOLR
125.0000 mg | Freq: Once | INTRAMUSCULAR | Status: AC
  Administered 2021-02-18: 125 mg via INTRAMUSCULAR

## 2021-02-18 MED ORDER — BREZTRI AEROSPHERE 160-9-4.8 MCG/ACT IN AERO: 2.0000 | INHALATION_SPRAY | Freq: Two times a day (BID) | RESPIRATORY_TRACT | 11 refills | Status: DC

## 2021-02-18 MED ORDER — PREDNISONE 50 MG PO TABS: 50.0000 mg | ORAL_TABLET | Freq: Every day | ORAL | 0 refills | Status: DC

## 2021-02-18 NOTE — ED Triage Notes (Signed)
Patient presents to Urgent Care with complaints of cough, SOB, fever, sore throat since 12/03. Pt states she was seen in the ED 12/03 for COPD exacerbation and received steroids which helped a bit. She states she continues to have these symptoms and had a breathing treatment before coming in. She states she uses 3L of 02 at home when she sleeps and her o2 has been in the low 90's range. Pt states her baseline is 94-95%. She is also treating symptoms with cough medications.

## 2021-02-18 NOTE — ED Provider Notes (Signed)
[place]   MRN: [id number] DOB: 06-20-1975  Subjective:   [name] is a 46 y.o. female presenting for 1 month history of persistent coughing, shortness of breath, wheezing.  Patient has a history of COPD, chronic respiratory failure with hypoxia.  She has not been on her inhalers as she has not been able to afford them from losing her insurance.  Patient is on oxygen at home, her oxygen saturation has been between 94 to 95%.  She has been using cough medications.  At her last visit she did have steroids and azithromycin.  This helped temporarily but did not resolve her symptoms completely.  No current facility-administered medications for this encounter.  Current Outpatient Medications:    albuterol (PROVENTIL) (2.5 MG/3ML) 0.083% nebulizer solution, Take 3 mLs (2.5 mg total) by nebulization every 6 (six) hours as needed for wheezing or shortness of breath. For asthma, Disp: 75 mL, Rfl: 1   albuterol (VENTOLIN HFA) 108 (90 Base) MCG/ACT inhaler, Inhale 1-2 puffs into the lungs every 6 (six) hours as needed for wheezing or shortness of breath., Disp: 6.7 g, Rfl: 0   ALPRAZolam (XANAX) 0.25 MG tablet, Take 0.25 mg by mouth 2 (two) times daily as needed for anxiety or sleep., Disp: , Rfl:    azithromycin (ZITHROMAX) 250 MG tablet, Take 1 tablet (250 mg total) by mouth daily., Disp: 4 each, Rfl: 0   benzonatate (TESSALON PERLES) 100 MG capsule, Take 1 capsule (100 mg total) by mouth 3 (three) times daily as needed for cough., Disp: 30 capsule, Rfl: 0   Budeson-Glycopyrrol-Formoterol (BREZTRI AEROSPHERE) 160-9-4.8 MCG/ACT AERO, Inhale 2 puffs into the lungs 2 (two) times daily., Disp: 10.7 g, Rfl: 11   doxycycline (VIBRAMYCIN) 100 MG capsule, Take 1 capsule (100 mg total) by mouth 2 (two) times daily. One po bid x 7 days, Disp: 14 capsule, Rfl: 0   famotidine (PEPCID) 20 MG tablet, One after supper (Patient not taking: No sig reported), Disp: 30 tablet, Rfl: 11    FLUoxetine (PROZAC) 40 MG capsule, Take 40 mg by mouth daily., Disp: , Rfl:    gabapentin (NEURONTIN) 300 MG capsule, Take 300 mg by mouth 4 (four) times daily as needed (pain)., Disp: , Rfl:    levothyroxine (SYNTHROID, LEVOTHROID) 137 MCG tablet, Take 137 mcg by mouth daily., Disp: , Rfl: 0   lidocaine (XYLOCAINE) 5 % ointment, Apply 1 application topically 3 (three) times daily as needed. (Patient not taking: Reported on 12/10/2020), Disp: 35.44 g, Rfl: 0   meloxicam (MOBIC) 7.5 MG tablet, Take 7.5 mg by mouth 2 (two) times daily. , Disp: , Rfl:    montelukast (SINGULAIR) 10 MG tablet, Take 1 tablet (10 mg total) by mouth at bedtime., Disp: 30 tablet, Rfl: 0   pantoprazole (PROTONIX) 40 MG tablet, Take 40 mg by mouth daily., Disp: , Rfl:    predniSONE (DELTASONE) 10 MG tablet, Take 2 tablets (20 mg total) by mouth daily., Disp: 14 tablet, Rfl: 0   No Known Allergies  Past Medical History:  Diagnosis Date   Alcohol abuse    Anemia 01/31/2011   Asthma    COPD (chronic obstructive pulmonary disease) ([place])    Depression    Hypoxia 02/01/2011   Medical history non-contributory    Needs sleep apnea assessment 2013.07.31   Tobacco abuse      Past Surgical History:  Procedure Laterality Date   ANKLE GANGLION CYST EXCISION     BACK SURGERY     CESAREAN SECTION  COLONOSCOPY WITH PROPOFOL N/A 06/27/2016   Procedure: COLONOSCOPY WITH PROPOFOL;  Surgeon: Jonathon Bellows, MD;  Location: [facility] ENDOSCOPY;  Service: Endoscopy;  Laterality: N/A;   DILATION AND CURETTAGE OF UTERUS     ESOPHAGOGASTRODUODENOSCOPY (EGD) WITH PROPOFOL N/A 06/27/2016   Procedure: ESOPHAGOGASTRODUODENOSCOPY (EGD) WITH PROPOFOL;  Surgeon: Jonathon Bellows, MD;  Location: [facility] ENDOSCOPY;  Service: Endoscopy;  Laterality: N/A;    Family History  Problem Relation Age of Onset   Hypothyroidism Mother    Coronary artery disease Mother    Throat cancer Mother    Diabetes Maternal Grandmother     Social History   Tobacco Use    Smoking status: Every Day    Packs/day: 1.00    Years: 25.00    Pack years: 25.00    Types: Cigarettes   Smokeless tobacco: Never   Tobacco comments:    4-5 cigs daily 02/19/20  Vaping Use   Vaping Use: Some days  Substance Use Topics   Alcohol use: Yes   Drug use: No    Types: "Crack" cocaine    Comment: crack-Not since September    ROS   Objective:   Vitals: BP 114/72 (BP Location: Right Arm)    Pulse 85    Temp 98 F (36.7 C) (Oral)    Resp (!) 28    LMP 02/18/2021    SpO2 94%   Physical Exam Constitutional:      General: She is not in acute distress.    Appearance: Normal appearance. She is well-developed. She is not ill-appearing, toxic-appearing or diaphoretic.  HENT:     Head: Normocephalic and atraumatic.     Nose: Nose normal.     Mouth/Throat:     Mouth: Mucous membranes are moist.  Eyes:     Extraocular Movements: Extraocular movements intact.  Cardiovascular:     Rate and Rhythm: Normal rate and regular rhythm.     Pulses: Normal pulses.     Heart sounds: Normal heart sounds. No murmur heard.   No friction rub. No gallop.  Pulmonary:     Effort: Pulmonary effort is normal. No respiratory distress.     Breath sounds: No stridor. Decreased breath sounds (in bibasilar fields) present. No wheezing, rhonchi or rales.  Skin:    General: Skin is warm and dry.     Findings: No rash.  Neurological:     Mental Status: She is alert and oriented to person, place, and time.  Psychiatric:        Mood and Affect: Mood normal.        Behavior: Behavior normal.        Thought Content: Thought content normal.    DG Chest 2 View  Result Date: 02/18/2021 CLINICAL DATA:  Cough and shortness of breath. EXAM: CHEST - 2 VIEW COMPARISON:  January 15, 2021 FINDINGS: Mild, diffuse, chronic appearing increased interstitial lung markings are noted. Mild bilateral perihilar peribronchial cuffing is seen with mild bibasilar atelectatic changes. There is no evidence of focal  consolidation, pleural effusion or pneumothorax. The heart size and mediastinal contours are within normal limits. The visualized skeletal structures are unremarkable. IMPRESSION: Chronic appearing increased interstitial lung markings and mild bibasilar atelectasis. Electronically Signed   By: Virgina Norfolk [name]   On: 02/18/2021 19:43     Assessment and Plan :   PDMP not reviewed this encounter.  1. Chronic obstructive pulmonary disease, unspecified COPD type ([place])   2. Encounter for screening for COVID-19   3. Shortness of breath  4. Wheezing    Low suspicion for an infectious process.  COVID flu test pending.  IM Solu-Medrol in clinic followed by an oral steroid course.  I refilled her inhaler.  Recommended she continue with her oxygen at home and breathing treatments. Counseled patient on potential for adverse effects with medications prescribed/recommended today, ER and return-to-clinic precautions discussed, patient verbalized understanding.    Jaynee Eagles, [place] 02/19/21 [phone]

## 2021-02-21 LAB — COVID-19, FLU A+B NAA
Influenza A, NAA: NOT DETECTED
Influenza B, NAA: NOT DETECTED
SARS-CoV-2, NAA: DETECTED — AB

## 2021-04-18 DIAGNOSIS — Z6833 Body mass index (BMI) 33.0-33.9, adult: Secondary | ICD-10-CM | POA: Diagnosis not present

## 2021-04-18 DIAGNOSIS — R030 Elevated blood-pressure reading, without diagnosis of hypertension: Secondary | ICD-10-CM | POA: Diagnosis not present

## 2021-04-18 DIAGNOSIS — R0602 Shortness of breath: Secondary | ICD-10-CM | POA: Diagnosis not present

## 2021-04-18 DIAGNOSIS — J449 Chronic obstructive pulmonary disease, unspecified: Secondary | ICD-10-CM | POA: Diagnosis not present

## 2021-04-22 DIAGNOSIS — Z6833 Body mass index (BMI) 33.0-33.9, adult: Secondary | ICD-10-CM | POA: Diagnosis not present

## 2021-04-22 DIAGNOSIS — E559 Vitamin D deficiency, unspecified: Secondary | ICD-10-CM | POA: Diagnosis not present

## 2021-04-22 DIAGNOSIS — Z0130 Encounter for examination of blood pressure without abnormal findings: Secondary | ICD-10-CM | POA: Diagnosis not present

## 2021-04-22 DIAGNOSIS — R5383 Other fatigue: Secondary | ICD-10-CM | POA: Diagnosis not present

## 2021-04-22 DIAGNOSIS — Z0000 Encounter for general adult medical examination without abnormal findings: Secondary | ICD-10-CM | POA: Diagnosis not present

## 2021-04-22 DIAGNOSIS — Z1159 Encounter for screening for other viral diseases: Secondary | ICD-10-CM | POA: Diagnosis not present

## 2021-04-22 DIAGNOSIS — Z79899 Other long term (current) drug therapy: Secondary | ICD-10-CM | POA: Diagnosis not present

## 2021-04-29 ENCOUNTER — Ambulatory Visit: Payer: Self-pay | Admitting: Primary Care

## 2021-05-05 ENCOUNTER — Emergency Department (HOSPITAL_COMMUNITY): Payer: BC Managed Care – PPO

## 2021-05-05 ENCOUNTER — Other Ambulatory Visit: Payer: Self-pay

## 2021-05-05 ENCOUNTER — Emergency Department (HOSPITAL_COMMUNITY)
Admission: EM | Admit: 2021-05-05 | Discharge: 2021-05-05 | Disposition: A | Discharge status: Home / Self Care | Payer: BC Managed Care – PPO | Attending: Emergency Medicine | Admitting: Emergency Medicine

## 2021-05-05 ENCOUNTER — Encounter (HOSPITAL_COMMUNITY): Payer: Self-pay | Admitting: *Deleted

## 2021-05-05 DIAGNOSIS — Z7951 Long term (current) use of inhaled steroids: Secondary | ICD-10-CM | POA: Insufficient documentation

## 2021-05-05 DIAGNOSIS — J441 Chronic obstructive pulmonary disease with (acute) exacerbation: Secondary | ICD-10-CM | POA: Diagnosis not present

## 2021-05-05 DIAGNOSIS — Z20822 Contact with and (suspected) exposure to covid-19: Secondary | ICD-10-CM | POA: Insufficient documentation

## 2021-05-05 DIAGNOSIS — R0602 Shortness of breath: Secondary | ICD-10-CM | POA: Diagnosis not present

## 2021-05-05 DIAGNOSIS — J45909 Unspecified asthma, uncomplicated: Secondary | ICD-10-CM | POA: Insufficient documentation

## 2021-05-05 LAB — BASIC METABOLIC PANEL
Anion gap: 8 (ref 5–15)
BUN: 12 mg/dL (ref 6–20)
CO2: 25 mmol/L (ref 22–32)
Calcium: 9.3 mg/dL (ref 8.9–10.3)
Chloride: 103 mmol/L (ref 98–111)
Creatinine, Ser: 0.65 mg/dL (ref 0.44–1.00)
GFR, Estimated: >60 mL/min (ref >60)
Glucose, Bld: 105 mg/dL — ABNORMAL HIGH (ref 70–99)
Potassium: 3.7 mmol/L (ref 3.5–5.1)
Sodium: 136 mmol/L (ref 135–145)

## 2021-05-05 LAB — CBC WITH DIFFERENTIAL/PLATELET
Abs Immature Granulocytes: 0.01 10*3/uL (ref 0.00–0.07)
Basophils Absolute: 0 10*3/uL (ref 0.0–0.1)
Basophils Relative: 1 %
Eosinophils Absolute: 0.3 10*3/uL (ref 0.0–0.5)
Eosinophils Relative: 6 %
HCT: 35.7 % — ABNORMAL LOW (ref 36.0–46.0)
Hemoglobin: 10.3 g/dL — ABNORMAL LOW (ref 12.0–15.0)
Immature Granulocytes: 0 %
Lymphocytes Relative: 37 %
Lymphs Abs: 1.9 10*3/uL (ref 0.7–4.0)
MCH: 22.7 pg — ABNORMAL LOW (ref 26.0–34.0)
MCHC: 28.9 g/dL — ABNORMAL LOW (ref 30.0–36.0)
MCV: 78.6 fL — ABNORMAL LOW (ref 80.0–100.0)
Monocytes Absolute: 0.3 10*3/uL (ref 0.1–1.0)
Monocytes Relative: 7 %
Neutro Abs: 2.5 10*3/uL (ref 1.7–7.7)
Neutrophils Relative %: 49 %
Platelets: 305 10*3/uL (ref 150–400)
RBC: 4.54 MIL/uL (ref 3.87–5.11)
RDW: 17.1 % — ABNORMAL HIGH (ref 11.5–15.5)
WBC: 5 10*3/uL (ref 4.0–10.5)
nRBC: 0 % (ref 0.0–0.2)

## 2021-05-05 LAB — TROPONIN I (HIGH SENSITIVITY)
Troponin I (High Sensitivity): <2 ng/L (ref <18)
Troponin I (High Sensitivity): <2 ng/L (ref <18)

## 2021-05-05 LAB — RESP PANEL BY RT-PCR (FLU A&B, COVID) ARPGX2
Influenza A by PCR: NEGATIVE
Influenza B by PCR: NEGATIVE
SARS Coronavirus 2 by RT PCR: NEGATIVE

## 2021-05-05 MED ORDER — METHYLPREDNISOLONE SODIUM SUCC 125 MG IJ SOLR
125.0000 mg | Freq: Once | INTRAMUSCULAR | Status: AC
Start: 2021-05-05 — End: 2021-05-05
  Administered 2021-05-05: 125 mg via INTRAVENOUS
  Filled 2021-05-05: qty 2

## 2021-05-05 MED ORDER — MAGNESIUM SULFATE 2 GM/50ML IV SOLN
2.0000 g | Freq: Once | INTRAVENOUS | Status: AC
  Administered 2021-05-05: 2 g via INTRAVENOUS
  Filled 2021-05-05: qty 50

## 2021-05-05 MED ORDER — IPRATROPIUM-ALBUTEROL 0.5-2.5 (3) MG/3ML IN SOLN
3.0000 mL | Freq: Once | RESPIRATORY_TRACT | Status: AC
  Administered 2021-05-05: 3 mL via RESPIRATORY_TRACT
  Filled 2021-05-05: qty 3

## 2021-05-05 NOTE — ED Notes (Signed)
Walked the pt and pt stated she was dizzy, PT was wheezing bad and 02 stayed 90-95% ?

## 2021-05-05 NOTE — Discharge Instructions (Signed)
Please follow-up with your primary care provider tomorrow.  Return to the emergency department immediately if you experience worsening symptoms like we talked about today. ?

## 2021-05-05 NOTE — ED Provider Notes (Signed)
?[facility] ?Provider Note ? ? ?CSN: [id number] ?Arrival date & time: 05/05/21  1043 ? ?  ? ?History ?Chief Complaint  ?Patient presents with  ? Shortness of Breath  ? ? ?[name] is a 46 y.o. female with history of COPD on nightly oxygen and as needed daily oxygen who presents to the emergency department with shortness of breath and wheezing that has been ongoing but worse over the last 48 hours.  Patient states she has not felt better since she was discharged in the hospital back in October.  She was seen evaluated by her primary care doctor and just finished a round of steroids approximately week ago.  She had a chest x-ray at that time.  I am unable to view that old records.  Patient states has been having intermittent fevers and productive cough.  She also complains of severe chest tightness.  She denies any abdominal symptoms including nausea, vomiting, diarrhea.  She been taking Tylenol for her chest tightness with little improvement.  Patient states over the last week or so she has been having to use daily oxygen approximately 3 L which is abnormal for her. ? ? ?Shortness of Breath ? ?  ? ?Home Medications ?Prior to Admission medications   ?Medication Sig Start Date End Date Taking? Authorizing Provider  ?albuterol (PROVENTIL) (2.5 MG/3ML) 0.083% nebulizer solution Take 3 mLs (2.5 mg total) by nebulization every 6 (six) hours as needed for wheezing or shortness of breath. For asthma 12/11/20  Yes Kathie Dike, MD  ?albuterol (VENTOLIN HFA) 108 (90 Base) MCG/ACT inhaler Inhale 1-2 puffs into the lungs every 6 (six) hours as needed for wheezing or shortness of breath. 02/11/19  Yes Noemi Chapel, MD  ?ALPRAZolam Duanne Moron) 0.25 MG tablet Take 0.25 mg by mouth 2 (two) times daily as needed for anxiety or sleep. 10/09/19  Yes [provider]  ?budesonide-formoterol (SYMBICORT) 80-4.5 MCG/ACT inhaler Inhale 2 puffs into the lungs 2 (two) times daily. 04/27/21  Yes [provider]  ?FLUoxetine (PROZAC) 40 MG capsule Take 40 mg by mouth daily. 08/05/16  Yes [provider]  ?gabapentin (NEURONTIN) 300 MG capsule Take 300 mg by mouth 4 (four) times daily as needed (pain). 10/27/20  Yes [provider]  ?levothyroxine (SYNTHROID, LEVOTHROID) 137 MCG tablet Take 137 mcg by mouth daily. 11/22/16  Yes [provider]  ?meloxicam (MOBIC) 7.5 MG tablet Take 7.5 mg by mouth 2 (two) times daily.  09/23/19  Yes [provider]  ?montelukast (SINGULAIR) 10 MG tablet Take 1 tablet (10 mg total) by mouth at bedtime. 12/11/20  Yes Kathie Dike, MD  ?pantoprazole (PROTONIX) 40 MG tablet Take 40 mg by mouth daily. 10/10/19  Yes [provider]  ?azithromycin (ZITHROMAX) 250 MG tablet Take 1 tablet (250 mg total) by mouth daily. ?Patient not taking: Reported on 05/05/2021 12/11/20   Kathie Dike, MD  ?benzonatate (TESSALON PERLES) 100 MG capsule Take 1 capsule (100 mg total) by mouth 3 (three) times daily as needed for cough. ?Patient not taking: Reported on 05/05/2021 12/11/20 12/11/21  Kathie Dike, MD  ?Budeson-Glycopyrrol-Formoterol (BREZTRI AEROSPHERE) 160-9-4.8 MCG/ACT AERO Inhale 2 puffs into the lungs 2 (two) times daily. ?Patient not taking: Reported on 05/05/2021 02/18/21   Jaynee Eagles, PA-C  ?doxycycline (VIBRAMYCIN) 100 MG capsule Take 1 capsule (100 mg total) by mouth 2 (two) times daily. One po bid x 7 days ?Patient not taking: Reported on 05/05/2021 01/15/21   Milton Ferguson, MD  ?famotidine (PEPCID) 20  MG tablet One after supper ?Patient not taking: Reported on 12/10/2020 01/16/20   Tanda Rockers, MD  ?lidocaine (XYLOCAINE) 5 % ointment Apply 1 application topically 3 (three) times daily as needed. ?Patient not taking: Reported on 12/10/2020 09/19/19   Long, Wonda Olds, MD  ?predniSONE (DELTASONE) 20 MG tablet Take by mouth. :TAKE 2 TABLETS BY MOUTH ONCE DAILY FOR 2 DAYS THEN 1 ONCE DAILY FOR 2 DAYS THEN 1/2 (ONE-HALF) ONCE DAILY ?Patient  not taking: Reported on 05/05/2021 04/18/21   [provider]  ?predniSONE (DELTASONE) 50 MG tablet Take 1 tablet (50 mg total) by mouth daily with breakfast. ?Patient not taking: Reported on 05/05/2021 02/18/21   Jaynee Eagles, PA-C  ?   ? ?Allergies    ?Patient has no known allergies.   ? ?Review of Systems   ?Review of Systems  ?Respiratory:  Positive for shortness of breath.   ?All other systems reviewed and are negative. ? ?Physical Exam ?Updated Vital Signs ?BP 105/66   Pulse 93   Temp 98.2 ?F (36.8 ?C)   Resp 16   Ht '5\' 5"'$  (1.651 m)   Wt 91.2 kg   LMP 04/14/2021   SpO2 94%   BMI 33.45 kg/m?  ?Physical Exam ?Vitals and nursing note reviewed.  ?Constitutional:   ?   General: She is not in acute distress. ?   Appearance: Normal appearance.  ?HENT:  ?   Head: Normocephalic and atraumatic.  ?Eyes:  ?   General:     ?   Right eye: No discharge.     ?   Left eye: No discharge.  ?Cardiovascular:  ?   Comments: Regular rate and rhythm.  S1/S2 are distinct without any evidence of murmur, rubs, or gallops.  Radial pulses are 2+ bilaterally.  Dorsalis pedis pulses are 2+ bilaterally.  No evidence of pedal edema. ?Pulmonary:  ?   Effort: Tachypnea present.  ?   Breath sounds: Wheezing present.  ?   Comments: Wheezes can be heard without auscultation. ?Abdominal:  ?   General: Abdomen is flat. Bowel sounds are normal. There is no distension.  ?   Tenderness: There is no abdominal tenderness. There is no guarding or rebound.  ?Musculoskeletal:     ?   General: Normal range of motion.  ?   Cervical back: Neck supple.  ?Skin: ?   General: Skin is warm and dry.  ?   Findings: No rash.  ?Neurological:  ?   General: No focal deficit present.  ?   Mental Status: She is alert.  ?Psychiatric:     ?   Mood and Affect: Mood normal.     ?   Behavior: Behavior normal.  ? ? ?ED Results / Procedures / Treatments   ?Labs ?(all labs ordered are listed, but only abnormal results are displayed) ?Labs Reviewed  ?CBC WITH  DIFFERENTIAL/PLATELET - Abnormal; Notable for the following components:  ?    Result Value  ? Hemoglobin 10.3 (*)   ? HCT 35.7 (*)   ? MCV 78.6 (*)   ? MCH 22.7 (*)   ? MCHC 28.9 (*)   ? RDW 17.1 (*)   ? All other components within normal limits  ?BASIC METABOLIC PANEL - Abnormal; Notable for the following components:  ? Glucose, Bld 105 (*)   ? All other components within normal limits  ?RESP PANEL BY RT-PCR (FLU A&B, COVID) [id number]  ?TROPONIN I (HIGH SENSITIVITY)  ?TROPONIN I (HIGH SENSITIVITY)  ? ? ?EKG ?  None ? ?Radiology ?DG Chest Port 1 View ? ?Result Date: 05/05/2021 ?CLINICAL DATA:  Shortness of breath EXAM: PORTABLE CHEST 1 VIEW COMPARISON:  02/18/2021 FINDINGS: The heart size and mediastinal contours are within normal limits. No focal airspace consolidation, pleural effusion, or pneumothorax. The visualized skeletal structures are unremarkable. IMPRESSION: No active disease. Electronically Signed   By: Davina Poke D.O.   On: 05/05/2021 11:53   ? ?Procedures ?Procedures  ? ? ?Medications Ordered in ED ?Medications  ?ipratropium-albuterol (DUONEB) 0.5-2.5 (3) MG/3ML nebulizer solution 3 mL (3 mLs Nebulization Given 05/05/21 1123)  ?methylPREDNISolone sodium succinate (SOLU-MEDROL) 125 mg/2 mL injection 125 mg (125 mg Intravenous Given 05/05/21 1127)  ?magnesium sulfate IVPB 2 g 50 mL (0 g Intravenous Stopped 05/05/21 1301)  ?ipratropium-albuterol (DUONEB) 0.5-2.5 (3) MG/3ML nebulizer solution 3 mL (3 mLs Nebulization Given 05/05/21 1341)  ? ? ?ED Course/ Medical Decision Making/ A&P ?Clinical Course as of 05/05/21 1510  ?Thu May 05, 2021  ?1325 On reevaluation, patient states she is feeling much better.  Patient still having significant expiratory wheeze [CF]  ?  ?Clinical Course User Index ?[CF] Myna Bright M, PA-C  ? ?                        ?Medical Decision Making ?Amount and/or Complexity of Data Reviewed ?Labs: ordered. ?Radiology: ordered. ? ?Risk ?Prescription drug management. ? ? ?This patient  presents to the ED for concern of shortness of breath, this involves an extensive number of treatment options, and is a complaint that carries with it a high risk of complications and morbidity.  The differential diagnosis i

## 2021-05-05 NOTE — ED Triage Notes (Signed)
Short ness of breath for 2 weeks getting worse ?

## 2021-05-07 DIAGNOSIS — D509 Iron deficiency anemia, unspecified: Secondary | ICD-10-CM | POA: Diagnosis not present

## 2021-05-07 DIAGNOSIS — E038 Other specified hypothyroidism: Secondary | ICD-10-CM | POA: Diagnosis not present

## 2021-05-07 DIAGNOSIS — E559 Vitamin D deficiency, unspecified: Secondary | ICD-10-CM | POA: Diagnosis not present

## 2021-05-07 DIAGNOSIS — R768 Other specified abnormal immunological findings in serum: Secondary | ICD-10-CM | POA: Diagnosis not present

## 2021-07-27 ENCOUNTER — Ambulatory Visit (INDEPENDENT_AMBULATORY_CARE_PROVIDER_SITE_OTHER): Payer: BC Managed Care – PPO

## 2021-07-27 ENCOUNTER — Ambulatory Visit
Admission: EM | Admit: 2021-07-27 | Discharge: 2021-07-27 | Disposition: A | Discharge status: Home / Self Care | Payer: BC Managed Care – PPO | Attending: Family Medicine | Admitting: Family Medicine

## 2021-07-27 DIAGNOSIS — R062 Wheezing: Secondary | ICD-10-CM

## 2021-07-27 DIAGNOSIS — R059 Cough, unspecified: Secondary | ICD-10-CM

## 2021-07-27 DIAGNOSIS — J441 Chronic obstructive pulmonary disease with (acute) exacerbation: Secondary | ICD-10-CM | POA: Insufficient documentation

## 2021-07-27 DIAGNOSIS — R0602 Shortness of breath: Secondary | ICD-10-CM | POA: Diagnosis not present

## 2021-07-27 DIAGNOSIS — J069 Acute upper respiratory infection, unspecified: Secondary | ICD-10-CM | POA: Insufficient documentation

## 2021-07-27 DIAGNOSIS — J029 Acute pharyngitis, unspecified: Secondary | ICD-10-CM | POA: Diagnosis not present

## 2021-07-27 LAB — POCT RAPID STREP A (OFFICE): Rapid Strep A Screen: NEGATIVE

## 2021-07-27 MED ORDER — AZITHROMYCIN 250 MG PO TABS
ORAL_TABLET | ORAL | 0 refills | Status: DC
Start: 2021-07-27 — End: 2021-10-24

## 2021-07-27 MED ORDER — PREDNISONE 20 MG PO TABS: 40.0000 mg | ORAL_TABLET | Freq: Every day | ORAL | 0 refills | Status: DC

## 2021-07-27 MED ORDER — DEXAMETHASONE SODIUM PHOSPHATE 10 MG/ML IJ SOLN
10.0000 mg | Freq: Once | INTRAMUSCULAR | Status: AC
  Administered 2021-07-27: 10 mg via INTRAMUSCULAR

## 2021-07-27 MED ORDER — PROMETHAZINE-DM 6.25-15 MG/5ML PO SYRP: 5.0000 mL | ORAL_SOLUTION | Freq: Four times a day (QID) | ORAL | 0 refills | Status: DC | PRN

## 2021-07-27 MED ORDER — IPRATROPIUM-ALBUTEROL 0.5-2.5 (3) MG/3ML IN SOLN
3.0000 mL | Freq: Once | RESPIRATORY_TRACT | Status: AC
  Administered 2021-07-27: 3 mL via RESPIRATORY_TRACT

## 2021-07-27 NOTE — ED Provider Notes (Signed)
[facility]    CSN: [id number] Arrival date & time: 07/27/21  1031      History   Chief Complaint No chief complaint on file.   HPI JOHANA HOPKINSON is a 46 y.o. female.   Presenting today with 1 day history of sore, swollen feeling throat, body aches, cough, wheezing, chest tightness, shortness of breath.  Denies known fever, chills, abdominal pain, nausea vomiting or diarrhea.  So far taking Chloraseptic spray, breathing treatments with minimal relief.  History of COPD and asthma on Symbicort, albuterol, nebulizer treatments.  Last neb treatment was last night.  No known sick contacts recently.    Past Medical History:  Diagnosis Date   Alcohol abuse    Anemia 01/31/2011   Asthma    COPD (chronic obstructive pulmonary disease) ([place])    Depression    Hypoxia 02/01/2011   Medical history non-contributory    Needs sleep apnea assessment 2013.07.31   Tobacco abuse     Patient Active Problem List   Diagnosis Date Noted   Healthcare maintenance 02/19/2020   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, still smoking 01/16/2020   Acute on chronic respiratory failure ([place]) 02/04/2018   Acute respiratory failure with hypoxia ([place]) 04/29/2017   Leukocytosis 12/07/2016   Rhinovirus infection 12/07/2016   Acute on chronic respiratory failure with hypoxia ([place]) 12/06/2016   COPD with acute exacerbation ([place]) 12/06/2016   Hypokalemia 02/10/2016   Hypothyroidism 02/10/2016   CAP (community acquired pneumonia) 01/19/2013   Dehydration 01/19/2013   Nausea vomiting and diarrhea 01/19/2013   Alcohol dependence ([place]) 01/16/2012   Alcohol withdrawal ([place]) 01/16/2012   Hypoxia 02/01/2011   Hyponatremia 02/01/2011   Anemia 01/31/2011   Chest pain, pleuritic 01/29/2011   Back pain 01/29/2011   HCAP (healthcare-associated pneumonia) 01/28/2011   Acute bronchitis 01/08/2011   COPD exacerbation ([place]) 01/08/2011   Tobacco abuse 01/08/2011   Polysubstance abuse ([place])  01/08/2011   Major depression 01/08/2011    Past Surgical History:  Procedure Laterality Date   ANKLE GANGLION CYST EXCISION     BACK SURGERY     CESAREAN SECTION     COLONOSCOPY WITH PROPOFOL N/A 06/27/2016   Procedure: COLONOSCOPY WITH PROPOFOL;  Surgeon: Jonathon Bellows, MD;  Location: [facility] ENDOSCOPY;  Service: Endoscopy;  Laterality: N/A;   DILATION AND CURETTAGE OF UTERUS     ESOPHAGOGASTRODUODENOSCOPY (EGD) WITH PROPOFOL N/A 06/27/2016   Procedure: ESOPHAGOGASTRODUODENOSCOPY (EGD) WITH PROPOFOL;  Surgeon: Jonathon Bellows, MD;  Location: [facility] ENDOSCOPY;  Service: Endoscopy;  Laterality: N/A;    OB History   No obstetric history on file.      Home Medications    Prior to Admission medications   Medication Sig Start Date End Date Taking? Authorizing Provider  Benzocaine-Menthol (CHLORASEPTIC MAX SORE THROAT) 15-10 MG LOZG Use as directed in the mouth or throat.   Yes [provider]  predniSONE (DELTASONE) 20 MG tablet Take 2 tablets (40 mg total) by mouth daily with breakfast. 07/27/21  Yes Volney American, PA-C  promethazine-dextromethorphan (PROMETHAZINE-DM) 6.25-15 MG/5ML syrup Take 5 mLs by mouth 4 (four) times daily as needed. 07/27/21  Yes Volney American, PA-C  albuterol (PROVENTIL) (2.5 MG/3ML) 0.083% nebulizer solution Take 3 mLs (2.5 mg total) by nebulization every 6 (six) hours as needed for wheezing or shortness of breath. For asthma 12/11/20   Kathie Dike, MD  albuterol (VENTOLIN HFA) 108 (90 Base) MCG/ACT inhaler Inhale 1-2 puffs into the lungs every 6 (six) hours as needed for wheezing  or shortness of breath. 02/11/19   Noemi Chapel, MD  ALPRAZolam Duanne Moron) 0.25 MG tablet Take 0.25 mg by mouth 2 (two) times daily as needed for anxiety or sleep. 10/09/19   [provider]  azithromycin (ZITHROMAX) 250 MG tablet Take 2 tabs day one, then 1 tab daily until complete 07/27/21   Volney American, PA-C  benzonatate (TESSALON PERLES) 100 MG  capsule Take 1 capsule (100 mg total) by mouth 3 (three) times daily as needed for cough. Patient not taking: Reported on 05/05/2021 12/11/20 12/11/21  Kathie Dike, MD  Budeson-Glycopyrrol-Formoterol (BREZTRI AEROSPHERE) 160-9-4.8 MCG/ACT AERO Inhale 2 puffs into the lungs 2 (two) times daily. Patient not taking: Reported on 05/05/2021 02/18/21   Jaynee Eagles, PA-C  budesonide-formoterol [facility]) 80-4.5 MCG/ACT inhaler Inhale 2 puffs into the lungs 2 (two) times daily. 04/27/21   [provider]  doxycycline (VIBRAMYCIN) 100 MG capsule Take 1 capsule (100 mg total) by mouth 2 (two) times daily. One po bid x 7 days Patient not taking: Reported on 05/05/2021 01/15/21   Milton Ferguson, MD  famotidine (PEPCID) 20 MG tablet One after supper Patient not taking: Reported on 12/10/2020 01/16/20   Tanda Rockers, MD  FLUoxetine (PROZAC) 40 MG capsule Take 40 mg by mouth daily. 08/05/16   [provider]  gabapentin (NEURONTIN) 300 MG capsule Take 300 mg by mouth 4 (four) times daily as needed (pain). 10/27/20   [provider]  levothyroxine (SYNTHROID) 150 MCG tablet Take 150 mcg by mouth daily. 05/08/21   [provider]  levothyroxine (SYNTHROID, LEVOTHROID) 137 MCG tablet Take 137 mcg by mouth daily. 11/22/16   [provider]  lidocaine (XYLOCAINE) 5 % ointment Apply 1 application topically 3 (three) times daily as needed. Patient not taking: Reported on 12/10/2020 09/19/19   Long, Wonda Olds, MD  meloxicam (MOBIC) 7.5 MG tablet Take 7.5 mg by mouth 2 (two) times daily.  09/23/19   [provider]  montelukast (SINGULAIR) 10 MG tablet Take 1 tablet (10 mg total) by mouth at bedtime. 12/11/20   Kathie Dike, MD  pantoprazole (PROTONIX) 40 MG tablet Take 40 mg by mouth daily. 10/10/19   [provider]  predniSONE (DELTASONE) 20 MG tablet Take by mouth. :TAKE 2 TABLETS BY MOUTH ONCE DAILY FOR 2 DAYS THEN 1 ONCE DAILY FOR 2 DAYS THEN 1/2 (ONE-HALF)  ONCE DAILY Patient not taking: Reported on 05/05/2021 04/18/21   [provider]  predniSONE (DELTASONE) 50 MG tablet Take 1 tablet (50 mg total) by mouth daily with breakfast. Patient not taking: Reported on 05/05/2021 02/18/21   Jaynee Eagles, PA-C  Vitamin D, Ergocalciferol, (DRISDOL) 1.25 MG (50000 UNIT) CAPS capsule Take 50,000 Units by mouth once a week. 05/08/21   [provider]    Family History Family History  Problem Relation Age of Onset   Hypothyroidism Mother    Coronary artery disease Mother    Throat cancer Mother    Diabetes Maternal Grandmother     Social History Social History   Tobacco Use   Smoking status: Every Day    Packs/day: 1.00    Years: 25.00    Total pack years: 25.00    Types: Cigarettes   Smokeless tobacco: Never   Tobacco comments:    4-5 cigs daily 02/19/20  Vaping Use   Vaping Use: Some days  Substance Use Topics   Alcohol use: Yes   Drug use: No    Types: "Crack" cocaine    Comment: crack-Not  since September     Allergies   Patient has no known allergies.   Review of Systems Review of Systems Per HPI  Physical Exam Triage Vital Signs ED Triage Vitals  Enc Vitals Group     BP 07/27/21 1053 108/62     Pulse Rate 07/27/21 1053 97     Resp 07/27/21 1053 20     Temp 07/27/21 1053 98.7 F (37.1 C)     Temp Source 07/27/21 1053 Oral     SpO2 07/27/21 1053 94 %     Weight --      Height --      Head Circumference --      Peak Flow --      Pain Score 07/27/21 1052 9     Pain Loc --      Pain Edu? --      Excl. in Time? --    No data found.  Updated Vital Signs BP 108/62 (BP Location: Right Arm)   Pulse 97   Temp 98.7 F (37.1 C) (Oral)   Resp 20   LMP  (Within Weeks) Comment: 2 weeks  SpO2 94%   Visual Acuity Right Eye Distance:   Left Eye Distance:   Bilateral Distance:    Right Eye Near:   Left Eye Near:    Bilateral Near:     Physical Exam Vitals and nursing note reviewed.  Constitutional:       Appearance: Normal appearance.  HENT:     Head: Atraumatic.     Right Ear: Tympanic membrane and external ear normal.     Left Ear: Tympanic membrane and external ear normal.     Nose: Rhinorrhea present.     Mouth/Throat:     Mouth: Mucous membranes are moist.     Pharynx: Posterior oropharyngeal erythema present. No oropharyngeal exudate.     Comments: Moderate bilateral tonsillar erythema, edema.  Uvula midline, oral airway patent Eyes:     Extraocular Movements: Extraocular movements intact.     Conjunctiva/sclera: Conjunctivae normal.  Cardiovascular:     Rate and Rhythm: Normal rate and regular rhythm.     Heart sounds: Normal heart sounds.  Pulmonary:     Effort: Pulmonary effort is normal.     Breath sounds: Wheezing present. No rales.     Comments: Significant wheezes bilaterally, very mildly improved with DuoNeb treatment in clinic.  Breathing comfortably on room air overall, speaking in full sentences Musculoskeletal:        General: Normal range of motion.     Cervical back: Normal range of motion and neck supple.  Lymphadenopathy:     Cervical: Cervical adenopathy present.  Skin:    General: Skin is warm and dry.  Neurological:     Mental Status: She is alert and oriented to person, place, and time.  Psychiatric:        Mood and Affect: Mood normal.        Thought Content: Thought content normal.      UC Treatments / Results  Labs (all labs ordered are listed, but only abnormal results are displayed) Labs Reviewed  CULTURE, GROUP A STREP ([place])  COVID-19, FLU A+B NAA  POCT RAPID STREP A (OFFICE)    EKG   Radiology DG Chest 2 View  Result Date: 07/27/2021 CLINICAL DATA:  Shortness of breath and wheezing.  Cough for 1 day EXAM: CHEST - 2 VIEW COMPARISON:  05/05/2021 FINDINGS: Midline trachea. Normal heart size and mediastinal contours. No pleural  effusion or pneumothorax. Diffuse peribronchial thickening. No lobar consolidation. IMPRESSION: No acute  cardiopulmonary disease. Peribronchial thickening which may relate to chronic bronchitis or smoking. Electronically Signed   By: Abigail Miyamoto [name]   On: 07/27/2021 11:41    Procedures Procedures (including critical care time)  Medications Ordered in UC Medications  ipratropium-albuterol (DUONEB) 0.5-2.5 (3) MG/3ML nebulizer solution 3 mL (3 mLs Nebulization Given 07/27/21 1118)  dexamethasone (DECADRON) injection 10 mg (10 mg Intramuscular Given 07/27/21 1118)    Initial Impression / Assessment and Plan / UC Course  I have reviewed the triage vital signs and the nursing notes.  Pertinent labs & imaging results that were available during my care of the patient were reviewed by me and considered in my medical decision making (see chart for details).     Overall vital signs reassuring and she appears in no acute distress.  DuoNeb performed in clinic given significant wheezing, very mild improvement after this.  IM Decadron also given in clinic.  Suspect viral upper respiratory infection causing a COPD and asthma exacerbation.  Rapid strep negative, throat culture and COVID and flu testing pending.  We will treat with prednisone, azithromycin, Phenergan DM in addition to around-the-clock breathing treatments and continued inhaler regimen.  Strict ED precautions given for worsening symptoms, return for recheck if not improving.  Work note given.  Final Clinical Impressions(s) / UC Diagnoses   Final diagnoses:  Sore throat  Viral URI with cough  COPD exacerbation [facility])   Discharge Instructions   None    ED Prescriptions     Medication Sig Dispense Auth. Provider   azithromycin (ZITHROMAX) 250 MG tablet Take 2 tabs day one, then 1 tab daily until complete 5 each Volney American, PA-C   promethazine-dextromethorphan (PROMETHAZINE-DM) 6.25-15 MG/5ML syrup Take 5 mLs by mouth 4 (four) times daily as needed. 100 mL Volney American, PA-C   predniSONE (DELTASONE) 20 MG tablet Take  2 tablets (40 mg total) by mouth daily with breakfast. 10 tablet Volney American, [place]      PDMP not reviewed this encounter.   [place], [place], [place] 07/27/21 1210

## 2021-07-27 NOTE — ED Triage Notes (Signed)
Pt reports sore throat, body aches, hurts to swallow since lats night. Chloraseptic spray gives relief for some minutes.

## 2021-07-28 LAB — COVID-19, FLU A+B NAA
Influenza A, NAA: NOT DETECTED
Influenza B, NAA: NOT DETECTED
SARS-CoV-2, NAA: NOT DETECTED

## 2021-07-30 LAB — CULTURE, GROUP A STREP (THRC)

## 2021-10-04 DIAGNOSIS — M5451 Vertebrogenic low back pain: Secondary | ICD-10-CM | POA: Diagnosis not present

## 2021-10-04 DIAGNOSIS — M5116 Intervertebral disc disorders with radiculopathy, lumbar region: Secondary | ICD-10-CM | POA: Diagnosis not present

## 2021-10-04 DIAGNOSIS — S39012A Strain of muscle, fascia and tendon of lower back, initial encounter: Secondary | ICD-10-CM | POA: Diagnosis not present

## 2021-10-15 DIAGNOSIS — M5416 Radiculopathy, lumbar region: Secondary | ICD-10-CM | POA: Diagnosis not present

## 2021-10-24 ENCOUNTER — Encounter: Payer: Self-pay | Admitting: Family

## 2021-10-24 ENCOUNTER — Ambulatory Visit (INDEPENDENT_AMBULATORY_CARE_PROVIDER_SITE_OTHER)
Admission: RE | Admit: 2021-10-24 | Discharge: 2021-10-24 | Disposition: A | Discharge status: Home / Self Care | Payer: BC Managed Care – PPO | Source: Ambulatory Visit | Attending: Family | Admitting: Family

## 2021-10-24 ENCOUNTER — Ambulatory Visit (INDEPENDENT_AMBULATORY_CARE_PROVIDER_SITE_OTHER): Payer: BC Managed Care – PPO | Admitting: Family

## 2021-10-24 VITALS — BP 98/68 | HR 76 | Temp 98.6°F | Resp 16 | Ht 64.25 in | Wt 211.5 lb

## 2021-10-24 DIAGNOSIS — R768 Other specified abnormal immunological findings in serum: Secondary | ICD-10-CM

## 2021-10-24 DIAGNOSIS — E039 Hypothyroidism, unspecified: Secondary | ICD-10-CM

## 2021-10-24 DIAGNOSIS — M1990 Unspecified osteoarthritis, unspecified site: Secondary | ICD-10-CM

## 2021-10-24 DIAGNOSIS — M25572 Pain in left ankle and joints of left foot: Secondary | ICD-10-CM

## 2021-10-24 DIAGNOSIS — J449 Chronic obstructive pulmonary disease, unspecified: Secondary | ICD-10-CM

## 2021-10-24 DIAGNOSIS — Z13220 Encounter for screening for lipoid disorders: Secondary | ICD-10-CM | POA: Diagnosis not present

## 2021-10-24 DIAGNOSIS — M25472 Effusion, left ankle: Secondary | ICD-10-CM

## 2021-10-24 DIAGNOSIS — Z720 Tobacco use: Secondary | ICD-10-CM

## 2021-10-24 DIAGNOSIS — K219 Gastro-esophageal reflux disease without esophagitis: Secondary | ICD-10-CM

## 2021-10-24 DIAGNOSIS — R062 Wheezing: Secondary | ICD-10-CM

## 2021-10-24 DIAGNOSIS — R0689 Other abnormalities of breathing: Secondary | ICD-10-CM

## 2021-10-24 DIAGNOSIS — M5441 Lumbago with sciatica, right side: Secondary | ICD-10-CM

## 2021-10-24 DIAGNOSIS — Z23 Encounter for immunization: Secondary | ICD-10-CM | POA: Insufficient documentation

## 2021-10-24 DIAGNOSIS — M5442 Lumbago with sciatica, left side: Secondary | ICD-10-CM

## 2021-10-24 DIAGNOSIS — G8929 Other chronic pain: Secondary | ICD-10-CM

## 2021-10-24 DIAGNOSIS — R0683 Snoring: Secondary | ICD-10-CM

## 2021-10-24 DIAGNOSIS — E559 Vitamin D deficiency, unspecified: Secondary | ICD-10-CM | POA: Diagnosis not present

## 2021-10-24 DIAGNOSIS — Z860100 Personal history of colon polyps, unspecified: Secondary | ICD-10-CM

## 2021-10-24 DIAGNOSIS — D649 Anemia, unspecified: Secondary | ICD-10-CM | POA: Diagnosis not present

## 2021-10-24 DIAGNOSIS — F3341 Major depressive disorder, recurrent, in partial remission: Secondary | ICD-10-CM

## 2021-10-24 DIAGNOSIS — R739 Hyperglycemia, unspecified: Secondary | ICD-10-CM

## 2021-10-24 DIAGNOSIS — R0989 Other specified symptoms and signs involving the circulatory and respiratory systems: Secondary | ICD-10-CM

## 2021-10-24 HISTORY — DX: Other specified abnormal immunological findings in serum: R76.8

## 2021-10-24 MED ORDER — PANTOPRAZOLE SODIUM 40 MG PO TBEC: 40.0000 mg | DELAYED_RELEASE_TABLET | Freq: Every day | ORAL | 3 refills | Status: DC

## 2021-10-24 MED ORDER — FLUOXETINE HCL 40 MG PO CAPS: 40.0000 mg | ORAL_CAPSULE | Freq: Every day | ORAL | 1 refills | Status: DC

## 2021-10-24 MED ORDER — GABAPENTIN 300 MG PO CAPS: 300.0000 mg | ORAL_CAPSULE | Freq: Four times a day (QID) | ORAL | 2 refills | Status: DC

## 2021-10-24 MED ORDER — MELOXICAM 7.5 MG PO TABS: 7.5000 mg | ORAL_TABLET | Freq: Two times a day (BID) | ORAL | 0 refills | Status: DC

## 2021-10-24 MED ORDER — MONTELUKAST SODIUM 10 MG PO TABS: 10.0000 mg | ORAL_TABLET | Freq: Every day | ORAL | 0 refills | Status: DC

## 2021-10-24 NOTE — Progress Notes (Incomplete)
New Patient Office Visit  Subjective:  Patient ID: [name], female    DOB: 04/06/1975  Age: 46 y.o. MRN: [id number]  CC:  Chief Complaint  Patient presents with  . Establish Care    HPI [name] is here to establish care as a new patient.  Prior provider was: in [facility] Pt is without acute concerns.   Alcohol abuse, was off of it completely for 4-5 years, she still talks to friends with AA.   Pap 2018? She thinks. Last one was negative.  Last tetanus over ten years ago.  Flu vaccine, completed lsast month 9/23.   chronic concerns:  COPD gold II , established with Dr. Melvyn Novas. Last visit 03/02/20, overdue. Was at that time using oxygen at night, states no longer using oxygen at night time. Does wake up a few times at night with sob and coughing. Worse since she started smoking again.   Past Medical History:  Diagnosis Date  . Alcohol abuse   . Anemia 01/31/2011  . Asthma   . COPD (chronic obstructive pulmonary disease) ([place])   . Depression   . Hypoxia 02/01/2011  . Medical history non-contributory   . Needs sleep apnea assessment 2013.07.31  . Tobacco abuse     Past Surgical History:  Procedure Laterality Date  . ANKLE GANGLION CYST EXCISION    . BACK SURGERY    . CESAREAN SECTION    . COLONOSCOPY WITH PROPOFOL N/A 06/27/2016   Procedure: COLONOSCOPY WITH PROPOFOL;  Surgeon: Jonathon Bellows, MD;  Location: [facility] ENDOSCOPY;  Service: Endoscopy;  Laterality: N/A;  . DILATION AND CURETTAGE OF UTERUS    . ESOPHAGOGASTRODUODENOSCOPY (EGD) WITH PROPOFOL N/A 06/27/2016   Procedure: ESOPHAGOGASTRODUODENOSCOPY (EGD) WITH PROPOFOL;  Surgeon: Jonathon Bellows, MD;  Location: [facility] ENDOSCOPY;  Service: Endoscopy;  Laterality: N/A;    Family History  Problem Relation Age of Onset  . Stroke Mother   . Hypothyroidism Mother   . Coronary artery disease Mother   . Throat cancer Mother   . Stroke Maternal Grandmother   . Diabetes Maternal Grandmother    . COPD Paternal Grandmother     Social History   Socioeconomic History  . Marital status: Single    Spouse name: Not on file  . Number of children: Not on file  . Years of education: Not on file  . Highest education level: Not on file  Occupational History  . Not on file  Tobacco Use  . Smoking status: Every Day    Packs/day: 1.00    Years: 25.00    Total pack years: 25.00    Types: Cigarettes  . Smokeless tobacco: Never  . Tobacco comments:    4-5 cigs daily 02/19/20  Vaping Use  . Vaping Use: Some days  Substance and Sexual Activity  . Alcohol use: Yes  . Drug use: Yes    Types: "Crack" cocaine, Marijuana    Comment: crack-Not since September  . Sexual activity: Never    Comment: Homosexual  Other Topics Concern  . Not on file  Social History Narrative  . Not on file   Social Determinants of Health   Financial Resource Strain: Not on file  Food Insecurity: Not on file  Transportation Needs: Not on file  Physical Activity: Not on file  Stress: Not on file  Social Connections: Not on file  Intimate Partner Violence: Not on file    Outpatient Medications Prior to Visit  Medication Sig Dispense Refill  . albuterol (  PROVENTIL) (2.5 MG/3ML) 0.083% nebulizer solution Take 3 mLs (2.5 mg total) by nebulization every 6 (six) hours as needed for wheezing or shortness of breath. For asthma 75 mL 1  . albuterol (VENTOLIN HFA) 108 (90 Base) MCG/ACT inhaler Inhale 1-2 puffs into the lungs every 6 (six) hours as needed for wheezing or shortness of breath. 6.7 g 0  . ALPRAZolam (XANAX) 0.25 MG tablet Take 0.25 mg by mouth 2 (two) times daily as needed for anxiety or sleep.    . budesonide-formoterol (SYMBICORT) 80-4.5 MCG/ACT inhaler Inhale 2 puffs into the lungs 2 (two) times daily.    Marland Kitchen doxycycline (VIBRAMYCIN) 100 MG capsule Take 1 capsule (100 mg total) by mouth 2 (two) times daily. One po bid x 7 days 14 capsule 0  . FLUoxetine (PROZAC) 40 MG capsule Take 40 mg by mouth  daily.    Marland Kitchen gabapentin (NEURONTIN) 300 MG capsule Take 300 mg by mouth 4 (four) times daily as needed (pain).    Marland Kitchen levothyroxine (SYNTHROID) 150 MCG tablet Take 150 mcg by mouth daily.    Marland Kitchen lidocaine (XYLOCAINE) 5 % ointment Apply 1 application topically 3 (three) times daily as needed. 35.44 g 0  . meloxicam (MOBIC) 7.5 MG tablet Take 7.5 mg by mouth 2 (two) times daily.     . montelukast (SINGULAIR) 10 MG tablet Take 1 tablet (10 mg total) by mouth at bedtime. 30 tablet 0  . pantoprazole (PROTONIX) 40 MG tablet Take 40 mg by mouth daily.    Marland Kitchen azithromycin (ZITHROMAX) 250 MG tablet Take 2 tabs day one, then 1 tab daily until complete (Patient not taking: Reported on 10/24/2021) 5 each 0  . Benzocaine-Menthol (CHLORASEPTIC MAX SORE THROAT) 15-10 MG LOZG Use as directed in the mouth or throat. (Patient not taking: Reported on 10/24/2021)    . benzonatate (TESSALON PERLES) 100 MG capsule Take 1 capsule (100 mg total) by mouth 3 (three) times daily as needed for cough. (Patient not taking: Reported on 05/05/2021) 30 capsule 0  . Budeson-Glycopyrrol-Formoterol (BREZTRI AEROSPHERE) 160-9-4.8 MCG/ACT AERO Inhale 2 puffs into the lungs 2 (two) times daily. (Patient not taking: Reported on 05/05/2021) 10.7 g 11  . famotidine (PEPCID) 20 MG tablet One after supper (Patient not taking: Reported on 12/10/2020) 30 tablet 11  . levothyroxine (SYNTHROID, LEVOTHROID) 137 MCG tablet Take 137 mcg by mouth daily. (Patient not taking: Reported on 10/24/2021)  0  . predniSONE (DELTASONE) 20 MG tablet Take by mouth. :TAKE 2 TABLETS BY MOUTH ONCE DAILY FOR 2 DAYS THEN 1 ONCE DAILY FOR 2 DAYS THEN 1/2 (ONE-HALF) ONCE DAILY (Patient not taking: Reported on 05/05/2021)    . predniSONE (DELTASONE) 20 MG tablet Take 2 tablets (40 mg total) by mouth daily with breakfast. (Patient not taking: Reported on 10/24/2021) 10 tablet 0  . predniSONE (DELTASONE) 50 MG tablet Take 1 tablet (50 mg total) by mouth daily with breakfast. (Patient not  taking: Reported on 05/05/2021) 5 tablet 0  . promethazine-dextromethorphan (PROMETHAZINE-DM) 6.25-15 MG/5ML syrup Take 5 mLs by mouth 4 (four) times daily as needed. (Patient not taking: Reported on 10/24/2021) 100 mL 0  . Vitamin D, Ergocalciferol, (DRISDOL) 1.25 MG (50000 UNIT) CAPS capsule Take 50,000 Units by mouth once a week. (Patient not taking: Reported on 10/24/2021)     No facility-administered medications prior to visit.    No Known Allergies  ROS Review of Systems  Review of Systems  Respiratory:  Negative for shortness of breath.   Cardiovascular:  Negative for  chest pain and palpitations.  Gastrointestinal:  Negative for constipation and diarrhea.  Genitourinary:  Negative for dysuria, frequency and urgency.  Musculoskeletal:  Negative for myalgias.  Psychiatric/Behavioral:  Negative for depression and suicidal ideas.   All other systems reviewed and are negative.    Objective:    Physical Exam Constitutional:      General: She is not in acute distress.    Appearance: Normal appearance. She is obese. She is not ill-appearing, toxic-appearing or diaphoretic.  HENT:     Right Ear: Tympanic membrane normal.     Left Ear: Tympanic membrane normal.     Nose: Nose normal. No congestion or rhinorrhea.     Right Turbinates: Not enlarged or swollen.     Left Turbinates: Not enlarged or swollen.     Right Sinus: No maxillary sinus tenderness or frontal sinus tenderness.     Left Sinus: No maxillary sinus tenderness or frontal sinus tenderness.     Mouth/Throat:     Mouth: Mucous membranes are moist.     Pharynx: No pharyngeal swelling, oropharyngeal exudate or posterior oropharyngeal erythema.     Tonsils: No tonsillar exudate.  Eyes:     Extraocular Movements: Extraocular movements intact.     Conjunctiva/sclera: Conjunctivae normal.     Pupils: Pupils are equal, round, and reactive to light.  Neck:     Thyroid: No thyroid mass.  Cardiovascular:     Rate and Rhythm:  Normal rate and regular rhythm.  Pulmonary:     Effort: Pulmonary effort is normal.     Breath sounds: Normal breath sounds.  Lymphadenopathy:     Cervical:     Right cervical: No superficial cervical adenopathy.    Left cervical: No superficial cervical adenopathy.  Neurological:     Mental Status: She is alert.    Gen: NAD, resting comfortably CV: RRR with no murmurs appreciated Pulm: NWOB, CTAB with no crackles, wheezes, or rhonchi Skin: warm, dry Psych: Normal affect and thought content  BP 98/68   Pulse 76   Temp 98.6 F (37 C)   Resp 16   Ht 5' 4.25" (1.632 m)   Wt 211 lb 8 oz (95.9 kg)   LMP 10/03/2021 (Approximate)   SpO2 96%   BMI 36.02 kg/m  Wt Readings from Last 3 Encounters:  10/24/21 211 lb 8 oz (95.9 kg)  05/05/21 201 lb (91.2 kg)  01/15/21 200 lb (90.7 kg)     Health Maintenance Due  Topic Date Due  . Hepatitis C Screening  Never done  . TETANUS/TDAP  Never done  . PAP SMEAR-Modifier  Never done  . COVID-19 Vaccine (2 - Booster for [employer] series) 09/15/2019  . INFLUENZA VACCINE  09/13/2021    There are no preventive care reminders to display for this patient.  Lab Results  Component Value Date   TSH 20.5 (H) 05/10/2013   Lab Results  Component Value Date   WBC 5.0 05/05/2021   HGB 10.3 (L) 05/05/2021   HCT 35.7 (L) 05/05/2021   MCV 78.6 (L) 05/05/2021   PLT 305 05/05/2021   Lab Results  Component Value Date   NA 136 05/05/2021   K 3.7 05/05/2021   CO2 25 05/05/2021   GLUCOSE 105 (H) 05/05/2021   BUN 12 05/05/2021   CREATININE 0.65 05/05/2021   BILITOT 0.5 01/15/2021   ALKPHOS 57 01/15/2021   AST 24 01/15/2021   ALT 18 01/15/2021   PROT 7.0 01/15/2021   ALBUMIN 4.1 01/15/2021  CALCIUM 9.3 05/05/2021   ANIONGAP 8 05/05/2021   No results found for: "CHOL" No results found for: "HDL" No results found for: "LDLCALC" No results found for: "TRIG" No results found for: "CHOLHDL" No results found for: "HGBA1C"    Assessment &  Plan:   Problem List Items Addressed This Visit   None   No orders of the defined types were placed in this encounter.   Follow-up: No follow-ups on file.    Eugenia Pancoast, FNP

## 2021-10-24 NOTE — Patient Instructions (Addendum)
Complete xray(s) prior to leaving today. I will notify you of your results once received.  Your imaging for ultrasound of carotid arteries as well as a stat venous doppler u/s of bil lower ext Has been ordered at the following location: Please call them to schedule.   [facility] outpatient imaging center [address], [place] [address] Phone [phone]-  8-5 pm   A referral was placed today for pulmonary, for snoring and gasping for breath. Let's assess for your COPD and maybe retest sleep apnea.  Please let us know if you have not heard back within 2 weeks about the referral.  A referral was placed today to repeat your colonoscopy with GI.  Please let us know if you have not heard back within 2 weeks about the referral.  Welcome to our clinic, I am happy to have you as my new patient. I am excited to continue on this healthcare journey with you.  Stop by the lab prior to leaving today. I will notify you of your results once received.   Please keep in mind Any my chart messages you send have up to a three business day turnaround for a response.  Phone calls may take up to a one full business day turnaround for a  response.   If you need a medication refill I recommend you request it through the pharmacy as this is easiest for [place] rather than sending a message and or phone call.   Due to recent changes in healthcare laws, you may see results of your imaging and/or laboratory studies on MyChart before I have had a chance to review them.  I understand that in some cases there may be results that are confusing or concerning to you. Please understand that not all results are received at the same time and often I may need to interpret multiple results in order to provide you with the best plan of care or course of treatment. Therefore, I ask that you please give me 2 business days to thoroughly review all your results before contacting my office for  clarification. Should we see a critical lab result, you will be contacted sooner.   It was a pleasure seeing you today! Please do not hesitate to reach out with any questions and or concerns.  Regards,   Eugenia Pancoast FNP-C

## 2021-10-25 ENCOUNTER — Encounter: Payer: Self-pay | Admitting: Family

## 2021-10-25 ENCOUNTER — Other Ambulatory Visit: Payer: Self-pay | Admitting: Family

## 2021-10-25 DIAGNOSIS — M5451 Vertebrogenic low back pain: Secondary | ICD-10-CM | POA: Diagnosis not present

## 2021-10-25 DIAGNOSIS — M25572 Pain in left ankle and joints of left foot: Secondary | ICD-10-CM | POA: Insufficient documentation

## 2021-10-25 DIAGNOSIS — M5136 Other intervertebral disc degeneration, lumbar region: Secondary | ICD-10-CM | POA: Diagnosis not present

## 2021-10-25 DIAGNOSIS — J189 Pneumonia, unspecified organism: Secondary | ICD-10-CM

## 2021-10-25 DIAGNOSIS — M25472 Effusion, left ankle: Secondary | ICD-10-CM | POA: Insufficient documentation

## 2021-10-25 LAB — CBC WITH DIFFERENTIAL/PLATELET
Basophils Absolute: 0 10*3/uL (ref 0.0–0.1)
Basophils Relative: 0.7 % (ref 0.0–3.0)
Eosinophils Absolute: 0.4 10*3/uL (ref 0.0–0.7)
Eosinophils Relative: 6.7 % — ABNORMAL HIGH (ref 0.0–5.0)
HCT: 35.1 % — ABNORMAL LOW (ref 36.0–46.0)
Hemoglobin: 11.1 g/dL — ABNORMAL LOW (ref 12.0–15.0)
Lymphocytes Relative: 43.6 % (ref 12.0–46.0)
Lymphs Abs: 2.9 10*3/uL (ref 0.7–4.0)
MCHC: 31.8 g/dL (ref 30.0–36.0)
MCV: 75.3 fl — ABNORMAL LOW (ref 78.0–100.0)
Monocytes Absolute: 0.4 10*3/uL (ref 0.1–1.0)
Monocytes Relative: 6.2 % (ref 3.0–12.0)
Neutro Abs: 2.8 10*3/uL (ref 1.4–7.7)
Neutrophils Relative %: 42.8 % — ABNORMAL LOW (ref 43.0–77.0)
Platelets: 305 10*3/uL (ref 150.0–400.0)
RBC: 4.66 Mil/uL (ref 3.87–5.11)
RDW: 17.4 % — ABNORMAL HIGH (ref 11.5–15.5)
WBC: 6.6 10*3/uL (ref 4.0–10.5)

## 2021-10-25 LAB — COMPREHENSIVE METABOLIC PANEL
ALT: 12 U/L (ref 0–35)
AST: 13 U/L (ref 0–37)
Albumin: 4.3 g/dL (ref 3.5–5.2)
Alkaline Phosphatase: 67 U/L (ref 39–117)
BUN: 12 mg/dL (ref 6–23)
CO2: 27 mEq/L (ref 19–32)
Calcium: 9.7 mg/dL (ref 8.4–10.5)
Chloride: 102 mEq/L (ref 96–112)
Creatinine, Ser: 0.78 mg/dL (ref 0.40–1.20)
GFR: 90.6 mL/min (ref >60.00)
Glucose, Bld: 82 mg/dL (ref 70–99)
Potassium: 4.2 mEq/L (ref 3.5–5.1)
Sodium: 138 mEq/L (ref 135–145)
Total Bilirubin: 0.5 mg/dL (ref 0.2–1.2)
Total Protein: 7.2 g/dL (ref 6.0–8.3)

## 2021-10-25 LAB — LIPID PANEL
Cholesterol: 190 mg/dL (ref 0–200)
HDL: 67.6 mg/dL (ref >39.00)
LDL Cholesterol: 103 mg/dL — ABNORMAL HIGH (ref 0–99)
NonHDL: 122.51
Total CHOL/HDL Ratio: 3
Triglycerides: 98 mg/dL (ref 0.0–149.0)
VLDL: 19.6 mg/dL (ref 0.0–40.0)

## 2021-10-25 LAB — IBC + FERRITIN
Ferritin: 3 ng/mL — ABNORMAL LOW (ref 10.0–291.0)
Iron: 32 ug/dL — ABNORMAL LOW (ref 42–145)
Saturation Ratios: 5.8 % — ABNORMAL LOW (ref 20.0–50.0)
TIBC: 548.8 ug/dL — ABNORMAL HIGH (ref 250.0–450.0)
Transferrin: 392 mg/dL — ABNORMAL HIGH (ref 212.0–360.0)

## 2021-10-25 LAB — TSH: TSH: 6.11 u[IU]/mL — ABNORMAL HIGH (ref 0.35–5.50)

## 2021-10-25 LAB — HEMOGLOBIN A1C: Hgb A1c MFr Bld: 5.5 % (ref 4.6–6.5)

## 2021-10-25 LAB — VITAMIN D 25 HYDROXY (VIT D DEFICIENCY, FRACTURES): VITD: 15.67 ng/mL — ABNORMAL LOW (ref 30.00–100.00)

## 2021-10-25 MED ORDER — AZITHROMYCIN 250 MG PO TABS: ORAL_TABLET | ORAL | 0 refills | Status: AC

## 2021-10-25 MED ORDER — AMOXICILLIN-POT CLAVULANATE 875-125 MG PO TABS: 1.0000 | ORAL_TABLET | Freq: Two times a day (BID) | ORAL | 0 refills | Status: DC

## 2021-10-26 ENCOUNTER — Encounter: Payer: Self-pay | Admitting: Family

## 2021-10-26 ENCOUNTER — Other Ambulatory Visit: Payer: Self-pay | Admitting: Family

## 2021-10-26 DIAGNOSIS — R7989 Other specified abnormal findings of blood chemistry: Secondary | ICD-10-CM

## 2021-10-26 DIAGNOSIS — E039 Hypothyroidism, unspecified: Secondary | ICD-10-CM

## 2021-10-26 NOTE — Assessment & Plan Note (Signed)
Lipid panel ordered pending results.   

## 2021-10-26 NOTE — Assessment & Plan Note (Signed)
Refill given for pantoprazole 40 mg once daily Try to decrease and or avoid spicy foods, fried fatty foods, and also caffeine and chocolate as these can increase heartburn symptoms.

## 2021-10-26 NOTE — Assessment & Plan Note (Signed)
Repeat hepatitis C antibody test today pending results

## 2021-10-26 NOTE — Assessment & Plan Note (Signed)
Ordering CBC pending results

## 2021-10-26 NOTE — Assessment & Plan Note (Signed)
Possible sleep apnea referring to pulmonary

## 2021-10-26 NOTE — Assessment & Plan Note (Signed)
Chest x-ray today pending results

## 2021-10-26 NOTE — Assessment & Plan Note (Signed)
Pt advised of the following: Work on a diabetic diet, try to incorporate exercise at least 20-30 a day for 3 days a week or more.  Ordering hga1c pending results

## 2021-10-26 NOTE — Assessment & Plan Note (Signed)
Continue 150 mcg dose ordering TSH pending results

## 2021-10-26 NOTE — Assessment & Plan Note (Signed)
Ordered vitamin d pending results.   

## 2021-10-26 NOTE — Assessment & Plan Note (Addendum)
Ordering x-ray of left ankle pending results Also going to rule out DVT with ultrasound of lower extremity  Extensive complaints and review of acute and chronic illness looking over past lab history, also reviewing past ER visits and consults with pulmonary in the past which totaled approximately 70-minutes in clinic.

## 2021-10-26 NOTE — Assessment & Plan Note (Signed)
Ongoing Will order x-ray of the left ankle at this time pending results

## 2021-10-26 NOTE — Assessment & Plan Note (Signed)
Possible sleep apnea referring patient to pulmonary for both work-up and evaluation on state with COPD and also probable possible sleep study

## 2021-10-26 NOTE — Assessment & Plan Note (Signed)
Still smoking discussed cessation patient not ready to quit right now.  Did refer back to pulmonary as patient is overdue to get back to that for an appointment.  Continue on inhaler at current.  With wheezing today ordering a chest x-ray pending results.

## 2021-10-26 NOTE — Assessment & Plan Note (Signed)
Wheezing heard on auscultation in the carotid area.  Ordering ultrasound of bilateral carotid arteries.

## 2021-10-26 NOTE — Assessment & Plan Note (Signed)
Referred to GI for colonoscopy 

## 2021-10-26 NOTE — Progress Notes (Signed)
Your hepatitis C antibody was positive this is waiting for confirmation test called HCVRNA to determine if this is a current active infection a past exposure and/or immunity.  I will let you know when I get more information  Your iron still continues to be pretty low and your storage of iron is extremely low.  Are you taking daily iron?  If you are not I highly suggest that you start over-the-counter iron.  This could be causing extreme fatigue.  Patient plan to follow-up in 1 month and we will repeat these levels.    Eosinophils are slightly elevated but this is not unexpected as you are having lung exacerbations with allergies.  Vitamin D is also extremely low I will send in a prescription dosing that you will take for 2 months once complete I want you to take over-the-counter vitamin D 2000 once daily indefinitely.  Otherwise your vitamin D will only continue to drop.  This can also contribute to fatigue  Cholesterol is pretty good continue to work on low-cholesterol diet  Thyroid is a bit elevated has this occurred in the past?  With your fatigue I am going to add on 2 different markers and we will see how this comes back.  We might consider starting you on medication for this and it might help with fatigue  If you are not prediabetic

## 2021-10-26 NOTE — Assessment & Plan Note (Signed)
Smoking cessation instruction/counseling given:  counseled patient on the dangers of tobacco use, advised patient to stop smoking, and reviewed strategies to maximize success 

## 2021-10-27 ENCOUNTER — Encounter: Payer: Self-pay | Admitting: Family

## 2021-10-27 ENCOUNTER — Other Ambulatory Visit: Payer: Self-pay

## 2021-10-27 ENCOUNTER — Telehealth: Payer: Self-pay

## 2021-10-27 DIAGNOSIS — Z86010 Personal history of colonic polyps: Secondary | ICD-10-CM

## 2021-10-27 LAB — HEPATITIS C ANTIBODY: Hepatitis C Ab: REACTIVE — AB

## 2021-10-27 LAB — HCV RNA,QUANTITATIVE REAL TIME PCR
HCV Quantitative Log: <1.18 Log IU/mL
HCV RNA, PCR, QN: <15 IU/mL

## 2021-10-27 MED ORDER — GOLYTELY 236 G PO SOLR: 4000.0000 mL | Freq: Once | ORAL | 0 refills | Status: AC

## 2021-10-27 MED ORDER — LEVOTHYROXINE SODIUM 175 MCG PO TABS: 175.0000 ug | ORAL_TABLET | Freq: Every day | ORAL | 1 refills | Status: DC

## 2021-10-27 NOTE — Telephone Encounter (Signed)
Gastroenterology Pre-Procedure Review  Request Date: 11/28/2021 Requesting Physician: Dr. Vicente Males  PATIENT REVIEW QUESTIONS: The patient responded to the following health history questions as indicated:    1. Are you having any GI issues? yes (stomach pains, below navel left side, sharp, burning pain sometimes only when she eats) 2. Do you have a personal history of Polyps? yes (removed in 2018) 3. Do you have a family history of Colon Cancer or Polyps? no 4. Diabetes Mellitus? no 5. Joint replacements in the past 12 months?no 6. Major health problems in the past 3 months?no 7. Any artificial heart valves, MVP, or defibrillator?no    MEDICATIONS & ALLERGIES:    Patient reports the following regarding taking any anticoagulation/antiplatelet therapy:   Plavix, Coumadin, Eliquis, Xarelto, Lovenox, Pradaxa, Brilinta, or Effient? no Aspirin? no  Patient confirms/reports the following medications:  Current Outpatient Medications  Medication Sig Dispense Refill   albuterol (PROVENTIL) (2.5 MG/3ML) 0.083% nebulizer solution Take 3 mLs (2.5 mg total) by nebulization every 6 (six) hours as needed for wheezing or shortness of breath. For asthma 75 mL 1   albuterol (VENTOLIN HFA) 108 (90 Base) MCG/ACT inhaler Inhale 1-2 puffs into the lungs every 6 (six) hours as needed for wheezing or shortness of breath. 6.7 g 0   ALPRAZolam (XANAX) 0.25 MG tablet Take 0.25 mg by mouth 2 (two) times daily as needed for anxiety or sleep.     amoxicillin-clavulanate (AUGMENTIN) 875-125 MG tablet Take 1 tablet by mouth 2 (two) times daily. 20 tablet 0   azithromycin (ZITHROMAX) 250 MG tablet Take 2 tablets on day 1, then 1 tablet daily on days 2 through 5 6 tablet 0   budesonide-formoterol (SYMBICORT) 80-4.5 MCG/ACT inhaler Inhale 2 puffs into the lungs 2 (two) times daily.     FLUoxetine (PROZAC) 40 MG capsule Take 1 capsule (40 mg total) by mouth daily. 90 capsule 1   gabapentin (NEURONTIN) 300 MG capsule Take 1  capsule (300 mg total) by mouth 4 (four) times daily. 120 capsule 2   levothyroxine (SYNTHROID) 175 MCG tablet Take 1 tablet (175 mcg total) by mouth daily. 30 tablet 1   lidocaine (XYLOCAINE) 5 % ointment Apply 1 application topically 3 (three) times daily as needed. 35.44 g 0   meloxicam (MOBIC) 7.5 MG tablet Take 1 tablet (7.5 mg total) by mouth 2 (two) times daily. 180 tablet 0   montelukast (SINGULAIR) 10 MG tablet Take 1 tablet (10 mg total) by mouth at bedtime. 30 tablet 0   pantoprazole (PROTONIX) 40 MG tablet Take 1 tablet (40 mg total) by mouth daily. 90 tablet 3   No current facility-administered medications for this visit.    Patient confirms/reports the following allergies:  No Known Allergies  No orders of the defined types were placed in this encounter.   AUTHORIZATION INFORMATION Primary Insurance: 1D#: Group #:  Secondary Insurance: 1D#: Group #:  SCHEDULE INFORMATION: Date: 11/28/2021 Time: Location: [facility]

## 2021-10-27 NOTE — Progress Notes (Signed)
Perfect thank you for scheduling the video conference

## 2021-10-28 ENCOUNTER — Encounter: Payer: Self-pay | Admitting: Family

## 2021-10-28 ENCOUNTER — Telehealth (INDEPENDENT_AMBULATORY_CARE_PROVIDER_SITE_OTHER): Payer: BC Managed Care – PPO | Admitting: Family

## 2021-10-28 VITALS — Ht 64.25 in | Wt 211.5 lb

## 2021-10-28 DIAGNOSIS — E039 Hypothyroidism, unspecified: Secondary | ICD-10-CM | POA: Diagnosis not present

## 2021-10-28 DIAGNOSIS — D649 Anemia, unspecified: Secondary | ICD-10-CM

## 2021-10-28 DIAGNOSIS — E559 Vitamin D deficiency, unspecified: Secondary | ICD-10-CM

## 2021-10-28 DIAGNOSIS — J209 Acute bronchitis, unspecified: Secondary | ICD-10-CM | POA: Diagnosis not present

## 2021-10-28 DIAGNOSIS — R768 Other specified abnormal immunological findings in serum: Secondary | ICD-10-CM

## 2021-10-28 MED ORDER — VITAMIN D (ERGOCALCIFEROL) 1.25 MG (50000 UNIT) PO CAPS: 50000.0000 [IU] | ORAL_CAPSULE | ORAL | 0 refills | Status: AC

## 2021-10-28 NOTE — Assessment & Plan Note (Signed)
Advised patient to continue daily iron if needed she can do a daily stool softener as well if she experiences constipation

## 2021-10-28 NOTE — Assessment & Plan Note (Signed)
Patient states that wheezing has already improved and she is feeling better day by day since starting the medication.

## 2021-10-28 NOTE — Progress Notes (Signed)
MyChart Video Visit    Virtual Visit via Video Note   This visit type was conducted due to national recommendations for restrictions regarding the COVID-19 Pandemic (e.g. social distancing) in an effort to limit this patient's exposure and mitigate transmission in our community. This patient is at least at moderate risk for complications without adequate follow up. This format is felt to be most appropriate for this patient at this time. Physical exam was limited by quality of the video and audio technology used for the visit. CMA was able to get the patient set up on a video visit.  Patient location: Home. Patient and provider in visit Provider location: Office  I discussed the limitations of evaluation and management by telemedicine and the availability of in person appointments. The patient expressed understanding and agreed to proceed.  Visit Date: 10/28/2021  Today's healthcare provider: Eugenia Pancoast, FNP     Subjective:    Patient ID: [name], female    DOB: Mar 25, 1975, 46 y.o.   MRN: [id number]  Chief Complaint  Patient presents with   Medication Screening    HPI  Pt here today via video visit with concerns.   IDA: ferritin very low, pt with fatigue. Taking one pill daily for ferrous sulfate. Does state that at times she has dizziness and fatigue, will see if this helps. Still get periods, and they are pretty heavy.   Hypothyroid: does feel fatigue, has dry skin, and also some weight concerns. No thinning hair. Increased levothyroxine to 175 mcg once daily.   Past Medical History:  Diagnosis Date   Alcohol abuse    Anemia 01/31/2011   Asthma    COPD (chronic obstructive pulmonary disease) (HCC)    Depression    Hepatitis C 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 10/24/2021   RNA negative.  Hep C false positive.     Hypoxia 02/01/2011   Medical history non-contributory    Needs sleep apnea assessment 2013.07.31   Tobacco abuse     Past Surgical History:   Procedure Laterality Date   ANKLE GANGLION CYST EXCISION     BACK SURGERY     CESAREAN SECTION     COLONOSCOPY WITH PROPOFOL N/A 06/27/2016   Procedure: COLONOSCOPY WITH PROPOFOL;  Surgeon: Jonathon Bellows, MD;  Location: [facility] ENDOSCOPY;  Service: Endoscopy;  Laterality: N/A;   DILATION AND CURETTAGE OF UTERUS     ESOPHAGOGASTRODUODENOSCOPY (EGD) WITH PROPOFOL N/A 06/27/2016   Procedure: ESOPHAGOGASTRODUODENOSCOPY (EGD) WITH PROPOFOL;  Surgeon: Jonathon Bellows, MD;  Location: [facility] ENDOSCOPY;  Service: Endoscopy;  Laterality: N/A;    Family History  Problem Relation Age of Onset   Stroke Mother    Hypothyroidism Mother    Coronary artery disease Mother    Throat cancer Mother    Stroke Maternal Grandmother    Diabetes Maternal Grandmother    COPD Paternal Grandmother     Social History   Socioeconomic History   Marital status: Single    Spouse name: Not on file   Number of children: 1   Years of education: Not on file   Highest education level: Not on file  Occupational History    Employer: [employer]  Tobacco Use   Smoking status: Every Day    Packs/day: 1.00    Years: 25.00    Total pack years: 25.00    Types: Cigarettes   Smokeless tobacco: Never   Tobacco comments:    4-5 cigs daily 02/19/20  Vaping Use   Vaping Use: Former  Substance  and Sexual Activity   Alcohol use: Yes   Drug use: Not Currently    Types: Marijuana, Cocaine    Comment: last >2 y/o   Sexual activity: Not Currently    Partners: Female    Comment: Homosexual  Other Topics Concern   Not on file  Social History Narrative   Son, 25 y/o    Social Determinants of Health   Financial Resource Strain: Not on file  Food Insecurity: Not on file  Transportation Needs: Not on file  Physical Activity: Not on file  Stress: Not on file  Social Connections: Not on file  Intimate Partner Violence: Not on file    Outpatient Medications Prior to Visit  Medication Sig Dispense Refill   albuterol  (PROVENTIL) (2.5 MG/3ML) 0.083% nebulizer solution Take 3 mLs (2.5 mg total) by nebulization every 6 (six) hours as needed for wheezing or shortness of breath. For asthma 75 mL 1   albuterol (VENTOLIN HFA) 108 (90 Base) MCG/ACT inhaler Inhale 1-2 puffs into the lungs every 6 (six) hours as needed for wheezing or shortness of breath. 6.7 g 0   ALPRAZolam (XANAX) 0.25 MG tablet Take 0.25 mg by mouth 2 (two) times daily as needed for anxiety or sleep.     amoxicillin-clavulanate (AUGMENTIN) 875-125 MG tablet Take 1 tablet by mouth 2 (two) times daily. 20 tablet 0   azithromycin (ZITHROMAX) 250 MG tablet Take 2 tablets on day 1, then 1 tablet daily on days 2 through 5 6 tablet 0   budesonide-formoterol (SYMBICORT) 80-4.5 MCG/ACT inhaler Inhale 2 puffs into the lungs 2 (two) times daily.     FLUoxetine (PROZAC) 40 MG capsule Take 1 capsule (40 mg total) by mouth daily. 90 capsule 1   gabapentin (NEURONTIN) 300 MG capsule Take 1 capsule (300 mg total) by mouth 4 (four) times daily. 120 capsule 2   levothyroxine (SYNTHROID) 175 MCG tablet Take 1 tablet (175 mcg total) by mouth daily. 30 tablet 1   lidocaine (XYLOCAINE) 5 % ointment Apply 1 application topically 3 (three) times daily as needed. 35.44 g 0   meloxicam (MOBIC) 7.5 MG tablet Take 1 tablet (7.5 mg total) by mouth 2 (two) times daily. 180 tablet 0   montelukast (SINGULAIR) 10 MG tablet Take 1 tablet (10 mg total) by mouth at bedtime. 30 tablet 0   pantoprazole (PROTONIX) 40 MG tablet Take 1 tablet (40 mg total) by mouth daily. 90 tablet 3   No facility-administered medications prior to visit.    No Known Allergies       Objective:    Physical Exam Constitutional:      Appearance: Normal appearance. She is obese.  Pulmonary:     Effort: Pulmonary effort is normal.  Neurological:     General: No focal deficit present.     Mental Status: She is alert and oriented to person, place, and time. Mental status is at baseline.  Psychiatric:         Mood and Affect: Mood normal.        Behavior: Behavior normal.        Thought Content: Thought content normal.        Judgment: Judgment normal.     Ht 5' 4.25" (1.632 m)   Wt 211 lb 8 oz (95.9 kg)   LMP 10/03/2021 (Approximate)   BMI 36.02 kg/m  Wt Readings from Last 3 Encounters:  10/28/21 211 lb 8 oz (95.9 kg)  10/24/21 211 lb 8 oz (95.9 kg)  05/05/21  201 lb (91.2 kg)       Assessment & Plan:   Problem List Items Addressed This Visit       Respiratory   Acute bronchitis    Patient states that wheezing has already improved and she is feeling better day by day since starting the medication.        Endocrine   Hypothyroidism    Patient has increased to 175 mcg Synthroid once daily.  Patient to continue.  Advised patient to repeat thyroid level at her follow-up that is already scheduled.        Other   Anemia (Chronic)    Advised patient to continue daily iron if needed she can do a daily stool softener as well if she experiences constipation      Vitamin D deficiency - Primary   Relevant Medications   Vitamin D, Ergocalciferol, (DRISDOL) 1.25 MG (50000 UNIT) CAPS capsule   Hepatitis C 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    Discussed with patient that this is a false positive.  The RNA was negative.  This is not an active infection       I am having [name] start on Vitamin D (Ergocalciferol). I am also having her maintain her albuterol, lidocaine, ALPRAZolam, albuterol, budesonide-formoterol, montelukast, pantoprazole, meloxicam, FLUoxetine, gabapentin, azithromycin, amoxicillin-clavulanate, and levothyroxine.  Meds ordered this encounter  Medications   Vitamin D, Ergocalciferol, (DRISDOL) 1.25 MG (50000 UNIT) CAPS capsule    Sig: Take 1 capsule (50,000 Units total) by mouth every 7 (seven) days for 8 doses.    Dispense:  8 capsule    Refill:  0    Order Specific Question:   Supervising Provider    Answer:   BEDSOLE, AMY E [[id number]]    I discussed  the assessment and treatment plan with the patient. The patient was provided an opportunity to ask questions and all were answered. The patient agreed with the plan and demonstrated an understanding of the instructions.   The patient was advised to call back or seek an in-person evaluation if the symptoms worsen or if the condition fails to improve as anticipated.  I provided 15 minutes of face-to-face time during this encounter.   Eugenia Pancoast, [place] [facility] [place] [phone] (phone) [phone] (fax)  [place]

## 2021-10-28 NOTE — Assessment & Plan Note (Signed)
Discussed with patient that this is a false positive.  The RNA was negative.  This is not an active infection

## 2021-10-28 NOTE — Assessment & Plan Note (Signed)
Patient has increased to 175 mcg Synthroid once daily.  Patient to continue.  Advised patient to repeat thyroid level at her follow-up that is already scheduled.

## 2021-10-31 ENCOUNTER — Ambulatory Visit: Payer: BC Managed Care – PPO | Admitting: Primary Care

## 2021-10-31 NOTE — Progress Notes (Deleted)
$'@Patient'n$  ID: [name], female    DOB: 1975-02-20, 46 y.o.   MRN: [id number]  No chief complaint on file.   Referring provider: Eugenia Pancoast, FNP  HPI: 46 year old female, current everyday smoker (25 pack year hx). PMH significant for COPD GOLD II, CAP, acute respiratory failure, hypothyroidism, alcohol dependence, major depression, anemia, tobacco abuse. Patient of Dr. Melvyn Novas, last seen by pulmonary NP on 02/19/20.  Previous [facility] pulmonary encounter: 02/19/2020  46 year old female former smoker followed in our office for COPD.  She is established with Dr. Melvyn Novas.  She was last seen in December/2021.  She is a previous [facility] patient.  At last visit in December/2021 she was status post hospitalization in September/2021.  Patient is presenting today after completing pulmonary function testing.  Since last being seen patient was seen in urgent care on 01/22/2020 for chronic pain in both shoulders.  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  She reports that she utilizes OSHA mask guidelines.  There is some dust, as well as smoking and heat in the job that she does.  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  She does feel that she works in a fairly open area.  Patient does not have recent CT imaging of her chest.  Prior to working as a Glass blower/designer she is to work in [employer].  Patient is currently in the process of trying to obtain a license.  She had a DUI when she was younger about 2 decades ago.  She is presenting today with the [facility] ignition interlock medical accommodation form to be completed.  03/02/2020 Patient presents today for 1 week follow-up with Spirometry. Maintained on Breztri. Uses oxygen at night. Received flu vaccine last week. She has shortness of breath with activities. She is compliant with Breztri two puffs twice daily. She requires her albuterol rescue in 2-3 times a day. No acute symptoms today.  She tested positive for COVID 2 weeks ago at an [place] care, states that she was treated with Vit C, zithromax and prednisone. She did not require hospitalization and has no residual symptoms. She is looking to obtain her drivers license. She got a DUI over 20 years ago. Reports that she has been sober for 4 years. Needing two spirometry tests to show that she is unable to use interlock system. Denies chest tightness, wheezing or cough.   10/31/2021- Interim hx  Patient presents today for overdue follow-up/COPD.           Tests:    01/16/2020-CBC with differential-eosinophils relative 12, eosinophils absolute 0.8   01/16/2020-IgE-62   01/16/2020-chest x-ray-no active cardiopulmonary disease   02/19/2020-pulmonary function test-FVC 2.35 (62% predicted), postbronchodilator ratio 67, postbronchodilator FEV1 1.85 (61% addicted), positive bronchodilator response, TLC 5.5 (105% addicted), DLCO 7.86 (35% predicted)   03/06/2018-alpha-1-143, PI*MM  02/19/2020-pulmonary function test-FVC 2.35 (62% predicted), postbronchodilator ratio 67, postbronchodilator FEV1 1.85 (61% addicted), positive bronchodilator response, TLC 5.5 (105% addicted), DLCO 7.86 (35% predicted)   No Known Allergies  Immunization History  Administered Date(s) Administered   Influenza Inj Mdck Quad With Preservative 09/26/2021   Influenza,inj,Quad PF,6+ Mos 11/12/2014, 02/13/2016, 12/07/2016, 02/19/2020   Janssen (J&J) SARS-COV-2 Vaccination 07/21/2019    Past Medical History:  Diagnosis Date   Alcohol abuse    Anemia 01/31/2011   Asthma    COPD (chronic obstructive pulmonary disease) ([place])    Depression    Hepatitis C 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 10/24/2021  RNA negative.  Hep C false positive.     Hypoxia 02/01/2011   Medical history non-contributory    Needs sleep apnea assessment 2013.07.31   Tobacco abuse     Tobacco History: Social History   Tobacco Use  Smoking Status Every Day   Packs/day:  1.00   Years: 25.00   Total pack years: 25.00   Types: Cigarettes  Smokeless Tobacco Never  Tobacco Comments   4-5 cigs daily 02/19/20   Ready to quit: Not Answered Counseling given: Not Answered Tobacco comments: 4-5 cigs daily 02/19/20   Outpatient Medications Prior to Visit  Medication Sig Dispense Refill   albuterol (PROVENTIL) (2.5 MG/3ML) 0.083% nebulizer solution Take 3 mLs (2.5 mg total) by nebulization every 6 (six) hours as needed for wheezing or shortness of breath. For asthma 75 mL 1   albuterol (VENTOLIN HFA) 108 (90 Base) MCG/ACT inhaler Inhale 1-2 puffs into the lungs every 6 (six) hours as needed for wheezing or shortness of breath. 6.7 g 0   ALPRAZolam (XANAX) 0.25 MG tablet Take 0.25 mg by mouth 2 (two) times daily as needed for anxiety or sleep.     amoxicillin-clavulanate (AUGMENTIN) 875-125 MG tablet Take 1 tablet by mouth 2 (two) times daily. 20 tablet 0   budesonide-formoterol (SYMBICORT) 80-4.5 MCG/ACT inhaler Inhale 2 puffs into the lungs 2 (two) times daily.     FLUoxetine (PROZAC) 40 MG capsule Take 1 capsule (40 mg total) by mouth daily. 90 capsule 1   gabapentin (NEURONTIN) 300 MG capsule Take 1 capsule (300 mg total) by mouth 4 (four) times daily. 120 capsule 2   levothyroxine (SYNTHROID) 175 MCG tablet Take 1 tablet (175 mcg total) by mouth daily. 30 tablet 1   lidocaine (XYLOCAINE) 5 % ointment Apply 1 application topically 3 (three) times daily as needed. 35.44 g 0   meloxicam (MOBIC) 7.5 MG tablet Take 1 tablet (7.5 mg total) by mouth 2 (two) times daily. 180 tablet 0   montelukast (SINGULAIR) 10 MG tablet Take 1 tablet (10 mg total) by mouth at bedtime. 30 tablet 0   pantoprazole (PROTONIX) 40 MG tablet Take 1 tablet (40 mg total) by mouth daily. 90 tablet 3   Vitamin D, Ergocalciferol, (DRISDOL) 1.25 MG (50000 UNIT) CAPS capsule Take 1 capsule (50,000 Units total) by mouth every 7 (seven) days for 8 doses. 8 capsule 0   No facility-administered medications  prior to visit.      Review of Systems  Review of Systems   Physical Exam  LMP 10/03/2021 (Approximate)  Physical Exam   Lab Results:  CBC    Component Value Date/Time   WBC 6.6 10/24/2021 1555   RBC 4.66 10/24/2021 1555   HGB 11.1 (L) 10/24/2021 1555   HGB 11.6 (L) 05/13/2013 0449   HCT 35.1 (L) 10/24/2021 1555   HCT 36.2 05/13/2013 0449   PLT 305.0 10/24/2021 1555   PLT 264 05/13/2013 0449   MCV 75.3 (L) 10/24/2021 1555   MCV 82 05/13/2013 0449   MCH 22.7 (L) 05/05/2021 1105   MCHC 31.8 10/24/2021 1555   RDW 17.4 (H) 10/24/2021 1555   RDW 15.3 (H) 05/13/2013 0449   LYMPHSABS 2.9 10/24/2021 1555   LYMPHSABS 2.4 05/13/2013 0449   MONOABS 0.4 10/24/2021 1555   MONOABS 0.9 05/13/2013 0449   EOSABS 0.4 10/24/2021 1555   EOSABS 0.0 05/13/2013 0449   BASOSABS 0.0 10/24/2021 1555   BASOSABS 0.0 05/13/2013 0449    BMET    Component Value Date/Time  NA 138 10/24/2021 1555   NA 136 05/11/2013 0341   K 4.2 10/24/2021 1555   K 4.1 05/11/2013 0341   CL 102 10/24/2021 1555   CL 106 05/11/2013 0341   CO2 27 10/24/2021 1555   CO2 27 05/11/2013 0341   GLUCOSE 82 10/24/2021 1555   GLUCOSE 143 (H) 05/11/2013 0341   BUN 12 10/24/2021 1555   BUN 12 05/11/2013 0341   CREATININE 0.78 10/24/2021 1555   CREATININE 0.71 05/15/2013 0629   CALCIUM 9.7 10/24/2021 1555   CALCIUM 8.7 05/11/2013 0341   GFRNONAA >60 05/05/2021 1105   GFRNONAA >60 05/15/2013 0629   GFRAA >60 10/25/2019 0739   GFRAA >60 05/15/2013 0629    BNP No results found for: "BNP"  ProBNP    Component Value Date/Time   PROBNP 106.5 01/28/2011 2052    Imaging: DG Ankle Complete Left  Result Date: 10/24/2021 CLINICAL DATA:  Left ankle pain and swelling EXAM: LEFT ANKLE COMPLETE - 3+ VIEW COMPARISON:  None Available. FINDINGS: Normal alignment without acute osseous finding or fracture. Distal tibia, distal fibula, talus and calcaneus appear intact. Moderate degenerative arthropathy of the  talonavicular joint with joint space loss, sclerosis and dorsal osteophytes. This is best demonstrated on the lateral view. IMPRESSION: No acute abnormality. Talonavicular degenerative arthropathy. Electronically Signed   By: Jerilynn Mages.  Shick [name]   On: 10/24/2021 20:28   DG Chest 2 View  Result Date: 10/24/2021 CLINICAL DATA:  Wheezing, history of COPD EXAM: CHEST - 2 VIEW COMPARISON:  07/27/2021 FINDINGS: Stable heart size and vascularity. Similar chronic peribronchial thickening and basilar interstitial changes. Slight increased right basilar opacity may be related to increased atelectasis but difficult to exclude basilar pneumonia. Negative for edema, effusion or pneumothorax. Trachea midline. No acute osseous finding. IMPRESSION: Chronic central bronchitic changes and basilar interstitial prominence. New right basilar streaky opacity compatible with atelectasis versus early basilar pneumonia. Electronically Signed   By: Jerilynn Mages.  Shick [name]   On: 10/24/2021 20:27     Assessment & Plan:   No problem-specific Assessment & Plan notes found for this encounter.     Martyn Ehrich, NP 10/31/2021

## 2021-11-04 ENCOUNTER — Telehealth (INDEPENDENT_AMBULATORY_CARE_PROVIDER_SITE_OTHER): Payer: BC Managed Care – PPO | Admitting: Family

## 2021-11-04 ENCOUNTER — Encounter: Payer: Self-pay | Admitting: Family

## 2021-11-04 VITALS — Ht 64.25 in | Wt 211.0 lb

## 2021-11-04 DIAGNOSIS — J189 Pneumonia, unspecified organism: Secondary | ICD-10-CM | POA: Diagnosis not present

## 2021-11-04 NOTE — Progress Notes (Signed)
Established Patient Office Visit  Subjective:  Patient ID: [name], female    DOB: Feb 20, 1975  Age: 46 y.o. MRN: [id number]  CC:  Chief Complaint  Patient presents with   Cough    Doing much better after last antibiotic.     HPI [name] is here today with concerns.   Still with some lingering fatigue, no longer coughing. No chest congestion or wheezing, feeling much better since finishing the antbx. She took zpack and tolerated well. She also happened to take prednisone 10 mg form her orthopedist at same time which also helped.   She does have f/u with [facility] for spinal tap injection for herniated disc.   Past Medical History:  Diagnosis Date   Alcohol abuse    Anemia 01/31/2011   Asthma    COPD (chronic obstructive pulmonary disease) (HCC)    Depression    Hepatitis C 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 10/24/2021   RNA negative.  Hep C false positive.     Hypoxia 02/01/2011   Medical history non-contributory    Needs sleep apnea assessment 2013.07.31   Tobacco abuse     Past Surgical History:  Procedure Laterality Date   ANKLE GANGLION CYST EXCISION     BACK SURGERY     CESAREAN SECTION     COLONOSCOPY WITH PROPOFOL N/A 06/27/2016   Procedure: COLONOSCOPY WITH PROPOFOL;  Surgeon: Jonathon Bellows, MD;  Location: [facility] ENDOSCOPY;  Service: Endoscopy;  Laterality: N/A;   DILATION AND CURETTAGE OF UTERUS     ESOPHAGOGASTRODUODENOSCOPY (EGD) WITH PROPOFOL N/A 06/27/2016   Procedure: ESOPHAGOGASTRODUODENOSCOPY (EGD) WITH PROPOFOL;  Surgeon: Jonathon Bellows, MD;  Location: [facility] ENDOSCOPY;  Service: Endoscopy;  Laterality: N/A;    Family History  Problem Relation Age of Onset   Stroke Mother    Hypothyroidism Mother    Coronary artery disease Mother    Throat cancer Mother    Stroke Maternal Grandmother    Diabetes Maternal Grandmother    COPD Paternal Grandmother     Social History   Socioeconomic History   Marital status: Single    Spouse name: Not on  file   Number of children: 1   Years of education: Not on file   Highest education level: Not on file  Occupational History    Employer: [employer]  Tobacco Use   Smoking status: Every Day    Packs/day: 1.00    Years: 25.00    Total pack years: 25.00    Types: Cigarettes   Smokeless tobacco: Never   Tobacco comments:    4-5 cigs daily 02/19/20  Vaping Use   Vaping Use: Former  Substance and Sexual Activity   Alcohol use: Yes   Drug use: Not Currently    Types: Marijuana, Cocaine    Comment: last >2 y/o   Sexual activity: Not Currently    Partners: Female    Comment: Homosexual  Other Topics Concern   Not on file  Social History Narrative   Son, 93 y/o    Social Determinants of Health   Financial Resource Strain: Not on file  Food Insecurity: Not on file  Transportation Needs: Not on file  Physical Activity: Not on file  Stress: Not on file  Social Connections: Not on file  Intimate Partner Violence: Not on file    Outpatient Medications Prior to Visit  Medication Sig Dispense Refill   albuterol (PROVENTIL) (2.5 MG/3ML) 0.083% nebulizer solution Take 3 mLs (2.5 mg total) by nebulization every 6 (six)  hours as needed for wheezing or shortness of breath. For asthma 75 mL 1   albuterol (VENTOLIN HFA) 108 (90 Base) MCG/ACT inhaler Inhale 1-2 puffs into the lungs every 6 (six) hours as needed for wheezing or shortness of breath. 6.7 g 0   ALPRAZolam (XANAX) 0.25 MG tablet Take 0.25 mg by mouth 2 (two) times daily as needed for anxiety or sleep.     budesonide-formoterol (SYMBICORT) 80-4.5 MCG/ACT inhaler Inhale 2 puffs into the lungs 2 (two) times daily.     FLUoxetine (PROZAC) 40 MG capsule Take 1 capsule (40 mg total) by mouth daily. 90 capsule 1   gabapentin (NEURONTIN) 300 MG capsule Take 1 capsule (300 mg total) by mouth 4 (four) times daily. 120 capsule 2   levothyroxine (SYNTHROID) 175 MCG tablet Take 1 tablet (175 mcg total) by mouth daily. 30 tablet 1    meloxicam (MOBIC) 7.5 MG tablet Take 1 tablet (7.5 mg total) by mouth 2 (two) times daily. 180 tablet 0   montelukast (SINGULAIR) 10 MG tablet Take 1 tablet (10 mg total) by mouth at bedtime. 30 tablet 0   pantoprazole (PROTONIX) 40 MG tablet Take 1 tablet (40 mg total) by mouth daily. 90 tablet 3   Vitamin D, Ergocalciferol, (DRISDOL) 1.25 MG (50000 UNIT) CAPS capsule Take 1 capsule (50,000 Units total) by mouth every 7 (seven) days for 8 doses. 8 capsule 0   amoxicillin-clavulanate (AUGMENTIN) 875-125 MG tablet Take 1 tablet by mouth 2 (two) times daily. 20 tablet 0   lidocaine (XYLOCAINE) 5 % ointment Apply 1 application topically 3 (three) times daily as needed. (Patient not taking: Reported on 11/04/2021) 35.44 g 0   No facility-administered medications prior to visit.    No Known Allergies      Objective:    Physical Exam Constitutional:      General: She is not in acute distress.    Appearance: Normal appearance. She is not ill-appearing, toxic-appearing or diaphoretic.  Pulmonary:     Effort: Pulmonary effort is normal.  Neurological:     General: No focal deficit present.     Mental Status: She is alert and oriented to person, place, and time. Mental status is at baseline.  Psychiatric:        Mood and Affect: Mood normal.        Behavior: Behavior normal.        Thought Content: Thought content normal.        Judgment: Judgment normal.     Ht 5' 4.25" (1.632 m)   Wt 211 lb (95.7 kg)   LMP 10/03/2021 (Approximate)   BMI 35.94 kg/m  Wt Readings from Last 3 Encounters:  11/04/21 211 lb (95.7 kg)  10/28/21 211 lb 8 oz (95.9 kg)  10/24/21 211 lb 8 oz (95.9 kg)     Health Maintenance Due  Topic Date Due   TETANUS/TDAP  Never done   PAP SMEAR-Modifier  Never done    There are no preventive care reminders to display for this patient.  Lab Results  Component Value Date   TSH 6.11 (H) 10/24/2021   Lab Results  Component Value Date   WBC 6.6 10/24/2021   HGB  11.1 (L) 10/24/2021   HCT 35.1 (L) 10/24/2021   MCV 75.3 (L) 10/24/2021   PLT 305.0 10/24/2021   Lab Results  Component Value Date   NA 138 10/24/2021   K 4.2 10/24/2021   CO2 27 10/24/2021   GLUCOSE 82 10/24/2021   BUN 12  10/24/2021   CREATININE 0.78 10/24/2021   BILITOT 0.5 10/24/2021   ALKPHOS 67 10/24/2021   AST 13 10/24/2021   ALT 12 10/24/2021   PROT 7.2 10/24/2021   ALBUMIN 4.3 10/24/2021   CALCIUM 9.7 10/24/2021   ANIONGAP 8 05/05/2021   GFR 90.60 10/24/2021   Lab Results  Component Value Date   HGBA1C 5.5 10/24/2021      Assessment & Plan:   Problem List Items Addressed This Visit       Respiratory   Pneumonia due to infectious organism - Primary    Completed antbx feeling much better. Did not take augmentin, only zpack.  Will repeat chest xray , pt to come into office when able, order already placed. Pt advised to let me know if symptoms return       Relevant Orders   DG Chest 2 View    No orders of the defined types were placed in this encounter.   Follow-up: No follow-ups on file.    Eugenia Pancoast, FNP

## 2021-11-04 NOTE — Assessment & Plan Note (Signed)
Completed antbx feeling much better. Did not take augmentin, only zpack.  Will repeat chest xray , pt to come into office when able, order already placed. Pt advised to let me know if symptoms return

## 2021-11-12 ENCOUNTER — Emergency Department (HOSPITAL_COMMUNITY): Payer: BC Managed Care – PPO

## 2021-11-12 ENCOUNTER — Encounter (HOSPITAL_COMMUNITY): Payer: Self-pay | Admitting: *Deleted

## 2021-11-12 ENCOUNTER — Other Ambulatory Visit: Payer: Self-pay

## 2021-11-12 ENCOUNTER — Inpatient Hospital Stay (HOSPITAL_COMMUNITY)
Admission: EM | Admit: 2021-11-12 | Discharge: 2021-11-15 | DRG: 193 | Disposition: A | Discharge status: Home / Self Care | Payer: BC Managed Care – PPO | Attending: Internal Medicine | Admitting: Internal Medicine

## 2021-11-12 DIAGNOSIS — Z79899 Other long term (current) drug therapy: Secondary | ICD-10-CM

## 2021-11-12 DIAGNOSIS — F32A Depression, unspecified: Secondary | ICD-10-CM | POA: Diagnosis present

## 2021-11-12 DIAGNOSIS — T380X5A Adverse effect of glucocorticoids and synthetic analogues, initial encounter: Secondary | ICD-10-CM | POA: Diagnosis not present

## 2021-11-12 DIAGNOSIS — F1020 Alcohol dependence, uncomplicated: Secondary | ICD-10-CM | POA: Diagnosis present

## 2021-11-12 DIAGNOSIS — Z833 Family history of diabetes mellitus: Secondary | ICD-10-CM | POA: Diagnosis not present

## 2021-11-12 DIAGNOSIS — R9431 Abnormal electrocardiogram [ECG] [EKG]: Secondary | ICD-10-CM | POA: Diagnosis not present

## 2021-11-12 DIAGNOSIS — J9811 Atelectasis: Secondary | ICD-10-CM | POA: Diagnosis not present

## 2021-11-12 DIAGNOSIS — Z8349 Family history of other endocrine, nutritional and metabolic diseases: Secondary | ICD-10-CM

## 2021-11-12 DIAGNOSIS — Z20822 Contact with and (suspected) exposure to covid-19: Secondary | ICD-10-CM | POA: Diagnosis not present

## 2021-11-12 DIAGNOSIS — F17210 Nicotine dependence, cigarettes, uncomplicated: Secondary | ICD-10-CM | POA: Diagnosis present

## 2021-11-12 DIAGNOSIS — J440 Chronic obstructive pulmonary disease with acute lower respiratory infection: Secondary | ICD-10-CM | POA: Diagnosis not present

## 2021-11-12 DIAGNOSIS — J9621 Acute and chronic respiratory failure with hypoxia: Secondary | ICD-10-CM | POA: Diagnosis not present

## 2021-11-12 DIAGNOSIS — Z825 Family history of asthma and other chronic lower respiratory diseases: Secondary | ICD-10-CM

## 2021-11-12 DIAGNOSIS — Z7951 Long term (current) use of inhaled steroids: Secondary | ICD-10-CM | POA: Diagnosis not present

## 2021-11-12 DIAGNOSIS — K219 Gastro-esophageal reflux disease without esophagitis: Secondary | ICD-10-CM | POA: Diagnosis not present

## 2021-11-12 DIAGNOSIS — Z791 Long term (current) use of non-steroidal anti-inflammatories (NSAID): Secondary | ICD-10-CM | POA: Diagnosis not present

## 2021-11-12 DIAGNOSIS — D649 Anemia, unspecified: Secondary | ICD-10-CM | POA: Diagnosis present

## 2021-11-12 DIAGNOSIS — Z9981 Dependence on supplemental oxygen: Secondary | ICD-10-CM

## 2021-11-12 DIAGNOSIS — Z720 Tobacco use: Secondary | ICD-10-CM | POA: Diagnosis present

## 2021-11-12 DIAGNOSIS — Z8249 Family history of ischemic heart disease and other diseases of the circulatory system: Secondary | ICD-10-CM

## 2021-11-12 DIAGNOSIS — R0902 Hypoxemia: Secondary | ICD-10-CM | POA: Diagnosis not present

## 2021-11-12 DIAGNOSIS — Z8701 Personal history of pneumonia (recurrent): Secondary | ICD-10-CM

## 2021-11-12 DIAGNOSIS — J441 Chronic obstructive pulmonary disease with (acute) exacerbation: Secondary | ICD-10-CM | POA: Diagnosis not present

## 2021-11-12 DIAGNOSIS — Z808 Family history of malignant neoplasm of other organs or systems: Secondary | ICD-10-CM

## 2021-11-12 DIAGNOSIS — Z823 Family history of stroke: Secondary | ICD-10-CM

## 2021-11-12 DIAGNOSIS — G8929 Other chronic pain: Secondary | ICD-10-CM | POA: Diagnosis present

## 2021-11-12 DIAGNOSIS — F419 Anxiety disorder, unspecified: Secondary | ICD-10-CM | POA: Diagnosis present

## 2021-11-12 DIAGNOSIS — Z98891 History of uterine scar from previous surgery: Secondary | ICD-10-CM

## 2021-11-12 DIAGNOSIS — Z79890 Hormone replacement therapy (postmenopausal): Secondary | ICD-10-CM

## 2021-11-12 DIAGNOSIS — J189 Pneumonia, unspecified organism: Secondary | ICD-10-CM | POA: Diagnosis not present

## 2021-11-12 DIAGNOSIS — E039 Hypothyroidism, unspecified: Secondary | ICD-10-CM | POA: Diagnosis present

## 2021-11-12 DIAGNOSIS — R0602 Shortness of breath: Secondary | ICD-10-CM | POA: Diagnosis not present

## 2021-11-12 DIAGNOSIS — R059 Cough, unspecified: Secondary | ICD-10-CM | POA: Diagnosis not present

## 2021-11-12 LAB — CBC WITH DIFFERENTIAL/PLATELET
Abs Immature Granulocytes: 0.03 10*3/uL (ref 0.00–0.07)
Basophils Absolute: 0 10*3/uL (ref 0.0–0.1)
Basophils Relative: 0 %
Eosinophils Absolute: 0.1 10*3/uL (ref 0.0–0.5)
Eosinophils Relative: 1 %
HCT: 37.7 % (ref 36.0–46.0)
Hemoglobin: 11.3 g/dL — ABNORMAL LOW (ref 12.0–15.0)
Immature Granulocytes: 0 %
Lymphocytes Relative: 6 %
Lymphs Abs: 0.6 10*3/uL — ABNORMAL LOW (ref 0.7–4.0)
MCH: 23.9 pg — ABNORMAL LOW (ref 26.0–34.0)
MCHC: 30 g/dL (ref 30.0–36.0)
MCV: 79.9 fL — ABNORMAL LOW (ref 80.0–100.0)
Monocytes Absolute: 0.3 10*3/uL (ref 0.1–1.0)
Monocytes Relative: 3 %
Neutro Abs: 8.6 10*3/uL — ABNORMAL HIGH (ref 1.7–7.7)
Neutrophils Relative %: 90 %
Platelets: 275 10*3/uL (ref 150–400)
RBC: 4.72 MIL/uL (ref 3.87–5.11)
RDW: 17.4 % — ABNORMAL HIGH (ref 11.5–15.5)
WBC: 9.6 10*3/uL (ref 4.0–10.5)
nRBC: 0 % (ref 0.0–0.2)

## 2021-11-12 LAB — TROPONIN I (HIGH SENSITIVITY)
Troponin I (High Sensitivity): <2 ng/L (ref <18)
Troponin I (High Sensitivity): <2 ng/L (ref <18)

## 2021-11-12 LAB — COMPREHENSIVE METABOLIC PANEL
ALT: 17 U/L (ref 0–44)
AST: 20 U/L (ref 15–41)
Albumin: 4.3 g/dL (ref 3.5–5.0)
Alkaline Phosphatase: 66 U/L (ref 38–126)
Anion gap: 10 (ref 5–15)
BUN: 10 mg/dL (ref 6–20)
CO2: 24 mmol/L (ref 22–32)
Calcium: 9.6 mg/dL (ref 8.9–10.3)
Chloride: 104 mmol/L (ref 98–111)
Creatinine, Ser: 0.71 mg/dL (ref 0.44–1.00)
GFR, Estimated: >60 mL/min (ref >60)
Glucose, Bld: 123 mg/dL — ABNORMAL HIGH (ref 70–99)
Potassium: 4.2 mmol/L (ref 3.5–5.1)
Sodium: 138 mmol/L (ref 135–145)
Total Bilirubin: 0.6 mg/dL (ref 0.3–1.2)
Total Protein: 8.1 g/dL (ref 6.5–8.1)

## 2021-11-12 LAB — RESP PANEL BY RT-PCR (FLU A&B, COVID) ARPGX2
Influenza A by PCR: NEGATIVE
Influenza B by PCR: NEGATIVE
SARS Coronavirus 2 by RT PCR: NEGATIVE

## 2021-11-12 LAB — HCG, QUANTITATIVE, PREGNANCY: hCG, Beta Chain, Quant, S: 1 m[IU]/mL (ref <5)

## 2021-11-12 MED ORDER — ONDANSETRON HCL 4 MG/2ML IJ SOLN
4.0000 mg | Freq: Four times a day (QID) | INTRAMUSCULAR | Status: DC | PRN
  Administered 2021-11-12: 4 mg via INTRAVENOUS
  Filled 2021-11-12: qty 2

## 2021-11-12 MED ORDER — ENOXAPARIN SODIUM 40 MG/0.4ML IJ SOSY
40.0000 mg | PREFILLED_SYRINGE | INTRAMUSCULAR | Status: DC
  Administered 2021-11-12 – 2021-11-14 (×3): 40 mg via SUBCUTANEOUS
  Filled 2021-11-12 (×3): qty 0.4

## 2021-11-12 MED ORDER — METHYLPREDNISOLONE SODIUM SUCC 125 MG IJ SOLR
125.0000 mg | Freq: Once | INTRAMUSCULAR | Status: AC
  Administered 2021-11-12: 125 mg via INTRAVENOUS
  Filled 2021-11-12: qty 2

## 2021-11-12 MED ORDER — IPRATROPIUM-ALBUTEROL 0.5-2.5 (3) MG/3ML IN SOLN
3.0000 mL | Freq: Four times a day (QID) | RESPIRATORY_TRACT | Status: DC
  Administered 2021-11-12 – 2021-11-14 (×8): 3 mL via RESPIRATORY_TRACT
  Filled 2021-11-12 (×10): qty 3

## 2021-11-12 MED ORDER — ALBUTEROL SULFATE (2.5 MG/3ML) 0.083% IN NEBU: 2.5000 mg | INHALATION_SOLUTION | Freq: Once | RESPIRATORY_TRACT | Status: DC

## 2021-11-12 MED ORDER — SODIUM CHLORIDE 0.9 % IV SOLN
1.0000 g | Freq: Once | INTRAVENOUS | Status: AC
  Administered 2021-11-12: 1 g via INTRAVENOUS
  Filled 2021-11-12: qty 10

## 2021-11-12 MED ORDER — VITAMIN D (ERGOCALCIFEROL) 1.25 MG (50000 UNIT) PO CAPS
50000.0000 [IU] | ORAL_CAPSULE | ORAL | Status: DC
  Administered 2021-11-12: 50000 [IU] via ORAL
  Filled 2021-11-12: qty 1

## 2021-11-12 MED ORDER — FLUTICASONE FUROATE-VILANTEROL 100-25 MCG/ACT IN AEPB
1.0000 | INHALATION_SPRAY | Freq: Every day | RESPIRATORY_TRACT | Status: DC
  Administered 2021-11-13 – 2021-11-15 (×4): 1 via RESPIRATORY_TRACT
  Filled 2021-11-12: qty 28

## 2021-11-12 MED ORDER — ACETAMINOPHEN 325 MG PO TABS: 650.0000 mg | ORAL_TABLET | Freq: Four times a day (QID) | ORAL | Status: DC | PRN

## 2021-11-12 MED ORDER — ALBUTEROL SULFATE (2.5 MG/3ML) 0.083% IN NEBU
INHALATION_SOLUTION | RESPIRATORY_TRACT | Status: AC
  Filled 2021-11-12: qty 3

## 2021-11-12 MED ORDER — SODIUM CHLORIDE 0.9 % IV BOLUS
1000.0000 mL | Freq: Once | INTRAVENOUS | Status: AC
  Administered 2021-11-12: 1000 mL via INTRAVENOUS

## 2021-11-12 MED ORDER — PANTOPRAZOLE SODIUM 40 MG PO TBEC
40.0000 mg | DELAYED_RELEASE_TABLET | Freq: Every day | ORAL | Status: DC
  Administered 2021-11-12 – 2021-11-15 (×4): 40 mg via ORAL
  Filled 2021-11-12 (×4): qty 1

## 2021-11-12 MED ORDER — SODIUM CHLORIDE 0.9 % IV SOLN
500.0000 mg | INTRAVENOUS | Status: DC
  Administered 2021-11-13 – 2021-11-14 (×2): 500 mg via INTRAVENOUS
  Filled 2021-11-12 (×2): qty 5

## 2021-11-12 MED ORDER — LEVOTHYROXINE SODIUM 75 MCG PO TABS
175.0000 ug | ORAL_TABLET | Freq: Every day | ORAL | Status: DC
  Administered 2021-11-13 – 2021-11-15 (×3): 175 ug via ORAL
  Filled 2021-11-12 (×3): qty 1

## 2021-11-12 MED ORDER — DOCUSATE SODIUM 100 MG PO CAPS
100.0000 mg | ORAL_CAPSULE | Freq: Two times a day (BID) | ORAL | Status: DC
  Administered 2021-11-13: 100 mg via ORAL
  Filled 2021-11-12: qty 1

## 2021-11-12 MED ORDER — KETOROLAC TROMETHAMINE 15 MG/ML IJ SOLN
15.0000 mg | Freq: Once | INTRAMUSCULAR | Status: AC
  Administered 2021-11-12: 15 mg via INTRAVENOUS
  Filled 2021-11-12: qty 1

## 2021-11-12 MED ORDER — FLUOXETINE HCL 20 MG PO CAPS
40.0000 mg | ORAL_CAPSULE | Freq: Every day | ORAL | Status: DC
  Administered 2021-11-13 – 2021-11-15 (×3): 40 mg via ORAL
  Filled 2021-11-12 (×3): qty 2

## 2021-11-12 MED ORDER — GABAPENTIN 300 MG PO CAPS
300.0000 mg | ORAL_CAPSULE | Freq: Four times a day (QID) | ORAL | Status: DC
  Administered 2021-11-12 – 2021-11-15 (×11): 300 mg via ORAL
  Filled 2021-11-12 (×12): qty 1

## 2021-11-12 MED ORDER — SENNOSIDES-DOCUSATE SODIUM 8.6-50 MG PO TABS: 1.0000 | ORAL_TABLET | Freq: Every evening | ORAL | Status: DC | PRN

## 2021-11-12 MED ORDER — MONTELUKAST SODIUM 10 MG PO TABS
10.0000 mg | ORAL_TABLET | Freq: Every day | ORAL | Status: DC
  Administered 2021-11-12 – 2021-11-14 (×3): 10 mg via ORAL
  Filled 2021-11-12 (×3): qty 1

## 2021-11-12 MED ORDER — IPRATROPIUM-ALBUTEROL 0.5-2.5 (3) MG/3ML IN SOLN
3.0000 mL | Freq: Once | RESPIRATORY_TRACT | Status: AC
  Administered 2021-11-12: 3 mL via RESPIRATORY_TRACT
  Filled 2021-11-12: qty 3

## 2021-11-12 MED ORDER — OXYCODONE HCL 5 MG PO TABS
5.0000 mg | ORAL_TABLET | ORAL | Status: DC | PRN
  Administered 2021-11-12 – 2021-11-14 (×5): 5 mg via ORAL
  Filled 2021-11-12 (×5): qty 1

## 2021-11-12 MED ORDER — IOHEXOL 350 MG/ML SOLN
100.0000 mL | Freq: Once | INTRAVENOUS | Status: AC | PRN
  Administered 2021-11-12: 150 mL via INTRAVENOUS

## 2021-11-12 MED ORDER — ALBUTEROL SULFATE (2.5 MG/3ML) 0.083% IN NEBU
2.5000 mg | INHALATION_SOLUTION | RESPIRATORY_TRACT | Status: DC | PRN
  Administered 2021-11-12 – 2021-11-14 (×2): 2.5 mg via RESPIRATORY_TRACT
  Filled 2021-11-12 (×3): qty 3

## 2021-11-12 MED ORDER — DOXYCYCLINE HYCLATE 100 MG PO TABS
100.0000 mg | ORAL_TABLET | Freq: Once | ORAL | Status: AC
  Administered 2021-11-12: 100 mg via ORAL
  Filled 2021-11-12: qty 1

## 2021-11-12 MED ORDER — ONDANSETRON HCL 4 MG PO TABS: 4.0000 mg | ORAL_TABLET | Freq: Four times a day (QID) | ORAL | Status: DC | PRN

## 2021-11-12 MED ORDER — ALBUTEROL SULFATE (2.5 MG/3ML) 0.083% IN NEBU
10.0000 mg | INHALATION_SOLUTION | Freq: Once | RESPIRATORY_TRACT | Status: AC
  Administered 2021-11-13: 10 mg via RESPIRATORY_TRACT
  Filled 2021-11-12: qty 12

## 2021-11-12 MED ORDER — MAGNESIUM SULFATE 2 GM/50ML IV SOLN
2.0000 g | Freq: Once | INTRAVENOUS | Status: AC
  Administered 2021-11-12: 2 g via INTRAVENOUS
  Filled 2021-11-12: qty 50

## 2021-11-12 MED ORDER — SODIUM CHLORIDE 0.9 % IV SOLN: INTRAVENOUS | Status: AC

## 2021-11-12 MED ORDER — METHYLPREDNISOLONE SODIUM SUCC 40 MG IJ SOLR
40.0000 mg | Freq: Two times a day (BID) | INTRAMUSCULAR | Status: AC
  Administered 2021-11-12 – 2021-11-13 (×2): 40 mg via INTRAVENOUS
  Filled 2021-11-12 (×2): qty 1

## 2021-11-12 MED ORDER — ONDANSETRON HCL 4 MG/2ML IJ SOLN
4.0000 mg | Freq: Once | INTRAMUSCULAR | Status: AC
  Administered 2021-11-12: 4 mg via INTRAVENOUS
  Filled 2021-11-12: qty 2

## 2021-11-12 MED ORDER — ALPRAZOLAM 0.25 MG PO TABS
0.2500 mg | ORAL_TABLET | Freq: Two times a day (BID) | ORAL | Status: DC | PRN
  Administered 2021-11-12 – 2021-11-15 (×5): 0.25 mg via ORAL
  Filled 2021-11-12 (×5): qty 1

## 2021-11-12 MED ORDER — ACETAMINOPHEN 650 MG RE SUPP: 650.0000 mg | Freq: Four times a day (QID) | RECTAL | Status: DC | PRN

## 2021-11-12 MED ORDER — SODIUM CHLORIDE 0.9 % IV SOLN
2.0000 g | INTRAVENOUS | Status: DC
  Administered 2021-11-13 – 2021-11-14 (×2): 2 g via INTRAVENOUS
  Filled 2021-11-12 (×2): qty 20

## 2021-11-12 MED ORDER — MORPHINE SULFATE (PF) 4 MG/ML IV SOLN
4.0000 mg | Freq: Once | INTRAVENOUS | Status: AC
  Administered 2021-11-12: 4 mg via INTRAVENOUS
  Filled 2021-11-12: qty 1

## 2021-11-12 MED ORDER — PREDNISONE 20 MG PO TABS: 40.0000 mg | ORAL_TABLET | Freq: Every day | ORAL | Status: DC

## 2021-11-12 NOTE — H&P (Signed)
TRH H&P   Patient Demographics:    [name], is a 46 y.o. female  MRN: [id number]   DOB - 1975/10/30  Admit Date - 11/12/2021  Outpatient Primary MD for the patient is Eugenia Pancoast, [place]  Referring MD/NP/PA: PA Robbins  Patient coming from: Home  Chief Complaint  Patient presents with   Shortness of Breath      HPI:    [name]  is a 46 y.o. female, with past medical history of COPD, still smoking, on 3 L nasal cannula as needed at home, patient presents to ED with complaints of shortness of breath, patient on 3 L nasal cannula at baseline, she uses oxygen intermittently, she still smoking, patient report wheezing, dyspnea, cough, productive, feels like her previous COPD exacerbation, no improvement with increased nebulizer use, she reports cough is productive with yellow phlegm, recently diagnosed with pneumonia, treated with azithromycin, she reports initial mild improvement but currently symptoms and cough and phlegm back again, she denies fever, chills, night sweats, abdominal pain, nausea or vomiting. - in ED CTA with no evidence of PE, but significant for multifocal opacity, he is with significant wheezing, hypoxic 87% on 3 L oxygen currently requiring 5 L, she received IV Solu-Medrol and [facility] hospitalist consulted to admit.   Review of systems:    A full 10 point Review of Systems was done, except as stated above, all other Review of Systems were negative.   With Past History of the following :    Past Medical History:  Diagnosis Date   Alcohol abuse    Anemia 01/31/2011   Asthma    COPD (chronic obstructive pulmonary disease) (HCC)    Depression    Hepatitis C 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 10/24/2021   RNA negative.  Hep C false positive.     Hypoxia 02/01/2011   Medical history non-contributory    Needs sleep apnea assessment 2013.07.31   Tobacco  abuse       Past Surgical History:  Procedure Laterality Date   ANKLE GANGLION CYST EXCISION     BACK SURGERY     CESAREAN SECTION     COLONOSCOPY WITH PROPOFOL N/A 06/27/2016   Procedure: COLONOSCOPY WITH PROPOFOL;  Surgeon: Jonathon Bellows, MD;  Location: [facility] ENDOSCOPY;  Service: Endoscopy;  Laterality: N/A;   DILATION AND CURETTAGE OF UTERUS     ESOPHAGOGASTRODUODENOSCOPY (EGD) WITH PROPOFOL N/A 06/27/2016   Procedure: ESOPHAGOGASTRODUODENOSCOPY (EGD) WITH PROPOFOL;  Surgeon: Jonathon Bellows, MD;  Location: [facility] ENDOSCOPY;  Service: Endoscopy;  Laterality: N/A;      Social History:     Social History   Tobacco Use   Smoking status: Every Day    Packs/day: 1.00    Years: 25.00    Total pack years: 25.00    Types: Cigarettes   Smokeless tobacco: Never   Tobacco comments:    4-5 cigs daily 02/19/20  Substance Use Topics  Alcohol use: Yes       Family History :     Family History  Problem Relation Age of Onset   Stroke Mother    Hypothyroidism Mother    Coronary artery disease Mother    Throat cancer Mother    Stroke Maternal Grandmother    Diabetes Maternal Grandmother    COPD Paternal Grandmother       Home Medications:   Prior to Admission medications   Medication Sig Start Date End Date Taking? Authorizing Provider  albuterol (PROVENTIL) (2.5 MG/3ML) 0.083% nebulizer solution Take 3 mLs (2.5 mg total) by nebulization every 6 (six) hours as needed for wheezing or shortness of breath. For asthma 12/11/20  Yes Kathie Dike, MD  albuterol (VENTOLIN HFA) 108 (90 Base) MCG/ACT inhaler Inhale 1-2 puffs into the lungs every 6 (six) hours as needed for wheezing or shortness of breath. 02/11/19  Yes Noemi Chapel, MD  ALPRAZolam Duanne Moron) 0.25 MG tablet Take 0.25 mg by mouth 2 (two) times daily as needed for anxiety or sleep. 10/09/19  Yes [provider]  budesonide-formoterol (SYMBICORT) 80-4.5 MCG/ACT inhaler Inhale 2 puffs into the lungs 2 (two) times daily.  04/27/21  Yes [provider]  FLUoxetine (PROZAC) 40 MG capsule Take 1 capsule (40 mg total) by mouth daily. 10/24/21 04/22/22 Yes Dugal, Lawerance Bach, FNP  gabapentin (NEURONTIN) 300 MG capsule Take 1 capsule (300 mg total) by mouth 4 (four) times daily. 10/24/21 01/22/22 Yes Dugal, Lawerance Bach, FNP  levothyroxine (SYNTHROID) 175 MCG tablet Take 1 tablet (175 mcg total) by mouth daily. 10/27/21  Yes Dugal, Lawerance Bach, FNP  meloxicam (MOBIC) 7.5 MG tablet Take 1 tablet (7.5 mg total) by mouth 2 (two) times daily. 10/24/21 01/22/22 Yes Dugal, [name], FNP  montelukast (SINGULAIR) 10 MG tablet Take 1 tablet (10 mg total) by mouth at bedtime. 10/24/21  Yes Dugal, Lawerance Bach, FNP  pantoprazole (PROTONIX) 40 MG tablet Take 1 tablet (40 mg total) by mouth daily. 10/24/21  Yes Dugal, Lawerance Bach, FNP  Vitamin D, Ergocalciferol, (DRISDOL) 1.25 MG (50000 UNIT) CAPS capsule Take 1 capsule (50,000 Units total) by mouth every 7 (seven) days for 8 doses. 10/28/21 12/17/21 Yes Dugal, Lawerance Bach, FNP     Allergies:    No Known Allergies   Physical Exam:   Vitals  Blood pressure 103/67, pulse (!) 110, temperature 98.1 F (36.7 C), temperature source Oral, resp. rate (!) 23, height 5' 4.25" (1.632 m), weight 95.7 kg, last menstrual period 10/03/2021, SpO2 91 %.   1. General frail female, years older than stated age, in some distress due to dyspnea  2. Normal affect and insight, Not Suicidal or Homicidal, Awake Alert, Oriented X 3.  3. No F.N deficits, ALL C.Nerves Intact, Strength 5/5 all 4 extremities, Sensation intact all 4 extremities, Plantars down going.  4. Ears and Eyes appear Normal, Conjunctivae clear, PERRLA. Moist Oral Mucosa.  5. Supple Neck, No JVD, No cervical lymphadenopathy appriciated, No Carotid Bruits.  6. Symmetrical Chest wall movement, diminished air entry bilaterally, diffuse wheezing  7. RRR, No Gallops, Rubs or Murmurs, No Parasternal Heave.  8. Positive Bowel Sounds, Abdomen Soft, No  tenderness, No organomegaly appriciated,No rebound -guarding or rigidity.  9.  No Cyanosis, Normal Skin Turgor, No Skin Rash or Bruise.  10. Good muscle tone,  joints appear normal , no effusions, Normal ROM.  11. No Palpable Lymph Nodes in Neck or Axillae    Data Review:    CBC Recent Labs  Lab 11/12/21 1408  WBC 9.6  HGB 11.3*  HCT 37.7  PLT 275  MCV 79.9*  MCH 23.9*  MCHC 30.0  RDW 17.4*  LYMPHSABS 0.6*  MONOABS 0.3  EOSABS 0.1  BASOSABS 0.0   ------------------------------------------------------------------------------------------------------------------  Chemistries  Recent Labs  Lab 11/12/21 1408  NA 138  K 4.2  CL 104  CO2 24  GLUCOSE 123*  BUN 10  CREATININE 0.71  CALCIUM 9.6  AST 20  ALT 17  ALKPHOS 66  BILITOT 0.6   ------------------------------------------------------------------------------------------------------------------ estimated creatinine clearance is 98.1 mL/min (by C-G formula based on SCr of 0.71 mg/dL). ------------------------------------------------------------------------------------------------------------------ No results for input(s): "TSH", "T4TOTAL", "T3FREE", "THYROIDAB" in the last 72 hours.  Invalid input(s): "FREET3"  Coagulation profile No results for input(s): "INR", "PROTIME" in the last 168 hours. ------------------------------------------------------------------------------------------------------------------- No results for input(s): "DDIMER" in the last 72 hours. -------------------------------------------------------------------------------------------------------------------  Cardiac Enzymes No results for input(s): "CKMB", "TROPONINI", "MYOGLOBIN" in the last 168 hours.  Invalid input(s): "CK" ------------------------------------------------------------------------------------------------------------------ No results found for:  "BNP"   ---------------------------------------------------------------------------------------------------------------  Urinalysis    Component Value Date/Time   COLORURINE YELLOW 09/19/2019 1014   APPEARANCEUR HAZY (A) 09/19/2019 1014   APPEARANCEUR Hazy 05/10/2013 1216   LABSPEC 1.010 09/19/2019 1014   LABSPEC 1.014 05/10/2013 1216   PHURINE 7.0 09/19/2019 1014   GLUCOSEU NEGATIVE 09/19/2019 1014   GLUCOSEU Negative 05/10/2013 1216   HGBUR NEGATIVE 09/19/2019 1014   BILIRUBINUR NEGATIVE 09/19/2019 1014   BILIRUBINUR Negative 05/10/2013 1216   KETONESUR NEGATIVE 09/19/2019 1014   PROTEINUR NEGATIVE 09/19/2019 1014   UROBILINOGEN 0.2 09/10/2011 0250   NITRITE NEGATIVE 09/19/2019 1014   LEUKOCYTESUR TRACE (A) 09/19/2019 1014   LEUKOCYTESUR Negative 05/10/2013 1216    ----------------------------------------------------------------------------------------------------------------   Imaging Results:    CT Angio Chest PE W/Cm &/Or Wo Cm  Result Date: 11/12/2021 CLINICAL DATA:  PE suspected. Repeat scan due to lack of contrast in the pulmonary veins. Shortness of breath since last night. Patient reports recent pneumonia. EXAM: CT ANGIOGRAPHY CHEST WITH CONTRAST TECHNIQUE: Multidetector CT imaging of the chest was performed using the standard protocol during bolus administration of intravenous contrast. Multiplanar CT image reconstructions and MIPs were obtained to evaluate the vascular anatomy. RADIATION DOSE REDUCTION: This exam was performed according to the departmental dose-optimization program which includes automated exposure control, adjustment of the mA and/or kV according to patient size and/or use of iterative reconstruction technique. CONTRAST:  185m OMNIPAQUE IOHEXOL 350 MG/ML SOLN COMPARISON:  CT chest 05/27/2007 FINDINGS: Cardiovascular: Satisfactory opacification of the pulmonary arteries to the segmental level. No evidence of pulmonary embolism. Normal heart size. No  pericardial effusion. The main pulmonary artery is dilated measuring up to 4 cm in diameter. Mediastinum/Nodes: No enlarged mediastinal, hilar, or axillary lymph nodes. Thyroid gland, trachea, and esophagus demonstrate no significant findings. Lungs/Pleura: Patchy airspace opacity is noted in the superior segment of the right lower lobe (series 9, image 72). Additional smaller patchy airspace opacities noted in the medial right lower lobe as well as the medial left lower lobe (series 9, image 74, 76). Linear subsegmental atelectasis noted in the medial left upper lobe, lower right upper lobe anteriorly, lateral aspect of the right middle lobe, as well as the medial left lung base, similar to 05/27/2007. Two 0.5 cm ground-glass opacities are noted in the medial right upper lobe (series 9, images 28, 38). No pleural effusion or pneumothorax. Upper Abdomen: No acute abnormality. Musculoskeletal: No chest wall abnormality. No acute or significant osseous findings. Review of the MIP images confirms the above findings. IMPRESSION: 1. No  evidence of pulmonary embolism. 2. Multifocal patchy airspace opacities, most prominent in the superior segment of the right lower lobe, consistent with multifocal pneumonia. 3. Two 0.5 cm ground-glass opacities in the medial right upper lobe, likely infectious/inflammatory given the multifocal opacities described above. 4. Dilated main pulmonary artery measuring up to 4 cm in diameter, suggestive of underlying pulmonary arterial hypertension. 5. Scattered areas of linear subsegmental atelectasis, similar to 05/27/2007. Electronically Signed   By: Ileana Roup [name]   On: 11/12/2021 16:18   DG Chest Port 1 View  Result Date: 11/12/2021 CLINICAL DATA:  Shortness of breath, productive cough EXAM: PORTABLE CHEST 1 VIEW COMPARISON:  10/24/2021 FINDINGS: The heart size and mediastinal contours are within normal limits. Improved, although persistent scattered heterogeneous opacity of the lung  bases. The visualized skeletal structures are unremarkable. IMPRESSION: Improved, although persistent scattered heterogeneous opacity of the lung bases, suggesting improved infection or aspiration. No new airspace opacity. Electronically Signed   By: Delanna Ahmadi [name]   On: 11/12/2021 15:16    My personal review of EKG: Rhythm sinus tachycardia, Rate  116/min, QTc 481 , no Acute ST changes   Assessment & Plan:    Active Problems:   Hypothyroidism   Tobacco abuse   Alcohol dependence ([place])   Acute on chronic respiratory failure with hypoxia (HCC)   Pneumonia due to infectious organism   COPD exacerbation (HCC)      Acute on chronic respiratory failure with hypoxia COPD exacerbation Multifocal pneumonia -Patient with significant wheezing, dyspnea, increased work of breathing, this is related to COPD exacerbation and multifocal pneumonia. -No evidence of PE on CTA -Courage to use incentive spirometry and flutter valve. -Continue with IV Solu-Medrol, incentive spirometry, flutter valve, scheduled DuoNebs, as needed albuterol, Singulair and Symbicort -We will start him again on IV azithromycin and Rocephin, follow on sputum cultures  Hypothyroidism -continue with Synthroid  Anxiety/depression -Continue with home medications  Tobacco abuse -She was counseled  GERD -Continue with PPI  DVT Prophylaxis Lovenox  AM Labs Ordered, also please review Full Orders  Family Communication: Admission, patients condition and plan of care including tests being ordered have been discussed with the patient who indicate understanding and agree with the plan and Code Status.  Code Status Full  Likely DC to  home  Condition GUARDED    Consults called: none    Admission status: inpatient    Time spent in minutes : 70 minutes   Phillips Climes M.D on 11/12/2021 at [place] PM   [facility] - Office  [phone]

## 2021-11-12 NOTE — ED Provider Notes (Signed)
[facility] EMERGENCY DEPARTMENT Provider Note   CSN: [id number] Arrival date & time: 11/12/21  1331     History  Chief Complaint  Patient presents with   Shortness of Breath    [name] is a 46 y.o. female.   Shortness of Breath   46 year old female presents emergency department with complaints of shortness of breath.  Patient states she has a history of COPD and this feels similar to prior COPD exacerbations.  She states that she felt short of breath upon awakening this morning.  She has been using her inhalers as prescribed but is continued smoking.  She states she was recently diagnosed with pneumonia and given azithromycin on 10/25/2021.  She notes that the past couple days she has had sore throat as well.  She also reports some right-sided chest tightness that occurs with coughing as well as taking a deep breath.  Denies fever, chills, night sweats, abdominal pain, nausea, vomiting, urinary symptoms, vaginal symptoms, change in bowel habits.  Denies history of DVT/PE, current hormonal therapy, recent surgery, immobilization, known cancer diagnosis.  Patient states she is on at home 3 L of oxygen as needed.  Past medical history significant for COPD, hepatitis C, alcohol abuse, tobacco abuse, hypothyroidism  Home Medications Prior to Admission medications   Medication Sig Start Date End Date Taking? Authorizing Provider  albuterol (PROVENTIL) (2.5 MG/3ML) 0.083% nebulizer solution Take 3 mLs (2.5 mg total) by nebulization every 6 (six) hours as needed for wheezing or shortness of breath. For asthma 12/11/20  Yes Kathie Dike, MD  albuterol (VENTOLIN HFA) 108 (90 Base) MCG/ACT inhaler Inhale 1-2 puffs into the lungs every 6 (six) hours as needed for wheezing or shortness of breath. 02/11/19  Yes Noemi Chapel, MD  ALPRAZolam Duanne Moron) 0.25 MG tablet Take 0.25 mg by mouth 2 (two) times daily as needed for anxiety or sleep. 10/09/19  Yes [provider]   budesonide-formoterol (SYMBICORT) 80-4.5 MCG/ACT inhaler Inhale 2 puffs into the lungs 2 (two) times daily. 04/27/21  Yes [provider]  FLUoxetine (PROZAC) 40 MG capsule Take 1 capsule (40 mg total) by mouth daily. 10/24/21 04/22/22 Yes Dugal, Lawerance Bach, FNP  gabapentin (NEURONTIN) 300 MG capsule Take 1 capsule (300 mg total) by mouth 4 (four) times daily. 10/24/21 01/22/22 Yes Dugal, Lawerance Bach, FNP  levothyroxine (SYNTHROID) 175 MCG tablet Take 1 tablet (175 mcg total) by mouth daily. 10/27/21  Yes Dugal, Lawerance Bach, FNP  meloxicam (MOBIC) 7.5 MG tablet Take 1 tablet (7.5 mg total) by mouth 2 (two) times daily. 10/24/21 01/22/22 Yes Dugal, [name], FNP  montelukast (SINGULAIR) 10 MG tablet Take 1 tablet (10 mg total) by mouth at bedtime. 10/24/21  Yes Dugal, Lawerance Bach, FNP  pantoprazole (PROTONIX) 40 MG tablet Take 1 tablet (40 mg total) by mouth daily. 10/24/21  Yes Dugal, Lawerance Bach, FNP  Vitamin D, Ergocalciferol, (DRISDOL) 1.25 MG (50000 UNIT) CAPS capsule Take 1 capsule (50,000 Units total) by mouth every 7 (seven) days for 8 doses. 10/28/21 12/17/21 Yes Eugenia Pancoast, FNP      Allergies    Patient has no known allergies.    Review of Systems   Review of Systems  Respiratory:  Positive for shortness of breath.   All other systems reviewed and are negative.   Physical Exam Updated Vital Signs BP 103/67   Pulse (!) 110   Temp 98.1 F (36.7 C) (Oral)   Resp (!) 23   Ht 5' 4.25" (1.632 m)   Wt 95.7 kg   LMP  10/03/2021 (Approximate)   SpO2 91%   BMI 35.94 kg/m  Physical Exam Vitals and nursing note reviewed.  Constitutional:      General: She is not in acute distress.    Appearance: She is well-developed.  HENT:     Head: Normocephalic and atraumatic.     Nose: Nose normal.     Mouth/Throat:     Comments: Mild posterior pharyngeal erythema noted.  No obvious exudate.  Uvula midline rises symmetrically with phonation.  No swelling noted in the submandibular region.  No edema noted  but beneath the tongue. Eyes:     Extraocular Movements: Extraocular movements intact.     Conjunctiva/sclera: Conjunctivae normal.     Pupils: Pupils are equal, round, and reactive to light.  Neck:     Vascular: No hepatojugular reflux or JVD.  Cardiovascular:     Rate and Rhythm: Normal rate and regular rhythm.     Heart sounds: No murmur heard. Pulmonary:     Effort: Pulmonary effort is normal. No respiratory distress.     Breath sounds: Wheezing present.     Comments: Diffuse waves noted in all lung fields. Abdominal:     Palpations: Abdomen is soft.     Tenderness: There is no abdominal tenderness.  Musculoskeletal:        General: No swelling.     Cervical back: Neck supple. No rigidity or tenderness.     Right lower leg: No edema.  Skin:    General: Skin is warm and dry.     Capillary Refill: Capillary refill takes less than 2 seconds.  Neurological:     Mental Status: She is alert.  Psychiatric:        Mood and Affect: Mood normal.     ED Results / Procedures / Treatments   Labs (all labs ordered are listed, but only abnormal results are displayed) Labs Reviewed  COMPREHENSIVE METABOLIC PANEL - Abnormal; Notable for the following components:      Result Value   Glucose, Bld 123 (*)    All other components within normal limits  CBC WITH DIFFERENTIAL/PLATELET - Abnormal; Notable for the following components:   Hemoglobin 11.3 (*)    MCV 79.9 (*)    MCH 23.9 (*)    RDW 17.4 (*)    Neutro Abs 8.6 (*)    Lymphs Abs 0.6 (*)    All other components within normal limits  RESP PANEL BY RT-PCR (FLU A&B, COVID) ARPGX2  EXPECTORATED SPUTUM ASSESSMENT W GRAM STAIN, RFLX TO RESP C  HCG, QUANTITATIVE, PREGNANCY  TROPONIN I (HIGH SENSITIVITY)  TROPONIN I (HIGH SENSITIVITY)    EKG None  Radiology CT Angio Chest PE W/Cm &/Or Wo Cm  Result Date: 11/12/2021 CLINICAL DATA:  PE suspected. Repeat scan due to lack of contrast in the pulmonary veins. Shortness of breath  since last night. Patient reports recent pneumonia. EXAM: CT ANGIOGRAPHY CHEST WITH CONTRAST TECHNIQUE: Multidetector CT imaging of the chest was performed using the standard protocol during bolus administration of intravenous contrast. Multiplanar CT image reconstructions and MIPs were obtained to evaluate the vascular anatomy. RADIATION DOSE REDUCTION: This exam was performed according to the departmental dose-optimization program which includes automated exposure control, adjustment of the mA and/or kV according to patient size and/or use of iterative reconstruction technique. CONTRAST:  130m OMNIPAQUE IOHEXOL 350 MG/ML SOLN COMPARISON:  CT chest 05/27/2007 FINDINGS: Cardiovascular: Satisfactory opacification of the pulmonary arteries to the segmental level. No evidence of pulmonary embolism. Normal heart size.  No pericardial effusion. The main pulmonary artery is dilated measuring up to 4 cm in diameter. Mediastinum/Nodes: No enlarged mediastinal, hilar, or axillary lymph nodes. Thyroid gland, trachea, and esophagus demonstrate no significant findings. Lungs/Pleura: Patchy airspace opacity is noted in the superior segment of the right lower lobe (series 9, image 72). Additional smaller patchy airspace opacities noted in the medial right lower lobe as well as the medial left lower lobe (series 9, image 74, 76). Linear subsegmental atelectasis noted in the medial left upper lobe, lower right upper lobe anteriorly, lateral aspect of the right middle lobe, as well as the medial left lung base, similar to 05/27/2007. Two 0.5 cm ground-glass opacities are noted in the medial right upper lobe (series 9, images 28, 38). No pleural effusion or pneumothorax. Upper Abdomen: No acute abnormality. Musculoskeletal: No chest wall abnormality. No acute or significant osseous findings. Review of the MIP images confirms the above findings. IMPRESSION: 1. No evidence of pulmonary embolism. 2. Multifocal patchy airspace opacities,  most prominent in the superior segment of the right lower lobe, consistent with multifocal pneumonia. 3. Two 0.5 cm ground-glass opacities in the medial right upper lobe, likely infectious/inflammatory given the multifocal opacities described above. 4. Dilated main pulmonary artery measuring up to 4 cm in diameter, suggestive of underlying pulmonary arterial hypertension. 5. Scattered areas of linear subsegmental atelectasis, similar to 05/27/2007. Electronically Signed   By: Ileana Roup [name]   On: 11/12/2021 16:18   DG Chest Port 1 View  Result Date: 11/12/2021 CLINICAL DATA:  Shortness of breath, productive cough EXAM: PORTABLE CHEST 1 VIEW COMPARISON:  10/24/2021 FINDINGS: The heart size and mediastinal contours are within normal limits. Improved, although persistent scattered heterogeneous opacity of the lung bases. The visualized skeletal structures are unremarkable. IMPRESSION: Improved, although persistent scattered heterogeneous opacity of the lung bases, suggesting improved infection or aspiration. No new airspace opacity. Electronically Signed   By: Delanna Ahmadi [name]   On: 11/12/2021 15:16    Procedures .Critical Care  Performed by: Wilnette Kales, PA Authorized by: Wilnette Kales, PA   Critical care provider statement:    Critical care time (minutes):  45   Critical care was necessary to treat or prevent imminent or life-threatening deterioration of the following conditions:  Respiratory failure   Critical care was time spent personally by me on the following activities:  Development of treatment plan with patient or surrogate, discussions with consultants, evaluation of patient's response to treatment, examination of patient, ordering and review of laboratory studies, ordering and review of radiographic studies, ordering and performing treatments and interventions, pulse oximetry, re-evaluation of patient's condition and review of old charts     Medications Ordered in  ED Medications  albuterol (PROVENTIL) (2.5 MG/3ML) 0.083% nebulizer solution 2.5 mg (has no administration in time range)  cefTRIAXone (ROCEPHIN) 1 g in sodium chloride 0.9 % 100 mL IVPB (1 g Intravenous New Bag/Given 11/12/21 1647)  methylPREDNISolone sodium succinate (SOLU-MEDROL) 125 mg/2 mL injection 125 mg (125 mg Intravenous Given 11/12/21 1422)  ipratropium-albuterol (DUONEB) 0.5-2.5 (3) MG/3ML nebulizer solution 3 mL (3 mLs Nebulization Given 11/12/21 1446)  sodium chloride 0.9 % bolus 1,000 mL (1,000 mLs Intravenous New Bag/Given 11/12/21 1523)  albuterol (PROVENTIL) (2.5 MG/3ML) 0.083% nebulizer solution (  Given 11/12/21 1445)  ondansetron (ZOFRAN) injection 4 mg (4 mg Intravenous Given 11/12/21 1523)  ketorolac (TORADOL) 15 MG/ML injection 15 mg (15 mg Intravenous Given 11/12/21 1524)  iohexol (OMNIPAQUE) 350 MG/ML injection 100 mL (150 mLs Intravenous  Contrast Given 11/12/21 1542)  doxycycline (VIBRA-TABS) tablet 100 mg (100 mg Oral Given 11/12/21 1644)    ED Course/ Medical Decision Making/ A&P Clinical Course as of 11/12/21 1705  Sat Nov 12, 2021  [facility] medicine Dr. Waldron Labs consulted regarding the patient.  He agreed with admission and assume further treatment/care. [CR]    Clinical Course User Index [CR] Wilnette Kales, PA                           Medical Decision Making Amount and/or Complexity of Data Reviewed Labs: ordered. Radiology: ordered.  Risk Prescription drug management. Decision regarding hospitalization.   This patient presents to the ED for concern of shortness of breath, this involves an extensive number of treatment options, and is a complaint that carries with it a high risk of complications and morbidity.  The differential diagnosis includes The causes for shortness of breath include but are not limited to Cardiac (AHF, pericardial effusion and tamponade, arrhythmias, ischemia, etc) Respiratory (COPD, asthma, pneumonia, pneumothorax, primary  pulmonary hypertension, PE/VQ mismatch) Hematological (anemia)  Co morbidities that complicate the patient evaluation  See HPI   Additional history obtained:  Additional history obtained from EMR External records from outside source obtained and reviewed including prior chest x-ray from 10/25/2021: Patient was treated for pneumonia outpatient.   Lab Tests:  I Ordered, and personally interpreted labs.  The pertinent results include: No leukocytosis.  Mild anemia with a hemoglobin 11.3 which is near patient's baseline.  Platelets within normal range.  Respiratory viral panel negative.  Initial troponin of less than 2 with EKG consistent for nonspecific ST changes; story not concerning for ACS with findings.  No electrolyte abnormalities noted.  No transaminitis.  Renal function within normal limits.   Imaging Studies ordered:  I ordered imaging studies including chest x-ray, CT angio chest I independently visualized and interpreted imaging which showed  Chest x-ray: Improved but persistent scattered heterogeneous opacities in the lung base. CT angio chest: No evidence of PE.  Multifocal patchy airspace opacities most prominent superior segment of the right upper lobe.  2.5 cm groundglass opacities in the medial right upper lobe.  Dilated main pulmonary artery measuring up to 4 cm.  Scattered areas of linear subsegmental atelectasis. I agree with the radiologist interpretation  Cardiac Monitoring: / EKG:  The patient was maintained on a cardiac monitor.  I personally viewed and interpreted the cardiac monitored which showed an underlying rhythm of: Sinus tachycardia with nonspecific ST changes.   Consultations Obtained:  See ED course  Problem List / ED Course / Critical interventions / Medication management  COPD exacerbation/pneumonia I ordered medication including Rocephin and doxycycline for commune acquired pneumonia coverage, DuoNeb, albuterol and Solu-Medrol for COPD  exacerbation.  1 L normal saline for patient's hypotension.    Reevaluation of the patient after these medicines showed that the patient improved I have reviewed the patients home medicines and have made adjustments as needed   Social Determinants of Health:  Chronic cigarette use.  Denies illicit drug use   Test / Admission - Considered:  COPD exacerbation/multifocal pneumonia/hypoxia Vitals signs significant for persistent tachycardia although decreased from initial presentation of 122.  Patient intermittently tachypneic with respiratory rate greater than 20 despite multiple breathing treatments and administration of IV steroids.  Patient's blood pressure responded to administration of IV fluids.. Otherwise within normal range and stable throughout visit. Laboratory/imaging studies significant for: See above Patient deemed to meet  admission criteria given continuation of symptoms despite treatment of COPD exacerbation as well as failed outpatient treatment of community-acquired pneumonia.  Hospital medicine was consulted regarding the patient agreed with admission.  Treatment plan discussed at length with patient and she knowledge understanding was agreeable to said plan. Patient was stable upon admission to the hospital.         Final Clinical Impression(s) / ED Diagnoses Final diagnoses:  COPD exacerbation ([place])  Hypoxia  Multifocal pneumonia    Rx / DC Orders ED Discharge Orders     None         Wilnette Kales, [place] 11/12/21 1705    Milton Ferguson, MD 11/17/21 1131

## 2021-11-12 NOTE — ED Triage Notes (Signed)
Pt with SOB since last night, cough noted in triage-productive at times.  Recent PNA per pt. Pt with COPD, has been using her inhalers.

## 2021-11-12 NOTE — ED Notes (Signed)
Patient finished antibiotics last week for recent dx of PNA, patient reports her breathing became more difficult last night

## 2021-11-13 ENCOUNTER — Encounter: Payer: Self-pay | Admitting: Family

## 2021-11-13 DIAGNOSIS — J9621 Acute and chronic respiratory failure with hypoxia: Secondary | ICD-10-CM | POA: Diagnosis not present

## 2021-11-13 DIAGNOSIS — J189 Pneumonia, unspecified organism: Secondary | ICD-10-CM | POA: Diagnosis not present

## 2021-11-13 DIAGNOSIS — J441 Chronic obstructive pulmonary disease with (acute) exacerbation: Secondary | ICD-10-CM | POA: Diagnosis not present

## 2021-11-13 LAB — BASIC METABOLIC PANEL
Anion gap: 6 (ref 5–15)
BUN: 17 mg/dL (ref 6–20)
CO2: 23 mmol/L (ref 22–32)
Calcium: 8.3 mg/dL — ABNORMAL LOW (ref 8.9–10.3)
Chloride: 108 mmol/L (ref 98–111)
Creatinine, Ser: 1.13 mg/dL — ABNORMAL HIGH (ref 0.44–1.00)
GFR, Estimated: >60 mL/min (ref >60)
Glucose, Bld: 151 mg/dL — ABNORMAL HIGH (ref 70–99)
Potassium: 4 mmol/L (ref 3.5–5.1)
Sodium: 137 mmol/L (ref 135–145)

## 2021-11-13 LAB — CBC
HCT: 32.7 % — ABNORMAL LOW (ref 36.0–46.0)
Hemoglobin: 9.6 g/dL — ABNORMAL LOW (ref 12.0–15.0)
MCH: 24.1 pg — ABNORMAL LOW (ref 26.0–34.0)
MCHC: 29.4 g/dL — ABNORMAL LOW (ref 30.0–36.0)
MCV: 82 fL (ref 80.0–100.0)
Platelets: 256 10*3/uL (ref 150–400)
RBC: 3.99 MIL/uL (ref 3.87–5.11)
RDW: 17.5 % — ABNORMAL HIGH (ref 11.5–15.5)
WBC: 13.8 10*3/uL — ABNORMAL HIGH (ref 4.0–10.5)
nRBC: 0 % (ref 0.0–0.2)

## 2021-11-13 MED ORDER — HYDROXYZINE HCL 25 MG PO TABS
25.0000 mg | ORAL_TABLET | Freq: Three times a day (TID) | ORAL | Status: DC | PRN
  Administered 2021-11-13: 25 mg via ORAL
  Filled 2021-11-13 (×2): qty 1

## 2021-11-13 MED ORDER — TRAZODONE HCL 50 MG PO TABS
100.0000 mg | ORAL_TABLET | Freq: Every day | ORAL | Status: DC
  Administered 2021-11-13 – 2021-11-14 (×2): 100 mg via ORAL
  Filled 2021-11-13 (×2): qty 2

## 2021-11-13 MED ORDER — SENNOSIDES-DOCUSATE SODIUM 8.6-50 MG PO TABS
2.0000 | ORAL_TABLET | Freq: Two times a day (BID) | ORAL | Status: DC
  Filled 2021-11-13 (×5): qty 2

## 2021-11-13 MED ORDER — METHYLPREDNISOLONE SODIUM SUCC 40 MG IJ SOLR
40.0000 mg | Freq: Two times a day (BID) | INTRAMUSCULAR | Status: DC
  Administered 2021-11-13 – 2021-11-15 (×5): 40 mg via INTRAVENOUS
  Filled 2021-11-13 (×5): qty 1

## 2021-11-13 NOTE — Progress Notes (Signed)
PROGRESS NOTE     [name], is a 46 y.o. female, DOB - Aug 05, 1975, VPX:[id number]  Admit date - 11/12/2021   Admitting Physician Kari Patricia, MD  Outpatient Primary MD for the patient is [name], [place]  LOS - 1  Chief Complaint  Patient presents with   Shortness of Breath        Brief Narrative:  46 y.o. female, with past medical history of COPD, still smoking, on 3 L nasal cannula at home admitted on 11/12/2021 with acute on chronic COPD exacerbation secondary to pneumonia    -Assessment and Plan: 1)Acute on chronic respiratory failure with hypoxia due to multifocal pneumonia resulting in COPD exacerbation -PTA patient was supposed to be on 3 L at home but she only uses as needed because she usually smokes and has to take off oxygen to smoke -Required up to 5 L of oxygen via nasal cannula -Today she is down back to 3 L of oxygen via nasal cannula -Continues to complain of subjective shortness of breath and wheezing -Threatening to leave [place] if we do not fix her breathing problem right away -Steroid therapy adjusted -Bronchodilators suggested -Please note that the CTA chest was without evidence of PE -Continue azithromycin and Rocephin for pneumonia -Incentive spirometry use encouraged -   2)Hypothyroidism -continue with Synthroid   3)Anxiety/depression -Anxiety appears was most likely due to steroids and bronchodilators -Patient emotional at times...Marland KitchenMarland KitchenMarland Kitchen raising her voice and requesting that we fix her breathing problem right now -Continue Prozac -Continue Xanax -May use hydroxyzine as needed -Give trazodone nightly   4)Tobacco abuse -Patient declines nicotine patch use  -Not very interested in smoking cessation counseling -Patient states that she can quit smoking if she wants to   5)GERD -Continue Protonix for GI prophylaxis especially with steroid use  6) chronic pain mostly in the back--- continue gabapentin and  oxycodone   Disposition/Need for in-Hospital Stay- patient unable to be discharged at this time due to --- bilateral pneumonia requiring IV antibiotics, COPD flareup requiring IV steroids and bronchodilators  Status is: Inpatient   Disposition: The patient is from: Home              Anticipated d/c is to: Home              Anticipated d/c date is: 2 days              Patient currently is not medically stable to d/c. Barriers: Not Clinically Stable-   Code Status : -  Code Status: Full Code   Family Communication:    NA (patient is alert, awake and coherent)   DVT Prophylaxis  :   - SCDs   enoxaparin (LOVENOX) injection 40 mg Start: 11/12/21 1845   Lab Results  Component Value Date   PLT 256 11/13/2021    Inpatient Medications  Scheduled Meds:  albuterol  2.5 mg Nebulization Once   enoxaparin (LOVENOX) injection  40 mg Subcutaneous Q24H   FLUoxetine  40 mg Oral Daily   fluticasone furoate-vilanterol  1 puff Inhalation Daily   gabapentin  300 mg Oral QID   ipratropium-albuterol  3 mL Nebulization Q6H   levothyroxine  175 mcg Oral Daily   methylPREDNISolone (SOLU-MEDROL) injection  40 mg Intravenous Q12H   montelukast  10 mg Oral QHS   pantoprazole  40 mg Oral Daily   senna-docusate  2 tablet Oral BID   Vitamin D (Ergocalciferol)  50,000 Units Oral Q7 days   Continuous Infusions:  azithromycin 500 mg (11/13/21 1615)   cefTRIAXone (ROCEPHIN)  IV 2 g (11/13/21 1419)   PRN Meds:.acetaminophen **OR** acetaminophen, albuterol, ALPRAZolam, ondansetron **OR** ondansetron (ZOFRAN) IV, oxyCODONE, senna-docusate   Anti-infectives (From admission, onward)    Start     Dose/Rate Route Frequency Ordered Stop   11/13/21 1400  cefTRIAXone (ROCEPHIN) 2 g in sodium chloride 0.9 % 100 mL IVPB        2 g 200 mL/hr over 30 Minutes Intravenous Every 24 hours 11/12/21 1757 11/18/21 1359   11/13/21 1400  azithromycin (ZITHROMAX) 500 mg in sodium chloride 0.9 % 250 mL IVPB        500  mg 250 mL/hr over 60 Minutes Intravenous Every 24 hours 11/12/21 1757 11/18/21 1359   11/12/21 1630  cefTRIAXone (ROCEPHIN) 1 g in sodium chloride 0.9 % 100 mL IVPB        1 g 200 mL/hr over 30 Minutes Intravenous  Once 11/12/21 1623 11/12/21 1717   11/12/21 1630  doxycycline (VIBRA-TABS) tablet 100 mg        100 mg Oral  Once 11/12/21 1623 11/12/21 1644         Subjective: [name] today has no fevers, no emesis,  No further chest pain,    Nurse Tech [name] at bedside with me -- -Pt is very Anxious- most likely due to steroids and bronchodilators -Patient is emotional at times...Marland KitchenMarland KitchenMarland Kitchen raising her voice and requesting that we fix her breathing problem right now - -Threatening to leave [place] if we do not fix her breathing problem right away -Apparently had shortness of breath and chest pains overnight,  no further chest pain today  -Despite encouragement and education patient took off her oxygen and walked  to the bathroom without her oxygen , she was in the bathroom for a while Nurse Tech [name] and I at patient's bedside we found patient without oxygen -I Placed patient back on 4 L of oxygen O2 sats came up to 97%, I weaned back down to 3 L of oxygen with O2 sat at 93% at this time - Patient is irritated and unhappy--- not very cooperative -Despite recurrent subjective complaints of increased shortness of breath , her O2 sats are pretty good actually on just 3 L of oxygen which is her baseline at home  Objective: Vitals:   11/13/21 0850 11/13/21 0851 11/13/21 1313 11/13/21 1407  BP:    115/68  Pulse:    86  Resp:    20  Temp:      TempSrc:      SpO2: 96% 96% 95% 95%  Weight:      Height:        Intake/Output Summary (Last 24 hours) at 11/13/2021 1822 Last data filed at 11/13/2021 0434 Gross per 24 hour  Intake 1739.79 ml  Output --  Net 1739.79 ml   Filed Weights   11/12/21 1350 11/12/21 1800  Weight: 95.7 kg 94.8 kg    Physical  Exam  Gen:- Awake Alert, anxious, speaking in complete sentences, no significant dyspnea on exertion after she returned from the bathroom HEENT:- [place].AT, No sclera icterus Nose- [place] 3L/min Neck-Supple Neck,No JVD,.  Lungs-scattered wheezes, fair symmetrical air movement CV- S1, S2 normal, regular  Abd-  +ve B.Sounds, Abd Soft, No tenderness,    Extremity/Skin:- No  edema, pedal pulses present  Psych-affect is very anxious, irritated,, oriented x3 Neuro-no new focal deficits, no tremors  Data Reviewed: I have personally reviewed following labs and imaging studies  CBC: Recent Labs  Lab 11/12/21 1408 11/13/21 0623  WBC 9.6 13.8*  NEUTROABS 8.6*  --   HGB 11.3* 9.6*  HCT 37.7 32.7*  MCV 79.9* 82.0  PLT 275 355   Basic Metabolic Panel: Recent Labs  Lab 11/12/21 1408 11/13/21 0623  NA 138 137  K 4.2 4.0  CL 104 108  CO2 24 23  GLUCOSE 123* 151*  BUN 10 17  CREATININE 0.71 1.13*  CALCIUM 9.6 8.3*   GFR: Estimated Creatinine Clearance: 68.7 mL/min (A) (by C-G formula based on SCr of 1.13 mg/dL (H)). Liver Function Tests: Recent Labs  Lab 11/12/21 1408  AST 20  ALT 17  ALKPHOS 66  BILITOT 0.6  PROT 8.1  ALBUMIN 4.3   Cardiac Enzymes: No results for input(s): "CKTOTAL", "CKMB", "CKMBINDEX", "TROPONINI" in the last 168 hours. BNP (last 3 results) No results for input(s): "PROBNP" in the last 8760 hours. HbA1C: No results for input(s): "HGBA1C" in the last 72 hours. Sepsis Labs: '@LABRCNTIP'$ (procalcitonin:4,lacticidven:4) ) Recent Results (from the past 240 hour(s))  Resp Panel by RT-PCR (Flu A&B, Covid) Anterior Nasal Swab     Status: None   Collection Time: 11/12/21  2:38 PM   Specimen: Anterior Nasal Swab  Result Value Ref Range Status   SARS Coronavirus 2 by RT PCR NEGATIVE NEGATIVE Final    Comment: (NOTE) SARS-CoV-2 target nucleic acids are NOT DETECTED.  The SARS-CoV-2 RNA is generally detectable in upper respiratory specimens during the acute phase of  infection. The lowest concentration of SARS-CoV-2 viral copies this assay can detect is 138 copies/mL. A negative result does not preclude SARS-Cov-2 infection and should not be used as the sole basis for treatment or other patient management decisions. A negative result may occur with  improper specimen collection/handling, submission of specimen other than nasopharyngeal swab, presence of viral mutation(s) within the areas targeted by this assay, and inadequate number of viral copies(<138 copies/mL). A negative result must be combined with clinical observations, patient history, and epidemiological information. The expected result is Negative.  Fact Sheet for Patients:  EntrepreneurPulse.com.au  Fact Sheet for Healthcare Providers:  IncredibleEmployment.be  This test is no t yet approved or cleared by the [place] FDA and  has been authorized for detection and/or diagnosis of SARS-CoV-2 by FDA under an Emergency Use Authorization (EUA). This EUA will remain  in effect (meaning this test can be used) for the duration of the COVID-19 declaration under Section 564(b)(1) of the Act, 21 U.S.C.section 360bbb-3(b)(1), unless the authorization is terminated  or revoked sooner.       Influenza A by PCR NEGATIVE NEGATIVE Final   Influenza B by PCR NEGATIVE NEGATIVE Final    Comment: (NOTE) The Xpert Xpress SARS-CoV-2/FLU/RSV plus assay is intended as an aid in the diagnosis of influenza from Nasopharyngeal swab specimens and should not be used as a sole basis for treatment. Nasal washings and aspirates are unacceptable for Xpert Xpress SARS-CoV-2/FLU/RSV testing.  Fact Sheet for Patients: EntrepreneurPulse.com.au  Fact Sheet for Healthcare Providers: IncredibleEmployment.be  This test is not yet approved or cleared by the [place] FDA and has been authorized for detection and/or diagnosis of SARS-CoV-2  by FDA under an Emergency Use Authorization (EUA). This EUA will remain in effect (meaning this test can be used) for the duration of the COVID-19 declaration under Section 564(b)(1) of the Act, 21 U.S.C. section 360bbb-3(b)(1), unless the authorization is terminated or revoked.  Performed at [facility], [address], [place], [place] [address]  Radiology Studies: CT Angio Chest PE W/Cm &/Or Wo Cm  Result Date: 11/12/2021 CLINICAL DATA:  PE suspected. Repeat scan due to lack of contrast in the pulmonary veins. Shortness of breath since last night. Patient reports recent pneumonia. EXAM: CT ANGIOGRAPHY CHEST WITH CONTRAST TECHNIQUE: Multidetector CT imaging of the chest was performed using the standard protocol during bolus administration of intravenous contrast. Multiplanar CT image reconstructions and MIPs were obtained to evaluate the vascular anatomy. RADIATION DOSE REDUCTION: This exam was performed according to the departmental dose-optimization program which includes automated exposure control, adjustment of the mA and/or kV according to patient size and/or use of iterative reconstruction technique. CONTRAST:  142m OMNIPAQUE IOHEXOL 350 MG/ML SOLN COMPARISON:  CT chest 05/27/2007 FINDINGS: Cardiovascular: Satisfactory opacification of the pulmonary arteries to the segmental level. No evidence of pulmonary embolism. Normal heart size. No pericardial effusion. The main pulmonary artery is dilated measuring up to 4 cm in diameter. Mediastinum/Nodes: No enlarged mediastinal, hilar, or axillary lymph nodes. Thyroid gland, trachea, and esophagus demonstrate no significant findings. Lungs/Pleura: Patchy airspace opacity is noted in the superior segment of the right lower lobe (series 9, image 72). Additional smaller patchy airspace opacities noted in the medial right lower lobe as well as the medial left lower lobe (series 9, image 74, 76). Linear subsegmental atelectasis noted in the medial  left upper lobe, lower right upper lobe anteriorly, lateral aspect of the right middle lobe, as well as the medial left lung base, similar to 05/27/2007. Two 0.5 cm ground-glass opacities are noted in the medial right upper lobe (series 9, images 28, 38). No pleural effusion or pneumothorax. Upper Abdomen: No acute abnormality. Musculoskeletal: No chest wall abnormality. No acute or significant osseous findings. Review of the MIP images confirms the above findings. IMPRESSION: 1. No evidence of pulmonary embolism. 2. Multifocal patchy airspace opacities, most prominent in the superior segment of the right lower lobe, consistent with multifocal pneumonia. 3. Two 0.5 cm ground-glass opacities in the medial right upper lobe, likely infectious/inflammatory given the multifocal opacities described above. 4. Dilated main pulmonary artery measuring up to 4 cm in diameter, suggestive of underlying pulmonary arterial hypertension. 5. Scattered areas of linear subsegmental atelectasis, similar to 05/27/2007. Electronically Signed   By: LIleana RoupM.D.   On: 11/12/2021 16:18   DG Chest Port 1 View  Result Date: 11/12/2021 CLINICAL DATA:  Shortness of breath, productive cough EXAM: PORTABLE CHEST 1 VIEW COMPARISON:  10/24/2021 FINDINGS: The heart size and mediastinal contours are within normal limits. Improved, although persistent scattered heterogeneous opacity of the lung bases. The visualized skeletal structures are unremarkable. IMPRESSION: Improved, although persistent scattered heterogeneous opacity of the lung bases, suggesting improved infection or aspiration. No new airspace opacity. Electronically Signed   By: ADelanna AhmadiM.D.   On: 11/12/2021 15:16     Scheduled Meds:  albuterol  2.5 mg Nebulization Once   enoxaparin (LOVENOX) injection  40 mg Subcutaneous Q24H   FLUoxetine  40 mg Oral Daily   fluticasone furoate-vilanterol  1 puff Inhalation Daily   gabapentin  300 mg Oral QID    ipratropium-albuterol  3 mL Nebulization Q6H   levothyroxine  175 mcg Oral Daily   methylPREDNISolone (SOLU-MEDROL) injection  40 mg Intravenous Q12H   montelukast  10 mg Oral QHS   pantoprazole  40 mg Oral Daily   senna-docusate  2 tablet Oral BID   Vitamin D (Ergocalciferol)  50,000 Units Oral Q7 days   Continuous Infusions:  azithromycin 500 mg (  11/13/21 1615)   cefTRIAXone (ROCEPHIN)  IV 2 g (11/13/21 1419)     LOS: 1 day    Roxan Hockey M.D on 11/13/2021 at 6:22 PM  Go to [email or web address] - for contact info  [facility] - Office  [phone]  If 7PM-7AM, please contact night-coverage [email or web address] 11/13/2021, 6:22 PM

## 2021-11-13 NOTE — Progress Notes (Signed)
Patient c/o feeling very SOB, feeling like she cannot get good breath. On assessment increased WOB and tachypnea is noted Expiratory wheezing. Patient had c/o chest pain overnight and states the pain and pressure is still there. States the only time she has relief is when she has breathing treatment but short time after treatment she becomes short of breath and unable to get comfortable again. Dr Denton Brick notified of patient condition.   11/13/21 0834  Vitals  Temp (!) 97.4 F (36.3 C)  Temp Source Oral  BP 107/71  MAP (mmHg) 82  BP Location Right Arm  BP Method Automatic  Patient Position (if appropriate) Sitting  Pulse Rate 84  Pulse Rate Source Dinamap  Resp (!) 26  Level of Consciousness  Level of Consciousness Alert  Oxygen Therapy  SpO2 96 %  O2 Device Nasal Cannula  O2 Flow Rate (L/min) 5 L/min  MEWS Score  MEWS Temp 0  MEWS Systolic 0  MEWS Pulse 0  MEWS RR 2  MEWS LOC 0  MEWS Score 2  MEWS Score Color Yellow

## 2021-11-13 NOTE — Progress Notes (Signed)
This RN informed Dr. Josph Macho- Lamont Snowball that patient's breathing is still labored and she is c/o 9/10 central chest pressure. Pt had minimal relief from PRN neb. Spo2 is 92-93%  on 5 L [place]. This RN requested Morphine for dyspnea. Dr. Josph Macho- Gosh orders Morphine, Magnesium Sulfate, and 1 hour long neb. Bryson Corona Edd Fabian

## 2021-11-13 NOTE — Progress Notes (Signed)
Pt more short of breath, accessory muscle use present, audible wheezing present. Chest symmetrical rise and fall. She c/o  central chest pressure. Spo2 91 % on 3L [place]. This RN called respiratory for PRN neb and increased O2 to 5 L [place]. EKG performed and showed Sinus Tachycardia.  Dr. Darrell Jewel was informed. Bryson Corona Edd Fabian

## 2021-11-13 NOTE — TOC Progression Note (Signed)
  Transition of Care (TOC) Screening Note   Patient Details  Name: [name] Date of Birth: 1975-04-15   Transition of Care [facility]) CM/SW Contact:    Shade Flood, LCSW Phone Number: 11/13/2021, 10:52 AM    Transition of Care Department [facility]) has reviewed patient and no TOC needs have been identified at this time. We will continue to monitor patient advancement through interdisciplinary progression rounds. If new patient transition needs arise, please place a TOC consult.

## 2021-11-14 DIAGNOSIS — J441 Chronic obstructive pulmonary disease with (acute) exacerbation: Secondary | ICD-10-CM | POA: Diagnosis not present

## 2021-11-14 DIAGNOSIS — J9621 Acute and chronic respiratory failure with hypoxia: Secondary | ICD-10-CM | POA: Diagnosis not present

## 2021-11-14 MED ORDER — IPRATROPIUM-ALBUTEROL 0.5-2.5 (3) MG/3ML IN SOLN
3.0000 mL | Freq: Three times a day (TID) | RESPIRATORY_TRACT | Status: DC
  Administered 2021-11-15 (×2): 3 mL via RESPIRATORY_TRACT
  Filled 2021-11-14 (×2): qty 3

## 2021-11-14 NOTE — Progress Notes (Signed)
PROGRESS NOTE     [name], is a 46 y.o. female, DOB - Sep 12, 1975, [id number]  Admit date - 11/12/2021   Admitting Physician Albertine Patricia, MD  Outpatient Primary MD for the patient is Eugenia Pancoast, [place]  LOS - 2  Chief Complaint  Patient presents with   Shortness of Breath        Brief Narrative:  46 y.o. female, with past medical history of COPD, still smoking, on 3 L nasal cannula at home admitted on 11/12/2021 with acute on chronic COPD exacerbation secondary to pneumonia    -Assessment and Plan: 1)Acute on chronic respiratory failure with hypoxia due to multifocal pneumonia resulting in COPD exacerbation -PTA patient was supposed to be on 3 L at home but she only uses as needed because she usually smokes and has to take off oxygen to smoke -She reports that on average day, she does not usually wear oxygen -Required up to 5 L of oxygen via nasal cannula -Currently she is on 4 L of oxygen via nasal cannula -She continues to cough and wheeze -Continue steroids, antibiotics -Bronchodilators suggested -Please note that the CTA chest was without evidence of PE -Continue azithromycin and Rocephin for pneumonia -Incentive spirometry use encouraged    2)Hypothyroidism -continue with Synthroid   3)Anxiety/depression -Anxiety appears was most likely due to steroids and bronchodilators -Patient emotional at times...Marland KitchenMarland KitchenMarland Kitchen raising her voice and requesting that we fix her breathing problem right now -Continue Prozac -Continue Xanax -May use hydroxyzine as needed -Give trazodone nightly   4)Tobacco abuse -Patient declines nicotine patch use  -Not very interested in smoking cessation counseling -Patient states that she can quit smoking if she wants to   5)GERD -Continue Protonix for GI prophylaxis especially with steroid use  6) chronic pain mostly in the back--- continue gabapentin and oxycodone   Disposition/Need for in-Hospital Stay- patient unable to  be discharged at this time due to --- bilateral pneumonia requiring IV antibiotics, COPD flareup requiring IV steroids and bronchodilators  Status is: Inpatient   Disposition: The patient is from: Home              Anticipated d/c is to: Home              Anticipated d/c date is: 2 days              Patient currently is not medically stable to d/c. Barriers: Not Clinically Stable-   Code Status : -  Code Status: Full Code   Family Communication:    NA (patient is alert, awake and coherent)   DVT Prophylaxis  :   - SCDs   enoxaparin (LOVENOX) injection 40 mg Start: 11/12/21 1845   Lab Results  Component Value Date   PLT 256 11/13/2021    Inpatient Medications  Scheduled Meds:  albuterol  2.5 mg Nebulization Once   enoxaparin (LOVENOX) injection  40 mg Subcutaneous Q24H   FLUoxetine  40 mg Oral Daily   fluticasone furoate-vilanterol  1 puff Inhalation Daily   gabapentin  300 mg Oral QID   [START ON 11/15/2021] ipratropium-albuterol  3 mL Nebulization TID   levothyroxine  175 mcg Oral Daily   methylPREDNISolone (SOLU-MEDROL) injection  40 mg Intravenous Q12H   montelukast  10 mg Oral QHS   pantoprazole  40 mg Oral Daily   senna-docusate  2 tablet Oral BID   traZODone  100 mg Oral QHS   Vitamin D (Ergocalciferol)  50,000 Units Oral Q7 days   Continuous  Infusions:  azithromycin 500 mg (11/14/21 1440)   cefTRIAXone (ROCEPHIN)  IV 2 g (11/14/21 1551)   PRN Meds:.acetaminophen **OR** acetaminophen, albuterol, ALPRAZolam, hydrOXYzine, ondansetron **OR** ondansetron (ZOFRAN) IV, oxyCODONE, senna-docusate   Anti-infectives (From admission, onward)    Start     Dose/Rate Route Frequency Ordered Stop   11/13/21 1400  cefTRIAXone (ROCEPHIN) 2 g in sodium chloride 0.9 % 100 mL IVPB        2 g 200 mL/hr over 30 Minutes Intravenous Every 24 hours 11/12/21 1757 11/18/21 1359   11/13/21 1400  azithromycin (ZITHROMAX) 500 mg in sodium chloride 0.9 % 250 mL IVPB        500 mg 250  mL/hr over 60 Minutes Intravenous Every 24 hours 11/12/21 1757 11/18/21 1359   11/12/21 1630  cefTRIAXone (ROCEPHIN) 1 g in sodium chloride 0.9 % 100 mL IVPB        1 g 200 mL/hr over 30 Minutes Intravenous  Once 11/12/21 1623 11/12/21 1717   11/12/21 1630  doxycycline (VIBRA-TABS) tablet 100 mg        100 mg Oral  Once 11/12/21 1623 11/12/21 1644         Subjective: She still short of breath, wheezing and coughing.  Does not feel her breathing is back to baseline yet.  Objective: Vitals:   11/14/21 1056 11/14/21 1254 11/14/21 1430 11/14/21 2019  BP:  116/71    Pulse: 85 91 87   Resp: '16 20 18   '$ Temp:  97.7 F (36.5 C)    TempSrc:  Oral    SpO2:  100% 100% 96%  Weight:      Height:        Intake/Output Summary (Last 24 hours) at 11/14/2021 2023 Last data filed at 11/14/2021 1600 Gross per 24 hour  Intake 379.23 ml  Output --  Net 379.23 ml   Filed Weights   11/12/21 1350 11/12/21 1800  Weight: 95.7 kg 94.8 kg    Physical Exam  Gen:- Awake Alert, anxious, speaking in complete sentences, no significant dyspnea on exertion after she returned from the bathroom HEENT:- [place].AT, No sclera icterus Nose- [place] 4L/min Neck-Supple Neck,No JVD,.  Lungs-scattered wheezes, fair symmetrical air movement CV- S1, S2 normal, regular  Abd-  +ve B.Sounds, Abd Soft, No tenderness,    Extremity/Skin:- No  edema, pedal pulses present  Psych-affect is very anxious, irritated,, oriented x3 Neuro-no new focal deficits, no tremors  Data Reviewed: I have personally reviewed following labs and imaging studies  CBC: Recent Labs  Lab 11/12/21 1408 11/13/21 0623  WBC 9.6 13.8*  NEUTROABS 8.6*  --   HGB 11.3* 9.6*  HCT 37.7 32.7*  MCV 79.9* 82.0  PLT 275 878   Basic Metabolic Panel: Recent Labs  Lab 11/12/21 1408 11/13/21 0623  NA 138 137  K 4.2 4.0  CL 104 108  CO2 24 23  GLUCOSE 123* 151*  BUN 10 17  CREATININE 0.71 1.13*  CALCIUM 9.6 8.3*   GFR: Estimated Creatinine  Clearance: 68.7 mL/min (A) (by C-G formula based on SCr of 1.13 mg/dL (H)). Liver Function Tests: Recent Labs  Lab 11/12/21 1408  AST 20  ALT 17  ALKPHOS 66  BILITOT 0.6  PROT 8.1  ALBUMIN 4.3   Cardiac Enzymes: No results for input(s): "CKTOTAL", "CKMB", "CKMBINDEX", "TROPONINI" in the last 168 hours. BNP (last 3 results) No results for input(s): "PROBNP" in the last 8760 hours. HbA1C: No results for input(s): "HGBA1C" in the last 72 hours. Sepsis Labs: @  LABRCNTIP(procalcitonin:4,lacticidven:4) ) Recent Results (from the past 240 hour(s))  Resp Panel by RT-PCR (Flu A&B, Covid) Anterior Nasal Swab     Status: None   Collection Time: 11/12/21  2:38 PM   Specimen: Anterior Nasal Swab  Result Value Ref Range Status   SARS Coronavirus 2 by RT PCR NEGATIVE NEGATIVE Final    Comment: (NOTE) SARS-CoV-2 target nucleic acids are NOT DETECTED.  The SARS-CoV-2 RNA is generally detectable in upper respiratory specimens during the acute phase of infection. The lowest concentration of SARS-CoV-2 viral copies this assay can detect is 138 copies/mL. A negative result does not preclude SARS-Cov-2 infection and should not be used as the sole basis for treatment or other patient management decisions. A negative result may occur with  improper specimen collection/handling, submission of specimen other than nasopharyngeal swab, presence of viral mutation(s) within the areas targeted by this assay, and inadequate number of viral copies(<138 copies/mL). A negative result must be combined with clinical observations, patient history, and epidemiological information. The expected result is Negative.  Fact Sheet for Patients:  EntrepreneurPulse.com.au  Fact Sheet for Healthcare Providers:  IncredibleEmployment.be  This test is no t yet approved or cleared by the [place] FDA and  has been authorized for detection and/or diagnosis of SARS-CoV-2 by FDA  under an Emergency Use Authorization (EUA). This EUA will remain  in effect (meaning this test can be used) for the duration of the COVID-19 declaration under Section 564(b)(1) of the Act, 21 U.S.C.section 360bbb-3(b)(1), unless the authorization is terminated  or revoked sooner.       Influenza A by PCR NEGATIVE NEGATIVE Final   Influenza B by PCR NEGATIVE NEGATIVE Final    Comment: (NOTE) The Xpert Xpress SARS-CoV-2/FLU/RSV plus assay is intended as an aid in the diagnosis of influenza from Nasopharyngeal swab specimens and should not be used as a sole basis for treatment. Nasal washings and aspirates are unacceptable for Xpert Xpress SARS-CoV-2/FLU/RSV testing.  Fact Sheet for Patients: EntrepreneurPulse.com.au  Fact Sheet for Healthcare Providers: IncredibleEmployment.be  This test is not yet approved or cleared by the [place] FDA and has been authorized for detection and/or diagnosis of SARS-CoV-2 by FDA under an Emergency Use Authorization (EUA). This EUA will remain in effect (meaning this test can be used) for the duration of the COVID-19 declaration under Section 564(b)(1) of the Act, 21 U.S.C. section 360bbb-3(b)(1), unless the authorization is terminated or revoked.  Performed at [facility], [address], [place],  [address]       Radiology Studies: No results found.   Scheduled Meds:  albuterol  2.5 mg Nebulization Once   enoxaparin (LOVENOX) injection  40 mg Subcutaneous Q24H   FLUoxetine  40 mg Oral Daily   fluticasone furoate-vilanterol  1 puff Inhalation Daily   gabapentin  300 mg Oral QID   [START ON 11/15/2021] ipratropium-albuterol  3 mL Nebulization TID   levothyroxine  175 mcg Oral Daily   methylPREDNISolone (SOLU-MEDROL) injection  40 mg Intravenous Q12H   montelukast  10 mg Oral QHS   pantoprazole  40 mg Oral Daily   senna-docusate  2 tablet Oral BID   traZODone  100 mg Oral QHS   Vitamin D  (Ergocalciferol)  50,000 Units Oral Q7 days   Continuous Infusions:  azithromycin 500 mg (11/14/21 1440)   cefTRIAXone (ROCEPHIN)  IV 2 g (11/14/21 1551)     LOS: 2 days    Kathie Dike M.D on 11/14/2021 at 8:23 PM  Go to [email or web address] -  for contact info  [facility] - Office  [phone]  If 7PM-7AM, please contact night-coverage [email or web address] 11/14/2021, 8:23 PM

## 2021-11-15 ENCOUNTER — Encounter: Payer: Self-pay | Admitting: Family

## 2021-11-15 DIAGNOSIS — J9621 Acute and chronic respiratory failure with hypoxia: Secondary | ICD-10-CM | POA: Diagnosis not present

## 2021-11-15 LAB — CBC
HCT: 30.1 % — ABNORMAL LOW (ref 36.0–46.0)
Hemoglobin: 8.7 g/dL — ABNORMAL LOW (ref 12.0–15.0)
MCH: 24.1 pg — ABNORMAL LOW (ref 26.0–34.0)
MCHC: 28.9 g/dL — ABNORMAL LOW (ref 30.0–36.0)
MCV: 83.4 fL (ref 80.0–100.0)
Platelets: 230 10*3/uL (ref 150–400)
RBC: 3.61 MIL/uL — ABNORMAL LOW (ref 3.87–5.11)
RDW: 18 % — ABNORMAL HIGH (ref 11.5–15.5)
WBC: 8.1 10*3/uL (ref 4.0–10.5)
nRBC: 0 % (ref 0.0–0.2)

## 2021-11-15 LAB — BASIC METABOLIC PANEL
Anion gap: 6 (ref 5–15)
BUN: 15 mg/dL (ref 6–20)
CO2: 26 mmol/L (ref 22–32)
Calcium: 8.4 mg/dL — ABNORMAL LOW (ref 8.9–10.3)
Chloride: 108 mmol/L (ref 98–111)
Creatinine, Ser: 0.55 mg/dL (ref 0.44–1.00)
GFR, Estimated: >60 mL/min (ref >60)
Glucose, Bld: 117 mg/dL — ABNORMAL HIGH (ref 70–99)
Potassium: 5 mmol/L (ref 3.5–5.1)
Sodium: 140 mmol/L (ref 135–145)

## 2021-11-15 MED ORDER — PREDNISONE 20 MG PO TABS: 40.0000 mg | ORAL_TABLET | Freq: Every day | ORAL | 0 refills | Status: AC

## 2021-11-15 MED ORDER — AMOXICILLIN-POT CLAVULANATE 875-125 MG PO TABS: 1.0000 | ORAL_TABLET | Freq: Two times a day (BID) | ORAL | 0 refills | Status: AC

## 2021-11-15 NOTE — Discharge Summary (Signed)
Physician Discharge Summary  [name] RWE:[id number] DOB: 1975-12-24 DOA: 11/12/2021  PCP: Eugenia Pancoast, FNP  Admit date: 11/12/2021  Discharge date: 11/15/2021  Admitted From:Home  Disposition:  Home  Recommendations for Outpatient Follow-up:  Follow up with PCP in 1-2 weeks Follow-up with pulmonology with referral sent locally Continue Augmentin and prednisone as prescribed for 5 more days to complete course of treatment Continue home breathing treatments as prior Counseled on smoking cessation Other home medications as prior  Home Health: None  Equipment/Devices: Has home 3 L nasal cannula oxygen  Discharge Condition:Stable  CODE STATUS: Full  Diet recommendation: Heart Healthy  Brief/Interim Summary: 46 y.o. female, with past medical history of COPD, still smoking, on 3 L nasal cannula at home admitted on 11/12/2021 with acute on chronic COPD exacerbation secondary to pneumonia.  She required up to 5 L nasal cannula oxygen and is back to her baseline 3 L nasal cannula at this time.  She continues to have minimal cough and wheezing, but she states that this is a chronic issue for her and continues to use tobacco at home.  She was treated with azithromycin and Rocephin for her pneumonia and was also on IV Solu-Medrol as well as scheduled breathing treatments.  She states that she is feeling back to her usual baseline and appears stable for discharge to home at this time.  No other acute events or concerns noted.  Discharge Diagnoses:  Active Problems:   Hypothyroidism   Tobacco abuse   Alcohol dependence ([place])   Acute on chronic respiratory failure with hypoxia ([facility])   Pneumonia due to infectious organism   COPD exacerbation ([place])  Principal discharge diagnosis: Acute on chronic hypoxemic respiratory failure secondary to multifocal pneumonia resulting in COPD exacerbation.  Discharge Instructions  Discharge Instructions     Ambulatory referral to Pulmonology    Complete by: As directed    Reason for referral: Asthma/COPD   Diet - low sodium heart healthy   Complete by: As directed    Increase activity slowly   Complete by: As directed       Allergies as of 11/15/2021   No Known Allergies      Medication List     TAKE these medications    albuterol 108 (90 Base) MCG/ACT inhaler Commonly known as: VENTOLIN HFA Inhale 1-2 puffs into the lungs every 6 (six) hours as needed for wheezing or shortness of breath.   albuterol (2.5 MG/3ML) 0.083% nebulizer solution Commonly known as: PROVENTIL Take 3 mLs (2.5 mg total) by nebulization every 6 (six) hours as needed for wheezing or shortness of breath. For asthma   ALPRAZolam 0.25 MG tablet Commonly known as: XANAX Take 0.25 mg by mouth 2 (two) times daily as needed for anxiety or sleep.   amoxicillin-clavulanate 875-125 MG tablet Commonly known as: AUGMENTIN Take 1 tablet by mouth 2 (two) times daily for 5 days.   budesonide-formoterol 80-4.5 MCG/ACT inhaler Commonly known as: SYMBICORT Inhale 2 puffs into the lungs 2 (two) times daily.   FLUoxetine 40 MG capsule Commonly known as: PROZAC Take 1 capsule (40 mg total) by mouth daily.   gabapentin 300 MG capsule Commonly known as: NEURONTIN Take 1 capsule (300 mg total) by mouth 4 (four) times daily.   levothyroxine 175 MCG tablet Commonly known as: SYNTHROID Take 1 tablet (175 mcg total) by mouth daily.   meloxicam 7.5 MG tablet Commonly known as: MOBIC Take 1 tablet (7.5 mg total) by mouth 2 (two) times daily.  montelukast 10 MG tablet Commonly known as: SINGULAIR Take 1 tablet (10 mg total) by mouth at bedtime.   pantoprazole 40 MG tablet Commonly known as: PROTONIX Take 1 tablet (40 mg total) by mouth daily.   predniSONE 20 MG tablet Commonly known as: DELTASONE Take 2 tablets (40 mg total) by mouth daily for 5 days.   Vitamin D (Ergocalciferol) 1.25 MG (50000 UNIT) Caps capsule Commonly known as: DRISDOL Take 1  capsule (50,000 Units total) by mouth every 7 (seven) days for 8 doses.        Follow-up Information     Eugenia Pancoast, FNP. Schedule an appointment as soon as possible for a visit in 1 week(s).   Specialty: Family Medicine Contact information: [place] [address] [phone]                No Known Allergies  Consultations: None   Procedures/Studies: CT Angio Chest PE W/Cm &/Or Wo Cm  Result Date: 11/12/2021 CLINICAL DATA:  PE suspected. Repeat scan due to lack of contrast in the pulmonary veins. Shortness of breath since last night. Patient reports recent pneumonia. EXAM: CT ANGIOGRAPHY CHEST WITH CONTRAST TECHNIQUE: Multidetector CT imaging of the chest was performed using the standard protocol during bolus administration of intravenous contrast. Multiplanar CT image reconstructions and MIPs were obtained to evaluate the vascular anatomy. RADIATION DOSE REDUCTION: This exam was performed according to the departmental dose-optimization program which includes automated exposure control, adjustment of the mA and/or kV according to patient size and/or use of iterative reconstruction technique. CONTRAST:  118m OMNIPAQUE IOHEXOL 350 MG/ML SOLN COMPARISON:  CT chest 05/27/2007 FINDINGS: Cardiovascular: Satisfactory opacification of the pulmonary arteries to the segmental level. No evidence of pulmonary embolism. Normal heart size. No pericardial effusion. The main pulmonary artery is dilated measuring up to 4 cm in diameter. Mediastinum/Nodes: No enlarged mediastinal, hilar, or axillary lymph nodes. Thyroid gland, trachea, and esophagus demonstrate no significant findings. Lungs/Pleura: Patchy airspace opacity is noted in the superior segment of the right lower lobe (series 9, image 72). Additional smaller patchy airspace opacities noted in the medial right lower lobe as well as the medial left lower lobe (series 9, image 74, 76). Linear subsegmental atelectasis  noted in the medial left upper lobe, lower right upper lobe anteriorly, lateral aspect of the right middle lobe, as well as the medial left lung base, similar to 05/27/2007. Two 0.5 cm ground-glass opacities are noted in the medial right upper lobe (series 9, images 28, 38). No pleural effusion or pneumothorax. Upper Abdomen: No acute abnormality. Musculoskeletal: No chest wall abnormality. No acute or significant osseous findings. Review of the MIP images confirms the above findings. IMPRESSION: 1. No evidence of pulmonary embolism. 2. Multifocal patchy airspace opacities, most prominent in the superior segment of the right lower lobe, consistent with multifocal pneumonia. 3. Two 0.5 cm ground-glass opacities in the medial right upper lobe, likely infectious/inflammatory given the multifocal opacities described above. 4. Dilated main pulmonary artery measuring up to 4 cm in diameter, suggestive of underlying pulmonary arterial hypertension. 5. Scattered areas of linear subsegmental atelectasis, similar to 05/27/2007. Electronically Signed   By: LIleana RoupM.D.   On: 11/12/2021 16:18   DG Chest Port 1 View  Result Date: 11/12/2021 CLINICAL DATA:  Shortness of breath, productive cough EXAM: PORTABLE CHEST 1 VIEW COMPARISON:  10/24/2021 FINDINGS: The heart size and mediastinal contours are within normal limits. Improved, although persistent scattered heterogeneous opacity of the lung bases.  The visualized skeletal structures are unremarkable. IMPRESSION: Improved, although persistent scattered heterogeneous opacity of the lung bases, suggesting improved infection or aspiration. No new airspace opacity. Electronically Signed   By: Delanna Ahmadi [name]   On: 11/12/2021 15:16   DG Ankle Complete Left  Result Date: 10/24/2021 CLINICAL DATA:  Left ankle pain and swelling EXAM: LEFT ANKLE COMPLETE - 3+ VIEW COMPARISON:  None Available. FINDINGS: Normal alignment without acute osseous finding or fracture. Distal  tibia, distal fibula, talus and calcaneus appear intact. Moderate degenerative arthropathy of the talonavicular joint with joint space loss, sclerosis and dorsal osteophytes. This is best demonstrated on the lateral view. IMPRESSION: No acute abnormality. Talonavicular degenerative arthropathy. Electronically Signed   By: Jerilynn Mages.  Shick [name]   On: 10/24/2021 20:28   DG Chest 2 View  Result Date: 10/24/2021 CLINICAL DATA:  Wheezing, history of COPD EXAM: CHEST - 2 VIEW COMPARISON:  07/27/2021 FINDINGS: Stable heart size and vascularity. Similar chronic peribronchial thickening and basilar interstitial changes. Slight increased right basilar opacity may be related to increased atelectasis but difficult to exclude basilar pneumonia. Negative for edema, effusion or pneumothorax. Trachea midline. No acute osseous finding. IMPRESSION: Chronic central bronchitic changes and basilar interstitial prominence. New right basilar streaky opacity compatible with atelectasis versus early basilar pneumonia. Electronically Signed   By: Jerilynn Mages.  Shick [name]   On: 10/24/2021 20:27     Discharge Exam: Vitals:   11/15/21 0541 11/15/21 0732  BP: (!) 94/56   Pulse: 71 73  Resp: 18 18  Temp: 97.7 F (36.5 C)   SpO2: 98% 99%   Vitals:   11/14/21 2100 11/14/21 2300 11/15/21 0541 11/15/21 0732  BP: 105/60 (!) 92/52 (!) 94/56   Pulse: 78 79 71 73  Resp: '20 18 18 18  '$ Temp: 98.1 F (36.7 C) 98.1 F (36.7 C) 97.7 F (36.5 C)   TempSrc: Oral Oral Oral   SpO2: 98% 96% 98% 99%  Weight:      Height:        General: Pt is alert, awake, not in acute distress Cardiovascular: RRR, S1/S2 +, no rubs, no gallops Respiratory: CTA bilaterally, no wheezing, no rhonchi, 3 L nasal cannula oxygen Abdominal: Soft, NT, ND, bowel sounds + Extremities: no edema, no cyanosis    The results of significant diagnostics from this hospitalization (including imaging, microbiology, ancillary and laboratory) are listed below for reference.      Microbiology: Recent Results (from the past 240 hour(s))  Resp Panel by RT-PCR (Flu A&B, Covid) Anterior Nasal Swab     Status: None   Collection Time: 11/12/21  2:38 PM   Specimen: Anterior Nasal Swab  Result Value Ref Range Status   SARS Coronavirus 2 by RT PCR NEGATIVE NEGATIVE Final    Comment: (NOTE) SARS-CoV-2 target nucleic acids are NOT DETECTED.  The SARS-CoV-2 RNA is generally detectable in upper respiratory specimens during the acute phase of infection. The lowest concentration of SARS-CoV-2 viral copies this assay can detect is 138 copies/mL. A negative result does not preclude SARS-Cov-2 infection and should not be used as the sole basis for treatment or other patient management decisions. A negative result may occur with  improper specimen collection/handling, submission of specimen other than nasopharyngeal swab, presence of viral mutation(s) within the areas targeted by this assay, and inadequate number of viral copies(<138 copies/mL). A negative result must be combined with clinical observations, patient history, and epidemiological information. The expected result is Negative.  Fact Sheet for Patients:  EntrepreneurPulse.com.au  Fact Sheet for Healthcare Providers:  IncredibleEmployment.be  This test is no t yet approved or cleared by the [place] FDA and  has been authorized for detection and/or diagnosis of SARS-CoV-2 by FDA under an Emergency Use Authorization (EUA). This EUA will remain  in effect (meaning this test can be used) for the duration of the COVID-19 declaration under Section 564(b)(1) of the Act, 21 U.S.C.section 360bbb-3(b)(1), unless the authorization is terminated  or revoked sooner.       Influenza A by PCR NEGATIVE NEGATIVE Final   Influenza B by PCR NEGATIVE NEGATIVE Final    Comment: (NOTE) The Xpert Xpress SARS-CoV-2/FLU/RSV plus assay is intended as an aid in the diagnosis of influenza  from Nasopharyngeal swab specimens and should not be used as a sole basis for treatment. Nasal washings and aspirates are unacceptable for Xpert Xpress SARS-CoV-2/FLU/RSV testing.  Fact Sheet for Patients: EntrepreneurPulse.com.au  Fact Sheet for Healthcare Providers: IncredibleEmployment.be  This test is not yet approved or cleared by the [place] FDA and has been authorized for detection and/or diagnosis of SARS-CoV-2 by FDA under an Emergency Use Authorization (EUA). This EUA will remain in effect (meaning this test can be used) for the duration of the COVID-19 declaration under Section 564(b)(1) of the Act, 21 U.S.C. section 360bbb-3(b)(1), unless the authorization is terminated or revoked.  Performed at [facility], [address] [place], [place] [address]      Labs: BNP (last 3 results) No results for input(s): "BNP" in the last 8760 hours. Basic Metabolic Panel: Recent Labs  Lab 11/12/21 1408 11/13/21 0623 11/15/21 0457  NA 138 137 140  K 4.2 4.0 5.0  CL 104 108 108  CO2 '24 23 26  '$ GLUCOSE 123* 151* 117*  BUN '10 17 15  '$ CREATININE 0.71 1.13* 0.55  CALCIUM 9.6 8.3* 8.4*   Liver Function Tests: Recent Labs  Lab 11/12/21 1408  AST 20  ALT 17  ALKPHOS 66  BILITOT 0.6  PROT 8.1  ALBUMIN 4.3   No results for input(s): "LIPASE", "AMYLASE" in the last 168 hours. No results for input(s): "AMMONIA" in the last 168 hours. CBC: Recent Labs  Lab 11/12/21 1408 11/13/21 0623 11/15/21 0457  WBC 9.6 13.8* 8.1  NEUTROABS 8.6*  --   --   HGB 11.3* 9.6* 8.7*  HCT 37.7 32.7* 30.1*  MCV 79.9* 82.0 83.4  PLT 275 256 230   Cardiac Enzymes: No results for input(s): "CKTOTAL", "CKMB", "CKMBINDEX", "TROPONINI" in the last 168 hours. BNP: Invalid input(s): "POCBNP" CBG: No results for input(s): "GLUCAP" in the last 168 hours. D-Dimer No results for input(s): "DDIMER" in the last 72 hours. Hgb A1c No results for input(s):  "HGBA1C" in the last 72 hours. Lipid Profile No results for input(s): "CHOL", "HDL", "LDLCALC", "TRIG", "CHOLHDL", "LDLDIRECT" in the last 72 hours. Thyroid function studies No results for input(s): "TSH", "T4TOTAL", "T3FREE", "THYROIDAB" in the last 72 hours.  Invalid input(s): "FREET3" Anemia work up No results for input(s): "VITAMINB12", "FOLATE", "FERRITIN", "TIBC", "IRON", "RETICCTPCT" in the last 72 hours. Urinalysis    Component Value Date/Time   COLORURINE YELLOW 09/19/2019 1014   APPEARANCEUR HAZY (A) 09/19/2019 1014   APPEARANCEUR Hazy 05/10/2013 1216   LABSPEC 1.010 09/19/2019 1014   LABSPEC 1.014 05/10/2013 1216   PHURINE 7.0 09/19/2019 [place] 09/19/2019 1014   GLUCOSEU Negative 05/10/2013 [place] 09/19/2019 [place] 09/19/2019 1014   BILIRUBINUR Negative 05/10/2013 1216   KETONESUR  NEGATIVE 09/19/2019 1014   PROTEINUR NEGATIVE 09/19/2019 1014   UROBILINOGEN 0.2 09/10/2011 0250   NITRITE NEGATIVE 09/19/2019 1014   LEUKOCYTESUR TRACE (A) 09/19/2019 1014   LEUKOCYTESUR Negative 05/10/2013 1216   Sepsis Labs Recent Labs  Lab 11/12/21 1408 11/13/21 0623 11/15/21 0457  WBC 9.6 13.8* 8.1   Microbiology Recent Results (from the past 240 hour(s))  Resp Panel by RT-PCR (Flu A&B, Covid) Anterior Nasal Swab     Status: None   Collection Time: 11/12/21  2:38 PM   Specimen: Anterior Nasal Swab  Result Value Ref Range Status   SARS Coronavirus 2 by RT PCR NEGATIVE NEGATIVE Final    Comment: (NOTE) SARS-CoV-2 target nucleic acids are NOT DETECTED.  The SARS-CoV-2 RNA is generally detectable in upper respiratory specimens during the acute phase of infection. The lowest concentration of SARS-CoV-2 viral copies this assay can detect is 138 copies/mL. A negative result does not preclude SARS-Cov-2 infection and should not be used as the sole basis for treatment or other patient management decisions. A negative result may  occur with  improper specimen collection/handling, submission of specimen other than nasopharyngeal swab, presence of viral mutation(s) within the areas targeted by this assay, and inadequate number of viral copies(<138 copies/mL). A negative result must be combined with clinical observations, patient history, and epidemiological information. The expected result is Negative.  Fact Sheet for Patients:  EntrepreneurPulse.com.au  Fact Sheet for Healthcare Providers:  IncredibleEmployment.be  This test is no t yet approved or cleared by the [place] FDA and  has been authorized for detection and/or diagnosis of SARS-CoV-2 by FDA under an Emergency Use Authorization (EUA). This EUA will remain  in effect (meaning this test can be used) for the duration of the COVID-19 declaration under Section 564(b)(1) of the Act, 21 U.S.C.section 360bbb-3(b)(1), unless the authorization is terminated  or revoked sooner.       Influenza A by PCR NEGATIVE NEGATIVE Final   Influenza B by PCR NEGATIVE NEGATIVE Final    Comment: (NOTE) The Xpert Xpress SARS-CoV-2/FLU/RSV plus assay is intended as an aid in the diagnosis of influenza from Nasopharyngeal swab specimens and should not be used as a sole basis for treatment. Nasal washings and aspirates are unacceptable for Xpert Xpress SARS-CoV-2/FLU/RSV testing.  Fact Sheet for Patients: EntrepreneurPulse.com.au  Fact Sheet for Healthcare Providers: IncredibleEmployment.be  This test is not yet approved or cleared by the [place] FDA and has been authorized for detection and/or diagnosis of SARS-CoV-2 by FDA under an Emergency Use Authorization (EUA). This EUA will remain in effect (meaning this test can be used) for the duration of the COVID-19 declaration under Section 564(b)(1) of the Act, 21 U.S.C. section 360bbb-3(b)(1), unless the authorization is terminated  or revoked.  Performed at [facility], [address], [place], [place] [address]      Time coordinating discharge: 35 minutes  SIGNED:   Rodena Goldmann, DO [facility] 11/15/2021, 9:10 AM  If 7PM-7AM, please contact night-coverage [email or web address]

## 2021-11-15 NOTE — Progress Notes (Signed)
[facility] Discharge Note  Admit Date:  11/12/2021 Discharge date: 11/15/2021   [name] A [place] to be D/C'd Home per MD order.  AVS completed. Patient/caregiver able to verbalize understanding.  Discharge Medication: Allergies as of 11/15/2021   No Known Allergies      Medication List     TAKE these medications    albuterol 108 (90 Base) MCG/ACT inhaler Commonly known as: VENTOLIN HFA Inhale 1-2 puffs into the lungs every 6 (six) hours as needed for wheezing or shortness of breath.   albuterol (2.5 MG/3ML) 0.083% nebulizer solution Commonly known as: PROVENTIL Take 3 mLs (2.5 mg total) by nebulization every 6 (six) hours as needed for wheezing or shortness of breath. For asthma   ALPRAZolam 0.25 MG tablet Commonly known as: XANAX Take 0.25 mg by mouth 2 (two) times daily as needed for anxiety or sleep.   amoxicillin-clavulanate 875-125 MG tablet Commonly known as: AUGMENTIN Take 1 tablet by mouth 2 (two) times daily for 5 days.   budesonide-formoterol 80-4.5 MCG/ACT inhaler Commonly known as: SYMBICORT Inhale 2 puffs into the lungs 2 (two) times daily.   FLUoxetine 40 MG capsule Commonly known as: PROZAC Take 1 capsule (40 mg total) by mouth daily.   gabapentin 300 MG capsule Commonly known as: NEURONTIN Take 1 capsule (300 mg total) by mouth 4 (four) times daily.   levothyroxine 175 MCG tablet Commonly known as: SYNTHROID Take 1 tablet (175 mcg total) by mouth daily.   meloxicam 7.5 MG tablet Commonly known as: MOBIC Take 1 tablet (7.5 mg total) by mouth 2 (two) times daily.   montelukast 10 MG tablet Commonly known as: SINGULAIR Take 1 tablet (10 mg total) by mouth at bedtime.   pantoprazole 40 MG tablet Commonly known as: PROTONIX Take 1 tablet (40 mg total) by mouth daily.   predniSONE 20 MG tablet Commonly known as: DELTASONE Take 2 tablets (40 mg total) by mouth daily for 5 days.   Vitamin D (Ergocalciferol) 1.25 MG (50000 UNIT) Caps  capsule Commonly known as: DRISDOL Take 1 capsule (50,000 Units total) by mouth every 7 (seven) days for 8 doses.        Discharge Assessment: Vitals:   11/15/21 0732 11/15/21 1132  BP:    Pulse: 73 92  Resp: 18 18  Temp:    SpO2: 99%    Skin clean, dry and intact without evidence of skin break down, no evidence of skin tears noted. IV catheter discontinued intact. Site without signs and symptoms of complications - no redness or edema noted at insertion site, patient denies c/o pain - only slight tenderness at site.  Dressing with slight pressure applied.  D/c Instructions-Education: Discharge instructions given to patient/family with verbalized understanding. D/c education completed with patient/family including follow up instructions, medication list, d/c activities limitations if indicated, with other d/c instructions as indicated by MD - patient able to verbalize understanding, all questions fully answered. Patient instructed to return to ED, call 911, or call MD for any changes in condition.  Patient escorted via [place], and D/C home via private auto.  Tsosie Billing, RN 11/15/2021 11:50 AM [facility] Discharge Note  Admit Date:  11/12/2021 Discharge date: 11/15/2021   [name] A [place] to be D/C'd Home per MD order.  AVS completed. Patient/caregiver able to verbalize understanding.  Discharge Medication: Allergies as of 11/15/2021   No Known Allergies      Medication List     TAKE these medications    albuterol 108 (90 Base) MCG/ACT  inhaler Commonly known as: VENTOLIN HFA Inhale 1-2 puffs into the lungs every 6 (six) hours as needed for wheezing or shortness of breath.   albuterol (2.5 MG/3ML) 0.083% nebulizer solution Commonly known as: PROVENTIL Take 3 mLs (2.5 mg total) by nebulization every 6 (six) hours as needed for wheezing or shortness of breath. For asthma   ALPRAZolam 0.25 MG tablet Commonly known as: XANAX Take 0.25 mg by mouth 2 (two) times daily as  needed for anxiety or sleep.   amoxicillin-clavulanate 875-125 MG tablet Commonly known as: AUGMENTIN Take 1 tablet by mouth 2 (two) times daily for 5 days.   budesonide-formoterol 80-4.5 MCG/ACT inhaler Commonly known as: SYMBICORT Inhale 2 puffs into the lungs 2 (two) times daily.   FLUoxetine 40 MG capsule Commonly known as: PROZAC Take 1 capsule (40 mg total) by mouth daily.   gabapentin 300 MG capsule Commonly known as: NEURONTIN Take 1 capsule (300 mg total) by mouth 4 (four) times daily.   levothyroxine 175 MCG tablet Commonly known as: SYNTHROID Take 1 tablet (175 mcg total) by mouth daily.   meloxicam 7.5 MG tablet Commonly known as: MOBIC Take 1 tablet (7.5 mg total) by mouth 2 (two) times daily.   montelukast 10 MG tablet Commonly known as: SINGULAIR Take 1 tablet (10 mg total) by mouth at bedtime.   pantoprazole 40 MG tablet Commonly known as: PROTONIX Take 1 tablet (40 mg total) by mouth daily.   predniSONE 20 MG tablet Commonly known as: DELTASONE Take 2 tablets (40 mg total) by mouth daily for 5 days.   Vitamin D (Ergocalciferol) 1.25 MG (50000 UNIT) Caps capsule Commonly known as: DRISDOL Take 1 capsule (50,000 Units total) by mouth every 7 (seven) days for 8 doses.        Discharge Assessment: Vitals:   11/15/21 0732 11/15/21 1132  BP:    Pulse: 73 92  Resp: 18 18  Temp:    SpO2: 99%    Skin clean, dry and intact without evidence of skin break down, no evidence of skin tears noted. IV catheter discontinued intact. Site without signs and symptoms of complications - no redness or edema noted at insertion site, patient denies c/o pain - only slight tenderness at site.  Dressing with slight pressure applied.  D/c Instructions-Education: Discharge instructions given to patient/family with verbalized understanding. D/c education completed with patient/family including follow up instructions, medication list, d/c activities limitations if  indicated, with other d/c instructions as indicated by MD - patient able to verbalize understanding, all questions fully answered. Patient instructed to return to ED, call 911, or call MD for any changes in condition.  Patient escorted via [place], and D/C home via private auto.  Tsosie Billing, LPN 89/03/1192 17:40 AM

## 2021-11-16 ENCOUNTER — Ambulatory Visit (INDEPENDENT_AMBULATORY_CARE_PROVIDER_SITE_OTHER): Payer: BC Managed Care – PPO | Admitting: Primary Care

## 2021-11-16 ENCOUNTER — Encounter: Payer: Self-pay | Admitting: Primary Care

## 2021-11-16 ENCOUNTER — Other Ambulatory Visit: Payer: Self-pay

## 2021-11-16 ENCOUNTER — Telehealth: Payer: Self-pay

## 2021-11-16 VITALS — BP 138/88 | HR 92 | Temp 98.7°F | Ht 64.0 in | Wt 217.0 lb

## 2021-11-16 DIAGNOSIS — J9620 Acute and chronic respiratory failure, unspecified whether with hypoxia or hypercapnia: Secondary | ICD-10-CM

## 2021-11-16 DIAGNOSIS — J9621 Acute and chronic respiratory failure with hypoxia: Secondary | ICD-10-CM | POA: Diagnosis not present

## 2021-11-16 DIAGNOSIS — J449 Chronic obstructive pulmonary disease, unspecified: Secondary | ICD-10-CM

## 2021-11-16 DIAGNOSIS — J189 Pneumonia, unspecified organism: Secondary | ICD-10-CM

## 2021-11-16 DIAGNOSIS — J441 Chronic obstructive pulmonary disease with (acute) exacerbation: Secondary | ICD-10-CM

## 2021-11-16 MED ORDER — BREZTRI AEROSPHERE 160-9-4.8 MCG/ACT IN AERO: 2.0000 | INHALATION_SPRAY | Freq: Two times a day (BID) | RESPIRATORY_TRACT | 0 refills | Status: DC

## 2021-11-16 MED ORDER — ALPRAZOLAM 0.25 MG PO TABS: 0.2500 mg | ORAL_TABLET | Freq: Two times a day (BID) | ORAL | 0 refills | Status: DC | PRN

## 2021-11-16 MED ORDER — PREDNISONE 10 MG PO TABS: ORAL_TABLET | ORAL | 0 refills | Status: DC

## 2021-11-16 MED ORDER — METHYLPREDNISOLONE ACETATE 80 MG/ML IJ SUSP
80.0000 mg | Freq: Once | INTRAMUSCULAR | Status: AC
  Administered 2021-11-17: 80 mg via INTRAMUSCULAR

## 2021-11-16 MED ORDER — IPRATROPIUM-ALBUTEROL 0.5-2.5 (3) MG/3ML IN SOLN
3.0000 mL | Freq: Four times a day (QID) | RESPIRATORY_TRACT | Status: AC
  Administered 2021-11-17: 3 mL via RESPIRATORY_TRACT

## 2021-11-16 NOTE — Telephone Encounter (Signed)
Transition Care Management Unsuccessful Follow-up Telephone Call  Date of discharge and from where:  11/15/2021 from [facility]   Attempts:  1st Attempt  Reason for unsuccessful TCM follow-up call:  Left voice message

## 2021-11-16 NOTE — Assessment & Plan Note (Addendum)
-   Treated in-patient with Azithromycin and Rocephin. Currently on Augmentin. Starting mucinex-DM '1200mg'$  twice daily and flutter valve. She will need repeat chest imaging in 4-6 weeks

## 2021-11-16 NOTE — Telephone Encounter (Signed)
Transition Care Management Follow-up Telephone Call Date of discharge and from where: 11/15/2021 [name]  How have you been since you were released from the hospital? Patient is having issues with breathing still  Any questions or concerns? Yes  needing order for oxygen  Items Reviewed: Did the pt receive and understand the discharge instructions provided? Yes  Medications obtained and verified? Yes  Other?  Any new allergies since your discharge? No  Dietary orders reviewed? Yes Do you have support at home? No   Home Care and Equipment/Supplies: Were home health services ordered? no If so, what is the name of the agency? N/A  Has the agency set up a time to come to the patient's home? not applicable Were any new equipment or medical supplies ordered?  No What is the name of the medical supply agency? N//A  Were you able to get the supplies/equipment? not applicable Do you have any questions related to the use of the equipment or supplies? No  Functional Questionnaire: (I = Independent and D = Dependent) ADLs:   Bathing/Dressing- I takes little longer   Meal Prep- D  Eating- I   Maintaining continence- I  Transferring/Ambulation- I   Managing Meds- I   Follow up appointments reviewed:  PCP Hospital f/u appt confirmed? Yes  Scheduled to see [name]  on 11/21/2021 @ 12.00p. [facility] f/u appt confirmed? Yes  Scheduled to see Pulmonology on 11/16/2021 . Are transportation arrangements needed? Yes  If their condition worsens, is the pt aware to call PCP or go to the Emergency Dept.? Yes Was the patient provided with contact information for the PCP's office or ED? Yes Was to pt encouraged to call back with questions or concerns? Yes

## 2021-11-16 NOTE — Patient Instructions (Addendum)
Recommendations: - Hold Symbicort  - Start Breztri Aerosphere this evening - take two puffs morning and evening  - Start Mucinex-DM 2 tablets morning and evening  - Start using duoneb nebulizer every 6 hours  - Continue Augmentin and prednisone  - Flutter valve three times a day 5-10 breaths   Orders: - Order for POC re: chronic respiratory failure  - New nebulizer machine re: COPD  - Flutter valve re: COPD   Rx: Duoneb x1 Depo-medrol '80mg'$  IM X1   Follow-up: - 2 weeks with Beth NP or sooner if needed

## 2021-11-16 NOTE — Progress Notes (Signed)
$'@Patient'R$  ID: [name], female    DOB: Oct 28, 1975, 46 y.o.   MRN: [id number]  No chief complaint on file.   Referring provider: Alvester Chou, NP  HPI: 46 year old female, current everyday smoker (25 pack year hx). PMH significant for COPD GOLD II, CAP, acute respiratory failure, hypothyroidism, alcohol dependence, major depression, anemia, tobacco abuse. Patient of Dr. Melvyn Novas, last seen by pulmonary NP on 02/19/20.  Previous [facility] pulmonary encounter: 02/19/2020  46 year old female former smoker followed in our office for COPD.  She is established with Dr. Melvyn Novas.  She was last seen in December/2021.  She is a previous  [facility] patient.  At last visit in December/2021 she was status post hospitalization in September/2021.  Patient is presenting today after completing pulmonary function testing.  Since last being seen patient was seen in urgent care on 01/22/2020 for chronic pain in both shoulders.  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  She reports that she utilizes OSHA mask guidelines.  There is some dust, as well as smoking and heat in the job that she does.  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  She does feel that she works in a fairly open area.  Patient does not have recent CT imaging of her chest.  Prior to working as a Glass blower/designer she is to work in [employer].  Patient is currently in the process of trying to obtain a license.  She had a DUI when she was younger about 2 decades ago.  She is presenting today with the [facility] ignition interlock medical accommodation form to be completed.  03/02/2020 Patient presents today for 1 week follow-up with Spirometry. Maintained on Breztri. Uses oxygen at night. Received flu vaccine last week. She has shortness of breath with activities. She is compliant with Breztri two puffs twice daily. She requires her albuterol rescue in 2-3 times a day. No acute symptoms today. She  tested positive for COVID 2 weeks ago at an [place] care, states that she was treated with Vit C, zithromax and prednisone. She did not require hospitalization and has no residual symptoms. She is looking to obtain her drivers license. She got a DUI over 20 years ago. Reports that she has been sober for 4 years. Needing two spirometry tests to show that she is unable to use interlock system. Denies chest tightness, wheezing or cough.   11/16/2021 - Hospital follow-up Patient presents today for hospital follow-up. History of COPD.  Patient was admitted to the hospital from 11/12/2021 - 11/15/2021 for COPD exacerbation secondary to pneumonia.  She was treated with azithromycin and Rocephin along with IV Solu-Medrol and bronchodilators treatments.  Patient symptoms returned to baseline.  She was discharged on prednisone '40mg'$  x5 days and Augmentin.   She is feeling a little better. She still has productive cough, shortness of breath and wheezing. She is currently on Augmentin and prednisone from the hospital. She is on 4L oxygen. She is using her mother portable tank and would like to get an order for portable concentrator. She needs an new nebulizer machine. She is maintained on Symbicort 37mg. She started smoking again 6 weeks ago. She is out of work currently d/t herniated disc.    Tests:    01/16/2020-CBC with differential-eosinophils relative 12, eosinophils absolute 0.8   01/16/2020-IgE-62   01/16/2020-chest x-ray-no active cardiopulmonary disease   02/19/2020-pulmonary function test-FVC 2.35 (62% predicted), postbronchodilator ratio 67, postbronchodilator FEV1 1.85 (  61% addicted), positive bronchodilator response, TLC 5.5 (105% addicted), DLCO 7.86 (35% predicted)   03/06/2018-alpha-1-143, PI*MM  02/19/2020-pulmonary function test-FVC 2.35 (62% predicted), postbronchodilator ratio 67, postbronchodilator FEV1 1.85 (61% addicted), positive bronchodilator response, TLC 5.5 (105% addicted),  DLCO 7.86 (35% predicted)   No Known Allergies  Immunization History  Administered Date(s) Administered   Influenza Inj Mdck Quad With Preservative 09/26/2021   Influenza,inj,Quad PF,6+ Mos 11/12/2014, 02/13/2016, 12/07/2016, 02/19/2020   Janssen (J&J) SARS-COV-2 Vaccination 07/21/2019    Past Medical History:  Diagnosis Date   Alcohol abuse    Anemia 01/31/2011   Asthma    COPD (chronic obstructive pulmonary disease) ([place])    Depression    Hepatitis C 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 10/24/2021   RNA negative.  Hep C false positive.     Hypoxia 02/01/2011   Medical history non-contributory    Needs sleep apnea assessment 2013.07.31   Tobacco abuse     Tobacco History: Social History   Tobacco Use  Smoking Status Every Day   Packs/day: 0.50   Years: 25.00   Total pack years: 12.50   Types: Cigarettes  Smokeless Tobacco Never  Tobacco Comments   Currently smoking 1/2 pack per day. PAP 11/16/2021   Ready to quit: Not Answered Counseling given: Not Answered Tobacco comments: Currently smoking 1/2 pack per day. PAP 11/16/2021   Outpatient Medications Prior to Visit  Medication Sig Dispense Refill   albuterol (PROVENTIL) (2.5 MG/3ML) 0.083% nebulizer solution Take 3 mLs (2.5 mg total) by nebulization every 6 (six) hours as needed for wheezing or shortness of breath. For asthma 75 mL 1   albuterol (VENTOLIN HFA) 108 (90 Base) MCG/ACT inhaler Inhale 1-2 puffs into the lungs every 6 (six) hours as needed for wheezing or shortness of breath. 6.7 g 0   ALPRAZolam (XANAX) 0.25 MG tablet Take 1 tablet (0.25 mg total) by mouth 2 (two) times daily as needed for anxiety or sleep. 30 tablet 0   amoxicillin-clavulanate (AUGMENTIN) 875-125 MG tablet Take 1 tablet by mouth 2 (two) times daily for 5 days. 10 tablet 0   budesonide-formoterol (SYMBICORT) 80-4.5 MCG/ACT inhaler Inhale 2 puffs into the lungs 2 (two) times daily.     FLUoxetine (PROZAC) 40 MG capsule Take 1 capsule (40 mg total) by  mouth daily. 90 capsule 1   gabapentin (NEURONTIN) 300 MG capsule Take 1 capsule (300 mg total) by mouth 4 (four) times daily. 120 capsule 2   levothyroxine (SYNTHROID) 175 MCG tablet Take 1 tablet (175 mcg total) by mouth daily. 30 tablet 1   meloxicam (MOBIC) 7.5 MG tablet Take 1 tablet (7.5 mg total) by mouth 2 (two) times daily. 180 tablet 0   montelukast (SINGULAIR) 10 MG tablet Take 1 tablet (10 mg total) by mouth at bedtime. 30 tablet 0   pantoprazole (PROTONIX) 40 MG tablet Take 1 tablet (40 mg total) by mouth daily. 90 tablet 3   predniSONE (DELTASONE) 20 MG tablet Take 2 tablets (40 mg total) by mouth daily for 5 days. 10 tablet 0   Vitamin D, Ergocalciferol, (DRISDOL) 1.25 MG (50000 UNIT) CAPS capsule Take 1 capsule (50,000 Units total) by mouth every 7 (seven) days for 8 doses. 8 capsule 0   No facility-administered medications prior to visit.    Review of Systems  Review of Systems  Constitutional: Negative.  Negative for chills, diaphoresis and fever.  HENT:  Positive for congestion.   Respiratory:  Positive for cough, shortness of breath and wheezing.   Cardiovascular: Negative.  Physical Exam  BP 138/88 (BP Location: Right Arm, Patient Position: Sitting, Cuff Size: Normal)   Pulse 92   Temp 98.7 F (37.1 C) (Oral)   Ht '5\' 4"'$  (1.626 m)   Wt 217 lb (98.4 kg)   LMP 10/03/2021 (Approximate)   SpO2 98%   BMI 37.25 kg/m  Physical Exam Constitutional:      Appearance: Normal appearance.  HENT:     Head: Normocephalic and atraumatic.     Mouth/Throat:     Mouth: Mucous membranes are moist.     Pharynx: Oropharynx is clear.  Cardiovascular:     Rate and Rhythm: Normal rate and regular rhythm.  Pulmonary:     Effort: Pulmonary effort is normal.     Breath sounds: Wheezing and rhonchi present.  Musculoskeletal:        General: Normal range of motion.  Skin:    General: Skin is warm and dry.  Neurological:     General: No focal deficit present.     Mental  Status: She is alert and oriented to person, place, and time. Mental status is at baseline.  Psychiatric:        Mood and Affect: Mood normal.        Behavior: Behavior normal.        Thought Content: Thought content normal.        Judgment: Judgment normal.      Lab Results:  CBC    Component Value Date/Time   WBC 8.1 11/15/2021 0457   RBC 3.61 (L) 11/15/2021 0457   HGB 8.7 (L) 11/15/2021 0457   HGB 11.6 (L) 05/13/2013 0449   HCT 30.1 (L) 11/15/2021 0457   HCT 36.2 05/13/2013 0449   PLT 230 11/15/2021 0457   PLT 264 05/13/2013 0449   MCV 83.4 11/15/2021 0457   MCV 82 05/13/2013 0449   MCH 24.1 (L) 11/15/2021 0457   MCHC 28.9 (L) 11/15/2021 0457   RDW 18.0 (H) 11/15/2021 0457   RDW 15.3 (H) 05/13/2013 0449   LYMPHSABS 0.6 (L) 11/12/2021 1408   LYMPHSABS 2.4 05/13/2013 0449   MONOABS 0.3 11/12/2021 1408   MONOABS 0.9 05/13/2013 0449   EOSABS 0.1 11/12/2021 1408   EOSABS 0.0 05/13/2013 0449   BASOSABS 0.0 11/12/2021 1408   BASOSABS 0.0 05/13/2013 0449    BMET    Component Value Date/Time   NA 140 11/15/2021 0457   NA 136 05/11/2013 0341   K 5.0 11/15/2021 0457   K 4.1 05/11/2013 0341   CL 108 11/15/2021 0457   CL 106 05/11/2013 0341   CO2 26 11/15/2021 0457   CO2 27 05/11/2013 0341   GLUCOSE 117 (H) 11/15/2021 0457   GLUCOSE 143 (H) 05/11/2013 0341   BUN 15 11/15/2021 0457   BUN 12 05/11/2013 0341   CREATININE 0.55 11/15/2021 0457   CREATININE 0.71 05/15/2013 0629   CALCIUM 8.4 (L) 11/15/2021 0457   CALCIUM 8.7 05/11/2013 0341   GFRNONAA >60 11/15/2021 0457   GFRNONAA >60 05/15/2013 0629   GFRAA >60 10/25/2019 0739   GFRAA >60 05/15/2013 0629    BNP No results found for: "BNP"  ProBNP    Component Value Date/Time   PROBNP 106.5 01/28/2011 2052    Imaging: CT Angio Chest PE W/Cm &/Or Wo Cm  Result Date: 11/12/2021 CLINICAL DATA:  PE suspected. Repeat scan due to lack of contrast in the pulmonary veins. Shortness of breath since last night.  Patient reports recent pneumonia. EXAM: CT ANGIOGRAPHY CHEST WITH CONTRAST TECHNIQUE:  Multidetector CT imaging of the chest was performed using the standard protocol during bolus administration of intravenous contrast. Multiplanar CT image reconstructions and MIPs were obtained to evaluate the vascular anatomy. RADIATION DOSE REDUCTION: This exam was performed according to the departmental dose-optimization program which includes automated exposure control, adjustment of the mA and/or kV according to patient size and/or use of iterative reconstruction technique. CONTRAST:  125m OMNIPAQUE IOHEXOL 350 MG/ML SOLN COMPARISON:  CT chest 05/27/2007 FINDINGS: Cardiovascular: Satisfactory opacification of the pulmonary arteries to the segmental level. No evidence of pulmonary embolism. Normal heart size. No pericardial effusion. The main pulmonary artery is dilated measuring up to 4 cm in diameter. Mediastinum/Nodes: No enlarged mediastinal, hilar, or axillary lymph nodes. Thyroid gland, trachea, and esophagus demonstrate no significant findings. Lungs/Pleura: Patchy airspace opacity is noted in the superior segment of the right lower lobe (series 9, image 72). Additional smaller patchy airspace opacities noted in the medial right lower lobe as well as the medial left lower lobe (series 9, image 74, 76). Linear subsegmental atelectasis noted in the medial left upper lobe, lower right upper lobe anteriorly, lateral aspect of the right middle lobe, as well as the medial left lung base, similar to 05/27/2007. Two 0.5 cm ground-glass opacities are noted in the medial right upper lobe (series 9, images 28, 38). No pleural effusion or pneumothorax. Upper Abdomen: No acute abnormality. Musculoskeletal: No chest wall abnormality. No acute or significant osseous findings. Review of the MIP images confirms the above findings. IMPRESSION: 1. No evidence of pulmonary embolism. 2. Multifocal patchy airspace opacities, most prominent in  the superior segment of the right lower lobe, consistent with multifocal pneumonia. 3. Two 0.5 cm ground-glass opacities in the medial right upper lobe, likely infectious/inflammatory given the multifocal opacities described above. 4. Dilated main pulmonary artery measuring up to 4 cm in diameter, suggestive of underlying pulmonary arterial hypertension. 5. Scattered areas of linear subsegmental atelectasis, similar to 05/27/2007. Electronically Signed   By: LIleana RoupM.D.   On: 11/12/2021 16:18   DG Chest Port 1 View  Result Date: 11/12/2021 CLINICAL DATA:  Shortness of breath, productive cough EXAM: PORTABLE CHEST 1 VIEW COMPARISON:  10/24/2021 FINDINGS: The heart size and mediastinal contours are within normal limits. Improved, although persistent scattered heterogeneous opacity of the lung bases. The visualized skeletal structures are unremarkable. IMPRESSION: Improved, although persistent scattered heterogeneous opacity of the lung bases, suggesting improved infection or aspiration. No new airspace opacity. Electronically Signed   By: ADelanna AhmadiM.D.   On: 11/12/2021 15:16   DG Ankle Complete Left  Result Date: 10/24/2021 CLINICAL DATA:  Left ankle pain and swelling EXAM: LEFT ANKLE COMPLETE - 3+ VIEW COMPARISON:  None Available. FINDINGS: Normal alignment without acute osseous finding or fracture. Distal tibia, distal fibula, talus and calcaneus appear intact. Moderate degenerative arthropathy of the talonavicular joint with joint space loss, sclerosis and dorsal osteophytes. This is best demonstrated on the lateral view. IMPRESSION: No acute abnormality. Talonavicular degenerative arthropathy. Electronically Signed   By: MJerilynn Mages  Shick [name]   On: 10/24/2021 20:28   DG Chest 2 View  Result Date: 10/24/2021 CLINICAL DATA:  Wheezing, history of COPD EXAM: CHEST - 2 VIEW COMPARISON:  07/27/2021 FINDINGS: Stable heart size and vascularity. Similar chronic peribronchial thickening and basilar  interstitial changes. Slight increased right basilar opacity may be related to increased atelectasis but difficult to exclude basilar pneumonia. Negative for edema, effusion or pneumothorax. Trachea midline. No acute osseous finding. IMPRESSION: Chronic central bronchitic changes  and basilar interstitial prominence. New right basilar streaky opacity compatible with atelectasis versus early basilar pneumonia. Electronically Signed   By: Jerilynn Mages.  Shick [name]   On: 10/24/2021 20:27     Assessment & Plan:   COPD exacerbation ([place]) - Recently admitted to the hospital for COPD exacerbation for 4 days.  She is some better but continues to have persistent productive cough with associated shortness of breath and wheezing.  On exam she had scattered rhonchi and wheezing.  She received ipratropium albuterol nebulizer in office along with Depo-Medrol 80 mg IM x1.  Continue Augmentin and extending prednisone course additional 10 days. We will have her start Breztri Aerosphere x 2 weeks (in place of Symbicort 59mg). FU in 2 weeks.   Acute on chronic respiratory failure ([place] - Qualifying patient for POC today - Continue to use 4L supplemental oxygen with exertion to maintain O2 >88-90% and at bedtime   Pneumonia due to infectious organism - Treated in-patient with Azithromycin and Rocephin. Currently on Augmentin. She will need repeat chest imaging in 4-6 weeks   EMartyn Ehrich NP 11/16/2021

## 2021-11-16 NOTE — Assessment & Plan Note (Addendum)
-   Recently admitted to the hospital for COPD exacerbation/pneumonia for 4 days.  She is some better but continues to have persistent productive cough with associated shortness of breath and wheezing.  On exam she had scattered rhonchi and wheezing.  She received ipratropium albuterol nebulizer in office along with Depo-Medrol 80 mg IM x1.  Continue Augmentin and extending prednisone course additional 10 days. We will have her start Breztri Aerosphere x 2 weeks (in place of Symbicort 40mg). FU in 2 weeks.

## 2021-11-16 NOTE — Assessment & Plan Note (Signed)
-   Qualifying patient for POC today - Continue to use 4L supplemental oxygen with exertion to maintain O2 >88-90% and at bedtime

## 2021-11-17 DIAGNOSIS — J9621 Acute and chronic respiratory failure with hypoxia: Secondary | ICD-10-CM | POA: Diagnosis not present

## 2021-11-17 DIAGNOSIS — J449 Chronic obstructive pulmonary disease, unspecified: Secondary | ICD-10-CM | POA: Diagnosis not present

## 2021-11-17 NOTE — Telephone Encounter (Signed)
Pt has an appointment with pulmonary on 12/02/2021

## 2021-11-17 NOTE — Telephone Encounter (Signed)
I do not order oxygen.  Does pt have pulmonary appt pending or set up?

## 2021-11-18 NOTE — Telephone Encounter (Signed)
Pt already seen 10/4 with pulmonary.

## 2021-11-21 ENCOUNTER — Other Ambulatory Visit: Payer: Self-pay

## 2021-11-22 ENCOUNTER — Ambulatory Visit (INDEPENDENT_AMBULATORY_CARE_PROVIDER_SITE_OTHER): Payer: BC Managed Care – PPO | Admitting: Family

## 2021-11-22 VITALS — BP 110/72 | HR 94 | Temp 98.4°F | Resp 16 | Ht 64.0 in | Wt 213.1 lb

## 2021-11-22 DIAGNOSIS — J9601 Acute respiratory failure with hypoxia: Secondary | ICD-10-CM | POA: Diagnosis not present

## 2021-11-22 DIAGNOSIS — R7989 Other specified abnormal findings of blood chemistry: Secondary | ICD-10-CM | POA: Diagnosis not present

## 2021-11-22 DIAGNOSIS — R739 Hyperglycemia, unspecified: Secondary | ICD-10-CM

## 2021-11-22 DIAGNOSIS — R197 Diarrhea, unspecified: Secondary | ICD-10-CM | POA: Diagnosis not present

## 2021-11-22 DIAGNOSIS — E039 Hypothyroidism, unspecified: Secondary | ICD-10-CM

## 2021-11-22 DIAGNOSIS — J209 Acute bronchitis, unspecified: Secondary | ICD-10-CM

## 2021-11-22 DIAGNOSIS — D649 Anemia, unspecified: Secondary | ICD-10-CM | POA: Diagnosis not present

## 2021-11-22 DIAGNOSIS — J9621 Acute and chronic respiratory failure with hypoxia: Secondary | ICD-10-CM

## 2021-11-22 DIAGNOSIS — Z87891 Personal history of nicotine dependence: Secondary | ICD-10-CM | POA: Insufficient documentation

## 2021-11-22 LAB — CBC WITH DIFFERENTIAL/PLATELET
Basophils Absolute: 0 10*3/uL (ref 0.0–0.1)
Basophils Relative: 0.7 % (ref 0.0–3.0)
Eosinophils Absolute: 0 10*3/uL (ref 0.0–0.7)
Eosinophils Relative: 0.5 % (ref 0.0–5.0)
HCT: 34.1 % — ABNORMAL LOW (ref 36.0–46.0)
Hemoglobin: 10.8 g/dL — ABNORMAL LOW (ref 12.0–15.0)
Lymphocytes Relative: 23.7 % (ref 12.0–46.0)
Lymphs Abs: 1.6 10*3/uL (ref 0.7–4.0)
MCHC: 31.5 g/dL (ref 30.0–36.0)
MCV: 75.9 fl — ABNORMAL LOW (ref 78.0–100.0)
Monocytes Absolute: 0.3 10*3/uL (ref 0.1–1.0)
Monocytes Relative: 4.6 % (ref 3.0–12.0)
Neutro Abs: 4.8 10*3/uL (ref 1.4–7.7)
Neutrophils Relative %: 70.5 % (ref 43.0–77.0)
Platelets: 393 10*3/uL (ref 150.0–400.0)
RBC: 4.5 Mil/uL (ref 3.87–5.11)
RDW: 18.7 % — ABNORMAL HIGH (ref 11.5–15.5)
WBC: 6.9 10*3/uL (ref 4.0–10.5)

## 2021-11-22 LAB — COMPREHENSIVE METABOLIC PANEL
ALT: 14 U/L (ref 0–35)
AST: 12 U/L (ref 0–37)
Albumin: 4.4 g/dL (ref 3.5–5.2)
Alkaline Phosphatase: 60 U/L (ref 39–117)
BUN: 16 mg/dL (ref 6–23)
CO2: 29 mEq/L (ref 19–32)
Calcium: 9.8 mg/dL (ref 8.4–10.5)
Chloride: 100 mEq/L (ref 96–112)
Creatinine, Ser: 0.7 mg/dL (ref 0.40–1.20)
GFR: 103.11 mL/min (ref >60.00)
Glucose, Bld: 105 mg/dL — ABNORMAL HIGH (ref 70–99)
Potassium: 4.5 mEq/L (ref 3.5–5.1)
Sodium: 137 mEq/L (ref 135–145)
Total Bilirubin: 0.5 mg/dL (ref 0.2–1.2)
Total Protein: 7.4 g/dL (ref 6.0–8.3)

## 2021-11-22 LAB — HEMOGLOBIN A1C: Hgb A1c MFr Bld: 5.5 % (ref 4.6–6.5)

## 2021-11-22 MED ORDER — ALBUTEROL SULFATE HFA 108 (90 BASE) MCG/ACT IN AERS: 1.0000 | INHALATION_SPRAY | Freq: Four times a day (QID) | RESPIRATORY_TRACT | 0 refills | Status: DC | PRN

## 2021-11-22 NOTE — Patient Instructions (Addendum)
  Stop by the lab prior to leaving today. I will notify you of your results once received.   Recommend daily probiotic   Regards,   Eugenia Pancoast FNP-C

## 2021-11-22 NOTE — Assessment & Plan Note (Signed)
Continue lexothyroxine 175 mcg once daily Check tsh pending results

## 2021-11-22 NOTE — Assessment & Plan Note (Signed)
Cont inhalers per pulmonary f/u as scheduled

## 2021-11-22 NOTE — Assessment & Plan Note (Signed)
Trending down fobt today pending results Repeat cbc

## 2021-11-22 NOTE — Progress Notes (Signed)
Established Patient Office Visit  Subjective:  Patient ID: [name], female    DOB: 15-May-1975  Age: 46 y.o. MRN: [id number]  CC:  Chief Complaint  Patient presents with   Hospitalization Follow-up    HPI [name] is here for hospital follow up.   ER 11/12/21, c/o sob. Troponin initial <2, ekg nonspecific ST changes. No electrolyte abn,  Cxr: scattered heterogenous opacities in the lung base but with noted improvement from prior. 2.5 cm ground glass opacities in medial right upper lobe, dilated main pulmonary artery 4 cm.   Dx with copd exacerbation with hypoxia as well as ongoing multifocal pneumonia  Was started on iv zpack and rocephin.   CTA 11/12/21: dilated main pulm artery 4 cm suggestive of underlying pulmonary arterial htn.   Visit with pulmonary Geraldo Pitter 10/4, followed for copd. Was given nebulizer in office with depo medrol 80 which helped her. Was told to continue augmentin and prednisone course was extended for ten more days. Started on bretztri aerosphere x two weeks, and was told to f/u in two weeks.she has f/u scheduled.  Will need repeat cxr in about four weeks.  She does note cough is becoming more productive, but at night time the cough is more difficult.   Pt does report quit smoking two weeks ago.   Last last seven days ago does show worsening anemia. Pt denies known blood in stool. Does note a lot of fatigue and doe. Feels like 'I'm falling apart'.  Lab Results  Component Value Date   WBC 8.1 11/15/2021   HGB 8.7 (L) 11/15/2021   HCT 30.1 (L) 11/15/2021   MCV 83.4 11/15/2021   PLT 230 11/15/2021    Calcium also a bit low.  Last metabolic panel Lab Results  Component Value Date   GLUCOSE 117 (H) 11/15/2021   NA 140 11/15/2021   K 5.0 11/15/2021   CL 108 11/15/2021   CO2 26 11/15/2021   BUN 15 11/15/2021   CREATININE 0.55 11/15/2021   GFRNONAA >60 11/15/2021   CALCIUM 8.4 (L) 11/15/2021   PHOS 3.8 10/24/2019   PROT  8.1 11/12/2021   ALBUMIN 4.3 11/12/2021   BILITOT 0.6 11/12/2021   ALKPHOS 66 11/12/2021   AST 20 11/12/2021   ALT 17 11/12/2021   ANIONGAP 6 11/15/2021   Hypothyroid: due to repeat. Taking 175 mcg of levothyroxine. Was previously 150 mg.  Lab Results  Component Value Date   TSH 6.11 (H) 10/24/2021   Vitamin d def: still taking RX dosing.   Anemia: does report diarrhea 3-5 times daily. Taking pepto without much relief.    Past Medical History:  Diagnosis Date   Alcohol abuse    Anemia 01/31/2011   Asthma    COPD (chronic obstructive pulmonary disease) (HCC)    Depression    Hepatitis C 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 10/24/2021   RNA negative.  Hep C false positive.     Hypoxia 02/01/2011   Medical history non-contributory    Needs sleep apnea assessment 2013.07.31   Tobacco abuse     Past Surgical History:  Procedure Laterality Date   ANKLE GANGLION CYST EXCISION     BACK SURGERY     CESAREAN SECTION     COLONOSCOPY WITH PROPOFOL N/A 06/27/2016   Procedure: COLONOSCOPY WITH PROPOFOL;  Surgeon: Jonathon Bellows, MD;  Location[facility] ENDOSCOPY;  Service: Endoscopy;  Laterality: N/A;   DILATION AND CURETTAGE OF UTERUS     ESOPHAGOGASTRODUODENOSCOPY (EGD) WITH PROPOFOL N/A 06/27/2016  Procedure: ESOPHAGOGASTRODUODENOSCOPY (EGD) WITH PROPOFOL;  Surgeon: Jonathon Bellows, MD;  Location: [facility] ENDOSCOPY;  Service: Endoscopy;  Laterality: N/A;    Family History  Problem Relation Age of Onset   Stroke Mother    Hypothyroidism Mother    Coronary artery disease Mother    Throat cancer Mother    Stroke Maternal Grandmother    Diabetes Maternal Grandmother    COPD Paternal Grandmother     Social History   Socioeconomic History   Marital status: Single    Spouse name: Not on file   Number of children: 1   Years of education: Not on file   Highest education level: Not on file  Occupational History    Employer: [employer]  Tobacco Use   Smoking status: Every Day    Packs/day:  0.50    Years: 25.00    Total pack years: 12.50    Types: Cigarettes   Smokeless tobacco: Never   Tobacco comments:    Currently smoking 1/2 pack per day. PAP 11/16/2021  Vaping Use   Vaping Use: Former  Substance and Sexual Activity   Alcohol use: Yes   Drug use: Not Currently    Types: Marijuana, Cocaine    Comment: last >1 y/o   Sexual activity: Not Currently    Partners: Female    Comment: Homosexual  Other Topics Concern   Not on file  Social History Narrative   Son, 48 y/o    Social Determinants of Health   Financial Resource Strain: Not on file  Food Insecurity: No Food Insecurity (11/12/2021)   Hunger Vital Sign    Worried About Running Out of Food in the Last Year: Never true    Ran Out of Food in the Last Year: Never true  Transportation Needs: No Transportation Needs (11/12/2021)   PRAPARE - Hydrologist (Medical): No    Lack of Transportation (Non-Medical): No  Physical Activity: Not on file  Stress: Not on file  Social Connections: Not on file  Intimate Partner Violence: Not At Risk (11/12/2021)   Humiliation, Afraid, Rape, and Kick questionnaire    Fear of Current or Ex-Partner: No    Emotionally Abused: No    Physically Abused: No    Sexually Abused: No    Outpatient Medications Prior to Visit  Medication Sig Dispense Refill   albuterol (PROVENTIL) (2.5 MG/3ML) 0.083% nebulizer solution Take 3 mLs (2.5 mg total) by nebulization every 6 (six) hours as needed for wheezing or shortness of breath. For asthma 75 mL 1   ALPRAZolam (XANAX) 0.25 MG tablet Take 1 tablet (0.25 mg total) by mouth 2 (two) times daily as needed for anxiety or sleep. 30 tablet 0   Budeson-Glycopyrrol-Formoterol (BREZTRI AEROSPHERE) 160-9-4.8 MCG/ACT AERO Inhale 2 puffs into the lungs in the morning and at bedtime. 5.9 g 0   budesonide-formoterol (SYMBICORT) 80-4.5 MCG/ACT inhaler Inhale 2 puffs into the lungs 2 (two) times daily.     FLUoxetine (PROZAC) 40  MG capsule Take 1 capsule (40 mg total) by mouth daily. 90 capsule 1   gabapentin (NEURONTIN) 300 MG capsule Take 1 capsule (300 mg total) by mouth 4 (four) times daily. 120 capsule 2   levothyroxine (SYNTHROID) 175 MCG tablet Take 1 tablet (175 mcg total) by mouth daily. 30 tablet 1   meloxicam (MOBIC) 7.5 MG tablet Take 1 tablet (7.5 mg total) by mouth 2 (two) times daily. 180 tablet 0   montelukast (SINGULAIR) 10 MG tablet Take 1  tablet (10 mg total) by mouth at bedtime. 30 tablet 0   pantoprazole (PROTONIX) 40 MG tablet Take 1 tablet (40 mg total) by mouth daily. 90 tablet 3   predniSONE (DELTASONE) 10 MG tablet Take 2 tablets x 5 days; then 1 tablet x 5 days; then stop (start on 11/21/21) 15 tablet 0   Vitamin D, Ergocalciferol, (DRISDOL) 1.25 MG (50000 UNIT) CAPS capsule Take 1 capsule (50,000 Units total) by mouth every 7 (seven) days for 8 doses. 8 capsule 0   albuterol (VENTOLIN HFA) 108 (90 Base) MCG/ACT inhaler Inhale 1-2 puffs into the lungs every 6 (six) hours as needed for wheezing or shortness of breath. 6.7 g 0   Facility-Administered Medications Prior to Visit  Medication Dose Route Frequency Provider Last Rate Last Admin   ipratropium-albuterol (DUONEB) 0.5-2.5 (3) MG/3ML nebulizer solution 3 mL  3 mL Nebulization Q6H Martyn Ehrich, NP   3 mL at 11/17/21 1357    No Known Allergies      Objective:    Physical Exam Constitutional:      General: She is not in acute distress.    Appearance: Normal appearance. She is obese. She is not ill-appearing, toxic-appearing or diaphoretic.  HENT:     Right Ear: Tympanic membrane normal.     Left Ear: Tympanic membrane normal.     Nose: Nose normal. No congestion or rhinorrhea.     Right Turbinates: Not enlarged or swollen.     Left Turbinates: Not enlarged or swollen.     Right Sinus: No maxillary sinus tenderness or frontal sinus tenderness.     Left Sinus: No maxillary sinus tenderness or frontal sinus tenderness.      Mouth/Throat:     Mouth: Mucous membranes are moist.     Pharynx: No pharyngeal swelling, oropharyngeal exudate or posterior oropharyngeal erythema.     Tonsils: No tonsillar exudate.  Eyes:     Extraocular Movements: Extraocular movements intact.     Conjunctiva/sclera: Conjunctivae normal.     Pupils: Pupils are equal, round, and reactive to light.  Neck:     Thyroid: No thyroid mass.  Cardiovascular:     Rate and Rhythm: Normal rate and regular rhythm.  Pulmonary:     Effort: Pulmonary effort is normal. No accessory muscle usage or respiratory distress.     Breath sounds: Stridor and decreased air movement present. Examination of the right-upper field reveals decreased breath sounds and wheezing. Examination of the left-upper field reveals decreased breath sounds and wheezing. Examination of the right-middle field reveals decreased breath sounds and wheezing. Examination of the left-middle field reveals decreased breath sounds and wheezing. Examination of the right-lower field reveals decreased breath sounds and wheezing. Examination of the left-lower field reveals decreased breath sounds and wheezing. Decreased breath sounds and wheezing present.  Abdominal:     Tenderness: There is no abdominal tenderness.  Lymphadenopathy:     Cervical:     Right cervical: No superficial cervical adenopathy.    Left cervical: No superficial cervical adenopathy.  Neurological:     Mental Status: She is alert.       BP 110/72   Pulse 94   Temp 98.4 F (36.9 C)   Resp 16   Ht '5\' 4"'$  (1.626 m)   Wt 213 lb 2 oz (96.7 kg)   LMP 11/15/2021 (Approximate)   SpO2 94%   BMI 36.58 kg/m  Wt Readings from Last 3 Encounters:  11/22/21 213 lb 2 oz (96.7 kg)  11/16/21  217 lb (98.4 kg)  11/12/21 209 lb 1.6 oz (94.8 kg)     Health Maintenance Due  Topic Date Due   TETANUS/TDAP  Never done   PAP SMEAR-Modifier  Never done   COVID-19 Vaccine (2 - Booster for Janssen series) 09/15/2019    There  are no preventive care reminders to display for this patient.  Lab Results  Component Value Date   TSH 6.11 (H) 10/24/2021   Lab Results  Component Value Date   WBC 8.1 11/15/2021   HGB 8.7 (L) 11/15/2021   HCT 30.1 (L) 11/15/2021   MCV 83.4 11/15/2021   PLT 230 11/15/2021   Lab Results  Component Value Date   NA 140 11/15/2021   K 5.0 11/15/2021   CO2 26 11/15/2021   GLUCOSE 117 (H) 11/15/2021   BUN 15 11/15/2021   CREATININE 0.55 11/15/2021   BILITOT 0.6 11/12/2021   ALKPHOS 66 11/12/2021   AST 20 11/12/2021   ALT 17 11/12/2021   PROT 8.1 11/12/2021   ALBUMIN 4.3 11/12/2021   CALCIUM 8.4 (L) 11/15/2021   ANIONGAP 6 11/15/2021   GFR 90.60 10/24/2021   Lab Results  Component Value Date   CHOL 190 10/24/2021   Lab Results  Component Value Date   HDL 67.60 10/24/2021   Lab Results  Component Value Date   LDLCALC 103 (H) 10/24/2021   Lab Results  Component Value Date   TRIG 98.0 10/24/2021   Lab Results  Component Value Date   CHOLHDL 3 10/24/2021   Lab Results  Component Value Date   HGBA1C 5.5 10/24/2021      Assessment & Plan:   Problem List Items Addressed This Visit       Respiratory   Acute on chronic respiratory failure with hypoxia (HCC)    Cont inhalers per pulmonary f/u as scheduled         Digestive   Diarrhea of presumed infectious origin    Stool cultures ordered pending results Focus on bland diet  Recommend probiotic for multiple antbx use lately  R/o cdiff with culture      Relevant Orders   Giardia antigen   C. difficile GDH and Toxin A/B   Gastrointestinal Pathogen Pnl RT, PCR   Fecal occult blood, imunochemical   IBC + Ferritin     Endocrine   Hypothyroidism    Continue lexothyroxine 175 mcg once daily Check tsh pending results      Relevant Orders   TSH   CBC with Differential   Thyroid Peroxidase Antibodies (TPO) (REFL)   T3, free   T4, free     Other   Anemia (Chronic)    Trending down fobt today  pending results Repeat cbc       Relevant Orders   Giardia antigen   C. difficile GDH and Toxin A/B   Gastrointestinal Pathogen Pnl RT, PCR   Fecal occult blood, imunochemical   IBC + Ferritin   Hyperglycemia    Ordering a1c pending results Pt advised of the following: Work on a diabetic diet, try to incorporate exercise at least 20-30 a day for 3 days a week or more.        Relevant Orders   Hemoglobin A1c   Cessation of tobacco use in previous 12 months    Quit two weeks ago. Pt still working on complete cessation.       Hypocalcemia    Repeat cmp pending results      Relevant Orders  Comprehensive metabolic panel   Other Visit Diagnoses     Elevated serum creatinine    -  Primary   Relevant Orders   Comprehensive metabolic panel   Acute bronchitis, unspecified organism       Relevant Medications   albuterol (VENTOLIN HFA) 108 (90 Base) MCG/ACT inhaler   Other Relevant Orders   CBC with Differential       Meds ordered this encounter  Medications   DISCONTD: albuterol (VENTOLIN HFA) 108 (90 Base) MCG/ACT inhaler    Sig: Inhale 1-2 puffs into the lungs every 6 (six) hours as needed for wheezing or shortness of breath.    Dispense:  6.7 g    Refill:  0   albuterol (VENTOLIN HFA) 108 (90 Base) MCG/ACT inhaler    Sig: Inhale 1-2 puffs into the lungs every 6 (six) hours as needed for wheezing or shortness of breath.    Dispense:  6.7 g    Refill:  0    Order Specific Question:   Supervising Provider    Answer:   BEDSOLE, AMY E [[id number]]    Follow-up: No follow-ups on file.    Kari Pancoast, FNP

## 2021-11-22 NOTE — Assessment & Plan Note (Signed)
Ordering a1c pending results Pt advised of the following: Work on a diabetic diet, try to incorporate exercise at least 20-30 a day for 3 days a week or more.

## 2021-11-22 NOTE — Assessment & Plan Note (Signed)
Repeat cmp pending results 

## 2021-11-22 NOTE — Assessment & Plan Note (Signed)
Stool cultures ordered pending results Focus on bland diet  Recommend probiotic for multiple antbx use lately  R/o cdiff with culture

## 2021-11-22 NOTE — Assessment & Plan Note (Signed)
Quit two weeks ago. Pt still working on complete cessation.

## 2021-11-23 ENCOUNTER — Other Ambulatory Visit: Payer: Self-pay | Admitting: Family

## 2021-11-23 DIAGNOSIS — D649 Anemia, unspecified: Secondary | ICD-10-CM

## 2021-11-23 DIAGNOSIS — E039 Hypothyroidism, unspecified: Secondary | ICD-10-CM

## 2021-11-23 LAB — IBC + FERRITIN
Ferritin: 4.7 ng/mL — ABNORMAL LOW (ref 10.0–291.0)
Iron: 32 ug/dL — ABNORMAL LOW (ref 42–145)
Saturation Ratios: 6.1 % — ABNORMAL LOW (ref 20.0–50.0)
TIBC: 523.6 ug/dL — ABNORMAL HIGH (ref 250.0–450.0)
Transferrin: 374 mg/dL — ABNORMAL HIGH (ref 212.0–360.0)

## 2021-11-23 LAB — T4, FREE: Free T4: 1.73 ng/dL — ABNORMAL HIGH (ref 0.60–1.60)

## 2021-11-23 LAB — TSH: TSH: 1.99 u[IU]/mL (ref 0.35–5.50)

## 2021-11-23 LAB — THYROID PEROXIDASE ANTIBODIES (TPO) (REFL): Thyroperoxidase Ab SerPl-aCnc: >900 IU/mL — ABNORMAL HIGH (ref <9)

## 2021-11-23 LAB — T3, FREE: T3, Free: 6.1 pg/mL — ABNORMAL HIGH (ref 2.3–4.2)

## 2021-11-23 MED ORDER — LEVOTHYROXINE SODIUM 175 MCG PO TABS: ORAL_TABLET | ORAL | 1 refills | Status: DC

## 2021-11-23 MED ORDER — LEVOTHYROXINE SODIUM 150 MCG PO TABS: ORAL_TABLET | ORAL | 3 refills | Status: DC

## 2021-11-25 ENCOUNTER — Encounter: Payer: Self-pay | Admitting: Gastroenterology

## 2021-11-28 ENCOUNTER — Ambulatory Visit
Admission: RE | Admit: 2021-11-28 | Payer: BC Managed Care – PPO | Source: Home / Self Care | Admitting: Gastroenterology

## 2021-11-28 ENCOUNTER — Encounter: Admission: RE | Payer: Self-pay | Source: Home / Self Care

## 2021-11-28 SURGERY — COLONOSCOPY WITH PROPOFOL
Anesthesia: General

## 2021-11-29 DIAGNOSIS — M5136 Other intervertebral disc degeneration, lumbar region: Secondary | ICD-10-CM | POA: Diagnosis not present

## 2021-11-30 DIAGNOSIS — J9621 Acute and chronic respiratory failure with hypoxia: Secondary | ICD-10-CM | POA: Diagnosis not present

## 2021-11-30 DIAGNOSIS — J449 Chronic obstructive pulmonary disease, unspecified: Secondary | ICD-10-CM | POA: Diagnosis not present

## 2021-11-30 DIAGNOSIS — J441 Chronic obstructive pulmonary disease with (acute) exacerbation: Secondary | ICD-10-CM | POA: Diagnosis not present

## 2021-12-01 ENCOUNTER — Other Ambulatory Visit: Payer: Self-pay

## 2021-12-01 DIAGNOSIS — E039 Hypothyroidism, unspecified: Secondary | ICD-10-CM

## 2021-12-01 MED ORDER — LEVOTHYROXINE SODIUM 150 MCG PO TABS: ORAL_TABLET | ORAL | 3 refills | Status: DC

## 2021-12-02 ENCOUNTER — Ambulatory Visit (INDEPENDENT_AMBULATORY_CARE_PROVIDER_SITE_OTHER): Payer: BC Managed Care – PPO | Admitting: Primary Care

## 2021-12-02 ENCOUNTER — Encounter: Payer: Self-pay | Admitting: Primary Care

## 2021-12-02 ENCOUNTER — Ambulatory Visit (INDEPENDENT_AMBULATORY_CARE_PROVIDER_SITE_OTHER): Payer: BC Managed Care – PPO

## 2021-12-02 VITALS — BP 120/66 | HR 83 | Ht 64.0 in | Wt 210.6 lb

## 2021-12-02 DIAGNOSIS — Z720 Tobacco use: Secondary | ICD-10-CM | POA: Diagnosis not present

## 2021-12-02 DIAGNOSIS — J189 Pneumonia, unspecified organism: Secondary | ICD-10-CM | POA: Diagnosis not present

## 2021-12-02 DIAGNOSIS — J441 Chronic obstructive pulmonary disease with (acute) exacerbation: Secondary | ICD-10-CM

## 2021-12-02 DIAGNOSIS — J449 Chronic obstructive pulmonary disease, unspecified: Secondary | ICD-10-CM

## 2021-12-02 MED ORDER — SPIRIVA RESPIMAT 2.5 MCG/ACT IN AERS: 2.0000 | INHALATION_SPRAY | Freq: Every day | RESPIRATORY_TRACT | 1 refills | Status: DC

## 2021-12-02 MED ORDER — SPIRIVA RESPIMAT 2.5 MCG/ACT IN AERS: 2.0000 | INHALATION_SPRAY | Freq: Every day | RESPIRATORY_TRACT | 5 refills | Status: DC

## 2021-12-02 NOTE — Progress Notes (Signed)
Please let patient know CXR showed similar appearing opacities/ residual pneumonia. It can take 4-8 weeks to clear on imaging. Please have patient repeat CXR in 4 weeks re: pneumonia, if not improved we will get CT chest to re-evaluate

## 2021-12-02 NOTE — Assessment & Plan Note (Signed)
Resolved

## 2021-12-02 NOTE — Progress Notes (Unsigned)
$'@Patient'Z$  ID: [name], female    DOB: 02-13-1976, 46 y.o.   MRN: [id number]  No chief complaint on file.   Referring provider: Eugenia Pancoast, FNP  HPI: 46 year old female, current everyday smoker (25 pack year hx). PMH significant for COPD GOLD II, CAP, acute respiratory failure, hypothyroidism, alcohol dependence, major depression, anemia, tobacco abuse. Patient of Dr. Melvyn Novas, last seen by pulmonary NP on 02/19/20.  Previous [facility] pulmonary encounter: 02/19/2020  46 year old female former smoker followed in our office for COPD.  She is established with Dr. Melvyn Novas.  She was last seen in December/2021.  She is a previous [facility] patient.  At last visit in December/2021 she was status post hospitalization in September/2021.  Patient is presenting today after completing pulmonary function testing.  Since last being seen patient was seen in urgent care on 01/22/2020 for chronic pain in both shoulders.  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  She reports that she utilizes OSHA mask guidelines.  There is some dust, as well as smoking and heat in the job that she does.  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  She does feel that she works in a fairly open area.  Patient does not have recent CT imaging of her chest.  Prior to working as a Glass blower/designer she is to work in [employer].  Patient is currently in the process of trying to obtain a license.  She had a DUI when she was younger about 2 decades ago.  She is presenting today with the [facility] ignition interlock medical accommodation form to be completed.  03/02/2020 Patient presents today for 1 week follow-up with Spirometry. Maintained on Breztri. Uses oxygen at night. Received flu vaccine last week. She has shortness of breath with activities. She is compliant with Breztri two puffs twice daily. She requires her albuterol rescue in 2-3 times a day. No acute symptoms today.  She tested positive for COVID 2 weeks ago at an [facility] care, states that she was treated with Vit C, zithromax and prednisone. She did not require hospitalization and has no residual symptoms. She is looking to obtain her drivers license. She got a DUI over 20 years ago. Reports that she has been sober for 4 years. Needing two spirometry tests to show that she is unable to use interlock system. Denies chest tightness, wheezing or cough.   11/16/2021  Patient presents today for hospital follow-up. History of COPD.  Patient was admitted to the hospital from 11/12/2021 - 11/15/2021 for COPD exacerbation secondary to pneumonia.  She was treated with azithromycin and Rocephin along with IV Solu-Medrol and bronchodilators treatments.  Patient symptoms returned to baseline.  She was discharged on prednisone '40mg'$  x5 days and Augmentin.   She is feeling a little better. She still has productive cough, shortness of breath and wheezing. She is currently on Augmentin and prednisone from the hospital. She is on 4L oxygen. She is using her mother portable tank and would like to get an order for portable concentrator. She needs an new nebulizer machine. She is maintained on Symbicort 80mg. She started smoking again 6 weeks ago. She is out of work currently d/t herniated disc.   12/02/2021- Interim hx  Patient presents today for 2 week follow-up.     Cough and sob are better Just has resdiual fatigue            No Known Allergies  Immunization History  Administered Date(s) Administered   Influenza Inj Mdck Quad With Preservative 09/26/2021   Influenza,inj,Quad PF,6+ Mos 11/12/2014, 02/13/2016, 12/07/2016, 02/19/2020   Janssen (J&J) SARS-COV-2 Vaccination 07/21/2019    Past Medical History:  Diagnosis Date   Alcohol abuse    Anemia 01/31/2011   Asthma    COPD (chronic obstructive pulmonary disease) (HCC)    Depression    Hepatitis C 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 10/24/2021   RNA  negative.  Hep C false positive.     Hypoxia 02/01/2011   Medical history non-contributory    Needs sleep apnea assessment 2013.07.31   Tobacco abuse     Tobacco History: Social History   Tobacco Use  Smoking Status Every Day   Packs/day: 0.50   Years: 25.00   Total pack years: 12.50   Types: Cigarettes  Smokeless Tobacco Never  Tobacco Comments   Currently smoking 1/2 pack per day. PAP 11/16/2021   Ready to quit: Not Answered Counseling given: Not Answered Tobacco comments: Currently smoking 1/2 pack per day. PAP 11/16/2021   Outpatient Medications Prior to Visit  Medication Sig Dispense Refill   albuterol (PROVENTIL) (2.5 MG/3ML) 0.083% nebulizer solution Take 3 mLs (2.5 mg total) by nebulization every 6 (six) hours as needed for wheezing or shortness of breath. For asthma 75 mL 1   albuterol (VENTOLIN HFA) 108 (90 Base) MCG/ACT inhaler Inhale 1-2 puffs into the lungs every 6 (six) hours as needed for wheezing or shortness of breath. 6.7 g 0   ALPRAZolam (XANAX) 0.25 MG tablet Take 1 tablet (0.25 mg total) by mouth 2 (two) times daily as needed for anxiety or sleep. 30 tablet 0   Budeson-Glycopyrrol-Formoterol (BREZTRI AEROSPHERE) 160-9-4.8 MCG/ACT AERO Inhale 2 puffs into the lungs in the morning and at bedtime. 5.9 g 0   budesonide-formoterol (SYMBICORT) 80-4.5 MCG/ACT inhaler Inhale 2 puffs into the lungs 2 (two) times daily.     FLUoxetine (PROZAC) 40 MG capsule Take 1 capsule (40 mg total) by mouth daily. 90 capsule 1   gabapentin (NEURONTIN) 300 MG capsule Take 1 capsule (300 mg total) by mouth 4 (four) times daily. 120 capsule 2   levothyroxine (SYNTHROID) 150 MCG tablet Take 175 mg tablet daily on Saturday and Sunday every week, take 150 mg tablet once daily Monday through Friday every week and repeat therafter 90 tablet 3   levothyroxine (SYNTHROID) 175 MCG tablet Take 175 mg tablet daily on Saturday and Sunday every week, take 150 mg tablet once daily Monday through  Friday every week and repeat therafter 30 tablet 1   meloxicam (MOBIC) 7.5 MG tablet Take 1 tablet (7.5 mg total) by mouth 2 (two) times daily. 180 tablet 0   montelukast (SINGULAIR) 10 MG tablet Take 1 tablet (10 mg total) by mouth at bedtime. 30 tablet 0   pantoprazole (PROTONIX) 40 MG tablet Take 1 tablet (40 mg total) by mouth daily. 90 tablet 3   predniSONE (DELTASONE) 10 MG tablet Take 2 tablets x 5 days; then 1 tablet x 5 days; then stop (start on 11/21/21) 15 tablet 0   Vitamin D, Ergocalciferol, (DRISDOL) 1.25 MG (50000 UNIT) CAPS capsule Take 1 capsule (50,000 Units total) by mouth every 7 (seven) days for 8 doses. 8 capsule 0   Facility-Administered Medications Prior to Visit  Medication Dose Route Frequency Provider Last Rate Last Admin   ipratropium-albuterol (DUONEB) 0.5-2.5 (3) MG/3ML nebulizer solution 3 mL  3 mL Nebulization Q6H Martyn Ehrich, NP   3 mL at 11/17/21  1357      Review of Systems  Review of Systems  Constitutional: Negative.   HENT: Negative.    Respiratory:  Negative for cough, shortness of breath and wheezing.   Cardiovascular: Negative.      Physical Exam  LMP 11/15/2021 (Approximate)  Physical Exam Constitutional:      Appearance: Normal appearance.  HENT:     Head: Normocephalic and atraumatic.  Cardiovascular:     Rate and Rhythm: Normal rate and regular rhythm.  Pulmonary:     Effort: Pulmonary effort is normal.     Breath sounds: No wheezing or rhonchi.     Comments: CTA, diminished Musculoskeletal:        General: Normal range of motion.  Skin:    General: Skin is warm and dry.  Neurological:     General: No focal deficit present.     Mental Status: She is alert and oriented to person, place, and time. Mental status is at baseline.  Psychiatric:        Mood and Affect: Mood normal.        Behavior: Behavior normal.        Thought Content: Thought content normal.        Judgment: Judgment normal.      Lab Results:  CBC     Component Value Date/Time   WBC 6.9 11/22/2021 1252   RBC 4.50 11/22/2021 1252   HGB 10.8 (L) 11/22/2021 1252   HGB 11.6 (L) 05/13/2013 0449   HCT 34.1 (L) 11/22/2021 1252   HCT 36.2 05/13/2013 0449   PLT 393.0 11/22/2021 1252   PLT 264 05/13/2013 0449   MCV 75.9 (L) 11/22/2021 1252   MCV 82 05/13/2013 0449   MCH 24.1 (L) 11/15/2021 0457   MCHC 31.5 11/22/2021 1252   RDW 18.7 (H) 11/22/2021 1252   RDW 15.3 (H) 05/13/2013 0449   LYMPHSABS 1.6 11/22/2021 1252   LYMPHSABS 2.4 05/13/2013 0449   MONOABS 0.3 11/22/2021 1252   MONOABS 0.9 05/13/2013 0449   EOSABS 0.0 11/22/2021 1252   EOSABS 0.0 05/13/2013 0449   BASOSABS 0.0 11/22/2021 1252   BASOSABS 0.0 05/13/2013 0449    BMET    Component Value Date/Time   NA 137 11/22/2021 1252   NA 136 05/11/2013 0341   K 4.5 11/22/2021 1252   K 4.1 05/11/2013 0341   CL 100 11/22/2021 1252   CL 106 05/11/2013 0341   CO2 29 11/22/2021 1252   CO2 27 05/11/2013 0341   GLUCOSE 105 (H) 11/22/2021 1252   GLUCOSE 143 (H) 05/11/2013 0341   BUN 16 11/22/2021 1252   BUN 12 05/11/2013 0341   CREATININE 0.70 11/22/2021 1252   CREATININE 0.71 05/15/2013 0629   CALCIUM 9.8 11/22/2021 1252   CALCIUM 8.7 05/11/2013 0341   GFRNONAA >60 11/15/2021 0457   GFRNONAA >60 05/15/2013 0629   GFRAA >60 10/25/2019 0739   GFRAA >60 05/15/2013 0629    BNP No results found for: "BNP"  ProBNP    Component Value Date/Time   PROBNP 106.5 01/28/2011 2052    Imaging: CT Angio Chest PE W/Cm &/Or Wo Cm  Result Date: 11/12/2021 CLINICAL DATA:  PE suspected. Repeat scan due to lack of contrast in the pulmonary veins. Shortness of breath since last night. Patient reports recent pneumonia. EXAM: CT ANGIOGRAPHY CHEST WITH CONTRAST TECHNIQUE: Multidetector CT imaging of the chest was performed using the standard protocol during bolus administration of intravenous contrast. Multiplanar CT image reconstructions and MIPs were obtained to  evaluate the vascular  anatomy. RADIATION DOSE REDUCTION: This exam was performed according to the departmental dose-optimization program which includes automated exposure control, adjustment of the mA and/or kV according to patient size and/or use of iterative reconstruction technique. CONTRAST:  169m OMNIPAQUE IOHEXOL 350 MG/ML SOLN COMPARISON:  CT chest 05/27/2007 FINDINGS: Cardiovascular: Satisfactory opacification of the pulmonary arteries to the segmental level. No evidence of pulmonary embolism. Normal heart size. No pericardial effusion. The main pulmonary artery is dilated measuring up to 4 cm in diameter. Mediastinum/Nodes: No enlarged mediastinal, hilar, or axillary lymph nodes. Thyroid gland, trachea, and esophagus demonstrate no significant findings. Lungs/Pleura: Patchy airspace opacity is noted in the superior segment of the right lower lobe (series 9, image 72). Additional smaller patchy airspace opacities noted in the medial right lower lobe as well as the medial left lower lobe (series 9, image 74, 76). Linear subsegmental atelectasis noted in the medial left upper lobe, lower right upper lobe anteriorly, lateral aspect of the right middle lobe, as well as the medial left lung base, similar to 05/27/2007. Two 0.5 cm ground-glass opacities are noted in the medial right upper lobe (series 9, images 28, 38). No pleural effusion or pneumothorax. Upper Abdomen: No acute abnormality. Musculoskeletal: No chest wall abnormality. No acute or significant osseous findings. Review of the MIP images confirms the above findings. IMPRESSION: 1. No evidence of pulmonary embolism. 2. Multifocal patchy airspace opacities, most prominent in the superior segment of the right lower lobe, consistent with multifocal pneumonia. 3. Two 0.5 cm ground-glass opacities in the medial right upper lobe, likely infectious/inflammatory given the multifocal opacities described above. 4. Dilated main pulmonary artery measuring up to 4 cm in diameter,  suggestive of underlying pulmonary arterial hypertension. 5. Scattered areas of linear subsegmental atelectasis, similar to 05/27/2007. Electronically Signed   By: LIleana RoupM.D.   On: 11/12/2021 16:18   DG Chest Port 1 View  Result Date: 11/12/2021 CLINICAL DATA:  Shortness of breath, productive cough EXAM: PORTABLE CHEST 1 VIEW COMPARISON:  10/24/2021 FINDINGS: The heart size and mediastinal contours are within normal limits. Improved, although persistent scattered heterogeneous opacity of the lung bases. The visualized skeletal structures are unremarkable. IMPRESSION: Improved, although persistent scattered heterogeneous opacity of the lung bases, suggesting improved infection or aspiration. No new airspace opacity. Electronically Signed   By: ADelanna AhmadiM.D.   On: 11/12/2021 15:16     Assessment & Plan:   No problem-specific Assessment & Plan notes found for this encounter.     EMartyn Ehrich NP 12/02/2021

## 2021-12-02 NOTE — Patient Instructions (Addendum)
Glad you are feeling better [employer] on quitting smoking It is essential that you maintain your abstinence from smoking  Recommendations Stop Breztri Aerosphere after nights dose  Resume Symbicort 80 mcg tomorrow- take 2 puffs every morning and evening Start Spiriva Respimat tomorrow- take 2 puffs every morning (2 week sample given and Rx sent- let me know if not affordable)  Orders: CXR today (ordered)   Follow-up: 3 months with Beth NP or sooner if needed   Chronic Obstructive Pulmonary Disease  Chronic obstructive pulmonary disease (COPD) is a long-term (chronic) lung problem. When you have COPD, it is hard for air to get in and out of your lungs. Usually the condition gets worse over time, and your lungs will never return to normal. There are things you can do to keep yourself as healthy as possible. What are the causes? Smoking. This is the most common cause. Certain genes passed from parent to child (inherited). What increases the risk? Being exposed to secondhand smoke from cigarettes, pipes, or cigars. Being exposed to chemicals and other irritants, such as fumes and dust in the work environment. Having chronic lung conditions or infections. What are the signs or symptoms? Shortness of breath, especially during physical activity. A long-term cough with a large amount of thick mucus. Sometimes, the cough may not have any mucus (dry cough). Wheezing. Breathing quickly. Skin that looks gray or blue, especially in the fingers, toes, or lips. Feeling tired (fatigue). Weight loss. Chest tightness. Having infections often. Episodes when breathing symptoms become much worse (exacerbations). At the later stages of this disease, you may have swelling in the ankles, feet, or legs. How is this treated? Taking medicines. Quitting smoking, if you smoke. Rehabilitation. This includes steps to make your body work better. It may involve a team of specialists. Doing  exercises. Making changes to your diet. Using oxygen. Lung surgery. Lung transplant. Comfort measures (palliative care). Follow these instructions at home: Medicines Take over-the-counter and prescription medicines only as told by your doctor. Talk to your doctor before taking any cough or allergy medicines. You may need to avoid medicines that cause your lungs to be dry. Lifestyle If you smoke, stop smoking. Smoking makes the problem worse. Do not smoke or use any products that contain nicotine or tobacco. If you need help quitting, ask your doctor. Avoid being around things that make your breathing worse. This may include smoke, chemicals, and fumes. Stay active, but remember to rest as well. Learn and use tips on how to manage stress and control your breathing. Make sure you get enough sleep. Most adults need at least 7 hours of sleep every night. Eat healthy foods. Eat smaller meals more often. Rest before meals. Controlled breathing Learn and use tips on how to control your breathing as told by your doctor. Try: Breathing in (inhaling) through your nose for 1 second. Then, pucker your lips and breath out (exhale) through your lips for 2 seconds. Putting one hand on your belly (abdomen). Breathe in slowly through your nose for 1 second. Your hand on your belly should move out. Pucker your lips and breathe out slowly through your lips. Your hand on your belly should move in as you breathe out.  Controlled coughing Learn and use controlled coughing to clear mucus from your lungs. Follow these steps: Lean your head a little forward. Breathe in deeply. Try to hold your breath for 3 seconds. Keep your mouth slightly open while coughing 2 times. Spit any mucus out into  a tissue. Rest and do the steps again 1 or 2 times as needed. General instructions Make sure you get all the shots (vaccines) that your doctor recommends. Ask your doctor about a flu shot and a pneumonia shot. Use oxygen  therapy and pulmonary rehabilitation if told by your doctor. If you need home oxygen therapy, ask your doctor if you should buy a tool to measure your oxygen level (oximeter). Make a COPD action plan with your doctor. This helps you to know what to do if you feel worse than usual. Manage any other conditions you have as told by your doctor. Avoid going outside when it is very hot, cold, or humid. Avoid people who have a sickness you can catch (contagious). Keep all follow-up visits. Contact a doctor if: You cough up more mucus than usual. There is a change in the color or thickness of the mucus. It is harder to breathe than usual. Your breathing is faster than usual. You have trouble sleeping. You need to use your medicines more often than usual. You have trouble doing your normal activities such as getting dressed or walking around the house. Get help right away if: You have shortness of breath while resting. You have shortness of breath that stops you from: Being able to talk. Doing normal activities. Your chest hurts for longer than 5 minutes. Your skin color is more blue than usual. Your pulse oximeter shows that you have low oxygen for longer than 5 minutes. You have a fever. You feel too tired to breathe normally. These symptoms may represent a serious problem that is an emergency. Do not wait to see if the symptoms will go away. Get medical help right away. Call your local emergency services (911 in the [place] Do not drive yourself to the hospital. Summary Chronic obstructive pulmonary disease (COPD) is a long-term lung problem. The way your lungs work will never return to normal. Usually the condition gets worse over time. There are things you can do to keep yourself as healthy as possible. Take over-the-counter and prescription medicines only as told by your doctor. If you smoke, stop. Smoking makes the problem worse. This information is not intended to replace advice given to  you by your health care provider. Make sure you discuss any questions you have with your health care provider. Document Revised: 12/09/2019 Document Reviewed: 12/09/2019 Elsevier Patient Education  [place].

## 2021-12-05 NOTE — Assessment & Plan Note (Signed)
-   Patient quit smoking, support given and encouraged continued abstinence

## 2021-12-05 NOTE — Assessment & Plan Note (Signed)
-  Admitted in late September-October 2023 for COPD exacerbation/PNA. Clinical symptoms have improved/resolved - Repeat CXR today

## 2021-12-05 NOTE — Assessment & Plan Note (Signed)
-   She is feeling well today, cough and shortness of breath have improved. She has residual fatigue from pneumonia. Breztri no covered. We will put her back on Symbicort 148mg 2 puffs BId and add spiriva respimat 2.551m two puffs daily

## 2021-12-06 ENCOUNTER — Telehealth: Payer: Self-pay | Admitting: Primary Care

## 2021-12-06 DIAGNOSIS — J189 Pneumonia, unspecified organism: Secondary | ICD-10-CM

## 2021-12-06 NOTE — Telephone Encounter (Signed)
Kari Ehrich, NP  12/02/2021  3:43 PM EDT     Please let patient know CXR showed similar appearing opacities/ residual pneumonia. It can take 4-8 weeks to clear on imaging. Please have patient repeat CXR in 4 weeks re: pneumonia, if not improved we will get CT chest to re-evaluate    Called and spoke with patient. She verbalized understanding. I have placed the CXR order for 4 weeks. She is aware of the times to come for xray.   Nothing further needed at time of call.

## 2021-12-09 ENCOUNTER — Ambulatory Visit: Payer: BC Managed Care – PPO | Admitting: Family

## 2021-12-12 ENCOUNTER — Other Ambulatory Visit: Payer: Self-pay | Admitting: Family

## 2021-12-12 DIAGNOSIS — J209 Acute bronchitis, unspecified: Secondary | ICD-10-CM

## 2021-12-15 ENCOUNTER — Other Ambulatory Visit: Payer: Self-pay | Admitting: Family

## 2021-12-15 DIAGNOSIS — J209 Acute bronchitis, unspecified: Secondary | ICD-10-CM

## 2021-12-16 ENCOUNTER — Encounter: Payer: Self-pay | Admitting: Family

## 2021-12-16 ENCOUNTER — Ambulatory Visit (INDEPENDENT_AMBULATORY_CARE_PROVIDER_SITE_OTHER): Payer: BC Managed Care – PPO

## 2021-12-16 DIAGNOSIS — J189 Pneumonia, unspecified organism: Secondary | ICD-10-CM | POA: Diagnosis not present

## 2021-12-16 DIAGNOSIS — J9811 Atelectasis: Secondary | ICD-10-CM | POA: Diagnosis not present

## 2021-12-16 DIAGNOSIS — J209 Acute bronchitis, unspecified: Secondary | ICD-10-CM

## 2021-12-16 MED ORDER — ALBUTEROL SULFATE HFA 108 (90 BASE) MCG/ACT IN AERS: INHALATION_SPRAY | RESPIRATORY_TRACT | 3 refills | Status: DC

## 2021-12-19 NOTE — Telephone Encounter (Signed)
CXR showed improving aeration of lungs with some residual areas of atelectasis. Encourage deep breathing exercises every hour and mucinex if she is not taking

## 2021-12-31 DIAGNOSIS — J441 Chronic obstructive pulmonary disease with (acute) exacerbation: Secondary | ICD-10-CM | POA: Diagnosis not present

## 2021-12-31 DIAGNOSIS — J9621 Acute and chronic respiratory failure with hypoxia: Secondary | ICD-10-CM | POA: Diagnosis not present

## 2021-12-31 DIAGNOSIS — J449 Chronic obstructive pulmonary disease, unspecified: Secondary | ICD-10-CM | POA: Diagnosis not present

## 2022-01-17 ENCOUNTER — Other Ambulatory Visit: Payer: Self-pay | Admitting: Family

## 2022-01-30 DIAGNOSIS — J9621 Acute and chronic respiratory failure with hypoxia: Secondary | ICD-10-CM | POA: Diagnosis not present

## 2022-01-30 DIAGNOSIS — J449 Chronic obstructive pulmonary disease, unspecified: Secondary | ICD-10-CM | POA: Diagnosis not present

## 2022-01-30 DIAGNOSIS — J441 Chronic obstructive pulmonary disease with (acute) exacerbation: Secondary | ICD-10-CM | POA: Diagnosis not present

## 2022-02-07 ENCOUNTER — Encounter (HOSPITAL_COMMUNITY): Payer: Self-pay | Admitting: *Deleted

## 2022-02-07 ENCOUNTER — Inpatient Hospital Stay (HOSPITAL_COMMUNITY)
Admission: EM | Admit: 2022-02-07 | Discharge: 2022-02-13 | DRG: 193 | Disposition: A | Discharge status: Home / Self Care | Payer: Self-pay | Attending: Internal Medicine | Admitting: Internal Medicine

## 2022-02-07 ENCOUNTER — Emergency Department (HOSPITAL_COMMUNITY): Payer: Self-pay

## 2022-02-07 ENCOUNTER — Other Ambulatory Visit: Payer: Self-pay

## 2022-02-07 DIAGNOSIS — J9601 Acute respiratory failure with hypoxia: Secondary | ICD-10-CM | POA: Diagnosis present

## 2022-02-07 DIAGNOSIS — J101 Influenza due to other identified influenza virus with other respiratory manifestations: Principal | ICD-10-CM | POA: Diagnosis present

## 2022-02-07 DIAGNOSIS — Z825 Family history of asthma and other chronic lower respiratory diseases: Secondary | ICD-10-CM

## 2022-02-07 DIAGNOSIS — Z28310 Unvaccinated for covid-19: Secondary | ICD-10-CM

## 2022-02-07 DIAGNOSIS — E039 Hypothyroidism, unspecified: Secondary | ICD-10-CM | POA: Diagnosis present

## 2022-02-07 DIAGNOSIS — Z87891 Personal history of nicotine dependence: Secondary | ICD-10-CM

## 2022-02-07 DIAGNOSIS — G8929 Other chronic pain: Secondary | ICD-10-CM

## 2022-02-07 DIAGNOSIS — Z7951 Long term (current) use of inhaled steroids: Secondary | ICD-10-CM

## 2022-02-07 DIAGNOSIS — Z91199 Patient's noncompliance with other medical treatment and regimen due to unspecified reason: Secondary | ICD-10-CM

## 2022-02-07 DIAGNOSIS — Z79899 Other long term (current) drug therapy: Secondary | ICD-10-CM

## 2022-02-07 DIAGNOSIS — J441 Chronic obstructive pulmonary disease with (acute) exacerbation: Secondary | ICD-10-CM | POA: Diagnosis present

## 2022-02-07 DIAGNOSIS — F419 Anxiety disorder, unspecified: Secondary | ICD-10-CM | POA: Diagnosis present

## 2022-02-07 DIAGNOSIS — Z1152 Encounter for screening for COVID-19: Secondary | ICD-10-CM

## 2022-02-07 DIAGNOSIS — Z8249 Family history of ischemic heart disease and other diseases of the circulatory system: Secondary | ICD-10-CM

## 2022-02-07 DIAGNOSIS — K219 Gastro-esophageal reflux disease without esophagitis: Secondary | ICD-10-CM | POA: Diagnosis present

## 2022-02-07 DIAGNOSIS — R9431 Abnormal electrocardiogram [ECG] [EKG]: Secondary | ICD-10-CM | POA: Insufficient documentation

## 2022-02-07 DIAGNOSIS — F329 Major depressive disorder, single episode, unspecified: Secondary | ICD-10-CM | POA: Diagnosis present

## 2022-02-07 DIAGNOSIS — J209 Acute bronchitis, unspecified: Secondary | ICD-10-CM

## 2022-02-07 DIAGNOSIS — E876 Hypokalemia: Secondary | ICD-10-CM | POA: Diagnosis present

## 2022-02-07 LAB — RESP PANEL BY RT-PCR (RSV, FLU A&B, COVID)  RVPGX2
Influenza A by PCR: POSITIVE — AB
Influenza B by PCR: NEGATIVE
Resp Syncytial Virus by PCR: NEGATIVE
SARS Coronavirus 2 by RT PCR: NEGATIVE

## 2022-02-07 LAB — BASIC METABOLIC PANEL
Anion gap: 13 (ref 5–15)
BUN: 10 mg/dL (ref 6–20)
CO2: 19 mmol/L — ABNORMAL LOW (ref 22–32)
Calcium: 8.7 mg/dL — ABNORMAL LOW (ref 8.9–10.3)
Chloride: 102 mmol/L (ref 98–111)
Creatinine, Ser: 0.75 mg/dL (ref 0.44–1.00)
GFR, Estimated: >60 mL/min (ref >60)
Glucose, Bld: 95 mg/dL (ref 70–99)
Potassium: 3.7 mmol/L (ref 3.5–5.1)
Sodium: 134 mmol/L — ABNORMAL LOW (ref 135–145)

## 2022-02-07 LAB — CBC WITH DIFFERENTIAL/PLATELET
Abs Immature Granulocytes: 0.03 10*3/uL (ref 0.00–0.07)
Basophils Absolute: 0 10*3/uL (ref 0.0–0.1)
Basophils Relative: 0 %
Eosinophils Absolute: 0 10*3/uL (ref 0.0–0.5)
Eosinophils Relative: 0 %
HCT: 36.1 % (ref 36.0–46.0)
Hemoglobin: 10.9 g/dL — ABNORMAL LOW (ref 12.0–15.0)
Immature Granulocytes: 0 %
Lymphocytes Relative: 9 %
Lymphs Abs: 0.7 10*3/uL (ref 0.7–4.0)
MCH: 23.9 pg — ABNORMAL LOW (ref 26.0–34.0)
MCHC: 30.2 g/dL (ref 30.0–36.0)
MCV: 79 fL — ABNORMAL LOW (ref 80.0–100.0)
Monocytes Absolute: 0.6 10*3/uL (ref 0.1–1.0)
Monocytes Relative: 8 %
Neutro Abs: 6 10*3/uL (ref 1.7–7.7)
Neutrophils Relative %: 83 %
Platelets: 263 10*3/uL (ref 150–400)
RBC: 4.57 MIL/uL (ref 3.87–5.11)
RDW: 16.3 % — ABNORMAL HIGH (ref 11.5–15.5)
WBC: 7.3 10*3/uL (ref 4.0–10.5)
nRBC: 0 % (ref 0.0–0.2)

## 2022-02-07 LAB — TROPONIN I (HIGH SENSITIVITY): Troponin I (High Sensitivity): 4 ng/L (ref <18)

## 2022-02-07 LAB — LACTIC ACID, PLASMA
Lactic Acid, Venous: 1.1 mmol/L (ref 0.5–1.9)
Lactic Acid, Venous: 1.3 mmol/L (ref 0.5–1.9)

## 2022-02-07 LAB — BLOOD GAS, VENOUS
Acid-base deficit: 5.4 mmol/L — ABNORMAL HIGH (ref 0.0–2.0)
Bicarbonate: 23.5 mmol/L (ref 20.0–28.0)
Drawn by: 7012
O2 Saturation: 41 %
Patient temperature: 37.9
pCO2, Ven: 66 mmHg — ABNORMAL HIGH (ref 44–60)
pH, Ven: 7.17 — CL (ref 7.25–7.43)
pO2, Ven: 33 mmHg (ref 32–45)

## 2022-02-07 LAB — BRAIN NATRIURETIC PEPTIDE: B Natriuretic Peptide: 59 pg/mL (ref 0.0–100.0)

## 2022-02-07 MED ORDER — OSELTAMIVIR PHOSPHATE 75 MG PO CAPS
75.0000 mg | ORAL_CAPSULE | Freq: Two times a day (BID) | ORAL | Status: AC
  Administered 2022-02-07 – 2022-02-12 (×10): 75 mg via ORAL
  Filled 2022-02-07 (×13): qty 1

## 2022-02-07 MED ORDER — METHYLPREDNISOLONE SODIUM SUCC 125 MG IJ SOLR
125.0000 mg | Freq: Two times a day (BID) | INTRAMUSCULAR | Status: AC
  Administered 2022-02-08 (×2): 125 mg via INTRAVENOUS
  Filled 2022-02-07 (×2): qty 2

## 2022-02-07 MED ORDER — DIPHENHYDRAMINE HCL 50 MG/ML IJ SOLN
25.0000 mg | Freq: Once | INTRAMUSCULAR | Status: AC
  Administered 2022-02-07: 25 mg via INTRAVENOUS
  Filled 2022-02-07: qty 1

## 2022-02-07 MED ORDER — OXYCODONE HCL 5 MG PO TABS
5.0000 mg | ORAL_TABLET | ORAL | Status: DC | PRN
  Administered 2022-02-09 – 2022-02-13 (×13): 5 mg via ORAL
  Filled 2022-02-07 (×13): qty 1

## 2022-02-07 MED ORDER — PREDNISONE 20 MG PO TABS
40.0000 mg | ORAL_TABLET | Freq: Every day | ORAL | Status: AC
  Administered 2022-02-09 – 2022-02-12 (×4): 40 mg via ORAL
  Filled 2022-02-07 (×4): qty 2

## 2022-02-07 MED ORDER — ALBUTEROL SULFATE (2.5 MG/3ML) 0.083% IN NEBU
2.5000 mg | INHALATION_SOLUTION | Freq: Once | RESPIRATORY_TRACT | Status: AC
  Administered 2022-02-07: 2.5 mg via RESPIRATORY_TRACT
  Filled 2022-02-07: qty 3

## 2022-02-07 MED ORDER — ONDANSETRON HCL 4 MG PO TABS: 4.0000 mg | ORAL_TABLET | Freq: Four times a day (QID) | ORAL | Status: DC | PRN

## 2022-02-07 MED ORDER — ONDANSETRON HCL 4 MG/2ML IJ SOLN
4.0000 mg | Freq: Once | INTRAMUSCULAR | Status: AC
  Administered 2022-02-07: 4 mg via INTRAVENOUS
  Filled 2022-02-07: qty 2

## 2022-02-07 MED ORDER — METOCLOPRAMIDE HCL 5 MG/5ML PO SOLN
10.0000 mg | Freq: Once | ORAL | Status: AC
  Administered 2022-02-07: 10 mg via ORAL
  Filled 2022-02-07: qty 10

## 2022-02-07 MED ORDER — LEVALBUTEROL HCL 0.63 MG/3ML IN NEBU
0.6300 mg | INHALATION_SOLUTION | Freq: Four times a day (QID) | RESPIRATORY_TRACT | Status: DC | PRN
  Administered 2022-02-08: 0.63 mg via RESPIRATORY_TRACT
  Filled 2022-02-07: qty 3

## 2022-02-07 MED ORDER — SODIUM CHLORIDE 0.9 % IV SOLN: INTRAVENOUS | Status: AC

## 2022-02-07 MED ORDER — IPRATROPIUM-ALBUTEROL 0.5-2.5 (3) MG/3ML IN SOLN
3.0000 mL | Freq: Four times a day (QID) | RESPIRATORY_TRACT | Status: DC
  Administered 2022-02-07 – 2022-02-13 (×22): 3 mL via RESPIRATORY_TRACT
  Filled 2022-02-07 (×22): qty 3

## 2022-02-07 MED ORDER — SODIUM CHLORIDE 0.9 % IV BOLUS
1000.0000 mL | Freq: Once | INTRAVENOUS | Status: AC
  Administered 2022-02-07: 1000 mL via INTRAVENOUS

## 2022-02-07 MED ORDER — ACETAMINOPHEN 325 MG PO TABS
650.0000 mg | ORAL_TABLET | Freq: Four times a day (QID) | ORAL | Status: DC | PRN
  Administered 2022-02-09 – 2022-02-10 (×2): 650 mg via ORAL
  Filled 2022-02-07 (×2): qty 2

## 2022-02-07 MED ORDER — ACETAMINOPHEN 650 MG RE SUPP: 650.0000 mg | Freq: Four times a day (QID) | RECTAL | Status: DC | PRN

## 2022-02-07 MED ORDER — MORPHINE SULFATE (PF) 2 MG/ML IV SOLN
2.0000 mg | INTRAVENOUS | Status: DC | PRN
  Administered 2022-02-07 – 2022-02-10 (×7): 2 mg via INTRAVENOUS
  Filled 2022-02-07 (×7): qty 1

## 2022-02-07 MED ORDER — ACETAMINOPHEN 650 MG RE SUPP
650.0000 mg | Freq: Once | RECTAL | Status: AC
  Administered 2022-02-07: 650 mg via RECTAL
  Filled 2022-02-07: qty 1

## 2022-02-07 MED ORDER — KETOROLAC TROMETHAMINE 30 MG/ML IJ SOLN
30.0000 mg | Freq: Once | INTRAMUSCULAR | Status: AC
  Administered 2022-02-07: 30 mg via INTRAVENOUS
  Filled 2022-02-07: qty 1

## 2022-02-07 MED ORDER — BUDESONIDE 0.25 MG/2ML IN SUSP
0.2500 mg | Freq: Two times a day (BID) | RESPIRATORY_TRACT | Status: DC
  Administered 2022-02-07 – 2022-02-13 (×12): 0.25 mg via RESPIRATORY_TRACT
  Filled 2022-02-07 (×12): qty 2

## 2022-02-07 MED ORDER — ONDANSETRON HCL 4 MG/2ML IJ SOLN
4.0000 mg | Freq: Four times a day (QID) | INTRAMUSCULAR | Status: DC | PRN
  Administered 2022-02-10 – 2022-02-12 (×4): 4 mg via INTRAVENOUS
  Filled 2022-02-07 (×4): qty 2

## 2022-02-07 MED ORDER — HEPARIN SODIUM (PORCINE) 5000 UNIT/ML IJ SOLN
5000.0000 [IU] | Freq: Three times a day (TID) | INTRAMUSCULAR | Status: DC
  Administered 2022-02-07 – 2022-02-10 (×8): 5000 [IU] via SUBCUTANEOUS
  Filled 2022-02-07 (×7): qty 1

## 2022-02-07 NOTE — ED Provider Notes (Signed)
[facility] EMERGENCY DEPARTMENT Provider Note   CSN: [id number] Arrival date & time: 02/07/22  1652     History  Chief Complaint  Patient presents with   Shortness of Breath    [name] is a 46 y.o. female.  Pt is a 46 yo female with a pmhx significant for COPD, Asthma, depression, tobacco and alcohol abuse.  Pt is not a good historian as she is very sob and on cpap.  Pt said she's been sob for the past 3 days.  She called EMS today b/c sob was worsening.  Pt's O2 sat in the 80s for EMS.  They gave her oxygen without improvement.  They put her on cpap, gave her 2 duonebs, magnesium, and 2 doses of epi and sats are finally in the mid-90s.  EMS said she looks much better now than when they arrived.       Home Medications Prior to Admission medications   Medication Sig Start Date End Date Taking? Authorizing Provider  albuterol (PROVENTIL) (2.5 MG/3ML) 0.083% nebulizer solution Take 3 mLs (2.5 mg total) by nebulization every 6 (six) hours as needed for wheezing or shortness of breath. For asthma 12/11/20  Yes Kathie Dike, MD  albuterol (VENTOLIN HFA) 108 (90 Base) MCG/ACT inhaler INHALE 1 TO 2 PUFFS BY MOUTH EVERY 6 HOURS AS NEEDED FOR WHEEZING OR SHORTNESS OF BREATH 12/16/21  Yes Martyn Ehrich, NP  ALPRAZolam (XANAX) 0.25 MG tablet TAKE 1 TABLET BY MOUTH TWICE DAILY AS NEEDED FOR ANXIETY OR SLEEP Patient taking differently: Take 0.25 mg by mouth 2 (two) times daily as needed for anxiety or sleep. 01/18/22  Yes Dugal, Lawerance Bach, FNP  budesonide-formoterol (SYMBICORT) 80-4.5 MCG/ACT inhaler Inhale 2 puffs into the lungs 2 (two) times daily. 04/27/21  Yes [provider]  FLUoxetine (PROZAC) 40 MG capsule Take 1 capsule (40 mg total) by mouth daily. 10/24/21 04/22/22 Yes Dugal, Lawerance Bach, FNP  gabapentin (NEURONTIN) 300 MG capsule Take 1 capsule (300 mg total) by mouth 4 (four) times daily. Patient taking differently: Take 300 mg by mouth daily as needed (pain).  10/24/21 02/07/22 Yes Dugal, [name], FNP  ipratropium-albuterol (DUONEB) 0.5-2.5 (3) MG/3ML SOLN Take 3 mLs by nebulization every 4 (four) hours as needed (shortness of  breath). 11/16/21  Yes [provider]  levothyroxine (SYNTHROID) 150 MCG tablet Take 175 mg tablet daily on Saturday and Sunday every week, take 150 mg tablet once daily Monday through Friday every week and repeat therafter 12/01/21  Yes Eugenia Pancoast, FNP  levothyroxine (SYNTHROID) 175 MCG tablet Take 175 mg tablet daily on Saturday and Sunday every week, take 150 mg tablet once daily Monday through Friday every week and repeat therafter 11/23/21  Yes Dugal, [name], FNP  montelukast (SINGULAIR) 10 MG tablet Take 1 tablet (10 mg total) by mouth at bedtime. 10/24/21  Yes Dugal, Lawerance Bach, FNP  pantoprazole (PROTONIX) 40 MG tablet Take 1 tablet (40 mg total) by mouth daily. 10/24/21  Yes Dugal, Lawerance Bach, FNP  Tiotropium Bromide Monohydrate (SPIRIVA RESPIMAT) 2.5 MCG/ACT AERS Inhale 2 puffs into the lungs daily. 12/15/21  Yes Martyn Ehrich, NP      Allergies    Patient has no known allergies.    Review of Systems   Review of Systems  Respiratory:  Positive for cough, shortness of breath and wheezing.   All other systems reviewed and are negative.   Physical Exam Updated Vital Signs BP 102/65   Pulse (!) 125   Temp 100.3 F (37.9 C) (  Axillary)   Resp (!) 27   SpO2 99%  Physical Exam Vitals and nursing note reviewed.  Constitutional:      General: She is in acute distress.     Appearance: She is well-developed.  HENT:     Head: Normocephalic and atraumatic.     Mouth/Throat:     Mouth: Mucous membranes are dry.  Eyes:     Extraocular Movements: Extraocular movements intact.     Pupils: Pupils are equal, round, and reactive to light.  Cardiovascular:     Rate and Rhythm: Regular rhythm. Tachycardia present.  Pulmonary:     Effort: Tachypnea present.     Breath sounds: Wheezing present.  Abdominal:      General: Bowel sounds are normal.     Palpations: Abdomen is soft.  Musculoskeletal:        General: Normal range of motion.     Cervical back: Normal range of motion and neck supple.  Skin:    General: Skin is warm.     Capillary Refill: Capillary refill takes less than 2 seconds.  Neurological:     General: No focal deficit present.     Mental Status: She is alert and oriented to person, place, and time.  Psychiatric:        Mood and Affect: Mood normal.        Behavior: Behavior normal.     ED Results / Procedures / Treatments   Labs (all labs ordered are listed, but only abnormal results are displayed) Labs Reviewed  RESP PANEL BY RT-PCR (RSV, FLU A&B, COVID)  RVPGX2 - Abnormal; Notable for the following components:      Result Value   Influenza A by PCR POSITIVE (*)    All other components within normal limits  BASIC METABOLIC PANEL - Abnormal; Notable for the following components:   Sodium 134 (*)    CO2 19 (*)    Calcium 8.7 (*)    All other components within normal limits  CBC WITH DIFFERENTIAL/PLATELET - Abnormal; Notable for the following components:   Hemoglobin 10.9 (*)    MCV 79.0 (*)    MCH 23.9 (*)    RDW 16.3 (*)    All other components within normal limits  CULTURE, BLOOD (ROUTINE X 2)  CULTURE, BLOOD (ROUTINE X 2)  BRAIN NATRIURETIC PEPTIDE  LACTIC ACID, PLASMA  LACTIC ACID, PLASMA    EKG EKG Interpretation  Date/Time:  Tuesday February 07 2022 17:01:53 EST Ventricular Rate:  130 PR Interval:  124 QRS Duration: 72 QT Interval:  306 QTC Calculation: 450 R Axis:   78 Text Interpretation: Sinus tachycardia Nonspecific ST abnormality Abnormal ECG When compared with ECG of 12-Nov-2021 22:23, Questionable change in QRS axis ST now depressed in Inferior leads T wave inversion no longer evident in Inferior leads Nonspecific T wave abnormality now evident in Anterior leads Since last tracing rate faster Confirmed by Isla Pence [phone]) on 02/07/2022  5:51:31 PM  Radiology DG Chest Port 1 View  Result Date: 02/07/2022 CLINICAL DATA:  Shortness of breath EXAM: PORTABLE CHEST 1 VIEW COMPARISON:  December 16, 2021 FINDINGS: The heart size and mediastinal contours are within normal limits. Both lungs are clear. The visualized skeletal structures are unremarkable. IMPRESSION: No active disease. Electronically Signed   By: Dorise Bullion III [name]   On: 02/07/2022 17:35    Procedures Procedures    Medications Ordered in ED Medications  ketorolac (TORADOL) 30 MG/ML injection 30 mg (has no administration in time  range)  sodium chloride 0.9 % bolus 1,000 mL (0 mLs Intravenous Stopped 02/07/22 1905)  ondansetron (ZOFRAN) injection 4 mg (4 mg Intravenous Given 02/07/22 1734)  acetaminophen (TYLENOL) suppository 650 mg (650 mg Rectal Given 02/07/22 1859)  albuterol (PROVENTIL) (2.5 MG/3ML) 0.083% nebulizer solution 2.5 mg (2.5 mg Nebulization Given 02/07/22 1830)    ED Course/ Medical Decision Making/ A&P                           Medical Decision Making Amount and/or Complexity of Data Reviewed Labs: ordered. Radiology: ordered.  Risk OTC drugs. Prescription drug management. Decision regarding hospitalization.   This patient presents to the ED for concern of sob, this involves an extensive number of treatment options, and is a complaint that carries with it a high risk of complications and morbidity.  The differential diagnosis includes pna, covid/flu   Co morbidities that complicate the patient evaluation  COPD, Asthma, depression, tobacco and alcohol abuse   Additional history obtained:  Additional history obtained from epic chart review External records from outside source obtained and reviewed including EMS report   Lab Tests:  I Ordered, and personally interpreted labs.  The pertinent results include:  cbc with hgb low at 10.9 (stable), bmp nl; flu A +   Imaging Studies ordered:  I ordered imaging studies including  cxr  I independently visualized and interpreted imaging which showed No active disease.  I agree with the radiologist interpretation   Cardiac Monitoring:  The patient was maintained on a cardiac monitor.  I personally viewed and interpreted the cardiac monitored which showed an underlying rhythm of: st   Medicines ordered and prescription drug management:  I ordered medication including tylenol/nebs  for fever/sob  Reevaluation of the patient after these medicines showed that the patient improved I have reviewed the patients home medicines and have made adjustments as needed   Test Considered:  ct   Critical Interventions:  bipap   Consultations Obtained:  I requested consultation with the hospitalist (Dr. Clearence Ped),  and discussed lab and imaging findings as well as pertinent plan - she will admit   Problem List / ED Course:  Resp failure due to influenza A + COPD:  pt on bipap.  She looks more comfortable.  She's been given nebs, steroids, epi, and mg.     Reevaluation:  After the interventions noted above, I reevaluated the patient and found that they have :improved   Social Determinants of Health:  Lives at home   Dispostion:  After consideration of the diagnostic results and the patients response to treatment, I feel that the patent would benefit from admission.    CRITICAL CARE Performed by: Isla Pence   Total critical care time: 30 minutes  Critical care time was exclusive of separately billable procedures and treating other patients.  Critical care was necessary to treat or prevent imminent or life-threatening deterioration.  Critical care was time spent personally by me on the following activities: development of treatment plan with patient and/or surrogate as well as nursing, discussions with consultants, evaluation of patient's response to treatment, examination of patient, obtaining history from patient or surrogate, ordering and  performing treatments and interventions, ordering and review of laboratory studies, ordering and review of radiographic studies, pulse oximetry and re-evaluation of patient's condition.         Final Clinical Impression(s) / ED Diagnoses Final diagnoses:  Influenza A  COPD exacerbation ([place])  Acute respiratory failure  with hypoxia [facility])    Rx / DC Orders ED Discharge Orders     None         Isla Pence, MD 02/07/22 [id number]

## 2022-02-07 NOTE — ED Triage Notes (Signed)
Pt brought in by [facility] for c/o sob x 3 days; when ems arrived pt was found to have O2 sats in the 80's; pt placed on O2 with no improvement;  Pt was found to have inspiratory and expiratory wheezes  Ems administered 2 duonebs, magnesium IV and 2 doses of epi with no significant improvement; pt placed on cpap and sats increased to 94%  Pt c/o chest pain

## 2022-02-07 NOTE — ED Notes (Signed)
Date and time results received: 02/07/22 2059 (use smartphrase ".now" to insert current time)  Test: venous blood gas Critical Value: pH 7.17  Name of Provider Notified: A. Zierle-Ghosh, DO

## 2022-02-08 DIAGNOSIS — F329 Major depressive disorder, single episode, unspecified: Secondary | ICD-10-CM

## 2022-02-08 DIAGNOSIS — R9431 Abnormal electrocardiogram [ECG] [EKG]: Secondary | ICD-10-CM

## 2022-02-08 DIAGNOSIS — J9601 Acute respiratory failure with hypoxia: Secondary | ICD-10-CM

## 2022-02-08 DIAGNOSIS — E039 Hypothyroidism, unspecified: Secondary | ICD-10-CM

## 2022-02-08 DIAGNOSIS — J101 Influenza due to other identified influenza virus with other respiratory manifestations: Secondary | ICD-10-CM | POA: Insufficient documentation

## 2022-02-08 DIAGNOSIS — J441 Chronic obstructive pulmonary disease with (acute) exacerbation: Secondary | ICD-10-CM

## 2022-02-08 DIAGNOSIS — K219 Gastro-esophageal reflux disease without esophagitis: Secondary | ICD-10-CM

## 2022-02-08 LAB — CBC
HCT: 35.7 % — ABNORMAL LOW (ref 36.0–46.0)
Hemoglobin: 9.9 g/dL — ABNORMAL LOW (ref 12.0–15.0)
MCH: 23.9 pg — ABNORMAL LOW (ref 26.0–34.0)
MCHC: 27.7 g/dL — ABNORMAL LOW (ref 30.0–36.0)
MCV: 86 fL (ref 80.0–100.0)
Platelets: 237 10*3/uL (ref 150–400)
RBC: 4.15 MIL/uL (ref 3.87–5.11)
RDW: 15.9 % — ABNORMAL HIGH (ref 11.5–15.5)
WBC: 12.4 10*3/uL — ABNORMAL HIGH (ref 4.0–10.5)
nRBC: 0 % (ref 0.0–0.2)

## 2022-02-08 LAB — TROPONIN I (HIGH SENSITIVITY)
Troponin I (High Sensitivity): 9 ng/L (ref <18)
Troponin I (High Sensitivity): 10 ng/L (ref <18)

## 2022-02-08 LAB — BLOOD GAS, VENOUS
Acid-base deficit: 2.9 mmol/L — ABNORMAL HIGH (ref 0.0–2.0)
Bicarbonate: 28.3 mmol/L — ABNORMAL HIGH (ref 20.0–28.0)
Drawn by: 478
O2 Saturation: 19.2 %
Patient temperature: 37
pCO2, Ven: 83 mmHg (ref 44–60)
pH, Ven: 7.14 — CL (ref 7.25–7.43)
pO2, Ven: <31 mmHg — CL (ref 32–45)

## 2022-02-08 LAB — COMPREHENSIVE METABOLIC PANEL
ALT: 22 U/L (ref 0–44)
AST: 39 U/L (ref 15–41)
Albumin: 4.2 g/dL (ref 3.5–5.0)
Alkaline Phosphatase: 62 U/L (ref 38–126)
Anion gap: 9 (ref 5–15)
BUN: 19 mg/dL (ref 6–20)
CO2: 24 mmol/L (ref 22–32)
Calcium: 8.4 mg/dL — ABNORMAL LOW (ref 8.9–10.3)
Chloride: 106 mmol/L (ref 98–111)
Creatinine, Ser: 1.01 mg/dL — ABNORMAL HIGH (ref 0.44–1.00)
GFR, Estimated: >60 mL/min (ref >60)
Glucose, Bld: 137 mg/dL — ABNORMAL HIGH (ref 70–99)
Potassium: 4.8 mmol/L (ref 3.5–5.1)
Sodium: 139 mmol/L (ref 135–145)
Total Bilirubin: 0.4 mg/dL (ref 0.3–1.2)
Total Protein: 7.6 g/dL (ref 6.5–8.1)

## 2022-02-08 LAB — MRSA NEXT GEN BY PCR, NASAL: MRSA by PCR Next Gen: NOT DETECTED

## 2022-02-08 LAB — MAGNESIUM: Magnesium: 2.2 mg/dL (ref 1.7–2.4)

## 2022-02-08 LAB — HIV ANTIBODY (ROUTINE TESTING W REFLEX): HIV Screen 4th Generation wRfx: NONREACTIVE

## 2022-02-08 MED ORDER — LORAZEPAM 2 MG/ML IJ SOLN
0.5000 mg | Freq: Four times a day (QID) | INTRAMUSCULAR | Status: AC | PRN
  Administered 2022-02-08 – 2022-02-10 (×3): 0.5 mg via INTRAVENOUS
  Filled 2022-02-08 (×3): qty 1

## 2022-02-08 MED ORDER — LORAZEPAM 2 MG/ML IJ SOLN
1.0000 mg | Freq: Once | INTRAMUSCULAR | Status: AC
  Administered 2022-02-08: 1 mg via INTRAVENOUS
  Filled 2022-02-08: qty 1

## 2022-02-08 MED ORDER — ORAL CARE MOUTH RINSE
15.0000 mL | OROMUCOSAL | Status: DC
  Administered 2022-02-08 – 2022-02-13 (×14): 15 mL via OROMUCOSAL

## 2022-02-08 MED ORDER — ORAL CARE MOUTH RINSE: 15.0000 mL | OROMUCOSAL | Status: DC | PRN

## 2022-02-08 MED ORDER — CHLORHEXIDINE GLUCONATE CLOTH 2 % EX PADS
6.0000 | MEDICATED_PAD | Freq: Every day | CUTANEOUS | Status: DC
  Administered 2022-02-08 – 2022-02-10 (×3): 6 via TOPICAL

## 2022-02-08 MED ORDER — METOPROLOL TARTRATE 5 MG/5ML IV SOLN
5.0000 mg | Freq: Once | INTRAVENOUS | Status: AC
  Administered 2022-02-08: 5 mg via INTRAVENOUS
  Filled 2022-02-08: qty 5

## 2022-02-08 MED ORDER — HALOPERIDOL LACTATE 5 MG/ML IJ SOLN
2.0000 mg | Freq: Once | INTRAMUSCULAR | Status: AC
  Administered 2022-02-08: 2 mg via INTRAVENOUS
  Filled 2022-02-08: qty 1

## 2022-02-08 NOTE — Assessment & Plan Note (Signed)
-  O2 sats down to 82% -Currently on BiPAP 60% FiO2 -Continue to treat COPD, and continue BiPAP -minimal depressions on EKG with trop 4 - depressions likely related to hypoxia and rate -Monitor on tele -Continue to monitor

## 2022-02-08 NOTE — ED Notes (Signed)
Breakfast tray given. °

## 2022-02-08 NOTE — Progress Notes (Signed)
Same day note   [name] is a 46 y.o. female with medical history significant of abuse, COPD, depression, hepatitis C, tobacco use disorder, presented to hospital with dyspnea cough with productive yellow sputum and fever for 2 days.  She took albuterol and DuoNeb at home without much relief.  Patient complained of pain in the chest headache.  Past history of smoker quit 6 months ago.  Patient tested positive for influenza A.  Patient seen and examined at bedside.  Patient was admitted to the hospital for dyspnea cough yellow sputum.  At the time of my evaluation, patient appears to be very sleepy on BiPAP.  Physical examination reveals obese female, somnolent, mildly responsive when awakened, on BiPAP.  Laboratory data and imaging was reviewed  Assessment and Plan.   Acute respiratory failure with hypoxia likely secondary to influenza. Patient was very hypoxic on presentation.  On BiPAP 60% FiO2.  Has history of COPD.  Continue BiPAP, albuterol inhaler.  Add Solu-Medrol.  Spoke with RT at bedside.  Hypothyroidism -Continue synthroid   Major depression -Continue prozac   Influenza A Continue tamiflu   Abnormal EKG -Likely depressions are related to hypoxia, troponin negative.  No chest pain at this time.   COPD exacerbation ([place]) Initial VBG with pH of 7.1, pCO2 of 62.  Continue Solu-Medrol, magnesium sulfate, epinephrine, DuoNeb in the ED.  Continue steroids.  Continue frequent nebulizers.  Chest x-ray without any acute infiltrate.  Continue BiPAP as tolerated for now.  If no improvement might need ABG.    Gastroesophageal reflux disease -Continue protonix    No Charge  Signed,  Delila Pereyra, MD [facility]

## 2022-02-08 NOTE — ED Notes (Signed)
Patient removed bipap mask and O2 saturation dropped in the 50s. Patient was informed she needed to keep the mask on and the risk of removing it.

## 2022-02-08 NOTE — Assessment & Plan Note (Signed)
Continue synthroid.

## 2022-02-08 NOTE — ED Notes (Signed)
Patient removed bipap mask and was sitting on her knees in the bed.

## 2022-02-08 NOTE — Assessment & Plan Note (Signed)
Continue prozac. 

## 2022-02-08 NOTE — Assessment & Plan Note (Signed)
-  VBG = pH 7.1, PCO2 66 -Solumedrol, mag, epi, duoneb in ED -Continue steroids, scheduled and PRN breathing treatments -triggered by flu A -CXR = no active disease -Continue home meds -Monitor clinical response

## 2022-02-08 NOTE — Assessment & Plan Note (Signed)
-  Likely depressions are related to hypoxia and rate -Trop 4 -chest pain free -Monitor on tele

## 2022-02-08 NOTE — Progress Notes (Signed)
Patient was noncompliant with the BiPAP for a lot of the night.  She was given morphine and Ativan for anxiety so that she could keep the mask on, which was not successful.  She was then given Haldol and able to keep the mask on.  We explained to her that she is going to have to be intubated if she does not keep the BiPAP on.  She was resting comfortably with the BiPAP when I last saw her.  However her VBG is worse this a.m.  She may require intubation before the end of the day.

## 2022-02-08 NOTE — Progress Notes (Signed)
Patient transported from ED to ICU without any complications. 

## 2022-02-08 NOTE — Assessment & Plan Note (Addendum)
Continue tamiflu and supportive care

## 2022-02-08 NOTE — Progress Notes (Signed)
  Transition of Care (TOC) Screening Note   Patient Details  Name: [name] Date of Birth: March 28, 1975   Transition of Care [facility]) CM/SW Contact:    Iona Beard, [place] Phone Number: 02/08/2022, 11:02 AM   Transition of Care Department [facility]) has reviewed patient and no TOC needs have been identified at this time. We will continue to monitor patient advancement through interdisciplinary progression rounds. If new patient transition needs arise, please place a TOC consult.

## 2022-02-08 NOTE — H&P (Signed)
History and Physical    Patient: [name] FMB:[id number] DOB: 1975/12/17 DOA: 02/07/2022 DOS: the patient was seen and examined on 02/08/2022 PCP: Eugenia Pancoast, FNP  Patient coming from: Home  Chief Complaint:  Chief Complaint  Patient presents with   Shortness of Breath   HPI: [name] is a 46 y.o. female with medical history significant of abuse, COPD, depression, hepatitis C, tobacco use disorder, and more presents the ED with a chief complaint of dyspnea.  Patient reports that dyspnea and cough started 2 days ago.  Her cough is productive of yellow sputum.  She has had a fever as high as 102.  She took Tylenol for the fever, which helped.  She has been using her albuterol and DuoNeb at home, but they are offering no relief.  Patient has had associated chest pain.  She reports that that started today.  It woke her up out of sleep this morning.  She describes it as a throbbing pain.  It does not radiate.  It is resolved at this time.  Patient reports that she has a headache that she associates with this as well.  Head hurts across her forehead.  She does not have a history of migraines.  She was given Toradol and had no relief.  Headache is described as a pressure.  Patient reports that she is feeling better on BiPAP, but not back to baseline.  She also has some anxiety with the BiPAP and will require anxiolytics to continue treatment.  Patient has no other complaints at this time.  Patient quit smoking 6 months ago.  She does not drink alcohol per her report.  She does not use illicit drugs.  Patient is not vaccinated for COVID.  Patient is full code. Review of Systems: As mentioned in the history of present illness. All other systems reviewed and are negative. Past Medical History:  Diagnosis Date   Alcohol abuse    Anemia 01/31/2011   Asthma    COPD (chronic obstructive pulmonary disease) (HCC)    Depression    Hepatitis C 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 10/24/2021   RNA  negative.  Hep C false positive.     Hypoxia 02/01/2011   Medical history non-contributory    Needs sleep apnea assessment 2013.07.31   Tobacco abuse    Past Surgical History:  Procedure Laterality Date   ANKLE GANGLION CYST EXCISION     BACK SURGERY     CESAREAN SECTION     COLONOSCOPY WITH PROPOFOL N/A 06/27/2016   Procedure: COLONOSCOPY WITH PROPOFOL;  Surgeon: Jonathon Bellows, MD;  Location: [facility] ENDOSCOPY;  Service: Endoscopy;  Laterality: N/A;   DILATION AND CURETTAGE OF UTERUS     ESOPHAGOGASTRODUODENOSCOPY (EGD) WITH PROPOFOL N/A 06/27/2016   Procedure: ESOPHAGOGASTRODUODENOSCOPY (EGD) WITH PROPOFOL;  Surgeon: Jonathon Bellows, MD;  Location: [facility] ENDOSCOPY;  Service: Endoscopy;  Laterality: N/A;   Social History:  reports that she quit smoking about 2 months ago. Her smoking use included cigarettes. She has a 12.50 pack-year smoking history. She has never used smokeless tobacco. She reports current alcohol use. She reports that she does not currently use drugs after having used the following drugs: Marijuana and Cocaine.  No Known Allergies  Family History  Problem Relation Age of Onset   Stroke Mother    Hypothyroidism Mother    Coronary artery disease Mother    Throat cancer Mother    Stroke Maternal Grandmother    Diabetes Maternal Grandmother    COPD Paternal Grandmother  Prior to Admission medications   Medication Sig Start Date End Date Taking? Authorizing Provider  albuterol (PROVENTIL) (2.5 MG/3ML) 0.083% nebulizer solution Take 3 mLs (2.5 mg total) by nebulization every 6 (six) hours as needed for wheezing or shortness of breath. For asthma 12/11/20  Yes Kathie Dike, MD  albuterol (VENTOLIN HFA) 108 (90 Base) MCG/ACT inhaler INHALE 1 TO 2 PUFFS BY MOUTH EVERY 6 HOURS AS NEEDED FOR WHEEZING OR SHORTNESS OF BREATH 12/16/21  Yes Martyn Ehrich, NP  ALPRAZolam (XANAX) 0.25 MG tablet TAKE 1 TABLET BY MOUTH TWICE DAILY AS NEEDED FOR ANXIETY OR SLEEP Patient taking  differently: Take 0.25 mg by mouth 2 (two) times daily as needed for anxiety or sleep. 01/18/22  Yes Dugal, Lawerance Bach, FNP  budesonide-formoterol (SYMBICORT) 80-4.5 MCG/ACT inhaler Inhale 2 puffs into the lungs 2 (two) times daily. 04/27/21  Yes [provider]  FLUoxetine (PROZAC) 40 MG capsule Take 1 capsule (40 mg total) by mouth daily. 10/24/21 04/22/22 Yes Dugal, Lawerance Bach, FNP  gabapentin (NEURONTIN) 300 MG capsule Take 1 capsule (300 mg total) by mouth 4 (four) times daily. Patient taking differently: Take 300 mg by mouth daily as needed (pain). 10/24/21 02/07/22 Yes Dugal, [name], FNP  ipratropium-albuterol (DUONEB) 0.5-2.5 (3) MG/3ML SOLN Take 3 mLs by nebulization every 4 (four) hours as needed (shortness of  breath). 11/16/21  Yes [provider]  levothyroxine (SYNTHROID) 150 MCG tablet Take 175 mg tablet daily on Saturday and Sunday every week, take 150 mg tablet once daily Monday through Friday every week and repeat therafter 12/01/21  Yes Eugenia Pancoast, FNP  levothyroxine (SYNTHROID) 175 MCG tablet Take 175 mg tablet daily on Saturday and Sunday every week, take 150 mg tablet once daily Monday through Friday every week and repeat therafter 11/23/21  Yes Dugal, [name], FNP  montelukast (SINGULAIR) 10 MG tablet Take 1 tablet (10 mg total) by mouth at bedtime. 10/24/21  Yes Dugal, Lawerance Bach, FNP  pantoprazole (PROTONIX) 40 MG tablet Take 1 tablet (40 mg total) by mouth daily. 10/24/21  Yes Dugal, Lawerance Bach, FNP  Tiotropium Bromide Monohydrate (SPIRIVA RESPIMAT) 2.5 MCG/ACT AERS Inhale 2 puffs into the lungs daily. 12/15/21  Yes Martyn Ehrich, NP    Physical Exam: Vitals:   02/07/22 2200 02/07/22 2230 02/08/22 0113 02/08/22 0130  BP: 126/64 116/70  (!) 120/90  Pulse: (!) 108 (!) 104  (!) 111  Resp:  (!) 22  14  Temp:      TempSrc:      SpO2: 95% 95% 92% 100%   1.  General: Patient lying supine in bed,  no acute distress   2. Psychiatric: Alert and oriented x 3, mood is  anxious and behavior normal for situation, pleasant and cooperative with exam   3. Neurologic: Speech and language are normal, face is symmetric, moves all 4 extremities voluntarily, at baseline without acute deficits on limited exam   4. HEENMT:  Head is atraumatic, normocephalic, pupils reactive to light, neck is supple, trachea is midline, mucous membranes are moist   5. Respiratory : mild wheezing, no rhonchi, no rales, no cyanosis, no increase in work of breathing or accessory muscle use, BiPAP in place   6. Cardiovascular : Heart rate tachycardic, rhythm is regular, no murmurs, rubs or gallops, no peripheral edema, peripheral pulses palpated   7. Gastrointestinal:  Abdomen is soft, nondistended, nontender to palpation bowel sounds active, no masses or organomegaly palpated   8. Skin:  Skin is warm, dry and intact without rashes, acute  lesions, or ulcers on limited exam   9.Musculoskeletal:  No acute deformities or trauma, no asymmetry in tone, no peripheral edema, peripheral pulses palpated, no tenderness to palpation in the extremities  Data Reviewed: In the ED Temp 100.3, heart rate 121-130, respiratory rate 20-27, blood pressure 101/53-113/75, satting normally on BiPAP at 60% FiO2 No leukocytosis with a white blood cell count of 7.3, hemoglobin 10.9 Chemistry is unremarkable BNP 59 Lactic acid 1.1 Positive flu A Blood cultures pending Chest x-ray shows no active disease EKG shows heart rate of 130, sinus tach, QTc 450 with minimal depressions Will repeat EKG when heart rate is controlled Trope 4 Admission requested for management of COPD exacerbation secondary to flu a  Assessment and Plan: Hypothyroidism -Continue synthroid  Major depression -Continue prozac  Influenza A Continue tamiflu and supportive care  Abnormal EKG -Likely depressions are related to hypoxia and rate -Trop 4 -chest pain free -Monitor on tele  Acute respiratory failure with hypoxia  (HCC) -O2 sats down to 82% -Currently on BiPAP 60% FiO2 -Continue to treat COPD, and continue BiPAP -minimal depressions on EKG with trop 4 - depressions likely related to hypoxia and rate -Monitor on tele -Continue to monitor  COPD exacerbation (HCC) -VBG = pH 7.1, PCO2 66 -Solumedrol, mag, epi, duoneb in ED -Continue steroids, scheduled and PRN breathing treatments -triggered by flu A -CXR = no active disease -Continue home meds -Monitor clinical response  Gastroesophageal reflux disease -Continue protonix      Advance Care Planning:   Code Status: Full Code   Consults: None  Family Communication: No family at bedside  Severity of Illness: The appropriate patient status for this patient is INPATIENT. Inpatient status is judged to be reasonable and necessary in order to provide the required intensity of service to ensure the patient's safety. The patient's presenting symptoms, physical exam findings, and initial radiographic and laboratory data in the context of their chronic comorbidities is felt to place them at high risk for further clinical deterioration. Furthermore, it is not anticipated that the patient will be medically stable for discharge from the hospital within 2 midnights of admission.   * I certify that at the point of admission it is my clinical judgment that the patient will require inpatient hospital care spanning beyond 2 midnights from the point of admission due to high intensity of service, high risk for further deterioration and high frequency of surveillance required.*  Author: Rolla Plate, DO 02/08/2022 1:43 AM  For on call review [email or web address].

## 2022-02-08 NOTE — Assessment & Plan Note (Signed)
Continue protonix  

## 2022-02-08 NOTE — ED Notes (Signed)
Pt called out, had taken BIPAP off, BIPAP reapplied, O2 sat 74% and increased to 96% once BIPAP back in place, Pt reinstructed on keeping Bipap on.

## 2022-02-09 LAB — EXPECTORATED SPUTUM ASSESSMENT W GRAM STAIN, RFLX TO RESP C

## 2022-02-09 LAB — BASIC METABOLIC PANEL
Anion gap: 7 (ref 5–15)
BUN: 28 mg/dL — ABNORMAL HIGH (ref 6–20)
CO2: 25 mmol/L (ref 22–32)
Calcium: 8.2 mg/dL — ABNORMAL LOW (ref 8.9–10.3)
Chloride: 107 mmol/L (ref 98–111)
Creatinine, Ser: 0.81 mg/dL (ref 0.44–1.00)
GFR, Estimated: >60 mL/min (ref >60)
Glucose, Bld: 101 mg/dL — ABNORMAL HIGH (ref 70–99)
Potassium: 4.4 mmol/L (ref 3.5–5.1)
Sodium: 139 mmol/L (ref 135–145)

## 2022-02-09 LAB — MAGNESIUM: Magnesium: 2.5 mg/dL — ABNORMAL HIGH (ref 1.7–2.4)

## 2022-02-09 NOTE — Progress Notes (Signed)
VSS thoughout night and she remained in NSR on cardiac monitor. She c/o headache and sore throat. Dose of IV Morphine administered. Mouth care was performed x 2 and ice chips given. Around 0600 she requested to have a break from BiPAP. With advice from Respiratory she was placed on 5 L [place] and has tolerated it very well with SPO2 between 96-99%. She continues to c/o headache that she rates 8/10 and sharp in character. This RN administered PRN Tylenol and Ativan. No acute events over night. Bryson Corona Edd Fabian

## 2022-02-09 NOTE — Progress Notes (Signed)
Pt wanted break from BiPAP. Respiratory was consulted and advised to place patient on 4-5 L [place]. This RN placed patient on 5 L [place]. Kari Corona Edd Fabian

## 2022-02-09 NOTE — Progress Notes (Signed)
PROGRESS NOTE    [name]  ZYS:[id number] DOB: [birth year] DOA: 02/07/2022 PCP: Eugenia Pancoast, FNP    Brief Narrative:  [name] is a 46 y.o. female with medical history significant of abuse, COPD, depression, hepatitis C, tobacco use disorder, presented to the hospital with dyspnea, cough with productive yellow sputum and fever for 2 days.  She took albuterol and DuoNeb at home without much relief.  Patient complained of pain in the chest and headache as well..  Past history of smoker quit 6 months ago.  Patient tested positive for influenza A.  She was then admitted hospital for further evaluation and treatment.   Assessment and Plan:  Acute respiratory failure with hypoxia likely secondary to influenza. Patient was very hypoxic on presentation.  On BiPAP.  Has history of COPD.  Continue BiPAP, albuterol inhaler.  Continue steroid.  Has required BiPAP throughout the night and today.  Still appears to be wheezing with dyspnea and shortness of breath.  Likely flui exacerbating her COPD at this time.  Continue Tamiflu.  Will consider pulmonary evaluation if patient does not improve in the next day or so   Hypothyroidism -Continue synthroid   Major depression -Continue prozac   Influenza A Continue tamiflu   Abnormal EKG Likely depressions are related to hypoxia, troponin negative.  No chest pain at this time.   COPD exacerbation ([place]) Initial VBG with pH of 7.1, pCO2 of 62.  Continue Solu-Medrol, magnesium sulfate, epinephrine, DuoNeb in the ED.  Continue steroids.  Continue frequent nebulizers.  Chest x-ray without any acute infiltrate.  Continue BiPAP as tolerated for now.  Patient does have oxygen machine at home as per the patient's sister.    Gastroesophageal reflux disease -Continue protonix   DVT prophylaxis: heparin injection 5,000 Units Start: 02/07/22 2200 SCDs Start: 02/07/22 2016   Code Status:     Code Status: Full Code  Disposition:  Home  Status is: Inpatient  Remains inpatient appropriate because: Persistent respiratory failure, BiPAP, dyspnea   Family Communication: Spoke with the patient's sister on the phone and updated her about the clinical condition of the patient.  Consultants:  Currently  Procedures:  BiPAP  Antimicrobials:  Tamiflu  Anti-infectives (From admission, onward)    Start     Dose/Rate Route Frequency Ordered Stop   02/07/22 2200  oseltamivir (TAMIFLU) capsule 75 mg        75 mg Oral 2 times daily 02/07/22 1932 02/12/22 2159       Subjective: Today, patient was seen and examined at bedside.  Patient still complains of shortness of breath, wheezing, dyspnea.  Objective: Vitals:   02/09/22 0755 02/09/22 0758 02/09/22 1105 02/09/22 1300  BP:      Pulse:      Resp:      Temp:   98.2 F (36.8 C)   TempSrc:   Oral   SpO2: 97% 99%  96%  Weight:      Height:        Intake/Output Summary (Last 24 hours) at 02/09/2022 1414 Last data filed at 02/09/2022 1148 Gross per 24 hour  Intake 360 ml  Output 1300 ml  Net -940 ml   Filed Weights   02/08/22 1150  Weight: 94.3 kg    Physical Examination: Body mass index is 34.6 kg/m.  General: Obese built, not in obvious distress, on 5 L [place] HENT:   No scleral pallor or icterus noted. Oral mucosa is moist.  Chest:   Diminished breath sounds  bilaterally.  Has expiratory wheezing with prolonged expiration..  CVS: S1 &S2 heard. No murmur.  Regular rate and rhythm. Abdomen: Soft, nontender, nondistended.  Bowel sounds are heard.   Extremities: No cyanosis, clubbing or edema.  Peripheral pulses are palpable. Psych: Alert, awake and oriented, normal mood CNS:  No cranial nerve deficits.  Power equal in all extremities.   Skin: Warm and dry.  No rashes noted.  Data Reviewed:   CBC: Recent Labs  Lab 02/07/22 1715 02/08/22 1002  WBC 7.3 12.4*  NEUTROABS 6.0  --   HGB 10.9* 9.9*  HCT 36.1 35.7*  MCV 79.0* 86.0  PLT 263 237     Basic Metabolic Panel: Recent Labs  Lab 02/07/22 1715 02/08/22 0452 02/09/22 0457  NA 134* 139 139  K 3.7 4.8 4.4  CL 102 106 107  CO2 19* 24 25  GLUCOSE 95 137* 101*  BUN 10 19 28*  CREATININE 0.75 1.01* 0.81  CALCIUM 8.7* 8.4* 8.2*  MG  --  2.2 2.5*    Liver Function Tests: Recent Labs  Lab 02/08/22 0452  AST 39  ALT 22  ALKPHOS 62  BILITOT 0.4  PROT 7.6  ALBUMIN 4.2     Radiology Studies: DG Chest Port 1 View  Result Date: 02/07/2022 CLINICAL DATA:  Shortness of breath EXAM: PORTABLE CHEST 1 VIEW COMPARISON:  December 16, 2021 FINDINGS: The heart size and mediastinal contours are within normal limits. Both lungs are clear. The visualized skeletal structures are unremarkable. IMPRESSION: No active disease. Electronically Signed   By: Dorise Bullion III [name]   On: 02/07/2022 17:35      LOS: 2 days   Flora Lipps, MD [facility] Available via Epic secure chat 7am-7pm After these hours, please refer to coverage provider listed on [email or web address] 02/09/2022, 2:14 PM

## 2022-02-10 LAB — URINALYSIS, COMPLETE (UACMP) WITH MICROSCOPIC
Bilirubin Urine: NEGATIVE
Glucose, UA: NEGATIVE mg/dL
Ketones, ur: 20 mg/dL — AB
Nitrite: NEGATIVE
Protein, ur: 100 mg/dL — AB
RBC / HPF: >50 RBC/hpf — ABNORMAL HIGH (ref 0–5)
Specific Gravity, Urine: 1.024 (ref 1.005–1.030)
pH: 6 (ref 5.0–8.0)

## 2022-02-10 LAB — CBC
HCT: 33.1 % — ABNORMAL LOW (ref 36.0–46.0)
Hemoglobin: 9.6 g/dL — ABNORMAL LOW (ref 12.0–15.0)
MCH: 23.6 pg — ABNORMAL LOW (ref 26.0–34.0)
MCHC: 29 g/dL — ABNORMAL LOW (ref 30.0–36.0)
MCV: 81.3 fL (ref 80.0–100.0)
Platelets: 179 10*3/uL (ref 150–400)
RBC: 4.07 MIL/uL (ref 3.87–5.11)
RDW: 15.9 % — ABNORMAL HIGH (ref 11.5–15.5)
WBC: 4.7 10*3/uL (ref 4.0–10.5)
nRBC: 0 % (ref 0.0–0.2)

## 2022-02-10 LAB — BLOOD GAS, VENOUS
Acid-Base Excess: 5.5 mmol/L — ABNORMAL HIGH (ref 0.0–2.0)
Bicarbonate: 31.1 mmol/L — ABNORMAL HIGH (ref 20.0–28.0)
Drawn by: 1578
O2 Saturation: 57.9 %
Patient temperature: 36.7
pCO2, Ven: 47 mmHg (ref 44–60)
pH, Ven: 7.42 (ref 7.25–7.43)
pO2, Ven: <31 mmHg — CL (ref 32–45)

## 2022-02-10 MED ORDER — PHENOL 1.4 % MT LIQD
1.0000 | OROMUCOSAL | Status: DC | PRN
  Administered 2022-02-12: 1 via OROMUCOSAL
  Filled 2022-02-10: qty 177

## 2022-02-10 MED ORDER — SODIUM CHLORIDE 3 % IN NEBU
4.0000 mL | INHALATION_SOLUTION | Freq: Two times a day (BID) | RESPIRATORY_TRACT | Status: AC
  Administered 2022-02-10 – 2022-02-12 (×4): 4 mL via RESPIRATORY_TRACT
  Filled 2022-02-10 (×4): qty 4

## 2022-02-10 MED ORDER — GUAIFENESIN 100 MG/5ML PO LIQD
5.0000 mL | ORAL | Status: DC | PRN
  Administered 2022-02-10 – 2022-02-13 (×9): 5 mL via ORAL
  Filled 2022-02-10 (×9): qty 5

## 2022-02-10 NOTE — Progress Notes (Signed)
Patient states she doesn't want to go back on BIPAP tonight. She stated that it makes her too anxious and dries her mouth too much. Explained risks to her and told her blood work was ordered for the AM to check her pH and CO2 levels. Patient agreeable and states if she gets in distress she would be willing to go back on. She states she doesn't wear one at home currently but has been told she probably should. At this time one hasn't been ordered for her at home per pt.

## 2022-02-10 NOTE — TOC Progression Note (Signed)
Transition of Care [facility]) - Progression Note    Patient Details  Name: LAJUANNA POMPA MRN: [id number] Date of Birth: 08/01/1975  Transition of Care [facility]) CM/SW Contact  Boneta Lucks, RN Phone Number: 02/10/2022, 2:26 PM  Clinical Narrative:   DC planning, Patient still needs a couple days. [facility] has no PCP and medicaid potential.  Agreeable to [facility] program, Referral sent today. They will reopen on 02/14/22.    Expected Discharge Plan: Home/Self Care Barriers to Discharge: Continued Medical Work up  Expected Discharge Plan and [place] Determinants of Health (SDOH) Interventions [place]: No Food Insecurity (02/08/2022)  Housing: Low Risk  (02/08/2022)  Transportation Needs: No Transportation Needs (02/08/2022)  Utilities: Not At Risk (02/08/2022)  Depression (PHQ2-9): Low Risk  (11/04/2021)  Tobacco Use: Medium Risk (02/07/2022)    Readmission Risk Interventions     No data to display

## 2022-02-10 NOTE — Progress Notes (Addendum)
PROGRESS NOTE    COLLENE MASSIMINO  KZS:[id number] DOB: 04/15/1975 DOA: 02/07/2022 PCP: Eugenia Pancoast, FNP    Brief Narrative:   [name] is a 46 y.o. female with medical history significant of abuse, COPD, depression, hepatitis C, tobacco use disorder, presented to the hospital with dyspnea, cough with productive yellow sputum and fever for 2 days.  She took albuterol and DuoNeb at home without much relief.  Patient complained of pain in the chest and headache as well..  Past history of smoker quit 6 months ago.  Patient tested positive for influenza A.  She was then admitted hospital for further evaluation and treatment.   Assessment and Plan:  Acute respiratory failure with hypoxia likely secondary to influenza. Patient was very hypoxic on presentation.  Has been requiring BiPAP especially in the nighttime and required 3 hours yesterday.  Requesting to be on nasal cannula.  Will continue with albuterol inhaler, steroids. Likely flu exacerbating her COPD at this time.  Continue Tamiflu.  Add incentive spirometry.  Patient states that she has thick secretions.  Add mucolytic's, hypertonic saline nebulizer.  Sputum culture showed gram-positive cocci.   Hypothyroidism -Continue synthroid   Major depression -Continue prozac   Influenza A Continue tamiflu   Abnormal EKG Likely depressions are related to hypoxia, troponin negative.  No chest pain at this time.   COPD exacerbation ([place]) Initial VBG with pH of 7.1, pCO2 of 62.  Continue Solu-Medrol, magnesium sulfate, epinephrine, DuoNeb in the ED.  Continue steroids.  Continue frequent nebulizers.  Add incentive spirometry, mucolytic's and saline nebs and due to thick secretion.  Chest x-ray without any acute infiltrate.  Continue to wean BiPAP as able.  ABG this morning showed hypoxia without CO2 retention.  Had received 3 hours of BiPAP yesterday night.    Gastroesophageal reflux disease -Continue protonix   DVT  prophylaxis: heparin injection 5,000 Units Start: 02/07/22 2200 SCDs Start: 02/07/22 2016   Code Status:     Code Status: Full Code  Disposition: Home  Status is: Inpatient  Remains inpatient appropriate because: Persistent respiratory failure, BiPAP, dyspnea   Family Communication:  Spoke with the patient's sister on the phone and updated her about the clinical condition of the patient on 10/25/2021.  Consultants:  None  Procedures:  BiPAP  Antimicrobials:  Tamiflu  Anti-infectives (From admission, onward)    Start     Dose/Rate Route Frequency Ordered Stop   02/07/22 2200  oseltamivir (TAMIFLU) capsule 75 mg        75 mg Oral 2 times daily 02/07/22 1932 02/12/22 2159      Subjective: Today, patient was seen and examined at bedside.  Complains of shortness of breath, thick secretions unable to expectorate, was on BiPAP for 3 hours yesterday and was put on nasal cannula oxygen this morning.  Objective: Vitals:   02/10/22 0800 02/10/22 1000 02/10/22 1100 02/10/22 1200  BP: (!) 115/57 (!) 115/55 (!) 105/53 112/67  Pulse: 76 86 85 79  Resp: '17 14 13 13  '$ Temp: 97.8 F (36.6 C)     TempSrc: Oral     SpO2: 95% 93% 97% 95%  Weight:      Height:        Intake/Output Summary (Last 24 hours) at 02/10/2022 1336 Last data filed at 02/10/2022 0858 Gross per 24 hour  Intake 200 ml  Output 1250 ml  Net -1050 ml    Filed Weights   02/08/22 1150 02/10/22 0253  Weight: 94.3 kg 93.2  kg    Physical Examination: Body mass index is 34.19 kg/m.   General: Obese built, not in obvious distress, currently on 5 L of nasal cannula oxygen. HENT:   No scleral pallor or icterus noted. Oral mucosa is moist.  Chest: Diminished breath sounds bilaterally, prolonged expiration, CVS: S1 &S2 heard. No murmur.  Regular rate and rhythm. Abdomen: Soft, nontender, nondistended.  Bowel sounds are heard.   Extremities: No cyanosis, clubbing or edema.  Peripheral pulses are  palpable. Psych: Alert, awake and oriented, anxious, CNS:  No cranial nerve deficits.  Power equal in all extremities.   Skin: Warm and dry.  No rashes noted.  Data Reviewed:   CBC: Recent Labs  Lab 02/07/22 1715 02/08/22 1002  WBC 7.3 12.4*  NEUTROABS 6.0  --   HGB 10.9* 9.9*  HCT 36.1 35.7*  MCV 79.0* 86.0  PLT 263 237     Basic Metabolic Panel: Recent Labs  Lab 02/07/22 1715 02/08/22 0452 02/09/22 0457  NA 134* 139 139  K 3.7 4.8 4.4  CL 102 106 107  CO2 19* 24 25  GLUCOSE 95 137* 101*  BUN 10 19 28*  CREATININE 0.75 1.01* 0.81  CALCIUM 8.7* 8.4* 8.2*  MG  --  2.2 2.5*     Liver Function Tests: Recent Labs  Lab 02/08/22 0452  AST 39  ALT 22  ALKPHOS 62  BILITOT 0.4  PROT 7.6  ALBUMIN 4.2      Radiology Studies: No results found.    LOS: 3 days   Flora Lipps, MD [facility] Available via Epic secure chat 7am-7pm After these hours, please refer to coverage provider listed on [email or web address] 02/10/2022, 1:36 PM

## 2022-02-10 NOTE — Progress Notes (Signed)
VBG ordered for AM per RT protocol since patient hasn't had one since 12/27 and she is being non compliant with BIPAP at night.

## 2022-02-11 LAB — BASIC METABOLIC PANEL
Anion gap: 6 (ref 5–15)
BUN: 16 mg/dL (ref 6–20)
CO2: 29 mmol/L (ref 22–32)
Calcium: 8.6 mg/dL — ABNORMAL LOW (ref 8.9–10.3)
Chloride: 105 mmol/L (ref 98–111)
Creatinine, Ser: 0.57 mg/dL (ref 0.44–1.00)
GFR, Estimated: >60 mL/min (ref >60)
Glucose, Bld: 82 mg/dL (ref 70–99)
Potassium: 3.5 mmol/L (ref 3.5–5.1)
Sodium: 140 mmol/L (ref 135–145)

## 2022-02-11 LAB — CULTURE, RESPIRATORY W GRAM STAIN: Culture: NORMAL

## 2022-02-11 LAB — CBC
HCT: 34 % — ABNORMAL LOW (ref 36.0–46.0)
Hemoglobin: 10 g/dL — ABNORMAL LOW (ref 12.0–15.0)
MCH: 23.9 pg — ABNORMAL LOW (ref 26.0–34.0)
MCHC: 29.4 g/dL — ABNORMAL LOW (ref 30.0–36.0)
MCV: 81.1 fL (ref 80.0–100.0)
Platelets: 173 10*3/uL (ref 150–400)
RBC: 4.19 MIL/uL (ref 3.87–5.11)
RDW: 16 % — ABNORMAL HIGH (ref 11.5–15.5)
WBC: 3.9 10*3/uL — ABNORMAL LOW (ref 4.0–10.5)
nRBC: 0 % (ref 0.0–0.2)

## 2022-02-11 LAB — MAGNESIUM: Magnesium: 2.2 mg/dL (ref 1.7–2.4)

## 2022-02-11 MED ORDER — AMOXICILLIN-POT CLAVULANATE 875-125 MG PO TABS
1.0000 | ORAL_TABLET | Freq: Two times a day (BID) | ORAL | Status: DC
  Administered 2022-02-11 – 2022-02-13 (×5): 1 via ORAL
  Filled 2022-02-11 (×5): qty 1

## 2022-02-11 MED ORDER — CHLORHEXIDINE GLUCONATE CLOTH 2 % EX PADS
6.0000 | MEDICATED_PAD | Freq: Every day | CUTANEOUS | Status: DC
  Administered 2022-02-12: 6 via TOPICAL
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
## 2022-02-11 NOTE — Plan of Care (Signed)
  Problem: Education: Goal: Knowledge of disease or condition will improve Outcome: Progressing   Problem: Education: Goal: Knowledge of General Education information will improve Description: Including pain rating scale, medication(s)/side effects and non-pharmacologic comfort measures Outcome: Progressing

## 2022-02-11 NOTE — Plan of Care (Signed)
  Problem: Acute Rehab PT Goals(only PT should resolve) Goal: Pt Will Go Supine/Side To Sit Flowsheets (Taken 02/11/2022 1222) Pt will go Supine/Side to Sit: Independently Goal: Pt Will Go Sit To Supine/Side Flowsheets (Taken 02/11/2022 1222) Pt will go Sit to Supine/Side: Independently Goal: Patient Will Transfer Sit To/From Stand Flowsheets (Taken 02/11/2022 1222) Patient will transfer sit to/from stand: Independently Goal: Pt Will Ambulate Flowsheets (Taken 02/11/2022 1222) Pt will Ambulate:  75 feet  Independently
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
## 2022-02-12 LAB — CULTURE, BLOOD (ROUTINE X 2)
Culture: NO GROWTH
Culture: NO GROWTH
Special Requests: ADEQUATE

## 2022-02-12 LAB — BASIC METABOLIC PANEL
Anion gap: 6 (ref 5–15)
BUN: 14 mg/dL (ref 6–20)
CO2: 30 mmol/L (ref 22–32)
Calcium: 8.7 mg/dL — ABNORMAL LOW (ref 8.9–10.3)
Chloride: 102 mmol/L (ref 98–111)
Creatinine, Ser: 0.55 mg/dL (ref 0.44–1.00)
GFR, Estimated: >60 mL/min (ref >60)
Glucose, Bld: 82 mg/dL (ref 70–99)
Potassium: 3.3 mmol/L — ABNORMAL LOW (ref 3.5–5.1)
Sodium: 138 mmol/L (ref 135–145)

## 2022-02-12 MED ORDER — POTASSIUM CHLORIDE CRYS ER 20 MEQ PO TBCR
40.0000 meq | EXTENDED_RELEASE_TABLET | Freq: Once | ORAL | Status: AC
  Administered 2022-02-12: 40 meq via ORAL
  Filled 2022-02-12: qty 2
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
## 2022-02-13 MED ORDER — ALBUTEROL SULFATE HFA 108 (90 BASE) MCG/ACT IN AERS: INHALATION_SPRAY | RESPIRATORY_TRACT | 3 refills | Status: DC

## 2022-02-13 MED ORDER — AMOXICILLIN-POT CLAVULANATE 875-125 MG PO TABS: 1.0000 | ORAL_TABLET | Freq: Two times a day (BID) | ORAL | 0 refills | Status: AC

## 2022-02-13 MED ORDER — POTASSIUM CHLORIDE CRYS ER 20 MEQ PO TBCR
40.0000 meq | EXTENDED_RELEASE_TABLET | Freq: Once | ORAL | Status: AC
  Administered 2022-02-13: 40 meq via ORAL
  Filled 2022-02-13: qty 2

## 2022-02-13 MED ORDER — ORAL CARE MOUTH RINSE: 15.0000 mL | OROMUCOSAL | Status: DC | PRN

## 2022-02-13 MED ORDER — POTASSIUM CHLORIDE CRYS ER 20 MEQ PO TBCR: 20.0000 meq | EXTENDED_RELEASE_TABLET | Freq: Every day | ORAL | 0 refills | Status: DC

## 2022-02-13 MED ORDER — GUAIFENESIN 100 MG/5ML PO LIQD: 5.0000 mL | ORAL | 0 refills | Status: DC | PRN

## 2022-02-13 MED ORDER — GABAPENTIN 300 MG PO CAPS: 300.0000 mg | ORAL_CAPSULE | Freq: Every day | ORAL | 0 refills | Status: DC | PRN

## 2022-02-13 MED ORDER — LEVOTHYROXINE SODIUM 75 MCG PO TABS
175.0000 ug | ORAL_TABLET | Freq: Every day | ORAL | Status: DC
  Filled 2022-02-13: qty 1
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
## 2022-02-14 ENCOUNTER — Telehealth: Payer: Self-pay

## 2022-02-14 NOTE — Telephone Encounter (Signed)
Attempted to reach out to patient after receiving referral from [place] at [facility]. Patient is showing Medicaid potential and is not showing any active insurance. Patient needs a PCP. Patient's phone went straight to voicemail. I left a message on the patient's phone.

## 2022-02-14 NOTE — Telephone Encounter (Signed)
Transition Care Management Unsuccessful Follow-up Telephone Call  Date of discharge and from where:  [facility] 02/13/2022  Attempts:  1st Attempt  Reason for unsuccessful TCM follow-up call:  Left voice message Juanda Crumble, [place] Direct Dial [phone]

## 2022-02-15 NOTE — Telephone Encounter (Signed)
Transition Care Management Unsuccessful Follow-up Telephone Call  Date of discharge and from where:  [facility] 02/13/2022  Attempts:  2nd Attempt  Reason for unsuccessful TCM follow-up call:  No answer/busy Juanda Crumble, [place] Direct Dial [phone]

## 2022-02-16 NOTE — Telephone Encounter (Signed)
Transition Care Management Follow-up Telephone Call Date of discharge and from where: [facility] 02/13/2022 How have you been since you were released from the hospital? better Any questions or concerns? No  Items Reviewed: Did the pt receive and understand the discharge instructions provided? Yes  Medications obtained and verified? Yes  Other? No  Any new allergies since your discharge? No  Dietary orders reviewed? Yes Do you have support at home? Yes   Home Care and Equipment/Supplies: Were home health services ordered? no If so, what is the name of the agency? N/a  Has the agency set up a time to come to the patient's home? not applicable Were any new equipment or medical supplies ordered?  No What is the name of the medical supply agency? N/a Were you able to get the supplies/equipment? not applicable Do you have any questions related to the use of the equipment or supplies? No  Functional Questionnaire: (I = Independent and D = Dependent) ADLs: I  Bathing/Dressing- I  Meal Prep- I  Eating- I  Maintaining continence- I  Transferring/Ambulation- I  Managing Meds- I  Follow up appointments reviewed:  PCP Hospital f/u appt confirmed? Yes  Scheduled to see Eugenia Pancoast on 02/21/2022 @ 9:40. [place] f/u appt confirmed? No   Are transportation arrangements needed? No  If their condition worsens, is the pt aware to call PCP or go to the Emergency Dept.? Yes Was the patient provided with contact information for the PCP's office or ED? Yes Was to pt encouraged to call back with questions or concerns? Yes  Juanda Crumble, LPN [place] Direct Dial [phone]

## 2022-02-17 NOTE — Telephone Encounter (Signed)
Noted will d/w pt at visit 1/9

## 2022-02-21 ENCOUNTER — Encounter: Payer: Self-pay | Admitting: Family

## 2022-02-21 ENCOUNTER — Ambulatory Visit (INDEPENDENT_AMBULATORY_CARE_PROVIDER_SITE_OTHER)
Admission: RE | Admit: 2022-02-21 | Discharge: 2022-02-21 | Disposition: A | Discharge status: Home / Self Care | Payer: Self-pay | Source: Ambulatory Visit | Attending: Family | Admitting: Family

## 2022-02-21 ENCOUNTER — Ambulatory Visit (INDEPENDENT_AMBULATORY_CARE_PROVIDER_SITE_OTHER): Payer: Self-pay | Admitting: Family

## 2022-02-21 VITALS — BP 112/72 | HR 73 | Temp 97.1°F | Ht 64.0 in | Wt 216.0 lb

## 2022-02-21 DIAGNOSIS — J9601 Acute respiratory failure with hypoxia: Secondary | ICD-10-CM

## 2022-02-21 DIAGNOSIS — R0989 Other specified symptoms and signs involving the circulatory and respiratory systems: Secondary | ICD-10-CM

## 2022-02-21 DIAGNOSIS — E876 Hypokalemia: Secondary | ICD-10-CM

## 2022-02-21 DIAGNOSIS — R062 Wheezing: Secondary | ICD-10-CM

## 2022-02-21 DIAGNOSIS — Z87891 Personal history of nicotine dependence: Secondary | ICD-10-CM

## 2022-02-21 DIAGNOSIS — J441 Chronic obstructive pulmonary disease with (acute) exacerbation: Secondary | ICD-10-CM

## 2022-02-21 LAB — CBC
HCT: 32.8 % — ABNORMAL LOW (ref 36.0–46.0)
Hemoglobin: 10.2 g/dL — ABNORMAL LOW (ref 12.0–15.0)
MCHC: 31 g/dL (ref 30.0–36.0)
MCV: 75.9 fl — ABNORMAL LOW (ref 78.0–100.0)
Platelets: 375 10*3/uL (ref 150.0–400.0)
RBC: 4.32 Mil/uL (ref 3.87–5.11)
RDW: 16.9 % — ABNORMAL HIGH (ref 11.5–15.5)
WBC: 6.1 10*3/uL (ref 4.0–10.5)

## 2022-02-21 LAB — BASIC METABOLIC PANEL
BUN: 10 mg/dL (ref 6–23)
CO2: 27 mEq/L (ref 19–32)
Calcium: 8.8 mg/dL (ref 8.4–10.5)
Chloride: 106 mEq/L (ref 96–112)
Creatinine, Ser: 0.81 mg/dL (ref 0.40–1.20)
GFR: 86.39 mL/min (ref >60.00)
Glucose, Bld: 73 mg/dL (ref 70–99)
Potassium: 4.2 mEq/L (ref 3.5–5.1)
Sodium: 139 mEq/L (ref 135–145)

## 2022-02-21 MED ORDER — PREDNISONE 20 MG PO TABS: ORAL_TABLET | ORAL | 0 refills | Status: DC

## 2022-02-21 MED ORDER — ALPRAZOLAM 0.25 MG PO TABS: 0.2500 mg | ORAL_TABLET | Freq: Two times a day (BID) | ORAL | 0 refills | Status: DC | PRN

## 2022-02-21 NOTE — Progress Notes (Signed)
Please ask pt what was most recent antbx that she may have been given in hospital or in the last 30 days. Suggested bronchitis likely from post infection at the hospital. I want to send in antbx but want to make sure it isn't repeated.   Once I receive response I will send in antbx that I want her to start taking along with her prednisone.

## 2022-02-21 NOTE — Assessment & Plan Note (Signed)
Rx prednisone 40 mg once daily for five days  If sob worsens go to er and or call 911.  Nebulizer prn  Albuterol prn  Continue symbicort and spiriva

## 2022-02-21 NOTE — Assessment & Plan Note (Signed)
Smoking cessation instruction/counseling given:  commended patient for quitting and reviewed strategies for preventing relapses 

## 2022-02-21 NOTE — Assessment & Plan Note (Signed)
CXR ordered to r/o pneumonia as wheezing and decreased breath sounds

## 2022-02-21 NOTE — Assessment & Plan Note (Signed)
Resolved.  O2 sats stable.

## 2022-02-21 NOTE — Patient Instructions (Signed)
  Complete xray(s) prior to leaving today. I will notify you of your results once received.   Regards,   Eugenia Pancoast FNP-C

## 2022-02-21 NOTE — Progress Notes (Signed)
Established Patient Office Visit  Subjective:  Patient ID: [name], female    DOB: [birth year]  Age: 47 y.o. MRN: [id number]  CC:  Chief Complaint  Patient presents with   [facility] follow-up    HPI HOPIE Matthews is here for hospital follow up.   Hospital:  Admit: 02/07/2022, c/o sob x 3 days, ems was called. O2 with ems was in the 80's. Given o2 without improvement. Put her on cpap, given 2 duonebs mag and 2 doses epi and sats rose to 90.  Discharge: 02/13/22 Discharge dx: respiratory failure due to flu A and COPD, pt was put on bipap in hospital.  In hospital BMP 3.3 02/12/22 Bact cultures x 2 negative Anemia in hospital, cbc stable. Anemia stalbe.  Has f/u with pulmonary 03/06/22.  Lab Results  Component Value Date   WBC 3.9 (L) 02/11/2022   HGB 10.0 (L) 02/11/2022   HCT 34.0 (L) 02/11/2022   MCV 81.1 02/11/2022   PLT 173 25/36/6440  Last metabolic panel Lab Results  Component Value Date   GLUCOSE 82 02/12/2022   NA 138 02/12/2022   K 3.3 (L) 02/12/2022   CL 102 02/12/2022   CO2 30 02/12/2022   BUN 14 02/12/2022   CREATININE 0.55 02/12/2022   GFRNONAA >60 02/12/2022   CALCIUM 8.7 (L) 02/12/2022   PHOS 3.8 10/24/2019   PROT 7.6 02/08/2022   ALBUMIN 4.2 02/08/2022   BILITOT 0.4 02/08/2022   ALKPHOS 62 02/08/2022   AST 39 02/08/2022   ALT 22 02/08/2022   ANIONGAP 6 02/12/2022    Hypokalemia in hospital discharged with potassium 20 meq for 5 days. Also with low calcium.   Discharge medications: tiotropium bromide monohydrate 2.5 mcg /act 2 puffs inhaled daily spiriva and also taking symbicort 80-4.5 mg two puffs twice daily.    Acute concerns:  Feeling better since discharge, still feeling tired with some fatigue.  Breathing much improved. Doing breathing treatments at night and in am as this is wen sob is exacerbated.  Today's Vitals   02/21/22 0947  BP: 112/72  Pulse: 73  Temp: (!) 97.1 F (36.2 C)  TempSrc: Skin   SpO2: 99%  Weight: 216 lb (98 kg)  Height: '5\' 4"'$  (1.626 m)   Body mass index is 37.08 kg/m.    Past Medical History:  Diagnosis Date   Alcohol abuse    Anemia 01/31/2011   Asthma    COPD (chronic obstructive pulmonary disease) (HCC)    Depression    Hepatitis C 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 10/24/2021   RNA negative.  Hep C false positive.     Hypoxia 02/01/2011   Medical history non-contributory    Needs sleep apnea assessment 2013.07.31   Tobacco abuse     Past Surgical History:  Procedure Laterality Date   ANKLE GANGLION CYST EXCISION     BACK SURGERY     CESAREAN SECTION     COLONOSCOPY WITH PROPOFOL N/A 06/27/2016   Procedure: COLONOSCOPY WITH PROPOFOL;  Surgeon: Jonathon Bellows, MD;  Location: [facility] ENDOSCOPY;  Service: Endoscopy;  Laterality: N/A;   DILATION AND CURETTAGE OF UTERUS     ESOPHAGOGASTRODUODENOSCOPY (EGD) WITH PROPOFOL N/A 06/27/2016   Procedure: ESOPHAGOGASTRODUODENOSCOPY (EGD) WITH PROPOFOL;  Surgeon: Jonathon Bellows, MD;  Location: [facility] ENDOSCOPY;  Service: Endoscopy;  Laterality: N/A;    Family History  Problem Relation Age of Onset   Stroke Mother    Hypothyroidism Mother    Coronary artery disease Mother  Throat cancer Mother    Stroke Maternal Grandmother    Diabetes Maternal Grandmother    COPD Paternal Grandmother     Social History   Socioeconomic History   Marital status: Single    Spouse name: Not on file   Number of children: 1   Years of education: Not on file   Highest education level: Not on file  Occupational History    Employer: [employer]  Tobacco Use   Smoking status: Former    Packs/day: 0.50    Years: 25.00    Total pack years: 12.50    Types: Cigarettes    Quit date: 11/11/2021    Years since quitting: 0.2   Smokeless tobacco: Never   Tobacco comments:    Pt states she stopped smoking when she got out of hospital. ALS 10/20  Vaping Use   Vaping Use: Former  Substance and Sexual Activity   Alcohol use: Yes    Drug use: Not Currently    Types: Marijuana, Cocaine    Comment: last >2 y/o   Sexual activity: Not Currently    Partners: Female    Comment: Homosexual  Other Topics Concern   Not on file  Social History Narrative   Son, 56 y/o    Social Determinants of Health   Financial Resource Strain: Not on file  Food Insecurity: No Food Insecurity (02/08/2022)   Hunger Vital Sign    Worried About Running Out of Food in the Last Year: Never true    [place] in the Last Year: Never true  Transportation Needs: No Transportation Needs (02/08/2022)   PRAPARE - Hydrologist (Medical): No    Lack of Transportation (Non-Medical): No  Physical Activity: Not on file  Stress: Not on file  Social Connections: Not on file  Intimate Partner Violence: Not At Risk (02/08/2022)   Humiliation, Afraid, Rape, and Kick questionnaire    Fear of Current or Ex-Partner: No    Emotionally Abused: No    Physically Abused: No    Sexually Abused: No    Outpatient Medications Prior to Visit  Medication Sig Dispense Refill   albuterol (PROVENTIL) (2.5 MG/3ML) 0.083% nebulizer solution Take 3 mLs (2.5 mg total) by nebulization every 6 (six) hours as needed for wheezing or shortness of breath. For asthma 75 mL 1   albuterol (VENTOLIN HFA) 108 (90 Base) MCG/ACT inhaler INHALE 1 TO 2 PUFFS BY MOUTH EVERY 6 HOURS AS NEEDED FOR WHEEZING OR SHORTNESS OF BREATH 9 g 3   budesonide-formoterol (SYMBICORT) 80-4.5 MCG/ACT inhaler Inhale 2 puffs into the lungs 2 (two) times daily.     FLUoxetine (PROZAC) 40 MG capsule Take 1 capsule (40 mg total) by mouth daily. 90 capsule 1   gabapentin (NEURONTIN) 300 MG capsule Take 1 capsule (300 mg total) by mouth daily as needed (pain). 90 capsule 0   guaiFENesin (ROBITUSSIN) 100 MG/5ML liquid Take 5 mLs by mouth every 4 (four) hours as needed for cough or to loosen phlegm. 120 mL 0   ipratropium-albuterol (DUONEB) 0.5-2.5 (3) MG/3ML SOLN Take 3 mLs by  nebulization every 4 (four) hours as needed (shortness of  breath).     levothyroxine (SYNTHROID) 150 MCG tablet Take 175 mg tablet daily on Saturday and Sunday every week, take 150 mg tablet once daily Monday through Friday every week and repeat therafter 90 tablet 3   levothyroxine (SYNTHROID) 175 MCG tablet Take 175 mg tablet daily on Saturday and Sunday  every week, take 150 mg tablet once daily Monday through Friday every week and repeat therafter 30 tablet 1   montelukast (SINGULAIR) 10 MG tablet Take 1 tablet (10 mg total) by mouth at bedtime. 30 tablet 0   pantoprazole (PROTONIX) 40 MG tablet Take 1 tablet (40 mg total) by mouth daily. 90 tablet 3   potassium chloride SA (KLOR-CON M) 20 MEQ tablet Take 1 tablet (20 mEq total) by mouth daily for 5 days. 5 tablet 0   Tiotropium Bromide Monohydrate (SPIRIVA RESPIMAT) 2.5 MCG/ACT AERS Inhale 2 puffs into the lungs daily. 1 each 5   ALPRAZolam (XANAX) 0.25 MG tablet TAKE 1 TABLET BY MOUTH TWICE DAILY AS NEEDED FOR ANXIETY OR SLEEP (Patient taking differently: Take 0.25 mg by mouth 2 (two) times daily as needed for anxiety or sleep.) 30 tablet 0   Facility-Administered Medications Prior to Visit  Medication Dose Route Frequency Provider Last Rate Last Admin   ipratropium-albuterol (DUONEB) 0.5-2.5 (3) MG/3ML nebulizer solution 3 mL  3 mL Nebulization Q6H Martyn Ehrich, NP   3 mL at 11/17/21 1357    No Known Allergies      Objective:    Physical Exam Vitals reviewed.  Constitutional:      Appearance: Normal appearance. She is obese.  Cardiovascular:     Rate and Rhythm: Normal rate and regular rhythm.  Pulmonary:     Effort: Pulmonary effort is normal. No accessory muscle usage, prolonged expiration or respiratory distress.     Breath sounds: Decreased air movement (worse in bil lower lobes) present. Examination of the right-upper field reveals wheezing. Examination of the left-upper field reveals wheezing. Examination of the  right-middle field reveals wheezing. Examination of the left-middle field reveals wheezing. Examination of the right-lower field reveals wheezing. Examination of the left-lower field reveals wheezing. Wheezing present.  Neurological:     General: No focal deficit present.     Mental Status: She is alert and oriented to person, place, and time. Mental status is at baseline.  Psychiatric:        Mood and Affect: Mood normal.        Behavior: Behavior normal.        Thought Content: Thought content normal.        Judgment: Judgment normal.       BP 112/72 (BP Location: Left Arm, Patient Position: Sitting)   Pulse 73   Temp (!) 97.1 F (36.2 C) (Skin)   Ht '5\' 4"'$  (1.626 m)   Wt 216 lb (98 kg)   LMP 02/14/2022   SpO2 99%   BMI 37.08 kg/m  Wt Readings from Last 3 Encounters:  02/21/22 216 lb (98 kg)  02/10/22 205 lb 7.5 oz (93.2 kg)  12/02/21 210 lb 9.6 oz (95.5 kg)     Health Maintenance Due  Topic Date Due   DTaP/Tdap/Td (1 - Tdap) Never done   PAP SMEAR-Modifier  Never done   COVID-19 Vaccine (2 - 2023-24 season) 10/14/2021    There are no preventive care reminders to display for this patient.  Lab Results  Component Value Date   TSH 1.99 11/22/2021   Lab Results  Component Value Date   WBC 3.9 (L) 02/11/2022   HGB 10.0 (L) 02/11/2022   HCT 34.0 (L) 02/11/2022   MCV 81.1 02/11/2022   PLT 173 02/11/2022   Lab Results  Component Value Date   NA 138 02/12/2022   K 3.3 (L) 02/12/2022   CO2 30 02/12/2022   GLUCOSE  82 02/12/2022   BUN 14 02/12/2022   CREATININE 0.55 02/12/2022   BILITOT 0.4 02/08/2022   ALKPHOS 62 02/08/2022   AST 39 02/08/2022   ALT 22 02/08/2022   PROT 7.6 02/08/2022   ALBUMIN 4.2 02/08/2022   CALCIUM 8.7 (L) 02/12/2022   ANIONGAP 6 02/12/2022   GFR 103.11 11/22/2021   Lab Results  Component Value Date   CHOL 190 10/24/2021   Lab Results  Component Value Date   HDL 67.60 10/24/2021   Lab Results  Component Value Date   LDLCALC  103 (H) 10/24/2021   Lab Results  Component Value Date   TRIG 98.0 10/24/2021   Lab Results  Component Value Date   CHOLHDL 3 10/24/2021   Lab Results  Component Value Date   HGBA1C 5.5 11/22/2021      Assessment & Plan:   Problem List Items Addressed This Visit       Respiratory   COPD exacerbation (HCC)    Rx prednisone 40 mg once daily for five days  If sob worsens go to er and or call 911.  Nebulizer prn  Albuterol prn  Continue symbicort and spiriva      Relevant Medications   predniSONE (DELTASONE) 20 MG tablet   RESOLVED: Acute respiratory failure with hypoxia (HCC)    Resolved.  O2 sats stable.        Other   Wheezing    CXR ordered to r/o pneumonia as wheezing and decreased breath sounds      Relevant Medications   predniSONE (DELTASONE) 20 MG tablet   Other Relevant Orders   DG Chest 2 View   CBC   Cessation of tobacco use in previous 12 months    Smoking cessation instruction/counseling given:  commended patient for quitting and reviewed strategies for preventing relapses       Hypocalcemia   Relevant Orders   Basic metabolic panel   Hypokalemia - Primary   Relevant Orders   Basic metabolic panel   Other Visit Diagnoses     Decreased breath sounds of both lungs       Relevant Medications   predniSONE (DELTASONE) 20 MG tablet   Other Relevant Orders   DG Chest 2 View   CBC       Meds ordered this encounter  Medications   predniSONE (DELTASONE) 20 MG tablet    Sig: Take two tablets once daily for five days    Dispense:  10 tablet    Refill:  0    Order Specific Question:   Supervising Provider    Answer:   BEDSOLE, AMY E [[id number]]   ALPRAZolam (XANAX) 0.25 MG tablet    Sig: Take 1 tablet (0.25 mg total) by mouth 2 (two) times daily as needed for anxiety or sleep.    Dispense:  30 tablet    Refill:  0    Order Specific Question:   Supervising Provider    Answer:   Diona Browner, AMY E [[id number]]    Follow-up: Return in about 1 month  (around 03/24/2022) for f/u hypokalemia.    Eugenia Pancoast, FNP

## 2022-02-22 ENCOUNTER — Other Ambulatory Visit: Payer: Self-pay | Admitting: Family

## 2022-02-22 DIAGNOSIS — J40 Bronchitis, not specified as acute or chronic: Secondary | ICD-10-CM

## 2022-02-22 MED ORDER — AZITHROMYCIN 250 MG PO TABS: ORAL_TABLET | ORAL | 0 refills | Status: AC

## 2022-02-22 NOTE — Progress Notes (Signed)
Sending in zpack for pt to get started taking for suspected bronchitis.

## 2022-03-06 ENCOUNTER — Ambulatory Visit: Payer: BC Managed Care – PPO | Admitting: Primary Care

## 2022-03-06 NOTE — Progress Notes (Deleted)
$@PatientV$  ID: [name], female    DOB: 11-04-1975, 47 y.o.   MRN: [id number]  No chief complaint on file.   Referring provider: Eugenia Pancoast, FNP  HPI:  47 year old female, current everyday smoker (25 pack year hx). PMH significant for COPD GOLD II, CAP, acute respiratory failure, hypothyroidism, alcohol dependence, major depression, anemia, tobacco abuse. Patient of Dr. Melvyn Novas, last seen by pulmonary NP on 02/19/20.  Previous [facility] pulmonary encounter: 02/19/2020  47 year old female former smoker followed in our office for COPD.  She is established with Dr. Melvyn Novas.  She was last seen in December/2021.  She is a previous [facility] patient.  At last visit in December/2021 she was status post hospitalization in September/2021.  Patient is presenting today after completing pulmonary function testing.  Since last being seen patient was seen in urgent care on 01/22/2020 for chronic pain in both shoulders.  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  She reports that she utilizes OSHA mask guidelines.  There is some dust, as well as smoking and heat in the job that she does.  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  She does feel that she works in a fairly open area.  Patient does not have recent CT imaging of her chest.  Prior to working as a Glass blower/designer she is to work in [employer].  Patient is currently in the process of trying to obtain a license.  She had a DUI when she was younger about 2 decades ago.  She is presenting today with the [facility] ignition interlock medical accommodation form to be completed.  03/02/2020 Patient presents today for 1 week follow-up with Spirometry. Maintained on Breztri. Uses oxygen at night. Received flu vaccine last week. She has shortness of breath with activities. She is compliant with Breztri two puffs twice daily. She requires her albuterol rescue in 2-3 times a day. No acute symptoms today.  She tested positive for COVID 2 weeks ago at an [place] care, states that she was treated with Vit C, zithromax and prednisone. She did not require hospitalization and has no residual symptoms. She is looking to obtain her drivers license. She got a DUI over 20 years ago. Reports that she has been sober for 4 years. Needing two spirometry tests to show that she is unable to use interlock system. Denies chest tightness, wheezing or cough.   11/16/2021  Patient presents today for hospital follow-up. History of COPD.  Patient was admitted to the hospital from 11/12/2021 - 11/15/2021 for COPD exacerbation secondary to pneumonia.  She was treated with azithromycin and Rocephin along with IV Solu-Medrol and bronchodilators treatments.  Patient symptoms returned to baseline.  She was discharged on prednisone 51m x5 days and Augmentin.   She is feeling a little better. She still has productive cough, shortness of breath and wheezing. She is currently on Augmentin and prednisone from the hospital. She is on 4L oxygen. She is using her mother portable tank and would like to get an order for portable concentrator. She needs an new nebulizer machine. She is maintained on Symbicort 867m. She started smoking again 6 weeks ago. She is out of work currently d/t herniated disc.   12/02/2021 Patient presents today for 2 week follow-up. She is feeling well today, reports that he cough and shortness of breath are better. She has residual fatigue. She was on sample of Breztri but this inhaler is not  covered by her insurance. She has quit smoking and remains motivated to abstain from cigarettes.          No Known Allergies  Immunization History  Administered Date(s) Administered   Influenza Inj Mdck Quad With Preservative 09/26/2021   Influenza,inj,Quad PF,6+ Mos 11/12/2014, 02/13/2016, 12/07/2016, 02/19/2020   Janssen (J&J) SARS-COV-2 Vaccination 07/21/2019    Past Medical History:  Diagnosis  Date   Alcohol abuse    Anemia 01/31/2011   Asthma    COPD (chronic obstructive pulmonary disease) (HCC)    Depression    Hepatitis C 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 10/24/2021   RNA negative.  Hep C false positive.     Hypoxia 02/01/2011   Medical history non-contributory    Needs sleep apnea assessment 2013.07.31   Tobacco abuse     Tobacco History: Social History   Tobacco Use  Smoking Status Former   Packs/day: 0.50   Years: 25.00   Total pack years: 12.50   Types: Cigarettes   Quit date: 11/11/2021   Years since quitting: 0.3  Smokeless Tobacco Never  Tobacco Comments   Pt states she stopped smoking when she got out of hospital. ALS 10/20   Counseling given: Not Answered Tobacco comments: Pt states she stopped smoking when she got out of hospital. ALS 10/20   Outpatient Medications Prior to Visit  Medication Sig Dispense Refill   albuterol (PROVENTIL) (2.5 MG/3ML) 0.083% nebulizer solution Take 3 mLs (2.5 mg total) by nebulization every 6 (six) hours as needed for wheezing or shortness of breath. For asthma 75 mL 1   albuterol (VENTOLIN HFA) 108 (90 Base) MCG/ACT inhaler INHALE 1 TO 2 PUFFS BY MOUTH EVERY 6 HOURS AS NEEDED FOR WHEEZING OR SHORTNESS OF BREATH 9 g 3   ALPRAZolam (XANAX) 0.25 MG tablet Take 1 tablet (0.25 mg total) by mouth 2 (two) times daily as needed for anxiety or sleep. 30 tablet 0   budesonide-formoterol (SYMBICORT) 80-4.5 MCG/ACT inhaler Inhale 2 puffs into the lungs 2 (two) times daily.     FLUoxetine (PROZAC) 40 MG capsule Take 1 capsule (40 mg total) by mouth daily. 90 capsule 1   gabapentin (NEURONTIN) 300 MG capsule Take 1 capsule (300 mg total) by mouth daily as needed (pain). 90 capsule 0   guaiFENesin (ROBITUSSIN) 100 MG/5ML liquid Take 5 mLs by mouth every 4 (four) hours as needed for cough or to loosen phlegm. 120 mL 0   ipratropium-albuterol (DUONEB) 0.5-2.5 (3) MG/3ML SOLN Take 3 mLs by nebulization every 4 (four) hours as needed (shortness of   breath).     levothyroxine (SYNTHROID) 150 MCG tablet Take 175 mg tablet daily on Saturday and Sunday every week, take 150 mg tablet once daily Monday through Friday every week and repeat therafter 90 tablet 3   levothyroxine (SYNTHROID) 175 MCG tablet Take 175 mg tablet daily on Saturday and Sunday every week, take 150 mg tablet once daily Monday through Friday every week and repeat therafter 30 tablet 1   montelukast (SINGULAIR) 10 MG tablet Take 1 tablet (10 mg total) by mouth at bedtime. 30 tablet 0   pantoprazole (PROTONIX) 40 MG tablet Take 1 tablet (40 mg total) by mouth daily. 90 tablet 3   potassium chloride SA (KLOR-CON M) 20 MEQ tablet Take 1 tablet (20 mEq total) by mouth daily for 5 days. 5 tablet 0   predniSONE (DELTASONE) 20 MG tablet Take two tablets once daily for five days 10 tablet 0   Tiotropium Bromide Monohydrate (SPIRIVA RESPIMAT)  2.5 MCG/ACT AERS Inhale 2 puffs into the lungs daily. 1 each 5   Facility-Administered Medications Prior to Visit  Medication Dose Route Frequency Provider Last Rate Last Admin   ipratropium-albuterol (DUONEB) 0.5-2.5 (3) MG/3ML nebulizer solution 3 mL  3 mL Nebulization Q6H Martyn Ehrich, NP   3 mL at 11/17/21 1357      Review of Systems  Review of Systems   Physical Exam  LMP 02/14/2022  Physical Exam   Lab Results:  CBC    Component Value Date/Time   WBC 6.1 02/21/2022 1015   RBC 4.32 02/21/2022 1015   HGB 10.2 (L) 02/21/2022 1015   HGB 11.6 (L) 05/13/2013 0449   HCT 32.8 (L) 02/21/2022 1015   HCT 36.2 05/13/2013 0449   PLT 375.0 02/21/2022 1015   PLT 264 05/13/2013 0449   MCV 75.9 (L) 02/21/2022 1015   MCV 82 05/13/2013 0449   MCH 23.9 (L) 02/11/2022 1255   MCHC 31.0 02/21/2022 1015   RDW 16.9 (H) 02/21/2022 1015   RDW 15.3 (H) 05/13/2013 0449   LYMPHSABS 0.7 02/07/2022 1715   LYMPHSABS 2.4 05/13/2013 0449   MONOABS 0.6 02/07/2022 1715   MONOABS 0.9 05/13/2013 0449   EOSABS 0.0 02/07/2022 1715   EOSABS 0.0  05/13/2013 0449   BASOSABS 0.0 02/07/2022 1715   BASOSABS 0.0 05/13/2013 0449    BMET    Component Value Date/Time   NA 139 02/21/2022 1015   NA 136 05/11/2013 0341   K 4.2 02/21/2022 1015   K 4.1 05/11/2013 0341   CL 106 02/21/2022 1015   CL 106 05/11/2013 0341   CO2 27 02/21/2022 1015   CO2 27 05/11/2013 0341   GLUCOSE 73 02/21/2022 1015   GLUCOSE 143 (H) 05/11/2013 0341   BUN 10 02/21/2022 1015   BUN 12 05/11/2013 0341   CREATININE 0.81 02/21/2022 1015   CREATININE 0.71 05/15/2013 0629   CALCIUM 8.8 02/21/2022 1015   CALCIUM 8.7 05/11/2013 0341   GFRNONAA >60 02/12/2022 0506   GFRNONAA >60 05/15/2013 0629   GFRAA >60 10/25/2019 0739   GFRAA >60 05/15/2013 0629    BNP    Component Value Date/Time   BNP 59.0 02/07/2022 1715    ProBNP    Component Value Date/Time   PROBNP 106.5 01/28/2011 2052    Imaging: DG Chest 2 View  Result Date: 02/21/2022 CLINICAL DATA:  47 year old female with history of wheezing. Recent history of influenza. EXAM: CHEST - 2 VIEW COMPARISON:  Chest x-ray 02/07/2022. FINDINGS: Lung volumes are normal. No confluent consolidative airspace disease. Linear subsegmental atelectasis in the periphery of the right lower lobe. However, there is widespread interstitial prominence and peribronchial cuffing, most evident in the mid to lower lungs bilaterally, concerning for bronchitis. No pneumothorax. No pleural effusions. No evidence of pulmonary edema. Heart size is normal. Upper mediastinal contours are within normal limits. IMPRESSION: 1. The appearance the chest is most compatible with an acute bronchitis, as above. Electronically Signed   By: Vinnie Langton [name]   On: 02/21/2022 10:20   DG Chest Port 1 View  Result Date: 02/07/2022 CLINICAL DATA:  Shortness of breath EXAM: PORTABLE CHEST 1 VIEW COMPARISON:  December 16, 2021 FINDINGS: The heart size and mediastinal contours are within normal limits. Both lungs are clear. The visualized skeletal  structures are unremarkable. IMPRESSION: No active disease. Electronically Signed   By: Dorise Bullion III [name]   On: 02/07/2022 17:35     Assessment & Plan:   No problem-specific  Assessment & Plan notes found for this encounter.     Martyn Ehrich, NP 03/06/2022

## 2022-03-24 ENCOUNTER — Ambulatory Visit: Payer: Self-pay | Admitting: Family

## 2022-04-10 ENCOUNTER — Emergency Department (HOSPITAL_COMMUNITY)
Admission: EM | Admit: 2022-04-10 | Discharge: 2022-04-10 | Disposition: A | Discharge status: Home / Self Care | Payer: Self-pay | Attending: Emergency Medicine | Admitting: Emergency Medicine

## 2022-04-10 ENCOUNTER — Emergency Department (HOSPITAL_COMMUNITY): Payer: Self-pay

## 2022-04-10 ENCOUNTER — Other Ambulatory Visit: Payer: Self-pay

## 2022-04-10 ENCOUNTER — Encounter (HOSPITAL_COMMUNITY): Payer: Self-pay | Admitting: Emergency Medicine

## 2022-04-10 DIAGNOSIS — M549 Dorsalgia, unspecified: Secondary | ICD-10-CM | POA: Insufficient documentation

## 2022-04-10 DIAGNOSIS — Z7951 Long term (current) use of inhaled steroids: Secondary | ICD-10-CM | POA: Insufficient documentation

## 2022-04-10 DIAGNOSIS — J441 Chronic obstructive pulmonary disease with (acute) exacerbation: Secondary | ICD-10-CM | POA: Insufficient documentation

## 2022-04-10 DIAGNOSIS — R109 Unspecified abdominal pain: Secondary | ICD-10-CM | POA: Insufficient documentation

## 2022-04-10 DIAGNOSIS — J45909 Unspecified asthma, uncomplicated: Secondary | ICD-10-CM | POA: Insufficient documentation

## 2022-04-10 DIAGNOSIS — Z1152 Encounter for screening for COVID-19: Secondary | ICD-10-CM | POA: Insufficient documentation

## 2022-04-10 DIAGNOSIS — R091 Pleurisy: Secondary | ICD-10-CM | POA: Insufficient documentation

## 2022-04-10 LAB — URINALYSIS, ROUTINE W REFLEX MICROSCOPIC
Bilirubin Urine: NEGATIVE
Glucose, UA: NEGATIVE mg/dL
Ketones, ur: NEGATIVE mg/dL
Leukocytes,Ua: NEGATIVE
Nitrite: NEGATIVE
Protein, ur: NEGATIVE mg/dL
Specific Gravity, Urine: 1.018 (ref 1.005–1.030)
pH: 5 (ref 5.0–8.0)

## 2022-04-10 LAB — CBC WITH DIFFERENTIAL/PLATELET
Abs Immature Granulocytes: 0.01 10*3/uL (ref 0.00–0.07)
Basophils Absolute: 0.1 10*3/uL (ref 0.0–0.1)
Basophils Relative: 1 %
Eosinophils Absolute: 0.3 10*3/uL (ref 0.0–0.5)
Eosinophils Relative: 4 %
HCT: 36.6 % (ref 36.0–46.0)
Hemoglobin: 10.8 g/dL — ABNORMAL LOW (ref 12.0–15.0)
Immature Granulocytes: 0 %
Lymphocytes Relative: 34 %
Lymphs Abs: 2.1 10*3/uL (ref 0.7–4.0)
MCH: 24.3 pg — ABNORMAL LOW (ref 26.0–34.0)
MCHC: 29.5 g/dL — ABNORMAL LOW (ref 30.0–36.0)
MCV: 82.4 fL (ref 80.0–100.0)
Monocytes Absolute: 0.6 10*3/uL (ref 0.1–1.0)
Monocytes Relative: 10 %
Neutro Abs: 3 10*3/uL (ref 1.7–7.7)
Neutrophils Relative %: 51 %
Platelets: 257 10*3/uL (ref 150–400)
RBC: 4.44 MIL/uL (ref 3.87–5.11)
RDW: 17.2 % — ABNORMAL HIGH (ref 11.5–15.5)
WBC: 6 10*3/uL (ref 4.0–10.5)
nRBC: 0 % (ref 0.0–0.2)

## 2022-04-10 LAB — BASIC METABOLIC PANEL
Anion gap: 11 (ref 5–15)
BUN: 13 mg/dL (ref 6–20)
CO2: 23 mmol/L (ref 22–32)
Calcium: 8.8 mg/dL — ABNORMAL LOW (ref 8.9–10.3)
Chloride: 100 mmol/L (ref 98–111)
Creatinine, Ser: 1.04 mg/dL — ABNORMAL HIGH (ref 0.44–1.00)
GFR, Estimated: >60 mL/min (ref >60)
Glucose, Bld: 78 mg/dL (ref 70–99)
Potassium: 3.4 mmol/L — ABNORMAL LOW (ref 3.5–5.1)
Sodium: 134 mmol/L — ABNORMAL LOW (ref 135–145)

## 2022-04-10 LAB — PREGNANCY, URINE: Preg Test, Ur: NEGATIVE

## 2022-04-10 LAB — RESP PANEL BY RT-PCR (RSV, FLU A&B, COVID)  RVPGX2
Influenza A by PCR: NEGATIVE
Influenza B by PCR: NEGATIVE
Resp Syncytial Virus by PCR: NEGATIVE
SARS Coronavirus 2 by RT PCR: NEGATIVE

## 2022-04-10 LAB — BRAIN NATRIURETIC PEPTIDE: B Natriuretic Peptide: 9 pg/mL (ref 0.0–100.0)

## 2022-04-10 LAB — TROPONIN I (HIGH SENSITIVITY)
Troponin I (High Sensitivity): <2 ng/L (ref <18)
Troponin I (High Sensitivity): 2 ng/L (ref <18)

## 2022-04-10 LAB — D-DIMER, QUANTITATIVE: D-Dimer, Quant: 0.27 ug/mL-FEU (ref 0.00–0.50)

## 2022-04-10 MED ORDER — FENTANYL CITRATE PF 50 MCG/ML IJ SOSY
50.0000 ug | PREFILLED_SYRINGE | Freq: Once | INTRAMUSCULAR | Status: AC
  Administered 2022-04-10: 50 ug via INTRAVENOUS
  Filled 2022-04-10: qty 1

## 2022-04-10 MED ORDER — METHYLPREDNISOLONE SODIUM SUCC 125 MG IJ SOLR
125.0000 mg | Freq: Once | INTRAMUSCULAR | Status: AC
  Administered 2022-04-10: 125 mg via INTRAVENOUS
  Filled 2022-04-10: qty 2

## 2022-04-10 MED ORDER — IPRATROPIUM-ALBUTEROL 0.5-2.5 (3) MG/3ML IN SOLN
3.0000 mL | Freq: Once | RESPIRATORY_TRACT | Status: AC
  Administered 2022-04-10: 3 mL via RESPIRATORY_TRACT
  Filled 2022-04-10: qty 3

## 2022-04-10 MED ORDER — PREDNISONE 10 MG (21) PO TBPK: ORAL_TABLET | Freq: Every day | ORAL | 0 refills | Status: DC

## 2022-04-10 NOTE — ED Triage Notes (Signed)
Pt comes from home. Been feeling bad since Friday. Was herer recently with flu pt states. Pt states she has stage 3 COPD, asthma. Left ankle been swelling recntly says pt. Paon in left lower back that increases with movement.

## 2022-04-10 NOTE — ED Provider Notes (Signed)
[facility] Provider Note   CSN: [id number] Arrival date & time: 04/10/22  [id number]     History  Chief Complaint  Patient presents with   Shortness of Breath    [name] is a 47 y.o. female.  40   Shortness of Breath    Patient with medical history of COPD, tobacco abuse, anemia, asthma, GERD presents to the emergency department due to shortness of breath.  Symptoms started 4 days ago, she has been having difficulty breathing despite using her inhaler every 2-4 hours.  She denies coughing, has some swelling to her ankles bilaterally.  Also endorses pain to the left back which is worse with any movement, denies any direct trauma or injury.  States she is also having pleuritic pain with inspiration in her left side of her back.  Denies chest pain, productive cough, dysuria, hematuria, history of kidney stones.   Home Medications Prior to Admission medications   Medication Sig Start Date End Date Taking? Authorizing Provider  albuterol (PROVENTIL) (2.5 MG/3ML) 0.083% nebulizer solution Take 3 mLs (2.5 mg total) by nebulization every 6 (six) hours as needed for wheezing or shortness of breath. For asthma 12/11/20  Yes Memon, Jolaine Artist, MD  albuterol (VENTOLIN HFA) 108 (90 Base) MCG/ACT inhaler INHALE 1 TO 2 PUFFS BY MOUTH EVERY 6 HOURS AS NEEDED FOR WHEEZING OR SHORTNESS OF BREATH 02/13/22  Yes Pokhrel, Laxman, MD  ALPRAZolam (XANAX) 0.25 MG tablet Take 1 tablet (0.25 mg total) by mouth 2 (two) times daily as needed for anxiety or sleep. 02/21/22  Yes Dugal, Lawerance Bach, FNP  gabapentin (NEURONTIN) 300 MG capsule Take 1 capsule (300 mg total) by mouth daily as needed (pain). 02/13/22 05/14/22 Yes Pokhrel, Laxman, MD  ipratropium-albuterol (DUONEB) 0.5-2.5 (3) MG/3ML SOLN Take 3 mLs by nebulization every 4 (four) hours as needed (shortness of  breath). 11/16/21  Yes [provider]  levothyroxine (SYNTHROID) 150 MCG tablet Take 175 mg  tablet daily on Saturday and Sunday every week, take 150 mg tablet once daily Monday through Friday every week and repeat therafter 12/01/21  Yes Eugenia Pancoast, FNP  levothyroxine (SYNTHROID) 175 MCG tablet Take 175 mg tablet daily on Saturday and Sunday every week, take 150 mg tablet once daily Monday through Friday every week and repeat therafter 11/23/21  Yes Dugal, [name], FNP  montelukast (SINGULAIR) 10 MG tablet Take 1 tablet (10 mg total) by mouth at bedtime. 10/24/21  Yes Dugal, Lawerance Bach, FNP  pantoprazole (PROTONIX) 40 MG tablet Take 1 tablet (40 mg total) by mouth daily. 10/24/21  Yes Dugal, [name], FNP  predniSONE (STERAPRED UNI-PAK 21 TAB) 10 MG (21) TBPK tablet Take by mouth daily. Take 6 tabs by mouth daily  for 2 days, then 5 tabs for 2 days, then 4 tabs for 2 days, then 3 tabs for 2 days, 2 tabs for 2 days, then 1 tab by mouth daily for 2 days 04/10/22  Yes Sherrill Raring, PA-C  Tiotropium Bromide Monohydrate (SPIRIVA RESPIMAT) 2.5 MCG/ACT AERS Inhale 2 puffs into the lungs daily. 12/15/21  Yes Martyn Ehrich, NP  FLUoxetine (PROZAC) 40 MG capsule Take 1 capsule (40 mg total) by mouth daily. Patient not taking: Reported on 04/10/2022 10/24/21 04/22/22  Eugenia Pancoast, FNP  guaiFENesin (ROBITUSSIN) 100 MG/5ML liquid Take 5 mLs by mouth every 4 (four) hours as needed for cough or to loosen phlegm. Patient not taking: Reported on 04/10/2022 02/13/22   Flora Lipps, MD  potassium chloride SA (KLOR-CON  M) 20 MEQ tablet Take 1 tablet (20 mEq total) by mouth daily for 5 days. 02/13/22 02/21/22  Pokhrel, Corrie Mckusick, MD      Allergies    Patient has no known allergies.    Review of Systems   Review of Systems  Respiratory:  Positive for shortness of breath.     Physical Exam Updated Vital Signs BP 114/81   Pulse 78   Temp 98.1 F (36.7 C)   Resp 18   Ht '5\' 4"'$  (1.626 m)   Wt 99.8 kg   SpO2 98%   BMI 37.76 kg/m  Physical Exam Vitals and nursing note reviewed. Exam conducted with a chaperone  present.  Constitutional:      Appearance: Normal appearance. She is obese.  HENT:     Head: Normocephalic and atraumatic.  Eyes:     General: No scleral icterus.       Right eye: No discharge.        Left eye: No discharge.     Extraocular Movements: Extraocular movements intact.     Pupils: Pupils are equal, round, and reactive to light.  Cardiovascular:     Rate and Rhythm: Normal rate and regular rhythm.     Pulses: Normal pulses.     Heart sounds: Normal heart sounds. No murmur heard.    No friction rub. No gallop.  Pulmonary:     Effort: Pulmonary effort is normal. No respiratory distress.     Breath sounds: Examination of the right-lower field reveals wheezing. Examination of the left-lower field reveals wheezing. Decreased breath sounds and wheezing present.  Abdominal:     General: Abdomen is flat. Bowel sounds are normal. There is no distension.     Palpations: Abdomen is soft.     Tenderness: There is no abdominal tenderness. There is right CVA tenderness.  Musculoskeletal:     Comments: No edema  Skin:    General: Skin is warm and dry.     Coloration: Skin is not jaundiced.  Neurological:     Mental Status: She is alert. Mental status is at baseline.     Coordination: Coordination normal.     ED Results / Procedures / Treatments   Labs (all labs ordered are listed, but only abnormal results are displayed) Labs Reviewed  BASIC METABOLIC PANEL - Abnormal; Notable for the following components:      Result Value   Sodium 134 (*)    Potassium 3.4 (*)    Creatinine, Ser 1.04 (*)    Calcium 8.8 (*)    All other components within normal limits  CBC WITH DIFFERENTIAL/PLATELET - Abnormal; Notable for the following components:   Hemoglobin 10.8 (*)    MCH 24.3 (*)    MCHC 29.5 (*)    RDW 17.2 (*)    All other components within normal limits  URINALYSIS, ROUTINE W REFLEX MICROSCOPIC - Abnormal; Notable for the following components:   APPearance HAZY (*)    Hgb  urine dipstick SMALL (*)    Bacteria, UA RARE (*)    All other components within normal limits  RESP PANEL BY RT-PCR (RSV, FLU A&B, COVID)  RVPGX2  BRAIN NATRIURETIC PEPTIDE  D-DIMER, QUANTITATIVE  PREGNANCY, URINE  TROPONIN I (HIGH SENSITIVITY)  TROPONIN I (HIGH SENSITIVITY)    EKG None  Radiology CT Renal Stone Study  Result Date: 04/10/2022 CLINICAL DATA:  left-sided flank pain for 2 days/stones suspected EXAM: CT ABDOMEN AND PELVIS WITHOUT CONTRAST TECHNIQUE: Multidetector CT imaging of the abdomen  and pelvis was performed following the standard protocol without IV contrast. RADIATION DOSE REDUCTION: This exam was performed according to the departmental dose-optimization program which includes automated exposure control, adjustment of the mA and/or kV according to patient size and/or use of iterative reconstruction technique. COMPARISON:  05/07/2016 from [facility] FINDINGS: Lower chest: Bibasilar scarring or subsegmental atelectasis, most significant in the right middle lobe. Normal heart size without pericardial or pleural effusion. Hepatobiliary: Hepatomegaly at 20 cm. Normal gallbladder, without biliary duct dilatation. Pancreas: Normal, without mass or ductal dilatation. Spleen: Normal in size, without focal abnormality. Adrenals/Urinary Tract: Normal adrenal glands. Minimal motion degradation in the mid abdomen. No renal calculi or hydronephrosis. No hydroureter or ureteric calculi. No bladder calculi. Stomach/Bowel: Normal stomach, without wall thickening. Normal colon, appendix, and terminal ileum. Normal small bowel. Vascular/Lymphatic: Normal caliber of the aorta and branch vessels. No abdominopelvic adenopathy. Reproductive: Normal uterus and adnexa. Other: No significant free fluid.  No free intraperitoneal air. Musculoskeletal: Degenerative disc disease at L4-5. IMPRESSION: 1. No urinary tract calculi or hydronephrosis. 2. Minimal motion degradation in the mid abdomen. 3.  Hepatomegaly. Electronically Signed   By: Abigail Miyamoto [name]   On: 04/10/2022 12:00   DG Chest Portable 1 View  Result Date: 04/10/2022 CLINICAL DATA:  Shortness of breath over the last 3 days.  COPD. EXAM: PORTABLE CHEST 1 VIEW COMPARISON:  02/21/2022 FINDINGS: Artifact overlies the chest. Heart size is normal. There is central bronchial thickening. There is either a small amount of fluid in the minor fissure or plate like atelectasis in the right mid lung. No lobar consolidation or collapse. No visible effusion. IMPRESSION: Bronchitis pattern. Fluid in the minor fissure versus plate like atelectasis in the right mid lung. No lobar consolidation or collapse. Electronically Signed   By: Nelson Chimes [name]   On: 04/10/2022 09:37    Procedures Procedures    Medications Ordered in ED Medications  ipratropium-albuterol (DUONEB) 0.5-2.5 (3) MG/3ML nebulizer solution 3 mL (3 mLs Nebulization Given 04/10/22 1114)  methylPREDNISolone sodium succinate (SOLU-MEDROL) 125 mg/2 mL injection 125 mg (125 mg Intravenous Given 04/10/22 0923)  fentaNYL (SUBLIMAZE) injection 50 mcg (50 mcg Intravenous Given 04/10/22 1002)    ED Course/ Medical Decision Making/ A&P                             Medical Decision Making Amount and/or Complexity of Data Reviewed Labs: ordered. Radiology: ordered.  Risk Prescription drug management.   Patient presents to the emergency department due to shortness of breath and flank pain.  Differential includes not limited to COPD exacerbation, pneumonia, PE, atypical ACS, nephrolithiasis, UTI, pyelonephritis.  Physical exam is most suggestive of COPD exacerbation, patient is on 3 L supplemental oxygen chronically not hypoxic with supplemental air.  Afebrile, no SIRS criteria and does not appear septic.  Laboratory workup ordered, viewed and interpreted by myself.  Dimer is negative, not a PE.  Negative delta troponin, this in conjunction with lack of ischemic changes on EKG makes  me doubt ACS.  UA shows some trace hematuria, this with flank pain we will proceed with CT renal for additional evaluation.  No UTI, no gross electrolyte derangement or AKI.  CT renal study is negative for acute process.  Chest x-ray does not show any underlying pneumonia or acute process.  Patient felt improved after Solu-Medrol and breathing treatment, suspect the flank pain is most likely muscle, will send home with steroid Dosepak for  COPD exacerbation.  Stable for close outpatient follow-up.        Final Clinical Impression(s) / ED Diagnoses Final diagnoses:  COPD exacerbation ([place])  Flank pain    Rx / DC Orders ED Discharge Orders          Ordered    predniSONE (STERAPRED UNI-PAK 21 TAB) 10 MG (21) TBPK tablet  Daily        04/10/22 1234              Sherrill Raring, PA-C 04/10/22 1256    Milton Ferguson, MD 04/11/22 1528

## 2022-04-10 NOTE — ED Notes (Signed)
Pt care taken, pt said that her pain is a lot better, no complaints at this time.

## 2022-04-10 NOTE — ED Notes (Signed)
Checked in on pt following pain medication. Pt states pain is about a 3-4 now. Pt resting in room.

## 2022-04-10 NOTE — Discharge Instructions (Signed)
Your workup today was reassuring.  Use your inhaler as needed, take the steroids as prescribed to help with inflammation.  Follow-up with your doctor this week for reevaluation.  No signs of kidney stone, pneumonia, blood clot or acute emergent condition.

## 2022-04-11 ENCOUNTER — Telehealth: Payer: Self-pay

## 2022-04-11 NOTE — Transitions of Care (Post Inpatient/ED Visit) (Signed)
   04/11/2022  Name: [name] MRN: [id number] DOB: 03-01-1975  Today's TOC FU Call Status: Today's TOC FU Call Status:: Successful TOC FU Call Competed TOC FU Call Complete Date: 04/11/22  Transition Care Management Follow-up Telephone Call Date of Discharge: 04/10/22 Dis[name] Type of Discharge: Emergency Department Reason for ED Visit: Other: (COPD exacerbation) How have you been since you were released from the hospital?: Better Any questions or concerns?: No  Items Reviewed: Did you receive and understand the discharge instructions provided?: Yes Medications obtained and verified?: Yes (Medications Reviewed) Any new allergies since your discharge?: No Dietary orders reviewed?: NA Do you have support at home?: Yes  Home Care and Equipment/Supplies: [place] Ordered?: NA Any new equipment or medical supplies ordered?: NA  Functional Questionnaire: Do you need assistance with bathing/showering or dressing?: No Do you need assistance with meal preparation?: No Do you need assistance with eating?: No Do you have difficulty maintaining continence: No Do you need assistance with getting out of bed/getting out of a chair/moving?: No Do you have difficulty managing or taking your medications?: No  Folllow up appointments reviewed: PCP Follow-up appointment confirmed?: No (pt does not have insurance at the time) MD Provider Line Number:[phone] Given: Yes [facility] Follow-up appointment confirmed?: NA Do you need transportation to your follow-up appointment?: No Do you understand care options if your condition(s) worsen?: Yes-patient verbalized understanding    [place] LPN [place] Direct Dial [phone]

## 2022-04-17 ENCOUNTER — Other Ambulatory Visit: Payer: Self-pay

## 2022-04-19 ENCOUNTER — Other Ambulatory Visit: Payer: Self-pay | Admitting: Family

## 2022-04-19 DIAGNOSIS — M1990 Unspecified osteoarthritis, unspecified site: Secondary | ICD-10-CM

## 2022-04-24 ENCOUNTER — Encounter: Payer: BC Managed Care – PPO | Admitting: Family

## 2022-05-31 ENCOUNTER — Other Ambulatory Visit: Payer: Self-pay

## 2022-05-31 ENCOUNTER — Ambulatory Visit (INDEPENDENT_AMBULATORY_CARE_PROVIDER_SITE_OTHER): Payer: Self-pay

## 2022-05-31 ENCOUNTER — Ambulatory Visit
Admission: EM | Admit: 2022-05-31 | Discharge: 2022-05-31 | Disposition: A | Discharge status: Home / Self Care | Payer: Self-pay | Attending: Nurse Practitioner | Admitting: Nurse Practitioner

## 2022-05-31 ENCOUNTER — Encounter: Payer: Self-pay | Admitting: Emergency Medicine

## 2022-05-31 DIAGNOSIS — M25561 Pain in right knee: Secondary | ICD-10-CM

## 2022-05-31 DIAGNOSIS — M25461 Effusion, right knee: Secondary | ICD-10-CM

## 2022-05-31 NOTE — ED Provider Notes (Signed)
[facility]    CSN: [id number] Arrival date & time: 05/31/22  1046      History   Chief Complaint Chief Complaint  Patient presents with   Knee Pain    HPI [name] is a 47 y.o. female.   The history is provided by the patient.   The patient presents for complaints of right knee pain and swelling.  Symptoms started over the past 3 days.  Patient states she was about pulling branches out of her yard, and she remembers tripping and twisting the knee.  She now has pain to the outside of the right knee.  She states when she walks, the pain is on the inside of the knee, and pain radiates into the right calf.  She also states that the knee feels weak, and unstable.  She denies numbness, tingling, radiation of pain, or inability to bear weight.  Patient states that today, the pain became worse.  She states that there feels like there is a "pressure inside of the right knee".  She states she has been using Voltaren gel and taking her regular dose of gabapentin for pain.  Past Medical History:  Diagnosis Date   Alcohol abuse    Anemia 01/31/2011   Asthma    COPD (chronic obstructive pulmonary disease)    Depression    Hepatitis C 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 10/24/2021   RNA negative.  Hep C false positive.     Hypoxia 02/01/2011   Medical history non-contributory    Needs sleep apnea assessment 2013.07.31   Tobacco abuse     Patient Active Problem List   Diagnosis Date Noted   Hypokalemia 02/21/2022   Influenza A 02/08/2022   Abnormal EKG 02/07/2022   Cessation of tobacco use in previous 12 months 11/22/2021   Diarrhea of presumed infectious origin 11/22/2021   Hypocalcemia 11/22/2021   COPD exacerbation 11/12/2021   Pneumonia due to infectious organism 11/04/2021   Vitamin D deficiency 10/24/2021   Gastroesophageal reflux disease 10/24/2021   Hyperglycemia 10/24/2021   Snoring 10/24/2021   History of colon polyps 10/24/2021   Wheezing 10/24/2021    Osteoarthritis 10/24/2021   Hepatitis C 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 10/24/2021   Bilateral carotid bruits 10/24/2021   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, still smoking 01/16/2020   Acute on chronic respiratory failure with hypoxia 12/06/2016   Hypothyroidism 02/10/2016   Alcohol dependence (HCC) 01/16/2012   Anemia 01/31/2011   Tobacco abuse 01/08/2011   Major depression 01/08/2011    Past Surgical History:  Procedure Laterality Date   ANKLE GANGLION CYST EXCISION     BACK SURGERY     CESAREAN SECTION     COLONOSCOPY WITH PROPOFOL N/A 06/27/2016   Procedure: COLONOSCOPY WITH PROPOFOL;  Surgeon: Wyline Mood, MD;  Location: [facility] ENDOSCOPY;  Service: Endoscopy;  Laterality: N/A;   DILATION AND CURETTAGE OF UTERUS     ESOPHAGOGASTRODUODENOSCOPY (EGD) WITH PROPOFOL N/A 06/27/2016   Procedure: ESOPHAGOGASTRODUODENOSCOPY (EGD) WITH PROPOFOL;  Surgeon: Wyline Mood, MD;  Location[facility] END[facility]COPY;  Service: Endoscopy;  Laterality: N/A;    OB History     Gravida  2   Para  1   Term  1   Preterm      AB  1   Living  1      SAB  1   IAB      Ectopic      Multiple      Live Births  1  Home Medications    Prior to Admission medications   Medication Sig Start Date End Date Taking? Authorizing Provider  gabapentin (NEURONTIN) 300 MG capsule Take 1 capsule (300 mg total) by mouth daily as needed (pain). 02/13/22 05/31/22 Yes Pokhrel, Rebekah Chesterfield, MD  meloxicam (MOBIC) 7.5 MG tablet Take 1 tablet by mouth twice daily 04/20/22  Yes Dugal, [name], FNP  albuterol (PROVENTIL) (2.5 MG/3ML) 0.083% nebulizer solution Take 3 mLs (2.5 mg total) by nebulization every 6 (six) hours as needed for wheezing or shortness of breath. For asthma 12/11/20   Erick Blinks, MD  albuterol (VENTOLIN HFA) 108 (90 Base) MCG/ACT inhaler INHALE 1 TO 2 PUFFS BY MOUTH EVERY 6 HOURS AS NEEDED FOR WHEEZING OR SHORTNESS OF BREATH 02/13/22   Pokhrel, Rebekah Chesterfield, MD  ALPRAZolam (XANAX) 0.25 MG tablet Take 1  tablet (0.25 mg total) by mouth 2 (two) times daily as needed for anxiety or sleep. 02/21/22   Mort Sawyers, FNP  FLUoxetine (PROZAC) 40 MG capsule Take 1 capsule (40 mg total) by mouth daily. 10/24/21 04/22/22  Mort Sawyers, FNP  guaiFENesin (ROBITUSSIN) 100 MG/5ML liquid Take 5 mLs by mouth every 4 (four) hours as needed for cough or to loosen phlegm. 02/13/22   Pokhrel, Rebekah Chesterfield, MD  ipratropium-albuterol (DUONEB) 0.5-2.5 (3) MG/3ML SOLN Take 3 mLs by nebulization every 4 (four) hours as needed (shortness of  breath). 11/16/21   [provider]  levothyroxine (SYNTHROID) 150 MCG tablet Take 175 mg tablet daily on Saturday and Sunday every week, take 150 mg tablet once daily Monday through Friday every week and repeat therafter 12/01/21   Mort Sawyers, FNP  levothyroxine (SYNTHROID) 175 MCG tablet Take 175 mg tablet daily on Saturday and Sunday every week, take 150 mg tablet once daily Monday through Friday every week and repeat therafter 11/23/21   Mort Sawyers, FNP  montelukast (SINGULAIR) 10 MG tablet Take 1 tablet (10 mg total) by mouth at bedtime. 10/24/21   Mort Sawyers, FNP  pantoprazole (PROTONIX) 40 MG tablet Take 1 tablet (40 mg total) by mouth daily. 10/24/21   Mort Sawyers, FNP  potassium chloride SA (KLOR-CON M) 20 MEQ tablet Take 1 tablet (20 mEq total) by mouth daily for 5 days. 02/13/22 04/11/22  Pokhrel, Rebekah Chesterfield, MD  predniSONE (STERAPRED UNI-PAK 21 TAB) 10 MG (21) TBPK tablet Take by mouth daily. Take 6 tabs by mouth daily  for 2 days, then 5 tabs for 2 days, then 4 tabs for 2 days, then 3 tabs for 2 days, 2 tabs for 2 days, then 1 tab by mouth daily for 2 days 04/10/22   Theron Arista, PA-C  Tiotropium Bromide Monohydrate (SPIRIVA RESPIMAT) 2.5 MCG/ACT AERS Inhale 2 puffs into the lungs daily. 12/15/21   Glenford Bayley, NP    Family History Family History  Problem Relation Age of Onset   Stroke Mother    Hypothyroidism Mother    Coronary artery disease Mother    Throat  cancer Mother    Stroke Maternal Grandmother    Diabetes Maternal Grandmother    COPD Paternal Grandmother     Social History Social History   Tobacco Use   Smoking status: Former    Packs/day: 0.50    Years: 25.00    Additional pack years: 0.00    Total pack years: 12.50    Types: Cigarettes    Quit date: 11/11/2021    Years since quitting: 0.5   Smokeless tobacco: Never   Tobacco comments:    Pt states she stopped  smoking when she got out of hospital. ALS 10/20  Vaping Use   Vaping Use: Former  Substance Use Topics   Alcohol use: Yes   Drug use: Not Currently    Types: Marijuana, Cocaine    Comment: last >2 y/o     Allergies   Patient has no known allergies.   Review of Systems Review of Systems Per HPI  Physical Exam Triage Vital Signs ED Triage Vitals  Enc Vitals Group     BP 05/31/22 1130 117/61     Pulse Rate 05/31/22 1130 78     Resp 05/31/22 1130 20     Temp 05/31/22 1130 98.3 F (36.8 C)     Temp Source 05/31/22 1130 Oral     SpO2 05/31/22 1130 93 %     Weight --      Height --      Head Circumference --      Peak Flow --      Pain Score 05/31/22 1131 8     Pain Loc --      Pain Edu? --      Excl. in GC? --    No data found.  Updated Vital Signs BP 117/61 (BP Location: Right Arm)   Pulse 78   Temp 98.3 F (36.8 C) (Oral)   Resp 20   LMP 05/15/2022 (Approximate)   SpO2 93%   Visual Acuity Right Eye Distance:   Left Eye Distance:   Bilateral Distance:    Right Eye Near:   Left Eye Near:    Bilateral Near:     Physical Exam Vitals and nursing note reviewed.  Constitutional:      Appearance: Normal appearance. She is not toxic-appearing.  Eyes:     Extraocular Movements: Extraocular movements intact.     Pupils: Pupils are equal, round, and reactive to light.  Pulmonary:     Effort: Pulmonary effort is normal.  Musculoskeletal:     Cervical back: Normal range of motion.     Right knee: Swelling and effusion present. No  deformity, ecchymosis or bony tenderness. Tenderness present over the medial joint line and lateral joint line. Normal pulse.     Comments: Pain with flexion and extension  Skin:    General: Skin is warm and dry.  Neurological:     General: No focal deficit present.     Mental Status: She is alert and oriented to person, place, and time.  Psychiatric:        Mood and Affect: Mood normal.        Behavior: Behavior normal.      UC Treatments / Results  Labs (all labs ordered are listed, but only abnormal results are displayed) Labs Reviewed - No data to display  EKG   Radiology DG Knee Complete 4 Views Right  Result Date: 05/31/2022 CLINICAL DATA:  right knee pain and swelling EXAM: RIGHT KNEE - COMPLETE 4+ VIEW COMPARISON:  None Available. FINDINGS: No evidence of fracture or dislocation. Small knee joint effusion. No evidence of arthropathy or other focal bone abnormality. Soft tissues are unremarkable. IMPRESSION: Small knee joint effusion. No acute osseous abnormality. Electronically Signed   By: Duanne Guess D.O.   On: 05/31/2022 12:14    Procedures Procedures (including critical care time)  Medications Ordered in UC Medications - No data to display  Initial Impression / Assessment and Plan / UC Course  I have reviewed the triage vital signs and the nursing notes.  Pertinent labs &  imaging results that were available during my care of the patient were reviewed by me and considered in my medical decision making (see chart for details).  The patient is well-appearing, she is in no acute distress, vital signs are stable.  X-ray of the right knee does not show any fracture or dislocation, does shower a right knee effusion.  Differential diagnoses include a right knee sprain/strain, possible meniscal injury.  Patient was provided a compression sleeve for the right knee to help with swelling, and to provide additional support.  Patient was advised to continue use of the  Voltaren gel as needed, along with recommending over-the-counter Tylenol arthritis strength 650 mg tablets every 8 hours.  RICE therapy was also recommended for the patient.  Patient was advised that if symptoms do not improve over the next 1 to 2 weeks, recommend that she follow-up with orthopedics.  Patient was given information for [facility] and for [facility].  Patient is in agreement with this plan of care and verbalizes understanding.  All questions were answered.  Patient stable for discharge. Work note was provided.    Final Clinical Impressions(s) / UC Diagnoses   Final diagnoses:  Right knee pain, unspecified chronicity  Pain and swelling of right knee     Discharge Instructions      Your x-rays are negative for fracture or dislocation.  There does appear to be fluid on the right knee. May take over-the-counter Tylenol Arthritis Strength  tablets every 8 hours as needed for pain or discomfort. RICE therapy rest, ice, compression, and elevation until your symptoms improve.  Apply ice for 20 minutes, remove for 1 hour, then repeat as often as possible.  This will help with pain and swelling. Wear the compression sleeve with strenuous or prolonged activity. If symptoms do not improve within the next 1 to 2 weeks, or suddenly worsens, recommend following up with orthopedics.  You can follow-up with [facility] at [phone] or [facility] in [place] at [phone]. Follow-up as needed.     ED Prescriptions   None    PDMP not reviewed this encounter.   Abran Cantor, NP 05/31/22 1243

## 2022-05-31 NOTE — Discharge Instructions (Signed)
Your x-rays are negative for fracture or dislocation.  There does appear to be fluid on the right knee. May take over-the-counter Tylenol Arthritis Strength  tablets every 8 hours as needed for pain or discomfort. RICE therapy rest, ice, compression, and elevation until your symptoms improve.  Apply ice for 20 minutes, remove for 1 hour, then repeat as often as possible.  This will help with pain and swelling. Wear the compression sleeve with strenuous or prolonged activity. If symptoms do not improve within the next 1 to 2 weeks, or suddenly worsens, recommend following up with orthopedics.  You can follow-up with [facility] at [phone] or [facility] in [place] at [phone]. Follow-up as needed.

## 2022-05-31 NOTE — ED Triage Notes (Signed)
Pt reports right knee swelling and intermittent radiation of pain to right calf. Has tried otc medication and "rubs and sprays". Denies any known injury. Pt able to bear weight. No obvious deformity noted.

## 2022-09-05 ENCOUNTER — Other Ambulatory Visit: Payer: Self-pay | Admitting: Primary Care

## 2022-09-05 DIAGNOSIS — J209 Acute bronchitis, unspecified: Secondary | ICD-10-CM

## 2022-09-08 ENCOUNTER — Emergency Department (HOSPITAL_COMMUNITY)
Admission: EM | Admit: 2022-09-08 | Discharge: 2022-09-08 | Disposition: A | Discharge status: Home / Self Care | Payer: Self-pay | Attending: Emergency Medicine | Admitting: Emergency Medicine

## 2022-09-08 ENCOUNTER — Encounter (HOSPITAL_COMMUNITY): Payer: Self-pay | Admitting: Emergency Medicine

## 2022-09-08 ENCOUNTER — Other Ambulatory Visit: Payer: Self-pay

## 2022-09-08 ENCOUNTER — Emergency Department (HOSPITAL_COMMUNITY): Payer: Self-pay

## 2022-09-08 DIAGNOSIS — Z1152 Encounter for screening for COVID-19: Secondary | ICD-10-CM | POA: Insufficient documentation

## 2022-09-08 DIAGNOSIS — F17210 Nicotine dependence, cigarettes, uncomplicated: Secondary | ICD-10-CM | POA: Insufficient documentation

## 2022-09-08 DIAGNOSIS — J441 Chronic obstructive pulmonary disease with (acute) exacerbation: Secondary | ICD-10-CM | POA: Insufficient documentation

## 2022-09-08 DIAGNOSIS — J209 Acute bronchitis, unspecified: Secondary | ICD-10-CM

## 2022-09-08 LAB — CBC
HCT: 35.8 % — ABNORMAL LOW (ref 36.0–46.0)
Hemoglobin: 10.7 g/dL — ABNORMAL LOW (ref 12.0–15.0)
MCH: 24.3 pg — ABNORMAL LOW (ref 26.0–34.0)
MCHC: 29.9 g/dL — ABNORMAL LOW (ref 30.0–36.0)
MCV: 81.4 fL (ref 80.0–100.0)
Platelets: 277 10*3/uL (ref 150–400)
RBC: 4.4 MIL/uL (ref 3.87–5.11)
RDW: 15.9 % — ABNORMAL HIGH (ref 11.5–15.5)
WBC: 7.3 10*3/uL (ref 4.0–10.5)
nRBC: 0 % (ref 0.0–0.2)

## 2022-09-08 LAB — POC URINE PREG, ED: Preg Test, Ur: NEGATIVE

## 2022-09-08 LAB — RESP PANEL BY RT-PCR (RSV, FLU A&B, COVID)  RVPGX2
Influenza A by PCR: NEGATIVE
Influenza B by PCR: NEGATIVE
Resp Syncytial Virus by PCR: NEGATIVE
SARS Coronavirus 2 by RT PCR: NEGATIVE

## 2022-09-08 LAB — BASIC METABOLIC PANEL
Anion gap: 9 (ref 5–15)
BUN: 13 mg/dL (ref 6–20)
CO2: 23 mmol/L (ref 22–32)
Calcium: 9.2 mg/dL (ref 8.9–10.3)
Chloride: 103 mmol/L (ref 98–111)
Creatinine, Ser: 0.72 mg/dL (ref 0.44–1.00)
GFR, Estimated: >60 mL/min (ref >60)
Glucose, Bld: 76 mg/dL (ref 70–99)
Potassium: 4.1 mmol/L (ref 3.5–5.1)
Sodium: 135 mmol/L (ref 135–145)

## 2022-09-08 LAB — TROPONIN I (HIGH SENSITIVITY)
Troponin I (High Sensitivity): 3 ng/L (ref <18)
Troponin I (High Sensitivity): <2 ng/L (ref <18)

## 2022-09-08 MED ORDER — IPRATROPIUM-ALBUTEROL 0.5-2.5 (3) MG/3ML IN SOLN: 3.0000 mL | Freq: Once | RESPIRATORY_TRACT | Status: DC

## 2022-09-08 MED ORDER — IPRATROPIUM-ALBUTEROL 0.5-2.5 (3) MG/3ML IN SOLN
3.0000 mL | Freq: Once | RESPIRATORY_TRACT | Status: AC
  Administered 2022-09-08: 3 mL via RESPIRATORY_TRACT
  Filled 2022-09-08: qty 3

## 2022-09-08 MED ORDER — ALBUTEROL SULFATE (2.5 MG/3ML) 0.083% IN NEBU
INHALATION_SOLUTION | RESPIRATORY_TRACT | Status: AC
  Administered 2022-09-08: 3 mL via RESPIRATORY_TRACT
  Filled 2022-09-08: qty 12

## 2022-09-08 MED ORDER — PREDNISONE 50 MG PO TABS: 50.0000 mg | ORAL_TABLET | Freq: Every day | ORAL | 0 refills | Status: AC

## 2022-09-08 MED ORDER — MAGNESIUM SULFATE 2 GM/50ML IV SOLN
2.0000 g | Freq: Once | INTRAVENOUS | Status: AC
  Administered 2022-09-08: 2 g via INTRAVENOUS
  Filled 2022-09-08: qty 50

## 2022-09-08 MED ORDER — METHYLPREDNISOLONE SODIUM SUCC 125 MG IJ SOLR
125.0000 mg | Freq: Once | INTRAMUSCULAR | Status: AC
  Administered 2022-09-08: 125 mg via INTRAVENOUS
  Filled 2022-09-08: qty 2

## 2022-09-08 MED ORDER — IPRATROPIUM BROMIDE 0.02 % IN SOLN
0.5000 mg | Freq: Once | RESPIRATORY_TRACT | Status: AC
  Administered 2022-09-08: 0.5 mg via RESPIRATORY_TRACT
  Filled 2022-09-08: qty 2.5

## 2022-09-08 MED ORDER — ALBUTEROL (5 MG/ML) CONTINUOUS INHALATION SOLN
10.0000 mg/h | INHALATION_SOLUTION | Freq: Once | RESPIRATORY_TRACT | Status: AC
  Administered 2022-09-08: 10 mg/h via RESPIRATORY_TRACT
  Filled 2022-09-08: qty 20

## 2022-09-08 MED ORDER — ALBUTEROL SULFATE (2.5 MG/3ML) 0.083% IN NEBU
3.0000 mL | INHALATION_SOLUTION | RESPIRATORY_TRACT | Status: DC | PRN
  Filled 2022-09-08: qty 3

## 2022-09-08 MED ORDER — IPRATROPIUM-ALBUTEROL 0.5-2.5 (3) MG/3ML IN SOLN: 3.0000 mL | RESPIRATORY_TRACT | 0 refills | Status: DC | PRN

## 2022-09-08 MED ORDER — ALBUTEROL SULFATE HFA 108 (90 BASE) MCG/ACT IN AERS: INHALATION_SPRAY | RESPIRATORY_TRACT | 0 refills | Status: DC

## 2022-09-08 NOTE — ED Notes (Signed)
Called respiratory to request eval of pt at bedside.

## 2022-09-08 NOTE — ED Provider Notes (Signed)
[name]   CSN: [id number] Arrival date & time: 09/08/22  1329     History  Chief Complaint  Patient presents with   Shortness of Breath    [name] is a 47 y.o. female.  She has PMH of COPD.  Reports wheezing and increased shortness of breath x 1 week not relieved with her breathing treatments or inhaler at home.  She still is a smoker.  Smokes about half pack a day and is trying to quit. denies history of heart failure or other cardiac issues.  Denies production with her cough or fever.   Is on 3 L of oxygen at home as needed during the day and 3 L at night, and she follows with [facility]  Shortness of Breath      Home Medications Prior to Admission medications   Medication Sig Start Date End Date Taking? Authorizing Provider  ipratropium-albuterol (DUONEB) 0.5-2.5 (3) MG/3ML SOLN Take 3 mLs by nebulization every 4 (four) hours as needed. 09/08/22  Yes Devonda Pequignot A, PA-C  predniSONE (DELTASONE) 50 MG tablet Take 1 tablet (50 mg total) by mouth daily with breakfast for 4 days. 09/09/22 09/13/22 Yes Coltrane Tugwell A, PA-C  albuterol (PROVENTIL) (2.5 MG/3ML) 0.083% nebulizer solution Take 3 mLs (2.5 mg total) by nebulization every 6 (six) hours as needed for wheezing or shortness of breath. For asthma 12/11/20   Erick Blinks, MD  albuterol (VENTOLIN HFA) 108 (90 Base) MCG/ACT inhaler INHALE 1 TO 2 PUFFS BY MOUTH EVERY 6 HOURS AS NEEDED FOR WHEEZING OR SHORTNESS OF BREATH 09/08/22   Shalonda Sachse A, PA-C  ALPRAZolam (XANAX) 0.25 MG tablet Take 1 tablet (0.25 mg total) by mouth 2 (two) times daily as needed for anxiety or sleep. 02/21/22   Mort Sawyers, FNP  FLUoxetine (PROZAC) 40 MG capsule Take 1 capsule (40 mg total) by mouth daily. 10/24/21 04/22/22  Mort Sawyers, FNP  gabapentin (NEURONTIN) 300 MG capsule Take 1 capsule (300 mg total) by mouth daily as needed (pain). 02/13/22 05/31/22  Pokhrel,  Rebekah Chesterfield, MD  guaiFENesin (ROBITUSSIN) 100 MG/5ML liquid Take 5 mLs by mouth every 4 (four) hours as needed for cough or to loosen phlegm. 02/13/22   Pokhrel, Rebekah Chesterfield, MD  ipratropium-albuterol (DUONEB) 0.5-2.5 (3) MG/3ML SOLN Take 3 mLs by nebulization every 4 (four) hours as needed (shortness of  breath). 11/16/21   [provider]  levothyroxine (SYNTHROID) 150 MCG tablet Take 175 mg tablet daily on Saturday and Sunday every week, take 150 mg tablet once daily Monday through Friday every week and repeat therafter 12/01/21   Mort Sawyers, FNP  levothyroxine (SYNTHROID) 175 MCG tablet Take 175 mg tablet daily on Saturday and Sunday every week, take 150 mg tablet once daily Monday through Friday every week and repeat therafter 11/23/21   Mort Sawyers, FNP  meloxicam (MOBIC) 7.5 MG tablet Take 1 tablet by mouth twice daily 04/20/22   Mort Sawyers, FNP  montelukast (SINGULAIR) 10 MG tablet Take 1 tablet (10 mg total) by mouth at bedtime. 10/24/21   Mort Sawyers, FNP  pantoprazole (PROTONIX) 40 MG tablet Take 1 tablet (40 mg total) by mouth daily. 10/24/21   Mort Sawyers, FNP  potassium chloride SA (KLOR-CON M) 20 MEQ tablet Take 1 tablet (20 mEq total) by mouth daily for 5 days. 02/13/22 04/11/22  Pokhrel, Rebekah Chesterfield, MD  predniSONE (STERAPRED UNI-PAK 21 TAB) 10 MG (21) TBPK tablet Take by mouth daily. Take 6 tabs by  mouth daily  for 2 days, then 5 tabs for 2 days, then 4 tabs for 2 days, then 3 tabs for 2 days, 2 tabs for 2 days, then 1 tab by mouth daily for 2 days 04/10/22   Theron Arista, PA-C  Tiotropium Bromide Monohydrate (SPIRIVA RESPIMAT) 2.5 MCG/ACT AERS Inhale 2 puffs into the lungs daily. 12/15/21   Glenford Bayley, NP      Allergies    Patient has no known allergies.    Review of Systems   Review of Systems  Respiratory:  Positive for shortness of breath.     Physical Exam Updated Vital Signs BP 108/65   Pulse 85   Temp 98.2 F (36.8 C) (Oral)   Resp (!) 21   Ht 5\' 4"  (1.626  m)   Wt 99.8 kg   LMP 08/18/2022 (Approximate)   SpO2 95%   BMI 37.76 kg/m  Physical Exam Vitals and nursing Matthews reviewed.  Constitutional:      General: She is not in acute distress.    Appearance: She is well-developed.  HENT:     Head: Normocephalic and atraumatic.  Eyes:     Extraocular Movements: Extraocular movements intact.     Conjunctiva/sclera: Conjunctivae normal.     Pupils: Pupils are equal, round, and reactive to light.  Cardiovascular:     Rate and Rhythm: Normal rate and regular rhythm.     Heart sounds: No murmur heard. Pulmonary:     Effort: Pulmonary effort is normal. Tachypnea present. No respiratory distress.     Breath sounds: Examination of the right-upper field reveals wheezing. Examination of the left-upper field reveals wheezing. Examination of the right-middle field reveals wheezing. Examination of the left-middle field reveals wheezing. Examination of the right-lower field reveals wheezing. Examination of the left-lower field reveals wheezing. Wheezing present.  Abdominal:     Palpations: Abdomen is soft.     Tenderness: There is no abdominal tenderness.  Musculoskeletal:        General: No swelling. Normal range of motion.     Cervical back: Neck supple.     Right lower leg: No edema.     Left lower leg: No edema.  Skin:    General: Skin is warm and dry.     Capillary Refill: Capillary refill takes less than 2 seconds.  Neurological:     General: No focal deficit present.     Mental Status: She is alert.  Psychiatric:        Mood and Affect: Mood normal.     ED Results / Procedures / Treatments   Labs (all labs ordered are listed, but only abnormal results are displayed) Labs Reviewed  CBC - Abnormal; Notable for the following components:      Result Value   Hemoglobin 10.7 (*)    HCT 35.8 (*)    MCH 24.3 (*)    MCHC 29.9 (*)    RDW 15.9 (*)    All other components within normal limits  POC URINE PREG, ED - Normal  RESP PANEL BY  RT-PCR (RSV, FLU A&B, COVID)  [id number]  BASIC METABOLIC PANEL  TROPONIN I (HIGH SENSITIVITY)  TROPONIN I (HIGH SENSITIVITY)    EKG EKG Interpretation Date/Time:  Friday September 08 2022 13:43:48 EDT Ventricular Rate:  105 PR Interval:  124 QRS Duration:  72 QT Interval:  336 QTC Calculation: 444 R Axis:   54  Text Interpretation: Sinus tachycardia with Premature atrial complexes with Abberant conduction Otherwise normal ECG When  compared with ECG of 10-Apr-2022 09:05, No significant change since prior Confirmed by Meridee Score [phone]) on 09/08/2022 1:55:31 PM  Radiology DG Chest Port 1 View  Result Date: 09/08/2022 CLINICAL DATA:  141880 SOB (shortness of breath) 141880. EXAM: PORTABLE CHEST 1 VIEW COMPARISON:  04/10/2022. FINDINGS: Clear lungs. Stable cardiac and mediastinal contours. No pleural effusion or pneumothorax. Visualized bones and upper abdomen are unremarkable. IMPRESSION: No evidence of acute cardiopulmonary disease. Electronically Signed   By: Orvan Falconer [name]   On: 09/08/2022 14:51    Procedures Procedures    Medications Ordered in ED Medications  albuterol (PROVENTIL) (2.5 MG/3ML) 0.083% nebulizer solution 3 mL (3 mLs Inhalation Given 09/08/22 1305)  magnesium sulfate IVPB 2 g 50 mL (0 g Intravenous Stopped 09/08/22 1605)  methylPREDNISolone sodium succinate (SOLU-MEDROL) 125 mg/2 mL injection 125 mg (125 mg Intravenous Given 09/08/22 1405)  albuterol (PROVENTIL,VENTOLIN) solution continuous neb (10 mg/hr Nebulization Given 09/08/22 1359)  ipratropium (ATROVENT) nebulizer solution 0.5 mg (0.5 mg Nebulization Given 09/08/22 1359)  albuterol (PROVENTIL) (2.5 MG/3ML) 0.083% nebulizer solution (  Not Given 09/08/22 1401)  ipratropium-albuterol (DUONEB) 0.5-2.5 (3) MG/3ML nebulizer solution 3 mL (3 mLs Nebulization Given 09/08/22 1615)    ED Course/ Medical Decision Making/ A&P Clinical Course as of 09/08/22 1825  Fri Sep 08, 2022  1553 On reevaluation after her  continuous albuterol nebulizer, steroids and magnesium patient states she is feeling better, no longer audibly wheezing but does still have wheezes on auscultation diffusely.  Satting 93 to 94% on room air.  Discussed with patient we will give another breathing treatment and evaluate again.  If she is feeling better we will ambulate with pulse ox [CB]    Clinical Course User Index [CB] Ma Rings, PA-C                             Medical Decision Making This patient presents to the ED for concern of SOB and wheezing, this involves an extensive number of treatment options, and is a complaint that carries with it a high risk of complications and morbidity.  The differential diagnosis includes COPD exacerbation, pneumonia, pulmonary edema, other   Co morbidities that complicate the patient evaluation :   COPD, asthma, tobacco abuse   Additional history obtained:  Additional history obtained from EMR External records from outside source obtained and reviewed including notes, labs   Lab Tests:  I Ordered, and personally interpreted labs.  The pertinent results include: Globin 10.7 which is patient's baseline, troponin negative x 2, COVID flu and RSV negative, BMP normal, negative pregnancy test   Imaging Studies ordered:  I ordered imaging studies including x-ray chest I independently visualized and interpreted imaging which showed no pulmonary edema or infiltrate I agree with the radiologist interpretation   Cardiac Monitoring: / EKG:  The patient was maintained on a cardiac monitor.  I personally viewed and interpreted the cardiac monitored which showed an underlying rhythm of: Sinus, rate 105     Problem List / ED Course / Critical interventions / Medication management  Patient presents with cough and wheezing for the past week gradually worsening not improved with home neb treatments, inhaler at home is out.  She continues to smoke has history of COPD and follows with  [facility].  She was audibly wheezing on arrival with some mild tachypnea, this improved after multiple breathing treatments, she also giving steroids and magnesium.  I reevaluated patient after treatment.  She is feeling much better, states she wants to go home.  I offered admission for continued breathing treatments.  Her oxygen at lowest was 89% on room air.  Patient states she has oxygen at home, she wears 3 L as needed and 3 L at night at night every night.  She was able to ambulate in the ED.  Feels much better and does not want to be admitted at this time.  Discussed follow-up with pulmonology as an outpatient and strict return precautions given.  Will prescribe her steroids and refill inhalers/nebulizers for home.  She had no infiltrate on chest x-ray, no sputum production with her cough, no systemic symptoms, no recent hospitalizations for COPD exacerbations so do not feel she needs empiric antibiotics.  I have reviewed the patients home medicines and have made adjustments as needed       Amount and/or Complexity of Data Reviewed Labs: ordered. Radiology: ordered.  Risk Prescription drug management.           Final Clinical Impression(s) / ED Diagnoses Final diagnoses:  COPD exacerbation (HCC)    Rx / DC Orders ED Discharge Orders          Ordered    predniSONE (DELTASONE) 50 MG tablet  Daily with breakfast        09/08/22 1735    ipratropium-albuterol (DUONEB) 0.5-2.5 (3) MG/3ML SOLN  Every 4 hours PRN        09/08/22 1735    albuterol (VENTOLIN HFA) 108 (90 Base) MCG/ACT inhaler        09/08/22 [id number], PA-C 09/08/22 1825    Eber Hong, MD 09/09/22 1321

## 2022-09-08 NOTE — ED Notes (Signed)
Notified EDP of pt presentation and requested eval.

## 2022-09-08 NOTE — ED Triage Notes (Signed)
Pt via POV c/o SOB x several days, worse over the past several days. Plainly audible wheezing, rales from across the room w/o stethoscope and pt appears winded. PMH includes COPD and asthma, using rescue meds and nebs w/o relief. Pt reports 8/10 central nonradiating chest pain.

## 2022-09-11 ENCOUNTER — Telehealth: Payer: Self-pay

## 2022-09-11 NOTE — Transitions of Care (Post Inpatient/ED Visit) (Signed)
09/11/2022  Name: [name] MRN: [id number] DOB: Oct 08, 1975  Today's TOC FU Call Status: Today's TOC FU Call Status:: Successful TOC FU Call Competed TOC FU Call Complete Date: 09/11/22  Transition Care Management Follow-up Telephone Call Date of Discharge: 09/08/22 [name] Type of Discharge: Emergency Department Reason for ED Visit: Other: (COPD exacerbation) How have you been since you were released from the hospital?: Better Any questions or concerns?: No  Items Reviewed: Did you receive and understand the discharge instructions provided?: Yes Medications obtained,verified, and reconciled?: Yes (Medications Reviewed) Any new allergies since your discharge?: No Dietary orders reviewed?: Yes Do you have support at home?: Yes  Medications Reviewed Today: Medications Reviewed Today     Reviewed by Merleen Nicely, LPN (Licensed Practical Nurse) on 09/11/22 at 1428  Med List Status: <None>   Medication Order Taking? Sig Documenting Provider Last Dose Status Informant  albuterol (PROVENTIL) (2.5 MG/3ML) 0.083% nebulizer solution [id number] Yes Take 3 mLs (2.5 mg total) by nebulization every 6 (six) hours as needed for wheezing or shortness of breath. For asthma Erick Blinks, MD Taking Active Self, Pharmacy Records  albuterol (VENTOLIN HFA) 108 (90 Base) MCG/ACT inhaler [id number] Yes INHALE 1 TO 2 PUFFS BY MOUTH EVERY 6 HOURS AS NEEDED FOR WHEEZING OR SHORTNESS OF BREATH Beatty, Celeste A, PA-C Taking Active   ALPRAZolam (XANAX) 0.25 MG tablet [id number] Yes Take 1 tablet (0.25 mg total) by mouth 2 (two) times daily as needed for anxiety or sleep. Mort Sawyers, FNP Taking Active Self, Pharmacy Records  FLUoxetine (PROZAC) 40 MG capsule [id number] Yes Take 1 capsule (40 mg total) by mouth daily. Mort Sawyers, FNP Taking Active Self, Pharmacy Records  gabapentin (NEURONTIN) 300 MG capsule [id number] Yes Take 1 capsule (300 mg total) by mouth  daily as needed (pain). Pokhrel, Rebekah Chesterfield, MD Taking Active Self, Pharmacy Records  guaiFENesin (ROBITUSSIN) 100 MG/5ML liquid [id number] Yes Take 5 mLs by mouth every 4 (four) hours as needed for cough or to loosen phlegm. Joycelyn Das, MD Taking Active Self, Pharmacy Records  ipratropium-albuterol (DUONEB) 0.5-2.5 (3) MG/3ML nebulizer solution 3 mL [id number]   Glenford Bayley, NP  Active   ipratropium-albuterol (DUONEB) 0.5-2.5 (3) MG/3ML SOLN [id number] Yes Take 3 mLs by nebulization every 4 (four) hours as needed (shortness of  breath). [provider] Taking Active Self, Pharmacy Records  ipratropium-albuterol (DUONEB) 0.5-2.5 (3) MG/3ML SOLN [id number] Yes Take 3 mLs by nebulization every 4 (four) hours as needed. Carmel Sacramento A, PA-C Taking Active   levothyroxine (SYNTHROID) 150 MCG tablet [id number] Yes Take 175 mg tablet daily on Saturday and Sunday every week, take 150 mg tablet once daily Monday through Friday every week and repeat therafter Mort Sawyers, FNP Taking Active Self, Pharmacy Records  levothyroxine (SYNTHROID) 175 MCG tablet [id number] Yes Take 175 mg tablet daily on Saturday and Sunday every week, take 150 mg tablet once daily Monday through Friday every week and repeat therafter Mort Sawyers, FNP Taking Active Self, Pharmacy Records  meloxicam [facility]) 7.5 MG tablet [id number] Yes Take 1 tablet by mouth twice daily Mort Sawyers, FNP Taking Active   montelukast (SINGULAIR) 10 MG tablet [id number] Yes Take 1 tablet (10 mg total) by mouth at bedtime. Mort Sawyers, FNP Taking Active Self, Pharmacy Records  pantoprazole (PROTONIX) 40 MG tablet [id number] Yes Take 1 tablet (40 mg total) by mouth daily. Mort Sawyers, FNP Taking Active Self, Pharmacy Records  potassium chloride SA (KLOR-CON M) 20 MEQ tablet [id number] Yes Take 1  tablet (20 mEq total) by mouth daily for 5 days. Pokhrel, Laxman, MD Taking Active   predniSONE (DELTASONE) 50 MG tablet [id number] Yes Take 1  tablet (50 mg total) by mouth daily with breakfast for 4 days. Beatty, Celeste A, PA-C Taking Active   predniSONE (STERAPRED UNI-PAK 21 TAB) 10 MG (21) TBPK tablet [id number] Yes Take by mouth daily. Take 6 tabs by mouth daily  for 2 days, then 5 tabs for 2 days, then 4 tabs for 2 days, then 3 tabs for 2 days, 2 tabs for 2 days, then 1 tab by mouth daily for 2 days Theron Arista, PA-C Taking Active   Tiotropium Bromide Monohydrate (SPIRIVA RESPIMAT) 2.5 MCG/ACT AERS [id number] Yes Inhale 2 puffs into the lungs daily. Glenford Bayley, NP Taking Active Self, Pharmacy Records  Med List Note Orlin Hilding, CPhT 01/30/11 1156):              Home Care and Equipment/Supplies: Were Home Health Services Ordered?: NA Any new equipment or medical supplies ordered?: NA  Functional Questionnaire: Do you need assistance with bathing/showering or dressing?: No Do you need assistance with meal preparation?: No Do you need assistance with eating?: No Do you have difficulty maintaining continence: No Do you need assistance with getting out of bed/getting out of a chair/moving?: No Do you have difficulty managing or taking your medications?: No  Follow up appointments reviewed: PCP Follow-up appointment confirmed?: No (pt is in between jobs right now with no insurance and declines to come in at this time) MD Provider Line Number:[phone] Given: Yes Specialist Hospital Follow-up appointment confirmed?: NA Do you need transportation to your follow-up appointment?: No Do you understand care options if your condition(s) worsen?: Yes-patient verbalized understanding    SIGNATURE  Woodfin Ganja LPN [facility] Nurse Health Advisor Direct Dial [phone]

## 2022-09-22 ENCOUNTER — Telehealth: Payer: Self-pay | Admitting: Family

## 2022-09-22 DIAGNOSIS — J209 Acute bronchitis, unspecified: Secondary | ICD-10-CM

## 2022-09-22 NOTE — Telephone Encounter (Signed)
Prescription Request  09/22/2022  LOV: 02/21/2022  What is the name of the medication or equipment?  albuterol (VENTOLIN HFA) 108 (90 Base) MCG/ACT inhaler  Have you contacted your pharmacy to request a refill? Yes   Which pharmacy would you like this sent to?   [facility] [address], [place] - [address] [place] [address] Phone: [phone] Fax: [phone]    Patient notified that their request is being sent to the clinical staff for review and that they should receive a response within 2 business days.   Please advise at [facility] [phone]

## 2022-09-25 MED ORDER — ALBUTEROL SULFATE HFA 108 (90 BASE) MCG/ACT IN AERS
INHALATION_SPRAY | RESPIRATORY_TRACT | 0 refills | Status: DC
Start: 2022-09-25 — End: 2023-01-05

## 2022-10-15 ENCOUNTER — Other Ambulatory Visit: Payer: Self-pay | Admitting: Family

## 2022-10-15 DIAGNOSIS — J209 Acute bronchitis, unspecified: Secondary | ICD-10-CM

## 2022-11-09 ENCOUNTER — Ambulatory Visit
Admission: EM | Admit: 2022-11-09 | Discharge: 2022-11-09 | Disposition: A | Discharge status: Home / Self Care | Payer: BC Managed Care – PPO | Attending: Family Medicine | Admitting: Family Medicine

## 2022-11-09 DIAGNOSIS — M25561 Pain in right knee: Secondary | ICD-10-CM | POA: Diagnosis not present

## 2022-11-09 MED ORDER — DEXAMETHASONE SODIUM PHOSPHATE 10 MG/ML IJ SOLN
10.0000 mg | Freq: Once | INTRAMUSCULAR | Status: AC
  Administered 2022-11-09: 10 mg via INTRAMUSCULAR

## 2022-11-09 NOTE — ED Provider Notes (Signed)
[facility]    CSN: [id number] Arrival date & time: 11/09/22  1815      History   Chief Complaint No chief complaint on file.   HPI [name] is a 47 y.o. female.   Patient presenting today with 1 day history of significant right knee pain worse when weightbearing.  She states the pain is to the anterior knee and is sharp and stabbing with weightbearing.  Denies redness, swelling, fever, chills, known injury to the area.  So far trying topical pain patches, ice with minimal relief.    Past Medical History:  Diagnosis Date   Alcohol abuse    Anemia 01/31/2011   Asthma    COPD (chronic obstructive pulmonary disease) (HCC)    Depression    Hepatitis C 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 10/24/2021   RNA negative.  Hep C false positive.     Hypoxia 02/01/2011   Medical history non-contributory    Needs sleep apnea assessment 2013.07.31   Tobacco abuse     Patient Active Problem List   Diagnosis Date Noted   Hypokalemia 02/21/2022   Influenza A 02/08/2022   Abnormal EKG 02/07/2022   Cessation of tobacco use in previous 12 months 11/22/2021   Diarrhea of presumed infectious origin 11/22/2021   Hypocalcemia 11/22/2021   COPD exacerbation (HCC) 11/12/2021   Pneumonia due to infectious organism 11/04/2021   Vitamin D deficiency 10/24/2021   Gastroesophageal reflux disease 10/24/2021   Hyperglycemia 10/24/2021   Snoring 10/24/2021   History of colon polyps 10/24/2021   Wheezing 10/24/2021   Osteoarthritis 10/24/2021   Hepatitis C 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 10/24/2021   Bilateral carotid bruits 10/24/2021   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, still smoking 01/16/2020   Acute on chronic respiratory failure with hypoxia (HCC) 12/06/2016   Hypothyroidism 02/10/2016   Alcohol dependence (HCC) 01/16/2012   Anemia 01/31/2011   Tobacco abuse 01/08/2011   Major depression 01/08/2011    Past Surgical History:  Procedure Laterality Date   ANKLE GANGLION CYST  EXCISION     BACK SURGERY     CESAREAN SECTION     COLONOSCOPY WITH PROPOFOL N/A 06/27/2016   Procedure: COLONOSCOPY WITH PROPOFOL;  Surgeon: Wyline Mood, MD;  Location: [facility] ENDOSCOPY;  Service: Endoscopy;  Laterality: N/A;   DILATION AND CURETTAGE OF UTERUS     ESOPHAGOGASTRODUODENOSCOPY (EGD) WITH PROPOFOL N/A 06/27/2016   Procedure: ESOPHAGOGASTRODUODENOSCOPY (EGD) WITH PROPOFOL;  Surgeon: Wyline Mood, MD;  Location: [facility] ENDOSCOPY;  Service: Endoscopy;  Laterality: N/A;    OB History     Gravida  2   Para  1   Term  1   Preterm      AB  1   Living  1      SAB  1   IAB      Ectopic      Multiple      Live Births  1            Home Medications    Prior to Admission medications   Medication Sig Start Date End Date Taking? Authorizing Provider  albuterol (PROVENTIL) (2.5 MG/3ML) 0.083% nebulizer solution Take 3 mLs (2.5 mg total) by nebulization every 6 (six) hours as needed for wheezing or shortness of breath. For asthma 12/11/20   Erick Blinks, MD  albuterol (VENTOLIN HFA) 108 (90 Base) MCG/ACT inhaler INHALE 1 TO 2 PUFFS BY MOUTH EVERY 6 HOURS AS NEEDED FOR WHEEZING OR SHORTNESS OF BREATH 09/25/22   Mort Sawyers, FNP  ALPRAZolam (XANAX) 0.25 MG tablet Take 1 tablet (0.25 mg total) by mouth 2 (two) times daily as needed for anxiety or sleep. 02/21/22   Mort Sawyers, FNP  FLUoxetine (PROZAC) 40 MG capsule Take 1 capsule (40 mg total) by mouth daily. 10/24/21 09/11/22  Mort Sawyers, FNP  gabapentin (NEURONTIN) 300 MG capsule Take 1 capsule (300 mg total) by mouth daily as needed (pain). 02/13/22 09/11/22  Pokhrel, Rebekah Chesterfield, MD  guaiFENesin (ROBITUSSIN) 100 MG/5ML liquid Take 5 mLs by mouth every 4 (four) hours as needed for cough or to loosen phlegm. 02/13/22   Pokhrel, Rebekah Chesterfield, MD  ipratropium-albuterol (DUONEB) 0.5-2.5 (3) MG/3ML SOLN Take 3 mLs by nebulization every 4 (four) hours as needed (shortness of  breath). 11/16/21   [provider]   ipratropium-albuterol (DUONEB) 0.5-2.5 (3) MG/3ML SOLN Take 3 mLs by nebulization every 4 (four) hours as needed. 09/08/22   Carmel Sacramento A, PA-C  levothyroxine (SYNTHROID) 150 MCG tablet Take 175 mg tablet daily on Saturday and Sunday every week, take 150 mg tablet once daily Monday through Friday every week and repeat therafter 12/01/21   Mort Sawyers, FNP  levothyroxine (SYNTHROID) 175 MCG tablet Take 175 mg tablet daily on Saturday and Sunday every week, take 150 mg tablet once daily Monday through Friday every week and repeat therafter 11/23/21   Mort Sawyers, FNP  meloxicam (MOBIC) 7.5 MG tablet Take 1 tablet by mouth twice daily 04/20/22   Mort Sawyers, FNP  montelukast (SINGULAIR) 10 MG tablet Take 1 tablet (10 mg total) by mouth at bedtime. 10/24/21   Mort Sawyers, FNP  pantoprazole (PROTONIX) 40 MG tablet Take 1 tablet (40 mg total) by mouth daily. 10/24/21   Mort Sawyers, FNP  potassium chloride SA (KLOR-CON M) 20 MEQ tablet Take 1 tablet (20 mEq total) by mouth daily for 5 days. 02/13/22 09/11/22  Pokhrel, Rebekah Chesterfield, MD  predniSONE (STERAPRED UNI-PAK 21 TAB) 10 MG (21) TBPK tablet Take by mouth daily. Take 6 tabs by mouth daily  for 2 days, then 5 tabs for 2 days, then 4 tabs for 2 days, then 3 tabs for 2 days, 2 tabs for 2 days, then 1 tab by mouth daily for 2 days 04/10/22   Theron Arista, PA-C  Tiotropium Bromide Monohydrate (SPIRIVA RESPIMAT) 2.5 MCG/ACT AERS Inhale 2 puffs into the lungs daily. 12/15/21   Glenford Bayley, NP    Family History Family History  Problem Relation Age of Onset   Stroke Mother    Hypothyroidism Mother    Coronary artery disease Mother    Throat cancer Mother    Stroke Maternal Grandmother    Diabetes Maternal Grandmother    COPD Paternal Grandmother     Social History Social History   Tobacco Use   Smoking status: Former    Current packs/day: 0.00    Average packs/day: 0.5 packs/day for 25.0 years (12.5 ttl pk-yrs)    Types: Cigarettes     Start date: 11/11/1996    Quit date: 11/11/2021    Years since quitting: 0.9   Smokeless tobacco: Never   Tobacco comments:    Pt states she stopped smoking when she got out of hospital. ALS 10/20  Vaping Use   Vaping status: Former  Substance Use Topics   Alcohol use: Yes   Drug use: Not Currently    Types: Marijuana, Cocaine    Comment: last >2 y/o     Allergies   Patient has no known allergies.   Review of Systems Review of  Systems Per HPI  Physical Exam Triage Vital Signs ED Triage Vitals [11/09/22 1839]  Encounter Vitals Group     BP 112/64     Systolic BP Percentile      Diastolic BP Percentile      Pulse Rate 84     Resp 18     Temp 98.9 F (37.2 C)     Temp Source Oral     SpO2 95 %     Weight      Height      Head Circumference      Peak Flow      Pain Score 9     Pain Loc      Pain Education      Exclude from Growth Chart    No data found.  Updated Vital Signs BP 112/64 (BP Location: Right Arm)   Pulse 84   Temp 98.9 F (37.2 C) (Oral)   Resp 18   LMP 08/22/2022 (Approximate)   SpO2 95%   Visual Acuity Right Eye Distance:   Left Eye Distance:   Bilateral Distance:    Right Eye Near:   Left Eye Near:    Bilateral Near:     Physical Exam Vitals and nursing note reviewed.  Constitutional:      Appearance: Normal appearance. She is not ill-appearing.  HENT:     Head: Atraumatic.  Eyes:     Extraocular Movements: Extraocular movements intact.     Conjunctiva/sclera: Conjunctivae normal.  Cardiovascular:     Rate and Rhythm: Normal rate and regular rhythm.     Heart sounds: Normal heart sounds.  Pulmonary:     Effort: Pulmonary effort is normal.     Breath sounds: Normal breath sounds.  Musculoskeletal:        General: Tenderness present. No swelling, deformity or signs of injury. Normal range of motion.     Cervical back: Normal range of motion and neck supple.     Comments: Anterior right knee tender to palpation, no deformities  palpable, no joint instability, negative drawer testing and McMurray's testing.  Antalgic gait  Skin:    General: Skin is warm and dry.     Findings: No bruising or erythema.  Neurological:     Mental Status: She is alert and oriented to person, place, and time.     Comments: Right lower extremity neurovascularly intact  Psychiatric:        Mood and Affect: Mood normal.        Thought Content: Thought content normal.        Judgment: Judgment normal.      UC Treatments / Results  Labs (all labs ordered are listed, but only abnormal results are displayed) Labs Reviewed - No data to display  EKG   Radiology No results found.  Procedures Procedures (including critical care time)  Medications Ordered in UC Medications  dexamethasone (DECADRON) injection 10 mg (10 mg Intramuscular Given 11/09/22 1916)    Initial Impression / Assessment and Plan / UC Course  I have reviewed the triage vital signs and the nursing notes.  Pertinent labs & imaging results that were available during my care of the patient were reviewed by me and considered in my medical decision making (see chart for details).     Vitals and exam reassuring today, suspect inflammatory cause possibly tendinitis.  Treat with IM Decadron, supportive over-the-counter medications and home care.  Ortho follow-up if not improving.  Final Clinical Impressions(s) / UC Diagnoses  Final diagnoses:  Acute pain of right knee     Discharge Instructions      Rest, ice, elevation, compression, over-the-counter pain relievers.  Follow-up with Ortho if not improving.    ED Prescriptions   None    PDMP not reviewed this encounter.   Delanie, [place], [place] 11/09/22 [phone]

## 2022-11-09 NOTE — Discharge Instructions (Signed)
Rest, ice, elevation, compression, over-the-counter pain relievers.  Follow-up with Ortho if not improving.

## 2022-11-09 NOTE — ED Triage Notes (Signed)
Pt reports her right knee is painful when she stands up or walks x 1 day   Denies injury

## 2022-12-11 ENCOUNTER — Ambulatory Visit: Payer: BC Managed Care – PPO | Admitting: Family

## 2022-12-29 ENCOUNTER — Ambulatory Visit: Payer: BC Managed Care – PPO | Admitting: Family

## 2023-01-02 ENCOUNTER — Other Ambulatory Visit: Payer: Self-pay | Admitting: Family

## 2023-01-02 DIAGNOSIS — J209 Acute bronchitis, unspecified: Secondary | ICD-10-CM

## 2023-01-04 ENCOUNTER — Ambulatory Visit: Payer: BC Managed Care – PPO | Admitting: Family

## 2023-01-05 ENCOUNTER — Encounter: Payer: Self-pay | Admitting: *Deleted

## 2023-01-05 ENCOUNTER — Encounter: Payer: Self-pay | Admitting: Family

## 2023-01-05 ENCOUNTER — Ambulatory Visit: Payer: BC Managed Care – PPO | Admitting: Family

## 2023-01-05 VITALS — BP 130/74 | HR 70 | Temp 97.3°F | Ht 64.0 in | Wt 193.4 lb

## 2023-01-05 DIAGNOSIS — J209 Acute bronchitis, unspecified: Secondary | ICD-10-CM

## 2023-01-05 DIAGNOSIS — E7800 Pure hypercholesterolemia, unspecified: Secondary | ICD-10-CM | POA: Diagnosis not present

## 2023-01-05 DIAGNOSIS — I2721 Secondary pulmonary arterial hypertension: Secondary | ICD-10-CM

## 2023-01-05 DIAGNOSIS — M5442 Lumbago with sciatica, left side: Secondary | ICD-10-CM

## 2023-01-05 DIAGNOSIS — M479 Spondylosis, unspecified: Secondary | ICD-10-CM

## 2023-01-05 DIAGNOSIS — J441 Chronic obstructive pulmonary disease with (acute) exacerbation: Secondary | ICD-10-CM | POA: Diagnosis not present

## 2023-01-05 DIAGNOSIS — M2550 Pain in unspecified joint: Secondary | ICD-10-CM

## 2023-01-05 DIAGNOSIS — M5441 Lumbago with sciatica, right side: Secondary | ICD-10-CM

## 2023-01-05 DIAGNOSIS — E876 Hypokalemia: Secondary | ICD-10-CM

## 2023-01-05 DIAGNOSIS — M1990 Unspecified osteoarthritis, unspecified site: Secondary | ICD-10-CM

## 2023-01-05 DIAGNOSIS — K219 Gastro-esophageal reflux disease without esophagitis: Secondary | ICD-10-CM

## 2023-01-05 DIAGNOSIS — R739 Hyperglycemia, unspecified: Secondary | ICD-10-CM | POA: Diagnosis not present

## 2023-01-05 DIAGNOSIS — G4734 Idiopathic sleep related nonobstructive alveolar hypoventilation: Secondary | ICD-10-CM | POA: Insufficient documentation

## 2023-01-05 DIAGNOSIS — Z720 Tobacco use: Secondary | ICD-10-CM

## 2023-01-05 DIAGNOSIS — E039 Hypothyroidism, unspecified: Secondary | ICD-10-CM

## 2023-01-05 DIAGNOSIS — Z79899 Other long term (current) drug therapy: Secondary | ICD-10-CM

## 2023-01-05 DIAGNOSIS — F329 Major depressive disorder, single episode, unspecified: Secondary | ICD-10-CM

## 2023-01-05 DIAGNOSIS — J449 Chronic obstructive pulmonary disease, unspecified: Secondary | ICD-10-CM

## 2023-01-05 DIAGNOSIS — E559 Vitamin D deficiency, unspecified: Secondary | ICD-10-CM

## 2023-01-05 DIAGNOSIS — R0683 Snoring: Secondary | ICD-10-CM

## 2023-01-05 DIAGNOSIS — G8929 Other chronic pain: Secondary | ICD-10-CM | POA: Insufficient documentation

## 2023-01-05 DIAGNOSIS — Z8261 Family history of arthritis: Secondary | ICD-10-CM

## 2023-01-05 DIAGNOSIS — F1021 Alcohol dependence, in remission: Secondary | ICD-10-CM

## 2023-01-05 LAB — BASIC METABOLIC PANEL
BUN: 14 mg/dL (ref 6–23)
CO2: 29 meq/L (ref 19–32)
Calcium: 9.7 mg/dL (ref 8.4–10.5)
Chloride: 104 meq/L (ref 96–112)
Creatinine, Ser: 0.79 mg/dL (ref 0.40–1.20)
GFR: 88.48 mL/min (ref >60.00)
Glucose, Bld: 86 mg/dL (ref 70–99)
Potassium: 4.6 meq/L (ref 3.5–5.1)
Sodium: 140 meq/L (ref 135–145)

## 2023-01-05 LAB — LIPID PANEL
Cholesterol: 179 mg/dL (ref 0–200)
HDL: 56.5 mg/dL (ref >39.00)
LDL Cholesterol: 108 mg/dL — ABNORMAL HIGH (ref 0–99)
NonHDL: 122.61
Total CHOL/HDL Ratio: 3
Triglycerides: 75 mg/dL (ref 0.0–149.0)
VLDL: 15 mg/dL (ref 0.0–40.0)

## 2023-01-05 LAB — CBC WITH DIFFERENTIAL/PLATELET
Basophils Absolute: 0 10*3/uL (ref 0.0–0.1)
Basophils Relative: 0.8 % (ref 0.0–3.0)
Eosinophils Absolute: 0.3 10*3/uL (ref 0.0–0.7)
Eosinophils Relative: 5.3 % — ABNORMAL HIGH (ref 0.0–5.0)
HCT: 35 % — ABNORMAL LOW (ref 36.0–46.0)
Hemoglobin: 11 g/dL — ABNORMAL LOW (ref 12.0–15.0)
Lymphocytes Relative: 34.5 % (ref 12.0–46.0)
Lymphs Abs: 1.9 10*3/uL (ref 0.7–4.0)
MCHC: 31.3 g/dL (ref 30.0–36.0)
MCV: 80.6 fL (ref 78.0–100.0)
Monocytes Absolute: 0.4 10*3/uL (ref 0.1–1.0)
Monocytes Relative: 6.5 % (ref 3.0–12.0)
Neutro Abs: 2.9 10*3/uL (ref 1.4–7.7)
Neutrophils Relative %: 52.9 % (ref 43.0–77.0)
Platelets: 265 10*3/uL (ref 150.0–400.0)
RBC: 4.35 Mil/uL (ref 3.87–5.11)
RDW: 17.2 % — ABNORMAL HIGH (ref 11.5–15.5)
WBC: 5.6 10*3/uL (ref 4.0–10.5)

## 2023-01-05 LAB — HEMOGLOBIN A1C: Hgb A1c MFr Bld: 5.4 % (ref 4.6–6.5)

## 2023-01-05 LAB — SEDIMENTATION RATE: Sed Rate: 51 mm/h — ABNORMAL HIGH (ref 0–20)

## 2023-01-05 LAB — TSH: TSH: 25.23 u[IU]/mL — ABNORMAL HIGH (ref 0.35–5.50)

## 2023-01-05 MED ORDER — LEVOTHYROXINE SODIUM 150 MCG PO TABS
ORAL_TABLET | ORAL | 3 refills | Status: DC
Start: 2023-01-05 — End: 2023-04-30

## 2023-01-05 MED ORDER — MELOXICAM 7.5 MG PO TABS: 7.5000 mg | ORAL_TABLET | Freq: Two times a day (BID) | ORAL | 0 refills | Status: DC

## 2023-01-05 MED ORDER — MONTELUKAST SODIUM 10 MG PO TABS
10.0000 mg | ORAL_TABLET | Freq: Every day | ORAL | 0 refills | Status: DC
Start: 2023-01-05 — End: 2023-05-10

## 2023-01-05 MED ORDER — LEVOTHYROXINE SODIUM 175 MCG PO TABS
ORAL_TABLET | ORAL | Status: DC
Start: 2023-01-05 — End: 2023-04-30

## 2023-01-05 MED ORDER — BUDESONIDE-FORMOTEROL FUMARATE 160-4.5 MCG/ACT IN AERO: 2.0000 | INHALATION_SPRAY | Freq: Two times a day (BID) | RESPIRATORY_TRACT | 3 refills | Status: DC

## 2023-01-05 MED ORDER — PANTOPRAZOLE SODIUM 40 MG PO TBEC
40.0000 mg | DELAYED_RELEASE_TABLET | Freq: Every day | ORAL | 3 refills | Status: DC
Start: 2023-01-05 — End: 2023-04-30

## 2023-01-05 MED ORDER — FLUOXETINE HCL 20 MG PO TABS
20.0000 mg | ORAL_TABLET | Freq: Every day | ORAL | 3 refills | Status: DC
Start: 2023-01-05 — End: 2023-09-11

## 2023-01-05 MED ORDER — PREDNISONE 10 MG (21) PO TBPK: ORAL_TABLET | ORAL | 0 refills | Status: DC

## 2023-01-05 MED ORDER — CYCLOBENZAPRINE HCL 10 MG PO TABS
10.0000 mg | ORAL_TABLET | Freq: Three times a day (TID) | ORAL | 0 refills | Status: DC | PRN
Start: 2023-01-05 — End: 2023-12-13

## 2023-01-05 MED ORDER — SPIRIVA RESPIMAT 2.5 MCG/ACT IN AERS
2.0000 | INHALATION_SPRAY | Freq: Every day | RESPIRATORY_TRACT | 5 refills | Status: DC
Start: 2023-01-05 — End: 2023-04-30

## 2023-01-05 MED ORDER — GABAPENTIN 300 MG PO CAPS
300.0000 mg | ORAL_CAPSULE | Freq: Every day | ORAL | 0 refills | Status: DC | PRN
Start: 2023-01-05 — End: 2023-04-02

## 2023-01-05 MED ORDER — ALBUTEROL SULFATE HFA 108 (90 BASE) MCG/ACT IN AERS
INHALATION_SPRAY | RESPIRATORY_TRACT | 0 refills | Status: DC
Start: 2023-01-05 — End: 2023-02-26

## 2023-01-05 NOTE — Patient Instructions (Signed)
  A referral was placed today for pain management.  Please let us know if you have not heard back within 2 weeks about the referral.  CALL PULMONARY TO FOLLOW UP ASAP

## 2023-01-05 NOTE — Progress Notes (Signed)
Established Patient Office Visit  Subjective:      CC:  Chief Complaint  Patient presents with   Medical Management of Chronic Issues    HPI: [name] is a 47 y.o. female presenting on 01/05/2023 for Medical Management of Chronic Issues . COPD, was supposed to f/u with pulmonary in three months from 12/02/21 appt. Did not follow up. Was told to stop breztri and resume symbicort 80 mcg 2 puffs twice daily and started on spiriva two puffs QAM. She does state she has been out of her medications (especially all of her inhalers) for about one year. She is wheezing all of the time with increased sob and DOE. No chest pain. Trouble breathing at night especially. Has some nebulizer medication and has used with slight improvement when it is really bad. Still does use 3L oxygen at night time.   Wt Readings from Last 3 Encounters:  01/08/23 194 lb (88 kg)  01/05/23 193 lb 6.4 oz (87.7 kg)  09/08/22 220 lb (99.8 kg)   She does state that she restarted smoking, tried to take nicotine gum but it made her stomach feel like it was burning. She quit with the gum before just could not tolerate this time.   New complaints: Chronic low back pain with bil sciatica, lately can not bend over fully without muscle spasming. She is taking flexeril taking at night with mild relief and tylenol 500 mg rapid release with some slight improvement. About two months ago starting flaring up and has since been daily. CT lumbar spine 09/19/19 no acute findings, however chronic spondylosis L4-L5 mild narrowing right lateral recess and both foramina. She did see urgent care for orthopedist and  She states in the past was told she had bulging discs, and 'might need surgery' but this was about 2 years ago.   Still with ongoing polyarthralgia, stiffness in the am.  Mom has RA.     Social history:  Relevant past medical, surgical, family and social history reviewed and updated as indicated. Interim medical  history since our last visit reviewed.  Allergies and medications reviewed and updated.  DATA REVIEWED: CHART IN EPIC     ROS: Negative unless specifically indicated above in HPI.    Current Outpatient Medications:    ALPRAZolam (XANAX) 0.25 MG tablet, Take 1 tablet (0.25 mg total) by mouth 2 (two) times daily as needed for anxiety or sleep., Disp: 30 tablet, Rfl: 0   budesonide-formoterol (SYMBICORT) 160-4.5 MCG/ACT inhaler, Inhale 2 puffs into the lungs 2 (two) times daily., Disp: 1 each, Rfl: 3   cyclobenzaprine (FLEXERIL) 10 MG tablet, Take 1 tablet (10 mg total) by mouth 3 (three) times daily as needed for muscle spasms., Disp: 30 tablet, Rfl: 0   FLUoxetine (PROZAC) 20 MG tablet, Take 1 tablet (20 mg total) by mouth daily., Disp: 90 tablet, Rfl: 3   predniSONE (STERAPRED UNI-PAK 21 TAB) 10 MG (21) TBPK tablet, Take as directed, Disp: 1 each, Rfl: 0   albuterol (VENTOLIN HFA) 108 (90 Base) MCG/ACT inhaler, INHALE 1 TO 2 PUFFS BY MOUTH EVERY 6 HOURS AS NEEDED FOR WHEEZING OR SHORTNESS OF BREATH, Disp: 18 g, Rfl: 0   gabapentin (NEURONTIN) 300 MG capsule, Take 1 capsule (300 mg total) by mouth daily as needed (pain)., Disp: 90 capsule, Rfl: 0   levothyroxine (SYNTHROID) 150 MCG tablet, Take 175 mg tablet daily on Saturday and Sunday every week, take 150 mg tablet once daily Monday through Friday every week and repeat  therafter, Disp: 90 tablet, Rfl: 3   levothyroxine (SYNTHROID) 175 MCG tablet, Take 175 mg tablet daily on Saturday and Sunday every week, take 150 mg tablet once daily Monday through Friday every week and repeat therafter, Disp: , Rfl:    meloxicam (MOBIC) 7.5 MG tablet, Take 1 tablet (7.5 mg total) by mouth 2 (two) times daily., Disp: 180 tablet, Rfl: 0   montelukast (SINGULAIR) 10 MG tablet, Take 1 tablet (10 mg total) by mouth at bedtime., Disp: 30 tablet, Rfl: 0   oxyCODONE-acetaminophen (PERCOCET/ROXICET) 5-325 MG tablet, Take 1 tablet by mouth every 6 (six) hours as  needed for severe pain (pain score 7-10)., Disp: 20 tablet, Rfl: 0   pantoprazole (PROTONIX) 40 MG tablet, Take 1 tablet (40 mg total) by mouth daily., Disp: 90 tablet, Rfl: 3   predniSONE (DELTASONE) 10 MG tablet, Take 2 tablets (20 mg total) by mouth daily., Disp: 14 tablet, Rfl: 0   Tiotropium Bromide Monohydrate (SPIRIVA RESPIMAT) 2.5 MCG/ACT AERS, Inhale 2 puffs into the lungs daily., Disp: 1 each, Rfl: 5  Current Facility-Administered Medications:    ipratropium-albuterol (DUONEB) 0.5-2.5 (3) MG/3ML nebulizer solution 3 mL, 3 mL, Nebulization, Q6H, Glenford Bayley, NP, 3 mL at 11/17/21 1357      Objective:    BP 130/74 (BP Location: Left Arm, Patient Position: Sitting, Cuff Size: Normal)   Pulse 70   Temp (!) 97.3 F (36.3 C) (Temporal)   Ht 5\' 4"  (1.626 m)   Wt 193 lb 6.4 oz (87.7 kg)   SpO2 97%   BMI 33.20 kg/m   Wt Readings from Last 3 Encounters:  01/08/23 194 lb (88 kg)  01/05/23 193 lb 6.4 oz (87.7 kg)  09/08/22 220 lb (99.8 kg)    Physical Exam Constitutional:      General: She is not in acute distress.    Appearance: Normal appearance. She is normal weight. She is not ill-appearing, toxic-appearing or diaphoretic.  HENT:     Head: Normocephalic.     Right Ear: Tympanic membrane normal.     Left Ear: Tympanic membrane normal.     Nose: Nose normal.     Mouth/Throat:     Mouth: Mucous membranes are dry.     Pharynx: No oropharyngeal exudate or posterior oropharyngeal erythema.  Eyes:     Extraocular Movements: Extraocular movements intact.     Pupils: Pupils are equal, round, and reactive to light.  Cardiovascular:     Rate and Rhythm: Normal rate and regular rhythm.     Pulses: Normal pulses.     Heart sounds: Normal heart sounds.  Pulmonary:     Effort: Accessory muscle usage present. No prolonged expiration or respiratory distress.     Breath sounds: Decreased air movement present. Examination of the right-upper field reveals decreased breath sounds  and wheezing. Examination of the left-upper field reveals decreased breath sounds and wheezing. Examination of the right-middle field reveals decreased breath sounds and wheezing. Examination of the left-middle field reveals decreased breath sounds and wheezing. Examination of the right-lower field reveals decreased breath sounds and wheezing. Examination of the left-lower field reveals decreased breath sounds and wheezing. Decreased breath sounds and wheezing present.  Musculoskeletal:     Cervical back: Normal range of motion.     Lumbar back: Spasms and tenderness present. No swelling or signs of trauma. Decreased range of motion. Positive right straight leg raise test.  Neurological:     General: No focal deficit present.     Mental  Status: She is alert and oriented to person, place, and time. Mental status is at baseline.  Psychiatric:        Mood and Affect: Mood normal.        Behavior: Behavior normal.        Thought Content: Thought content normal.        Judgment: Judgment normal.           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  Dispense: 1 each; Refill: 3 -     Spiriva Respimat; Inhale 2 puffs into the lungs daily.  Dispense: 1 each; Refill: 5 -     Montelukast Sodium; Take 1 tablet (10 mg total) by mouth at bedtime.  Dispense: 30 tablet; Refill: 0  COPD exacerbation (HCC) Assessment & Plan: Non compliant due to insurance status now with insurance  Restart inhalers, rx sent in for budesonide formoterol, spiriva and montelukast. Albuterol prn. RX prednisone for exacerbation. Red flag symptoms discussed.   Orders: -     Budesonide-Formoterol Fumarate; Inhale 2 puffs into the lungs 2 (two) times daily.  Dispense: 1 each; Refill: 3 -     Spiriva Respimat; Inhale 2 puffs into the lungs daily.  Dispense: 1 each; Refill: 5 -     CBC with  Differential/Platelet -     predniSONE; Take as directed  Dispense: 1 each; Refill: 0  Chronic bilateral low back pain with bilateral sciatica Assessment & Plan: Refill gabapentin 300 mg po at bedtime.  Referral placed for pain clinic, pt request for this referral.    Orders: -     Gabapentin; Take 1 capsule (300 mg total) by mouth daily as needed (pain).  Dispense: 90 capsule; Refill: 0 -     Ambulatory referral to Pain Clinic  Osteoarthritis, unspecified osteoarthritis type, unspecified site Assessment & Plan: Refill mobic 7.5 mg once daily.   Orders: -     Meloxicam; Take 1 tablet (7.5 mg total) by mouth 2 (two) times daily.  Dispense: 180 tablet; Refill: 0  Gastroesophageal reflux disease, unspecified whether esophagitis present Assessment & Plan: Try to decrease and or avoid spicy foods, fried fatty foods, and also caffeine and chocolate as these can increase heartburn symptoms.  Refill sent for panotprazole 40 mg once daily.  Orders: -     Pantoprazole Sodium; Take 1 tablet (40 mg total) by mouth daily.  Dispense: 90 tablet; Refill: 3  Acute bronchitis, unspecified organism -     Albuterol Sulfate HFA; INHALE 1 TO 2 PUFFS BY MOUTH EVERY 6 HOURS AS NEEDED FOR WHEEZING OR SHORTNESS OF BREATH  Dispense: 18 g; Refill: 0  Hypothyroidism, unspecified type Assessment & Plan: Tsh ordered pending results.  Refill sent for levothyroxine 175 mcg as directed and 150 mcg as directed.  Orders: -     TSH -     Levothyroxine Sodium; Take 175 mg tablet daily on Saturday and Sunday every week, take 150 mg tablet once daily Monday through Friday every week and repeat therafter -     Levothyroxine Sodium; Take 175 mg tablet daily on Saturday and Sunday every week, take 150 mg tablet once daily Monday through Friday every week and repeat therafter  Dispense: 90 tablet; Refill: 3  Tobacco abuse Assessment & Plan: Smoking cessation instruction/counseling given:  counseled patient on the  dangers of tobacco use, advised patient to stop  smoking, and reviewed strategies to maximize success    Vitamin D deficiency  Snoring -     Ambulatory referral to Sleep Studies  Hyperglycemia Assessment & Plan: Pt advised of the following: Work on a diabetic diet, try to incorporate exercise at least 20-30 a day for 3 days a week or more.  A1c ordered pending results.    Orders: -     Basic metabolic panel -     Hemoglobin A1c  Hypokalemia  Major depressive disorder, remission status unspecified, unspecified whether recurrent Assessment & Plan: Restart prozac, start at 20 mg once daily.  Orders: -     FLUoxetine HCl; Take 1 tablet (20 mg total) by mouth daily.  Dispense: 90 tablet; Refill: 3  History of alcohol dependence (HCC)  Hypoxia, sleep related  Spondylosis -     Cyclobenzaprine HCl; Take 1 tablet (10 mg total) by mouth 3 (three) times daily as needed for muscle spasms.  Dispense: 30 tablet; Refill: 0  Elevated LDL cholesterol level -     Lipid panel  Polyarthralgia -     ANA w/Reflex -     Sedimentation rate -     Rheumatoid factor  Family history of rheumatoid arthritis  Pulmonary arterial hypertension (HCC)  Long-term current use of proton pump inhibitor therapy  Other orders -     ENA+DNA/DS+Sjorgen's   Worsening joint pains and stiffness in the am, Autoimmune workup in place.   Return in about 3 months (around 04/07/2023) for follow up thyroid.  Mort Sawyers, MSN, APRN, FNP-C [facility]

## 2023-01-06 LAB — RHEUMATOID FACTOR: Rheumatoid fact SerPl-aCnc: <10 [IU]/mL (ref <14)

## 2023-01-08 ENCOUNTER — Encounter (HOSPITAL_COMMUNITY): Payer: Self-pay | Admitting: *Deleted

## 2023-01-08 ENCOUNTER — Encounter (HOSPITAL_BASED_OUTPATIENT_CLINIC_OR_DEPARTMENT_OTHER): Payer: Self-pay | Admitting: Student

## 2023-01-08 ENCOUNTER — Other Ambulatory Visit: Payer: Self-pay

## 2023-01-08 ENCOUNTER — Other Ambulatory Visit: Payer: Self-pay | Admitting: Family

## 2023-01-08 ENCOUNTER — Emergency Department (HOSPITAL_COMMUNITY): Payer: BC Managed Care – PPO

## 2023-01-08 ENCOUNTER — Ambulatory Visit (HOSPITAL_BASED_OUTPATIENT_CLINIC_OR_DEPARTMENT_OTHER): Payer: BC Managed Care – PPO | Admitting: Student

## 2023-01-08 ENCOUNTER — Emergency Department (HOSPITAL_COMMUNITY)
Admission: EM | Admit: 2023-01-08 | Discharge: 2023-01-08 | Disposition: A | Discharge status: Home / Self Care | Payer: BC Managed Care – PPO | Attending: Emergency Medicine | Admitting: Emergency Medicine

## 2023-01-08 DIAGNOSIS — R768 Other specified abnormal immunological findings in serum: Secondary | ICD-10-CM

## 2023-01-08 DIAGNOSIS — M5441 Lumbago with sciatica, right side: Secondary | ICD-10-CM | POA: Insufficient documentation

## 2023-01-08 DIAGNOSIS — M5442 Lumbago with sciatica, left side: Secondary | ICD-10-CM | POA: Diagnosis not present

## 2023-01-08 DIAGNOSIS — M545 Low back pain, unspecified: Secondary | ICD-10-CM | POA: Diagnosis present

## 2023-01-08 DIAGNOSIS — Z8261 Family history of arthritis: Secondary | ICD-10-CM

## 2023-01-08 DIAGNOSIS — M5440 Lumbago with sciatica, unspecified side: Secondary | ICD-10-CM

## 2023-01-08 DIAGNOSIS — M2550 Pain in unspecified joint: Secondary | ICD-10-CM

## 2023-01-08 LAB — URINALYSIS, ROUTINE W REFLEX MICROSCOPIC
Bilirubin Urine: NEGATIVE
Glucose, UA: NEGATIVE mg/dL
Ketones, ur: NEGATIVE mg/dL
Leukocytes,Ua: NEGATIVE
Nitrite: NEGATIVE
Protein, ur: NEGATIVE mg/dL
Specific Gravity, Urine: 1.008 (ref 1.005–1.030)
pH: 6 (ref 5.0–8.0)

## 2023-01-08 LAB — ENA+DNA/DS+SJORGEN'S
ENA RNP Ab: 0.2 AI (ref 0.0–0.9)
ENA SM Ab Ser-aCnc: <0.2 AI (ref 0.0–0.9)
ENA SSA (RO) Ab: 0.2 AI (ref 0.0–0.9)
ENA SSB (LA) Ab: <0.2 AI (ref 0.0–0.9)
dsDNA Ab: 89 [IU]/mL — ABNORMAL HIGH (ref 0–9)

## 2023-01-08 LAB — ANA W/REFLEX: Anti Nuclear Antibody (ANA): POSITIVE — AB

## 2023-01-08 MED ORDER — HYDROMORPHONE HCL 1 MG/ML IJ SOLN
1.0000 mg | Freq: Once | INTRAMUSCULAR | Status: AC
  Administered 2023-01-08: 1 mg via INTRAVENOUS
  Filled 2023-01-08: qty 1

## 2023-01-08 MED ORDER — ONDANSETRON HCL 4 MG/2ML IJ SOLN
4.0000 mg | Freq: Once | INTRAMUSCULAR | Status: AC
  Administered 2023-01-08: 4 mg via INTRAVENOUS
  Filled 2023-01-08: qty 2

## 2023-01-08 MED ORDER — OXYCODONE-ACETAMINOPHEN 5-325 MG PO TABS: 1.0000 | ORAL_TABLET | Freq: Four times a day (QID) | ORAL | 0 refills | Status: DC | PRN

## 2023-01-08 MED ORDER — ALBUTEROL SULFATE HFA 108 (90 BASE) MCG/ACT IN AERS
2.0000 | INHALATION_SPRAY | Freq: Once | RESPIRATORY_TRACT | Status: AC
  Administered 2023-01-08: 2 via RESPIRATORY_TRACT
  Filled 2023-01-08: qty 6.7

## 2023-01-08 MED ORDER — METHYLPREDNISOLONE SODIUM SUCC 125 MG IJ SOLR
125.0000 mg | Freq: Once | INTRAMUSCULAR | Status: AC
  Administered 2023-01-08: 125 mg via INTRAVENOUS
  Filled 2023-01-08: qty 2

## 2023-01-08 MED ORDER — PREDNISONE 10 MG PO TABS: 20.0000 mg | ORAL_TABLET | Freq: Every day | ORAL | 0 refills | Status: DC

## 2023-01-08 NOTE — Discharge Instructions (Signed)
Follow-up with Dr. Dutch Quint or one of his associates next week.  If you get worse you should go to [facility] in [place] because that is for Dr. Dutch Quint works

## 2023-01-08 NOTE — Progress Notes (Signed)
Chief Complaint: Low back pain     History of Present Illness:    [name] is a 47 y.o. female presenting today with acute low back pain.  She denies any recent injury but has had a previous lumbar surgery in 2000.  Patient reports that pain has developed over the last few days but became extremely severe this morning.  She describes pain, numbness, and tingling radiating all the way down the left leg into the foot.  Pain is constant, and significantly worsened with sitting, but she is better in the supine and standing position.  She was seen in the ED this morning who did complete an MRI as well as send her with prednisone and Percocet.  Note from the ED does suggest pain radiating into the right thigh.  No saddle anesthesia or bowel/bladder dysfunction.   Surgical History:   History of surgery for ruptured disc in lumbar spine around 2000  PMH/PSH/Family History/Social History/Meds/Allergies:    Past Medical History:  Diagnosis Date   Alcohol abuse    Anemia 01/31/2011   Asthma    COPD (chronic obstructive pulmonary disease) (HCC)    Depression    Hepatitis C 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 10/24/2021   RNA negative.  Hep C false positive.     Hypoxia 02/01/2011   Medical history non-contributory    Needs sleep apnea assessment 2013.07.31   Tobacco abuse    Past Surgical History:  Procedure Laterality Date   ANKLE GANGLION CYST EXCISION     BACK SURGERY     CESAREAN SECTION     COLONOSCOPY WITH PROPOFOL N/A 06/27/2016   Procedure: COLONOSCOPY WITH PROPOFOL;  Surgeon: Wyline Mood, MD;  Location: [facility] ENDOSCOPY;  Service: Endoscopy;  Laterality: N/A;   DILATION AND CURETTAGE OF UTERUS     ESOPHAGOGASTRODUODENOSCOPY (EGD) WITH PROPOFOL N/A 06/27/2016   Procedure: ESOPHAGOGASTRODUODENOSCOPY (EGD) WITH PROPOFOL;  Surgeon: Wyline Mood, MD;  Location: [facility] ENDOSCOPY;  Service: Endoscopy;  Laterality: N/A;   Social History   Socioeconomic  History   Marital status: Single    Spouse name: Not on file   Number of children: 1   Years of education: Not on file   Highest education level: Not on file  Occupational History    Employer: [employer]  Tobacco Use   Smoking status: Every Day    Current packs/day: 0.00    Average packs/day: 0.5 packs/day for 25.0 years (12.5 ttl pk-yrs)    Types: Cigarettes    Start date: 11/11/1996    Last attempt to quit: 11/11/2021    Years since quitting: 1.1   Smokeless tobacco: Never   Tobacco comments:    Pt states she stopped smoking when she got out of hospital. ALS 10/20  Vaping Use   Vaping status: Former  Substance and Sexual Activity   Alcohol use: Yes    Comment: occasionally   Drug use: Not Currently    Types: Marijuana, Cocaine    Comment: last >2 y/o   Sexual activity: Not Currently    Partners: Female    Comment: Homosexual  Other Topics Concern   Not on file  Social History Narrative   Son, 26 y/o    Social Determinants of Health   Financial Resource Strain: Not on file  Food Insecurity: No Food Insecurity (02/08/2022)  Hunger Vital Sign    Worried About Running Out of Food in the Last Year: Never true    Ran Out of Food in the Last Year: Never true  Transportation Needs: No Transportation Needs (02/08/2022)   PRAPARE - Administrator, Civil Service (Medical): No    Lack of Transportation (Non-Medical): No  Physical Activity: Not on file  Stress: Not on file  Social Connections: Not on file   Family History  Problem Relation Age of Onset   Stroke Mother    Hypothyroidism Mother    Coronary artery disease Mother    Throat cancer Mother    Stroke Maternal Grandmother    Diabetes Maternal Grandmother    COPD Paternal Grandmother    No Known Allergies Current Outpatient Medications  Medication Sig Dispense Refill   albuterol (VENTOLIN HFA) 108 (90 Base) MCG/ACT inhaler INHALE 1 TO 2 PUFFS BY MOUTH EVERY 6 HOURS AS NEEDED FOR WHEEZING  OR SHORTNESS OF BREATH 18 g 0   ALPRAZolam (XANAX) 0.25 MG tablet Take 1 tablet (0.25 mg total) by mouth 2 (two) times daily as needed for anxiety or sleep. 30 tablet 0   budesonide-formoterol (SYMBICORT) 160-4.5 MCG/ACT inhaler Inhale 2 puffs into the lungs 2 (two) times daily. 1 each 3   cyclobenzaprine (FLEXERIL) 10 MG tablet Take 1 tablet (10 mg total) by mouth 3 (three) times daily as needed for muscle spasms. 30 tablet 0   FLUoxetine (PROZAC) 20 MG tablet Take 1 tablet (20 mg total) by mouth daily. 90 tablet 3   gabapentin (NEURONTIN) 300 MG capsule Take 1 capsule (300 mg total) by mouth daily as needed (pain). 90 capsule 0   levothyroxine (SYNTHROID) 150 MCG tablet Take 175 mg tablet daily on Saturday and Sunday every week, take 150 mg tablet once daily Monday through Friday every week and repeat therafter 90 tablet 3   levothyroxine (SYNTHROID) 175 MCG tablet Take 175 mg tablet daily on Saturday and Sunday every week, take 150 mg tablet once daily Monday through Friday every week and repeat therafter     meloxicam (MOBIC) 7.5 MG tablet Take 1 tablet (7.5 mg total) by mouth 2 (two) times daily. 180 tablet 0   montelukast (SINGULAIR) 10 MG tablet Take 1 tablet (10 mg total) by mouth at bedtime. 30 tablet 0   oxyCODONE-acetaminophen (PERCOCET/ROXICET) 5-325 MG tablet Take 1 tablet by mouth every 6 (six) hours as needed for severe pain (pain score 7-10). 20 tablet 0   pantoprazole (PROTONIX) 40 MG tablet Take 1 tablet (40 mg total) by mouth daily. 90 tablet 3   predniSONE (DELTASONE) 10 MG tablet Take 2 tablets (20 mg total) by mouth daily. 14 tablet 0   predniSONE (STERAPRED UNI-PAK 21 TAB) 10 MG (21) TBPK tablet Take as directed 1 each 0   Tiotropium Bromide Monohydrate (SPIRIVA RESPIMAT) 2.5 MCG/ACT AERS Inhale 2 puffs into the lungs daily. 1 each 5   Current Facility-Administered Medications  Medication Dose Route Frequency Provider Last Rate Last Admin   ipratropium-albuterol (DUONEB)  0.5-2.5 (3) MG/3ML nebulizer solution 3 mL  3 mL Nebulization Q6H Glenford Bayley, NP   3 mL at 11/17/21 1357   MR LUMBAR SPINE WO CONTRAST  Result Date: 01/08/2023 CLINICAL DATA:  Low back pain, infection suspected, positive xray/CT. EXAM: MRI LUMBAR SPINE WITHOUT CONTRAST TECHNIQUE: Multiplanar, multisequence MR imaging of the lumbar spine was performed. No intravenous contrast was administered. COMPARISON:  MRI lumbar spine 07/04/2005. CT lumbar spine 09/19/2019. FINDINGS: Segmentation:  Conventional numbering is assumed with 5 non-rib-bearing, lumbar type vertebral bodies. Diminutive ribs at T12. Alignment:  Trace retrolisthesis of L3 on L4. Vertebrae: Modic type 2 degenerative endplate marrow signal changes at L4-5. Normal vertebral body heights. No suspicious marrow lesions or evidence of infection. Conus medullaris and cauda equina: Conus extends to the L1 level. Conus and cauda equina appear normal. Paraspinal and other soft tissues: Mild fatty atrophy of the multifidus muscles. Disc levels: T12-L1:  Normal. L1-L2: Small disc bulge without spinal canal stenosis or neural foraminal narrowing. L2-L3: Small disc bulge and mild bilateral facet arthropathy. No spinal canal stenosis or neural foraminal narrowing. L3-L4: Right eccentric disc bulge and facet arthropathy results in displacement of the traversing right L4 nerve root in the subarticular zone. L4-L5: Prior right hemilaminotomy. No spinal canal stenosis. Moderate disc height loss and right-greater-than-left facet arthropathy results in moderate right neural foraminal narrowing. L5-S1: Small right central disc protrusion. No spinal canal stenosis. Right-greater-than-left facet arthropathy without significant neural foraminal narrowing. IMPRESSION: 1. No evidence of discitis-osteomyelitis. 2. Prior right hemilaminotomy at L4-5 with moderate right neural foraminal narrowing. 3. Right eccentric disc bulge and facet arthropathy at L3-4 results in  displacement of the traversing right L4 nerve root in the subarticular zone. Electronically Signed   By: Orvan Falconer [name]   On: 01/08/2023 10:49    Review of Systems:   A ROS was performed including pertinent positives and negatives as documented in the HPI.  Physical Exam :   Constitutional: NAD and appears stated age Neurological: Alert and oriented Psych: Appropriate affect and cooperative Last menstrual period 08/22/2022.   Comprehensive Musculoskeletal Exam:    Tenderness to palpation over bilateral sides of the lower lumbar spine.  Bilateral passive hip ROM to 110 degrees flexion and 20 degrees ER and IR.  Strength with knee flexion/extension and ankle dorsiflexion/plantarflexion is 5/5.  Patellar reflexes 2+ and equal bilaterally.  Imaging:   MRI (lumbar spine): Right-sided disc bulge of L3-L4, small multilevel disc bulging without notable spinal stenosis, and facet arthropathy noted at the L4-L5 and L5-S1 levels.   I personally reviewed and interpreted the radiographs.   Assessment:   47 y.o. female with acute bilateral low back pain and sciatica of the left side.  No symptoms concerning for cauda equina.  MRI taken today at the ED does show a disc protrusion of the right side although she reports pain mainly down the left leg to the foot.  Given the appearance of the MRI as well as previous surgical history I would like to to refer her to our spine surgeon Dr. Christell Constant for further discussion of management.  Her pain is much more controlled on current prednisone and Percocets will continue this regimen although encouraged to transition to NSAIDs when able.  Plan :    -Referral to Dr. Christell Constant for further evaluation and management     I personally saw and evaluated the patient, and participated in the management and treatment plan.  Hazle Nordmann, PA-C Orthopedics

## 2023-01-08 NOTE — ED Notes (Signed)
Patient transported to MRI 

## 2023-01-08 NOTE — ED Provider Notes (Signed)
[place] EMERGENCY DEPARTMENT AT [facility] Provider Note   CSN: [id number] Arrival date & time: 01/08/23  0830     History  Chief Complaint  Patient presents with   Back Pain    [name] is a 47 y.o. female.  Patient has a history of previous back surgery.  She is complaining of severe back pain radiating down her right leg  The history is provided by the patient and medical records. No language interpreter was used.  Back Pain Location:  Sacro-iliac joint Quality:  Aching Radiates to:  R thigh Pain severity:  Moderate Pain is:  Worse during the day Onset quality:  Sudden Timing:  Constant Progression:  Worsening Chronicity:  New Relieved by:  Nothing Worsened by:  Nothing Ineffective treatments:  None tried Associated symptoms: no abdominal pain, no chest pain and no headaches        Home Medications Prior to Admission medications   Medication Sig Start Date End Date Taking? Authorizing Provider  albuterol (VENTOLIN HFA) 108 (90 Base) MCG/ACT inhaler INHALE 1 TO 2 PUFFS BY MOUTH EVERY 6 HOURS AS NEEDED FOR WHEEZING OR SHORTNESS OF BREATH 01/05/23  Yes Dugal, [name], FNP  ALPRAZolam (XANAX) 0.25 MG tablet Take 1 tablet (0.25 mg total) by mouth 2 (two) times daily as needed for anxiety or sleep. 02/21/22  Yes Dugal, Wyatt Mage, FNP  cyclobenzaprine (FLEXERIL) 10 MG tablet Take 1 tablet (10 mg total) by mouth 3 (three) times daily as needed for muscle spasms. 01/05/23  Yes Mort Sawyers, FNP  oxyCODONE-acetaminophen (PERCOCET/ROXICET) 5-325 MG tablet Take 1 tablet by mouth every 6 (six) hours as needed for severe pain (pain score 7-10). 01/08/23  Yes Bethann Berkshire, MD  predniSONE (DELTASONE) 10 MG tablet Take 2 tablets (20 mg total) by mouth daily. 01/08/23  Yes Bethann Berkshire, MD  budesonide-formoterol [facility]) 160-4.5 MCG/ACT inhaler Inhale 2 puffs into the lungs 2 (two) times daily. 01/05/23   Mort Sawyers, FNP  FLUoxetine (PROZAC) 20 MG  tablet Take 1 tablet (20 mg total) by mouth daily. 01/05/23   Mort Sawyers, FNP  gabapentin (NEURONTIN) 300 MG capsule Take 1 capsule (300 mg total) by mouth daily as needed (pain). 01/05/23 04/05/23  Mort Sawyers, FNP  levothyroxine (SYNTHROID) 150 MCG tablet Take 175 mg tablet daily on Saturday and Sunday every week, take 150 mg tablet once daily Monday through Friday every week and repeat therafter 01/05/23   Mort Sawyers, FNP  levothyroxine (SYNTHROID) 175 MCG tablet Take 175 mg tablet daily on Saturday and Sunday every week, take 150 mg tablet once daily Monday through Friday every week and repeat therafter 01/05/23   Mort Sawyers, FNP  meloxicam (MOBIC) 7.5 MG tablet Take 1 tablet (7.5 mg total) by mouth 2 (two) times daily. 01/05/23   Mort Sawyers, FNP  montelukast (SINGULAIR) 10 MG tablet Take 1 tablet (10 mg total) by mouth at bedtime. 01/05/23   Mort Sawyers, FNP  pantoprazole (PROTONIX) 40 MG tablet Take 1 tablet (40 mg total) by mouth daily. 01/05/23   Mort Sawyers, FNP  predniSONE (STERAPRED UNI-PAK 21 TAB) 10 MG (21) TBPK tablet Take as directed 01/05/23   Mort Sawyers, FNP  Tiotropium Bromide Monohydrate (SPIRIVA RESPIMAT) 2.5 MCG/ACT AERS Inhale 2 puffs into the lungs daily. 01/05/23   Mort Sawyers, FNP      Allergies    Patient has no known allergies.    Review of Systems   Review of Systems  Constitutional:  Negative for appetite change and fatigue.  HENT:  Negative for congestion, ear discharge and sinus pressure.   Eyes:  Negative for discharge.  Respiratory:  Negative for cough.   Cardiovascular:  Negative for chest pain.  Gastrointestinal:  Negative for abdominal pain and diarrhea.  Genitourinary:  Negative for frequency and hematuria.  Musculoskeletal:  Positive for back pain.  Skin:  Negative for rash.  Neurological:  Negative for seizures and headaches.  Psychiatric/Behavioral:  Negative for hallucinations.     Physical Exam Updated Vital  Signs BP (!) 127/98 (BP Location: Right Arm)   Pulse 76   Temp (!) 97.3 F (36.3 C) (Oral)   Resp (!) 22   Ht 5\' 4"  (1.626 m)   Wt 88 kg   LMP 08/22/2022 (Approximate)   SpO2 99%   BMI 33.30 kg/m  Physical Exam Vitals and nursing note reviewed.  Constitutional:      Appearance: She is well-developed.  HENT:     Head: Normocephalic.     Mouth/Throat:     Mouth: Mucous membranes are moist.  Eyes:     General: No scleral icterus.    Conjunctiva/sclera: Conjunctivae normal.  Neck:     Thyroid: No thyromegaly.  Cardiovascular:     Rate and Rhythm: Normal rate and regular rhythm.     Heart sounds: No murmur heard.    No friction rub. No gallop.  Pulmonary:     Breath sounds: No stridor. No wheezing or rales.  Chest:     Chest wall: No tenderness.  Abdominal:     General: There is no distension.     Tenderness: There is no abdominal tenderness. There is no rebound.  Musculoskeletal:        General: Normal range of motion.     Cervical back: Neck supple.     Comments: Tender lumbar spine with positive straight leg raise  Lymphadenopathy:     Cervical: No cervical adenopathy.  Skin:    Findings: No erythema or rash.  Neurological:     Mental Status: She is alert and oriented to person, place, and time.     Motor: No abnormal muscle tone.     Coordination: Coordination normal.  Psychiatric:        Behavior: Behavior normal.     ED Results / Procedures / Treatments   Labs (all labs ordered are listed, but only abnormal results are displayed) Labs Reviewed  URINALYSIS, ROUTINE W REFLEX MICROSCOPIC - Abnormal; Notable for the following components:      Result Value   APPearance HAZY (*)    Hgb urine dipstick SMALL (*)    Bacteria, UA RARE (*)    All other components within normal limits    EKG None  Radiology MR LUMBAR SPINE WO CONTRAST  Result Date: 01/08/2023 CLINICAL DATA:  Low back pain, infection suspected, positive xray/CT. EXAM: MRI LUMBAR SPINE  WITHOUT CONTRAST TECHNIQUE: Multiplanar, multisequence MR imaging of the lumbar spine was performed. No intravenous contrast was administered. COMPARISON:  MRI lumbar spine 07/04/2005. CT lumbar spine 09/19/2019. FINDINGS: Segmentation: Conventional numbering is assumed with 5 non-rib-bearing, lumbar type vertebral bodies. Diminutive ribs at T12. Alignment:  Trace retrolisthesis of L3 on L4. Vertebrae: Modic type 2 degenerative endplate marrow signal changes at L4-5. Normal vertebral body heights. No suspicious marrow lesions or evidence of infection. Conus medullaris and cauda equina: Conus extends to the L1 level. Conus and cauda equina appear normal. Paraspinal and other soft tissues: Mild fatty atrophy of the multifidus muscles. Disc levels: T12-L1:  Normal. L1-L2:  Small disc bulge without spinal canal stenosis or neural foraminal narrowing. L2-L3: Small disc bulge and mild bilateral facet arthropathy. No spinal canal stenosis or neural foraminal narrowing. L3-L4: Right eccentric disc bulge and facet arthropathy results in displacement of the traversing right L4 nerve root in the subarticular zone. L4-L5: Prior right hemilaminotomy. No spinal canal stenosis. Moderate disc height loss and right-greater-than-left facet arthropathy results in moderate right neural foraminal narrowing. L5-S1: Small right central disc protrusion. No spinal canal stenosis. Right-greater-than-left facet arthropathy without significant neural foraminal narrowing. IMPRESSION: 1. No evidence of discitis-osteomyelitis. 2. Prior right hemilaminotomy at L4-5 with moderate right neural foraminal narrowing. 3. Right eccentric disc bulge and facet arthropathy at L3-4 results in displacement of the traversing right L4 nerve root in the subarticular zone. Electronically Signed   By: Orvan Falconer [name]   On: 01/08/2023 10:49    Procedures Procedures    Medications Ordered in ED Medications  methylPREDNISolone sodium succinate  (SOLU-MEDROL) 125 mg/2 mL injection 125 mg (has no administration in time range)  HYDROmorphone (DILAUDID) injection 1 mg (1 mg Intravenous Given 01/08/23 0919)  ondansetron (ZOFRAN) injection 4 mg (4 mg Intravenous Given 01/08/23 0920)    ED Course/ Medical Decision Making/ A&P                                 Medical Decision Making Amount and/or Complexity of Data Reviewed Labs: ordered. Radiology: ordered.  Risk Prescription drug management.  Patient improved with pain medicine and steroids. Patient with bulging disc on the right side of her lumbar spine spine.  She is sent home with Percocets and prednisone and will follow-up with her neurosurgeon        Final Clinical Impression(s) / ED Diagnoses Final diagnoses:  Acute right-sided low back pain with sciatica, sciatica laterality unspecified    Rx / DC Orders ED Discharge Orders          Ordered    oxyCODONE-acetaminophen (PERCOCET/ROXICET) 5-325 MG tablet  Every 6 hours PRN        01/08/23 1106    predniSONE (DELTASONE) 10 MG tablet  Daily        01/08/23 1107              Bethann Berkshire, MD 01/08/23 1747

## 2023-01-08 NOTE — ED Triage Notes (Signed)
Pt c/o right lower back pain x couple of days. Pain started radiating down her right leg today. Pt reports the pain is worse when she sits. Pt was given Flexeril with some relief in pain yesterday, none today.

## 2023-01-09 ENCOUNTER — Encounter: Payer: Self-pay | Admitting: Family

## 2023-01-09 ENCOUNTER — Telehealth: Payer: Self-pay | Admitting: Student

## 2023-01-09 ENCOUNTER — Encounter: Payer: Self-pay | Admitting: Orthopedic Surgery

## 2023-01-09 ENCOUNTER — Encounter (HOSPITAL_BASED_OUTPATIENT_CLINIC_OR_DEPARTMENT_OTHER): Payer: Self-pay

## 2023-01-09 NOTE — Telephone Encounter (Signed)
Please advise 

## 2023-01-09 NOTE — Telephone Encounter (Signed)
Called and advised pt.

## 2023-01-09 NOTE — Telephone Encounter (Signed)
Pt called in stating she is still in the same pain and would like a note out of work until she can see Dr. Christell Constant please advise

## 2023-01-09 NOTE — Telephone Encounter (Signed)
Note sent to pt's mychart

## 2023-01-10 ENCOUNTER — Encounter: Payer: Self-pay | Admitting: *Deleted

## 2023-01-10 ENCOUNTER — Telehealth: Payer: Self-pay | Admitting: Student

## 2023-01-10 NOTE — Telephone Encounter (Signed)
[employer] forms received. To [employer]. 

## 2023-01-15 DIAGNOSIS — Z79899 Other long term (current) drug therapy: Secondary | ICD-10-CM | POA: Insufficient documentation

## 2023-01-15 NOTE — Assessment & Plan Note (Signed)
Refill gabapentin 300 mg po at bedtime.  Referral placed for pain clinic, pt request for this referral.

## 2023-01-15 NOTE — Assessment & Plan Note (Signed)
Pt advised of the following: Work on a diabetic diet, try to incorporate exercise at least 20-30 a day for 3 days a week or more.  A1c ordered pending results. 

## 2023-01-15 NOTE — Assessment & Plan Note (Signed)
Tsh ordered pending results.  Refill sent for levothyroxine 175 mcg as directed and 150 mcg as directed.

## 2023-01-15 NOTE — Assessment & Plan Note (Signed)
Non compliant due to insurance status now with insurance  Restart inhalers, rx sent in for budesonide formoterol, spiriva and montelukast. Albuterol prn. RX prednisone for exacerbation. Red flag symptoms discussed.

## 2023-01-15 NOTE — Assessment & Plan Note (Signed)
Restart prozac, start at 20 mg once daily.

## 2023-01-15 NOTE — Assessment & Plan Note (Signed)
Try to decrease and or avoid spicy foods, fried fatty foods, and also caffeine and chocolate as these can increase heartburn symptoms.  Refill sent for panotprazole 40 mg once daily.

## 2023-01-15 NOTE — Assessment & Plan Note (Signed)
Smoking cessation instruction/counseling given:  counseled patient on the dangers of tobacco use, advised patient to stop smoking, and reviewed strategies to maximize success 

## 2023-01-15 NOTE — Assessment & Plan Note (Signed)
Refill mobic 7.5 mg once daily

## 2023-01-17 ENCOUNTER — Encounter: Payer: Self-pay | Admitting: Family

## 2023-01-26 ENCOUNTER — Ambulatory Visit: Payer: BC Managed Care – PPO | Admitting: Orthopedic Surgery

## 2023-01-26 DIAGNOSIS — G8929 Other chronic pain: Secondary | ICD-10-CM | POA: Diagnosis not present

## 2023-01-26 DIAGNOSIS — M545 Low back pain, unspecified: Secondary | ICD-10-CM | POA: Diagnosis not present

## 2023-01-26 NOTE — Progress Notes (Signed)
Orthopedic Spine Surgery Office Note  Assessment: Patient is a 47 y.o. female with acute onset of low back pain.  No radicular symptoms   Plan: -Explained that initially conservative treatment is tried as a significant number of patients may experience relief with these treatment modalities. Discussed that the conservative treatments include:  -activity modification  -physical therapy  -over the counter pain medications  -medrol dosepak  -lumbar steroid injections -Patient has tried Tylenol, ibuprofen, Medrol Dosepak, gabapentin, Percocet  -Recommended continue with the Flexeril and Tylenol.  Discontinue gabapentin. -Recommended a diagnostic/therapeutic lumbar ESI -Would need to be nicotine free prior to any elective spine surgery -Patient should return to office on an as needed basis, if she comes back would get: AP/lateral/flex/ex lumbar   Patient expressed understanding of the plan and all questions were answered to the patient's satisfaction.   ___________________________________________________________________________   History:  Patient is a 47 y.o. female who presents today for lumbar spine.  Patient has a history of a microdiscectomy in 1999.  She did well after surgery.  Within the last month, she has noted acute worsening of back pain.  She feels it in the lower lumbar region around the belt line.  She said she did have some pain rating down the left lower extremity but that has resolved.  She is not having any pain radiating into either lower extremity.  There was no trauma or injury that preceded the onset of pain. Patient rated the pain as a 9/10 at its worst.    Weakness: Yes, low back weakness. No other weakness noted Symptoms of imbalance: Denies Paresthesias and numbness: Denies Bowel or bladder incontinence: Denies Saddle anesthesia: Denies  Treatments tried: Tylenol, ibuprofen, Medrol Dosepak, gabapentin, Percocet  Review of systems: Denies fevers and chills,  night sweats, unexplained weight loss, history of cancer.  Has had pain that wakes her at night  Past medical history: Anxiety COPD  Allergies: NKDA  Past surgical history:  L4/5 microdiscectomy  Social history: Reports use of nicotine product (smoking, vaping, patches, smokeless) Alcohol use: Denies Denies recreational drug use   Physical Exam:  BMI of 33.3  General: no acute distress, appears stated age Neurologic: alert, answering questions appropriately, following commands Respiratory: unlabored breathing on room air, symmetric chest rise Psychiatric: appropriate affect, normal cadence to speech   MSK (spine):  -Strength exam      Left  Right EHL    5/5  5/5 TA    5/5  5/5 GSC    5/5  5/5 Knee extension  5/5  5/5 Hip flexion   5/5  5/5  -Sensory exam    Sensation intact to light touch in L3-S1 nerve distributions of bilateral lower extremities  -Achilles DTR: 2/4 on the left, 2/4 on the right -Patellar tendon DTR: 2/4 on the left, 2/4 on the right  -Straight leg raise: negative bilaterally -Femoral nerve stretch test: negative bilaterally -Clonus: no beats bilaterally  -Left hip exam: No pain through range of motion, negative Stinchfield, negative FABER -Right hip exam: No pain through range of motion, negative Stinchfield, negative FABER  Imaging: MRI of the lumbar spine from 01/08/2023 was independently reviewed and interpreted, showing hemilaminotomy defect on the right at L4/5.  DDD with Modic changes at L4/5.  No significant stenosis seen.   Patient name: [name] Patient MRN: [id number] Date of visit: 01/26/23

## 2023-01-29 ENCOUNTER — Encounter: Payer: Self-pay | Admitting: Family

## 2023-02-02 ENCOUNTER — Telehealth: Payer: Self-pay | Admitting: Orthopedic Surgery

## 2023-02-02 NOTE — Telephone Encounter (Signed)
Pt called triage in reference to referral to Dr. Alvester Morin. She says she hasn't heard anything, but I do see that you have been trying to reach pt for scheduling.

## 2023-02-21 ENCOUNTER — Other Ambulatory Visit: Payer: Self-pay

## 2023-02-21 ENCOUNTER — Ambulatory Visit: Payer: BC Managed Care – PPO | Admitting: Physical Medicine and Rehabilitation

## 2023-02-21 DIAGNOSIS — M47816 Spondylosis without myelopathy or radiculopathy, lumbar region: Secondary | ICD-10-CM | POA: Diagnosis not present

## 2023-02-21 MED ORDER — METHYLPREDNISOLONE ACETATE 40 MG/ML IJ SUSP
40.0000 mg | Freq: Once | INTRAMUSCULAR | Status: AC
Start: 2023-02-21 — End: 2023-02-21
  Administered 2023-02-21: 40 mg

## 2023-02-21 NOTE — Progress Notes (Signed)
 Functional Pain Scale - descriptive words and definitions  Distracting (5)    Aware of pain/able to complete some ADL's but limited by pain/sleep is affected and active distractions are only slightly useful. Moderate range order  Average Pain 5  Worse when she is sitting and walking at work.  R > L it does switch at times.  Pain goes all the way down to her foot at times.  +Driver, -BT, -Dye Allergies.

## 2023-02-26 ENCOUNTER — Other Ambulatory Visit: Payer: Self-pay | Admitting: Family

## 2023-02-26 ENCOUNTER — Encounter: Payer: Self-pay | Admitting: Family

## 2023-02-26 DIAGNOSIS — J441 Chronic obstructive pulmonary disease with (acute) exacerbation: Secondary | ICD-10-CM

## 2023-02-26 DIAGNOSIS — J209 Acute bronchitis, unspecified: Secondary | ICD-10-CM

## 2023-02-26 MED ORDER — IPRATROPIUM-ALBUTEROL 0.5-2.5 (3) MG/3ML IN SOLN: 3.0000 mL | Freq: Four times a day (QID) | RESPIRATORY_TRACT | 0 refills | Status: DC | PRN

## 2023-02-26 NOTE — Telephone Encounter (Signed)
 Please call pt has she seen pulmonary since 2023?  How often is she using her inhaler? Seems to be needing it refilled every month. She needs to get back with pulmonary to do breathing tests, as her copd is not controlled.   Can we ask why she hasn't started her symbicort  yet? Cost?  Will give her an albuterol  refill but will need to get future refills from pulmonary so I know we are hopefully getting some more control.

## 2023-02-26 NOTE — Telephone Encounter (Signed)
 Spoke with pt and she states that she is having to use her albuterol  inhaler 3-4 times a day while she is working. States that she just got her Symbicort  filled and is using daily as prescribed. Advised her that I would let [name] know this information. While speaking to her, she asked to have Duoneb refilled. Advised her that pulmonary are the ones who originally prescribed this and she would need to contact them for this refill. Pt became upset and stated, I don't understand why she can't just do this. I think I need to switch doctors. She knows that I can't get off work right now to go to appointments. I made the pt aware that all of these medications should be monitored by pulmonary as that this their speciality. Pt became condescending and stated, I know what these medications are for and who gives them to me. Advised pt that there was reason to became nasty and speak to me as if I didn't know what I was talking about. Pt became even more agitated and stated that she only spoke to me like that because I spoke to her like that first. Ended the call with the pt making her aware that I would let [name] know about our conversation.

## 2023-02-28 NOTE — Progress Notes (Signed)
 [name] - 48 y.o. female MRN [id number]  Date of birth: [birth year]  Office Visit Note: Visit Date: 02/21/2023 PCP: Corwin Antu, FNP Referred by: Georgina Ozell DELENA, MD  Subjective: Chief Complaint  Patient presents with   Lower Back - Pain   HPI:  [name] is a 48 y.o. female who comes in today at the request of Dr. Ozell Georgina for planned Left L4-5 Lumbar Interlaminar epidural steroid injection with fluoroscopic guidance.  The patient has failed conservative care including home exercise, medications, time and activity modification.  This injection will be diagnostic and hopefully therapeutic.  Please see requesting physician notes for further details and justification.   ROS Otherwise per HPI.  Assessment & Plan: Visit Diagnoses:    ICD-10-CM   1. Spondylosis without myelopathy or radiculopathy, lumbar region  M47.816 methylPREDNISolone  acetate (DEPO-MEDROL ) injection 40 mg    XR C-ARM NO REPORT    Epidural Steroid injection      Plan: No additional findings.   Meds & Orders:  Meds ordered this encounter  Medications   methylPREDNISolone  acetate (DEPO-MEDROL ) injection 40 mg    Orders Placed This Encounter  Procedures   XR C-ARM NO REPORT   Epidural Steroid injection    Follow-up: Return in about 3 weeks (around 03/14/2023) for Ozell Georgina, MD.   Procedures: No procedures performed  Lumbar Epidural Steroid Injection - Interlaminar Approach with Fluoroscopic Guidance  Patient: [name]      Date of Birth: Jun 30, 1975 MRN: [id number] PCP: Corwin Antu, FNP      Visit Date: 02/21/2023   Universal Protocol:     Consent Given By: the patient  Position: PRONE  Additional Comments: Vital signs were monitored before and after the procedure. Patient was prepped and draped in the usual sterile fashion. The correct patient, procedure, and site was verified.   Injection Procedure Details:   Procedure diagnoses: Spondylosis  without myelopathy or radiculopathy, lumbar region [M47.816]   Meds Administered:  Meds ordered this encounter  Medications   methylPREDNISolone  acetate (DEPO-MEDROL ) injection 40 mg     Laterality: Left  Location/Site:  L4-5  Needle: 3.5 in., 20 ga. Tuohy  Needle Placement: Paramedian epidural  Findings:   -Comments: Excellent flow of contrast into the epidural space.  Procedure Details: Using a paramedian approach from the side mentioned above, the region overlying the inferior lamina was localized under fluoroscopic visualization and the soft tissues overlying this structure were infiltrated with 4 ml. of 1% Lidocaine  without Epinephrine. The Tuohy needle was inserted into the epidural space using a paramedian approach.   The epidural space was localized using loss of resistance along with counter oblique bi-planar fluoroscopic views.  After negative aspirate for air, blood, and CSF, a 2 ml. volume of Isovue -250 was injected into the epidural space and the flow of contrast was observed. Radiographs were obtained for documentation purposes.    The injectate was administered into the level noted above.   Additional Comments:  The patient tolerated the procedure well Dressing: 2 x 2 sterile gauze and Band-Aid    Post-procedure details: Patient was observed during the procedure. Post-procedure instructions were reviewed.  Patient left the clinic in stable condition.   Clinical History: MRI LUMBAR SPINE WITHOUT CONTRAST   TECHNIQUE: Multiplanar, multisequence MR imaging of the lumbar spine was performed. No intravenous contrast was administered.   COMPARISON:  MRI lumbar spine 07/04/2005. CT lumbar spine 09/19/2019.   FINDINGS: Segmentation: Conventional numbering is assumed with 5 non-rib-bearing, lumbar  type vertebral bodies. Diminutive ribs at T12.   Alignment:  Trace retrolisthesis of L3 on L4.   Vertebrae: Modic type 2 degenerative endplate marrow signal  changes at L4-5. Normal vertebral body heights. No suspicious marrow lesions or evidence of infection.   Conus medullaris and cauda equina: Conus extends to the L1 level. Conus and cauda equina appear normal.   Paraspinal and other soft tissues: Mild fatty atrophy of the multifidus muscles.   Disc levels:   T12-L1:  Normal.   L1-L2: Small disc bulge without spinal canal stenosis or neural foraminal narrowing.   L2-L3: Small disc bulge and mild bilateral facet arthropathy. No spinal canal stenosis or neural foraminal narrowing.   L3-L4: Right eccentric disc bulge and facet arthropathy results in displacement of the traversing right L4 nerve root in the subarticular zone.   L4-L5: Prior right hemilaminotomy. No spinal canal stenosis. Moderate disc height loss and right-greater-than-left facet arthropathy results in moderate right neural foraminal narrowing.   L5-S1: Small right central disc protrusion. No spinal canal stenosis. Right-greater-than-left facet arthropathy without significant neural foraminal narrowing.   IMPRESSION: 1. No evidence of discitis-osteomyelitis. 2. Prior right hemilaminotomy at L4-5 with moderate right neural foraminal narrowing. 3. Right eccentric disc bulge and facet arthropathy at L3-4 results in displacement of the traversing right L4 nerve root in the subarticular zone.     Electronically Signed   By: Ryan Chess [name]   On: 01/08/2023 10:49     Objective:  VS:  HT:    WT:   BMI:     BP:   HR: bpm  TEMP: ( )  RESP:  Physical Exam Vitals and nursing note reviewed.  Constitutional:      General: She is not in acute distress.    Appearance: Normal appearance. She is not ill-appearing.  HENT:     Head: Normocephalic and atraumatic.     Right Ear: External ear normal.     Left Ear: External ear normal.  Eyes:     Extraocular Movements: Extraocular movements intact.  Cardiovascular:     Rate and Rhythm: Normal rate.      Pulses: Normal pulses.  Pulmonary:     Effort: Pulmonary effort is normal. No respiratory distress.  Abdominal:     General: There is no distension.     Palpations: Abdomen is soft.  Musculoskeletal:        General: Tenderness present.     Cervical back: Neck supple.     Right lower leg: No edema.     Left lower leg: No edema.     Comments: Patient has good distal strength with no pain over the greater trochanters.  No clonus or focal weakness.  Skin:    Findings: No erythema, lesion or rash.  Neurological:     General: No focal deficit present.     Mental Status: She is alert and oriented to person, place, and time.     Sensory: No sensory deficit.     Motor: No weakness or abnormal muscle tone.     Coordination: Coordination normal.  Psychiatric:        Mood and Affect: Mood normal.        Behavior: Behavior normal.      Imaging: No results found.

## 2023-02-28 NOTE — Procedures (Signed)
 Lumbar Epidural Steroid Injection - Interlaminar Approach with Fluoroscopic Guidance  Patient: [name]      Date of Birth: September 08, 1975 MRN: [id number] PCP: Corwin Antu, FNP      Visit Date: 02/21/2023   Universal Protocol:     Consent Given By: the patient  Position: PRONE  Additional Comments: Vital signs were monitored before and after the procedure. Patient was prepped and draped in the usual sterile fashion. The correct patient, procedure, and site was verified.   Injection Procedure Details:   Procedure diagnoses: Spondylosis without myelopathy or radiculopathy, lumbar region [M47.816]   Meds Administered:  Meds ordered this encounter  Medications   methylPREDNISolone  acetate (DEPO-MEDROL ) injection 40 mg     Laterality: Left  Location/Site:  L4-5  Needle: 3.5 in., 20 ga. Tuohy  Needle Placement: Paramedian epidural  Findings:   -Comments: Excellent flow of contrast into the epidural space.  Procedure Details: Using a paramedian approach from the side mentioned above, the region overlying the inferior lamina was localized under fluoroscopic visualization and the soft tissues overlying this structure were infiltrated with 4 ml. of 1% Lidocaine  without Epinephrine. The Tuohy needle was inserted into the epidural space using a paramedian approach.   The epidural space was localized using loss of resistance along with counter oblique bi-planar fluoroscopic views.  After negative aspirate for air, blood, and CSF, a 2 ml. volume of Isovue -250 was injected into the epidural space and the flow of contrast was observed. Radiographs were obtained for documentation purposes.    The injectate was administered into the level noted above.   Additional Comments:  The patient tolerated the procedure well Dressing: 2 x 2 sterile gauze and Band-Aid    Post-procedure details: Patient was observed during the procedure. Post-procedure instructions were  reviewed.  Patient left the clinic in stable condition.

## 2023-03-11 IMAGING — DX DG CHEST 1V PORT
1 series · 1 of 1 positions shown · non-contrast
Comparison: Chest x-ray 12/09/2020. chest x-ray 11/10/2014. Chest
x-ray 01/19/2013.

CLINICAL DATA: Shortness of breath. Hx of asthma, COPD, and current
smoker of 1 pack a day x 25 years. Not being tested for covid-95.

EXAM:
PORTABLE CHEST 1 VIEW

[chest ap]
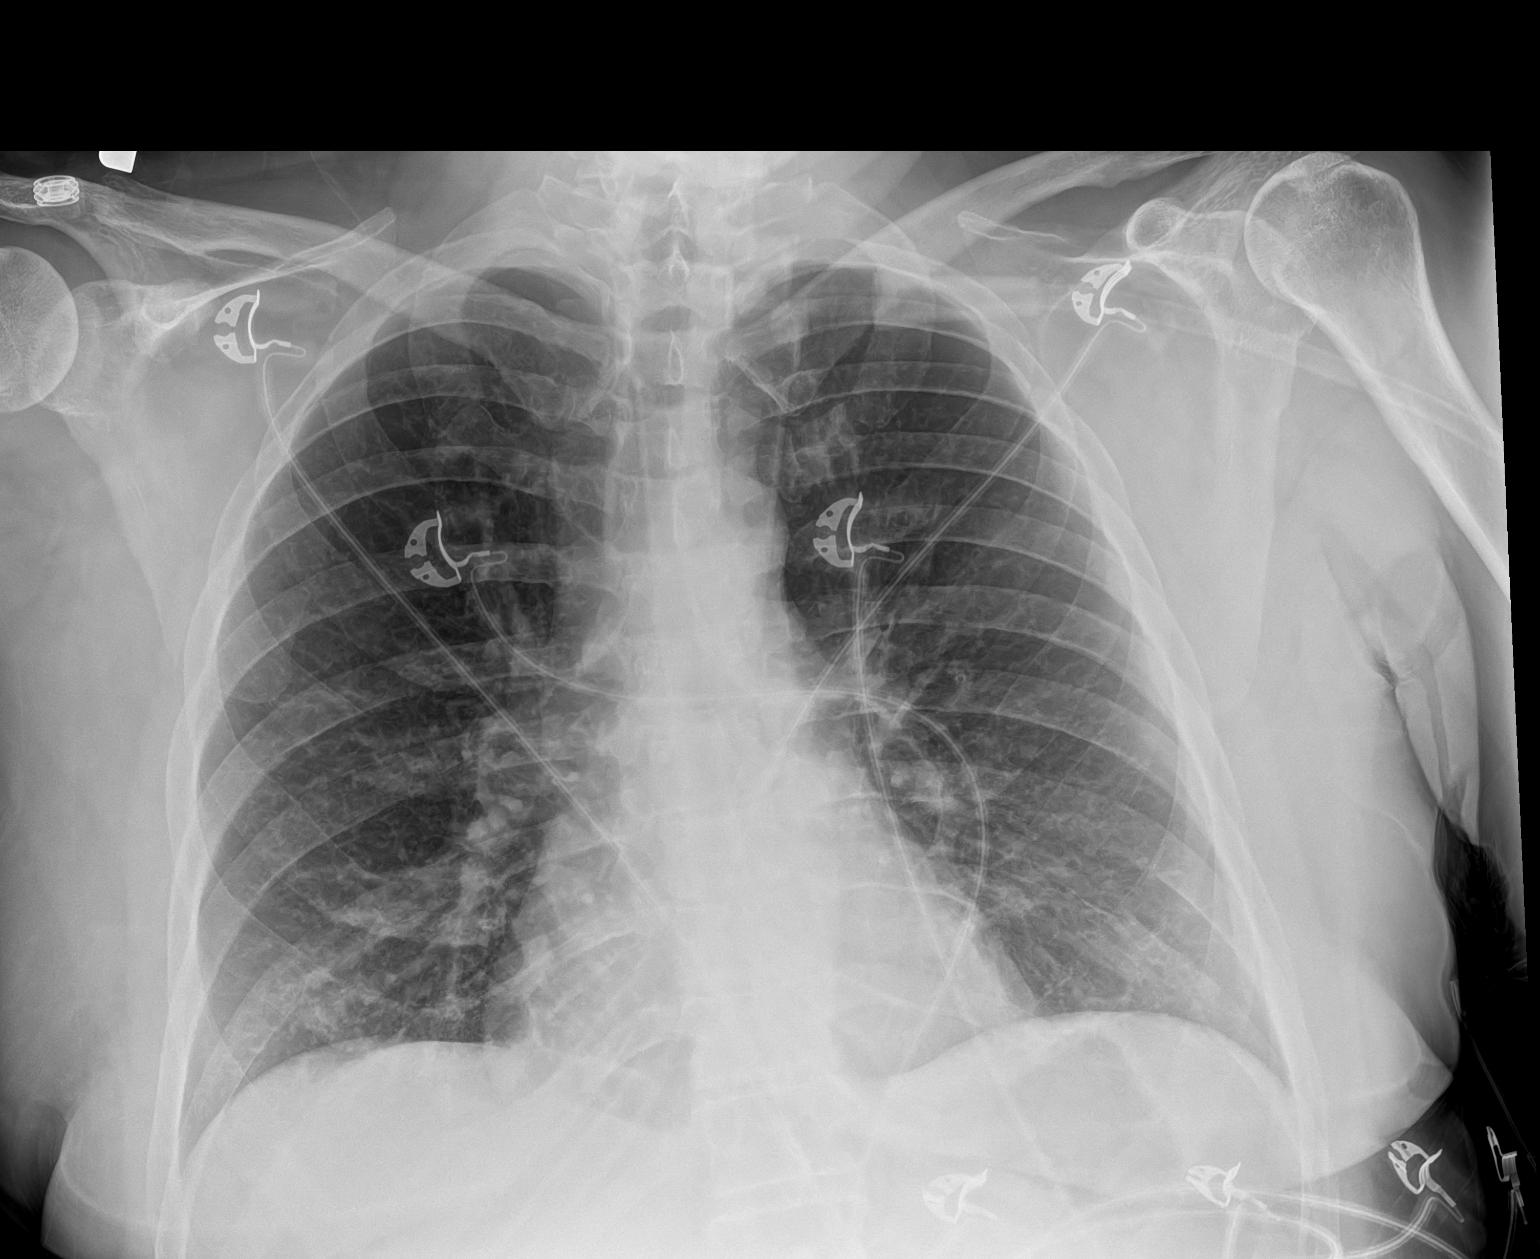

[1 of 1 positions shown; findings below may reference images not displayed]

FINDINGS: The heart and mediastinal contours are within normal limits.

No focal consolidation. Chronic increased interstitial markings with
no overt pulmonary edema. No pleural effusion. No pneumothorax.

No acute osseous abnormality.
IMPRESSION: No active disease.

## 2023-03-15 ENCOUNTER — Ambulatory Visit: Payer: BC Managed Care – PPO | Admitting: Orthopedic Surgery

## 2023-03-28 ENCOUNTER — Ambulatory Visit
Admission: EM | Admit: 2023-03-28 | Discharge: 2023-03-28 | Disposition: A | Discharge status: Home / Self Care | Payer: BC Managed Care – PPO | Attending: Family Medicine | Admitting: Family Medicine

## 2023-03-28 DIAGNOSIS — R52 Pain, unspecified: Secondary | ICD-10-CM | POA: Diagnosis not present

## 2023-03-28 DIAGNOSIS — J441 Chronic obstructive pulmonary disease with (acute) exacerbation: Secondary | ICD-10-CM | POA: Diagnosis not present

## 2023-03-28 DIAGNOSIS — J069 Acute upper respiratory infection, unspecified: Secondary | ICD-10-CM | POA: Diagnosis not present

## 2023-03-28 LAB — POC COVID19/FLU A&B COMBO
Covid Antigen, POC: NEGATIVE
Influenza A Antigen, POC: NEGATIVE
Influenza B Antigen, POC: NEGATIVE

## 2023-03-28 MED ORDER — PROMETHAZINE-DM 6.25-15 MG/5ML PO SYRP: 5.0000 mL | ORAL_SOLUTION | Freq: Four times a day (QID) | ORAL | 0 refills | Status: DC | PRN

## 2023-03-28 MED ORDER — ALBUTEROL SULFATE HFA 108 (90 BASE) MCG/ACT IN AERS: 2.0000 | INHALATION_SPRAY | RESPIRATORY_TRACT | 0 refills | Status: DC | PRN

## 2023-03-28 MED ORDER — DEXAMETHASONE SODIUM PHOSPHATE 10 MG/ML IJ SOLN
10.0000 mg | Freq: Once | INTRAMUSCULAR | Status: AC
  Administered 2023-03-28: 10 mg via INTRAMUSCULAR

## 2023-03-28 MED ORDER — PREDNISONE 20 MG PO TABS: 40.0000 mg | ORAL_TABLET | Freq: Every day | ORAL | 0 refills | Status: DC

## 2023-03-28 MED ORDER — OSELTAMIVIR PHOSPHATE 75 MG PO CAPS: 75.0000 mg | ORAL_CAPSULE | Freq: Two times a day (BID) | ORAL | 0 refills | Status: DC

## 2023-03-28 NOTE — ED Triage Notes (Signed)
Patient presents to the office for cough,wheeze and nasal congestion x 2 days.

## 2023-04-01 NOTE — ED Provider Notes (Signed)
[facility]    CSN: [id number] Arrival date & time: 03/28/23  1759      History   Chief Complaint Chief Complaint  Patient presents with   Cough   Nasal Congestion    HPI ARKIE TAGLIAFERRO is a 48 y.o. female.   Presenting today with 2 day history of cough, wheezing, congestion, fatigue. Denies CP, SOB, abdominal pain, V/D. Hx of asthma and COPD on spiriva, symbicort and albuterol prn with minimal relief. Also taking OTC cough medication with mild relief. Multiple sick contacts recently.     Past Medical History:  Diagnosis Date   Alcohol abuse    Anemia 01/31/2011   Asthma    COPD (chronic obstructive pulmonary disease) (HCC)    Depression    Hepatitis C 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 10/24/2021   RNA negative.  Hep C false positive.     Hypoxia 02/01/2011   Medical history non-contributory    Needs sleep apnea assessment 2013.07.31   Tobacco abuse     Patient Active Problem List   Diagnosis Date Noted   Long-term current use of proton pump inhibitor therapy 01/15/2023   History of alcohol dependence (HCC) 01/05/2023   Chronic bilateral low back pain with bilateral sciatica 01/05/2023   Hypoxia, sleep related 01/05/2023   Spondylosis 01/05/2023   Family history of rheumatoid arthritis 01/05/2023   Elevated LDL cholesterol level 01/05/2023   Pulmonary arterial hypertension (HCC) 01/05/2023   Hypokalemia 02/21/2022   Abnormal EKG 02/07/2022   Hypocalcemia 11/22/2021   COPD exacerbation (HCC) 11/12/2021   Vitamin D deficiency 10/24/2021   Gastroesophageal reflux disease 10/24/2021   Hyperglycemia 10/24/2021   Snoring 10/24/2021   History of colon polyps 10/24/2021   Osteoarthritis 10/24/2021   Bilateral carotid bruits 10/24/2021   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, still smoking 01/16/2020   Hypothyroidism 02/10/2016   Anemia 01/31/2011   Tobacco abuse 01/08/2011   Major depression 01/08/2011    Past Surgical History:  Procedure Laterality  Date   ANKLE GANGLION CYST EXCISION     BACK SURGERY     CESAREAN SECTION     COLONOSCOPY WITH PROPOFOL N/A 06/27/2016   Procedure: COLONOSCOPY WITH PROPOFOL;  Surgeon: Wyline Mood, MD;  Location: [facility] ENDOSCOPY;  Service: Endoscopy;  Laterality: N/A;   DILATION AND CURETTAGE OF UTERUS     ESOPHAGOGASTRODUODENOSCOPY (EGD) WITH PROPOFOL N/A 06/27/2016   Procedure: ESOPHAGOGASTRODUODENOSCOPY (EGD) WITH PROPOFOL;  Surgeon: Wyline Mood, MD;  Location: [facility] ENDOSCOPY;  Service: Endoscopy;  Laterality: N/A;    OB History     Gravida  2   Para  1   Term  1   Preterm      AB  1   Living  1      SAB  1   IAB      Ectopic      Multiple      Live Births  1            Home Medications    Prior to Admission medications   Medication Sig Start Date End Date Taking? Authorizing Provider  albuterol (VENTOLIN HFA) 108 (90 Base) MCG/ACT inhaler INHALE 1 TO 2 PUFFS BY MOUTH EVERY 6 HOURS AS NEEDED FOR WHEEZING OR SHORTNESS OF BREATH . 02/26/23  Yes Dugal, [name], FNP  albuterol (VENTOLIN HFA) 108 (90 Base) MCG/ACT inhaler Inhale 2 puffs into the lungs every 4 (four) hours as needed. 03/28/23  Yes Particia Nearing, PA-C  ALPRAZolam Prudy Feeler) 0.25 MG tablet Take  1 tablet (0.25 mg total) by mouth 2 (two) times daily as needed for anxiety or sleep. 02/21/22  Yes Dugal, Wyatt Mage, FNP  FLUoxetine (PROZAC) 20 MG tablet Take 1 tablet (20 mg total) by mouth daily. 01/05/23  Yes Dugal, Wyatt Mage, FNP  gabapentin (NEURONTIN) 300 MG capsule Take 1 capsule (300 mg total) by mouth daily as needed (pain). 01/05/23 04/05/23 Yes Dugal, [name], FNP  ipratropium-albuterol (DUONEB) 0.5-2.5 (3) MG/3ML SOLN Take 3 mLs by nebulization every 6 (six) hours as needed. 02/26/23 03/28/23 Yes Mort Sawyers, FNP  levothyroxine (SYNTHROID) 175 MCG tablet Take 175 mg tablet daily on Saturday and Sunday every week, take 150 mg tablet once daily Monday through Friday every week and repeat therafter 01/05/23  Yes Dugal,  [name], FNP  montelukast (SINGULAIR) 10 MG tablet Take 1 tablet (10 mg total) by mouth at bedtime. 01/05/23  Yes Mort Sawyers, FNP  oseltamivir (TAMIFLU) 75 MG capsule Take 1 capsule (75 mg total) by mouth every 12 (twelve) hours. 03/28/23  Yes Particia Nearing, PA-C  oxyCODONE-acetaminophen (PERCOCET/ROXICET) 5-325 MG tablet Take 1 tablet by mouth every 6 (six) hours as needed for severe pain (pain score 7-10). 01/08/23  Yes Bethann Berkshire, MD  pantoprazole (PROTONIX) 40 MG tablet Take 1 tablet (40 mg total) by mouth daily. 01/05/23  Yes Dugal, Wyatt Mage, FNP  predniSONE (DELTASONE) 20 MG tablet Take 2 tablets (40 mg total) by mouth daily with breakfast. 03/28/23  Yes Particia Nearing, PA-C  promethazine-dextromethorphan (PROMETHAZINE-DM) 6.25-15 MG/5ML syrup Take 5 mLs by mouth 4 (four) times daily as needed. 03/28/23  Yes Particia Nearing, PA-C  Tiotropium Bromide Monohydrate (SPIRIVA RESPIMAT) 2.5 MCG/ACT AERS Inhale 2 puffs into the lungs daily. 01/05/23  Yes Dugal, Wyatt Mage, FNP  budesonide-formoterol (SYMBICORT) 160-4.5 MCG/ACT inhaler Inhale 2 puffs into the lungs 2 (two) times daily. 01/05/23   Mort Sawyers, FNP  cyclobenzaprine (FLEXERIL) 10 MG tablet Take 1 tablet (10 mg total) by mouth 3 (three) times daily as needed for muscle spasms. 01/05/23   Mort Sawyers, FNP  levothyroxine (SYNTHROID) 150 MCG tablet Take 175 mg tablet daily on Saturday and Sunday every week, take 150 mg tablet once daily Monday through Friday every week and repeat therafter 01/05/23   Mort Sawyers, FNP  meloxicam (MOBIC) 7.5 MG tablet Take 1 tablet (7.5 mg total) by mouth 2 (two) times daily. 01/05/23   Mort Sawyers, FNP  predniSONE (DELTASONE) 10 MG tablet Take 2 tablets (20 mg total) by mouth daily. 01/08/23   Bethann Berkshire, MD  predniSONE (STERAPRED UNI-PAK 21 TAB) 10 MG (21) TBPK tablet Take as directed 01/05/23   Mort Sawyers, FNP    Family History Family History  Problem Relation  Age of Onset   Stroke Mother    Hypothyroidism Mother    Coronary artery disease Mother    Throat cancer Mother    Stroke Maternal Grandmother    Diabetes Maternal Grandmother    COPD Paternal Grandmother     Social History Social History   Tobacco Use   Smoking status: Every Day    Current packs/day: 0.00    Average packs/day: 0.5 packs/day for 25.0 years (12.5 ttl pk-yrs)    Types: Cigarettes    Start date: 11/11/1996    Last attempt to quit: 11/11/2021    Years since quitting: 1.3   Smokeless tobacco: Never   Tobacco comments:    Pt states she stopped smoking when she got out of hospital. ALS 10/20  Vaping Use   Vaping status: Former  Substance Use Topics   Alcohol use: Yes    Comment: occasionally   Drug use: Not Currently    Types: Marijuana, Cocaine    Comment: last >2 y/o     Allergies   Patient has no known allergies.   Review of Systems Review of Systems PER HPI  Physical Exam Triage Vital Signs ED Triage Vitals  Encounter Vitals Group     BP 03/28/23 1920 116/84     Systolic BP Percentile --      Diastolic BP Percentile --      Pulse Rate 03/28/23 1920 95     Resp 03/28/23 1920 16     Temp 03/28/23 1920 98.2 F (36.8 C)     Temp Source 03/28/23 1920 Oral     SpO2 03/28/23 1920 95 %     Weight --      Height --      Head Circumference --      Peak Flow --      Pain Score 03/28/23 1923 0     Pain Loc --      Pain Education --      Exclude from Growth Chart --    No data found.  Updated Vital Signs BP 116/84 (BP Location: Left Arm)   Pulse 95   Temp 98.2 F (36.8 C) (Oral)   Resp 16   LMP 08/22/2022 (Approximate)   SpO2 95%   Visual Acuity Right Eye Distance:   Left Eye Distance:   Bilateral Distance:    Right Eye Near:   Left Eye Near:    Bilateral Near:     Physical Exam Vitals and nursing note reviewed.  Constitutional:      Appearance: Normal appearance.  HENT:     Head: Atraumatic.     Right Ear: Tympanic membrane  and external ear normal.     Left Ear: Tympanic membrane and external ear normal.     Nose: Rhinorrhea present.     Mouth/Throat:     Mouth: Mucous membranes are moist.     Pharynx: Posterior oropharyngeal erythema present.  Eyes:     Extraocular Movements: Extraocular movements intact.     Conjunctiva/sclera: Conjunctivae normal.  Cardiovascular:     Rate and Rhythm: Normal rate and regular rhythm.     Heart sounds: Normal heart sounds.  Pulmonary:     Effort: Pulmonary effort is normal.     Breath sounds: Wheezing present.  Musculoskeletal:        General: Normal range of motion.     Cervical back: Normal range of motion and neck supple.  Skin:    General: Skin is warm and dry.  Neurological:     Mental Status: She is alert and oriented to person, place, and time.  Psychiatric:        Mood and Affect: Mood normal.        Thought Content: Thought content normal.      UC Treatments / Results  Labs (all labs ordered are listed, but only abnormal results are displayed) Labs Reviewed  POC COVID19/FLU A&B COMBO    EKG   Radiology No results found.  Procedures Procedures (including critical care time)  Medications Ordered in UC Medications  dexamethasone (DECADRON) injection 10 mg (10 mg Intramuscular Given 03/28/23 1951)    Initial Impression / Assessment and Plan / UC Course  I have reviewed the triage vital signs and the nursing notes.  Pertinent labs & imaging results that were available during  my care of the patient were reviewed by me and considered in my medical decision making (see chart for details).     Rapid COVID and flu neg, suspect viral URI causing asthma exacerbation. Treat with IM decadron, tamiflu given exposures to flu and viral sxs, prednisone, phenergan dm, inhaler regimen. Return for worsening sxs.  Final Clinical Impressions(s) / UC Diagnoses   Final diagnoses:  Viral URI with cough  Generalized body aches  COPD exacerbation [facility])    Discharge Instructions   None    ED Prescriptions     Medication Sig Dispense Auth. Provider   predniSONE (DELTASONE) 20 MG tablet Take 2 tablets (40 mg total) by mouth daily with breakfast. 10 tablet Particia Nearing, PA-C   promethazine-dextromethorphan (PROMETHAZINE-DM) 6.25-15 MG/5ML syrup Take 5 mLs by mouth 4 (four) times daily as needed. 100 mL Particia Nearing, PA-C   albuterol (VENTOLIN HFA) 108 (90 Base) MCG/ACT inhaler Inhale 2 puffs into the lungs every 4 (four) hours as needed. 18 g Particia Nearing, [place]   oseltamivir (TAMIFLU) 75 MG capsule Take 1 capsule (75 mg total) by mouth every 12 (twelve) hours. 10 capsule Particia Nearing, [place]      PDMP not reviewed this encounter.   Fidelis, [place], [place] 04/01/23 2159

## 2023-04-02 ENCOUNTER — Other Ambulatory Visit: Payer: Self-pay | Admitting: Family

## 2023-04-02 DIAGNOSIS — G8929 Other chronic pain: Secondary | ICD-10-CM

## 2023-04-05 ENCOUNTER — Ambulatory Visit: Payer: BC Managed Care – PPO | Admitting: Orthopedic Surgery

## 2023-04-14 DIAGNOSIS — R42 Dizziness and giddiness: Secondary | ICD-10-CM

## 2023-04-14 HISTORY — DX: Dizziness and giddiness: R42

## 2023-04-27 ENCOUNTER — Telehealth: Payer: Self-pay | Admitting: Family

## 2023-04-27 ENCOUNTER — Ambulatory Visit: Payer: Self-pay | Admitting: Family

## 2023-04-27 NOTE — Telephone Encounter (Signed)
  Chief Complaint: Dizziness Frequency: 2 episodes this week Pertinent Negatives: Patient denies chest pain, SOB, nausea Disposition: [] ED /[] Urgent Care (no appt availability in office) / [x] Appointment(In office/virtual)/ []  [place] [facility]/ [] Home Care/ [] Refused Recommended Disposition /[] [place] [facility]/ []  Follow-up with PCP Additional Notes: Patient stated that she had 2 episodes of dizziness this week. The first time was Monday and the second time was yesterday. Patient has been fine today. Offered to schedule patient appt today and she stated she would not be available until late afternoon. No appts available for that time. Appointment is scheduled for Monday.   Reason for Disposition  [1] MODERATE dizziness (e.g., interferes with normal activities) AND [2] has NOT been evaluated by doctor (or NP/PA) for this  (Exception: Dizziness caused by heat exposure, sudden standing, or poor fluid intake.)  Answer Assessment - Initial Assessment Questions 1. DESCRIPTION: "Describe your dizziness."     Dizziness comes and goes. It feels like the floor is moving.  2. VERTIGO: "Do you feel like either you or the room is spinning or tilting?" (i.e. vertigo)     Feels like floor moving  3.  SEVERITY: "How bad is it?"  "Do you feel like you are going to faint?" "Can you stand and walk?"   - MILD: Feels slightly dizzy, but walking normally.   - MODERATE: Feels unsteady when walking, but not falling; interferes with normal activities (e.g., school, work).   - SEVERE: Unable to walk without falling, or requires assistance to walk without falling; feels like passing out now.      Patient has been fine today. When she has a dizzy spell she feels unsteady when walking.   4. ONSET:  "When did the dizziness begin?"     She had one episode on Monday and then it came back yesterday  5. AGGRAVATING FACTORS: "Does anything make it worse?" (e.g., standing, change in head position)      Walking  6. CAUSE: "What do you think is causing the dizziness?"     Unknown. Patient stated she drinks plenty of water and she has not been taking any new medications lately.   7. OTHER SYMPTOMS: "Do you have any other symptoms?" (e.g., fever, chest pain, vomiting, diarrhea, bleeding)       No  Protocols used: Dizziness - Lightheadedness-A-AH

## 2023-04-27 NOTE — Telephone Encounter (Signed)
 FYI: This call has been transferred to Access Nurse. Once the result note has been entered staff can address the message at that time.  Patient called in with the following symptoms:  Red Word:dizziness  Patient has been experiencing dizzy spells for about a week. She stated that it feels as if the floor is moving and she is going to pass out.   Please advise at [facility] [phone]  Message is routed to Provider Pool and [facility] Triage

## 2023-04-27 NOTE — Telephone Encounter (Signed)
 Noted with er precautions

## 2023-04-30 ENCOUNTER — Encounter: Payer: Self-pay | Admitting: Family

## 2023-04-30 ENCOUNTER — Other Ambulatory Visit: Payer: Self-pay | Admitting: Family

## 2023-04-30 ENCOUNTER — Ambulatory Visit (INDEPENDENT_AMBULATORY_CARE_PROVIDER_SITE_OTHER)
Admission: RE | Admit: 2023-04-30 | Discharge: 2023-04-30 | Disposition: A | Discharge status: Home / Self Care | Source: Ambulatory Visit | Attending: Family

## 2023-04-30 ENCOUNTER — Ambulatory Visit: Admitting: Family

## 2023-04-30 VITALS — BP 122/88 | HR 78 | Temp 98.4°F | Wt 193.0 lb

## 2023-04-30 DIAGNOSIS — I2721 Secondary pulmonary arterial hypertension: Secondary | ICD-10-CM

## 2023-04-30 DIAGNOSIS — H538 Other visual disturbances: Secondary | ICD-10-CM | POA: Insufficient documentation

## 2023-04-30 DIAGNOSIS — J449 Chronic obstructive pulmonary disease, unspecified: Secondary | ICD-10-CM

## 2023-04-30 DIAGNOSIS — Z720 Tobacco use: Secondary | ICD-10-CM | POA: Diagnosis not present

## 2023-04-30 DIAGNOSIS — G4719 Other hypersomnia: Secondary | ICD-10-CM | POA: Insufficient documentation

## 2023-04-30 DIAGNOSIS — I739 Peripheral vascular disease, unspecified: Secondary | ICD-10-CM | POA: Insufficient documentation

## 2023-04-30 DIAGNOSIS — D649 Anemia, unspecified: Secondary | ICD-10-CM

## 2023-04-30 DIAGNOSIS — Z91199 Patient's noncompliance with other medical treatment and regimen due to unspecified reason: Secondary | ICD-10-CM | POA: Insufficient documentation

## 2023-04-30 DIAGNOSIS — E7800 Pure hypercholesterolemia, unspecified: Secondary | ICD-10-CM

## 2023-04-30 DIAGNOSIS — E039 Hypothyroidism, unspecified: Secondary | ICD-10-CM | POA: Diagnosis not present

## 2023-04-30 DIAGNOSIS — R42 Dizziness and giddiness: Secondary | ICD-10-CM | POA: Diagnosis not present

## 2023-04-30 DIAGNOSIS — R062 Wheezing: Secondary | ICD-10-CM | POA: Diagnosis not present

## 2023-04-30 DIAGNOSIS — R0989 Other specified symptoms and signs involving the circulatory and respiratory systems: Secondary | ICD-10-CM

## 2023-04-30 DIAGNOSIS — R0683 Snoring: Secondary | ICD-10-CM

## 2023-04-30 DIAGNOSIS — J441 Chronic obstructive pulmonary disease with (acute) exacerbation: Secondary | ICD-10-CM

## 2023-04-30 DIAGNOSIS — R739 Hyperglycemia, unspecified: Secondary | ICD-10-CM

## 2023-04-30 LAB — BASIC METABOLIC PANEL
BUN: 12 mg/dL (ref 6–23)
CO2: 28 meq/L (ref 19–32)
Calcium: 9.4 mg/dL (ref 8.4–10.5)
Chloride: 103 meq/L (ref 96–112)
Creatinine, Ser: 0.67 mg/dL (ref 0.40–1.20)
GFR: 103.16 mL/min (ref >60.00)
Glucose, Bld: 87 mg/dL (ref 70–99)
Potassium: 3.8 meq/L (ref 3.5–5.1)
Sodium: 139 meq/L (ref 135–145)

## 2023-04-30 LAB — CBC WITH DIFFERENTIAL/PLATELET
Basophils Absolute: 0 10*3/uL (ref 0.0–0.1)
Basophils Relative: 0.3 % (ref 0.0–3.0)
Eosinophils Absolute: 0.4 10*3/uL (ref 0.0–0.7)
Eosinophils Relative: 5.8 % — ABNORMAL HIGH (ref 0.0–5.0)
HCT: 36.1 % (ref 36.0–46.0)
Hemoglobin: 11.5 g/dL — ABNORMAL LOW (ref 12.0–15.0)
Lymphocytes Relative: 41.3 % (ref 12.0–46.0)
Lymphs Abs: 2.7 10*3/uL (ref 0.7–4.0)
MCHC: 32 g/dL (ref 30.0–36.0)
MCV: 81.9 fl (ref 78.0–100.0)
Monocytes Absolute: 0.3 10*3/uL (ref 0.1–1.0)
Monocytes Relative: 4.7 % (ref 3.0–12.0)
Neutro Abs: 3.1 10*3/uL (ref 1.4–7.7)
Neutrophils Relative %: 47.9 % (ref 43.0–77.0)
Platelets: 249 10*3/uL (ref 150.0–400.0)
RBC: 4.4 Mil/uL (ref 3.87–5.11)
RDW: 15.3 % (ref 11.5–15.5)
WBC: 6.4 10*3/uL (ref 4.0–10.5)

## 2023-04-30 LAB — LIPID PANEL
Cholesterol: 173 mg/dL (ref 0–200)
HDL: 69.5 mg/dL (ref >39.00)
LDL Cholesterol: 89 mg/dL (ref 0–99)
NonHDL: 103.72
Total CHOL/HDL Ratio: 2
Triglycerides: 75 mg/dL (ref 0.0–149.0)
VLDL: 15 mg/dL (ref 0.0–40.0)

## 2023-04-30 LAB — VITAMIN B12: Vitamin B-12: 214 pg/mL (ref 211–911)

## 2023-04-30 LAB — TSH: TSH: 13.2 u[IU]/mL — ABNORMAL HIGH (ref 0.35–5.50)

## 2023-04-30 LAB — HEMOGLOBIN A1C: Hgb A1c MFr Bld: 5.4 % (ref 4.6–6.5)

## 2023-04-30 MED ORDER — LEVOTHYROXINE SODIUM 175 MCG PO TABS: 175.0000 ug | ORAL_TABLET | Freq: Every day | ORAL | 0 refills | Status: DC

## 2023-04-30 MED ORDER — SPIRIVA RESPIMAT 2.5 MCG/ACT IN AERS: 2.0000 | INHALATION_SPRAY | Freq: Every day | RESPIRATORY_TRACT | 5 refills | Status: DC

## 2023-04-30 MED ORDER — MECLIZINE HCL 25 MG PO TABS: ORAL_TABLET | ORAL | 0 refills | Status: DC

## 2023-04-30 NOTE — Assessment & Plan Note (Signed)
 Never at neutral baseline,  Pt overdue for appt with pulmonary again stressed importance of this follow up  Continue symbicort, sending again spiriva. Pt to advise me if any issues picking up Did stress importance of night time O2 as has been non compliant.  Goal is to decrease need for albuterol/nebulizer treatments May benefit from otc mucinex  Reviewed CT 11/12/21  Pulmonary arterial hypertension

## 2023-04-30 NOTE — Assessment & Plan Note (Signed)
 See eye doctor for evaluation  Lab work ordered as well.  If any sudden change in vision, needs stat ophthalmology eval and or er

## 2023-04-30 NOTE — Assessment & Plan Note (Signed)
 Ordered lipid panel, pending results. Work on low cholesterol diet and exercise as tolerated

## 2023-04-30 NOTE — Assessment & Plan Note (Signed)
 Continue levothyroxine as prescribed Tsh ordered pending results.

## 2023-04-30 NOTE — Assessment & Plan Note (Signed)
 Pt overdue with pulmonary advised pt to schedule f/u asap  Has been non compliant due to work being difficult about appts Stressed importance

## 2023-04-30 NOTE — Patient Instructions (Addendum)
  A referral was placed today for vascular ABI, this will make sure you do not have circulatory issues. Please let us know if you have not heard back within 2 weeks about the referral.  Sending in spiriva again, let me know if you do not get this covered or there is an issue. I want you on both symbicort and spiriva.

## 2023-04-30 NOTE — Assessment & Plan Note (Signed)
 Ddx lab work ordered however sleep study ordered, r/o sleep apnea

## 2023-04-30 NOTE — Progress Notes (Signed)
 Established Patient Office Visit  Subjective:   Patient ID: [name], female    DOB: 12-15-1975  Age: 48 y.o. MRN: [id number]  CC:  Chief Complaint  Patient presents with   Dizziness    HPI: [name] is a 48 y.o. female presenting on 04/30/2023 for Dizziness  Having dizzy spells. When walking feels like she is walking forward but that she is 'going backwards' happened last week at work quite a few days, last time she felt this was yesterday while at home. Most of the time she notices it when she is walking. Not leaning sideways but she does stagger and has to catch herself from falling. When it occurred at work last week they checked her oxygen and it was 89% per their o2 sats. She does note sob but states this is stable with her chronic sob. She does notice as well recent change in vision, a little more blurry this is throughout the day when she tries to look at her phone especially.  States she eats regularly does not skip meals. She feels the room is spinning almost as if she is 'in a washer machine on spin cycle'  EKG reviewed 7/29 with tachycardia other no acute concerns  She does have chest pain and congestion, but this is chronic for her. She is overdue to see pulmonary (has not been seen since 12/23), but work complicates this for her. She is using nebulizer a few times over the weekend and or in the am if struggling. She is using her inhaler a few times a day and at night time. She is on symbicort 2 puffs twice daily not on spiriva. She is not sure why she is not taking spiriva, might be due to cost?  She has also noted extremely painful lower legs, feels it from bil knees down. The pain is worse with walking. She does feel numbness and tingling/burning. No redness or swelling.   She does report snoring. She does wake up 3-4 times a night because of her breathing, gasping for breath at times. He is supposed to be using oxygen at night time but she isn't  compliant with this. She still has a cough, takes otc cough medication without improvement.   She does have excessive daytime sleepiness as well.      ROS: Negative unless specifically indicated above in HPI.   Relevant past medical history reviewed and updated as indicated.   Allergies and medications reviewed and updated.   Current Outpatient Medications:    meclizine (ANTIVERT) 25 MG tablet, Take 1/2 to one tablet once daily prn vertigo, Disp: 30 tablet, Rfl: 0   albuterol (VENTOLIN HFA) 108 (90 Base) MCG/ACT inhaler, INHALE 1 TO 2 PUFFS BY MOUTH EVERY 6 HOURS AS NEEDED FOR WHEEZING OR SHORTNESS OF BREATH ., Disp: 9 g, Rfl: 0   albuterol (VENTOLIN HFA) 108 (90 Base) MCG/ACT inhaler, Inhale 2 puffs into the lungs every 4 (four) hours as needed., Disp: 18 g, Rfl: 0   ALPRAZolam (XANAX) 0.25 MG tablet, Take 1 tablet (0.25 mg total) by mouth 2 (two) times daily as needed for anxiety or sleep., Disp: 30 tablet, Rfl: 0   budesonide-formoterol (SYMBICORT) 160-4.5 MCG/ACT inhaler, Inhale 2 puffs into the lungs 2 (two) times daily., Disp: 1 each, Rfl: 3   cyclobenzaprine (FLEXERIL) 10 MG tablet, Take 1 tablet (10 mg total) by mouth 3 (three) times daily as needed for muscle spasms., Disp: 30 tablet, Rfl: 0   FLUoxetine (PROZAC) 20  MG tablet, Take 1 tablet (20 mg total) by mouth daily., Disp: 90 tablet, Rfl: 3   gabapentin (NEURONTIN) 300 MG capsule, TAKE 1 CAPSULE BY MOUTH ONCE DAILY AS NEEDED FOR PAIN, Disp: 90 capsule, Rfl: 0   ipratropium-albuterol (DUONEB) 0.5-2.5 (3) MG/3ML SOLN, Take 3 mLs by nebulization every 6 (six) hours as needed., Disp: 45 mL, Rfl: 0   levothyroxine (SYNTHROID) 175 MCG tablet, Take 1 tablet (175 mcg total) by mouth daily., Disp: 90 tablet, Rfl: 0   meloxicam (MOBIC) 7.5 MG tablet, Take 1 tablet (7.5 mg total) by mouth 2 (two) times daily., Disp: 180 tablet, Rfl: 0   montelukast (SINGULAIR) 10 MG tablet, Take 1 tablet (10 mg total) by mouth at bedtime., Disp: 30 tablet,  Rfl: 0   oxyCODONE-acetaminophen (PERCOCET/ROXICET) 5-325 MG tablet, Take 1 tablet by mouth every 6 (six) hours as needed for severe pain (pain score 7-10)., Disp: 20 tablet, Rfl: 0   Tiotropium Bromide Monohydrate (SPIRIVA RESPIMAT) 2.5 MCG/ACT AERS, Inhale 2 puffs into the lungs daily., Disp: 1 each, Rfl: 5  Current Facility-Administered Medications:    ipratropium-albuterol (DUONEB) 0.5-2.5 (3) MG/3ML nebulizer solution 3 mL, 3 mL, Nebulization, Q6H, Glenford Bayley, NP, 3 mL at 11/17/21 1357  No Known Allergies  Objective:   BP 122/88 (BP Location: Left Arm, Patient Position: Sitting, Cuff Size: Normal)   Pulse 78   Temp 98.4 F (36.9 C) (Temporal)   Wt 193 lb (87.5 kg)   LMP 08/22/2022 (Approximate)   SpO2 94%   BMI 33.13 kg/m    Physical Exam Vitals reviewed.  Constitutional:      General: She is not in acute distress.    Appearance: Normal appearance. She is obese. She is not ill-appearing, toxic-appearing or diaphoretic.  HENT:     Head: Normocephalic.  Cardiovascular:     Rate and Rhythm: Normal rate and regular rhythm.  Pulmonary:     Effort: Pulmonary effort is normal.     Breath sounds: Decreased air movement and transmitted upper airway sounds present. Examination of the right-upper field reveals wheezing and rhonchi. Examination of the left-upper field reveals wheezing and rhonchi. Examination of the right-middle field reveals wheezing and rhonchi. Examination of the left-middle field reveals wheezing and rhonchi. Examination of the right-lower field reveals wheezing and rhonchi. Examination of the left-lower field reveals wheezing and rhonchi. Wheezing and rhonchi present.  Musculoskeletal:        General: Normal range of motion.     Right lower leg: 1+ Edema present.     Left lower leg: 1+ Edema present.  Neurological:     General: No focal deficit present.     Mental Status: She is alert and oriented to person, place, and time. Mental status is at baseline.      Cranial Nerves: Cranial nerves 2-12 are intact. No cranial nerve deficit or facial asymmetry.     Sensory: Sensation is intact.     Motor: Motor function is intact.     Coordination: Romberg sign positive. Heel to [facility] Test abnormal. Finger-Nose-Finger Test normal. Rapid alternating movements normal.     Gait: Gait abnormal (unstable).     Comments: Negative dicks hallpyke   Psychiatric:        Mood and Affect: Mood normal.        Behavior: Behavior normal.        Thought Content: Thought content normal.        Judgment: Judgment normal.    Title   Diabetic Foot  Exam - detailed Is there a history of foot ulcer?: No Is there a foot ulcer now?: No Is there swelling?: No Is there elevated skin temperature?: No Is there abnormal foot shape?: No Is there a claw toe deformity?: No Are the toenails long?: No Are the toenails thick?: No Are the toenails ingrown?: No Is the skin thin, fragile, shiny and hairless?": No Normal Range of Motion?: Yes Is there foot or ankle muscle weakness?: No Do you have pain in calf while walking?: Yes Are the shoes appropriate in style and fit?: Yes Can the patient see the bottom of their feet?: Yes Pulse Foot Exam completed.: Yes   Right Posterior Tibialis: Present Left posterior Tibialis: Present   Right Dorsalis Pedis: Present Left Dorsalis Pedis: Present     Semmes-Weinstein Monofilament Test "+" means "has sensation" and "-" means "no sensation"  R Foot Test Control: Pos L Foot Test Control: Pos   R Site 1-Great Toe: Pos L Site 1-Great Toe: Pos   R Site 4: Pos L Site 4: Pos   R site 5: Pos L Site 5: Pos  R Site 6: Pos L Site 6: Pos     Image components are not supported.   Image components are not supported. Image components are not supported.  [place] Comments      Assessment & Plan:  Intermittent claudication (HCC) -     VAS [place] ABI WITH/WO TBI; Future  Tobacco abuse Assessment & Plan: Smoking cessation  instruction/counseling given:  counseled patient on the dangers of tobacco use, advised patient to stop smoking, and reviewed strategies to maximize success   Orders: -     VAS [place] ABI WITH/WO TBI; Future  Dizziness Assessment & Plan: EKG in office NSR  No acute ST changes  Lab work up to include cbc ibc ferritin b12 bmp and A1c and tsh  Ddx hypoglycemia, low b12, IDA, thyroid process, electrolyte abn  Ddx vertigo, not positional however Trial meclizine  If neg workup all around with no improvement will need to refer to neurology   Orders: -     Hemoglobin A1c -     Basic metabolic panel -     Vitamin B12 -     CBC with Differential/Platelet -     IBC + Ferritin; Future -     EKG 12-Lead -     Meclizine HCl; Take 1/2 to one tablet once daily prn vertigo  Dispense: 30 tablet; Refill: 0  Anemia, unspecified type -     CBC with Differential/Platelet -     IBC + Ferritin; Future  Acquired hypothyroidism Assessment & Plan: Continue levothyroxine as prescribed Tsh ordered pending results.   Orders: -     TSH  COPD exacerbation (HCC) Assessment & Plan: Never at neutral baseline,  Pt overdue for appt with pulmonary again stressed importance of this follow up  Continue symbicort, sending again spiriva. Pt to advise me if any issues picking up Did stress importance of night time O2 as has been non compliant.  Goal is to decrease need for albuterol/nebulizer treatments May benefit from otc mucinex  Reviewed CT 11/12/21  Pulmonary arterial hypertension   Orders: -     Spiriva Respimat; Inhale 2 puffs into the lungs daily.  Dispense: 1 each; Refill: 5  Bilateral carotid bruits  Elevated LDL cholesterol level Assessment & Plan: Ordered lipid panel, pending results. 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  Dispense: 1 each; Refill: 5  Wheezing -     DG Chest 2 View; Future  Blurry vision Assessment & Plan: See eye doctor for evaluation  Lab work ordered as well.  If any sudden change in vision, needs stat ophthalmology eval and or er      Follow up plan: Return in about 3 months (around 07/31/2023) for follow up thyroid.  Mort Sawyers, FNP

## 2023-04-30 NOTE — Assessment & Plan Note (Signed)
 EKG in office NSR  No acute ST changes  Lab work up to include cbc ibc ferritin b12 bmp and A1c and tsh  Ddx hypoglycemia, low b12, IDA, thyroid process, electrolyte abn  Ddx vertigo, not positional however Trial meclizine  If neg workup all around with no improvement will need to refer to neurology

## 2023-04-30 NOTE — Assessment & Plan Note (Signed)
 Smoking cessation instruction/counseling given:  counseled patient on the dangers of tobacco use, advised patient to stop smoking, and reviewed strategies to maximize success

## 2023-05-02 ENCOUNTER — Ambulatory Visit: Payer: Self-pay | Admitting: Family

## 2023-05-02 DIAGNOSIS — J449 Chronic obstructive pulmonary disease, unspecified: Secondary | ICD-10-CM

## 2023-05-02 NOTE — Telephone Encounter (Signed)
 Spoke with pt and she is aware of Matt's recommendation. Nothing further was needed.

## 2023-05-02 NOTE — Telephone Encounter (Signed)
 Copied from CRM [id number]. Topic: Clinical - Red Word Triage >> May 02, 2023 11:08 AM Drema Balzarine wrote: Red Word that prompted transfer to Nurse Triage: Patient 3/17 for dizziness, provider changed thyroid medication and prescribed a med for vertigo but it's not working. She's still experiencing the dizziness and had to leave work [employer] risk [employer] Complaint: Moderate/severe dizziness Symptoms: Lightheadedness, room spinning, difficulty walking Frequency: A week-10 days Pertinent Negatives: Patient denies falling Disposition: [x] ED /[] Urgent Care (no appt availability in office) / [] Appointment(In office/virtual)/ []  [place] [facility]/ [] Home Care/ [x] Refused Recommended Disposition /[] [place] [facility]/ []  Follow-up with PCP Additional Notes: Patient called in to report moderate/severe dizziness. Patient stated she feels lightheaded and like the room and floors are moving and spinning. Patient stated she has been feeling this way since a week from Monday. Patient was seen in the office on 04/30/23. Patient stated she moves from side to side when walking and constantly has to brace on to something in order to keep her balance. Patient denied fainting/falling, but stated she felt like she was going to pass out at work today. Patient was sent home from work today. Patient expressed concern about losing her job because she has been out sick with these symptoms. Patient denied vomiting, changes in vision and sudden onset of weakness. This RN advised ED, per protocol. Patient stated she did not have a ride at this time. This RN advised patient that she could call 911 for a ride. Patient declined. Patient is requesting a call back from the office. This RN called call to notify of ED refusal.   Reason for Disposition  SEVERE dizziness (e.g., unable to stand, requires support to walk, feels like passing out now)  Answer Assessment - Initial Assessment Questions 1. DESCRIPTION: "Describe your dizziness."      States she feels lightheaded and the floor is moving forwards and backwards 2. LIGHTHEADED: "Do you feel lightheaded?" (e.g., somewhat faint, woozy, weak upon standing)     States she feels lightheaded, denies passing out and falling 3. VERTIGO: "Do you feel like either you or the room is spinning or tilting?" (i.e. vertigo)     Denies history of vertigo, but states room is spinning around her nonstop 4. SEVERITY: "How bad is it?"  "Do you feel like you are going to faint?" "Can you stand and walk?"   - MILD: Feels slightly dizzy, but walking normally.   - MODERATE: Feels unsteady when walking, but not falling; interferes with normal activities (e.g., school, work).   - SEVERE: Unable to walk without falling, or requires assistance to walk without falling; feels like passing out now.      Moderate/severe- states the is staggering from left to right when she walks, states she always has to reach out/hold onto something for balance 5. ONSET:  "When did the dizziness begin?"     A week prior to Monday 6. AGGRAVATING FACTORS: "Does anything make it worse?" (e.g., standing, change in head position)     Walking 7. HEART RATE: "Can you tell me your heart rate?" "How many beats in 15 seconds?"  (Note: not all patients can do this)       N/A 8. CAUSE: "What do you think is causing the dizziness?"     Unknown 9. RECURRENT SYMPTOM: "Have you had dizziness before?" If Yes, ask: "When was the last time?" "What happened that time?"     Denies 10. OTHER SYMPTOMS: "Do you have any other symptoms?" (e.g., fever,  chest pain, vomiting, diarrhea, bleeding)       Denies vomiting, denies changes in vision, denies headache  Protocols used: Dizziness - Lightheadedness-A-AH

## 2023-05-02 NOTE — Telephone Encounter (Signed)
 Recommend that patient been seen immediately either at ED or UC

## 2023-05-02 NOTE — Telephone Encounter (Signed)
 LM for pt to returncall

## 2023-05-03 ENCOUNTER — Ambulatory Visit: Payer: BC Managed Care – PPO | Admitting: Orthopedic Surgery

## 2023-05-07 ENCOUNTER — Emergency Department (HOSPITAL_COMMUNITY)

## 2023-05-07 ENCOUNTER — Other Ambulatory Visit: Payer: Self-pay

## 2023-05-07 ENCOUNTER — Emergency Department (HOSPITAL_COMMUNITY)
Admission: EM | Admit: 2023-05-07 | Discharge: 2023-05-07 | Disposition: A | Discharge status: Home / Self Care | Attending: Emergency Medicine | Admitting: Emergency Medicine

## 2023-05-07 ENCOUNTER — Encounter (HOSPITAL_COMMUNITY): Payer: Self-pay

## 2023-05-07 DIAGNOSIS — R42 Dizziness and giddiness: Secondary | ICD-10-CM | POA: Insufficient documentation

## 2023-05-07 DIAGNOSIS — E039 Hypothyroidism, unspecified: Secondary | ICD-10-CM | POA: Diagnosis not present

## 2023-05-07 DIAGNOSIS — J209 Acute bronchitis, unspecified: Secondary | ICD-10-CM

## 2023-05-07 LAB — URINALYSIS, ROUTINE W REFLEX MICROSCOPIC
Bacteria, UA: NONE SEEN
Bilirubin Urine: NEGATIVE
Glucose, UA: NEGATIVE mg/dL
Ketones, ur: NEGATIVE mg/dL
Leukocytes,Ua: NEGATIVE
Nitrite: NEGATIVE
Protein, ur: NEGATIVE mg/dL
Specific Gravity, Urine: 1.01 (ref 1.005–1.030)
pH: 5 (ref 5.0–8.0)

## 2023-05-07 LAB — CBC WITH DIFFERENTIAL/PLATELET
Abs Immature Granulocytes: 0.01 10*3/uL (ref 0.00–0.07)
Basophils Absolute: 0 10*3/uL (ref 0.0–0.1)
Basophils Relative: 1 %
Eosinophils Absolute: 0.3 10*3/uL (ref 0.0–0.5)
Eosinophils Relative: 5 %
HCT: 37.8 % (ref 36.0–46.0)
Hemoglobin: 11.7 g/dL — ABNORMAL LOW (ref 12.0–15.0)
Immature Granulocytes: 0 %
Lymphocytes Relative: 41 %
Lymphs Abs: 2.5 10*3/uL (ref 0.7–4.0)
MCH: 26.1 pg (ref 26.0–34.0)
MCHC: 31 g/dL (ref 30.0–36.0)
MCV: 84.4 fL (ref 80.0–100.0)
Monocytes Absolute: 0.4 10*3/uL (ref 0.1–1.0)
Monocytes Relative: 6 %
Neutro Abs: 2.8 10*3/uL (ref 1.7–7.7)
Neutrophils Relative %: 47 %
Platelets: 319 10*3/uL (ref 150–400)
RBC: 4.48 MIL/uL (ref 3.87–5.11)
RDW: 14.4 % (ref 11.5–15.5)
WBC: 6 10*3/uL (ref 4.0–10.5)
nRBC: 0 % (ref 0.0–0.2)

## 2023-05-07 LAB — COMPREHENSIVE METABOLIC PANEL
ALT: 17 U/L (ref 0–44)
AST: 21 U/L (ref 15–41)
Albumin: 4.3 g/dL (ref 3.5–5.0)
Alkaline Phosphatase: 61 U/L (ref 38–126)
Anion gap: 9 (ref 5–15)
BUN: 15 mg/dL (ref 6–20)
CO2: 25 mmol/L (ref 22–32)
Calcium: 9.6 mg/dL (ref 8.9–10.3)
Chloride: 101 mmol/L (ref 98–111)
Creatinine, Ser: 0.77 mg/dL (ref 0.44–1.00)
GFR, Estimated: >60 mL/min (ref >60)
Glucose, Bld: 85 mg/dL (ref 70–99)
Potassium: 3.9 mmol/L (ref 3.5–5.1)
Sodium: 135 mmol/L (ref 135–145)
Total Bilirubin: 0.4 mg/dL (ref 0.0–1.2)
Total Protein: 7.6 g/dL (ref 6.5–8.1)

## 2023-05-07 LAB — T4, FREE: Free T4: 1.17 ng/dL — ABNORMAL HIGH (ref 0.61–1.12)

## 2023-05-07 LAB — CBG MONITORING, ED: Glucose-Capillary: 81 mg/dL (ref 70–99)

## 2023-05-07 MED ORDER — SODIUM CHLORIDE 0.9 % IV BOLUS
1000.0000 mL | Freq: Once | INTRAVENOUS | Status: AC
  Administered 2023-05-07: 1000 mL via INTRAVENOUS

## 2023-05-07 MED ORDER — ALBUTEROL SULFATE HFA 108 (90 BASE) MCG/ACT IN AERS: 2.0000 | INHALATION_SPRAY | RESPIRATORY_TRACT | 2 refills | Status: DC | PRN

## 2023-05-07 MED ORDER — ALBUTEROL SULFATE HFA 108 (90 BASE) MCG/ACT IN AERS
1.0000 | INHALATION_SPRAY | Freq: Once | RESPIRATORY_TRACT | Status: AC
  Administered 2023-05-07: 1 via RESPIRATORY_TRACT
  Filled 2023-05-07: qty 6.7

## 2023-05-07 MED ORDER — MECLIZINE HCL 25 MG PO TABS: 25.0000 mg | ORAL_TABLET | Freq: Three times a day (TID) | ORAL | 0 refills | Status: DC | PRN

## 2023-05-07 MED ORDER — SODIUM CHLORIDE 0.9 % IV SOLN: INTRAVENOUS | Status: DC

## 2023-05-07 MED ORDER — MECLIZINE HCL 12.5 MG PO TABS
25.0000 mg | ORAL_TABLET | Freq: Once | ORAL | Status: AC
  Administered 2023-05-07: 25 mg via ORAL
  Filled 2023-05-07: qty 2

## 2023-05-07 NOTE — ED Provider Notes (Signed)
 Care of patient assumed from Dr. Jeraldine Loots.  Patient presenting for dizziness, worsened with positional changes.  Awaiting MRI.  If negative, discharge is anticipated. Physical Exam  BP 116/65   Pulse 68   Temp 97.9 F (36.6 C) (Oral)   Resp 19   Ht 5\' 4"  (1.626 m)   Wt 87.5 kg   LMP 08/22/2022 (Approximate)   SpO2 97%   BMI 33.11 kg/m   Physical Exam Vitals and nursing note reviewed.  Constitutional:      General: She is not in acute distress.    Appearance: Normal appearance. She is well-developed. She is not ill-appearing, toxic-appearing or diaphoretic.  HENT:     Head: Normocephalic and atraumatic.     Right Ear: External ear normal.     Left Ear: External ear normal.     Nose: Nose normal.     Mouth/Throat:     Mouth: Mucous membranes are moist.  Eyes:     Extraocular Movements: Extraocular movements intact.     Conjunctiva/sclera: Conjunctivae normal.  Cardiovascular:     Rate and Rhythm: Normal rate and regular rhythm.  Pulmonary:     Effort: Pulmonary effort is normal. No respiratory distress.  Abdominal:     General: There is no distension.     Palpations: Abdomen is soft.  Musculoskeletal:        General: No swelling. Normal range of motion.     Cervical back: Normal range of motion and neck supple.  Skin:    General: Skin is warm and dry.  Neurological:     General: No focal deficit present.     Mental Status: She is alert and oriented to person, place, and time.     Cranial Nerves: No cranial nerve deficit.     Sensory: No sensory deficit.     Motor: No weakness.     Coordination: Coordination normal.  Psychiatric:        Mood and Affect: Mood normal.        Behavior: Behavior normal.     Procedures  Procedures  ED Course / MDM    Medical Decision Making Amount and/or Complexity of Data Reviewed Labs: ordered. Radiology: ordered.  Risk Prescription drug management.   On exam, patient resting comfortably.  Her vertigo is currently  resolved.  MRI did not show acute findings.  Patient was prescribed meclizine.  She was discharged in stable condition.       Gloris Manchester, MD 05/07/23 [phone]

## 2023-05-07 NOTE — ED Triage Notes (Signed)
 Pt arrived via POV c/o on-going dizziness. Pt reports recently having blood work done at her PCP office and her TSH level was elevated. Pt advised to seek further treatment at the ER for dizziness.

## 2023-05-07 NOTE — ED Provider Notes (Signed)
 [place] EMERGENCY DEPARTMENT AT [facility] Provider Note   CSN: [id number] Arrival date & time: 05/07/23  1038     History {Add pertinent medical, surgical, social history, OB history to HPI:1} Chief Complaint  Patient presents with   Dizziness    [name] is a 47 y.o. female.  HPI Patient presents with a female companion who assist with history.  Patient is a smoker, has ongoing evaluation with her physician as an outpatient due to hypothyroidism.  However, patient has been generally well until about 2 weeks ago when she developed new dizziness, gait unsteadiness.  Some symptoms while sitting, but essentially with new difficulty walking secondary to dizziness, unsteadiness she presents for evaluation.    Home Medications Prior to Admission medications   Medication Sig Start Date End Date Taking? Authorizing Provider  albuterol (VENTOLIN HFA) 108 (90 Base) MCG/ACT inhaler INHALE 1 TO 2 PUFFS BY MOUTH EVERY 6 HOURS AS NEEDED FOR WHEEZING OR SHORTNESS OF BREATH . 02/26/23   Mort Sawyers, FNP  albuterol (VENTOLIN HFA) 108 (90 Base) MCG/ACT inhaler Inhale 2 puffs into the lungs every 4 (four) hours as needed. 03/28/23   Particia Nearing, PA-C  ALPRAZolam Prudy Feeler) 0.25 MG tablet Take 1 tablet (0.25 mg total) by mouth 2 (two) times daily as needed for anxiety or sleep. 02/21/22   Mort Sawyers, FNP  budesonide-formoterol (SYMBICORT) 160-4.5 MCG/ACT inhaler Inhale 2 puffs into the lungs 2 (two) times daily. 01/05/23   Mort Sawyers, FNP  cyclobenzaprine (FLEXERIL) 10 MG tablet Take 1 tablet (10 mg total) by mouth 3 (three) times daily as needed for muscle spasms. 01/05/23   Mort Sawyers, FNP  FLUoxetine (PROZAC) 20 MG tablet Take 1 tablet (20 mg total) by mouth daily. 01/05/23   Mort Sawyers, FNP  gabapentin (NEURONTIN) 300 MG capsule TAKE 1 CAPSULE BY MOUTH ONCE DAILY AS NEEDED FOR PAIN 04/02/23   Mort Sawyers, FNP  ipratropium-albuterol (DUONEB) 0.5-2.5  (3) MG/3ML SOLN Take 3 mLs by nebulization every 6 (six) hours as needed. 02/26/23 03/28/23  Mort Sawyers, FNP  levothyroxine (SYNTHROID) 175 MCG tablet Take 1 tablet (175 mcg total) by mouth daily. 04/30/23   Mort Sawyers, FNP  meclizine (ANTIVERT) 25 MG tablet Take 1/2 to one tablet once daily prn vertigo 04/30/23   Mort Sawyers, FNP  meloxicam (MOBIC) 7.5 MG tablet Take 1 tablet (7.5 mg total) by mouth 2 (two) times daily. 01/05/23   Mort Sawyers, FNP  montelukast (SINGULAIR) 10 MG tablet Take 1 tablet (10 mg total) by mouth at bedtime. 01/05/23   Mort Sawyers, FNP  oxyCODONE-acetaminophen (PERCOCET/ROXICET) 5-325 MG tablet Take 1 tablet by mouth every 6 (six) hours as needed for severe pain (pain score 7-10). 01/08/23   Bethann Berkshire, MD  Tiotropium Bromide Monohydrate (SPIRIVA RESPIMAT) 2.5 MCG/ACT AERS Inhale 2 puffs into the lungs daily. 04/30/23   Mort Sawyers, FNP      Allergies    Patient has no known allergies.    Review of Systems   Review of Systems  Physical Exam Updated Vital Signs BP 116/83   Pulse 78   Temp 97.9 F (36.6 C) (Oral)   Resp 16   Ht 5\' 4"  (1.626 m)   Wt 87.5 kg   LMP 08/22/2022 (Approximate)   SpO2 96%   BMI 33.11 kg/m  Physical Exam Vitals and nursing note reviewed.  Constitutional:      General: She is not in acute distress.    Appearance: She is well-developed.  HENT:  Head: Normocephalic and atraumatic.  Eyes:     Conjunctiva/sclera: Conjunctivae normal.  Cardiovascular:     Rate and Rhythm: Normal rate and regular rhythm.  Pulmonary:     Effort: Pulmonary effort is normal. No respiratory distress.     Breath sounds: Normal breath sounds. No stridor.  Abdominal:     General: There is no distension.  Skin:    General: Skin is warm and dry.  Neurological:     Mental Status: She is alert and oriented to person, place, and time.     Cranial Nerves: No cranial nerve deficit.     Motor: No weakness.     Coordination:  Coordination normal.     Comments: Gait not tested secondary to unsteadiness  Psychiatric:        Mood and Affect: Mood normal.     ED Results / Procedures / Treatments   Labs (all labs ordered are listed, but only abnormal results are displayed) Labs Reviewed  COMPREHENSIVE METABOLIC PANEL  CBC WITH DIFFERENTIAL/PLATELET  URINALYSIS, ROUTINE W REFLEX MICROSCOPIC  CBG MONITORING, ED    EKG None  Radiology No results found.  Procedures Procedures  {Document cardiac monitor, telemetry assessment procedure when appropriate:1}  Medications Ordered in ED Medications  sodium chloride 0.9 % bolus 1,000 mL (has no administration in time range)    And  0.9 %  sodium chloride infusion (has no administration in time range)  meclizine (ANTIVERT) tablet 25 mg (has no administration in time range)    ED Course/ Medical Decision Making/ A&P   {   Click here for ABCD2, HEART and other calculatorsREFRESH Note before signing :1}                              Medical Decision Making Adult female presents with new gait difficulty, dizziness.  Patient has known endocrine disorder, but given her description of symptoms, vertigo less likely.  With his smoking history, severity of unsteadiness, some consideration of mass versus metastatic disease versus stroke.  Patient had CT, labs, x-ray, monitoring. Cardiac 80 sinus normal pulse ox 100% room air normal  Amount and/or Complexity of Data Reviewed Independent Historian: friend External Data Reviewed: notes.    Details: Outpatient notes including thyroid medication titration noted Labs: ordered. Decision-making details documented in ED Course. Radiology: ordered and independent interpretation performed. Decision-making details documented in ED Course. ECG/medicine tests: ordered and independent interpretation performed. Decision-making details documented in ED Course.  Risk Prescription drug management. Decision regarding  hospitalization. Diagnosis or treatment significantly limited by social determinants of health.   ***  {Document critical care time when appropriate:1} {Document review of labs and clinical decision tools ie heart score, Chads2Vasc2 etc:1}  {Document your independent review of radiology images, and any outside records:1} {Document your discussion with family members, caretakers, and with consultants:1} {Document social determinants of health affecting pt's care:1} {Document your decision making why or why not admission, treatments were needed:1} Final Clinical Impression(s) / ED Diagnoses Final diagnoses:  None    Rx / DC Orders ED Discharge Orders     None

## 2023-05-07 NOTE — Discharge Instructions (Addendum)
 Test results today are reassuring.  A prescription for meclizine was sent to your pharmacy.  Take as needed for dizziness.  Stay hydrated.  Follow-up with your primary care doctor regarding your thyroid levels and ongoing daily medications.  Return to the emergency department for any new or worsening symptoms of concern.

## 2023-05-07 NOTE — ED Notes (Signed)
 Warm blanket given. Friend at bedside. Call light within reach.

## 2023-05-08 ENCOUNTER — Telehealth: Payer: Self-pay | Admitting: Family

## 2023-05-08 LAB — T3, FREE: T3, Free: 2.9 pg/mL (ref 2.0–4.4)

## 2023-05-08 NOTE — Telephone Encounter (Signed)
 Spoke with pt, I have scheduled her for an OV with [name] so that all questions can be answered and forms can be filled out correctly.

## 2023-05-08 NOTE — Telephone Encounter (Signed)
 Short term disability forms were received today and placed in [name]'s in box.

## 2023-05-08 NOTE — Telephone Encounter (Signed)
 Can we ask for more details? Continuous leave? From what date to what date?  What's the next steps from ER visit, do we need to get her in for office visit to determine referrals etc? Or does she want to use this time to see the pulmonologist as this is priority. Since was vertigo I can refer to ENT as well if she'd like? Unless symptoms have improved?

## 2023-05-09 ENCOUNTER — Ambulatory Visit: Admitting: Family

## 2023-05-10 ENCOUNTER — Encounter: Payer: Self-pay | Admitting: *Deleted

## 2023-05-10 ENCOUNTER — Ambulatory Visit (HOSPITAL_COMMUNITY)
Admission: RE | Admit: 2023-05-10 | Discharge: 2023-05-10 | Disposition: A | Discharge status: Home / Self Care | Source: Ambulatory Visit | Attending: Family | Admitting: Family

## 2023-05-10 ENCOUNTER — Encounter: Payer: Self-pay | Admitting: Family

## 2023-05-10 ENCOUNTER — Ambulatory Visit (INDEPENDENT_AMBULATORY_CARE_PROVIDER_SITE_OTHER): Admitting: Family

## 2023-05-10 VITALS — BP 118/88 | HR 83 | Temp 97.6°F | Ht 64.0 in | Wt 195.6 lb

## 2023-05-10 DIAGNOSIS — H81393 Other peripheral vertigo, bilateral: Secondary | ICD-10-CM | POA: Diagnosis not present

## 2023-05-10 DIAGNOSIS — R0609 Other forms of dyspnea: Secondary | ICD-10-CM

## 2023-05-10 DIAGNOSIS — J0110 Acute frontal sinusitis, unspecified: Secondary | ICD-10-CM

## 2023-05-10 DIAGNOSIS — I739 Peripheral vascular disease, unspecified: Secondary | ICD-10-CM

## 2023-05-10 DIAGNOSIS — J349 Unspecified disorder of nose and nasal sinuses: Secondary | ICD-10-CM

## 2023-05-10 DIAGNOSIS — I2721 Secondary pulmonary arterial hypertension: Secondary | ICD-10-CM | POA: Insufficient documentation

## 2023-05-10 DIAGNOSIS — J449 Chronic obstructive pulmonary disease, unspecified: Secondary | ICD-10-CM | POA: Diagnosis not present

## 2023-05-10 DIAGNOSIS — Z720 Tobacco use: Secondary | ICD-10-CM | POA: Insufficient documentation

## 2023-05-10 DIAGNOSIS — R42 Dizziness and giddiness: Secondary | ICD-10-CM

## 2023-05-10 DIAGNOSIS — J441 Chronic obstructive pulmonary disease with (acute) exacerbation: Secondary | ICD-10-CM | POA: Insufficient documentation

## 2023-05-10 DIAGNOSIS — E039 Hypothyroidism, unspecified: Secondary | ICD-10-CM

## 2023-05-10 MED ORDER — BUDESONIDE-FORMOTEROL FUMARATE 160-4.5 MCG/ACT IN AERO: 2.0000 | INHALATION_SPRAY | Freq: Two times a day (BID) | RESPIRATORY_TRACT | Status: DC

## 2023-05-10 MED ORDER — PREDNISONE 20 MG PO TABS: ORAL_TABLET | ORAL | 0 refills | Status: DC

## 2023-05-10 MED ORDER — IOHEXOL 300 MG/ML  SOLN
80.0000 mL | Freq: Once | INTRAMUSCULAR | Status: AC | PRN
  Administered 2023-05-10: 80 mL via INTRAVENOUS

## 2023-05-10 MED ORDER — VARENICLINE TARTRATE 1 MG PO TABS: 1.0000 mg | ORAL_TABLET | Freq: Two times a day (BID) | ORAL | 1 refills | Status: AC

## 2023-05-10 MED ORDER — METHYLPREDNISOLONE ACETATE 80 MG/ML IJ SUSP
80.0000 mg | Freq: Once | INTRAMUSCULAR | Status: AC
  Administered 2023-05-10: 80 mg via INTRAMUSCULAR

## 2023-05-10 MED ORDER — AMOXICILLIN-POT CLAVULANATE 875-125 MG PO TABS: 1.0000 | ORAL_TABLET | Freq: Two times a day (BID) | ORAL | 0 refills | Status: DC

## 2023-05-10 MED ORDER — VARENICLINE TARTRATE (STARTER) 0.5 MG X 11 & 1 MG X 42 PO TBPK: ORAL_TABLET | ORAL | 0 refills | Status: DC

## 2023-05-10 NOTE — Assessment & Plan Note (Signed)
 Ongoing, not controlled Rx prednisone 40 mg once daily x 5 days Depo medrol 80 mg IM in office today Advised do not take ibuprofen today or while on prednisone Start prednisone together  Advised of ER precautions

## 2023-05-10 NOTE — Assessment & Plan Note (Signed)
 Noted on CT chest Pt seeing pulmonary tomorrow Could consider pulmonary hypertension clinic

## 2023-05-10 NOTE — Patient Instructions (Addendum)
  Chantix:  Approaches to selecting a tobacco quit date: May either choose a fixed quit date (ie, start varenicline, then quit on day 8) or a flexible quit date (ie, start varenicline, then quit between days 8 to 35). Alternatively, a gradual quit date (ie, start varenicline and reduce smoking 50% by week 4, reduce an additional 50% by week 8, and continue reducing with a goal of complete abstinence by week 12) is acceptable   ------------------------------------  Recommend daily flonase and also zyrtec at night for allergies.   ------------------------------------  Vascular, [facility],  Please call the following number to see if you can get your ultrasound of your legs set up it is called an ABI.  [phone]    ------------------------------------  Ordering stat CT chest  Ordering echocardiogram, they should call to get this scheduled in the next week or so   A referral was placed today for vascular surgery and ENT Please let us know if you have not heard back within 2 weeks about the referral.  Solu medrol in office today  Start prednisone tomorrow  No ibuprofen in mean time

## 2023-05-10 NOTE — Assessment & Plan Note (Signed)
 Observed on CT  Referral placed for ENT as also with vertigo, possible epley needed Treating sinusitis with augmentin  Advised to start zyrtec and also flonase nose spray

## 2023-05-10 NOTE — Assessment & Plan Note (Signed)
 Pt has restarted levothyroxine 3/17  Will repeat tsh x 6 weeks

## 2023-05-10 NOTE — Assessment & Plan Note (Signed)
 Prescription given for augmentin 875/125 mg po bid for ten days. Pt to continue tylenol/ibuprofen prn sinus pain. Continue with humidifier prn and steam showers recommended as well. instructed If no symptom improvement in 48 hours please f/u Suspect this is contributing to her dizziness/vertigo

## 2023-05-10 NOTE — Assessment & Plan Note (Signed)
 Pt agreeable to quit Start chantix starter pack Smoking cessation instruction/counseling given:  counseled patient on the dangers of tobacco use, advised patient to stop smoking, and reviewed strategies to maximize success

## 2023-05-10 NOTE — Assessment & Plan Note (Signed)
 Smoking cessation, pt ok to try  CT chest w contrast ordered pending results.  Continue spirvia and symbicort  Singulair was not helpful  Ordering echocardiogram as well to r/o cardiac concerns Oxygen at night time as indicated  Goal is to decrease need for nebulizer and albuterol treatments  Pt seeing pulmonary tomorrow to evaluate, has not been seen since 2023

## 2023-05-10 NOTE — Assessment & Plan Note (Signed)
 Vas ABI ordered However will refer to vascular surgeon as well for eval/treat as worsening

## 2023-05-10 NOTE — Progress Notes (Signed)
 Established Patient Office Visit  Subjective:      CC:  Chief Complaint  Patient presents with   Medical Management of Chronic Issues    Short term disability paperwork    HPI: [name] is a 48 y.o. female presenting on 05/10/2023 for Medical Management of Chronic Issues (Short term disability paperwork)  COPD, sob, did restart spiriva 2.5 mg two puffs daily  Is still taking symbicort 150/4.5 2 puff twice daily  She does have h/o slight increase eosinophils suffers from allergies  Has not noticed any real improvement in her breathing or sob.  She with poor doe and worsening at times, still waking up at night time with sob needing albuterol. She is using her nebulizer a few times a day. Her last prednisone pack was about 2 weeks ago with some slight improvement. She does admit she stopped singulair didn't think it was helping.   Does use oxygen at night time but now finding she needs it intermittently in the daytime as she has noticed with walking her o2 sat seems to drop to 88 %   Smoking, does have desire to quit. Tried nicotine gum but made her feel like her stomach was on fire.   Vertigo, last seen in ER 05/07/23  CT head without contrast: no acute abn, partially empty sella turcica, per impression can reflect incidental anatomic variation and or chronic idiopathic intracranial hypertension, paranasal sinus disease, trace left mastoid effusion. She does report recent onset sinus pressure and sinus tenderness, frequent runny nose and pnd.   She states over the last three months pain in the calves that will make it so that she has to stop walking, when she wakes up the pain is pretty bad as well. She does note she has seen excess swelling as well.   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  Job title: window Proofreader Symptoms: with bending over with  worsening dizziness, unsteady gait, pain in legs aggravated by walking often throughout the day and standing more than sitting. Doe worsening, works long hours ten hours a day, and has noticed hard to keep up and or complete job due to these symptoms.  She works four tens in a row, and now on Friday as well with 8 hours.      Social history:  Relevant past medical, surgical, family and social history reviewed and updated as indicated. Interim medical history since our last visit reviewed.  Allergies and medications reviewed and updated.  DATA REVIEWED: CHART IN EPIC     ROS: Negative unless specifically indicated above in HPI.    Current Outpatient Medications:    albuterol (VENTOLIN HFA) 108 (90 Base) MCG/ACT inhaler, INHALE 1 TO 2 PUFFS BY MOUTH EVERY 6 HOURS AS NEEDED FOR WHEEZING OR SHORTNESS OF BREATH ., Disp: 9 g, Rfl: 0   albuterol (VENTOLIN HFA) 108 (90 Base) MCG/ACT inhaler, Inhale 2 puffs into the lungs every 4 (four) hours as needed., Disp: 18 g, Rfl: 2   ALPRAZolam (XANAX) 0.25 MG tablet, Take 1 tablet (0.25 mg total) by mouth 2 (two) times daily as needed for anxiety or sleep., Disp: 30 tablet, Rfl: 0   amoxicillin-clavulanate (AUGMENTIN) 875-125 MG tablet, Take 1 tablet by mouth 2 (two) times daily., Disp: 20 tablet, Rfl: 0   cyclobenzaprine (FLEXERIL) 10 MG tablet, Take 1 tablet (10 mg total) by mouth 3 (three)  times daily as needed for muscle spasms., Disp: 30 tablet, Rfl: 0   FLUoxetine (PROZAC) 20 MG tablet, Take 1 tablet (20 mg total) by mouth daily., Disp: 90 tablet, Rfl: 3   gabapentin (NEURONTIN) 300 MG capsule, TAKE 1 CAPSULE BY MOUTH ONCE DAILY AS NEEDED FOR PAIN, Disp: 90 capsule, Rfl: 0   ipratropium-albuterol (DUONEB) 0.5-2.5 (3) MG/3ML SOLN, Take 3 mLs by nebulization every 6 (six) hours as needed., Disp: 45 mL, Rfl: 0   levothyroxine (SYNTHROID) 175 MCG tablet, Take 1 tablet (175 mcg total) by mouth daily., Disp: 90 tablet, Rfl: 0   meclizine (ANTIVERT) 25  MG tablet, Take 1 tablet (25 mg total) by mouth 3 (three) times daily as needed for dizziness., Disp: 30 tablet, Rfl: 0   meloxicam (MOBIC) 7.5 MG tablet, Take 1 tablet (7.5 mg total) by mouth 2 (two) times daily., Disp: 180 tablet, Rfl: 0   oxyCODONE-acetaminophen (PERCOCET/ROXICET) 5-325 MG tablet, Take 1 tablet by mouth every 6 (six) hours as needed for severe pain (pain score 7-10)., Disp: 20 tablet, Rfl: 0   predniSONE (DELTASONE) 20 MG tablet, Take two tablets once daily for five days, Disp: 10 tablet, Rfl: 0   Tiotropium Bromide Monohydrate (SPIRIVA RESPIMAT) 2.5 MCG/ACT AERS, Inhale 2 puffs into the lungs daily., Disp: 1 each, Rfl: 5   varenicline (CHANTIX CONTINUING MONTH PAK) 1 MG tablet, Take 1 tablet (1 mg total) by mouth 2 (two) times daily., Disp: 60 tablet, Rfl: 1   Varenicline Tartrate, Starter, (CHANTIX STARTING MONTH PAK) 0.5 MG X 11 & 1 MG X 42 TBPK, Take one 0.5 mg tablet by mouth once daily for 3 days, then increase to one 0.5 mg tablet twice daily for 4 days, then increase to one 1 mg tablet twice daily., Disp: 1 each, Rfl: 0   budesonide-formoterol (SYMBICORT) 160-4.5 MCG/ACT inhaler, Inhale 2 puffs into the lungs 2 (two) times daily., Disp: , Rfl:   Current Facility-Administered Medications:    ipratropium-albuterol (DUONEB) 0.5-2.5 (3) MG/3ML nebulizer solution 3 mL, 3 mL, Nebulization, Q6H, Glenford Bayley, NP, 3 mL at 11/17/21 1357      Objective:    BP 118/88 (BP Location: Left Arm, Patient Position: Sitting, Cuff Size: Large)   Pulse 83   Temp 97.6 F (36.4 C) (Temporal)   Ht 5\' 4"  (1.626 m)   Wt 195 lb 9.6 oz (88.7 kg)   LMP 08/22/2022 (Approximate)   SpO2 97%   BMI 33.57 kg/m   Wt Readings from Last 3 Encounters:  05/10/23 195 lb 9.6 oz (88.7 kg)  05/07/23 192 lb 14.4 oz (87.5 kg)  04/30/23 193 lb (87.5 kg)    Physical Exam Vitals reviewed.  Constitutional:      General: She is not in acute distress.    Appearance: Normal appearance. She is normal  weight. She is not ill-appearing, toxic-appearing or diaphoretic.  HENT:     Head: Normocephalic.  Cardiovascular:     Rate and Rhythm: Normal rate and regular rhythm.  Pulmonary:     Effort: Pulmonary effort is normal. No respiratory distress.     Breath sounds: Decreased air movement present. Examination of the right-upper field reveals decreased breath sounds and wheezing. Examination of the left-upper field reveals decreased breath sounds and wheezing. Examination of the right-middle field reveals decreased breath sounds and wheezing. Examination of the left-middle field reveals decreased breath sounds and wheezing. Examination of the right-lower field reveals decreased breath sounds and wheezing. Examination of the left-lower field reveals decreased breath  sounds and wheezing. Decreased breath sounds and wheezing present.  Musculoskeletal:        General: Normal range of motion.  Neurological:     General: No focal deficit present.     Mental Status: She is alert and oriented to person, place, and time. Mental status is at baseline.  Psychiatric:        Mood and Affect: Mood normal.        Behavior: Behavior normal.        Thought Content: Thought content normal.        Judgment: Judgment normal.     Walking sats  Sitting sats Both at 95%      Assessment & Plan:  Tobacco abuse Assessment & Plan: Pt agreeable to quit Start chantix starter pack Smoking cessation instruction/counseling given:  counseled patient on the dangers of tobacco use, advised patient to stop smoking, and reviewed strategies to maximize success   Orders: -     Varenicline Tartrate (Starter); Take one 0.5 mg tablet by mouth once daily for 3 days, then increase to one 0.5 mg tablet twice daily for 4 days, then increase to one 1 mg tablet twice daily.  Dispense: 1 each; Refill: 0 -     Varenicline Tartrate; Take 1 tablet (1 mg total) by mouth 2 (two) times daily.  Dispense: 60 tablet; Refill: 1 -      Ambulatory referral to Vascular Surgery -     CT CHEST W CONTRAST; Future  COPD exacerbation (HCC) Assessment & Plan: Ongoing, not controlled Rx prednisone 40 mg once daily x 5 days Depo medrol 80 mg IM in office today Advised do not take ibuprofen today or while on prednisone Start prednisone together  Advised of ER precautions  Orders: -     predniSONE; Take two tablets once daily for five days  Dispense: 10 tablet; Refill: 0 -     Budesonide-Formoterol Fumarate; Inhale 2 puffs into the lungs 2 (two) times daily. 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  Dizziness  Peripheral vertigo of both ears -     Ambulatory referral to ENT  Paranasal sinus disease Assessment & Plan: Observed on CT  Referral placed for ENT as also with vertigo, possible epley needed Treating sinusitis with augmentin  Advised to start zyrtec and also flonase nose spray   Orders: -     Ambulatory referral to ENT  DOE (dyspnea on exertion) Assessment & Plan: Smoking cessation, pt ok to try  CT chest w contrast ordered pending results.  Continue spirvia and symbicort  Singulair was not helpful  Ordering echocardiogram as well to r/o cardiac concerns Oxygen at night time as indicated  Goal is to decrease need for nebulizer and albuterol treatments  Pt seeing pulmonary tomorrow to evaluate, has not been seen since 2023  Orders: -     ECHOCARDIOGRAM COMPLETE; Future -     CT CHEST W CONTRAST; Future  Pulmonary arterial hypertension (HCC) Assessment & Plan: Noted on CT chest Pt seeing pulmonary tomorrow Could consider pulmonary hypertension clinic  Orders: -     CT CHEST W CONTRAST; Future  Intermittent claudication [facility]) Assessment & Plan: Vas ABI ordered However will refer to vascular surgeon as well for eval/treat as  worsening  Orders: -  Ambulatory referral to Vascular Surgery  Acute non-recurrent frontal sinusitis Assessment & Plan: Prescription given for augmentin 875/125 mg po bid for ten days. Pt to continue tylenol/ibuprofen prn sinus pain. Continue with humidifier prn and steam showers recommended as well. instructed If no symptom improvement in 48 hours please f/u Suspect this is contributing to her dizziness/vertigo  Orders: -     Amoxicillin-Pot Clavulanate; Take 1 tablet by mouth 2 (two) times daily.  Dispense: 20 tablet; Refill: 0  Acquired hypothyroidism Assessment & Plan: Pt has restarted levothyroxine 3/17  Will repeat tsh x 6 weeks    Starting STD for time out from work.  Start date: 05/08/23  Rtw anticipated: 07/10/23 Continuous period of time out of work   Return in about 1 month (around 06/10/2023) for follow up multiple concerns.  Mort Sawyers, MSN, APRN, FNP-C [facility]

## 2023-05-11 ENCOUNTER — Ambulatory Visit: Admitting: Primary Care

## 2023-05-11 ENCOUNTER — Encounter: Payer: Self-pay | Admitting: Primary Care

## 2023-05-11 ENCOUNTER — Encounter: Payer: Self-pay | Admitting: *Deleted

## 2023-05-11 VITALS — BP 128/102 | HR 72 | Ht 65.0 in | Wt 196.4 lb

## 2023-05-11 DIAGNOSIS — R062 Wheezing: Secondary | ICD-10-CM | POA: Diagnosis not present

## 2023-05-11 DIAGNOSIS — R0602 Shortness of breath: Secondary | ICD-10-CM | POA: Diagnosis not present

## 2023-05-11 DIAGNOSIS — J449 Chronic obstructive pulmonary disease, unspecified: Secondary | ICD-10-CM | POA: Diagnosis not present

## 2023-05-11 DIAGNOSIS — R0683 Snoring: Secondary | ICD-10-CM

## 2023-05-11 DIAGNOSIS — Z720 Tobacco use: Secondary | ICD-10-CM

## 2023-05-11 DIAGNOSIS — Z9189 Other specified personal risk factors, not elsewhere classified: Secondary | ICD-10-CM

## 2023-05-11 DIAGNOSIS — J40 Bronchitis, not specified as acute or chronic: Secondary | ICD-10-CM

## 2023-05-11 LAB — POCT EXHALED NITRIC OXIDE: FeNO level (ppb): 17

## 2023-05-11 LAB — BRAIN NATRIURETIC PEPTIDE: Pro B Natriuretic peptide (BNP): 21 pg/mL (ref 0.0–100.0)

## 2023-05-11 MED ORDER — GUAIFENESIN ER 600 MG PO TB12: 1200.0000 mg | ORAL_TABLET | Freq: Two times a day (BID) | ORAL | 1 refills | Status: DC | PRN

## 2023-05-11 MED ORDER — AZELASTINE HCL 0.1 % NA SOLN: 1.0000 | Freq: Two times a day (BID) | NASAL | 1 refills | Status: AC

## 2023-05-11 MED ORDER — BUDESONIDE 0.5 MG/2ML IN SUSP: 0.5000 mg | Freq: Two times a day (BID) | RESPIRATORY_TRACT | 0 refills | Status: DC | PRN

## 2023-05-11 MED ORDER — PREDNISONE 10 MG PO TABS: ORAL_TABLET | ORAL | 0 refills | Status: DC

## 2023-05-11 NOTE — Progress Notes (Signed)
 @Patient  ID: [name], female    DOB: 01-08-1976, 48 y.o.   MRN: [id number]  Chief Complaint  Patient presents with   Follow-up    Wheezing, going on for over month.    Referring provider: Mort Sawyers, FNP  HPI: 48 year old female, current everyday smoker (25 pack year hx). PMH significant for COPD GOLD II, CAP, acute respiratory failure, hypothyroidism, alcohol dependence, major depression, anemia, tobacco abuse. Patient of Dr. Sherene Sires.  Previous [facility] pulmonary encounter: 02/19/2020  48 year old female former smoker followed in our office for COPD.  She is established with Dr. Sherene Sires.  She was last seen in December/2021.  She is a previous [facility] patient.  At last visit in December/2021 she was status post hospitalization in September/2021.  Patient is presenting today after completing pulmonary function testing.  Since last being seen patient was seen in urgent care on 01/22/2020 for chronic pain in both shoulders.  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  She reports that she utilizes OSHA mask guidelines.  There is some dust, as well as smoking and heat in the job that she does.  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  She does feel that she works in a fairly open area.  Patient does not have recent CT imaging of her chest.  Prior to working as a Location manager she is to work in [employer].  Patient is currently in the process of trying to obtain a license.  She had a DUI when she was younger about 2 decades ago.  She is presenting today with the [facility] ignition interlock medical accommodation form to be completed.  03/02/2020 Patient presents today for 1 week follow-up with Spirometry. Maintained on Breztri. Uses oxygen at night. Received flu vaccine last week. She has shortness of breath with activities. She is compliant with Breztri two puffs twice daily. She requires her albuterol rescue in 2-3 times a day.  No acute symptoms today. She tested positive for COVID 2 weeks ago at an [place] medical/urgent care, states that she was treated with Vit C, zithromax and prednisone. She did not require hospitalization and has no residual symptoms. She is looking to obtain her drivers license. She got a DUI over 20 years ago. Reports that she has been sober for 4 years. Needing two spirometry tests to show that she is unable to use interlock system. Denies chest tightness, wheezing or cough.   11/16/2021  Patient presents today for hospital follow-up. History of COPD.  Patient was admitted to the hospital from 11/12/2021 - 11/15/2021 for COPD exacerbation secondary to pneumonia.  She was treated with azithromycin and Rocephin along with IV Solu-Medrol and bronchodilators treatments.  Patient symptoms returned to baseline.  She was discharged on prednisone 40mg  x5 days and Augmentin.   She is feeling a little better. She still has productive cough, shortness of breath and wheezing. She is currently on Augmentin and prednisone from the hospital. She is on 4L oxygen. She is using her mother portable tank and would like to get an order for portable concentrator. She needs an new nebulizer machine. She is maintained on Symbicort . She started smoking again 6 weeks ago. She is out of work currently d/t herniated disc.   12/02/2021 Patient presents today for 2 week follow-up. She is feeling well today, reports that he cough and shortness of breath are better. She has residual fatigue. She was on sample of  Breztri but this inhaler is not covered by her insurance. She has quit smoking and remains motivated to abstain from cigarettes.    05/11/2023- Interim hx  Discussed the use of AI scribe software for clinical note transcription with the patient, who gave verbal consent to proceed.  [name] is a 48 year old female with stage 3 COPD who presents for overdue follow-up with dizziness and wheezing.  Patient has  been experiencing dizzy spells and wheezing. She saw her primary care provider yesteday and underwent CT chest which was over all reassuring showing mild emphysema, mild cardiomegaly, and improvement in pulmonary hypertension, scarring, and resolved pneumonia.  Pulmonary function testing in April 2022 showed moderate-severe obstructive airway disease, FEV1 54% predicated. She has stage 3 COPD and has recently resumed using her prescribed inhalers, including Spiriva for the past week and Symbicort for the past three weeks. She uses oxygen at night and occasionally during the day when her oxygen levels drop. Her symptoms are worse in the late evening and early morning. She experiences shortness of breath with activity and wakes up gasping for breath at night, occurring at least three times a night. She is currently using a nebulizer with Duo Nebs and her albuterol inhaler frequently, approximately six times a day, which provides immediate relief but symptoms return within a couple of hours. She has a history of smoking and is attempting to quit using Chantix.   She reports sinus congestion and a persistent cough with yellow sputum. She has been prescribed Augmentin twice daily x 10 days and prednisone 40mg  x 5 days for bronchitis. Prednisone noted to provide temporary relief. She has also been using Mucinex for her cough and is awaiting a nasal spray prescription. Promethazine-dm for cough was ineffective.    She has been referred to ENT due to vertigo symptoms   Chest imaging: 05/10/23 CT chest>> Interval borderline cardiomegaly. Interval decrease in size of the enlarged main pulmonary artery, suggesting improved pulmonary arterial hypertension. Interval improvement in bilateral atelectasis/scarring. Resolved pneumonia in the right lower lobe. One of the previously demonstrated 5 mm ground-glass nodular densities in the right upper lobe is no longer present. The other is smaller and more solid-appearing,  measuring 3 mm. This does not need imaging follow-up. Mild emphysematous change in the right middle lobe.   No Known Allergies  Immunization History  Administered Date(s) Administered   Influenza Inj Mdck Quad With Preservative 09/26/2021   Influenza,inj,Quad PF,6+ Mos 11/12/2014, 02/13/2016, 12/07/2016, 02/19/2020   Janssen (J&J) SARS-COV-2 Vaccination 07/21/2019    Past Medical History:  Diagnosis Date   Alcohol abuse    Anemia 01/31/2011   Asthma    COPD (chronic obstructive pulmonary disease) (HCC)    Depression    Hepatitis C 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 10/24/2021   RNA negative.  Hep C false positive.     Hypoxia 02/01/2011   Medical history non-contributory    Needs sleep apnea assessment 2013.07.31   Tobacco abuse     Tobacco History: Social History   Tobacco Use  Smoking Status Every Day   Current packs/day: 0.00   Average packs/day: 0.5 packs/day for 25.0 years (12.5 ttl pk-yrs)   Types: Cigarettes   Start date: 11/11/1996   Last attempt to quit: 11/11/2021   Years since quitting: 1.4  Smokeless Tobacco Never  Tobacco Comments   Pt states she stopped smoking when she got out of hospital. ALS 10/20   Ready to quit: Not Answered Counseling given: Not Answered Tobacco comments: Pt  states she stopped smoking when she got out of hospital. ALS 10/20   Outpatient Medications Prior to Visit  Medication Sig Dispense Refill   albuterol (VENTOLIN HFA) 108 (90 Base) MCG/ACT inhaler INHALE 1 TO 2 PUFFS BY MOUTH EVERY 6 HOURS AS NEEDED FOR WHEEZING OR SHORTNESS OF BREATH . 9 g 0   albuterol (VENTOLIN HFA) 108 (90 Base) MCG/ACT inhaler Inhale 2 puffs into the lungs every 4 (four) hours as needed. 18 g 2   ALPRAZolam (XANAX) 0.25 MG tablet Take 1 tablet (0.25 mg total) by mouth 2 (two) times daily as needed for anxiety or sleep. 30 tablet 0   amoxicillin-clavulanate (AUGMENTIN) 875-125 MG tablet Take 1 tablet by mouth 2 (two) times daily. 20 tablet 0   budesonide-formoterol  (SYMBICORT) 160-4.5 MCG/ACT inhaler Inhale 2 puffs into the lungs 2 (two) times daily.     cyclobenzaprine (FLEXERIL) 10 MG tablet Take 1 tablet (10 mg total) by mouth 3 (three) times daily as needed for muscle spasms. 30 tablet 0   FLUoxetine (PROZAC) 20 MG tablet Take 1 tablet (20 mg total) by mouth daily. 90 tablet 3   gabapentin (NEURONTIN) 300 MG capsule TAKE 1 CAPSULE BY MOUTH ONCE DAILY AS NEEDED FOR PAIN 90 capsule 0   levothyroxine (SYNTHROID) 175 MCG tablet Take 1 tablet (175 mcg total) by mouth daily. 90 tablet 0   meclizine (ANTIVERT) 25 MG tablet Take 1 tablet (25 mg total) by mouth 3 (three) times daily as needed for dizziness. 30 tablet 0   meloxicam (MOBIC) 7.5 MG tablet Take 1 tablet (7.5 mg total) by mouth 2 (two) times daily. 180 tablet 0   oxyCODONE-acetaminophen (PERCOCET/ROXICET) 5-325 MG tablet Take 1 tablet by mouth every 6 (six) hours as needed for severe pain (pain score 7-10). (Patient taking differently: Take 1 tablet by mouth every 6 (six) hours as needed for severe pain (pain score 7-10). Taking 10mg ) 20 tablet 0   predniSONE (DELTASONE) 20 MG tablet Take two tablets once daily for five days 10 tablet 0   Tiotropium Bromide Monohydrate (SPIRIVA RESPIMAT) 2.5 MCG/ACT AERS Inhale 2 puffs into the lungs daily. 1 each 5   varenicline (CHANTIX CONTINUING MONTH PAK) 1 MG tablet Take 1 tablet (1 mg total) by mouth 2 (two) times daily. 60 tablet 1   Varenicline Tartrate, Starter, (CHANTIX STARTING MONTH PAK) 0.5 MG X 11 & 1 MG X 42 TBPK Take one 0.5 mg tablet by mouth once daily for 3 days, then increase to one 0.5 mg tablet twice daily for 4 days, then increase to one 1 mg tablet twice daily. 1 each 0   ipratropium-albuterol (DUONEB) 0.5-2.5 (3) MG/3ML SOLN Take 3 mLs by nebulization every 6 (six) hours as needed. 45 mL 0   Facility-Administered Medications Prior to Visit  Medication Dose Route Frequency Provider Last Rate Last Admin   ipratropium-albuterol (DUONEB) 0.5-2.5 (3)  MG/3ML nebulizer solution 3 mL  3 mL Nebulization Q6H Glenford Bayley, NP   3 mL at 11/17/21 1357    Review of Systems  Review of Systems  Constitutional: Negative.   HENT:  Positive for congestion.   Respiratory:  Positive for cough, shortness of breath and wheezing.   Cardiovascular:  Positive for leg swelling.    Physical Exam  Ht 5\' 5"  (1.651 m)   Wt 196 lb 6.4 oz (89.1 kg)   LMP 08/22/2022 (Approximate)   BMI 32.68 kg/m  Physical Exam Constitutional:      General: She is not in  acute distress.    Appearance: Normal appearance. She is not ill-appearing or toxic-appearing.  HENT:     Mouth/Throat:     Mouth: Mucous membranes are moist.     Pharynx: Oropharynx is clear.  Cardiovascular:     Rate and Rhythm: Normal rate and regular rhythm.     Comments: Trace pedal edema Pulmonary:     Effort: Pulmonary effort is normal.     Breath sounds: Wheezing and rhonchi present.  Musculoskeletal:        General: Normal range of motion.  Skin:    General: Skin is warm and dry.  Neurological:     General: No focal deficit present.     Mental Status: She is alert and oriented to person, place, and time. Mental status is at baseline.  Psychiatric:        Mood and Affect: Mood normal.        Behavior: Behavior normal.        Thought Content: Thought content normal.        Judgment: Judgment normal.      Lab Results:  CBC    Component Value Date/Time   WBC 6.0 05/07/2023 1125   RBC 4.48 05/07/2023 1125   HGB 11.7 (L) 05/07/2023 1125   HGB 11.6 (L) 05/13/2013 0449   HCT 37.8 05/07/2023 1125   HCT 36.2 05/13/2013 0449   PLT 319 05/07/2023 1125   PLT 264 05/13/2013 0449   MCV 84.4 05/07/2023 1125   MCV 82 05/13/2013 0449   MCH 26.1 05/07/2023 1125   MCHC 31.0 05/07/2023 1125   RDW 14.4 05/07/2023 1125   RDW 15.3 (H) 05/13/2013 0449   LYMPHSABS 2.5 05/07/2023 1125   LYMPHSABS 2.4 05/13/2013 0449   MONOABS 0.4 05/07/2023 1125   MONOABS 0.9 05/13/2013 0449    EOSABS 0.3 05/07/2023 1125   EOSABS 0.0 05/13/2013 0449   BASOSABS 0.0 05/07/2023 1125   BASOSABS 0.0 05/13/2013 0449    BMET    Component Value Date/Time   NA 135 05/07/2023 1125   NA 136 05/11/2013 0341   K 3.9 05/07/2023 1125   K 4.1 05/11/2013 0341   CL 101 05/07/2023 1125   CL 106 05/11/2013 0341   CO2 25 05/07/2023 1125   CO2 27 05/11/2013 0341   GLUCOSE 85 05/07/2023 1125   GLUCOSE 143 (H) 05/11/2013 0341   BUN 15 05/07/2023 1125   BUN 12 05/11/2013 0341   CREATININE 0.77 05/07/2023 1125   CREATININE 0.71 05/15/2013 0629   CALCIUM 9.6 05/07/2023 1125   CALCIUM 8.7 05/11/2013 0341   GFRNONAA >60 05/07/2023 1125   GFRNONAA >60 05/15/2013 0629   GFRAA >60 10/25/2019 0739   GFRAA >60 05/15/2013 0629    BNP    Component Value Date/Time   BNP 9.0 04/10/2022 0903    ProBNP    Component Value Date/Time   PROBNP 106.5 01/28/2011 2052    Imaging: CT Chest W Contrast Result Date: 05/10/2023 CLINICAL DATA:  Dyspnea, ex-smoker. Increased wheezing. Clinical concern for a nodule or mass in the lungs. EXAM: CT CHEST WITH CONTRAST TECHNIQUE: Multidetector CT imaging of the chest was performed during intravenous contrast administration. RADIATION DOSE REDUCTION: This exam was performed according to the departmental dose-optimization program which includes automated exposure control, adjustment of the mA and/or kV according to patient size and/or use of iterative reconstruction technique. CONTRAST:  80mL OMNIPAQUE IOHEXOL 300 MG/ML  SOLN COMPARISON:  Portable chest dated 05/07/2023. Chest CTA dated 11/12/2021. FINDINGS: Cardiovascular: Enlarged main  pulmonary artery with an interval decrease in size, currently measuring 3.5 cm in maximum transverse diameter, previously 4.0 cm. Interval borderline enlarged heart. No pericardial fluid. Mediastinum/Nodes: No enlarged mediastinal, hilar, or axillary lymph nodes. Thyroid gland, trachea, and esophagus demonstrate no significant findings.  Lungs/Pleura: Mild bilateral upper lobe, bilateral lower lobe, right middle lobe and lingular atelectasis/scarring, with improvement. Resolved patchy airspace opacity in the right lower lobe. One of the previously demonstrated 5 mm ground-glass nodular densities in the right upper lobe is no longer present. The other is smaller and more solid-appearing, measuring 3 mm on image number 42/3. This does not need imaging follow-up. No interval lung masses or nodules. Interval mild right middle lobe bullous change. No pleural fluid. Upper Abdomen: Unremarkable. Musculoskeletal: Mild thoracic and lower cervical spine degenerative changes. IMPRESSION: 1. Interval borderline cardiomegaly. 2. Interval decrease in size of the enlarged main pulmonary artery, suggesting improved pulmonary arterial hypertension. 3. Interval improvement in bilateral atelectasis/scarring. 4. Resolved pneumonia in the right lower lobe. 5. One of the previously demonstrated 5 mm ground-glass nodular densities in the right upper lobe is no longer present. The other is smaller and more solid-appearing, measuring 3 mm. This does not need imaging follow-up. 6. Mild emphysematous change in the right middle lobe. Emphysema (ICD10-J43.9). Electronically Signed   By: Beckie Salts [name]   On: 05/10/2023 15:53   MR BRAIN WO CONTRAST Result Date: 05/07/2023 CLINICAL DATA:  Gait changes EXAM: MRI HEAD WITHOUT CONTRAST TECHNIQUE: Multiplanar, multiecho pulse sequences of the brain and surrounding structures were obtained without intravenous contrast. COMPARISON:  None Available. FINDINGS: Brain: No acute infarct, mass effect or extra-axial collection. No acute or chronic hemorrhage. Minimal multifocal hyperintense T2-weight signal within the white matter. The midline structures are normal. Vascular: Normal flow voids. Skull and upper cervical spine: Normal calvarium and skull base. Visualized upper cervical spine and soft tissues are normal.  Sinuses/Orbits:Right maxillary sinus retention cyst. Trace bilateral mastoid fluid. Mild paranasal sinus mucosal thickening. Normal orbits. IMPRESSION: 1. No acute intracranial abnormality. 2. Minimal multifocal hyperintense T2-weight signal within the white matter, nonspecific but may be seen in the setting of migraine headaches or early chronic small vessel ischemia. Electronically Signed   By: Deatra Robinson [name]   On: 05/07/2023 15:57   DG Chest Port 1 View Result Date: 05/07/2023 CLINICAL DATA:  Altered level of consciousness EXAM: PORTABLE CHEST 1 VIEW COMPARISON:  04/30/2023 FINDINGS: The heart size and mediastinal contours are within normal limits. No focal airspace consolidation, pleural effusion, or pneumothorax. The visualized skeletal structures are unremarkable. IMPRESSION: No active disease. Electronically Signed   By: Duanne Guess D.O.   On: 05/07/2023 13:14   CT Head Wo Contrast Result Date: 05/07/2023 CLINICAL DATA:  Provided history: Neuro deficit, acute, stroke suspected. Additional history provided: Dizziness. EXAM: CT HEAD WITHOUT CONTRAST TECHNIQUE: Contiguous axial images were obtained from the base of the skull through the vertex without intravenous contrast. RADIATION DOSE REDUCTION: This exam was performed according to the departmental dose-optimization program which includes automated exposure control, adjustment of the mA and/or kV according to patient size and/or use of iterative reconstruction technique. COMPARISON:  Head CT 01/21/2007. FINDINGS: Brain: No age-advanced or lobar predominant cerebral atrophy. Partially empty sella turcica. There is no acute intracranial hemorrhage. No demarcated cortical infarct. No extra-axial fluid collection. No evidence of an intracranial mass. No midline shift. Vascular: No hyperdense vessel. Skull: No calvarial fracture or aggressive osseous lesion. Sinuses/Orbits: No mass or acute finding within the imaged orbits. Partially  imaged mucous  retention cyst or polyp within the right maxillary sinus, measuring at least 2.6 cm. Mild background mucosal thickening within the right maxillary sinus at the imaged levels. Mild mucosal thickening within the left maxillary sinus at the imaged levels. Trace mucosal thickening within the left sphenoid sinus. Mild-to-moderate mucosal thickening within the bilateral ethmoid sinuses. Mild mucosal thickening within the bilateral frontal sinuses. Other: Trace fluid within left mastoid air cells. IMPRESSION: 1. No evidence of an acute intracranial abnormality. 2. Partially empty sella turcica. This finding can reflect incidental anatomic variation, or alternatively, it can be associated with chronic idiopathic intracranial hypertension (pseudotumor cerebri). 3. Paranasal sinus disease at the imaged levels, as outlined. 4. Trace left mastoid effusion. Electronically Signed   By: Jackey Loge D.O.   On: 05/07/2023 12:49   DG Chest 2 View Result Date: 04/30/2023 CLINICAL DATA:  Wheezing on exam. EXAM: CHEST - 2 VIEW COMPARISON:  09/08/2022 FINDINGS: The heart size and mediastinal contours are within normal limits. Both lungs are clear. The visualized skeletal structures are unremarkable. IMPRESSION: No active cardiopulmonary disease. Electronically Signed   By: Danae Orleans [name]   On: 04/30/2023 15:34     Assessment & Plan:   1. At risk for sleep apnea (Primary) - Home sleep test; Future  2. Shortness of breath - Brain natriuretic peptide; Future - Brain natriuretic peptide  3. COPD mixed type (HCC) - POCT EXHALED NITRIC OXIDE - Flutter valve; Future  Assessment and Plan    Chronic Obstructive Pulmonary Disease (COPD) with acute exacerbation  Stage 3 COPD with mild emphysema. Previous non-compliance with Symbicort and Spiriva noted. Dyspnea with activity, nocturnal oxygen use, excessive albuterol inhaler use.  - Continue Symbicort and Spiriva as prescribed. - Use Budesonide nebulizer 0.5mg /2ML twice  daily as needed for wheezing along with Duo Nebs 3-4 times a day. - Taper prednisone: 40 mg for 5 days, then 30 mg for 3 days, 20 mg for 3 days, 10 mg for 3 days. - Use Mucinex 1200 mg twice a day. - Use flutter valve three times a day.  Wheezing CT scan negative for acute pulmonary issues; mild cardiomegaly, improved pulmonary hypertension and previous pneumonia.  - Awaiting echocardiogram to assess heart function. - Order brain natriuretic peptide test to evaluate for heart failure.  At risk for sleep apnea/loud snoring - Order home sleep study to assess for sleep apnea. - Continue using oxygen at night and as needed during the day.  Bronchitis Treated with Augmentin and prednisone. Reports productive cough with yellow sputum and wheezing. Wheezing possibly due to bronchitis or fluid overload from potential heart failure. - Continue Augmentin as prescribed. - Use Delsym for cough suppression as needed. - Use Mucinex to thin mucus and aid expectoration.  Vertigo - Referred to ENT   Smoking Cessation Currently working on quitting smoking. Started Chantix. Acknowledged challenge due to stress from family illness. - Continue Chantix for smoking cessation. - Encourage reduction in smoking.  Follow-up No follow-up appointment scheduled. Pulmonologist to coordinate care. - Schedule follow-up appointment with pulmonologist or primary care physician. - Recheck blood pressure.     Glenford Bayley, NP 05/11/2023

## 2023-05-11 NOTE — Patient Instructions (Addendum)
 -  CHRONIC OBSTRUCTIVE PULMONARY DISEASE (COPD): COPD is a chronic lung disease that makes it hard to breathe. You should continue using Symbicort and Spiriva as prescribed morning and evening (rinse mouth after use). Start using budesonide nebulizer twice daily for wheezing and use ipratropium-albuterol (Duo Nebs) 3-4 times a day for shortness of breath. Taper your prednisone as follows: 40 mg for 5 days, then 30 mg for 3 days, 20 mg for 3 days, and 10 mg for 3 days. Take Mucinex 1200 mg twice a day and use the flutter valve three times a day. You will be referred to an ENT specialist for your sinus congestion.  -DIZZINESS AND WHEEZING: Your dizziness and wheezing may be related to COPD, heart failure, or untreated sleep apnea. We will order an echocardiogram to check your heart function, a brain natriuretic peptide test to evaluate for heart failure, and a home sleep study to assess for sleep apnea. Continue using oxygen at night and as needed during the day.  -BRONCHITIS: Bronchitis is an inflammation of the bronchial tubes in the lungs. Continue taking Augmentin as prescribed. Use Delsym for cough suppression as needed and Mucinex to thin mucus and help with coughing it up.  -POTENTIAL HEART FAILURE: Heart failure means your heart isn't pumping blood as well as it should. We will order a brain natriuretic peptide test to evaluate for heart failure.  -SMOKING CESSATION: Quitting smoking is crucial for your lung health. Continue using Chantix to help you quit smoking and try to reduce the number of cigarettes you smoke.  Orders: Recheck BP FENO re: cough Flutter valve  Mucinex samples  Labs today   Follow-up 7-10 days with Waynetta Sandy NP or PCP

## 2023-05-11 NOTE — Progress Notes (Signed)
 Pt seen by pulmonary today.   Advise pt  Borderline cardiomegaly (enlarged heart) we are getting that u/s (echocardiogram) so this will be helpful in that degree.   There does seem to be some improvement in pulmonary arterial hypertension, cont with pulmonary.   Not currently with pneumonia, shown resolution.   Ground glass opacity 3 mm, no need for f/u.  There are some mild emphysematous changes in the right middle lobe. Overall good findings.

## 2023-05-15 ENCOUNTER — Telehealth: Payer: Self-pay | Admitting: Family

## 2023-05-15 NOTE — Telephone Encounter (Signed)
 Paperwork complete Please print out office note from both me and pulmonary last visit.  Print med list and CT chest results 05/10/23

## 2023-05-15 NOTE — Telephone Encounter (Signed)
 Copied from CRM [id number]. Topic: General - Other >> May 15, 2023 10:46 AM [place] J wrote: Reason for CRM: Gala Murdoch calling from [facility] regarding orders that were placed for an echo to be completed. More specification on this order is needed: [phone]

## 2023-05-15 NOTE — Telephone Encounter (Signed)
 Spoke with Tanisha at the [facility]. She wanted to know if the pt needs to be seen at their clinic or if the pt just needs an echo. From looking in the chart, it looks like the pt just needed an echo. This information has been relayed to [place].

## 2023-05-15 NOTE — Telephone Encounter (Signed)
 Forms have been faxed in to [facility]. Pt is aware.

## 2023-05-16 NOTE — Progress Notes (Signed)
 Pt called stating she was returning a call about a referral. Reviewed pt's chart. Referring provider just needs an echo for pt. Pt does not need to be seen by Heart Failure. Called [facility] and had echo scheduled. Notified pt of appt and where to check in for this. Pt had good understanding and no further questions.

## 2023-05-21 ENCOUNTER — Encounter

## 2023-05-21 DIAGNOSIS — Z9189 Other specified personal risk factors, not elsewhere classified: Secondary | ICD-10-CM

## 2023-05-29 MED ORDER — NEBULIZER SYSTEM ALL-IN-ONE MISC
1.0000 | Freq: Every day | 0 refills | Status: DC | PRN
Start: 2023-05-29 — End: 2023-05-31

## 2023-05-29 NOTE — Addendum Note (Signed)
 Addended by: Felicita Horns on: 05/29/2023 02:55 PM   Modules accepted: Orders

## 2023-05-30 ENCOUNTER — Telehealth: Payer: Self-pay | Admitting: Family

## 2023-05-30 NOTE — Telephone Encounter (Signed)
 Patient dropped off FMLA ppwk to be completed.  Placed in Erin's Box to be completed

## 2023-05-30 NOTE — Telephone Encounter (Signed)
 Noted.

## 2023-05-31 MED ORDER — NEBULIZER SYSTEM ALL-IN-ONE MISC: 1.0000 | Freq: Every day | 0 refills | Status: DC | PRN

## 2023-05-31 NOTE — Addendum Note (Signed)
 Addended by: Felicita Horns on: 05/31/2023 01:26 PM   Modules accepted: Orders

## 2023-06-04 NOTE — Telephone Encounter (Signed)
 Continued short term disability benefits forms received filled out from patient. Placed in providers box for review.

## 2023-06-05 ENCOUNTER — Telehealth: Payer: Self-pay | Admitting: Family

## 2023-06-05 NOTE — Telephone Encounter (Signed)
 I reached out to the patient, she requests we mail the forms to [employer] and mail a copy to her as well.  Completed FMLA paperwork has been mailed to: [employer] Claims department [address] [id number] [place], [place] [address] Attn: Yisroel Hemp (examiner)  A copy has been sent to scan into patients chart.

## 2023-06-05 NOTE — Telephone Encounter (Signed)
 FMLA completed in outbox Needs printed out me list and recent office visit

## 2023-06-05 NOTE — Telephone Encounter (Signed)
 OV note and med list have been attached to the pt's forms. Folder has been given back to JoEllen/Erin to process.

## 2023-06-06 MED ORDER — NEBULIZER SYSTEM ALL-IN-ONE MISC: 1.0000 | Freq: Every day | 0 refills | Status: AC | PRN

## 2023-06-06 NOTE — Telephone Encounter (Signed)
 Do you think maybe this has to be a DME order and or have to be faxed in?

## 2023-06-06 NOTE — Addendum Note (Signed)
 Addended by: Dewanda Foots C on: 06/06/2023 02:38 PM   Modules accepted: Orders

## 2023-06-06 NOTE — Telephone Encounter (Signed)
 Nebulizer has been resent to [name]. Pt is aware. Nothing further was needed.

## 2023-06-11 ENCOUNTER — Ambulatory Visit: Admitting: Family

## 2023-06-11 ENCOUNTER — Encounter: Payer: Self-pay | Admitting: Family

## 2023-06-11 ENCOUNTER — Other Ambulatory Visit: Payer: Self-pay | Admitting: Family

## 2023-06-11 VITALS — BP 110/72 | HR 76 | Temp 97.9°F | Ht 64.0 in | Wt 193.0 lb

## 2023-06-11 DIAGNOSIS — J349 Unspecified disorder of nose and nasal sinuses: Secondary | ICD-10-CM | POA: Diagnosis not present

## 2023-06-11 DIAGNOSIS — E039 Hypothyroidism, unspecified: Secondary | ICD-10-CM

## 2023-06-11 DIAGNOSIS — J449 Chronic obstructive pulmonary disease, unspecified: Secondary | ICD-10-CM | POA: Diagnosis not present

## 2023-06-11 DIAGNOSIS — R42 Dizziness and giddiness: Secondary | ICD-10-CM

## 2023-06-11 DIAGNOSIS — Z720 Tobacco use: Secondary | ICD-10-CM

## 2023-06-11 LAB — TSH: TSH: 18.38 u[IU]/mL — ABNORMAL HIGH (ref 0.35–5.50)

## 2023-06-11 MED ORDER — VARENICLINE TARTRATE (STARTER) 0.5 MG X 11 & 1 MG X 42 PO TBPK: ORAL_TABLET | ORAL | 0 refills | Status: DC

## 2023-06-11 NOTE — Assessment & Plan Note (Signed)
 No real improvement with current treatment  Pt advised to f/u with pulmonary for further recommendations.  Continue claritin mucinex  prn

## 2023-06-11 NOTE — Patient Instructions (Addendum)
  Start b12 1000 mcg once daily   ------------------------------------  Call this number to get set up with an appointment with ENT  Address: [address] [place], [place] [address] Phone: [phone]   ------------------------------------  Call pulmonary to reschedule appt.  Let them know you are still having trouble with your COPD

## 2023-06-11 NOTE — Progress Notes (Signed)
 Established Patient Office Visit  Subjective:      CC:  Chief Complaint  Patient presents with   Medical Management of Chronic Issues    HPI: [name] is a 48 y.o. female presenting on 06/11/2023 for Medical Management of Chronic Issues  COPD,  Still sob and wheezing with movement or 'doing something' she is taking symbicort  and also spiriva .   Tobacco use, did start her chantix  and had reduced her cigarettes by about 50% however she lost it and was without for about two weeks and she got the urge back to smoke more. She does again have chantix  but was curious if she can just resume the dose she was on.   Sleep study, recently completed about one week ago pending results.  She had one ordered by me as well as her pulmonologist and isn't sure which one she had completed.   Rheumatology appointment May 8th for consult.  Echocardiogram scheduled in Jun 18 2023 to be completed.  Vascular ABI scheduled in may as well per pt.  Vascular surgeon f/u 06/27/23  Paranasal sinus disease, referred to ENT but pt states has not received a phone call to get scheduled. 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  Referral says referred to Dr. Virgina Grills.   Still on STD, started 05/08/23 anticipated RTW work date 07/10/23      Social history:  Relevant past medical, surgical, family and social history reviewed and updated as indicated. Interim medical history since our last visit reviewed.  Allergies and medications reviewed and updated.  DATA REVIEWED: CHART IN EPIC     ROS: Negative unless specifically indicated above in HPI.    Current Outpatient Medications:    Varenicline  Tartrate, Starter, (CHANTIX  STARTING MONTH PAK) 0.5 MG X 11 & 1 MG X 42 TBPK, Take one 0.5 mg tablet by mouth once daily for 3 days, then increase to one 0.5 mg tablet twice daily for 4  days, then increase to one 1 mg tablet twice daily., Disp: 1 each, Rfl: 0   albuterol  (VENTOLIN  HFA) 108 (90 Base) MCG/ACT inhaler, INHALE 1 TO 2 PUFFS BY MOUTH EVERY 6 HOURS AS NEEDED FOR WHEEZING OR SHORTNESS OF BREATH ., Disp: 9 g, Rfl: 0   albuterol  (VENTOLIN  HFA) 108 (90 Base) MCG/ACT inhaler, Inhale 2 puffs into the lungs every 4 (four) hours as needed., Disp: 18 g, Rfl: 2   ALPRAZolam  (XANAX ) 0.25 MG tablet, Take 1 tablet (0.25 mg total) by mouth 2 (two) times daily as needed for anxiety or sleep., Disp: 30 tablet, Rfl: 0   azelastine  (ASTELIN ) 0.1 % nasal spray, Place 1 spray into both nostrils 2 (two) times daily. Use in each nostril as directed, Disp: 30 mL, Rfl: 1   budesonide  (PULMICORT ) 0.5 MG/2ML nebulizer solution, Take 2 mLs (0.5 mg total) by nebulization 2 (two) times daily as needed (Wheezing)., Disp: 60 mL, Rfl: 0   budesonide -formoterol  (SYMBICORT ) 160-4.5 MCG/ACT inhaler, Inhale 2 puffs into the lungs 2 (two) times daily., Disp: , Rfl:    cyclobenzaprine  (FLEXERIL ) 10 MG tablet, Take 1 tablet (10 mg total) by mouth 3 (three) times daily as needed for muscle spasms., Disp: 30 tablet, Rfl: 0   FLUoxetine  (PROZAC ) 20 MG tablet, Take 1 tablet (20 mg total) by mouth daily., Disp: 90 tablet, Rfl: 3   gabapentin  (NEURONTIN ) 300 MG capsule, TAKE 1 CAPSULE  BY MOUTH ONCE DAILY AS NEEDED FOR PAIN, Disp: 90 capsule, Rfl: 0   guaiFENesin  (MUCINEX ) 600 MG 12 hr tablet, Take 2 tablets (1,200 mg total) by mouth 2 (two) times daily as needed for cough or to loosen phlegm., Disp: 30 tablet, Rfl: 1   levothyroxine  (SYNTHROID ) 175 MCG tablet, Take 1 tablet (175 mcg total) by mouth daily., Disp: 90 tablet, Rfl: 0   meclizine  (ANTIVERT ) 25 MG tablet, Take 1 tablet (25 mg total) by mouth 3 (three) times daily as needed for dizziness., Disp: 30 tablet, Rfl: 0   meloxicam  (MOBIC ) 7.5 MG tablet, Take 1 tablet (7.5 mg total) by mouth 2 (two) times daily., Disp: 180 tablet, Rfl: 0   Nebulizer System  All-In-One MISC, 1 Device by Does not apply route daily as needed., Disp: 1 each, Rfl: 0   oxyCODONE -acetaminophen  (PERCOCET/ROXICET) 5-325 MG tablet, Take 1 tablet by mouth every 6 (six) hours as needed for severe pain (pain score 7-10). (Patient taking differently: Take 1 tablet by mouth every 6 (six) hours as needed for severe pain (pain score 7-10). Taking 10mg ), Disp: 20 tablet, Rfl: 0   predniSONE  (DELTASONE ) 10 MG tablet, 30 mg for 3 days, 20 mg for 3 days, 10 mg for 3 days, Disp: 18 tablet, Rfl: 0   Tiotropium Bromide Monohydrate  (SPIRIVA  RESPIMAT) 2.5 MCG/ACT AERS, Inhale 2 puffs into the lungs daily., Disp: 1 each, Rfl: 5   varenicline  (CHANTIX  CONTINUING MONTH PAK) 1 MG tablet, Take 1 tablet (1 mg total) by mouth 2 (two) times daily., Disp: 60 tablet, Rfl: 1  Current Facility-Administered Medications:    ipratropium-albuterol  (DUONEB) 0.5-2.5 (3) MG/3ML nebulizer solution 3 mL, 3 mL, Nebulization, Q6H, Antonio Baumgarten, NP, 3 mL at 11/17/21 1357      Objective:    BP 110/72 (BP Location: Left Arm, Patient Position: Sitting, Cuff Size: Normal)   Pulse 76   Temp 97.9 F (36.6 C) (Temporal)   Ht 5\' 4"  (1.626 m)   Wt 193 lb (87.5 kg)   LMP 08/22/2022 (Approximate)   SpO2 93%   BMI 33.13 kg/m   Wt Readings from Last 3 Encounters:  06/11/23 193 lb (87.5 kg)  05/11/23 196 lb 6.4 oz (89.1 kg)  05/10/23 195 lb 9.6 oz (88.7 kg)    Physical Exam Vitals reviewed.  Constitutional:      General: She is not in acute distress.    Appearance: Normal appearance. She is normal weight. She is not ill-appearing, toxic-appearing or diaphoretic.  HENT:     Head: Normocephalic.     Right Ear: Tympanic membrane normal.     Left Ear: Tympanic membrane normal.     Nose: Nose normal.     Mouth/Throat:     Mouth: Mucous membranes are dry.     Pharynx: No oropharyngeal exudate or posterior oropharyngeal erythema.  Eyes:     Extraocular Movements: Extraocular movements intact.     Pupils:  Pupils are equal, round, and reactive to light.  Cardiovascular:     Rate and Rhythm: Normal rate and regular rhythm.     Pulses: Normal pulses.     Heart sounds: Normal heart sounds.  Pulmonary:     Effort: Pulmonary effort is normal.     Breath sounds: Decreased air movement present. Wheezing (all quadrants) present.  Musculoskeletal:     Cervical back: Normal range of motion.  Neurological:     General: No focal deficit present.     Mental Status: She is alert and oriented  to person, place, and time. Mental status is at baseline.  Psychiatric:        Mood and Affect: Mood normal.        Behavior: Behavior normal.        Thought Content: Thought content normal.        Judgment: Judgment normal.           Assessment & Plan:  Acquired hypothyroidism Assessment & Plan: Repeat tsh today pending results Continue levothyroxine  175 mcg once daily   Orders: -     TSH  Tobacco abuse -     Varenicline  Tartrate (Starter); Take one 0.5 mg tablet by mouth once daily for 3 days, then increase to one 0.5 mg tablet twice daily for 4 days, then increase to one 1 mg tablet twice daily.  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  Continue claritin mucinex  prn     Paranasal sinus disease Assessment & Plan: Advised pt of number to call for ENT to get set up  Did advise dizziness can also be contributed by sinus disease Continue claritin continue flonase      Return in about 1 month (around 07/11/2023) for f/u short term disability .  Felicita Horns, MSN, APRN, FNP-C [facility]

## 2023-06-11 NOTE — Assessment & Plan Note (Signed)
 Repeat tsh today pending results Continue levothyroxine  175 mcg once daily

## 2023-06-11 NOTE — Assessment & Plan Note (Signed)
 Advised pt of number to call for ENT to get set up  Did advise dizziness can also be contributed by sinus disease Continue claritin continue flonase

## 2023-06-12 ENCOUNTER — Other Ambulatory Visit: Payer: Self-pay | Admitting: Family

## 2023-06-12 ENCOUNTER — Encounter: Payer: Self-pay | Admitting: Family

## 2023-06-12 DIAGNOSIS — E039 Hypothyroidism, unspecified: Secondary | ICD-10-CM

## 2023-06-12 MED ORDER — SYNTHROID 200 MCG PO TABS: 200.0000 ug | ORAL_TABLET | Freq: Every day | ORAL | 1 refills | Status: DC

## 2023-06-12 NOTE — Telephone Encounter (Signed)
 [name], any recommendations we can do in the interim before she is able to get back in to your office? I saw her yesterday, dizziness/vertigo has improved some but her lungs still are full of wheeze, she is on both inhalers and still having to use her albuterol  several times a day. She had an appt May 7th but looks like it was cancelled by pulmonary office.  Also it may be too soon but any results of the sleep study ? I think the order ended up being used for your order vs mine, so I wasn't able to see.

## 2023-06-12 NOTE — Telephone Encounter (Signed)
 I'm not sure why her apt was cancelled. I am in office that day but now fully booked. It will be too soon for sleep study results most likely but needs to be seen for on going dyspnea.   Patient pulmonologist is Dr. Waymond Hailey (15 in slot) but has also seen Dr. Auston Left (30 min slot) in [place]. [name] do either of them have apts?

## 2023-06-13 NOTE — Telephone Encounter (Signed)
Thanks for looking into this.

## 2023-06-15 DIAGNOSIS — G4733 Obstructive sleep apnea (adult) (pediatric): Secondary | ICD-10-CM | POA: Diagnosis not present

## 2023-06-18 ENCOUNTER — Encounter: Payer: Self-pay | Admitting: Family

## 2023-06-18 ENCOUNTER — Ambulatory Visit
Admission: RE | Admit: 2023-06-18 | Discharge: 2023-06-18 | Disposition: A | Discharge status: Home / Self Care | Source: Ambulatory Visit | Attending: Family | Admitting: Family

## 2023-06-18 DIAGNOSIS — J449 Chronic obstructive pulmonary disease, unspecified: Secondary | ICD-10-CM | POA: Diagnosis present

## 2023-06-18 DIAGNOSIS — R0609 Other forms of dyspnea: Secondary | ICD-10-CM | POA: Diagnosis not present

## 2023-06-18 DIAGNOSIS — D649 Anemia, unspecified: Secondary | ICD-10-CM | POA: Insufficient documentation

## 2023-06-18 LAB — ECHOCARDIOGRAM COMPLETE
AR max vel: 3.71 cm2
AV Area VTI: 3.32 cm2
AV Area mean vel: 3.11 cm2
AV Mean grad: 3 mmHg
AV Peak grad: 5.8 mmHg
Ao pk vel: 1.2 m/s
Area-P 1/2: 4.57 cm2
MV VTI: 3.22 cm2
S' Lateral: 3.3 cm

## 2023-06-18 NOTE — Progress Notes (Signed)
*  PRELIMINARY RESULTS* Echocardiogram 2D Echocardiogram has been performed.  [name] 06/18/2023, 11:30 AM

## 2023-06-20 ENCOUNTER — Encounter (INDEPENDENT_AMBULATORY_CARE_PROVIDER_SITE_OTHER): Payer: Self-pay | Admitting: Nurse Practitioner

## 2023-06-20 ENCOUNTER — Encounter (INDEPENDENT_AMBULATORY_CARE_PROVIDER_SITE_OTHER): Payer: Self-pay

## 2023-06-20 ENCOUNTER — Ambulatory Visit: Admitting: Primary Care

## 2023-06-21 ENCOUNTER — Encounter: Payer: Self-pay | Admitting: Internal Medicine

## 2023-06-21 ENCOUNTER — Ambulatory Visit: Payer: BC Managed Care – PPO | Attending: Internal Medicine | Admitting: Internal Medicine

## 2023-06-21 VITALS — BP 109/79 | HR 71 | Resp 16 | Ht 64.0 in | Wt 195.8 lb

## 2023-06-21 DIAGNOSIS — M5442 Lumbago with sciatica, left side: Secondary | ICD-10-CM | POA: Diagnosis not present

## 2023-06-21 DIAGNOSIS — G8929 Other chronic pain: Secondary | ICD-10-CM

## 2023-06-21 DIAGNOSIS — R768 Other specified abnormal immunological findings in serum: Secondary | ICD-10-CM

## 2023-06-21 DIAGNOSIS — M5441 Lumbago with sciatica, right side: Secondary | ICD-10-CM

## 2023-06-21 DIAGNOSIS — M7989 Other specified soft tissue disorders: Secondary | ICD-10-CM | POA: Insufficient documentation

## 2023-06-21 NOTE — Progress Notes (Signed)
 Office Visit Note  Patient: [name]             Date of Birth: 1975-11-05           MRN: [id number]             PCP: Felicita Horns, FNP Referring: Felicita Horns, FNP Visit Date: 06/21/2023   Subjective:  New Patient (Initial Visit) (Abnormal labs would like to double check for Lupus)    Discussed the use of AI scribe software for clinical note transcription with the patient, who gave verbal consent to proceed.  History of Present Illness   [name] is a 48 year old female with joint pain and a family history of autoimmune disease who presents with chronic joints and abnormal labs with positive ANA, dsDNA, and elevated sedimentation rate.  She has been experiencing joint stiffness and swelling, particularly in her ankles and hands, for about a year. The stiffness is most pronounced upon waking and lasts for about an hour to an hour and a half. Ankle swelling worsens with prolonged standing, especially at work, and improves with rest. Her hands also swell and sometimes 'lock up' and hurt, especially during work activities.  She is currently taking meloxicam  and gabapentin  for her symptoms. Meloxicam  initially provided relief but is now less effective, while gabapentin  is used for back pain. She has previously been on prednisone  for COPD, which also improved her joint symptoms, but she is not currently taking it.  She has a history of spinal disruption and bulging discs, contributing to her back pain. She experiences severe leg pain, initially attributed to a cyst removal from her ankle, but recent x-rays showed no recurrence of the cyst.  She herself has Hashimoto's thyroiditis and is on thyroid  supplements, though her thyroid  levels have been unstable recently.  She has experienced dizziness, which has improved with medication, and has been out of work for about a month and a half due to these dizzy spells. She works in [employer], which involves long distances  and standing, contributing to her symptoms.  No throat pain, color changes in her hands, mouth or nose ulcers, and swollen lumps in the neck or underarms. She reports being fair-skinned and burning easily in the sun but does not experience sensitivity beyond that.      Labs reviewed ESR 51 ANA pos dsDNA 89 RF neg  Activities of Daily Living:  Patient reports morning stiffness for 1 hour.   Patient Reports nocturnal pain.  Difficulty dressing/grooming: Denies Difficulty climbing stairs: Reports Difficulty getting out of chair: Denies Difficulty using hands for taps, buttons, cutlery, and/or writing: Reports  Review of Systems  Constitutional:  Positive for fatigue.  HENT:  Positive for mouth dryness. Negative for mouth sores.   Eyes:  Negative for dryness.  Respiratory:  Positive for shortness of breath.   Cardiovascular:  Positive for chest pain. Negative for palpitations.  Gastrointestinal:  Negative for blood in stool, constipation and diarrhea.  Endocrine: Positive for increased urination.  Genitourinary:  Negative for involuntary urination.  Musculoskeletal:  Positive for joint pain, gait problem, joint pain, joint swelling, myalgias, muscle weakness, morning stiffness, muscle tenderness and myalgias.  Skin:  Negative for color change, rash, hair loss and sensitivity to sunlight.  Allergic/Immunologic: Positive for susceptible to infections.  Neurological:  Positive for dizziness. Negative for headaches.  Hematological:  Negative for swollen glands.  Psychiatric/Behavioral:  Positive for depressed mood and sleep disturbance. The patient is nervous/anxious.  PMFS History:  Patient Active Problem List   Diagnosis Date Noted   Positive ANA (antinuclear antibody) 06/21/2023   Leg swelling 06/21/2023   Peripheral vertigo of both ears 05/10/2023   Paranasal sinus disease 05/10/2023   DOE (dyspnea on exertion) 05/10/2023   Excessive daytime sleepiness 04/30/2023    Intermittent claudication (HCC) 04/30/2023   Long-term current use of proton pump inhibitor therapy 01/15/2023   History of alcohol  dependence (HCC) 01/05/2023   Chronic bilateral low back pain with bilateral sciatica 01/05/2023   Hypoxia, sleep related 01/05/2023   Spondylosis 01/05/2023   Family history of rheumatoid arthritis 01/05/2023   Elevated LDL cholesterol level 01/05/2023   Pulmonary arterial hypertension (HCC) 01/05/2023   Abnormal EKG 02/07/2022   COPD exacerbation (HCC) 11/12/2021   Vitamin D  deficiency 10/24/2021   Gastroesophageal reflux disease 10/24/2021   Hyperglycemia 10/24/2021   Snoring 10/24/2021   History of colon polyps 10/24/2021   Osteoarthritis 10/24/2021   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, still smoking 01/16/2020   Hypothyroidism 02/10/2016   Anemia 01/31/2011   Tobacco abuse 01/08/2011   Major depression 01/08/2011    Past Medical History:  Diagnosis Date   Alcohol  abuse    Anemia 01/31/2011   Asthma    COPD (chronic obstructive pulmonary disease) (HCC)    Depression    Hepatitis C 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 10/24/2021   RNA negative.  Hep C false positive.     Hypoxia 02/01/2011   Medical history non-contributory    Needs sleep apnea assessment 2013.07.31   Tobacco abuse    Vertigo 04/2023    Family History  Problem Relation Age of Onset   Stroke Mother    Hypothyroidism Mother    Coronary artery disease Mother    Throat cancer Mother    Hypertension Brother    Stroke Maternal Grandmother    Diabetes Maternal Grandmother    COPD Paternal Grandmother    Past Surgical History:  Procedure Laterality Date   ANKLE GANGLION CYST EXCISION     BACK SURGERY     CARPAL TUNNEL RELEASE Right    CESAREAN SECTION     COLONOSCOPY WITH PROPOFOL  N/A 06/27/2016   Procedure: COLONOSCOPY WITH PROPOFOL ;  Surgeon: Luke Salaam, MD;  Location: [facility] ENDOSCOPY;  Service: Endoscopy;  Laterality: N/A;   DILATION AND CURETTAGE OF UTERUS      ESOPHAGOGASTRODUODENOSCOPY (EGD) WITH PROPOFOL  N/A 06/27/2016   Procedure: ESOPHAGOGASTRODUODENOSCOPY (EGD) WITH PROPOFOL ;  Surgeon: Luke Salaam, MD;  Location: [facility] ENDOSCOPY;  Service: Endoscopy;  Laterality: N/A;   Social History   Social History Narrative   Son, 2 y/o    Immunization History  Administered Date(s) Administered   Influenza Inj Mdck Quad With Preservative 09/26/2021   Influenza,inj,Quad PF,6+ Mos 11/12/2014, 02/13/2016, 12/07/2016, 02/19/2020   Janssen (J&J) SARS-COV-2 Vaccination 07/21/2019     Objective: Vital Signs: BP 109/79 (BP Location: Right Arm, Patient Position: Sitting, Cuff Size: Normal)   Pulse 71   Resp 16   Ht 5\' 4"  (1.626 m)   Wt 195 lb 12.8 oz (88.8 kg)   LMP 08/22/2022 (Approximate)   BMI 33.61 kg/m    Physical Exam Eyes:     Conjunctiva/sclera: Conjunctivae normal.  Cardiovascular:     Rate and Rhythm: Normal rate and regular rhythm.  Pulmonary:     Effort: Pulmonary effort is normal.     Comments: Faint end expiratory wheezes Lymphadenopathy:     Cervical: No cervical adenopathy.  Skin:    General: Skin is warm and  dry.     Comments: Varicose veins  Neurological:     Mental Status: She is alert.  Psychiatric:        Mood and Affect: Mood normal.      Musculoskeletal Exam:  Shoulders full ROM no tenderness or swelling Elbows full ROM no tenderness or swelling Wrists full ROM no tenderness or swelling Fingers full ROM no tenderness or swelling Tight low back paraspinal muscles with some tenderness Hip normal internal and external rotation without pain, no tenderness to lateral hip palpation Knees full ROM no tenderness or swelling, b/l crepitus    Investigation: No additional findings.  Imaging: VAS US  ABI WITH/WO TBI Result Date: 06/27/2023  LOWER EXTREMITY DOPPLER STUDY Patient Name:  ROXINE HEIDBRINK  Date of Exam:   06/27/2023 Medical Rec #: [id number]             Accession #:    [id number] Date of Birth: 1975-02-16               Patient Gender: F Patient Age:   36 years Exam Location:  [facility] Procedure:      VAS US  ABI WITH/WO TBI Referring Phys: --------------------------------------------------------------------------------  Indications: Rest pain.  Performing Technologist: Faustine Hoof RVT  Examination Guidelines: A complete evaluation includes at minimum, Doppler waveform signals and systolic blood pressure reading at the level of bilateral brachial, anterior tibial, and posterior tibial arteries, when vessel segments are accessible. Bilateral testing is considered an integral part of a complete examination. Photoelectric Plethysmograph (PPG) waveforms and toe systolic pressure readings are included as required and additional duplex testing as needed. Limited examinations for reoccurring indications may be performed as noted.  ABI Findings: +---------+------------------+-----+---------+--------+ Right    Rt Pressure (mmHg)IndexWaveform Comment  +---------+------------------+-----+---------+--------+ Brachial 109                                      +---------+------------------+-----+---------+--------+ PTA      134               1.22 triphasic         +---------+------------------+-----+---------+--------+ DP       140               1.27 triphasic         +---------+------------------+-----+---------+--------+ Great Toe122               1.11 Normal            +---------+------------------+-----+---------+--------+ +---------+------------------+-----+---------+-------+ Left     Lt Pressure (mmHg)IndexWaveform Comment +---------+------------------+-----+---------+-------+ Brachial 110                                     +---------+------------------+-----+---------+-------+ PTA      144               1.31 triphasic        +---------+------------------+-----+---------+-------+ DP       145               1.32 triphasic         +---------+------------------+-----+---------+-------+ Great Toe121               1.10 Normal           +---------+------------------+-----+---------+-------+  Summary: Right: Resting right ankle-brachial index is within normal range. The right toe-brachial index is normal. Left: Resting  left ankle-brachial index is within normal range. The left toe-brachial index is normal. *See table(s) above for measurements and observations.  Electronically signed by Mikki Alexander MD on 06/27/2023 at 4:17:15 PM.    Final    US  THYROID  Result Date: 06/26/2023 CLINICAL DATA:  Hypothyroidism EXAM: THYROID  ULTRASOUND TECHNIQUE: Ultrasound examination of the thyroid  gland and adjacent soft tissues was performed. COMPARISON:  None Available. FINDINGS: Parenchymal Echotexture: Markedly heterogenous Isthmus: 6 mm Right lobe: 4.0 x 1.4 x 1.8 cm Left lobe: 3.5 x 1.3 x 1.4 cm _________________________________________________________ Estimated total number of nodules >/= 1 cm: 0 Number of spongiform nodules >/=  2 cm not described below (TR1): 0 Number of mixed cystic and solid nodules >/= 1.5 cm not described below (TR2): 0 _________________________________________________________ Markedly heterogeneous thyroid  gland compatible with chronic medical thyroid  disease. Incidental left mid thyroid  subcentimeter solid hyperechoic TR 3 type nodule measures only 6 mm. Given size (<1.4 cm) and appearance, this nodule does NOT meet TI-RADS criteria for biopsy or dedicated follow-up. No additional significant thyroid  abnormality. IMPRESSION: 1. Markedly heterogeneous thyroid  gland compatible with chronic medical thyroid  disease. Subcentimeter nodule noted as above. 2. No other significant finding by ultrasound. Electronically Signed   By: Melven Stable.  Shick [name]   On: 06/26/2023 15:18   ECHOCARDIOGRAM COMPLETE Result Date: 06/18/2023    ECHOCARDIOGRAM REPORT   Patient Name:   MERVIN SANDIFER Date of Exam: 06/18/2023 Medical Rec #:  [id number]             Height:       64.0 in Accession #:    [id number]           Weight:       193.0 lb Date of Birth:  07-28-1975             BSA:          1.927 m Patient Age:    47 years             BP:           110/72 mmHg Patient Gender: F                    HR:           76 bpm. Exam Location:  [facility] Procedure: 2D Echo, Cardiac Doppler, Color Doppler and Strain Analysis (Both            Spectral and Color Flow Doppler were utilized during procedure). Indications:     Dyspnea on exertion R06.09                  Dyspnea R06.00  History:         Patient has no prior history of Echocardiogram examinations.                  COPD. Anemia, tobacco use.  Sonographer:     Broadus Canes Referring Phys:  [id number] [name] DUGAL Diagnosing Phys: Antionette Kirks MD  Sonographer Comments: Global longitudinal strain was attempted. IMPRESSIONS  1. Left ventricular ejection fraction, by estimation, is 55 to 60%. The left ventricle has normal function. The left ventricle has no regional wall motion abnormalities. Left ventricular diastolic parameters were normal. The average left ventricular global longitudinal strain is -17.3 %. The global longitudinal strain is normal.  2. Right ventricular systolic function is normal. The right ventricular size is normal. There is normal pulmonary artery systolic pressure.  3. The mitral valve is normal in structure. No evidence  of mitral valve regurgitation. No evidence of mitral stenosis.  4. The aortic valve is normal in structure. Aortic valve regurgitation is not visualized. No aortic stenosis is present.  5. The inferior vena cava is normal in size with greater than 50% respiratory variability, suggesting right atrial pressure of 3 mmHg. FINDINGS  Left Ventricle: Left ventricular ejection fraction, by estimation, is 55 to 60%. The left ventricle has normal function. The left ventricle has no regional wall motion abnormalities. The average left ventricular global longitudinal strain is -17.3 %. Strain was  performed and the global longitudinal strain is normal. The left ventricular internal cavity size was normal in size. There is no left ventricular hypertrophy. Left ventricular diastolic parameters were normal. Right Ventricle: The right ventricular size is normal. No increase in right ventricular wall thickness. Right ventricular systolic function is normal. There is normal pulmonary artery systolic pressure. The tricuspid regurgitant velocity is 1.67 m/s, and  with an assumed right atrial pressure of 3 mmHg, the estimated right ventricular systolic pressure is 14.2 mmHg. Left Atrium: Left atrial size was normal in size. Right Atrium: Right atrial size was normal in size. Pericardium: There is no evidence of pericardial effusion. Mitral Valve: The mitral valve is normal in structure. No evidence of mitral valve regurgitation. No evidence of mitral valve stenosis. MV peak gradient, 4.2 mmHg. The mean mitral valve gradient is 2.0 mmHg. Tricuspid Valve: The tricuspid valve is normal in structure. Tricuspid valve regurgitation is not demonstrated. No evidence of tricuspid stenosis. Aortic Valve: The aortic valve is normal in structure. Aortic valve regurgitation is not visualized. No aortic stenosis is present. Aortic valve mean gradient measures 3.0 mmHg. Aortic valve peak gradient measures 5.8 mmHg. Aortic valve area, by VTI measures 3.32 cm. Pulmonic Valve: The pulmonic valve was normal in structure. Pulmonic valve regurgitation is not visualized. No evidence of pulmonic stenosis. Aorta: The aortic root is normal in size and structure. Venous: The inferior vena cava is normal in size with greater than 50% respiratory variability, suggesting right atrial pressure of 3 mmHg. IAS/Shunts: No atrial level shunt detected by color flow Doppler.  LEFT VENTRICLE PLAX 2D LVIDd:         5.50 cm   Diastology LVIDs:         3.30 cm   LV e' medial:    11.50 cm/s LV PW:         0.90 cm   LV E/e' medial:  8.7 LV IVS:        1.00  cm   LV e' lateral:   11.70 cm/s LVOT diam:     2.20 cm   LV E/e' lateral: 8.5 LV SV:         90 LV SV Index:   47        2D Longitudinal Strain LVOT Area:     3.80 cm  2D Strain GLS Avg:     -17.3 %  RIGHT VENTRICLE RV Basal diam:  1.90 cm RV Mid diam:    1.90 cm RV S prime:     11.00 cm/s TAPSE (M-mode): 2.6 cm LEFT ATRIUM           Index        RIGHT ATRIUM           Index LA diam:      3.80 cm 1.97 cm/m   RA Area:     11.20 cm LA Vol (A2C): 24.3 ml 12.61 ml/m  RA Volume:   21.10  ml  10.95 ml/m LA Vol (A4C): 51.0 ml 26.47 ml/m  AORTIC VALVE AV Area (Vmax):    3.71 cm AV Area (Vmean):   3.11 cm AV Area (VTI):     3.32 cm AV Vmax:           120.00 cm/s AV Vmean:          88.500 cm/s AV VTI:            0.271 m AV Peak Grad:      5.8 mmHg AV Mean Grad:      3.0 mmHg LVOT Vmax:         117.00 cm/s LVOT Vmean:        72.500 cm/s LVOT VTI:          0.237 m LVOT/AV VTI ratio: 0.87  AORTA Ao Root diam: 3.10 cm MITRAL VALVE               TRICUSPID VALVE MV Area (PHT): 4.57 cm    TR Peak grad:   11.2 mmHg MV Area VTI:   3.22 cm    TR Vmax:        167.00 cm/s MV Peak grad:  4.2 mmHg MV Mean grad:  2.0 mmHg    SHUNTS MV Vmax:       1.03 m/s    Systemic VTI:  0.24 m MV Vmean:      62.9 cm/s   Systemic Diam: 2.20 cm MV Decel Time: 166 msec MV E velocity: 99.60 cm/s MV A velocity: 68.10 cm/s MV E/A ratio:  1.46 Antionette Kirks MD Electronically signed by Antionette Kirks MD Signature Date/Time: 06/18/2023/11:45:05 AM    Final     Recent Labs: Lab Results  Component Value Date   WBC 6.0 05/07/2023   HGB 11.7 (L) 05/07/2023   PLT 319 05/07/2023   NA 135 05/07/2023   K 3.9 05/07/2023   CL 101 05/07/2023   CO2 25 05/07/2023   GLUCOSE 85 05/07/2023   BUN 15 05/07/2023   CREATININE 0.77 05/07/2023   BILITOT 0.4 05/07/2023   ALKPHOS 61 05/07/2023   AST 21 05/07/2023   ALT 17 05/07/2023   PROT 7.6 05/07/2023   ALBUMIN 4.3 05/07/2023   CALCIUM 9.6 05/07/2023   GFRAA >60 10/25/2019    Speciality Comments:  No specialty comments available.  Procedures:  No procedures performed Allergies: Patient has no known allergies.   Assessment / Plan:     Visit Diagnoses: Positive ANA (antinuclear antibody) - Plan: Sedimentation rate, C-reactive protein, C3 and C4, Anti-DNA antibody, double-stranded, Protein / creatinine ratio, urine, Cyclic citrul peptide antibody, IgG Chronic joint pain and stiffness, particularly in hands and ankles, with positive sedimentation rate and double-stranded DNA antibody test suggesting possible lupus. Differential includes osteoarthritis and lupus. - Recheck blood tests, including sedimentation rate and double-stranded DNA antibody. - Order additional tests specific to lupus. - Consider starting hydroxychloroquine if blood tests indicate autoimmune activity.  Joint pain and stiffness Degenerative disc disease Chronic back pain with muscle spasms and stiffness likely due to degenerative disc disease. Pain exacerbated by movement, relieved by rest. - Consider physical therapy to address muscle spasms and stiffness. - Avoid muscle relaxants due to dizziness.  Hashimoto's thyroiditis Under evaluation by endocrinologist due to fluctuating thyroid  hormone levels. Condition characterized by inflammation causing hormone level fluctuations, eventually leading to a more manageable state with stable medication dosing. May be associated with incidental positive ANA or with coexisting inflammatory condition.  Venous insufficiency Intermittent leg swelling,  particularly in ankles, likely due to venous insufficiency, possibly exacerbated by varicose veins. - Await vascular specialist evaluation and ultrasound study of the legs.  COPD COPD with expiratory wheeze managed with inhalers. Air movement generally good with some restriction during expiration.    Orders: Orders Placed This Encounter  Procedures   Sedimentation rate   C-reactive protein   C3 and C4   Anti-DNA antibody,  double-stranded   Protein / creatinine ratio, urine   Cyclic citrul peptide antibody, IgG   No orders of the defined types were placed in this encounter.    Follow-Up Instructions: No follow-ups on file.   Matt Song, MD  Note - This record has been created using [employer].  Chart creation errors have been sought, but may not always  have been located. Such creation errors do not reflect on  the standard of medical care.

## 2023-06-22 LAB — SEDIMENTATION RATE: Sed Rate: 11 mm/h (ref 0–20)

## 2023-06-22 LAB — PROTEIN / CREATININE RATIO, URINE
Creatinine, Urine: 68 mg/dL (ref 20–275)
Protein/Creat Ratio: 88 mg/g{creat} (ref 24–184)
Protein/Creatinine Ratio: 0.088 mg/mg{creat} (ref 0.024–0.184)
Total Protein, Urine: 6 mg/dL (ref 5–24)

## 2023-06-22 LAB — C3 AND C4
C3 Complement: 131 mg/dL (ref 83–193)
C4 Complement: 15 mg/dL (ref 15–57)

## 2023-06-22 LAB — CYCLIC CITRUL PEPTIDE ANTIBODY, IGG: Cyclic Citrullin Peptide Ab: <16 U

## 2023-06-22 LAB — ANTI-DNA ANTIBODY, DOUBLE-STRANDED: ds DNA Ab: 94 [IU]/mL — ABNORMAL HIGH

## 2023-06-22 LAB — C-REACTIVE PROTEIN: CRP: <3 mg/L (ref <8.0)

## 2023-06-25 ENCOUNTER — Ambulatory Visit (HOSPITAL_COMMUNITY)
Admission: RE | Admit: 2023-06-25 | Discharge: 2023-06-25 | Disposition: A | Discharge status: Home / Self Care | Source: Ambulatory Visit | Attending: Family | Admitting: Family

## 2023-06-25 DIAGNOSIS — E039 Hypothyroidism, unspecified: Secondary | ICD-10-CM | POA: Insufficient documentation

## 2023-06-26 ENCOUNTER — Ambulatory Visit: Payer: Self-pay | Admitting: Family

## 2023-06-27 ENCOUNTER — Ambulatory Visit (INDEPENDENT_AMBULATORY_CARE_PROVIDER_SITE_OTHER): Payer: Self-pay | Admitting: Vascular Surgery

## 2023-06-27 ENCOUNTER — Ambulatory Visit (INDEPENDENT_AMBULATORY_CARE_PROVIDER_SITE_OTHER): Payer: Self-pay

## 2023-06-27 ENCOUNTER — Encounter (INDEPENDENT_AMBULATORY_CARE_PROVIDER_SITE_OTHER): Payer: Self-pay | Admitting: Vascular Surgery

## 2023-06-27 VITALS — BP 117/82 | HR 78 | Resp 18 | Ht 64.0 in | Wt 196.0 lb

## 2023-06-27 DIAGNOSIS — J449 Chronic obstructive pulmonary disease, unspecified: Secondary | ICD-10-CM | POA: Diagnosis not present

## 2023-06-27 DIAGNOSIS — Z720 Tobacco use: Secondary | ICD-10-CM | POA: Diagnosis not present

## 2023-06-27 DIAGNOSIS — R2243 Localized swelling, mass and lump, lower limb, bilateral: Secondary | ICD-10-CM

## 2023-06-27 DIAGNOSIS — I739 Peripheral vascular disease, unspecified: Secondary | ICD-10-CM

## 2023-06-29 ENCOUNTER — Encounter (INDEPENDENT_AMBULATORY_CARE_PROVIDER_SITE_OTHER): Payer: Self-pay | Admitting: Vascular Surgery

## 2023-06-29 IMAGING — DX DG CHEST 1V PORT
1 series · 1 of 1 positions shown · non-contrast
Comparison: 02/18/2021

CLINICAL DATA: Shortness of breath

EXAM:
PORTABLE CHEST 1 VIEW

[chest ap]
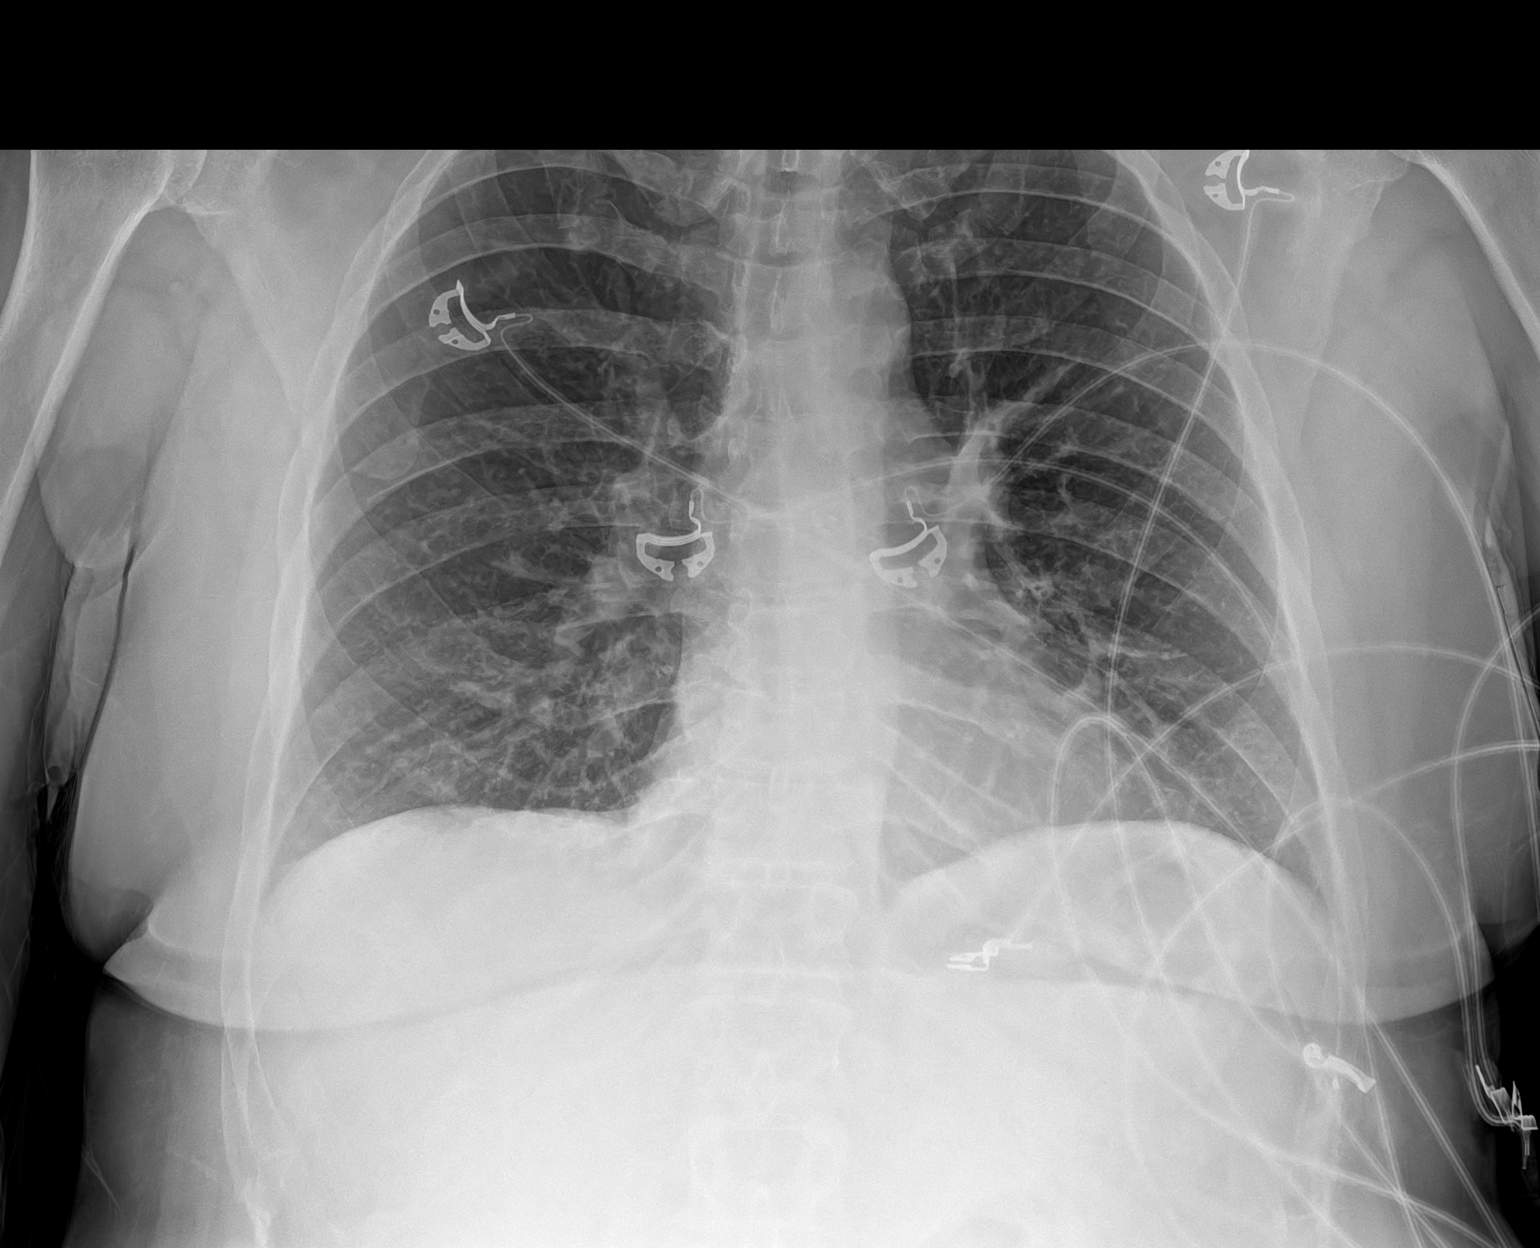

[1 of 1 positions shown; findings below may reference images not displayed]

FINDINGS: The heart size and mediastinal contours are within normal limits. No
focal airspace consolidation, pleural effusion, or pneumothorax. The
visualized skeletal structures are unremarkable.
IMPRESSION: No active disease.

## 2023-06-29 NOTE — Progress Notes (Signed)
 Subjective:    Patient ID: [name], female    DOB: [birth year], 48 y.o.   MRN: [id number] Chief Complaint  Patient presents with   New Patient (Initial Visit)    Ref Dugal consult intermittent claudication     [name] is a  48 yo female who presents to clinic with chief complaint of bilateral lower extremity intermittent claudication pain.  Patient underwent bilateral lower extremity arterial ultrasounds with ABIs on 06/27/2023.  These were essentially normal.  Right lower extremities ABI was 1.11 and her left lower extremity ABI was 1.10 with triphasic normal waveforms throughout.  Therefore she has excellent arterial flow to both of her lower extremities.  However she complains of bilateral lower leg swelling today.  She endorses she has had this swelling for a long but has not tried any conservative measures prior to today.  She endorses pain at rest as well as while ambulating.    Review of Systems  Constitutional: Negative.   Respiratory: Negative.    Cardiovascular: Negative.   Gastrointestinal: Negative.   Endocrine: Negative.   Genitourinary: Negative.   Skin:  Positive for color change.       Bilateral feet purple  Neurological: Negative.   Psychiatric/Behavioral: Negative.    All other systems reviewed and are negative.      Objective:    Physical Exam Vitals reviewed.  Constitutional:      Appearance: Normal appearance.  HENT:     Head: Normocephalic.  Eyes:     Pupils: Pupils are equal, round, and reactive to light.  Cardiovascular:     Rate and Rhythm: Normal rate and regular rhythm.  Pulmonary:     Effort: Pulmonary effort is normal.     Breath sounds: Normal breath sounds.  Abdominal:     General: Abdomen is flat. Bowel sounds are normal.     Palpations: Abdomen is soft.  Musculoskeletal:     Cervical back: Normal range of motion.     Right lower leg: Edema present.     Left lower leg: Edema present.  Skin:    General: Skin is  warm and dry.  Neurological:     General: No focal deficit present.     Mental Status: She is alert and oriented to person, place, and time. Mental status is at baseline.  Psychiatric:        Mood and Affect: Mood normal.        Behavior: Behavior normal.        Thought Content: Thought content normal.        Judgment: Judgment normal.     BP 117/82   Pulse 78   Resp 18   Ht 5\' 4"  (1.626 m)   Wt 196 lb (88.9 kg)   LMP 08/22/2022 (Approximate)   BMI 33.64 kg/m   Past Medical History:  Diagnosis Date   Alcohol  abuse    Anemia 01/31/2011   Asthma    COPD (chronic obstructive pulmonary disease) (HCC)    Depression    Hepatitis C 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 10/24/2021   RNA negative.  Hep C false positive.     Hypoxia 02/01/2011   Medical history non-contributory    Needs sleep apnea assessment 2013.07.31   Tobacco abuse    Vertigo 04/2023    Social History   Socioeconomic History   Marital status: Single    Spouse name: Not on file   Number of children: 1   Years of education: Not on file  Highest education level: Not on file  Occupational History    Employer: [employer]  Tobacco Use   Smoking status: Every Day    Current packs/day: 0.00    Average packs/day: 0.5 packs/day for 25.0 years (12.5 ttl pk-yrs)    Types: Cigarettes    Start date: 11/11/1996    Last attempt to quit: 11/11/2021    Years since quitting: 1.6    Passive exposure: Current   Smokeless tobacco: Never   Tobacco comments:     1 pack a day. Trying to quit started using chantix .  05/11/2023  Vaping Use   Vaping status: Former  Substance and Sexual Activity   Alcohol  use: Yes    Comment: occasionally   Drug use: Not Currently    Types: Marijuana, Cocaine    Comment: last >2 y/o   Sexual activity: Not Currently    Partners: Female    Comment: Homosexual  Other Topics Concern   Not on file  Social History Narrative   Son, 71 y/o    Social Drivers of Corporate investment banker  Strain: Not on file  Food Insecurity: No Food Insecurity (02/08/2022)   Hunger Vital Sign    Worried About Running Out of Food in the Last Year: Never true    Ran Out of Food in the Last Year: Never true  Transportation Needs: No Transportation Needs (02/08/2022)   PRAPARE - Administrator, Civil Service (Medical): No    Lack of Transportation (Non-Medical): No  Physical Activity: Not on file  Stress: Not on file  Social Connections: Not on file  Intimate Partner Violence: Not At Risk (02/08/2022)   Humiliation, Afraid, Rape, and Kick questionnaire    Fear of Current or Ex-Partner: No    Emotionally Abused: No    Physically Abused: No    Sexually Abused: No    Past Surgical History:  Procedure Laterality Date   ANKLE GANGLION CYST EXCISION     BACK SURGERY     CARPAL TUNNEL RELEASE Right    CESAREAN SECTION     COLONOSCOPY WITH PROPOFOL  N/A 06/27/2016   Procedure: COLONOSCOPY WITH PROPOFOL ;  Surgeon: Luke Salaam, MD;  Location: [facility] ENDOSCOPY;  Service: Endoscopy;  Laterality: N/A;   DILATION AND CURETTAGE OF UTERUS     ESOPHAGOGASTRODUODENOSCOPY (EGD) WITH PROPOFOL  N/A 06/27/2016   Procedure: ESOPHAGOGASTRODUODENOSCOPY (EGD) WITH PROPOFOL ;  Surgeon: Luke Salaam, MD;  Location: [facility] ENDOSCOPY;  Service: Endoscopy;  Laterality: N/A;    Family History  Problem Relation Age of Onset   Stroke Mother    Hypothyroidism Mother    Coronary artery disease Mother    Throat cancer Mother    Hypertension Brother    Stroke Maternal Grandmother    Diabetes Maternal Grandmother    COPD Paternal Grandmother     No Known Allergies     Latest Ref Rng & Units 05/07/2023   11:25 AM 04/30/2023    1:36 PM 01/05/2023   11:29 AM  CBC  WBC 4.0 - 10.5 K/uL 6.0  6.4  5.6   Hemoglobin 12.0 - 15.0 g/dL 09.8  11.9  14.7   Hematocrit 36.0 - 46.0 % 37.8  36.1  35.0   Platelets 150 - 400 K/uL 319  249.0  265.0        CMP     Component Value Date/Time   NA 135 05/07/2023 1125    NA 136 05/11/2013 0341   K 3.9 05/07/2023 1125   K 4.1 05/11/2013  0341   CL 101 05/07/2023 1125   CL 106 05/11/2013 0341   CO2 25 05/07/2023 1125   CO2 27 05/11/2013 0341   GLUCOSE 85 05/07/2023 1125   GLUCOSE 143 (H) 05/11/2013 0341   BUN 15 05/07/2023 1125   BUN 12 05/11/2013 0341   CREATININE 0.77 05/07/2023 1125   CREATININE 0.71 05/15/2013 0629   CALCIUM 9.6 05/07/2023 1125   CALCIUM 8.7 05/11/2013 0341   PROT 7.6 05/07/2023 1125   PROT 7.7 05/10/2013 0801   ALBUMIN 4.3 05/07/2023 1125   ALBUMIN 3.8 05/10/2013 0801   AST 21 05/07/2023 1125   AST 8 (L) 05/10/2013 0801   ALT 17 05/07/2023 1125   ALT 17 05/10/2013 0801   ALKPHOS 61 05/07/2023 1125   ALKPHOS 95 05/10/2013 0801   BILITOT 0.4 05/07/2023 1125   BILITOT 0.2 05/10/2013 0801   GFR 103.16 04/30/2023 1336   GFRNONAA >60 05/07/2023 1125   GFRNONAA >60 05/15/2013 0629     VAS US  ABI WITH/WO TBI Result Date: 06/27/2023  LOWER EXTREMITY DOPPLER STUDY Patient Name:  SHALAN TAHARA  Date of Exam:   06/27/2023 Medical Rec #: [id number]             Accession #:    [id number] Date of Birth: December 24, 1975              Patient Gender: F Patient Age:   89 years Exam Location:  [facility] Procedure:      VAS US  ABI WITH/WO TBI Referring Phys: --------------------------------------------------------------------------------  Indications: Rest pain.  Performing Technologist: Faustine Hoof RVT  Examination Guidelines: A complete evaluation includes at minimum, Doppler waveform signals and systolic blood pressure reading at the level of bilateral brachial, anterior tibial, and posterior tibial arteries, when vessel segments are accessible. Bilateral testing is considered an integral part of a complete examination. Photoelectric Plethysmograph (PPG) waveforms and toe systolic pressure readings are included as required and additional duplex testing as needed. Limited examinations for reoccurring indications may be performed as  noted.  ABI Findings: +---------+------------------+-----+---------+--------+ Right    Rt Pressure (mmHg)IndexWaveform Comment  +---------+------------------+-----+---------+--------+ Brachial 109                                      +---------+------------------+-----+---------+--------+ PTA      134               1.22 triphasic         +---------+------------------+-----+---------+--------+ DP       140               1.27 triphasic         +---------+------------------+-----+---------+--------+ Great Toe122               1.11 Normal            +---------+------------------+-----+---------+--------+ +---------+------------------+-----+---------+-------+ Left     Lt Pressure (mmHg)IndexWaveform Comment +---------+------------------+-----+---------+-------+ Brachial 110                                     +---------+------------------+-----+---------+-------+ PTA      144               1.31 triphasic        +---------+------------------+-----+---------+-------+ DP       145  1.32 triphasic        +---------+------------------+-----+---------+-------+ Great Toe121               1.10 Normal           +---------+------------------+-----+---------+-------+  Summary: Right: Resting right ankle-brachial index is within normal range. The right toe-brachial index is normal. Left: Resting left ankle-brachial index is within normal range. The left toe-brachial index is normal. *See table(s) above for measurements and observations.  Electronically signed by Mikki Alexander MD on 06/27/2023 at 4:17:15 PM.    Final        Assessment & Plan:    1. Localized swelling of both lower legs (Primary) Patient's arterial duplex ultrasounds with ABIs are normal today.  Left ABI is 1.1 and right ABI is 1.10 with triphasic normal waveforms throughout.  Recommend:  I have had a long discussion with the patient regarding swelling and why it  causes symptoms.  Patient  will begin wearing graduated compression on a daily basis a prescription was given. The patient will  wear the stockings first thing in the morning and removing them in the evening. The patient is instructed specifically not to sleep in the stockings.   In addition, behavioral modification will be initiated.  This will include frequent elevation, use of over the counter pain medications and exercise such as walking.  Consideration for a lymph pump will also be made based upon the effectiveness of conservative therapy.  This would help to improve the edema control and prevent sequela such as ulcers and infections   Patient should undergo duplex ultrasound of the venous system to ensure that DVT or reflux is not present.  The patient will follow-up with me after the ultrasound.   2. Tobacco abuse Smoking cessation was discussed, 3-10 minutes spent on this topic specifically  3. 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, still smoking Continue pulmonary medications and aerosols as already ordered, these medications have been reviewed and there are no changes at this time.     Current Outpatient Medications on File Prior to Visit  Medication Sig Dispense Refill   albuterol  (VENTOLIN  HFA) 108 (90 Base) MCG/ACT inhaler Inhale 2 puffs into the lungs every 4 (four) hours as needed. 18 g 2   ALPRAZolam  (XANAX ) 0.25 MG tablet Take 1 tablet (0.25 mg total) by mouth 2 (two) times daily as needed for anxiety or sleep. 30 tablet 0   azelastine  (ASTELIN ) 0.1 % nasal spray Place 1 spray into both nostrils 2 (two) times daily. Use in each nostril as directed 30 mL 1   budesonide  (PULMICORT ) 0.5 MG/2ML nebulizer solution Take 2 mLs (0.5 mg total) by nebulization 2 (two) times daily as needed (Wheezing). 60 mL 0   budesonide -formoterol  (SYMBICORT ) 160-4.5 MCG/ACT inhaler Inhale 2 puffs into the lungs 2 (two) times daily.     cyclobenzaprine  (FLEXERIL ) 10 MG tablet Take 1 tablet (10 mg total) by mouth 3 (three)  times daily as needed for muscle spasms. 30 tablet 0   FLUoxetine  (PROZAC ) 20 MG tablet Take 1 tablet (20 mg total) by mouth daily. 90 tablet 3   gabapentin  (NEURONTIN ) 300 MG capsule TAKE 1 CAPSULE BY MOUTH ONCE DAILY AS NEEDED FOR PAIN 90 capsule 0   meclizine  (ANTIVERT ) 25 MG tablet TAKE 1/2 TO 1 (ONE-HALF TO ONE) TABLET BY MOUTH ONCE DAILY AS NEEDED FOR VERTIGO 30 tablet 0   meloxicam  (MOBIC ) 7.5 MG tablet Take 1 tablet (7.5 mg total) by mouth 2 (two) times daily. 180 tablet  0   Nebulizer System All-In-One MISC 1 Device by Does not apply route daily as needed. 1 each 0   oxyCODONE -acetaminophen  (PERCOCET) 10-325 MG tablet Take 1 tablet by mouth 4 (four) times daily as needed.     SYNTHROID  200 MCG tablet Take 1 tablet (200 mcg total) by mouth daily before breakfast. 30 tablet 1   Tiotropium Bromide Monohydrate  (SPIRIVA  RESPIMAT) 2.5 MCG/ACT AERS Inhale 2 puffs into the lungs daily. 1 each 5   varenicline  (CHANTIX  CONTINUING MONTH PAK) 1 MG tablet Take 1 tablet (1 mg total) by mouth 2 (two) times daily. 60 tablet 1   Varenicline  Tartrate, Starter, (CHANTIX  STARTING MONTH PAK) 0.5 MG X 11 & 1 MG X 42 TBPK Take one 0.5 mg tablet by mouth once daily for 3 days, then increase to one 0.5 mg tablet twice daily for 4 days, then increase to one 1 mg tablet twice daily. 1 each 0   albuterol  (VENTOLIN  HFA) 108 (90 Base) MCG/ACT inhaler INHALE 1 TO 2 PUFFS BY MOUTH EVERY 6 HOURS AS NEEDED FOR WHEEZING OR SHORTNESS OF BREATH . (Patient not taking: Reported on 06/27/2023) 9 g 0   guaiFENesin  (MUCINEX ) 600 MG 12 hr tablet Take 2 tablets (1,200 mg total) by mouth 2 (two) times daily as needed for cough or to loosen phlegm. (Patient not taking: Reported on 06/27/2023) 30 tablet 1   oxyCODONE -acetaminophen  (PERCOCET/ROXICET) 5-325 MG tablet Take 1 tablet by mouth every 6 (six) hours as needed for severe pain (pain score 7-10). (Patient not taking: Reported on 06/27/2023) 20 tablet 0   predniSONE  (DELTASONE ) 10 MG  tablet 30 mg for 3 days, 20 mg for 3 days, 10 mg for 3 days (Patient not taking: Reported on 06/27/2023) 18 tablet 0   Current Facility-Administered Medications on File Prior to Visit  Medication Dose Route Frequency Provider Last Rate Last Admin   ipratropium-albuterol  (DUONEB) 0.5-2.5 (3) MG/3ML nebulizer solution 3 mL  3 mL Nebulization Q6H Antonio Baumgarten, NP   3 mL at 11/17/21 1357    There are no Patient Instructions on file for this visit. No follow-ups on file.   Annamaria Barrette, NP

## 2023-07-01 ENCOUNTER — Ambulatory Visit: Payer: Self-pay | Admitting: Family

## 2023-07-01 DIAGNOSIS — E039 Hypothyroidism, unspecified: Secondary | ICD-10-CM

## 2023-07-03 ENCOUNTER — Encounter (INDEPENDENT_AMBULATORY_CARE_PROVIDER_SITE_OTHER): Payer: Self-pay

## 2023-07-04 NOTE — Telephone Encounter (Signed)
 Ilndsay   Do you see any openings with pulmonary?

## 2023-07-06 ENCOUNTER — Ambulatory Visit (INDEPENDENT_AMBULATORY_CARE_PROVIDER_SITE_OTHER): Admitting: Family

## 2023-07-06 ENCOUNTER — Telehealth: Payer: Self-pay | Admitting: Primary Care

## 2023-07-06 ENCOUNTER — Encounter: Payer: Self-pay | Admitting: Family

## 2023-07-06 ENCOUNTER — Telehealth: Payer: Self-pay

## 2023-07-06 ENCOUNTER — Ambulatory Visit (INDEPENDENT_AMBULATORY_CARE_PROVIDER_SITE_OTHER)
Admission: RE | Admit: 2023-07-06 | Discharge: 2023-07-06 | Disposition: A | Discharge status: Home / Self Care | Source: Ambulatory Visit | Attending: Family | Admitting: Family

## 2023-07-06 VITALS — BP 114/82 | HR 94 | Temp 98.2°F | Ht 64.0 in | Wt 198.0 lb

## 2023-07-06 DIAGNOSIS — G4733 Obstructive sleep apnea (adult) (pediatric): Secondary | ICD-10-CM

## 2023-07-06 DIAGNOSIS — I517 Cardiomegaly: Secondary | ICD-10-CM | POA: Insufficient documentation

## 2023-07-06 DIAGNOSIS — J449 Chronic obstructive pulmonary disease, unspecified: Secondary | ICD-10-CM

## 2023-07-06 DIAGNOSIS — R739 Hyperglycemia, unspecified: Secondary | ICD-10-CM | POA: Diagnosis not present

## 2023-07-06 DIAGNOSIS — J441 Chronic obstructive pulmonary disease with (acute) exacerbation: Secondary | ICD-10-CM

## 2023-07-06 DIAGNOSIS — E063 Autoimmune thyroiditis: Secondary | ICD-10-CM | POA: Diagnosis not present

## 2023-07-06 DIAGNOSIS — R0609 Other forms of dyspnea: Secondary | ICD-10-CM

## 2023-07-06 DIAGNOSIS — J9621 Acute and chronic respiratory failure with hypoxia: Secondary | ICD-10-CM

## 2023-07-06 DIAGNOSIS — R0602 Shortness of breath: Secondary | ICD-10-CM

## 2023-07-06 LAB — BASIC METABOLIC PANEL WITH GFR
BUN: 15 mg/dL (ref 6–23)
CO2: 29 meq/L (ref 19–32)
Calcium: 9.4 mg/dL (ref 8.4–10.5)
Chloride: 104 meq/L (ref 96–112)
Creatinine, Ser: 0.68 mg/dL (ref 0.40–1.20)
GFR: 102.66 mL/min (ref >60.00)
Glucose, Bld: 88 mg/dL (ref 70–99)
Potassium: 4.3 meq/L (ref 3.5–5.1)
Sodium: 140 meq/L (ref 135–145)

## 2023-07-06 LAB — D-DIMER, QUANTITATIVE: D-Dimer, Quant: <0.19 ug{FEU}/mL (ref <0.50)

## 2023-07-06 LAB — CBC WITH DIFFERENTIAL/PLATELET
Basophils Absolute: 0 10*3/uL (ref 0.0–0.1)
Basophils Relative: 0.7 % (ref 0.0–3.0)
Eosinophils Absolute: 0.4 10*3/uL (ref 0.0–0.7)
Eosinophils Relative: 6 % — ABNORMAL HIGH (ref 0.0–5.0)
HCT: 37 % (ref 36.0–46.0)
Hemoglobin: 11.8 g/dL — ABNORMAL LOW (ref 12.0–15.0)
Lymphocytes Relative: 37.5 % (ref 12.0–46.0)
Lymphs Abs: 2.3 10*3/uL (ref 0.7–4.0)
MCHC: 32.1 g/dL (ref 30.0–36.0)
MCV: 82.6 fl (ref 78.0–100.0)
Monocytes Absolute: 0.5 10*3/uL (ref 0.1–1.0)
Monocytes Relative: 7.8 % (ref 3.0–12.0)
Neutro Abs: 3 10*3/uL (ref 1.4–7.7)
Neutrophils Relative %: 48 % (ref 43.0–77.0)
Platelets: 270 10*3/uL (ref 150.0–400.0)
RBC: 4.48 Mil/uL (ref 3.87–5.11)
RDW: 17.4 % — ABNORMAL HIGH (ref 11.5–15.5)
WBC: 6.2 10*3/uL (ref 4.0–10.5)

## 2023-07-06 LAB — TSH: TSH: 11.94 u[IU]/mL — ABNORMAL HIGH (ref 0.35–5.50)

## 2023-07-06 LAB — BRAIN NATRIURETIC PEPTIDE: Pro B Natriuretic peptide (BNP): 20 pg/mL (ref 0.0–100.0)

## 2023-07-06 MED ORDER — PREDNISONE 10 MG (21) PO TBPK: ORAL_TABLET | ORAL | 0 refills | Status: DC

## 2023-07-06 NOTE — Patient Instructions (Signed)
  A referral was placed today for endocrinology. Please let us  know if you have not heard back within 2 weeks about the referral.

## 2023-07-06 NOTE — Telephone Encounter (Signed)
 Orders placed for [name]. Pt was called and informed. Pt verbalized understanding. NFN

## 2023-07-06 NOTE — Telephone Encounter (Signed)
 I called and spoke to [name]. [name] states she is okay with starting CPAP and requests that it goes to [name]. [name] also stated that she needs an order placed for oxygen  tubing/ supplies sent to [name].   I will route to Irby Mannan, NP to advise on the CPAP settings and to okay for an order to also be placed for the Oxygen  tubing.

## 2023-07-06 NOTE — Assessment & Plan Note (Signed)
 Reviewed echo normal limits CTA and CT chest reviewed.  Symptoms do not fit the imaging picture. Ordering pfts, d dimer, bnp and cbc  Dg stat cxr to r/o pneumonia or bacterial process Albuterol /nebulizer prn. Rx prednisone  pack take as directed Will await cxr results for antbx consideration   ER precautions advised

## 2023-07-06 NOTE — Assessment & Plan Note (Signed)
 Uncontrolled despite treatment  Synthroid  200 mcg once daily, checking tsh today since brand change

## 2023-07-06 NOTE — Telephone Encounter (Signed)
 Auto CPAP 5-15cm h20 Ok to place order for oxygen  tubing

## 2023-07-06 NOTE — Telephone Encounter (Signed)
 Received completed updated forms for patient. Copy sent to scan. Left message to return call to office. Need to follow up if patient wants us  to mail forms.

## 2023-07-06 NOTE — Progress Notes (Signed)
 Established Patient Office Visit  Subjective:      CC:  Chief Complaint  Patient presents with   Medical Management of Chronic Issues    HPI: [name] is a 48 y.o. female presenting on 07/06/2023 for Medical Management of Chronic Issues . COPD, still following with pulmonary f/u is in July.  Chantix  restarted last visit in April she states this is helping down to half a pack.  She is on spiriva  and symbicort .   Rheumatology consult May 8th, labs were ordered, pending.   DOE, echocardiogram may 5, normal findings 55-60%   Pedal edema, established 5/24 with vascular surgery. ABI normal limits. Instructed to wear compression stockings. Duplex u/s ordered at visit, not yet performed.   Paranasal sinus disease,   On STD started 05/08/23 anticipated return date 07/10/23  Hashimotos TSH elevated in March, repeat done in April even higher at 18.38 so we changed to brand synthroid  4/28. u/s thyroid  hetergenous thyroid  cland, incidental nodule 6 mm left thyroid .   Recent sleep study showed mild OSA 11 apneic episodes. She has f/u appt with pulmonary to discuss June 6th.   For the last one week with exacerbation of copd. She is taking her breathing treatments every six hours without improvement. She states albuterol  she is having to use 'too much' she has worsening increased DOE. She states doesn't feel sick but does feel weak. No fever. CT chest 05/10/23 enlarged main pulmonary artery with interval decrease in size 3.5 cm. Interval borderline enlarged heart. Recent episode of pneumonia which was resolving on CT chest 05/10/23. Denies pedal edema. Early am and late night is when she has worsening DOE, she reclines herself can not lie flat otherwise she can not breathe. No ear pain or sore throat. She uses astelin  spray.         Social history:  Relevant past medical, surgical, family and social history reviewed and updated as indicated. Interim medical history since our  last visit reviewed.  Allergies and medications reviewed and updated.  DATA REVIEWED: CHART IN EPIC     ROS: Negative unless specifically indicated above in HPI.    Current Outpatient Medications:    albuterol  (VENTOLIN  HFA) 108 (90 Base) MCG/ACT inhaler, Inhale 2 puffs into the lungs every 4 (four) hours as needed., Disp: 18 g, Rfl: 2   ALPRAZolam  (XANAX ) 0.25 MG tablet, Take 1 tablet (0.25 mg total) by mouth 2 (two) times daily as needed for anxiety or sleep., Disp: 30 tablet, Rfl: 0   azelastine  (ASTELIN ) 0.1 % nasal spray, Place 1 spray into both nostrils 2 (two) times daily. Use in each nostril as directed, Disp: 30 mL, Rfl: 1   budesonide  (PULMICORT ) 0.5 MG/2ML nebulizer solution, Take 2 mLs (0.5 mg total) by nebulization 2 (two) times daily as needed (Wheezing)., Disp: 60 mL, Rfl: 0   budesonide -formoterol  (SYMBICORT ) 160-4.5 MCG/ACT inhaler, Inhale 2 puffs into the lungs 2 (two) times daily., Disp: , Rfl:    cyclobenzaprine  (FLEXERIL ) 10 MG tablet, Take 1 tablet (10 mg total) by mouth 3 (three) times daily as needed for muscle spasms., Disp: 30 tablet, Rfl: 0   FLUoxetine  (PROZAC ) 20 MG tablet, Take 1 tablet (20 mg total) by mouth daily., Disp: 90 tablet, Rfl: 3   gabapentin  (NEURONTIN ) 300 MG capsule, TAKE 1 CAPSULE BY MOUTH ONCE DAILY AS NEEDED FOR PAIN, Disp: 90 capsule, Rfl: 0   meclizine  (ANTIVERT ) 25 MG tablet, TAKE 1/2 TO 1 (ONE-HALF TO ONE) TABLET BY MOUTH ONCE DAILY AS  NEEDED FOR VERTIGO, Disp: 30 tablet, Rfl: 0   meloxicam  (MOBIC ) 7.5 MG tablet, Take 1 tablet (7.5 mg total) by mouth 2 (two) times daily., Disp: 180 tablet, Rfl: 0   Nebulizer System All-In-One MISC, 1 Device by Does not apply route daily as needed., Disp: 1 each, Rfl: 0   oxyCODONE -acetaminophen  (PERCOCET) 10-325 MG tablet, Take 1 tablet by mouth 4 (four) times daily as needed., Disp: , Rfl:    predniSONE  (STERAPRED UNI-PAK 21 TAB) 10 MG (21) TBPK tablet, Take as directed, Disp: 1 each, Rfl: 0   SYNTHROID   200 MCG tablet, Take 1 tablet (200 mcg total) by mouth daily before breakfast., Disp: 30 tablet, Rfl: 1   Tiotropium Bromide Monohydrate  (SPIRIVA  RESPIMAT) 2.5 MCG/ACT AERS, Inhale 2 puffs into the lungs daily., Disp: 1 each, Rfl: 5   varenicline  (CHANTIX  CONTINUING MONTH PAK) 1 MG tablet, Take 1 tablet (1 mg total) by mouth 2 (two) times daily., Disp: 60 tablet, Rfl: 1  Current Facility-Administered Medications:    ipratropium-albuterol  (DUONEB) 0.5-2.5 (3) MG/3ML nebulizer solution 3 mL, 3 mL, Nebulization, Q6H, Antonio Baumgarten, NP, 3 mL at 11/17/21 1357      Objective:     BP 114/82 (BP Location: Left Arm, Patient Position: Sitting, Cuff Size: Normal)   Pulse 94   Temp 98.2 F (36.8 C) (Temporal)   Ht 5\' 4"  (1.626 m)   Wt 198 lb (89.8 kg)   LMP 08/22/2022 (Approximate)   SpO2 94%   BMI 33.99 kg/m   Wt Readings from Last 3 Encounters:  07/06/23 198 lb (89.8 kg)  06/27/23 196 lb (88.9 kg)  06/21/23 195 lb 12.8 oz (88.8 kg)    Physical Exam Constitutional:      General: She is not in acute distress.    Appearance: Normal appearance. She is normal weight. She is not ill-appearing, toxic-appearing or diaphoretic.  HENT:     Head: Normocephalic.     Right Ear: Tympanic membrane normal.     Left Ear: Tympanic membrane normal.     Nose: Nose normal.     Mouth/Throat:     Mouth: Mucous membranes are dry.     Pharynx: No oropharyngeal exudate or posterior oropharyngeal erythema.  Eyes:     Extraocular Movements: Extraocular movements intact.     Pupils: Pupils are equal, round, and reactive to light.  Cardiovascular:     Rate and Rhythm: Normal rate and regular rhythm.     Pulses: Normal pulses.     Heart sounds: Normal heart sounds.  Pulmonary:     Effort: Pulmonary effort is normal.     Breath sounds: Decreased air movement present. Examination of the right-upper field reveals wheezing. Examination of the left-upper field reveals wheezing. Examination of the  right-middle field reveals wheezing. Examination of the left-middle field reveals wheezing. Examination of the right-lower field reveals wheezing. Examination of the left-lower field reveals wheezing. Decreased breath sounds and wheezing present.  Musculoskeletal:     Cervical back: Normal range of motion.  Neurological:     General: No focal deficit present.     Mental Status: She is alert and oriented to person, place, and time. Mental status is at baseline.  Psychiatric:        Mood and Affect: Mood normal.        Behavior: Behavior normal.        Thought Content: Thought content normal.        Judgment: Judgment normal.  Assessment & Plan:  COPD exacerbation (HCC) -     predniSONE ; Take as directed  Dispense: 1 each; Refill: 0 -     CBC with Differential/Platelet -     DG Chest 2 View; Future -     Pulmonary Function Test; Future  DOE (dyspnea on exertion) Assessment & Plan: Reviewed echo normal limits CTA and CT chest reviewed.  Symptoms do not fit the imaging picture. Ordering pfts, d dimer, bnp and cbc  Dg stat cxr to r/o pneumonia or bacterial process Albuterol /nebulizer prn. Rx prednisone  pack take as directed Will await cxr results for antbx consideration   ER precautions advised  Orders: -     Brain natriuretic peptide -     D-dimer, quantitative -     CBC with Differential/Platelet -     DG Chest 2 View; Future  Hypothyroidism due to Hashimoto thyroiditis Assessment & Plan: Uncontrolled despite treatment  Synthroid  200 mcg once daily, checking tsh today since brand change  Orders: -     Ambulatory referral to Endocrinology -     TSH  Hyperglycemia -     Basic metabolic panel with GFR  Cardiomegaly   On STD started 05/08/23, pt is not medically stable.  Will extend to 08/08/23    Return in about 1 month (around 08/06/2023) for follow up short term disability.  Felicita Horns, MSN, APRN, FNP-C [facility]

## 2023-07-06 NOTE — Telephone Encounter (Signed)
 Copied from CRM [id number]. Topic: General - Call Back - No Documentation >> Jul 06, 2023 12:30 PM [name] wrote: Reason for CRM: Patient called in stating that had a missed call from office.Theres no documentation as to call her.She think it may have been CMA Loris Ros.

## 2023-07-06 NOTE — Telephone Encounter (Signed)
 Please let patient know I want to start her on CPAP for her mild OSA. She needs to aim to wear nightly while sleeping

## 2023-07-07 ENCOUNTER — Ambulatory Visit: Payer: Self-pay | Admitting: Family

## 2023-07-10 NOTE — Telephone Encounter (Signed)
 Left second message to return call to office. Need to follow up regarding FMLA/disability forms.

## 2023-07-11 NOTE — Telephone Encounter (Signed)
 Left third voicemail to return call to office. Need to follow up regarding FMLA/disability forms. Forms were mailed last time. I will mail forms at the end of the day if I don't hear from the patient today.

## 2023-07-16 NOTE — Telephone Encounter (Signed)
 Pt sent this to me but I do believe your office ordered the CPAP. Thanks!

## 2023-07-17 ENCOUNTER — Encounter: Payer: Self-pay | Admitting: Primary Care

## 2023-07-17 ENCOUNTER — Ambulatory Visit: Admitting: Primary Care

## 2023-07-17 VITALS — BP 117/80 | HR 67 | Temp 97.8°F | Ht 64.0 in | Wt 197.8 lb

## 2023-07-17 DIAGNOSIS — G4733 Obstructive sleep apnea (adult) (pediatric): Secondary | ICD-10-CM | POA: Diagnosis not present

## 2023-07-17 DIAGNOSIS — F17210 Nicotine dependence, cigarettes, uncomplicated: Secondary | ICD-10-CM | POA: Diagnosis not present

## 2023-07-17 DIAGNOSIS — J449 Chronic obstructive pulmonary disease, unspecified: Secondary | ICD-10-CM | POA: Diagnosis not present

## 2023-07-17 MED ORDER — TRELEGY ELLIPTA 200-62.5-25 MCG/ACT IN AEPB
1.0000 | INHALATION_SPRAY | Freq: Every day | RESPIRATORY_TRACT | Status: AC
Start: 2023-07-17 — End: ?

## 2023-07-17 MED ORDER — PREDNISONE 10 MG PO TABS: ORAL_TABLET | ORAL | 0 refills | Status: DC

## 2023-07-17 MED ORDER — ROFLUMILAST 250 MCG PO TABS: 1.0000 | ORAL_TABLET | Freq: Every day | ORAL | 0 refills | Status: DC

## 2023-07-17 NOTE — Progress Notes (Signed)
 @Patient  ID: [name], female    DOB: Aug 03, 1975, 48 y.o.   MRN: [id number]  Chief Complaint  Patient presents with   Follow-up    F/u on COPD and Hypoxia. Uses 3L 02 HS.     Referring provider: Felicita Horns, FNP  HPI: 48 year old female, current everyday smoker (25 pack year hx). PMH significant for COPD GOLD II, CAP, acute respiratory failure, hypothyroidism, alcohol  dependence, major depression, anemia, tobacco abuse. Patient of Dr. Waymond Hailey.  07/17/2023 Discussed the use of AI scribe software for clinical note transcription with the patient, who gave verbal consent to proceed.  History of Present Illness   [name] is a 48 year old female with moderate to severe COPD who presents with persistent shortness of breath and fatigue.  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 Her energy levels deplete quickly, often within minutes of starting an activity. Symptoms are manageable when sitting still but worsen with exertion. These issues persist despite previous evaluations and treatments.  She has moderate to severe COPD, with her last pulmonary function test in 2022 showing lung function at 54%, indicating stage 2 to 3 COPD. She continues to smoke and has started using Chantix  to aid in smoking cessation. Her current medications include Symbicort  and Spiriva  inhalers, but her insurance does not cover Breztri . She has not tried Trelegy before. She uses a rescue inhaler, albuterol , and a nebulizer with budesonide  or Pulmicort  up to twice a day as needed for wheezing.  CT chest in March showed borderline cardiomegaly.  Interval decrease in size of enlarged main pulmonary artery.  Interval improvement in bilateral atelectasis/scarring.  Resolved pneumonia right lower lobe. A recent chest x-ray in May showed no acute cardiopulmonary disease. An echocardiogram in May showed an  ejection fraction of 55 to 60%.  Home sleep study on 05/21/2023 showed mild obstructive sleep apnea, AHI 4% 11.8/hour.  SpO2 low 83%.  Patient spent 48 minutes with an oxygen  level less than 88%.  Recommend patient be started on auto CPAP 5 to 15 cm H2O.   She has been on prednisone  previously, which significantly improves her symptoms, although the effects are temporary. She has not been on antibiotics since March.  She is concerned about her ability to return to work due to her current health status and is worried about losing her insurance coverage. She is currently on short-term disability.      No Known Allergies  Immunization History  Administered Date(s) Administered   Influenza Inj Mdck Quad With Preservative 09/26/2021   Influenza,inj,Quad PF,6+ Mos 11/12/2014, 02/13/2016, 12/07/2016, 02/19/2020   Janssen (J&J) SARS-COV-2 Vaccination 07/21/2019    Past Medical History:  Diagnosis Date   Alcohol  abuse    Anemia 01/31/2011   Asthma    COPD (chronic obstructive pulmonary disease) (HCC)    Depression    Hepatitis C 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 10/24/2021   RNA negative.  Hep C false positive.     Hypoxia 02/01/2011   Medical history non-contributory    Needs sleep apnea assessment 2013.07.31   Tobacco abuse    Vertigo 04/2023    Tobacco History: Social History   Tobacco Use  Smoking Status Every Day   Current packs/day: 0.00   Average packs/day: 0.5 packs/day for 25.0 years (12.5 ttl pk-yrs)   Types: Cigarettes   Start date: 11/11/1996   Last attempt to quit: 11/11/2021   Years since quitting: 1.6   Passive  exposure: Current  Smokeless Tobacco Never  Tobacco Comments    1 pack a day. Trying to quit started using chantix .  05/11/2023   Ready to quit: Not Answered Counseling given: Not Answered Tobacco comments:  1 pack a day. Trying to quit started using chantix .  05/11/2023   Outpatient Medications Prior to Visit  Medication Sig Dispense Refill   albuterol  (VENTOLIN   HFA) 108 (90 Base) MCG/ACT inhaler Inhale 2 puffs into the lungs every 4 (four) hours as needed. 18 g 2   ALPRAZolam  (XANAX ) 0.25 MG tablet Take 1 tablet (0.25 mg total) by mouth 2 (two) times daily as needed for anxiety or sleep. 30 tablet 0   azelastine  (ASTELIN ) 0.1 % nasal spray Place 1 spray into both nostrils 2 (two) times daily. Use in each nostril as directed 30 mL 1   budesonide  (PULMICORT ) 0.5 MG/2ML nebulizer solution Take 2 mLs (0.5 mg total) by nebulization 2 (two) times daily as needed (Wheezing). 60 mL 0   budesonide -formoterol  (SYMBICORT ) 160-4.5 MCG/ACT inhaler Inhale 2 puffs into the lungs 2 (two) times daily.     cyclobenzaprine  (FLEXERIL ) 10 MG tablet Take 1 tablet (10 mg total) by mouth 3 (three) times daily as needed for muscle spasms. 30 tablet 0   FLUoxetine  (PROZAC ) 20 MG tablet Take 1 tablet (20 mg total) by mouth daily. 90 tablet 3   gabapentin  (NEURONTIN ) 300 MG capsule TAKE 1 CAPSULE BY MOUTH ONCE DAILY AS NEEDED FOR PAIN 90 capsule 0   meclizine  (ANTIVERT ) 25 MG tablet TAKE 1/2 TO 1 (ONE-HALF TO ONE) TABLET BY MOUTH ONCE DAILY AS NEEDED FOR VERTIGO 30 tablet 0   meloxicam  (MOBIC ) 7.5 MG tablet Take 1 tablet (7.5 mg total) by mouth 2 (two) times daily. 180 tablet 0   Nebulizer System All-In-One MISC 1 Device by Does not apply route daily as needed. 1 each 0   oxyCODONE -acetaminophen  (PERCOCET) 10-325 MG tablet Take 1 tablet by mouth 4 (four) times daily as needed.     SYNTHROID  200 MCG tablet Take 1 tablet (200 mcg total) by mouth daily before breakfast. 30 tablet 1   Tiotropium Bromide Monohydrate  (SPIRIVA  RESPIMAT) 2.5 MCG/ACT AERS Inhale 2 puffs into the lungs daily. 1 each 5   varenicline  (CHANTIX ) 0.5 MG tablet Take 0.5 mg by mouth 2 (two) times daily.     predniSONE  (STERAPRED UNI-PAK 21 TAB) 10 MG (21) TBPK tablet Take as directed 1 each 0   Facility-Administered Medications Prior to Visit  Medication Dose Route Frequency Provider Last Rate Last Admin    ipratropium-albuterol  (DUONEB) 0.5-2.5 (3) MG/3ML nebulizer solution 3 mL  3 mL Nebulization Q6H Antonio Baumgarten, NP   3 mL at 11/17/21 1357   Review of Systems  Review of Systems  Constitutional: Negative.   HENT:  Positive for congestion.   Respiratory:  Positive for cough, shortness of breath and wheezing.   Cardiovascular: Negative.    Physical Exam  BP 117/80 (BP Location: Right Arm, Patient Position: Sitting, Cuff Size: Normal)   Pulse 67   Temp 97.8 F (36.6 C) (Temporal)   Ht 5\' 4"  (1.626 m)   Wt 197 lb 12.8 oz (89.7 kg)   LMP 08/22/2022 (Approximate)   SpO2 96%   BMI 33.95 kg/m  Physical Exam Constitutional:      General: She is not in acute distress.    Appearance: Normal appearance. She is not ill-appearing.  HENT:     Head: Normocephalic and atraumatic.     Mouth/Throat:  Mouth: Mucous membranes are moist.     Pharynx: Oropharynx is clear.  Cardiovascular:     Rate and Rhythm: Normal rate and regular rhythm.  Pulmonary:     Effort: Pulmonary effort is normal.     Breath sounds: Wheezing present.  Musculoskeletal:        General: Normal range of motion.  Skin:    General: Skin is warm and dry.  Neurological:     General: No focal deficit present.     Mental Status: She is alert and oriented to person, place, and time. Mental status is at baseline.  Psychiatric:        Mood and Affect: Mood normal.        Behavior: Behavior normal.        Thought Content: Thought content normal.        Judgment: Judgment normal.      Lab Results:  CBC    Component Value Date/Time   WBC 6.2 07/06/2023 0917   RBC 4.48 07/06/2023 0917   HGB 11.8 (L) 07/06/2023 0917   HGB 11.6 (L) 05/13/2013 0449   HCT 37.0 07/06/2023 0917   HCT 36.2 05/13/2013 0449   PLT 270.0 07/06/2023 0917   PLT 264 05/13/2013 0449   MCV 82.6 07/06/2023 0917   MCV 82 05/13/2013 0449   MCH 26.1 05/07/2023 1125   MCHC 32.1 07/06/2023 0917   RDW 17.4 (H) 07/06/2023 0917   RDW 15.3 (H)  05/13/2013 0449   LYMPHSABS 2.3 07/06/2023 0917   LYMPHSABS 2.4 05/13/2013 0449   MONOABS 0.5 07/06/2023 0917   MONOABS 0.9 05/13/2013 0449   EOSABS 0.4 07/06/2023 0917   EOSABS 0.0 05/13/2013 0449   BASOSABS 0.0 07/06/2023 0917   BASOSABS 0.0 05/13/2013 0449    BMET    Component Value Date/Time   NA 140 07/06/2023 0917   NA 136 05/11/2013 0341   K 4.3 07/06/2023 0917   K 4.1 05/11/2013 0341   CL 104 07/06/2023 0917   CL 106 05/11/2013 0341   CO2 29 07/06/2023 0917   CO2 27 05/11/2013 0341   GLUCOSE 88 07/06/2023 0917   GLUCOSE 143 (H) 05/11/2013 0341   BUN 15 07/06/2023 0917   BUN 12 05/11/2013 0341   CREATININE 0.68 07/06/2023 0917   CREATININE 0.71 05/15/2013 0629   CALCIUM 9.4 07/06/2023 0917   CALCIUM 8.7 05/11/2013 0341   GFRNONAA >60 05/07/2023 1125   GFRNONAA >60 05/15/2013 0629   GFRAA >60 10/25/2019 0739   GFRAA >60 05/15/2013 0629    BNP    Component Value Date/Time   BNP 9.0 04/10/2022 0903    ProBNP    Component Value Date/Time   PROBNP 20.0 07/06/2023 0917    Imaging: DG Chest 2 View Result Date: 07/06/2023 CLINICAL DATA:  wheezing worsening doe EXAM: CHEST - 2 VIEW COMPARISON:  None available. FINDINGS: No focal airspace consolidation, pleural effusion, or pneumothorax. No cardiomegaly. No acute fracture or destructive lesion. IMPRESSION: No acute cardiopulmonary abnormality. Electronically Signed   By: Rance Burrows [name]   On: 07/06/2023 11:25   VAS US  ABI WITH/WO TBI Result Date: 06/27/2023  LOWER EXTREMITY DOPPLER STUDY Patient Name:  [name]  Date of Exam:   06/27/2023 Medical Rec #: [id number]             Accession #:    [id number] Date of Birth: 1975-08-08              Patient Gender: F Patient Age:  47 years Exam Location:  [facility] Procedure:      VAS US  ABI WITH/WO TBI Referring Phys: --------------------------------------------------------------------------------  Indications: Rest pain.  Performing  Technologist: Faustine Hoof RVT  Examination Guidelines: A complete evaluation includes at minimum, Doppler waveform signals and systolic blood pressure reading at the level of bilateral brachial, anterior tibial, and posterior tibial arteries, when vessel segments are accessible. Bilateral testing is considered an integral part of a complete examination. Photoelectric Plethysmograph (PPG) waveforms and toe systolic pressure readings are included as required and additional duplex testing as needed. Limited examinations for reoccurring indications may be performed as noted.  ABI Findings: +---------+------------------+-----+---------+--------+ Right    Rt Pressure (mmHg)IndexWaveform Comment  +---------+------------------+-----+---------+--------+ Brachial 109                                      +---------+------------------+-----+---------+--------+ PTA      134               1.22 triphasic         +---------+------------------+-----+---------+--------+ DP       140               1.27 triphasic         +---------+------------------+-----+---------+--------+ Great Toe122               1.11 Normal            +---------+------------------+-----+---------+--------+ +---------+------------------+-----+---------+-------+ Left     Lt Pressure (mmHg)IndexWaveform Comment +---------+------------------+-----+---------+-------+ Brachial 110                                     +---------+------------------+-----+---------+-------+ PTA      144               1.31 triphasic        +---------+------------------+-----+---------+-------+ DP       145               1.32 triphasic        +---------+------------------+-----+---------+-------+ Great Toe121               1.10 Normal           +---------+------------------+-----+---------+-------+  Summary: Right: Resting right ankle-brachial index is within normal range. The right toe-brachial index is normal. Left: Resting left  ankle-brachial index is within normal range. The left toe-brachial index is normal. *See table(s) above for measurements and observations.  Electronically signed by Mikki Alexander MD on 06/27/2023 at 4:17:15 PM.    Final    US  THYROID  Result Date: 06/26/2023 CLINICAL DATA:  Hypothyroidism EXAM: THYROID  ULTRASOUND TECHNIQUE: Ultrasound examination of the thyroid  gland and adjacent soft tissues was performed. COMPARISON:  None Available. FINDINGS: Parenchymal Echotexture: Markedly heterogenous Isthmus: 6 mm Right lobe: 4.0 x 1.4 x 1.8 cm Left lobe: 3.5 x 1.3 x 1.4 cm _________________________________________________________ Estimated total number of nodules >/= 1 cm: 0 Number of spongiform nodules >/=  2 cm not described below (TR1): 0 Number of mixed cystic and solid nodules >/= 1.5 cm not described below (TR2): 0 _________________________________________________________ Markedly heterogeneous thyroid  gland compatible with chronic medical thyroid  disease. Incidental left mid thyroid  subcentimeter solid hyperechoic TR 3 type nodule measures only 6 mm. Given size (<1.4 cm) and appearance, this nodule does NOT meet TI-RADS criteria for biopsy or dedicated follow-up. No additional significant thyroid  abnormality. IMPRESSION: 1.  Markedly heterogeneous thyroid  gland compatible with chronic medical thyroid  disease. Subcentimeter nodule noted as above. 2. No other significant finding by ultrasound. Electronically Signed   By: Melven Stable.  Shick [name]   On: 06/26/2023 15:18   ECHOCARDIOGRAM COMPLETE Result Date: 06/18/2023    ECHOCARDIOGRAM REPORT   Patient Name:   [name] Date of Exam: 06/18/2023 Medical Rec #:  [id number]            Height:       64.0 in Accession #:    [id number]           Weight:       193.0 lb Date of Birth:  10/01/1975             BSA:          1.927 m Patient Age:    47 years             BP:           110/72 mmHg Patient Gender: F                    HR:           76 bpm. Exam Location:  [facility] Procedure:  2D Echo, Cardiac Doppler, Color Doppler and Strain Analysis (Both            Spectral and Color Flow Doppler were utilized during procedure). Indications:     Dyspnea on exertion R06.09                  Dyspnea R06.00  History:         Patient has no prior history of Echocardiogram examinations.                  COPD. Anemia, tobacco use.  Sonographer:     Broadus Canes Referring Phys:  [id number] [name] DUGAL Diagnosing Phys: Antionette Kirks MD  Sonographer Comments: Global longitudinal strain was attempted. IMPRESSIONS  1. Left ventricular ejection fraction, by estimation, is 55 to 60%. The left ventricle has normal function. The left ventricle has no regional wall motion abnormalities. Left ventricular diastolic parameters were normal. The average left ventricular global longitudinal strain is -17.3 %. The global longitudinal strain is normal.  2. Right ventricular systolic function is normal. The right ventricular size is normal. There is normal pulmonary artery systolic pressure.  3. The mitral valve is normal in structure. No evidence of mitral valve regurgitation. No evidence of mitral stenosis.  4. The aortic valve is normal in structure. Aortic valve regurgitation is not visualized. No aortic stenosis is present.  5. The inferior vena cava is normal in size with greater than 50% respiratory variability, suggesting right atrial pressure of 3 mmHg. FINDINGS  Left Ventricle: Left ventricular ejection fraction, by estimation, is 55 to 60%. The left ventricle has normal function. The left ventricle has no regional wall motion abnormalities. The average left ventricular global longitudinal strain is -17.3 %. Strain was performed and the global longitudinal strain is normal. The left ventricular internal cavity size was normal in size. There is no left ventricular hypertrophy. Left ventricular diastolic parameters were normal. Right Ventricle: The right ventricular size is normal. No increase in right ventricular wall  thickness. Right ventricular systolic function is normal. There is normal pulmonary artery systolic pressure. The tricuspid regurgitant velocity is 1.67 m/s, and  with an assumed right atrial pressure of 3 mmHg, the estimated right ventricular systolic pressure is 14.2 mmHg. Left Atrium:  Left atrial size was normal in size. Right Atrium: Right atrial size was normal in size. Pericardium: There is no evidence of pericardial effusion. Mitral Valve: The mitral valve is normal in structure. No evidence of mitral valve regurgitation. No evidence of mitral valve stenosis. MV peak gradient, 4.2 mmHg. The mean mitral valve gradient is 2.0 mmHg. Tricuspid Valve: The tricuspid valve is normal in structure. Tricuspid valve regurgitation is not demonstrated. No evidence of tricuspid stenosis. Aortic Valve: The aortic valve is normal in structure. Aortic valve regurgitation is not visualized. No aortic stenosis is present. Aortic valve mean gradient measures 3.0 mmHg. Aortic valve peak gradient measures 5.8 mmHg. Aortic valve area, by VTI measures 3.32 cm. Pulmonic Valve: The pulmonic valve was normal in structure. Pulmonic valve regurgitation is not visualized. No evidence of pulmonic stenosis. Aorta: The aortic root is normal in size and structure. Venous: The inferior vena cava is normal in size with greater than 50% respiratory variability, suggesting right atrial pressure of 3 mmHg. IAS/Shunts: No atrial level shunt detected by color flow Doppler.  LEFT VENTRICLE PLAX 2D LVIDd:         5.50 cm   Diastology LVIDs:         3.30 cm   LV e' medial:    11.50 cm/s LV PW:         0.90 cm   LV E/e' medial:  8.7 LV IVS:        1.00 cm   LV e' lateral:   11.70 cm/s LVOT diam:     2.20 cm   LV E/e' lateral: 8.5 LV SV:         90 LV SV Index:   47        2D Longitudinal Strain LVOT Area:     3.80 cm  2D Strain GLS Avg:     -17.3 %  RIGHT VENTRICLE RV Basal diam:  1.90 cm RV Mid diam:    1.90 cm RV S prime:     11.00 cm/s TAPSE  (M-mode): 2.6 cm LEFT ATRIUM           Index        RIGHT ATRIUM           Index LA diam:      3.80 cm 1.97 cm/m   RA Area:     11.20 cm LA Vol (A2C): 24.3 ml 12.61 ml/m  RA Volume:   21.10 ml  10.95 ml/m LA Vol (A4C): 51.0 ml 26.47 ml/m  AORTIC VALVE AV Area (Vmax):    3.71 cm AV Area (Vmean):   3.11 cm AV Area (VTI):     3.32 cm AV Vmax:           120.00 cm/s AV Vmean:          88.500 cm/s AV VTI:            0.271 m AV Peak Grad:      5.8 mmHg AV Mean Grad:      3.0 mmHg LVOT Vmax:         117.00 cm/s LVOT Vmean:        72.500 cm/s LVOT VTI:          0.237 m LVOT/AV VTI ratio: 0.87  AORTA Ao Root diam: 3.10 cm MITRAL VALVE               TRICUSPID VALVE MV Area (PHT): 4.57 cm    TR Peak grad:   11.2 mmHg MV  Area VTI:   3.22 cm    TR Vmax:        167.00 cm/s MV Peak grad:  4.2 mmHg MV Mean grad:  2.0 mmHg    SHUNTS MV Vmax:       1.03 m/s    Systemic VTI:  0.24 m MV Vmean:      62.9 cm/s   Systemic Diam: 2.20 cm MV Decel Time: 166 msec MV E velocity: 99.60 cm/s MV A velocity: 68.10 cm/s MV E/A ratio:  1.46 Antionette Kirks MD Electronically signed by Antionette Kirks MD Signature Date/Time: 06/18/2023/11:45:05 AM    Final      Assessment & Plan:   1. Mild obstructive sleep apnea (Primary) - Ambulatory Referral for DME  2. COPD mixed type (HCC) - Pulmonary function test; Future  Assessment and Plan    Moderate to severe COPD Patient has moderate to severe COPD with lung function at 54% based on pulmonary function testing from 2022. Patient has significant symptom burden with productive cough, dyspnea and wheezing. Continues to smoke, but interested in quitting. Recent CT scan showed resolution of previous pneumonia. Echocardiogram shows normal heart function and pulmonary artery pressure. Smoking cessation is crucial for management. Trelegy is considered as a replacement for Symbicort  and Spiriva  due to potential benefits in medication delivery to smaller airways. Daliresp is recommended to prevent  exacerbations, with potential side effects of mood changes. Azithromycin  is considered as an alternative if Daliresp is not affordable and/or add on treatment if patient has partial improvement from addition of PDE4 inhibitor. Pulmonary rehab is recommended to improve exercise tolerance. Prednisone  taper is considered for acute management. Long-term prednisone  use is not preferred due to risks of drug induced hyperglycemia, osteoporosis, and immune suppression. Biologic therapy with Nucala is a potential option for recurrent exacerbations. - Start Trelegy 200 in place of Symbicort  and Spiriva . - Continue budesonide  nebulizer up to twice a day as needed for wheezing. - Use ipratropium albuterol  inhaler every six hours as needed for shortness of breath. - Start Daliresp (roflumilast) 250 mcg for four weeks, then increase to 500 mcg if tolerated. - Consider azithromycin  Monday, Wednesday, Friday if Daliresp is not affordable or partially effective. - Repeat pulmonary function tests. - Refer to pulmonary rehab for exercise tolerance improvement. - Discuss smoking cessation as a priority. - Prescribe prednisone  taper for wheezing. - Consider biologic therapy with Nucala for recurrent exacerbations.  Mild sleep apnea Mild sleep apnea with 11.8 apneic events per hour and oxygen  desaturation to 83%. Contributes to fatigue and increased risk for cardiac arrhythmias and pulmonary hypertension. CPAP therapy is recommended to improve fatigue and decrease risk for cardiac complications. - Start CPAP therapy auto settings 5-15cm h20   Cardiomegaly Borderline cardiomegaly noted on CT scan. Echocardiogram shows normal heart function and ejection fraction. Normal pulmonary artery pressure on echocardiogram.  Pneumonia Previous pneumonia in the right lower lobe resolved as per March CT scan.  Depression Mild depression reported with no suicidal thoughts. Potential mood changes with Daliresp discussed. No  contraindications with current medications.   Recording duration: 19 minutes      Antonio Baumgarten, NP 07/17/2023

## 2023-07-17 NOTE — Telephone Encounter (Signed)
 I have an apt with her today! Thanks

## 2023-07-17 NOTE — Patient Instructions (Addendum)
-  MODERATE TO SEVERE COPD: Chronic Obstructive Pulmonary Disease (COPD) is a long-term lung condition that makes it hard to breathe. We will start you on Trelegy 200 instead of Symbicort  and Spiriva , continue your budesonide  nebulizer as needed, and use the ipratropium albuterol  inhaler every six hours as needed. We will also start you on Daliresp to prevent exacerbations, and if it's not affordable, we may consider azithromycin . Pulmonary rehab is recommended to improve your exercise tolerance, and a prednisone  taper is prescribed for wheezing. We will also consider biologic therapy with Nucala if you have recurrent exacerbations. Smoking cessation remains a priority.  -MILD EMPHYSEMA: Emphysema is a type of COPD that involves damage to the air sacs in the lungs. Your recent CT scan shows improvement in your lung condition with decreased atelectasis and resolution of pneumonia.  -MILD SLEEP APNEA: Sleep apnea is a condition where you have pauses in breathing or shallow breaths during sleep. This contributes to your fatigue and increases the risk for heart problems. We recommend starting CPAP therapy to improve your sleep quality and reduce fatigue.  -PULMONARY HYPERTENSION: Pulmonary hypertension is high blood pressure in the lungs' arteries. Your recent tests show improvement in pulmonary artery size and normal pulmonary artery pressure.  -CARDIOMEGALY: Cardiomegaly means an enlarged heart. Your echocardiogram shows normal heart function and ejection fraction, so no immediate action is needed.  -PNEUMONIA: Pneumonia is an infection that inflames the air sacs in one or both lungs. Your previous pneumonia in the right lower lobe has resolved.  -DEPRESSION: Depression is a mood disorder that causes a persistent feeling of sadness and loss of interest. You reported mild depression without suicidal thoughts. We discussed potential mood changes with Daliresp, but there are no contraindications with your  current medications.  INSTRUCTIONS: Please follow up with pulmonary rehab to improve your exercise tolerance. Continue with the prescribed medications and start CPAP therapy for your sleep apnea. Repeat pulmonary function tests as scheduled. Smoking cessation is crucial, so continue using Chantix  and seek support if needed. If you experience any new or worsening symptoms, please contact our office immediately.  Follow-up 4 weeks with Beth NP/ virtual ok CALL if you want prescription sent in for [facility] after sample x 2 weeks

## 2023-07-19 ENCOUNTER — Encounter (HOSPITAL_BASED_OUTPATIENT_CLINIC_OR_DEPARTMENT_OTHER)

## 2023-07-19 ENCOUNTER — Ambulatory Visit: Admitting: Primary Care

## 2023-07-19 ENCOUNTER — Encounter (HOSPITAL_BASED_OUTPATIENT_CLINIC_OR_DEPARTMENT_OTHER): Payer: Self-pay | Admitting: Primary Care

## 2023-07-30 ENCOUNTER — Encounter: Payer: Self-pay | Admitting: Family

## 2023-07-30 ENCOUNTER — Ambulatory Visit (INDEPENDENT_AMBULATORY_CARE_PROVIDER_SITE_OTHER): Admitting: Family

## 2023-07-30 ENCOUNTER — Ambulatory Visit: Payer: Self-pay | Admitting: Family

## 2023-07-30 VITALS — BP 130/88 | HR 84 | Temp 98.3°F | Ht 64.0 in | Wt 203.0 lb

## 2023-07-30 DIAGNOSIS — J449 Chronic obstructive pulmonary disease, unspecified: Secondary | ICD-10-CM | POA: Diagnosis not present

## 2023-07-30 DIAGNOSIS — E039 Hypothyroidism, unspecified: Secondary | ICD-10-CM

## 2023-07-30 DIAGNOSIS — J029 Acute pharyngitis, unspecified: Secondary | ICD-10-CM

## 2023-07-30 DIAGNOSIS — K909 Intestinal malabsorption, unspecified: Secondary | ICD-10-CM

## 2023-07-30 LAB — POCT RAPID STREP A (OFFICE): Rapid Strep A Screen: NEGATIVE

## 2023-07-30 LAB — TSH: TSH: 19.81 m[IU]/L — ABNORMAL HIGH

## 2023-07-30 NOTE — Patient Instructions (Addendum)
  For your endocrinologist for thyroid  Call to schedule [phone]  Start zyrtec, xyzal and or claritin for your allergies   Call pharmacy to check on status of dariresp   Check into CPAP machine

## 2023-07-30 NOTE — Progress Notes (Unsigned)
 Established Patient Office Visit  Subjective:      CC:  Chief Complaint  Patient presents with   Medical Management of Chronic Issues    HPI: [name] is a 48 y.o. female presenting on 07/30/2023 for Medical Management of Chronic Issues . Moderate to severe copd, recently seen by pulmonary. Started on trelegy instead of symbicort  and spiriva , advised to continue budesonide  nebulizer up to twice a day prn wheezing, ipratropium albuterol  inhaler prn and advised to start daliresp  250 mcg for four weeks then increase to 500 mg. Referred to pulmonary rehab as well. She had PFT scheduled but missed appt and she is rescheduled for July. She had missed the appt because she had to get a new phone.   OSA, cpap therapy was recommended. Currently has been ordered with pulmonary she is awaiting to get set up with this.   Hypothyroid: heterogenous thyroid  gland compatible with chronic medical thyroid  disease. Left thyroid  nodule 6 mm no f/u needed. She is currently taking 225 mcg once daily of synthroid  brand Lab Results  Component Value Date   TSH 19.81 (H) 07/30/2023   Two days ago started with nasal congestion and runny nose. She does have a slight sore throat.   Smoking cessation, is on 1/2 pack cigarettes daiy and on chantix . It is really hard for her to quit completely.   Pt would like to extend her STD for now as she is still needing to f/u with pulmonary, endo and vascular as well as with me. She is feeling slightly better but not completely still sob and with doe.    Social history:  Relevant past medical, surgical, family and social history reviewed and updated as indicated. Interim medical history since our last visit reviewed.  Allergies and medications reviewed and updated.  DATA REVIEWED: CHART IN EPIC     ROS: Negative unless specifically indicated above in HPI.    Current Outpatient Medications:    albuterol  (VENTOLIN  HFA) 108 (90 Base) MCG/ACT inhaler,  Inhale 2 puffs into the lungs every 4 (four) hours as needed., Disp: 18 g, Rfl: 2   ALPRAZolam  (XANAX ) 0.25 MG tablet, Take 1 tablet (0.25 mg total) by mouth 2 (two) times daily as needed for anxiety or sleep., Disp: 30 tablet, Rfl: 0   azelastine  (ASTELIN ) 0.1 % nasal spray, Place 1 spray into both nostrils 2 (two) times daily. Use in each nostril as directed, Disp: 30 mL, Rfl: 1   budesonide  (PULMICORT ) 0.5 MG/2ML nebulizer solution, Take 2 mLs (0.5 mg total) by nebulization 2 (two) times daily as needed (Wheezing)., Disp: 60 mL, Rfl: 0   budesonide -formoterol  (SYMBICORT ) 160-4.5 MCG/ACT inhaler, Inhale 2 puffs into the lungs 2 (two) times daily., Disp: , Rfl:    cyclobenzaprine  (FLEXERIL ) 10 MG tablet, Take 1 tablet (10 mg total) by mouth 3 (three) times daily as needed for muscle spasms., Disp: 30 tablet, Rfl: 0   FLUoxetine  (PROZAC ) 20 MG tablet, Take 1 tablet (20 mg total) by mouth daily., Disp: 90 tablet, Rfl: 3   Fluticasone -Umeclidin-Vilant (TRELEGY ELLIPTA ) 200-62.5-25 MCG/ACT AEPB, Inhale 1 puff into the lungs daily., Disp: , Rfl:    gabapentin  (NEURONTIN ) 300 MG capsule, TAKE 1 CAPSULE BY MOUTH ONCE DAILY AS NEEDED FOR PAIN, Disp: 90 capsule, Rfl: 0   meclizine  (ANTIVERT ) 25 MG tablet, TAKE 1/2 TO 1 (ONE-HALF TO ONE) TABLET BY MOUTH ONCE DAILY AS NEEDED FOR VERTIGO, Disp: 30 tablet, Rfl: 0   meloxicam  (MOBIC ) 7.5 MG tablet, Take 1 tablet (  7.5 mg total) by mouth 2 (two) times daily., Disp: 180 tablet, Rfl: 0   Nebulizer System All-In-One MISC, 1 Device by Does not apply route daily as needed., Disp: 1 each, Rfl: 0   oxyCODONE -acetaminophen  (PERCOCET) 10-325 MG tablet, Take 1 tablet by mouth 4 (four) times daily as needed., Disp: , Rfl:    predniSONE  (DELTASONE ) 10 MG tablet, 4 tabs for 3 days, then 3 tabs for 3 days, 2 tabs for 3 days, then 1 tab for 3 days, then stop, Disp: 30 tablet, Rfl: 0   Roflumilast  (DALIRESP ) 250 MCG TABS, Take 1 tablet by mouth daily., Disp: 28 tablet, Rfl: 0    SYNTHROID  200 MCG tablet, Take 1 tablet (200 mcg total) by mouth daily before breakfast., Disp: 30 tablet, Rfl: 1   Tiotropium Bromide Monohydrate  (SPIRIVA  RESPIMAT) 2.5 MCG/ACT AERS, Inhale 2 puffs into the lungs daily., Disp: 1 each, Rfl: 5   varenicline  (CHANTIX ) 0.5 MG tablet, Take 0.5 mg by mouth 2 (two) times daily., Disp: , Rfl:   Current Facility-Administered Medications:    ipratropium-albuterol  (DUONEB) 0.5-2.5 (3) MG/3ML nebulizer solution 3 mL, 3 mL, Nebulization, Q6H, Antonio Baumgarten, NP, 3 mL at 11/17/21 1357      Objective:    BP 130/88 (BP Location: Left Arm, Patient Position: Sitting, Cuff Size: Normal)   Pulse 84   Temp 98.3 F (36.8 C) (Temporal)   Ht 5' 4 (1.626 m)   Wt 203 lb (92.1 kg)   LMP 08/22/2022 (Approximate)   SpO2 96%   BMI 34.84 kg/m   Wt Readings from Last 3 Encounters:  07/30/23 203 lb (92.1 kg)  07/17/23 197 lb 12.8 oz (89.7 kg)  07/06/23 198 lb (89.8 kg)    Physical Exam Vitals reviewed.  Constitutional:      General: She is not in acute distress.    Appearance: Normal appearance. She is normal weight. She is not ill-appearing, toxic-appearing or diaphoretic.  HENT:     Head: Normocephalic.     Right Ear: Hearing normal.     Left Ear: Hearing normal.     Mouth/Throat:     Pharynx: Posterior oropharyngeal erythema and postnasal drip present.   Cardiovascular:     Rate and Rhythm: Normal rate.  Pulmonary:     Effort: Pulmonary effort is normal.     Breath sounds: Decreased air movement and transmitted upper airway sounds present.   Musculoskeletal:        General: Normal range of motion.   Neurological:     General: No focal deficit present.     Mental Status: She is alert and oriented to Matthews, place, and time. Mental status is at baseline.   Psychiatric:        Mood and Affect: Mood normal.        Behavior: Behavior normal.        Thought Content: Thought content normal.        Judgment: Judgment normal.            Assessment & Plan:  Acquired hypothyroidism Assessment & Plan: Variable.  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    Sore throat -     POCT rapid strep A     Return in about 2 months (around 09/29/2023) for follow up short term disability .  Felicita Horns, MSN, APRN, FNP-C [facility]

## 2023-07-30 NOTE — Assessment & Plan Note (Signed)
 Extend STD through 09/29/23   Pt still needs time from work to stabilize and continue treatment plans with several specialists.

## 2023-07-30 NOTE — Telephone Encounter (Signed)
 I have faxed the referral, notes, sleep study to [place]. I have also spoke with the patient letting her know they would get her approved through her insurance and call her for the cpap setup

## 2023-07-31 ENCOUNTER — Other Ambulatory Visit: Payer: Self-pay | Admitting: Primary Care

## 2023-07-31 NOTE — Assessment & Plan Note (Signed)
 Variable.  Tsh ordered and pending Goal is 0.5-2  Advised pt to call endo to make appt to get scheduled U/s thyroid  reviewed no real indication to suggest reason for inability to control levels  Continue brand name synthroid  200 mcg once daily

## 2023-08-01 MED ORDER — LEVOTHYROXINE SODIUM 200 MCG PO CAPS
200.0000 ug | ORAL_CAPSULE | Freq: Every day | ORAL | 2 refills | Status: DC
Start: 2023-08-01 — End: 2023-09-11

## 2023-08-01 MED ORDER — TRELEGY ELLIPTA 200-62.5-25 MCG/ACT IN AEPB: 1.0000 | INHALATION_SPRAY | Freq: Every day | RESPIRATORY_TRACT | 5 refills | Status: DC

## 2023-08-07 ENCOUNTER — Telehealth: Payer: Self-pay

## 2023-08-07 NOTE — Telephone Encounter (Signed)
 Received short term disability benefit/attending physician's statement form to be completed by provider. Placed in providers box for review.

## 2023-08-09 NOTE — Telephone Encounter (Signed)
 Completed forms received and faxed to [employer] attn to Sandra Sharps at [phone] Copy sent to scan. Copy mailed to pt at the address on file, as was done with previous forms.

## 2023-08-09 NOTE — Telephone Encounter (Signed)
Paperwork completed and in outbox.

## 2023-08-15 ENCOUNTER — Ambulatory Visit: Admitting: Primary Care

## 2023-08-22 ENCOUNTER — Other Ambulatory Visit: Payer: Self-pay | Admitting: Primary Care

## 2023-08-31 ENCOUNTER — Ambulatory Visit: Admitting: Internal Medicine

## 2023-08-31 DIAGNOSIS — J449 Chronic obstructive pulmonary disease, unspecified: Secondary | ICD-10-CM

## 2023-08-31 NOTE — Progress Notes (Signed)
 Full PFT performed today.

## 2023-08-31 NOTE — Patient Instructions (Signed)
 Full PFT performed today.

## 2023-09-03 ENCOUNTER — Ambulatory Visit: Payer: Self-pay | Admitting: Primary Care

## 2023-09-03 LAB — PULMONARY FUNCTION TEST
DL/VA % pred: 84 %
DL/VA: 3.63 ml/min/mmHg/L
DLCO cor % pred: 72 %
DLCO cor: 15.9 ml/min/mmHg
DLCO unc % pred: 72 %
DLCO unc: 15.9 ml/min/mmHg
FEF 25-75 Post: 1.27 L/s
FEF 25-75 Pre: 0.7 L/s
FEF2575-%Change-Post: 79 %
FEF2575-%Pred-Post: 44 %
FEF2575-%Pred-Pre: 24 %
FEV1-%Change-Post: 19 %
FEV1-%Pred-Post: 56 %
FEV1-%Pred-Pre: 47 %
FEV1-Post: 1.66 L
FEV1-Pre: 1.39 L
FEV1FVC-%Change-Post: 4 %
FEV1FVC-%Pred-Pre: 76 %
FEV6-%Change-Post: 15 %
FEV6-%Pred-Post: 71 %
FEV6-%Pred-Pre: 61 %
FEV6-Post: 2.57 L
FEV6-Pre: 2.22 L
FEV6FVC-%Change-Post: 0 %
FEV6FVC-%Pred-Post: 101 %
FEV6FVC-%Pred-Pre: 101 %
FVC-%Change-Post: 14 %
FVC-%Pred-Post: 70 %
FVC-%Pred-Pre: 61 %
FVC-Post: 2.59 L
FVC-Pre: 2.26 L
Post FEV1/FVC ratio: 64 %
Post FEV6/FVC ratio: 99 %
Pre FEV1/FVC ratio: 62 %
Pre FEV6/FVC Ratio: 98 %
RV % pred: 142 %
RV: 2.58 L
TLC % pred: 106 %
TLC: 5.53 L

## 2023-09-07 ENCOUNTER — Ambulatory Visit: Admitting: Primary Care

## 2023-09-07 ENCOUNTER — Telehealth: Payer: Self-pay | Admitting: Primary Care

## 2023-09-07 ENCOUNTER — Encounter: Payer: Self-pay | Admitting: Primary Care

## 2023-09-07 ENCOUNTER — Other Ambulatory Visit: Payer: Self-pay | Admitting: Family

## 2023-09-07 ENCOUNTER — Telehealth: Admitting: Primary Care

## 2023-09-07 VITALS — Ht 64.0 in | Wt 205.0 lb

## 2023-09-07 DIAGNOSIS — Z9989 Dependence on other enabling machines and devices: Secondary | ICD-10-CM | POA: Diagnosis not present

## 2023-09-07 DIAGNOSIS — J449 Chronic obstructive pulmonary disease, unspecified: Secondary | ICD-10-CM | POA: Diagnosis not present

## 2023-09-07 DIAGNOSIS — G4733 Obstructive sleep apnea (adult) (pediatric): Secondary | ICD-10-CM

## 2023-09-07 DIAGNOSIS — F17210 Nicotine dependence, cigarettes, uncomplicated: Secondary | ICD-10-CM

## 2023-09-07 DIAGNOSIS — G8929 Other chronic pain: Secondary | ICD-10-CM

## 2023-09-07 DIAGNOSIS — Z720 Tobacco use: Secondary | ICD-10-CM

## 2023-09-07 MED ORDER — ROFLUMILAST 500 MCG PO TABS: 500.0000 ug | ORAL_TABLET | Freq: Every day | ORAL | 11 refills | Status: AC

## 2023-09-07 NOTE — Patient Instructions (Signed)
  VISIT SUMMARY: You came in today for a follow-up visit to discuss your COPD, chronic respiratory failure, sleep apnea, and smoking cessation efforts. We reviewed your current symptoms, medication regimen, and challenges with smoking cessation and CPAP therapy.  YOUR PLAN: -CHRONIC OBSTRUCTIVE PULMONARY DISEASE (COPD): COPD is a chronic lung disease that makes it hard to breathe. Your lung function has declined by 7% over the past three years. We will continue with Trelegy if it is affordable for you, and you should use the budesonide  nebulizer as needed for wheezing (up to twice daily) and ipratropium albuterol  for shortness of breath (up to four times daily). We will increase your Daliresp  dose to 500 mcg to help reduce flare-ups. We will also ensure that pulmonary rehab contacts you.  -CHRONIC RESPIRATORY FAILURE: Chronic respiratory failure means your lungs are not getting enough oxygen  into your blood or removing enough carbon dioxide from it. This is likely due to your COPD. There have been no acute flare-ups since June.  -TOBACCO USE DISORDER: Tobacco use disorder means you are addicted to smoking. Quitting smoking is crucial to prevent further decline in your lung function. We recommend reading 'Easy Ways to Quit Smoking' by Dasie Myron and emphasize the importance of quitting smoking.  -SLEEP APNEA: Sleep apnea is a condition where you stop breathing for short periods while you sleep. You have started using a CPAP machine, which may help reduce your frequent awakenings at night. We recommend applying for CPAP assistance through [email or web address] and will reassess your progress in three months.  INSTRUCTIONS: Please follow up in three months to evaluate your CPAP use and effectiveness. Ensure that pulmonary rehab contacts you. Continue with your current medications and follow the recommended dosages. Apply for CPAP assistance through [email or web address]. Read 'Easy  Ways to Quit Smoking' by Dasie Myron to help with smoking cessation.

## 2023-09-07 NOTE — Progress Notes (Signed)
 Virtual Visit via Video Note  I connected with [name] on 09/07/23 at  3:30 PM EDT by a video enabled telemedicine application and verified that I am speaking with the correct person using two identifiers.  Location: Patient: Home Provider: Office    I discussed the limitations of evaluation and management by telemedicine and the availability of in person appointments. The patient expressed understanding and agreed to proceed.  History of Present Illness: 48 year old female, current everyday smoker (25 pack year hx). PMH significant for COPD GOLD II, CAP, acute respiratory failure, hypothyroidism, alcohol  dependence, major depression, anemia, tobacco abuse. Patient of Dr. Darlean.   Previous [facility] pulmonary encounter: 07/17/2023 Discussed the use of AI scribe software for clinical note transcription with the patient, who gave verbal consent to proceed.   History of Present Illness   [name] is a 48 year old female with moderate to severe COPD who presents with persistent shortness of breath and fatigue.   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 Her energy levels deplete quickly, often within minutes of starting an activity. Symptoms are manageable when sitting still but worsen with exertion. These issues persist despite previous evaluations and treatments.   She has moderate to severe COPD, with her last pulmonary function test in 2022 showing lung function at 54%, indicating stage 2 to 3 COPD. She continues to smoke and has started using Chantix  to aid in smoking cessation. Her current medications include Symbicort  and Spiriva  inhalers, but her insurance does not cover Breztri . She has not tried Trelegy before. She uses a rescue inhaler, albuterol , and a nebulizer with budesonide  or Pulmicort  up to twice a day as needed for wheezing.   CT chest in March showed borderline  cardiomegaly.  Interval decrease in size of enlarged main pulmonary artery.  Interval improvement in bilateral atelectasis/scarring.  Resolved pneumonia right lower lobe. A recent chest x-ray in May showed no acute cardiopulmonary disease. An echocardiogram in May showed an ejection fraction of 55 to 60%.   Home sleep study on 05/21/2023 showed mild obstructive sleep apnea, AHI 4% 11.8/hour.  SpO2 low 83%.  Patient spent 48 minutes with an oxygen  level less than 88%.  Recommend patient be started on auto CPAP 5 to 15 cm H2O.    She has been on prednisone  previously, which significantly improves her symptoms, although the effects are temporary. She has not been on antibiotics since March.   She is concerned about her ability to return to work due to her current health status and is worried about losing her insurance coverage. She is currently on short-term disability.       1. Mild obstructive sleep apnea (Primary) - Ambulatory Referral for DME   2. COPD mixed type (HCC) - Pulmonary function test; Future   Assessment and Plan    Moderate to severe COPD Patient has moderate to severe COPD with lung function at 54% based on pulmonary function testing from 2022. Patient has significant symptom burden with productive cough, dyspnea and wheezing. Continues to smoke, but interested in quitting. Recent CT scan showed resolution of previous pneumonia. Echocardiogram shows normal heart function and pulmonary artery pressure. Smoking cessation is crucial for management. Trelegy is considered as a replacement for Symbicort  and Spiriva  due to potential benefits in medication delivery to smaller airways. Daliresp  is recommended to prevent exacerbations, with potential side effects of mood changes. Azithromycin  is considered as an alternative if  Daliresp  is not affordable and/or add on treatment if patient has partial improvement from addition of PDE4 inhibitor. Pulmonary rehab is recommended to improve exercise  tolerance. Prednisone  taper is considered for acute management. Long-term prednisone  use is not preferred due to risks of drug induced hyperglycemia, osteoporosis, and immune suppression. Biologic therapy with Nucala is a potential option for recurrent exacerbations. - Start Trelegy 200 in place of Symbicort  and Spiriva . - Continue budesonide  nebulizer up to twice a day as needed for wheezing. - Use ipratropium albuterol  inhaler every six hours as needed for shortness of breath. - Start Daliresp  (roflumilast ) 250 mcg for four weeks, then increase to 500 mcg if tolerated. - Consider azithromycin  Monday, Wednesday, Friday if Daliresp  is not affordable or partially effective. - Repeat pulmonary function tests. - Refer to pulmonary rehab for exercise tolerance improvement. - Discuss smoking cessation as a priority. - Prescribe prednisone  taper for wheezing. - Consider biologic therapy with Nucala for recurrent exacerbations.   Mild sleep apnea Mild sleep apnea with 11.8 apneic events per hour and oxygen  desaturation to 83%. Contributes to fatigue and increased risk for cardiac arrhythmias and pulmonary hypertension. CPAP therapy is recommended to improve fatigue and decrease risk for cardiac complications. - Start CPAP therapy auto settings 5-15cm h20    Cardiomegaly Borderline cardiomegaly noted on CT scan. Echocardiogram shows normal heart function and ejection fraction. Normal pulmonary artery pressure on echocardiogram.   Pneumonia Previous pneumonia in the right lower lobe resolved as per March CT scan.   Depression Mild depression reported with no suicidal thoughts. Potential mood changes with Daliresp  discussed. No contraindications with current medications.   09/07/2023- Interim hx  Discussed the use of AI scribe software for clinical note transcription with the patient, who gave verbal consent to proceed.  History of Present Illness   [name] is a 48 year old female with  COPD, chronic respiratory failure, and sleep apnea who presents for a follow-up visit.  She has moderate to severe COPD and chronic respiratory failure, with persistent symptoms of cough, shortness of breath, and wheezing. Despite previous discussions about quitting, she continues to smoke. Her lung function has declined by 7% over the past three years. Her medication regimen has transitioned from Symbicort  and Spiriva  to Trelegy, but her symptoms remain unchanged. She has been using samples and is uncertain about insurance coverage for Trelegy.  She was started on Daliresp  at 250 micrograms for four weeks. She has not experienced any flare-ups requiring antibiotics or urgent care visits since June. She uses a budesonide  nebulizer as needed for wheezing and ipratropium albuterol  every six hours for shortness of breath. She is out of refills for her Ventolin  inhaler.  She has attempted various smoking cessation methods, including gum, Chantix , and the patch, but has not been successful. Currently, she is using half a patch. Stress, particularly from caring for her mother, makes quitting challenging. She has oxygen  at home but does not use it regularly.  She recently prescribed CPAP machine for her sleep apnea but faces financial challenges with copays. She has filled out a hardship form and is awaiting a response. She wakes up frequently at night, approximately 10 to 11 times, and hopes the CPAP will help reduce this.  She is interested in returning to work and is awaiting a response to her application. She has not been contacted about pulmonary rehab but is open to participating.      Observations/Objective:  Appears well without overt respiratory symptoms   Assessment and  Plan:  1. COPD mixed type (HCC) (Primary) - AMB referral to pulmonary rehabilitation  2. Tobacco abuse  3. Mild obstructive sleep apnea   Chronic Obstructive Pulmonary Disease (COPD) Moderate to severe COPD with a 7%  decline in lung function over the past three years. No exacerbations requiring antibiotics or hospitalization since June. Trelegy is effectively replacing Symbicort  and Spiriva . Daliresp  (Roflumilast ) initiated at 250 mcg, with plans to increase to 500 mcg. Awaiting contact from pulmonary rehab. - Continue Trelegy 200mcg  - Use budesonide  nebulizer as needed for wheezing, up to twice daily. - Use ipratropium albuterol  for dyspnea or chest tightness, up to four times daily. - Increase Daliresp  to 500 mcg to reduce COPD exacerbations. - Ensure pulmonary rehab contacts her. - Discussed smoking cessation as a priority. - Consider biologic therapy with Nucala for recurrent exacerbations if needed in the future   Tobacco Use Disorder Continues smoking despite previous cessation attempts with gum, Chantix , and the patch. Smoking cessation is crucial to prevent further lung function decline. Stress and caregiving responsibilities hinder quitting. - Recommend 'Easy Ways to Quit Smoking' by Dasie Myron. - Emphasize the importance of smoking cessation to prevent further lung function decline.  Sleep Apnea Sleep apnea. CPAP therapy recommended but cost is a barrier. Frequent nocturnal awakenings, which CPAP may alleviate. - Apply for CPAP assistance through [email or web address]. - Reassess in three months to evaluate CPAP use and effectiveness.  Follow Up Instructions:  3 months with Landry NP    I discussed the assessment and treatment plan with the patient. The patient was provided an opportunity to ask questions and all were answered. The patient agreed with the plan and demonstrated an understanding of the instructions.   The patient was advised to call back or seek an in-person evaluation if the symptoms worsen or if the condition fails to improve as anticipated.  I provided 22 minutes of non-face-to-face time during this encounter.   Kari LELON Ferrari, NP

## 2023-09-07 NOTE — Telephone Encounter (Signed)
 Appt made. NFN

## 2023-09-07 NOTE — Telephone Encounter (Signed)
 Patient needs 3 months follow-up with Beth for CPAP and COPD

## 2023-09-10 ENCOUNTER — Ambulatory Visit (INDEPENDENT_AMBULATORY_CARE_PROVIDER_SITE_OTHER): Admitting: Family

## 2023-09-10 ENCOUNTER — Other Ambulatory Visit (HOSPITAL_COMMUNITY): Payer: Self-pay

## 2023-09-10 ENCOUNTER — Encounter: Payer: Self-pay | Admitting: Family

## 2023-09-10 ENCOUNTER — Telehealth: Payer: Self-pay | Admitting: Family

## 2023-09-10 ENCOUNTER — Ambulatory Visit: Payer: Self-pay

## 2023-09-10 ENCOUNTER — Telehealth: Payer: Self-pay

## 2023-09-10 VITALS — BP 110/82 | HR 82 | Temp 98.2°F

## 2023-09-10 DIAGNOSIS — R42 Dizziness and giddiness: Secondary | ICD-10-CM | POA: Diagnosis not present

## 2023-09-10 DIAGNOSIS — Z720 Tobacco use: Secondary | ICD-10-CM | POA: Diagnosis not present

## 2023-09-10 DIAGNOSIS — K909 Intestinal malabsorption, unspecified: Secondary | ICD-10-CM

## 2023-09-10 DIAGNOSIS — J449 Chronic obstructive pulmonary disease, unspecified: Secondary | ICD-10-CM

## 2023-09-10 DIAGNOSIS — E039 Hypothyroidism, unspecified: Secondary | ICD-10-CM

## 2023-09-10 DIAGNOSIS — F329 Major depressive disorder, single episode, unspecified: Secondary | ICD-10-CM

## 2023-09-10 DIAGNOSIS — D649 Anemia, unspecified: Secondary | ICD-10-CM | POA: Diagnosis not present

## 2023-09-10 NOTE — Addendum Note (Signed)
 Addended by: CLAUDENE NEVINS A on: 09/10/2023 12:24 PM   Modules accepted: Orders

## 2023-09-10 NOTE — Telephone Encounter (Signed)
 FYI Only or Action Required?: Action required by provider: clinical question for provider.  Patient was last seen in primary care on 09/10/2023 by Corwin Antu, FNP.  Called Nurse Triage reporting Wants to stay out of work.  Symptoms began today.  Interventions attempted: Nothing.  Symptoms are: unchanged. Pt. Seen in office today. Decided she needs to stay out of work and apply for disability. Requests PCP call her back. Please advise pt.  Triage Disposition: Information or Advice Only Call  Patient/caregiver understands and will follow disposition?: Yes   Copied from CRM #[id number]. Topic: Clinical - Red Word Triage >> Sep 10, 2023  2:06 PM [name] wrote: Red Word that prompted transfer to Nurse Triage: patient called in stated she spoke to someone earlier about filling out paperwork to return back to work but since that call she had another asthma attack and she got shortness of breath and oxygen  dropped really low. Asking to speak to nurse, this just happened and she doesn't want to go to work anymore she said , she's gonna file for disability now instead Reason for Disposition  [1] Follow-up call to recent contact AND [2] information only call, no triage required  Answer Assessment - Initial Assessment Questions 1. REASON FOR CALL: What is the main reason for your call? or How can I best help you?     Pt. Seen today in office and states she decided to stay out of work and apply for disability. 2. SYMPTOMS : Do you have any symptoms?      Breathing difficulties 3. OTHER QUESTIONS: Do you have any other questions?     no  Protocols used: Information Only Call - No Triage-A-AH

## 2023-09-10 NOTE — Progress Notes (Signed)
 Established Patient Office Visit  Subjective:      CC:  Chief Complaint  Patient presents with   Medical Management of Chronic Issues    HPI: [name] is a 48 y.o. female presenting on 09/10/2023 for Medical Management of Chronic Issues  Last appt July 30, 2023.  Currently on STD extended through 09/29/23.   Hypothyroid: still not controlled and fluctuating. She has been referred to endo but has not been called to get scheduled. She was switched to tirosint  but insurance would not cover it so she is on brand name synthroid  200 mcg once daily.  Lab Results  Component Value Date   TSH 5.35 09/10/2023   COPD with persistent sob and fatigue. Symptoms still affecting her ability to function, she tires out very easily due to DOE. She is followed with pulmonary.last visit they ordered PFT and advised to start trelegy 200 in place of symbicort  and spiriva , cont budesonide  neb up to bid prn wheezing. Advised to start daliresp  as well. Referral was also placed for pulmonary rehab and discussed smoking cessation.   She would like to try to return to work however she states it is really difficult as the work is very physical and hard for her to maintain her breathing and not become dizzy during working. 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   Still smoking, on chantix . She has decreased significantly but she does think she is having issues because she forgets to take her chantix  at night at times.     Social history:  Relevant past medical, surgical, family and social history reviewed and updated as indicated. Interim medical history since our last visit reviewed.  Allergies and medications reviewed and updated.  DATA REVIEWED: CHART IN EPIC     ROS: Negative unless specifically indicated above in HPI.    Current  Outpatient Medications:    albuterol  (VENTOLIN  HFA) 108 (90 Base) MCG/ACT inhaler, Inhale 2 puffs into the lungs every 4 (four) hours as needed., Disp: 18 g, Rfl: 2   ALPRAZolam  (XANAX ) 0.25 MG tablet, Take 1 tablet (0.25 mg total) by mouth 2 (two) times daily as needed for anxiety or sleep., Disp: 30 tablet, Rfl: 0   azelastine  (ASTELIN ) 0.1 % nasal spray, Place 1 spray into both nostrils 2 (two) times daily. Use in each nostril as directed, Disp: 30 mL, Rfl: 1   budesonide  (PULMICORT ) 0.5 MG/2ML nebulizer solution, USE 1 VIAL IN NEBULIZER TWICE DAILY AS NEEDED FOR WHEEZING, Disp: 60 mL, Rfl: 0   cyclobenzaprine  (FLEXERIL ) 10 MG tablet, Take 1 tablet (10 mg total) by mouth 3 (three) times daily as needed for muscle spasms., Disp: 30 tablet, Rfl: 0   Fluticasone -Umeclidin-Vilant (TRELEGY ELLIPTA ) 200-62.5-25 MCG/ACT AEPB, Inhale 1 puff into the lungs daily., Disp: 60 each, Rfl: 5   gabapentin  (NEURONTIN ) 300 MG capsule, TAKE 1 CAPSULE BY MOUTH ONCE DAILY AS NEEDED FOR PAIN, Disp: 90 capsule, Rfl: 0   meclizine  (ANTIVERT ) 25 MG tablet, TAKE 1/2 TO 1 (ONE-HALF TO ONE) TABLET BY MOUTH ONCE DAILY AS NEEDED FOR VERTIGO, Disp: 30 tablet, Rfl: 0   meloxicam  (MOBIC ) 7.5 MG tablet, Take 1 tablet (7.5 mg total) by mouth 2 (two) times daily., Disp: 180 tablet, Rfl: 0   Nebulizer System All-In-One MISC, 1 Device by Does not apply route  daily as needed., Disp: 1 each, Rfl: 0   oxyCODONE -acetaminophen  (PERCOCET) 10-325 MG tablet, Take 1 tablet by mouth 4 (four) times daily as needed., Disp: , Rfl:    predniSONE  (DELTASONE ) 10 MG tablet, 4 tabs for 3 days, then 3 tabs for 3 days, 2 tabs for 3 days, then 1 tab for 3 days, then stop, Disp: 30 tablet, Rfl: 0   roflumilast  (DALIRESP ) 500 MCG TABS tablet, Take 1 tablet (500 mcg total) by mouth daily., Disp: 30 tablet, Rfl: 11   varenicline  (CHANTIX ) 0.5 MG tablet, Take 0.5 mg by mouth 2 (two) times daily., Disp: , Rfl:    FLUoxetine  (PROZAC ) 40 MG capsule, Take 1 capsule  (40 mg total) by mouth daily., Disp: 90 capsule, Rfl: 3   SYNTHROID  200 MCG tablet, Take one tablet 200 mcg along with one 25 mcg tablet once daily (total 225 mcg once daily), Disp: 30 tablet, Rfl: 1   SYNTHROID  25 MCG tablet, Take 1 25 mcg tablet along with one 200 mcg tablet once daily for total of 225 mcg once daily, Disp: 30 tablet, Rfl: 1  Current Facility-Administered Medications:    ipratropium-albuterol  (DUONEB) 0.5-2.5 (3) MG/3ML nebulizer solution 3 mL, 3 mL, Nebulization, Q6H, Hope Almarie ORN, NP, 3 mL at 11/17/21 1357      Objective:    BP 110/82   Pulse 82   Temp 98.2 F (36.8 C) (Temporal)   LMP 08/22/2022 (Approximate)   SpO2 98%   Wt Readings from Last 3 Encounters:  09/07/23 205 lb (93 kg)  07/30/23 203 lb (92.1 kg)  07/17/23 197 lb 12.8 oz (89.7 kg)    Physical Exam Vitals reviewed.  Constitutional:      General: She is not in acute distress.    Appearance: Normal appearance. She is normal weight. She is not ill-appearing, toxic-appearing or diaphoretic.  HENT:     Head: Normocephalic.  Cardiovascular:     Rate and Rhythm: Normal rate and regular rhythm.  Pulmonary:     Effort: Pulmonary effort is normal.     Breath sounds: Normal breath sounds. Stridor and transmitted upper airway sounds present.  Musculoskeletal:        General: Normal range of motion.  Neurological:     General: No focal deficit present.     Mental Status: She is alert and oriented to person, place, and time. Mental status is at baseline.  Psychiatric:        Mood and Affect: Mood normal.        Behavior: Behavior normal.        Thought Content: Thought content normal.        Judgment: Judgment normal.           Assessment & Plan:  COPD mixed type [facility]) Assessment & Plan: At this time pt can not return to work, going to extend STD/LTD to 10/30/23 with one month f/u.  Will also follow with pulmonary as scheduled. Referral placed to endo as well for thyroid  workup.       Acquired hypothyroidism Assessment & Plan: Continue brand name synthroid   Failure levothryoxine Goal tirosint  as synthroid  still without control.  Prior auth submitted.   Orders: -     TSH -     T4, free  Tobacco abuse Assessment & Plan: Smoking cessation instruction/counseling given:  counseled patient on the dangers of tobacco use, advised patient to stop smoking, and reviewed strategies to maximize success   Has tried chantix , wellbutrin and also nicotine  patches.  Abnormal intestinal absorption -     Celiac Disease Panel  Dizziness -     IBC + Ferritin  Anemia, unspecified type -     IBC + Ferritin     Return in about 3 months (around 12/11/2023) for follow up thyroid  .  Ginger Patrick, MSN, APRN, FNP-C [facility]

## 2023-09-10 NOTE — Telephone Encounter (Signed)
 Can I change her work note?

## 2023-09-10 NOTE — Telephone Encounter (Signed)
 Copied from CRM [id number]. Topic: General - Other >> Sep 10, 2023 12:56 PM Berneda FALCON wrote: Reason for CRM: Pt would like to speak to the nurse [place], she thinks) about the work note that she received. She states the note states to take a break 10 min every hour and she states her job is not going to do that, she just needs a note stating she can go back to work with no restrictions and if that does not work she will file for disability.   Patient callback is [phone]

## 2023-09-10 NOTE — Patient Instructions (Signed)
  For your endocrinologist for thyroid  Call to schedule [phone]

## 2023-09-10 NOTE — Telephone Encounter (Signed)
 Can we start a prior auth for tirosint ?  Pt has had no success with both synthroid  brand and or levothryoxine, tsh still not controlled.

## 2023-09-10 NOTE — Assessment & Plan Note (Signed)
 Smoking cessation instruction/counseling given:  counseled patient on the dangers of tobacco use, advised patient to stop smoking, and reviewed strategies to maximize success   Has tried chantix , wellbutrin and also nicotine  patches.

## 2023-09-10 NOTE — Assessment & Plan Note (Signed)
 Continue brand name synthroid   Failure levothryoxine Goal tirosint  as synthroid  still without control.  Prior auth submitted.

## 2023-09-10 NOTE — Assessment & Plan Note (Signed)
 Pt would like o try to rtw 09/12/23  Can not initiate intermittent FMLA due to her work stating she has to be there x 1 year in order to have this in place. We will initiate once FMLA can be active within her company.  Will attempt to rtw, could consider disability through social security if needed as well.

## 2023-09-10 NOTE — Telephone Encounter (Signed)
 Pharmacy Patient Advocate Encounter   Received notification from CoverMyMeds that prior authorization for Tirosint  is required/requested.   Insurance verification completed.   The patient is insured through [facility] ADVANTAGE/RX ADVANCE .     Per test claim: The current 30 day co-pay is, $75.00.  No PA needed at this time. This test claim was processed through [facility]- copay amounts may vary at other pharmacies due to pharmacy/plan contracts, or as the patient moves through the different stages of their insurance plan.

## 2023-09-11 ENCOUNTER — Telehealth: Payer: Self-pay | Admitting: Family

## 2023-09-11 LAB — IBC + FERRITIN
Ferritin: 5 ng/mL — ABNORMAL LOW (ref 10.0–291.0)
Iron: 74 ug/dL (ref 42–145)
Saturation Ratios: 14.4 % — ABNORMAL LOW (ref 20.0–50.0)
TIBC: 515.2 ug/dL — ABNORMAL HIGH (ref 250.0–450.0)
Transferrin: 368 mg/dL — ABNORMAL HIGH (ref 212.0–360.0)

## 2023-09-11 LAB — TSH: TSH: 5.35 u[IU]/mL (ref 0.35–5.50)

## 2023-09-11 LAB — T4, FREE: Free T4: 1.33 ng/dL (ref 0.60–1.60)

## 2023-09-11 MED ORDER — SYNTHROID 25 MCG PO TABS: ORAL_TABLET | ORAL | 1 refills | Status: AC

## 2023-09-11 MED ORDER — FLUOXETINE HCL 40 MG PO CAPS
40.0000 mg | ORAL_CAPSULE | Freq: Every day | ORAL | 3 refills | Status: AC
Start: 2023-09-11 — End: ?

## 2023-09-11 MED ORDER — SYNTHROID 200 MCG PO TABS: ORAL_TABLET | ORAL | 1 refills | Status: AC

## 2023-09-11 NOTE — Addendum Note (Signed)
 Addended by: CORWIN ANTU on: 09/11/2023 04:19 PM   Modules accepted: Orders

## 2023-09-11 NOTE — Telephone Encounter (Signed)
 Pt wants to return to work. Can I change the note you wrote her yesterday to reflect NO RESTRICTIONS?

## 2023-09-11 NOTE — Telephone Encounter (Signed)
 Copied from CRM [id number]. Topic: General - Other >> Sep 11, 2023  1:34 PM Gennette ORN wrote: Reason for CRM: Patient is calling because she wants the note to be changed so she can go back to work tomorrow.

## 2023-09-11 NOTE — Addendum Note (Signed)
 Addended by: CORWIN ANTU on: 09/11/2023 04:01 PM   Modules accepted: Orders

## 2023-09-11 NOTE — Telephone Encounter (Signed)
 Called and spoke with patient and she spoke with LTD with her work and they need an update. She is not ready to return to work due to how she has been feeling, this is through [employer]. She can't even walk outside especially in this heat without her copd exacerbating.   We will extend this for one more month and re evaluate from there.   She is also requesting a referral for therapy. 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 She is taking prozac  20 mg and is open to increase.   I advised her I am placing a referral to therapy and also increasing her prozac  to 40 mg once daily.

## 2023-09-11 NOTE — Telephone Encounter (Signed)
 Called and spoke with the pt.   Pt is not currently going to be returning to work due to copd issues.

## 2023-09-11 NOTE — Telephone Encounter (Signed)
 NOTED

## 2023-09-11 NOTE — Telephone Encounter (Signed)
 Most recent call from pt is now suggesting she not return to work so we will leave this part alone for now.

## 2023-09-12 LAB — CELIAC DISEASE PANEL
(tTG) Ab, IgA: <1 U/mL
(tTG) Ab, IgG: <1 U/mL
Gliadin IgA: <1 U/mL
Gliadin IgG: <1 U/mL
Immunoglobulin A: 117 mg/dL (ref 47–310)

## 2023-09-13 ENCOUNTER — Telehealth (HOSPITAL_COMMUNITY): Payer: Self-pay

## 2023-09-13 NOTE — Telephone Encounter (Signed)
 Pt insurance is active and benefits verified through [facility]. Co-pay $0, DED $0/$0 met, out of pocket $3,500/$3,500 met, co-insurance 0%. No pre-authorization required. 09/13/2023 @ 12:22pm, spoke with Eleanor ORN., REF# Melissa [id number]. 09/13/2023.

## 2023-09-17 ENCOUNTER — Telehealth (HOSPITAL_COMMUNITY): Payer: Self-pay

## 2023-09-17 NOTE — Telephone Encounter (Signed)
 Called pt to see if she was interested in Pulmonary Rehab. Left VM with department number.

## 2023-09-19 ENCOUNTER — Telehealth (HOSPITAL_COMMUNITY): Payer: Self-pay

## 2023-09-21 ENCOUNTER — Ambulatory Visit: Admitting: Primary Care

## 2023-09-25 ENCOUNTER — Other Ambulatory Visit (INDEPENDENT_AMBULATORY_CARE_PROVIDER_SITE_OTHER): Payer: Self-pay | Admitting: Vascular Surgery

## 2023-09-25 DIAGNOSIS — I872 Venous insufficiency (chronic) (peripheral): Secondary | ICD-10-CM | POA: Insufficient documentation

## 2023-09-25 DIAGNOSIS — R2243 Localized swelling, mass and lump, lower limb, bilateral: Secondary | ICD-10-CM

## 2023-09-25 DIAGNOSIS — I890 Lymphedema, not elsewhere classified: Secondary | ICD-10-CM | POA: Insufficient documentation

## 2023-09-25 NOTE — Progress Notes (Unsigned)
 MRN : [id number]  [name] is a 48 y.o. (07-23-1975) female who presents with chief complaint of legs hurt and swell.  History of Present Illness:   The patient returns to the office for followup evaluation regarding leg swelling.  The swelling has persisted and the pain associated with swelling continues. There have not been any interval development of a ulcerations or wounds.  Since the previous visit the patient has been wearing graduated compression stockings and has noted little if any improvement in the lymphedema. The patient has been using compression routinely morning until night.  The patient also states elevation during the day and exercise is being done too.  Patient underwent bilateral lower extremity arterial ultrasounds with ABIs on 06/27/2023. These were essentially normal. Right lower extremities ABI was 1.11 and her left lower extremity ABI was 1.10 with triphasic normal waveforms throughout.   No outpatient medications have been marked as taking for the 09/27/23 encounter (Appointment) with Jama, Cordella MATSU, MD.   Current Facility-Administered Medications for the 09/27/23 encounter (Appointment) with Jama, Cordella MATSU, MD  Medication   ipratropium-albuterol  (DUONEB) 0.5-2.5 (3) MG/3ML nebulizer solution 3 mL    Past Medical History:  Diagnosis Date   Alcohol  abuse    Anemia 01/31/2011   Asthma    COPD (chronic obstructive pulmonary disease) (HCC)    Depression    Hepatitis C 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 10/24/2021   RNA negative.  Hep C false positive.     Hypoxia 02/01/2011   Medical history non-contributory    Needs sleep apnea assessment 2013.07.31   Tobacco abuse    Vertigo 04/2023    Past Surgical History:  Procedure Laterality Date   ANKLE GANGLION CYST EXCISION     BACK SURGERY     CARPAL TUNNEL RELEASE Right    CESAREAN SECTION     COLONOSCOPY WITH PROPOFOL  N/A 06/27/2016   Procedure: COLONOSCOPY WITH PROPOFOL ;  Surgeon: Therisa Bi, MD;  Location: [facility] ENDOSCOPY;  Service: Endoscopy;  Laterality: N/A;   DILATION AND CURETTAGE OF UTERUS     ESOPHAGOGASTRODUODENOSCOPY (EGD) WITH PROPOFOL  N/A 06/27/2016   Procedure: ESOPHAGOGASTRODUODENOSCOPY (EGD) WITH PROPOFOL ;  Surgeon: Therisa Bi, MD;  Location: [facility] ENDOSCOPY;  Service: Endoscopy;  Laterality: N/A;    Social History Social History   Tobacco Use   Smoking status: Every Day    Current packs/day: 0.00    Average packs/day: 0.5 packs/day for 25.0 years (12.5 ttl pk-yrs)    Types: Cigarettes    Start date: 11/11/1996    Last attempt to quit: 11/11/2021    Years since quitting: 1.8    Passive exposure: Current   Smokeless tobacco: Never   Tobacco comments:    Pt smokes 0.5 ppd. AB, CMA 09-07-23             1 pack a day. Trying to quit started using chantix .  05/11/2023  Vaping Use   Vaping status: Former  Substance Use Topics   Alcohol  use: Yes    Comment: occasionally   Drug use: Not Currently    Types: Marijuana, Cocaine    Comment: last >2 y/o    Family History Family History  Problem Relation Age of Onset   Stroke Mother    Hypothyroidism Mother    Coronary artery disease Mother    Throat cancer Mother    Hypertension Brother    Stroke Maternal Grandmother    Diabetes Maternal Grandmother    COPD Paternal Grandmother  No Known Allergies   REVIEW OF SYSTEMS (Negative unless checked)  Constitutional: [] Weight loss  [] Fever  [] Chills Cardiac: [] Chest pain   [] Chest pressure   [] Palpitations   [] Shortness of breath when laying flat   [] Shortness of breath with exertion. Vascular:  [] Pain in legs with walking   [x] Pain in legs at rest  [] History of DVT   [] Phlebitis   [x] Swelling in legs   [] Varicose veins   [] Non-healing ulcers Pulmonary:   [] Uses home oxygen    [] Productive cough   [] Hemoptysis   [] Wheeze  [x] COPD   [] Asthma Neurologic:  [] Dizziness   [] Seizures   [] History of stroke   [] History of TIA  [] Aphasia   [] Vissual changes    [] Weakness or numbness in arm   [] Weakness or numbness in leg Musculoskeletal:   [] Joint swelling   [x] Joint pain   [] Low back pain Hematologic:  [] Easy bruising  [] Easy bleeding   [] Hypercoagulable state   [] Anemic Gastrointestinal:  [] Diarrhea   [] Vomiting  [x] Gastroesophageal reflux/heartburn   [] Difficulty swallowing. Genitourinary:  [] Chronic kidney disease   [] Difficult urination  [] Frequent urination   [] Blood in urine Skin:  [] Rashes   [] Ulcers  Psychological:  [] History of anxiety   []  History of major depression.  Physical Examination  There were no vitals filed for this visit. There is no height or weight on file to calculate BMI. Gen: WD/WN, NAD Head: [place]/AT, No temporalis wasting.  Ear/Nose/Throat: Hearing grossly intact, nares w/o erythema or drainage, pinna without lesions Eyes: PER, EOMI, sclera nonicteric.  Neck: Supple, no gross masses.  No JVD.  Pulmonary:  Good air movement, no audible wheezing, no use of accessory muscles.  Cardiac: RRR, precordium not hyperdynamic. Vascular:  scattered varicosities present bilaterally.  Moderate venous stasis changes to the legs bilaterally.  2+ soft pitting edema. CEAP [id number]   Vessel Right Left  Radial Palpable Palpable  Gastrointestinal: soft, non-distended. No guarding/no peritoneal signs.  Musculoskeletal: M/S 5/5 throughout.  No deformity.  Neurologic: CN 2-12 intact. Pain and light touch intact in extremities.  Symmetrical.  Speech is fluent. Motor exam as listed above. Psychiatric: Judgment intact, Mood & affect appropriate for pt's clinical situation. Dermatologic: Venous rashes no ulcers noted.  No changes consistent with cellulitis. Lymph : No lichenification or skin changes of chronic lymphedema.  CBC Lab Results  Component Value Date   WBC 6.2 07/06/2023   HGB 11.8 (L) 07/06/2023   HCT 37.0 07/06/2023   MCV 82.6 07/06/2023   PLT 270.0 07/06/2023    BMET    Component Value Date/Time   NA 140 07/06/2023  0917   NA 136 05/11/2013 0341   K 4.3 07/06/2023 0917   K 4.1 05/11/2013 0341   CL 104 07/06/2023 0917   CL 106 05/11/2013 0341   CO2 29 07/06/2023 0917   CO2 27 05/11/2013 0341   GLUCOSE 88 07/06/2023 0917   GLUCOSE 143 (H) 05/11/2013 0341   BUN 15 07/06/2023 0917   BUN 12 05/11/2013 0341   CREATININE 0.68 07/06/2023 0917   CREATININE 0.71 05/15/2013 0629   CALCIUM 9.4 07/06/2023 0917   CALCIUM 8.7 05/11/2013 0341   GFRNONAA >60 05/07/2023 1125   GFRNONAA >60 05/15/2013 0629   GFRAA >60 10/25/2019 0739   GFRAA >60 05/15/2013 0629   CrCl cannot be calculated (Patient's most recent lab result is older than the maximum 21 days allowed.).  COAG Lab Results  Component Value Date   INR 1.0 10/25/2019    Radiology No results found.  Assessment/Plan There are no diagnoses linked to this encounter.   Cordella Shawl, MD  09/25/2023 12:26 PM

## 2023-09-26 ENCOUNTER — Telehealth (HOSPITAL_COMMUNITY): Payer: Self-pay

## 2023-09-26 NOTE — Telephone Encounter (Signed)
 Received message that [facility] called. Returned call and LVM with department number.

## 2023-09-27 ENCOUNTER — Ambulatory Visit (INDEPENDENT_AMBULATORY_CARE_PROVIDER_SITE_OTHER): Admitting: Vascular Surgery

## 2023-09-27 ENCOUNTER — Encounter (INDEPENDENT_AMBULATORY_CARE_PROVIDER_SITE_OTHER): Payer: Self-pay | Admitting: Vascular Surgery

## 2023-09-27 ENCOUNTER — Ambulatory Visit (INDEPENDENT_AMBULATORY_CARE_PROVIDER_SITE_OTHER)

## 2023-09-27 VITALS — BP 113/77 | HR 65 | Resp 18 | Ht 64.0 in | Wt 200.0 lb

## 2023-09-27 DIAGNOSIS — I872 Venous insufficiency (chronic) (peripheral): Secondary | ICD-10-CM

## 2023-09-27 DIAGNOSIS — I890 Lymphedema, not elsewhere classified: Secondary | ICD-10-CM | POA: Diagnosis not present

## 2023-09-27 DIAGNOSIS — K219 Gastro-esophageal reflux disease without esophagitis: Secondary | ICD-10-CM | POA: Diagnosis not present

## 2023-09-27 DIAGNOSIS — R2243 Localized swelling, mass and lump, lower limb, bilateral: Secondary | ICD-10-CM

## 2023-09-27 DIAGNOSIS — J449 Chronic obstructive pulmonary disease, unspecified: Secondary | ICD-10-CM | POA: Diagnosis not present

## 2023-10-02 ENCOUNTER — Encounter (INDEPENDENT_AMBULATORY_CARE_PROVIDER_SITE_OTHER): Payer: Self-pay | Admitting: Vascular Surgery

## 2023-10-04 NOTE — Telephone Encounter (Signed)
 Received Attending physicians statement form to be completed and all medical records from 6.1.25-present.  Placed in providers box for review.

## 2023-10-05 ENCOUNTER — Encounter: Payer: Self-pay | Admitting: Family

## 2023-10-05 DIAGNOSIS — Z5689 Other problems related to employment: Secondary | ICD-10-CM | POA: Insufficient documentation

## 2023-10-05 DIAGNOSIS — R5381 Other malaise: Secondary | ICD-10-CM | POA: Insufficient documentation

## 2023-10-05 NOTE — Telephone Encounter (Signed)
 Completed forms with dx list and med list received and faxed to [employer] at [phone]  Copy sent to scan  Copy left at the front desk for the patient to pick up.

## 2023-10-05 NOTE — Telephone Encounter (Signed)
 Completed Manuelita can you please print out med list and dx list.  Make sure icd 10 codes are on the dx code list please

## 2023-10-08 NOTE — Telephone Encounter (Signed)
 NOTED

## 2023-10-29 ENCOUNTER — Other Ambulatory Visit: Payer: Self-pay

## 2023-10-29 ENCOUNTER — Ambulatory Visit: Admitting: Family

## 2023-10-29 ENCOUNTER — Encounter: Payer: Self-pay | Admitting: Family

## 2023-10-29 ENCOUNTER — Telehealth: Payer: Self-pay | Admitting: Family

## 2023-10-29 ENCOUNTER — Other Ambulatory Visit (HOSPITAL_COMMUNITY): Payer: Self-pay

## 2023-10-29 ENCOUNTER — Telehealth: Payer: Self-pay

## 2023-10-29 VITALS — BP 124/80 | HR 80 | Temp 98.5°F | Wt 200.0 lb

## 2023-10-29 DIAGNOSIS — D649 Anemia, unspecified: Secondary | ICD-10-CM | POA: Diagnosis not present

## 2023-10-29 DIAGNOSIS — E039 Hypothyroidism, unspecified: Secondary | ICD-10-CM | POA: Diagnosis not present

## 2023-10-29 DIAGNOSIS — J449 Chronic obstructive pulmonary disease, unspecified: Secondary | ICD-10-CM | POA: Diagnosis not present

## 2023-10-29 DIAGNOSIS — G4733 Obstructive sleep apnea (adult) (pediatric): Secondary | ICD-10-CM | POA: Diagnosis not present

## 2023-10-29 DIAGNOSIS — R059 Cough, unspecified: Secondary | ICD-10-CM

## 2023-10-29 LAB — CBC WITH DIFFERENTIAL/PLATELET
Basophils Absolute: 0.1 K/uL (ref 0.0–0.1)
Basophils Relative: 0.8 % (ref 0.0–3.0)
Eosinophils Absolute: 0.3 K/uL (ref 0.0–0.7)
Eosinophils Relative: 4.3 % (ref 0.0–5.0)
HCT: 38.8 % (ref 36.0–46.0)
Hemoglobin: 12.3 g/dL (ref 12.0–15.0)
Lymphocytes Relative: 27.7 % (ref 12.0–46.0)
Lymphs Abs: 1.8 K/uL (ref 0.7–4.0)
MCHC: 31.6 g/dL (ref 30.0–36.0)
MCV: 84 fl (ref 78.0–100.0)
Monocytes Absolute: 0.5 K/uL (ref 0.1–1.0)
Monocytes Relative: 8.2 % (ref 3.0–12.0)
Neutro Abs: 3.8 K/uL (ref 1.4–7.7)
Neutrophils Relative %: 59 % (ref 43.0–77.0)
Platelets: 319 K/uL (ref 150.0–400.0)
RBC: 4.62 Mil/uL (ref 3.87–5.11)
RDW: 15.7 % — ABNORMAL HIGH (ref 11.5–15.5)
WBC: 6.5 K/uL (ref 4.0–10.5)

## 2023-10-29 LAB — IBC + FERRITIN
Ferritin: 5 ng/mL — ABNORMAL LOW (ref 10.0–291.0)
Iron: 69 ug/dL (ref 42–145)
Saturation Ratios: 12.9 % — ABNORMAL LOW (ref 20.0–50.0)
TIBC: 534.8 ug/dL — ABNORMAL HIGH (ref 250.0–450.0)
Transferrin: 382 mg/dL — ABNORMAL HIGH (ref 212.0–360.0)

## 2023-10-29 LAB — TSH: TSH: 8.98 u[IU]/mL — ABNORMAL HIGH (ref 0.35–5.50)

## 2023-10-29 MED ORDER — METHYLPREDNISOLONE ACETATE 80 MG/ML IJ SUSP: 80.0000 mg | Freq: Once | INTRAMUSCULAR | Status: DC

## 2023-10-29 MED ORDER — BREZTRI AEROSPHERE 160-9-4.8 MCG/ACT IN AERO: 2.0000 | INHALATION_SPRAY | Freq: Two times a day (BID) | RESPIRATORY_TRACT | Status: DC

## 2023-10-29 MED ORDER — BREZTRI AEROSPHERE 160-9-4.8 MCG/ACT IN AERO: 2.0000 | INHALATION_SPRAY | Freq: Two times a day (BID) | RESPIRATORY_TRACT | 11 refills | Status: AC

## 2023-10-29 MED ORDER — ALPRAZOLAM 0.25 MG PO TABS: 0.2500 mg | ORAL_TABLET | Freq: Two times a day (BID) | ORAL | Status: AC | PRN

## 2023-10-29 MED ORDER — ALBUTEROL SULFATE HFA 108 (90 BASE) MCG/ACT IN AERS: 2.0000 | INHALATION_SPRAY | RESPIRATORY_TRACT | 2 refills | Status: DC | PRN

## 2023-10-29 NOTE — Telephone Encounter (Signed)
 Treatment failure with singulair , spiriva , symbicort , advair and trelegy  Can we start prior auth for breztri 

## 2023-10-29 NOTE — Progress Notes (Signed)
 Per secure chat with Almarie Ferrari, NP and Ginger Patrick, FNP  Pt was not tolerating Trelegy as it was stated she was gagging and vomiting. Per Almarie Ferrari, NP - she has a virtual appt scheduled on Thursday 11-01-23 and I am placing 2 samples of Breztriat  our front office for pt to pick up. Pt was notified of appt and samples. NFN

## 2023-10-29 NOTE — Telephone Encounter (Signed)
 Pharmacy Patient Advocate Encounter   Received notification from RX Request Messages that prior authorization for Breztri  is required/requested.   Insurance verification completed.   The patient is insured through [facility] .   Per test claim: The current 30 day co-pay is, $35.00.  No PA needed at this time. This test claim was processed through [facility]- copay amounts may vary at other pharmacies due to pharmacy/plan contracts, or as the patient moves through the different stages of their insurance plan.     Spoke with [facility], Breztri  is being ordered for patient with $35.00 copay. They are also filling a Ventolin  inhaler for her as well.

## 2023-10-29 NOTE — Progress Notes (Unsigned)
 Established Patient Office Visit  Subjective:      CC:  Chief Complaint  Patient presents with   Medical Management of Chronic Issues    HPI: [name] is a 48 y.o. female presenting on 10/29/2023 for Medical Management of Chronic Issues .  Discussed the use of AI scribe software for clinical note transcription with the patient, who gave verbal consent to proceed.  History of Present Illness [name] is a 48 year old female who presents for medication management and disability paperwork.  She experiences difficulty with her current inhaler, Trelegy, as it causes gagging and nausea. She has tried other inhalers such as Spiriva , Symbicort , Advair, and Breztri , but faced issues with insurance coverage or treatment failure. She uses a nebulizer a couple of times a day and is awaiting Medicaid approval for a CPAP machine due to a $500 copay requirement. She reports that her sleep study showed she quit breathing 11 times in an hour and was told this was 'medium' or 'moderate' severity.  She has hypothyroidism managed with Synthroid , although she missed four days of medication while out of town. She was previously switched to Tyrosine, but insurance did not cover it. She is due for a repeat thyroid  function test.  She experiences shortness of breath and uses oxygen  more frequently. Anxiety is exacerbated by being out of work and financial stress. She is applying for disability and Medicaid due to her multiple health conditions.  Her current medications include Astelin  nasal spray, meloxicam  7.5 mg twice daily, Percocet as needed, Prozac  40 mg (20 mg in the morning and 20 mg in the afternoon), and over-the-counter iron supplements taken every other day. She also uses albuterol  and alprazolam  as needed.  She has a history of low iron levels and takes iron supplements inconsistently. She has not seen a hematologist for this issue.         Social  history:  Relevant past medical, surgical, family and social history reviewed and updated as indicated. Interim medical history since our last visit reviewed.  Allergies and medications reviewed and updated.  DATA REVIEWED: CHART IN EPIC     ROS: Negative unless specifically indicated above in HPI.    Current Outpatient Medications:    azelastine  (ASTELIN ) 0.1 % nasal spray, Place 1 spray into both nostrils 2 (two) times daily. Use in each nostril as directed, Disp: 30 mL, Rfl: 1   budesonide  (PULMICORT ) 0.5 MG/2ML nebulizer solution, USE 1 VIAL IN NEBULIZER TWICE DAILY AS NEEDED FOR WHEEZING, Disp: 60 mL, Rfl: 0   budesonide -glycopyrrolate-formoterol  (BREZTRI  AEROSPHERE) 160-9-4.8 MCG/ACT AERO inhaler, Inhale 2 puffs into the lungs 2 (two) times daily., Disp: 10.7 g, Rfl: 11   cyclobenzaprine  (FLEXERIL ) 10 MG tablet, Take 1 tablet (10 mg total) by mouth 3 (three) times daily as needed for muscle spasms., Disp: 30 tablet, Rfl: 0   FLUoxetine  (PROZAC ) 40 MG capsule, Take 1 capsule (40 mg total) by mouth daily., Disp: 90 capsule, Rfl: 3   gabapentin  (NEURONTIN ) 300 MG capsule, TAKE 1 CAPSULE BY MOUTH ONCE DAILY AS NEEDED FOR PAIN, Disp: 90 capsule, Rfl: 0   meclizine  (ANTIVERT ) 25 MG tablet, TAKE 1/2 TO 1 (ONE-HALF TO ONE) TABLET BY MOUTH ONCE DAILY AS NEEDED FOR VERTIGO, Disp: 30 tablet, Rfl: 0   meloxicam  (MOBIC ) 7.5 MG tablet, Take 1 tablet (7.5 mg total) by mouth 2 (two) times daily., Disp: 180 tablet, Rfl: 0   Nebulizer System All-In-One MISC, 1 Device by Does not apply route  daily as needed., Disp: 1 each, Rfl: 0   oxyCODONE -acetaminophen  (PERCOCET) 10-325 MG tablet, Take 1 tablet by mouth 4 (four) times daily as needed., Disp: , Rfl:    roflumilast  (DALIRESP ) 500 MCG TABS tablet, Take 1 tablet (500 mcg total) by mouth daily., Disp: 30 tablet, Rfl: 11   SYNTHROID  200 MCG tablet, Take one tablet 200 mcg along with one 25 mcg tablet once daily (total 225 mcg once daily), Disp: 30 tablet,  Rfl: 1   SYNTHROID  25 MCG tablet, Take 1 25 mcg tablet along with one 200 mcg tablet once daily for total of 225 mcg once daily, Disp: 30 tablet, Rfl: 1   albuterol  (VENTOLIN  HFA) 108 (90 Base) MCG/ACT inhaler, Inhale 2 puffs into the lungs every 4 (four) hours as needed., Disp: 18 g, Rfl: 2   ALPRAZolam  (XANAX ) 0.25 MG tablet, Take 1 tablet (0.25 mg total) by mouth 2 (two) times daily as needed for anxiety or sleep., Disp: , Rfl:    budesonide -glycopyrrolate-formoterol  (BREZTRI  AEROSPHERE) 160-9-4.8 MCG/ACT AERO inhaler, Inhale 2 puffs into the lungs in the morning and at bedtime., Disp: , Rfl:   Current Facility-Administered Medications:    ipratropium-albuterol  (DUONEB) 0.5-2.5 (3) MG/3ML nebulizer solution 3 mL, 3 mL, Nebulization, Q6H, Kari Almarie ORN, NP, 3 mL at 11/17/21 1357        Objective:        BP 124/80 (BP Location: Left Arm, Patient Position: Sitting, Cuff Size: Normal)   Pulse 80   Temp 98.5 F (36.9 C) (Temporal)   Wt 200 lb (90.7 kg)   LMP 08/22/2022 (Approximate)   SpO2 98%   BMI 34.33 kg/m   Physical Exam   Wt Readings from Last 3 Encounters:  10/29/23 200 lb (90.7 kg)  09/27/23 200 lb (90.7 kg)  09/07/23 205 lb (93 kg)    Physical Exam Constitutional:      General: She is not in acute distress.    Appearance: Normal appearance. She is normal weight. She is not ill-appearing, toxic-appearing or diaphoretic.  HENT:     Mouth/Throat:     Pharynx: No oropharyngeal exudate or posterior oropharyngeal erythema.  Cardiovascular:     Rate and Rhythm: Normal rate and regular rhythm.     Pulses: Normal pulses.     Heart sounds: Normal heart sounds.  Pulmonary:     Effort: Pulmonary effort is normal.     Breath sounds: Examination of the right-upper field reveals wheezing. Examination of the left-upper field reveals wheezing. Examination of the right-middle field reveals wheezing. Examination of the left-middle field reveals wheezing. Examination of the  right-lower field reveals wheezing. Examination of the left-lower field reveals wheezing. Wheezing present.  Musculoskeletal:     Cervical back: Normal range of motion.  Neurological:     General: No focal deficit present.     Mental Status: She is alert and oriented to person, place, and time. Mental status is at baseline.  Psychiatric:        Mood and Affect: Mood normal.        Behavior: Behavior normal.        Thought Content: Thought content normal.        Judgment: Judgment normal.          Results DIAGNOSTIC Sleep study: Apnea with 11 episodes of breathing cessation per hour, moderate severity.  Assessment & Plan:   Assessment and Plan Assessment & Plan Chronic obstructive pulmonary disease (COPD) with inhaler intolerance and treatment failure Moderate to severe COPD  with intolerance to Trelegy inhaler, causing gagging and sickness. Previous treatment failures with Spiriva , Symbicort , Advair, and Trelegy. Insurance issues with Breztri  coverage. She has been using a nebulizer a couple of times a day and requires albuterol  refills. - Initiate prior authorization for Breztri  inhaler. - Refill albuterol  inhaler. - Continue nebulizer use as needed.  Hypothyroidism Hypothyroidism managed with Synthroid . Recent lapse in medication adherence due to forgetting Synthroid  for four days while out of town. Thyroid  levels previously improving. She is due for a repeat TSH test. - Order TSH test to assess current thyroid  function. - Refill Synthroid  prescription after TSH results.  Obstructive sleep apnea Moderate obstructive sleep apnea with 11 episodes of apnea per hour. Awaiting Medicaid approval to obtain CPAP due to high copay with current insurance. CPAP is expected to make a significant difference in her condition. - Await Medicaid approval for CPAP acquisition.  Anemia Chronic low iron levels. No current hematology follow-up. Iron supplementation is not taken daily but is  taken consistently every other day. - Continue over-the-counter iron supplementation.  Anxiety disorder Anxiety exacerbated by current life stressors including unemployment and financial concerns. Managed with alprazolam  as needed. She is also on Prozac  40 mg daily, taken as 20 mg in the morning and 20 mg in the afternoon. - Refill alprazolam  prescription.  Recording duration: 15 minutes      Return in about 3 months (around 01/28/2024) for follow up LTD .     Ginger Patrick, MSN, APRN, FNP-C [facility]

## 2023-10-30 ENCOUNTER — Ambulatory Visit: Payer: Self-pay | Admitting: Family

## 2023-10-30 DIAGNOSIS — E039 Hypothyroidism, unspecified: Secondary | ICD-10-CM

## 2023-10-30 NOTE — Telephone Encounter (Signed)
 NOTED Great news.   [name], FYI approved!

## 2023-10-30 NOTE — Telephone Encounter (Signed)
 Amazing, thanks [employer]

## 2023-10-31 ENCOUNTER — Telehealth: Payer: Self-pay

## 2023-10-31 ENCOUNTER — Ambulatory Visit (INDEPENDENT_AMBULATORY_CARE_PROVIDER_SITE_OTHER)

## 2023-10-31 DIAGNOSIS — E039 Hypothyroidism, unspecified: Secondary | ICD-10-CM

## 2023-10-31 LAB — T3, FREE: T3, Free: 5.8 pg/mL — ABNORMAL HIGH (ref 2.3–4.2)

## 2023-10-31 LAB — T4, FREE: Free T4: 1.11 ng/dL (ref 0.60–1.60)

## 2023-10-31 NOTE — Telephone Encounter (Signed)
 I called spoke to [facility] with [employer]. Arvella states they have a voided order due to the pt not filling out hardship paperwork. I tried calling pt to see if she was able to drop off her SD card inside her machine. I was unable to reach her, [email or web address]

## 2023-11-01 ENCOUNTER — Ambulatory Visit: Payer: Self-pay | Admitting: Family

## 2023-11-01 ENCOUNTER — Telehealth: Payer: Self-pay | Admitting: Primary Care

## 2023-11-01 ENCOUNTER — Ambulatory Visit: Admitting: Primary Care

## 2023-11-01 ENCOUNTER — Telehealth: Admitting: Primary Care

## 2023-11-01 ENCOUNTER — Telehealth (HOSPITAL_COMMUNITY): Payer: Self-pay

## 2023-11-01 ENCOUNTER — Encounter (HOSPITAL_COMMUNITY): Payer: Self-pay

## 2023-11-01 DIAGNOSIS — J449 Chronic obstructive pulmonary disease, unspecified: Secondary | ICD-10-CM

## 2023-11-01 DIAGNOSIS — F17210 Nicotine dependence, cigarettes, uncomplicated: Secondary | ICD-10-CM | POA: Diagnosis not present

## 2023-11-01 DIAGNOSIS — G4734 Idiopathic sleep related nonobstructive alveolar hypoventilation: Secondary | ICD-10-CM

## 2023-11-01 DIAGNOSIS — G4733 Obstructive sleep apnea (adult) (pediatric): Secondary | ICD-10-CM | POA: Diagnosis not present

## 2023-11-01 MED ORDER — PREDNISONE 10 MG PO TABS
ORAL_TABLET | ORAL | 0 refills | Status: AC
Start: 2023-11-01 — End: 2023-11-13

## 2023-11-01 MED ORDER — BUDESONIDE 0.5 MG/2ML IN SUSP: 0.5000 mg | Freq: Two times a day (BID) | RESPIRATORY_TRACT | 0 refills | Status: DC | PRN

## 2023-11-01 NOTE — Telephone Encounter (Signed)
 I am going to see this patient in office in 2-4 weeks to discuss starting biologic for COPD/Asthma with recurrent exacerbations. Eos in May were 400. Considering either Dupixent or Tezspire. Can we look into coverage or would I just need to start paperwork for preferred biologic.

## 2023-11-01 NOTE — Telephone Encounter (Signed)
 Called pt 2nd time to try and schedule for [facility]. Could not leave a voicemail. Will send out letter

## 2023-11-01 NOTE — Progress Notes (Signed)
 Virtual Visit via Video Note  I connected with [name] on 11/01/23 at 11:30 AM EDT by a video enabled telemedicine application and verified that I am speaking with the correct person using two identifiers.  Location: Patient: Home Provider: Office    I discussed the limitations of evaluation and management by telemedicine and the availability of in person appointments. The patient expressed understanding and agreed to proceed.  History of Present Illness: 48 year old female, current everyday smoker (25 pack year hx). PMH significant for COPD GOLD II, CAP, acute respiratory failure, hypothyroidism, alcohol  dependence, major depression, anemia, tobacco abuse. Patient of Dr. Darlean.  Previous [facility]  07/17/2023 Discussed the use of AI scribe software for clinical note transcription with the patient, who gave verbal consent to proceed.  History of Present Illness   [name] is a 48 year old female with moderate to severe COPD who presents with persistent shortness of breath and fatigue.  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 Her energy levels deplete quickly, often within minutes of starting an activity. Symptoms are manageable when sitting still but worsen with exertion. These issues persist despite previous evaluations and treatments.  She has moderate to severe COPD, with her last pulmonary function test in 2022 showing lung function at 54%, indicating stage 2 to 3 COPD. She continues to smoke and has started using Chantix  to aid in smoking cessation. Her current medications include Symbicort  and Spiriva  inhalers, but her insurance does not cover Breztri . She has not tried Trelegy before. She uses a rescue inhaler, albuterol , and a nebulizer with budesonide  or Pulmicort  up to twice a day as needed for wheezing.  CT chest in March showed borderline cardiomegaly.   Interval decrease in size of enlarged main pulmonary artery.  Interval improvement in bilateral atelectasis/scarring.  Resolved pneumonia right lower lobe. A recent chest x-ray in May showed no acute cardiopulmonary disease. An echocardiogram in May showed an ejection fraction of 55 to 60%.  Home sleep study on 05/21/2023 showed mild obstructive sleep apnea, AHI 4% 11.8/hour.  SpO2 low 83%.  Patient spent 48 minutes with an oxygen  level less than 88%.  Recommend patient be started on auto CPAP 5 to 15 cm H2O.   She has been on prednisone  previously, which significantly improves her symptoms, although the effects are temporary. She has not been on antibiotics since March.  She is concerned about her ability to return to work due to her current health status and is worried about losing her insurance coverage. She is currently on short-term disability.      Chronic Obstructive Pulmonary Disease (COPD) Moderate to severe COPD with a 7% decline in lung function over the past three years. No exacerbations requiring antibiotics or hospitalization since June. Trelegy is effectively replacing Symbicort  and Spiriva . Daliresp  (Roflumilast ) initiated at 250 mcg, with plans to increase to 500 mcg. Awaiting contact from pulmonary rehab. - Continue Trelegy 200mcg  - Use budesonide  nebulizer as needed for wheezing, up to twice daily. - Use ipratropium albuterol  for dyspnea or chest tightness, up to four times daily. - Increase Daliresp  to 500 mcg to reduce COPD exacerbations. - Ensure pulmonary rehab contacts her. - Discussed smoking cessation as a priority. - Consider biologic therapy with Nucala for recurrent exacerbations if needed in the future   Tobacco Use Disorder Continues smoking despite previous cessation attempts with gum, Chantix , and the patch. Smoking cessation is crucial to  prevent further lung function decline. Stress and caregiving responsibilities hinder quitting. - Recommend 'Easy Ways to Quit  Smoking' by Dasie Myron. - Emphasize the importance of smoking cessation to prevent further lung function decline.  Sleep Apnea Sleep apnea. CPAP therapy recommended but cost is a barrier. Frequent nocturnal awakenings, which CPAP may alleviate. - Apply for CPAP assistance through [email or web address]. - Reassess in three months to evaluate CPAP use and effectiveness.  11/01/2023- interim hx  Discussed the use of AI scribe software for clinical note transcription with the patient, who gave verbal consent to proceed.  History of Present Illness [name] is a 48 year old female with COPD and sleep apnea who presents for follow-up on her respiratory management. She was referred by her primary care provider for management of her respiratory conditions.  She has been experiencing issues with her breathing, primarily related to her COPD. Symptoms include wheezing and a small amount of yellow mucus production, though not as severe as during previous exacerbations. She has had approximately six to seven exacerbations requiring prednisone  since January, which provides the best relief for her breathing. She has a spacer for her inhaler. She is currently taking Daliresp  at 500 micrograms.  She has sleep apnea and has not yet received a CPAP machine due to financial constraints, including a $500 copayment and lack of hardship paperwork. She has applied for Medicaid to help with insurance coverage. She is exploring alternative options for obtaining a CPAP machine, such as second-hand sources.  In terms of medication, she is currently taking Breztri  and Daliresp . She has not been taking Mucinex  recently but did use it in July.  No significant chest tightness or severe mucus production as seen in previous flare-ups.   Observations/Objective:  Appears well, able to speak in full sentences  Assessment and Plan:   1. 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, still smoking  (Primary)  2. Hypoxia, sleep related  3. Moderate obstructive sleep apnea  Assessment and Plan Assessment & Plan Chronic obstructive pulmonary disease (COPD) with frequent exacerbations COPD with frequent exacerbations, approximately six to seven flare-ups requiring prednisone  since January. Current symptoms include wheezing and mild yellow sputum production. Breztri  has been initiated to replace Trelegy as the maintenance inhaler. Consideration of Nucala, a biologic injection, due to elevated eosinophils and frequent exacerbations. Nucala is indicated for patients with more than two or three flare-ups or one hospitalization per year. She responds well to prednisone , indicating potential benefit from [place]. - Instruct to use Breztri  twice daily, replacing Trelegy. - Advise to use Pulmicort  as needed for wheezing. - Continue Daliresp  at 500 micrograms daily. - Prescribe a prednisone  taper for current exacerbation. - Recommend Robitussin or Mucinex  for 5-7 days to loosen chest congestion. - Schedule follow-up appointment in two weeks to discuss potential initiation of Dupixent, Nucala or Tezspire   Obstructive sleep apnea Obstructive sleep apnea with difficulty obtaining CPAP due to financial constraints. She has not received the CPAP machine due to a $500 copayment and lack of hardship paperwork. Medicaid application is in process, which may provide better coverage for CPAP therapy. Alternative options include applying for patient assistance and exploring second-hand CPAP machines. - Provide information on applying for patient assistance for CPAP through [email or web address]. - Advise to apply for Medicaid and notify if coverage is obtained to resubmit CPAP order. - Suggest exploring second-hand CPAP machines from family or online sources. - Recommend side sleeping position or use of a wedge pillow to reduce apneic events. -Continue to  wear supplemental oxygen  at night while sleeping   Follow Up  Instructions:  2-4 weeks in person with Beth to discuss starting biologic such as Dupixent or Tezspire    I discussed the assessment and treatment plan with the patient. The patient was provided an opportunity to ask questions and all were answered. The patient agreed with the plan and demonstrated an understanding of the instructions.   The patient was advised to call back or seek an in-person evaluation if the symptoms worsen or if the condition fails to improve as anticipated.  I provided 35 minutes of non-face-to-face time during this encounter.   Kari LELON Ferrari, NP

## 2023-11-01 NOTE — Patient Instructions (Addendum)
  VISIT SUMMARY: Today, you were seen for a follow-up on your respiratory conditions, including COPD and sleep apnea. We discussed your recent breathing issues, medication regimen, and challenges in obtaining a CPAP machine. Adjustments were made to your treatment plan to better manage your symptoms.  YOUR PLAN: -CHRONIC OBSTRUCTIVE PULMONARY DISEASE (COPD) WITH FREQUENT EXACERBATIONS: COPD is a chronic lung condition that makes it hard to breathe. You have had frequent flare-ups requiring prednisone . We have switched your maintenance inhaler to Breztri , which you should use twice daily. Continue taking Daliresp  at 500 micrograms daily. Use Pulmicort  as needed for wheezing and take a prednisone  taper for your current exacerbation. You can also use Robitussin or Mucinex  for 5-7 days to help with chest congestion. We will discuss the potential initiation of Nucala, a biologic injection, at your follow-up appointment in two weeks. Please review your insurance benefits for Nucala with the pharmacy team.  -OBSTRUCTIVE SLEEP APNEA: Obstructive sleep apnea is a condition where your breathing stops and starts during sleep. You have had difficulty obtaining a CPAP machine due to financial constraints. We recommend applying for patient assistance through sleephealth.[email or web address] and continuing your Medicaid application. You can also explore second-hand CPAP machines from family or online sources. In the meantime, try sleeping on your side or using a wedge pillow to reduce apneic events.   We can mail you information about CPAP patient assistance, there is a wait list but you should apply   [email or web address]  Follow-up 2-4 weeks in person with Beth to discuss starting biologic such as Dupixent or Tezspire

## 2023-11-02 NOTE — Telephone Encounter (Signed)
 Reason for CRM: Patient requesting a call back from [name]. Patient states she never received a cpap machine or any hardship paperwork but really needs to get this resolved so that she can obtain the CPAP.

## 2023-11-04 ENCOUNTER — Telehealth: Payer: Self-pay | Admitting: Primary Care

## 2023-11-04 NOTE — Telephone Encounter (Signed)
 Needs visit with me in 2 weeks in person to discuss starting Dupixent- if nothing open can double book but please message

## 2023-11-04 NOTE — Telephone Encounter (Signed)
 Thank you, I will see her for visit in 2 weeks and start papework after speaking with her

## 2023-11-05 NOTE — Telephone Encounter (Signed)
 Patient is returning call she received to schedule a appointment in 2 weeks with provider Almarie Ferrari

## 2023-11-05 NOTE — Telephone Encounter (Signed)
 Transferred patient to [name] to get scheduled didn't see anything on my end for 2 weeks

## 2023-11-05 NOTE — Telephone Encounter (Signed)
 Called PT no answer Left VM. 11/05/23

## 2023-11-12 DIAGNOSIS — G4733 Obstructive sleep apnea (adult) (pediatric): Secondary | ICD-10-CM

## 2023-11-13 NOTE — Telephone Encounter (Signed)
 Manuelita, I signed the form.  Check tomorrow for it in my outbox please and if you could print a diagnosis list for attachment that'd be great. thanks

## 2023-11-19 ENCOUNTER — Telehealth: Payer: Self-pay

## 2023-11-19 NOTE — Telephone Encounter (Signed)
 She was approved for Medicaid, it started on the first.  She contacted [place] and they could see that her Medicaid was in affect.  She was told that she would be contacted about getting her CPAP machine.  She asked if her appointment for tomorrow was still good, she said it was not for f/u after starting CPAP, she said it was to discuss the shot for her COPD.  I advised that the appointment was still in place.  She said she is no longer on the Trelegy, she is on Breztri  and it is working well.  Nothing further needed.

## 2023-11-19 NOTE — Telephone Encounter (Signed)
 I was unable to find pt in airview. I called [address] and spoke to [place], [place]. Apolinar states the order was ever processed as the pt never met her deductible and therefore it was a financial issue. Apolinar states the pt ever received a machine from them nor did they receive an order for supplies on 11-15-23.   ATC X1. LMTCB

## 2023-11-19 NOTE — Telephone Encounter (Signed)
 Patient is returning call from nurse [name] . Calling cal now to see if she is available . Spoke with ms channing and no one responded . Please give patient a call back  [phone]

## 2023-11-20 ENCOUNTER — Ambulatory Visit: Admitting: Primary Care

## 2023-11-20 ENCOUNTER — Telehealth: Payer: Self-pay | Admitting: Primary Care

## 2023-11-20 ENCOUNTER — Encounter: Payer: Self-pay | Admitting: Primary Care

## 2023-11-20 VITALS — BP 126/64 | HR 71 | Temp 98.0°F | Ht 64.0 in | Wt 204.0 lb

## 2023-11-20 DIAGNOSIS — G4733 Obstructive sleep apnea (adult) (pediatric): Secondary | ICD-10-CM

## 2023-11-20 DIAGNOSIS — G4734 Idiopathic sleep related nonobstructive alveolar hypoventilation: Secondary | ICD-10-CM | POA: Diagnosis not present

## 2023-11-20 DIAGNOSIS — J449 Chronic obstructive pulmonary disease, unspecified: Secondary | ICD-10-CM

## 2023-11-20 MED ORDER — BUDESONIDE 0.5 MG/2ML IN SUSP: 0.5000 mg | Freq: Two times a day (BID) | RESPIRATORY_TRACT | 0 refills | Status: AC | PRN

## 2023-11-20 NOTE — Patient Instructions (Addendum)
 VISIT SUMMARY: Today, we discussed your COPD and obstructive sleep apnea. We reviewed your current medications and considered adding a new treatment to help manage your COPD symptoms. We also talked about the importance of using your CPAP machine regularly and addressed your concerns about smoking cessation.  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 Your lung function is at 56%, and you experience frequent flare-ups. We discussed adding a biologic medication like Nucala to help reduce inflammation and flare-ups. Nucala is given every four weeks. We will start the process based on your insurance coverage. You will continue with your current medications, Breztri  and albuterol , and we encourage you to quit smoking. You will also receive a pneumonia vaccine today.  -OBSTRUCTIVE SLEEP APNEA: Obstructive sleep apnea is a condition where your breathing stops and starts during sleep. You have recently acquired a CPAP machine, which helps keep your airway open while you sleep. You should use the CPAP machine every night for at least four hours. We will follow up in six to eight weeks to see how the therapy is working, but please contact us  sooner if you have any issues.  INSTRUCTIONS: We will set up an appointment with a pharmacist for your first injection and training on how to use the auto-injector. Please continue using your CPAP machine every night and follow up with us  in six to eight weeks to assess its effectiveness. If you experience any issues with the CPAP machine or have any other concerns, do not hesitate to contact us .  Follow-up 8 weeks for CPAP compliance/ asthma follow-up    Mepolizumab Injection What is this medication? MEPOLIZUMAB (me poe LIZ ue mab) prevents the symptoms of asthma and COPD. It is often used when  other medications have not worked well enough or cannot be tolerated. It may also be used to treat other immune system disorders. It works by decreasing the amount of certain white blood cells (eosinophils) in your body. This helps decrease inflammation. Do not use it to treat a sudden asthma attack or COPD flare-up. It is a monoclonal antibody. This medicine may be used for other purposes; ask your health care provider or pharmacist if you have questions. COMMON BRAND NAME(S): Nucala What should I tell my care team before I take this medication? They need to know if you have any of these conditions: Parasitic (helminth) infection An unusual or allergic reaction to mepolizumab, hamster proteins, other medications, foods, dyes, or preservatives Pregnant or trying to get pregnant Breast-feeding How should I use this medication? This medication is injected under the skin. It is usually given by your care team in a hospital or clinic setting. If you get this medication at home, you will be taught how to prepare and give it. Take it as directed on the prescription label. Keep taking it unless your care team tells you to stop. If you use a pen, be sure to take off the outer needle cover before using the dose. It is important that you put your used needles and syringes in a special sharps container. Do not put them in a trash can. If you do not have a sharps container, call your pharmacist or care team to get one. This medication comes with INSTRUCTIONS FOR USE. Ask your pharmacist for directions on how to use this medication. Read the information carefully. Talk to your pharmacist  or care team if you have questions. Talk to your care team about the use of this medication in children. While it may be prescribed for children as young as 6 years for selected conditions, precautions do apply. Overdosage: If you think you have taken too much of this medicine contact a poison control center or emergency room at  once. NOTE: This medicine is only for you. Do not share this medicine with others. What if I miss a dose? If you get this medication at the hospital or clinic: It is important not to miss your dose. Call your care team if you are unable to keep an appointment. If you give yourself this medication at home: If you miss a dose, take it as soon as you can. Then continue your normal schedule. If it is almost time for your next dose, take only that dose. Do not take double or extra doses. Call your care team with questions. What may interact with this medication? Interactions are not expected. This list may not describe all possible interactions. Give your health care provider a list of all the medicines, herbs, non-prescription drugs, or dietary supplements you use. Also tell them if you smoke, drink alcohol , or use illegal drugs. Some items may interact with your medicine. What should I watch for while using this medication? Visit your care team for regular checks on your progress. Tell your care team if your symptoms do not start to get better or if they get worse. Talk with your care team if you have not had chickenpox or the vaccine for chickenpox. Do not stop taking your other asthma medications unless instructed to do so by your care team. Your condition will be monitored carefully while you are receiving this medication. What side effects may I notice from receiving this medication? Side effects that you should report to your care team as soon as possible: Allergic reactions--skin rash, itching, hives, swelling of the face, lips, tongue, or throat Side effects that usually do not require medical attention (report these to your care team if they continue or are bothersome): Back pain Cough Fatigue Headache Joint pain Pain, redness, or irritation at injection site Sore throat This list may not describe all possible side effects. Call your doctor for medical advice about side effects. You may  report side effects to FDA at 1-800-FDA-1088. Where should I keep my medication? Keep out of the reach of children and pets. Store in a refrigerator or at room temperature between 15 and 30 degrees C (59 and 86 degrees F). Refrigeration (preferred): Store it in the refrigerator. Keep it in the original carton until you are ready to take it. Remove the dose from the carton about 30 minutes before it is time for you to take it. Use it within 8 hours of removing it from the carton. If the dose is out of the carton for more than 8 hours, get rid of it. Get rid of any unused medication after the expiration date. Room Temperature: This medication may be stored at room temperature for up to 7 days. Keep it in the original carton until you are ready to take it. Once removed from the carton, it must be used within 8 hours. If it is out of the carton for more than 8 hours, get rid of it. If it is stored at room temperature, get rid of any unused medication after 7 days or after it expires, whichever is first. To get rid of medication that are no longer  needed or have expired: Take the medication to a medication take-back program. Check with your pharmacy or law enforcement to find a location. If you cannot return the medication, ask your pharmacist or care team how to get rid of this medication safely. NOTE: This sheet is a summary. It may not cover all possible information. If you have questions about this medicine, talk to your doctor, pharmacist, or health care provider.  2025 [employer] Standard (2023-07-11 00:00:00)

## 2023-11-20 NOTE — Telephone Encounter (Signed)
 Starting patient on Nucala, will need visit with pharmacy team

## 2023-11-20 NOTE — Progress Notes (Signed)
 @Patient  ID: [name], female    DOB: 04-13-1975, 48 y.o.   MRN: [id number]  No chief complaint on file.   Referring provider: Corwin Antu, FNP  HPI: 48 year old female, current everyday smoker (25 pack year hx). PMH significant for COPD GOLD II, CAP, acute respiratory failure, hypothyroidism, alcohol  dependence, major depression, anemia, tobacco abuse. Patient of Dr. Darlean.  Previous [facility]  07/17/2023 Discussed the use of AI scribe software for clinical note transcription with the patient, who gave verbal consent to proceed.  History of Present Illness   [name] is a 48 year old female with moderate to severe COPD who presents with persistent shortness of breath and fatigue.  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 Her energy levels deplete quickly, often within minutes of starting an activity. Symptoms are manageable when sitting still but worsen with exertion. These issues persist despite previous evaluations and treatments.  She has moderate to severe COPD, with her last pulmonary function test in 2022 showing lung function at 54%, indicating stage 2 to 3 COPD. She continues to smoke and has started using Chantix  to aid in smoking cessation. Her current medications include Symbicort  and Spiriva  inhalers, but her insurance does not cover Breztri . She has not tried Trelegy before. She uses a rescue inhaler, albuterol , and a nebulizer with budesonide  or Pulmicort  up to twice a day as needed for wheezing.  CT chest in March showed borderline cardiomegaly.  Interval decrease in size of enlarged main pulmonary artery.  Interval improvement in bilateral atelectasis/scarring.  Resolved pneumonia right lower lobe. A recent chest x-ray in May showed no acute cardiopulmonary disease. An echocardiogram in May showed an ejection fraction of 55 to 60%.  Home sleep study  on 05/21/2023 showed mild obstructive sleep apnea, AHI 4% 11.8/hour.  SpO2 low 83%.  Patient spent 48 minutes with an oxygen  level less than 88%.  Recommend patient be started on auto CPAP 5 to 15 cm H2O.   She has been on prednisone  previously, which significantly improves her symptoms, although the effects are temporary. She has not been on antibiotics since March.  She is concerned about her ability to return to work due to her current health status and is worried about losing her insurance coverage. She is currently on short-term disability.      Chronic Obstructive Pulmonary Disease (COPD) Moderate to severe COPD with a 7% decline in lung function over the past three years. No exacerbations requiring antibiotics or hospitalization since June. Trelegy is effectively replacing Symbicort  and Spiriva . Daliresp  (Roflumilast ) initiated at 250 mcg, with plans to increase to 500 mcg. Awaiting contact from pulmonary rehab. - Continue Trelegy 200mcg  - Use budesonide  nebulizer as needed for wheezing, up to twice daily. - Use ipratropium albuterol  for dyspnea or chest tightness, up to four times daily. - Increase Daliresp  to 500 mcg to reduce COPD exacerbations. - Ensure pulmonary rehab contacts her. - Discussed smoking cessation as a priority. - Consider biologic therapy with Nucala for recurrent exacerbations if needed in the future   Tobacco Use Disorder Continues smoking despite previous cessation attempts with gum, Chantix , and the patch. Smoking cessation is crucial to prevent further lung function decline. Stress and caregiving responsibilities hinder quitting. - Recommend 'Easy Ways to Quit Smoking' by Dasie Myron. - Emphasize the importance of smoking cessation to prevent further lung function decline.  Sleep Apnea Sleep apnea. CPAP therapy recommended but  cost is a barrier. Frequent nocturnal awakenings, which CPAP may alleviate. - Apply for CPAP assistance through  [email or web address]. - Reassess in three months to evaluate CPAP use and effectiveness.  11/01/2023 Discussed the use of AI scribe software for clinical note transcription with the patient, who gave verbal consent to proceed.  History of Present Illness [name] is a 48 year old female with COPD and sleep apnea who presents for follow-up on her respiratory management. She was referred by her primary care provider for management of her respiratory conditions.  She has been experiencing issues with her breathing, primarily related to her COPD. Symptoms include wheezing and a small amount of yellow mucus production, though not as severe as during previous exacerbations. She has had approximately six to seven exacerbations requiring prednisone  since January, which provides the best relief for her breathing. She has a spacer for her inhaler. She is currently taking Daliresp  at 500 micrograms.  She has sleep apnea and has not yet received a CPAP machine due to financial constraints, including a $500 copayment and lack of hardship paperwork. She has applied for Medicaid to help with insurance coverage. She is exploring alternative options for obtaining a CPAP machine, such as second-hand sources.  In terms of medication, she is currently taking Breztri  and Daliresp . She has not been taking Mucinex  recently but did use it in July.  No significant chest tightness or severe mucus production as seen in previous flare-ups.   1. 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, still smoking (Primary)  2. Hypoxia, sleep related  3. Mild obstructive sleep apnea  Assessment and Plan Assessment & Plan Chronic obstructive pulmonary disease (COPD) with frequent exacerbations COPD with frequent exacerbations, approximately six to seven flare-ups requiring prednisone  since January. Current symptoms include wheezing and mild yellow sputum production. Breztri  has been initiated to replace Trelegy as  the maintenance inhaler. Consideration of Nucala, a biologic injection, due to elevated eosinophils and frequent exacerbations. Nucala is indicated for patients with more than two or three flare-ups or one hospitalization per year. She responds well to prednisone , indicating potential benefit from [place]. - Instruct to use Breztri  twice daily, replacing Trelegy. - Advise to use Pulmicort  as needed for wheezing. - Continue Daliresp  at 500 micrograms daily. - Prescribe a prednisone  taper for current exacerbation. - Recommend Robitussin or Mucinex  for 5-7 days to loosen chest congestion. - Schedule follow-up appointment in two weeks to discuss potential initiation of Dupixent, Nucala or Tezspire   Obstructive sleep apnea Obstructive sleep apnea with difficulty obtaining CPAP due to financial constraints. She has not received the CPAP machine due to a $500 copayment and lack of hardship paperwork. Medicaid application is in process, which may provide better coverage for CPAP therapy. Alternative options include applying for patient assistance and exploring second-hand CPAP machines. - Provide information on applying for patient assistance for CPAP through [email or web address]. - Advise to apply for Medicaid and notify if coverage is obtained to resubmit CPAP order. - Suggest exploring second-hand CPAP machines from family or online sources. - Recommend side sleeping position or use of a wedge pillow to reduce apneic events. -Continue to wear supplemental oxygen  at night while sleeping    11/20/2023- Interim hx  Discussed the use of AI scribe software for clinical note transcription with the patient, who gave verbal consent to proceed.  History of Present Illness [name] is a 48 year old female with moderate-severe COPD with asthma who presents for discussion of add-on treatments.  She  is currently on triple therapy with Breztri  and uses albuterol  a couple of times a day. Her symptoms are  most severe in the early morning and late night. She was last on prednisone  on November 01, 2023, which improved her symptoms. She is also on Daliresp  (Roflumilast ) for her COPD. She continues to smoke. She received a flu shot this year but is due for a pneumonia vaccine.   She has recently been approved for Medicaid and is in the process of obtaining her own CPAP machine, which she plans to use nightly for at least four hours.  She feels stressed about her smoking habit and has previously quit for a year. She is considering going back to school to find less strenuous work.   Allergies  Allergen Reactions   Chantix  [Varenicline ] Other (See Comments)    weird dreams' and nauseated    Immunization History  Administered Date(s) Administered   Influenza Inj Mdck Quad With Preservative 09/26/2021   Influenza,inj,Quad PF,6+ Mos 11/12/2014, 02/13/2016, 12/07/2016, 02/19/2020   Janssen (J&J) SARS-COV-2 Vaccination 07/21/2019    Past Medical History:  Diagnosis Date   Alcohol  abuse    Anemia 01/31/2011   Asthma    COPD (chronic obstructive pulmonary disease) (HCC)    Depression    Hepatitis C 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 10/24/2021   RNA negative.  Hep C false positive.     Hypoxia 02/01/2011   Medical history non-contributory    Needs sleep apnea assessment 2013.07.31   Tobacco abuse    Vertigo 04/2023    Tobacco History: Social History   Tobacco Use  Smoking Status Every Day   Current packs/day: 0.00   Average packs/day: 0.5 packs/day for 25.0 years (12.5 ttl pk-yrs)   Types: Cigarettes   Start date: 11/11/1996   Last attempt to quit: 11/11/2021   Years since quitting: 2.0   Passive exposure: Current  Smokeless Tobacco Never  Tobacco Comments   Pt smokes 0.5 ppd. AB, CMA 09-07-23          1 pack a day. Trying to quit started using chantix .  05/11/2023   Ready to quit: Not Answered Counseling given: Not Answered Tobacco comments: Pt smokes 0.5 ppd. AB, CMA 09-07-23    1  pack a day. Trying to quit started using chantix .  05/11/2023   Outpatient Medications Prior to Visit  Medication Sig Dispense Refill   albuterol  (VENTOLIN  HFA) 108 (90 Base) MCG/ACT inhaler Inhale 2 puffs into the lungs every 4 (four) hours as needed. 18 g 2   ALPRAZolam  (XANAX ) 0.25 MG tablet Take 1 tablet (0.25 mg total) by mouth 2 (two) times daily as needed for anxiety or sleep.     azelastine  (ASTELIN ) 0.1 % nasal spray Place 1 spray into both nostrils 2 (two) times daily. Use in each nostril as directed 30 mL 1   budesonide  (PULMICORT ) 0.5 MG/2ML nebulizer solution Take 2 mLs (0.5 mg total) by nebulization 2 (two) times daily as needed (Wheezing). 60 mL 0   budesonide -glycopyrrolate-formoterol  (BREZTRI  AEROSPHERE) 160-9-4.8 MCG/ACT AERO inhaler Inhale 2 puffs into the lungs 2 (two) times daily. 10.7 g 11   budesonide -glycopyrrolate-formoterol  (BREZTRI  AEROSPHERE) 160-9-4.8 MCG/ACT AERO inhaler Inhale 2 puffs into the lungs in the morning and at bedtime.     cyclobenzaprine  (FLEXERIL ) 10 MG tablet Take 1 tablet (10 mg total) by mouth 3 (three) times daily as needed for muscle spasms. (Patient not taking: Reported on 11/01/2023) 30 tablet 0   FLUoxetine  (PROZAC ) 40 MG capsule Take 1 capsule (40  mg total) by mouth daily. 90 capsule 3   gabapentin  (NEURONTIN ) 300 MG capsule TAKE 1 CAPSULE BY MOUTH ONCE DAILY AS NEEDED FOR PAIN 90 capsule 0   meclizine  (ANTIVERT ) 25 MG tablet TAKE 1/2 TO 1 (ONE-HALF TO ONE) TABLET BY MOUTH ONCE DAILY AS NEEDED FOR VERTIGO 30 tablet 0   meloxicam  (MOBIC ) 7.5 MG tablet Take 1 tablet (7.5 mg total) by mouth 2 (two) times daily. 180 tablet 0   Nebulizer System All-In-One MISC 1 Device by Does not apply route daily as needed. 1 each 0   oxyCODONE -acetaminophen  (PERCOCET) 10-325 MG tablet Take 1 tablet by mouth 4 (four) times daily as needed.     roflumilast  (DALIRESP ) 500 MCG TABS tablet Take 1 tablet (500 mcg total) by mouth daily. 30 tablet 11   SYNTHROID  200 MCG  tablet Take one tablet 200 mcg along with one 25 mcg tablet once daily (total 225 mcg once daily) 30 tablet 1   SYNTHROID  25 MCG tablet Take 1 25 mcg tablet along with one 200 mcg tablet once daily for total of 225 mcg once daily 30 tablet 1   Facility-Administered Medications Prior to Visit  Medication Dose Route Frequency Provider Last Rate Last Admin   ipratropium-albuterol  (DUONEB) 0.5-2.5 (3) MG/3ML nebulizer solution 3 mL  3 mL Nebulization Q6H Hope Kari ORN, NP   3 mL at 11/17/21 1357      Review of Systems  Review of Systems  Constitutional:  Positive for fatigue.  HENT: Negative.    Respiratory:  Positive for wheezing.   Cardiovascular: Negative.      Physical Exam  LMP 08/22/2022 (Approximate)  Physical Exam Constitutional:      General: She is not in acute distress.    Appearance: Normal appearance. She is well-developed.  HENT:     Head: Normocephalic and atraumatic.     Mouth/Throat:     Mouth: Mucous membranes are moist.     Pharynx: Oropharynx is clear.  Eyes:     Pupils: Pupils are equal, round, and reactive to light.  Cardiovascular:     Rate and Rhythm: Normal rate and regular rhythm.     Heart sounds: Normal heart sounds. No murmur heard. Pulmonary:     Effort: Pulmonary effort is normal. No respiratory distress.     Breath sounds: Wheezing present. No rhonchi.  Abdominal:     Tenderness: There is no abdominal tenderness.  Musculoskeletal:        General: Normal range of motion.     Cervical back: Normal range of motion and neck supple.  Skin:    General: Skin is warm and dry.     Findings: No erythema or rash.  Neurological:     General: No focal deficit present.     Mental Status: She is alert and oriented to person, place, and time. Mental status is at baseline.  Psychiatric:        Mood and Affect: Mood normal.        Behavior: Behavior normal.        Thought Content: Thought content normal.        Judgment: Judgment normal.      CBC    Component Value Date/Time   WBC 6.5 10/29/2023 1341   RBC 4.62 10/29/2023 1341   HGB 12.3 10/29/2023 1341   HGB 11.6 (L) 05/13/2013 0449   HCT 38.8 10/29/2023 1341   HCT 36.2 05/13/2013 0449   PLT 319.0 10/29/2023 1341   PLT 264 05/13/2013 0449  MCV 84.0 10/29/2023 1341   MCV 82 05/13/2013 0449   MCH 26.1 05/07/2023 1125   MCHC 31.6 10/29/2023 1341   RDW 15.7 (H) 10/29/2023 1341   RDW 15.3 (H) 05/13/2013 0449   LYMPHSABS 1.8 10/29/2023 1341   LYMPHSABS 2.4 05/13/2013 0449   MONOABS 0.5 10/29/2023 1341   MONOABS 0.9 05/13/2013 0449   EOSABS 0.3 10/29/2023 1341   EOSABS 0.0 05/13/2013 0449   BASOSABS 0.1 10/29/2023 1341   BASOSABS 0.0 05/13/2013 0449    BMET    Component Value Date/Time   NA 140 07/06/2023 0917   NA 136 05/11/2013 0341   K 4.3 07/06/2023 0917   K 4.1 05/11/2013 0341   CL 104 07/06/2023 0917   CL 106 05/11/2013 0341   CO2 29 07/06/2023 0917   CO2 27 05/11/2013 0341   GLUCOSE 88 07/06/2023 0917   GLUCOSE 143 (H) 05/11/2013 0341   BUN 15 07/06/2023 0917   BUN 12 05/11/2013 0341   CREATININE 0.68 07/06/2023 0917   CREATININE 0.71 05/15/2013 0629   CALCIUM 9.4 07/06/2023 0917   CALCIUM 8.7 05/11/2013 0341   GFRNONAA >60 05/07/2023 1125   GFRNONAA >60 05/15/2013 0629   GFRAA >60 10/25/2019 0739   GFRAA >60 05/15/2013 0629    BNP    Component Value Date/Time   BNP 9.0 04/10/2022 0903    ProBNP    Component Value Date/Time   PROBNP 20.0 07/06/2023 0917    Imaging: No results found.   Assessment & Plan:   No problem-specific Assessment & Plan notes found for this encounter.   1. 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, still smoking (Primary)  2. Hypoxia, sleep related  3. Moderate obstructive sleep apnea   Assessment and Plan Assessment & Plan Chronic obstructive pulmonary disease with asthmatic component Stage two to three COPD with an asthmatic component. Lung function at 56%. Frequent exacerbations and elevated  eosinophils. Currently on triple therapy with Breztri , daliresp  and using albuterol  a couple of times a day. Responds to prednisone . Discussed adding a biologic, such as Nucala or Dupixent, to reduce eosinophils and decrease inflammation, potentially reducing flare-ups and hospitalizations. Discussed the use of an auto-injector for self-administration. She is open to learning despite fear of needles. - Start process for Nucala  - Set up appointment with pharmacist for first injection and training on auto-injector use. - Continue to use Breztri  twice daily, replacing Trelegy. - Advise to use Pulmicort  as needed for wheezing. - Continue Daliresp  at 500 micrograms daily. - Continue Albuterol  2 puffs every 4-6 hours for breakthrough symptoms sob/wheezing - Encourage smoking cessation.  Mild obstructive sleep apnea Recently acquired CPAP machine from a friend but is getting medicaid shortly and will be able to get CPAP machine through her insurance. Advised to use CPAP every night for at least four hours during sleep. - Ensure CPAP machine setup is completed - Instruct to use CPAP every night for at least four hours. - Schedule follow-up in six to eight weeks to assess CPAP therapy efficacy. - Advise to return sooner if experiencing issues with CPAP.   Kari LELON Ferrari, NP 11/20/2023

## 2023-11-21 ENCOUNTER — Telehealth: Payer: Self-pay

## 2023-11-21 ENCOUNTER — Other Ambulatory Visit: Payer: Self-pay | Admitting: Family

## 2023-11-21 DIAGNOSIS — G8929 Other chronic pain: Secondary | ICD-10-CM

## 2023-11-21 NOTE — Telephone Encounter (Signed)
 Patient will be Nucala new start per Landry Ferrari, NP  Submitted a Prior Authorization request to [facility] for NUCALA via CoverMyMeds. Will update once we receive a response.  Key: [id number]   Sherry Pennant, PharmD, MPH, BCPS, CPP Clinical Pharmacist [facility])

## 2023-11-23 ENCOUNTER — Other Ambulatory Visit (HOSPITAL_COMMUNITY): Payer: Self-pay

## 2023-11-23 NOTE — Telephone Encounter (Signed)
 Submitted a Prior Authorization request to [employer] for NUCALA via CoverMyMeds. Will update once we receive a response.  Key: [id number]

## 2023-11-23 NOTE — Telephone Encounter (Signed)
 Received notification from [facility] regarding a prior authorization for NUCALA. Authorization has been APPROVED from 11/22/23 to 11/21/24. Approval letter sent to scan center.  Unable to run test claim because pt must fill [facility].  Authorization # [id number] Phone # [phone]   Pt also has medicaid, will submit pa to them as well

## 2023-11-26 NOTE — Telephone Encounter (Signed)
 Received a fax regarding Prior Authorization from [employer] for NUCALA. Authorization has been DENIED because pt must t/f Dupixent or have a contraindication to it.  Phone# [phone]  Will proceed with only using primary plan and send message to pt to enroll in copay card.

## 2023-11-27 ENCOUNTER — Other Ambulatory Visit (HOSPITAL_COMMUNITY): Payer: Self-pay

## 2023-11-27 ENCOUNTER — Ambulatory Visit (INDEPENDENT_AMBULATORY_CARE_PROVIDER_SITE_OTHER)

## 2023-11-27 ENCOUNTER — Telehealth: Payer: Self-pay

## 2023-11-27 DIAGNOSIS — Z23 Encounter for immunization: Secondary | ICD-10-CM | POA: Diagnosis not present

## 2023-11-27 NOTE — Telephone Encounter (Signed)
 Will initiate Dupixent Biv in new encounter

## 2023-11-27 NOTE — Telephone Encounter (Signed)
 Yes I'm fine with using Dupixent

## 2023-11-27 NOTE — Telephone Encounter (Signed)
 Spoke to patient - she reports Nurse, learning disability coverage will be ending and she will only have Medicaid coverage.  Per benefits investigation, Medicaid DENIED Nucala coverage due to patient must try and fail Dupixent or have a contraindication to it.   Routing to provider team to ensure okay to use Dupixent prior to proceeding with Medicaid PA for Dupixent and new start scheduling.   Aleck Puls, PharmD, BCPS, CPP Clinical Pharmacist  [facility]

## 2023-11-27 NOTE — Progress Notes (Signed)
 Per orders of Ginger Patrick, NP, injection of Pneumonia Vaccine given by Nellie Hummer in left deltoid. Patient tolerated injection well.

## 2023-11-27 NOTE — Telephone Encounter (Signed)
 Pt will try Dupixent instead of Nucala because Medicaid prefers Dupixent. Submitted a Prior Authorization request to [facility] (commercial) for DUPIXENT via CoverMyMeds. Will update once we receive a response.  Key: [id number]     Submitted a Prior Authorization request to [facility] MEDICAID for DUPIXENT via CoverMyMeds. Will update once we receive a response.  Key: [facility]

## 2023-11-29 NOTE — Telephone Encounter (Signed)
 Received notification from [facility] MEDICAID regarding a prior authorization for DUPIXENT. Authorization has been APPROVED from 11/27/23 to 05/25/24. Approval letter sent to scan center.  Authorization # [id number] Phone # [phone]  Will await response from commercial plan.

## 2023-11-30 ENCOUNTER — Other Ambulatory Visit (HOSPITAL_COMMUNITY): Payer: Self-pay

## 2023-11-30 NOTE — Telephone Encounter (Signed)
 Received notification from [facility] regarding a prior authorization for DUPIXENT. Authorization has been APPROVED from 11/30/23 to 11/29/24. Approval letter sent to scan center.  Unable to run test claim because pt must fill [facility] [phone].  Authorization # [id number] Phone # [phone]

## 2023-11-30 NOTE — Telephone Encounter (Signed)
 Spoke to patient for new start Dupixent for COPD-asthma overlap. Will use sample at [facility] on 12/04/23. Thereafter, Rx will [facility].   Aleck Puls, PharmD, BCPS, CPP Clinical Pharmacist  [facility]

## 2023-12-04 ENCOUNTER — Other Ambulatory Visit

## 2023-12-06 ENCOUNTER — Ambulatory Visit (INDEPENDENT_AMBULATORY_CARE_PROVIDER_SITE_OTHER)

## 2023-12-06 DIAGNOSIS — J449 Chronic obstructive pulmonary disease, unspecified: Secondary | ICD-10-CM | POA: Diagnosis not present

## 2023-12-06 DIAGNOSIS — Z7189 Other specified counseling: Secondary | ICD-10-CM

## 2023-12-06 MED ORDER — DUPIXENT 300 MG/2ML ~~LOC~~ SOAJ: 300.0000 mg | SUBCUTANEOUS | 3 refills | Status: AC

## 2023-12-06 NOTE — Progress Notes (Signed)
 HPI Patient presents today to [facility] to see pharmacy team for Dupixent new start. She has COPD with asthmatic component. There have been 4 dispenses of prednisone  since January 2025. Last seen by Landry Ferrari, NP, on 11/20/23. At that time, plan was to start biologic.   Respiratory Medications Current regimen: Breztri  160-9-4.8 mcg/act (Inhale 2 puffs into the lungs 2 (two) times daily), Daliresp  500 mcg tab (Take 1 tablet (500 mcg total) by mouth daily), Ventolin  108 mcg/act (Inhale 2 puffs into the lungs every 4 (four) hours as needed), Pulmicort  0.5mg /59mL neb soln (Take 2 mLs (0.5 mg total) by nebulization 2 (two) times daily as needed (Wheezing))  Patient reports no known adherence challenges  OBJECTIVE Allergies  Allergen Reactions   Chantix  [Varenicline ] Other (See Comments)    weird dreams' and nauseated    Outpatient Encounter Medications as of 12/06/2023  Medication Sig   albuterol  (VENTOLIN  HFA) 108 (90 Base) MCG/ACT inhaler Inhale 2 puffs into the lungs every 4 (four) hours as needed.   ALPRAZolam  (XANAX ) 0.25 MG tablet Take 1 tablet (0.25 mg total) by mouth 2 (two) times daily as needed for anxiety or sleep.   azelastine  (ASTELIN ) 0.1 % nasal spray Place 1 spray into both nostrils 2 (two) times daily. Use in each nostril as directed   budesonide  (PULMICORT ) 0.5 MG/2ML nebulizer solution Take 2 mLs (0.5 mg total) by nebulization 2 (two) times daily as needed (Wheezing).   budesonide -glycopyrrolate-formoterol  (BREZTRI  AEROSPHERE) 160-9-4.8 MCG/ACT AERO inhaler Inhale 2 puffs into the lungs 2 (two) times daily.   budesonide -glycopyrrolate-formoterol  (BREZTRI  AEROSPHERE) 160-9-4.8 MCG/ACT AERO inhaler Inhale 2 puffs into the lungs in the morning and at bedtime.   cyclobenzaprine  (FLEXERIL ) 10 MG tablet Take 1 tablet (10 mg total) by mouth 3 (three) times daily as needed for muscle spasms. (Patient not taking: Reported on 11/20/2023)   FLUoxetine  (PROZAC ) 40 MG capsule Take  1 capsule (40 mg total) by mouth daily.   gabapentin  (NEURONTIN ) 300 MG capsule TAKE 1 CAPSULE BY MOUTH ONCE DAILY AS NEEDED FOR PAIN   meclizine  (ANTIVERT ) 25 MG tablet TAKE 1/2 TO 1 (ONE-HALF TO ONE) TABLET BY MOUTH ONCE DAILY AS NEEDED FOR VERTIGO   meloxicam  (MOBIC ) 7.5 MG tablet Take 1 tablet (7.5 mg total) by mouth 2 (two) times daily.   Nebulizer System All-In-One MISC 1 Device by Does not apply route daily as needed.   oxyCODONE -acetaminophen  (PERCOCET) 10-325 MG tablet Take 1 tablet by mouth 4 (four) times daily as needed.   roflumilast  (DALIRESP ) 500 MCG TABS tablet Take 1 tablet (500 mcg total) by mouth daily.   SYNTHROID  200 MCG tablet Take one tablet 200 mcg along with one 25 mcg tablet once daily (total 225 mcg once daily)   SYNTHROID  25 MCG tablet Take 1 25 mcg tablet along with one 200 mcg tablet once daily for total of 225 mcg once daily   Facility-Administered Encounter Medications as of 12/06/2023  Medication   ipratropium-albuterol  (DUONEB) 0.5-2.5 (3) MG/3ML nebulizer solution 3 mL     Immunization History  Administered Date(s) Administered   Influenza Inj Mdck Quad With Preservative 09/26/2021   Influenza, Seasonal, Injecte, Preservative Fre 10/30/2023   Influenza,inj,Quad PF,6+ Mos 11/12/2014, 02/13/2016, 12/07/2016, 02/19/2020   Janssen (J&J) SARS-COV-2 Vaccination 07/21/2019   PNEUMOCOCCAL CONJUGATE-20 11/27/2023     PFTs    Latest Ref Rng & Units 08/31/2023   12:41 PM 05/14/2020   11:48 AM 02/19/2020   10:54 AM  PFT Results  FVC-Pre L 2.26  2.44  2.35   FVC-Predicted Pre % 61  64  62   FVC-Post L 2.59   2.77   FVC-Predicted Post % 70   73   Pre FEV1/FVC % % 62  68  65   Post FEV1/FCV % % 64   67   FEV1-Pre L 1.39  1.65  1.52   FEV1-Predicted Pre % 47  54  50   FEV1-Post L 1.66   1.85   DLCO uncorrected ml/min/mmHg 15.90   7.86   DLCO UNC% % 72   35   DLCO corrected ml/min/mmHg 15.90   7.86   DLCO COR %Predicted % 72   35   DLVA Predicted % 84   46    TLC L 5.53   5.50   TLC % Predicted % 106   105   RV % Predicted % 142   148      Eosinophils Most recent blood eosinophil count was 300 cells/microL taken on 10/29/23.   IgE: 62 on 01/16/20   Assessment   Biologics training for dupilumab (Dupixent)  Goals of therapy: Mechanism: human monoclonal IgG4 antibody that inhibits interleukin-4 and interleukin-13 cytokine-induced responses, including release of proinflammatory cytokines, chemokines, and IgE Reviewed that Dupixent is add-on medication and patient must continue maintenance inhaler regimen. Response to therapy: may take 4 months to determine efficacy. Discussed that patients generally feel improvement sooner than 4 months.  Side effects: injection site reaction (6-18%), antibody development (5-16%), ophthalmic conjunctivitis (2-16%), transient blood eosinophilia (1-2%)  Dose: 600mg  at Week 0 (administered today in clinic) followed by 300mg  every 14 days thereafter  Administration/Storage:  Reviewed administration sites of thigh or abdomen (at least 2-3 inches away from abdomen). Reviewed the upper arm is only appropriate if caregiver is administering injection  Do not shake pen/syringe as this could lead to product foaming or precipitation. Do not use if solution is discolored or contains particulate matter or if window on prefilled pen is yellow (indicates pen has been used).  Reviewed storage of medication in refrigerator. Reviewed that Dupixent can be stored at room temperature in unopened carton for up to 14 days.  Access: Approval of Dupixent through: insurance  Patient self-administered Dupixent 300mg /46ml x 2 (total dose 600mg ) in right upper thigh and left upper thigh using sample  Dupixent 300mg /79mL autoinjector pen NDC: 0024-5915-20 Lot: [id number] Expiration: 2025-10-13  Patient monitored for 30 minutes for adverse reaction.  Patient tolerated well.  Injection site noted. Patient denies itchiness and irritation at  injection.  PLAN CONTINUE Dupixent 300mg  every 14 days.  Next dose is due 12/20/23 and every 14 days thereafter. Rx sent to: [facility]: [phone].  Patient provided with pharmacy phone number and advised to call later this week to schedule shipment to home. CONTINUE Breztri  160-9-4.8 mcg/act (Inhale 2 puffs into the lungs 2 (two) times daily) CONTINUE Daliresp  500 mcg tab (Take 1 tablet (500 mcg total) by mouth daily), CONTINUE Ventolin  108 mcg/act (Inhale 2 puffs into the lungs every 4 (four) hours as needed) CONTINUE Pulmicort  0.5mg /51mL neb soln (Take 2 mLs (0.5 mg total) by nebulization 2 (two) times daily as needed (Wheezing))  All questions encouraged and answered.  Instructed patient to reach out with any further questions or concerns.  Thank you for allowing pharmacy to participate in this patient's care.  This appointment required 45 minutes of patient care (this includes precharting, chart review, review of results, face-to-face care, etc.).

## 2023-12-06 NOTE — Patient Instructions (Signed)
 Your next Dupixent dose is due on 12/20/23, 01/03/24, and every 14 days thereafter  CONTINUE all other respiratory medications as prescribed. Dupixent does NOT replace your other medications.  Your prescription will be shipped from [facility]. Their phone number is  [phone].  Please call to schedule shipment and confirm address. They will mail your medication to your home.  You will need to be seen by your provider in 3 to 4 months to assess how Dupixent is working for you. You have a follow-up appointment scheduled on 01/15/24 with Landry Ferrari, NP.   Stay up to date on all routine vaccines: influenza, pneumonia, COVID19, Shingles  How to manage an injection site reaction: Remember the 5 C's: COUNTER - leave on the counter at least 30 minutes but up to overnight to bring medication to room temperature. This may help prevent stinging COLD - place something cold (like an ice gel pack or cold water bottle) on the injection site just before cleansing with alcohol . This may help reduce pain CLARITIN - use Claritin (generic name is loratadine) for the first two weeks of treatment or the day of, the day before, and the day after injecting. This will help to minimize injection site reactions CORTISONE CREAM - apply if injection site is irritated and itching CALL ME - if injection site reaction is bigger than the size of your fist, looks infected, blisters, or if you develop hives

## 2023-12-11 ENCOUNTER — Ambulatory Visit: Payer: Self-pay

## 2023-12-11 NOTE — Telephone Encounter (Signed)
 Spoke with pt and advised her that we do not have any available appointments before her current scheduled appointment. Advised her that if her breathing worsens, her pain increases or if she starts to feel worse, she needs to seek emergency care. Pt verbalized understanding.

## 2023-12-11 NOTE — Telephone Encounter (Signed)
 FYI Only or Action Required?: Action required by provider: Please call with sooner OV .  Patient was last seen in primary care on 10/29/2023 by Corwin Antu, FNP.  Called Nurse Triage reporting Cough.  Symptoms began yesterday.  Interventions attempted: OTC medications: Tylenol .  Symptoms are: gradually worsening.  Triage Disposition: See Physician Within 24 Hours  Patient/caregiver understands and will follow disposition?: Yes  Copied from CRM [id number]. Topic: Clinical - Red Word Triage >> Dec 11, 2023  1:29 PM [name] wrote: Red Word that prompted transfer to Nurse Triage: Feeling really bad, coughing hot one minute then cold, whole body in pain (6 out of 10) Reason for Disposition  [1] Known COPD or other severe lung disease (i.e., bronchiectasis, cystic fibrosis, lung surgery) AND [2] symptoms getting worse (i.e., increased sputum purulence or amount, increased breathing difficulty  Answer Assessment - Initial Assessment Questions Generalized pain- 6/10, coughing worse at night  Mucinex - to help get up phlegm- clear not yellow or green. Tylenol - not helping. Meloxicam  and Oxy takes at baseline   Started yesterday with chills and body aches. Doesn't have a fever.   COPD at baseline- does not feel like breathing is worse at this time but does not want to cause issues the longer she waits. Appt Thurs AM with NP Dugal. Appt on waitlist to be seen sooner. Please call if anything opens up.   1. ONSET: When did the cough begin?      Yesterday  2. SEVERITY: How bad is the cough today?      Worse at night, sometimes during  3. SPUTUM: Describe the color of your sputum (e.g., none, dry cough; clear, white, yellow, green)     Clear still  4. HEMOPTYSIS: Are you coughing up any blood? If Yes, ask: How much? (e.g., flecks, streaks, tablespoons, etc.)     denies 5. DIFFICULTY BREATHING: Are you having difficulty breathing? If Yes, ask: How bad is it? (e.g., mild,  moderate, severe)      denies 6. FEVER: Do you have a fever? If Yes, ask: What is your temperature, how was it measured, and when did it start?     Hot and cold spells 7. CARDIAC HISTORY: Do you have any history of heart disease? (e.g., heart attack, congestive heart failure)      Pulm HTN  8. LUNG HISTORY: Do you have any history of lung disease?  (e.g., pulmonary embolus, asthma, emphysema)     COPD, asthma 9. PE RISK FACTORS: Do you have a history of blood clots? (or: recent major surgery, recent prolonged travel, bedridden)     denies 10. OTHER SYMPTOMS: Do you have any other symptoms? (e.g., runny nose, wheezing, chest pain)       Mild wheeze but always present with her  COPD and Asthma 12. TRAVEL: Have you traveled out of the country in the last month? (e.g., travel history, exposures)       denies  Protocols used: Cough - Acute Productive-A-AH

## 2023-12-13 ENCOUNTER — Encounter: Payer: Self-pay | Admitting: Family

## 2023-12-13 ENCOUNTER — Ambulatory Visit (INDEPENDENT_AMBULATORY_CARE_PROVIDER_SITE_OTHER): Admitting: Family

## 2023-12-13 VITALS — BP 116/68 | HR 88 | Temp 97.9°F | Ht 64.0 in | Wt 209.0 lb

## 2023-12-13 DIAGNOSIS — J441 Chronic obstructive pulmonary disease with (acute) exacerbation: Secondary | ICD-10-CM | POA: Diagnosis not present

## 2023-12-13 MED ORDER — PREDNISONE 20 MG PO TABS: ORAL_TABLET | ORAL | 0 refills | Status: DC

## 2023-12-13 NOTE — Progress Notes (Signed)
 Established Patient Office Visit  Subjective:      CC:  Chief Complaint  Patient presents with   Acute Visit    Reports cough, wheezing, increased shortness of breath, chest tightness at night time. Cough is producing thick mucus. Denies fever, chills, body aches. Symptoms started 4 days ago.    HPI: [name] is a 48 y.o. female presenting on 12/13/2023 for Acute Visit (Reports cough, wheezing, increased shortness of breath, chest tightness at night time. Cough is producing thick mucus. Denies fever, chills, body aches. Symptoms started 4 days ago.) .  Discussed the use of AI scribe software for clinical note transcription with the patient, who gave verbal consent to proceed.  History of Present Illness [name] is a 48 year old female who presents with respiratory symptoms including wheezing and sore throat.  Her symptoms began four days ago with wheezing, a common occurrence for her, along with a sore throat and sinus pressure or facial congestion. She has not experienced any fever.  She has been taking Mucinex , which has provided some relief. She feels slightly better today compared to previous days, although earlier in the week she was unable to get out of bed due to severe body aches. She has not been swabbed for any infections and has not checked for COVID-19 at home.  She uses a nebulizer at home a couple of times a day. The last time she took prednisone  was a few months ago. She reports tenderness in her throat area.  She experiences difficulty taking deep breaths and reports body aches.         Social history:  Relevant past medical, surgical, family and social history reviewed and updated as indicated. Interim medical history since our last visit reviewed.  Allergies and medications reviewed and updated.  DATA REVIEWED: CHART IN EPIC     ROS: Negative unless specifically indicated above in HPI.    Current Outpatient Medications:     albuterol  (VENTOLIN  HFA) 108 (90 Base) MCG/ACT inhaler, Inhale 2 puffs into the lungs every 4 (four) hours as needed., Disp: 18 g, Rfl: 2   ALPRAZolam  (XANAX ) 0.25 MG tablet, Take 1 tablet (0.25 mg total) by mouth 2 (two) times daily as needed for anxiety or sleep., Disp: , Rfl:    azelastine  (ASTELIN ) 0.1 % nasal spray, Place 1 spray into both nostrils 2 (two) times daily. Use in each nostril as directed, Disp: 30 mL, Rfl: 1   budesonide  (PULMICORT ) 0.5 MG/2ML nebulizer solution, Take 2 mLs (0.5 mg total) by nebulization 2 (two) times daily as needed (Wheezing)., Disp: 60 mL, Rfl: 0   budesonide -glycopyrrolate-formoterol  (BREZTRI  AEROSPHERE) 160-9-4.8 MCG/ACT AERO inhaler, Inhale 2 puffs into the lungs 2 (two) times daily., Disp: 10.7 g, Rfl: 11   budesonide -glycopyrrolate-formoterol  (BREZTRI  AEROSPHERE) 160-9-4.8 MCG/ACT AERO inhaler, Inhale 2 puffs into the lungs in the morning and at bedtime., Disp: , Rfl:    Dupilumab (DUPIXENT) 300 MG/2ML SOAJ, Inject 300 mg into the skin every 14 (fourteen) days., Disp: 4 mL, Rfl: 3   FLUoxetine  (PROZAC ) 40 MG capsule, Take 1 capsule (40 mg total) by mouth daily., Disp: 90 capsule, Rfl: 3   gabapentin  (NEURONTIN ) 300 MG capsule, TAKE 1 CAPSULE BY MOUTH ONCE DAILY AS NEEDED FOR PAIN, Disp: 90 capsule, Rfl: 0   meclizine  (ANTIVERT ) 25 MG tablet, TAKE 1/2 TO 1 (ONE-HALF TO ONE) TABLET BY MOUTH ONCE DAILY AS NEEDED FOR VERTIGO, Disp: 30 tablet, Rfl: 0   meloxicam  (MOBIC ) 7.5 MG tablet,  Take 1 tablet (7.5 mg total) by mouth 2 (two) times daily., Disp: 180 tablet, Rfl: 0   Nebulizer System All-In-One MISC, 1 Device by Does not apply route daily as needed., Disp: 1 each, Rfl: 0   oxyCODONE -acetaminophen  (PERCOCET) 10-325 MG tablet, Take 1 tablet by mouth 4 (four) times daily as needed., Disp: , Rfl:    predniSONE  (DELTASONE ) 20 MG tablet, Take two tablets once daily for five days, Disp: 10 tablet, Rfl: 0   roflumilast  (DALIRESP ) 500 MCG TABS tablet, Take 1 tablet  (500 mcg total) by mouth daily., Disp: 30 tablet, Rfl: 11   SYNTHROID  200 MCG tablet, Take one tablet 200 mcg along with one 25 mcg tablet once daily (total 225 mcg once daily), Disp: 30 tablet, Rfl: 1   SYNTHROID  25 MCG tablet, Take 1 25 mcg tablet along with one 200 mcg tablet once daily for total of 225 mcg once daily, Disp: 30 tablet, Rfl: 1  Current Facility-Administered Medications:    ipratropium-albuterol  (DUONEB) 0.5-2.5 (3) MG/3ML nebulizer solution 3 mL, 3 mL, Nebulization, Q6H, Hope Almarie ORN, NP, 3 mL at 11/17/21 1357        Objective:        BP 116/68 (BP Location: Left Arm, Patient Position: Sitting, Cuff Size: Large)   Pulse 88   Temp 97.9 F (36.6 C) (Temporal)   Ht 5' 4 (1.626 m)   Wt 209 lb (94.8 kg)   LMP 08/22/2022 (Approximate)   SpO2 98%   BMI 35.87 kg/m   Physical Exam NECK: Tender and swollen cervical lymph nodes CHEST: Decreased breath sounds, difficulty taking deep breath  Wt Readings from Last 3 Encounters:  12/13/23 209 lb (94.8 kg)  11/20/23 204 lb (92.5 kg)  10/29/23 200 lb (90.7 kg)    Physical Exam Vitals reviewed.  Constitutional:      General: She is not in acute distress.    Appearance: Normal appearance. She is normal weight. She is not ill-appearing, toxic-appearing or diaphoretic.  HENT:     Head: Normocephalic.     Right Ear: Tympanic membrane normal.     Left Ear: Tympanic membrane normal.     Nose:     Right Sinus: Maxillary sinus tenderness present.     Left Sinus: Maxillary sinus tenderness present.     Mouth/Throat:     Mouth: Mucous membranes are dry.     Pharynx: No oropharyngeal exudate, posterior oropharyngeal erythema or postnasal drip.  Eyes:     Extraocular Movements: Extraocular movements intact.     Pupils: Pupils are equal, round, and reactive to light.  Cardiovascular:     Rate and Rhythm: Normal rate and regular rhythm.     Pulses: Normal pulses.     Heart sounds: Normal heart sounds.  Pulmonary:      Effort: Pulmonary effort is normal.     Breath sounds: Decreased air movement present. Examination of the right-upper field reveals wheezing. Examination of the left-upper field reveals wheezing. Examination of the right-middle field reveals wheezing. Examination of the left-middle field reveals wheezing. Examination of the right-lower field reveals wheezing. Examination of the left-lower field reveals wheezing. Wheezing present.  Musculoskeletal:     Cervical back: Normal range of motion.  Lymphadenopathy:     Cervical: Cervical adenopathy present.     Right cervical: Superficial cervical adenopathy present.     Left cervical: Superficial cervical adenopathy present.  Neurological:     General: No focal deficit present.     Mental Status: She  is alert and oriented to person, place, and time. Mental status is at baseline.  Psychiatric:        Mood and Affect: Mood normal.        Behavior: Behavior normal.        Thought Content: Thought content normal.        Judgment: Judgment normal.          Results   Assessment & Plan:   Assessment and Plan Assessment & Plan Acute upper respiratory infection with sore throat and chest symptoms Acute upper respiratory infection with sore throat, chest discomfort, sinus pressure, and congestion for four days. No fever reported. Differential diagnosis includes strep throat due to tenderness and swelling on examination. - Continue Mucinex  for symptomatic relief. -covid strep negative in office, suspect viral  Advised patient on supportive measures:  Be sure to rest, drink plenty of fluids, and use tylenol  or ibuprofen  as needed for pain. Follow up if fever >101, if symptoms worsen or if symptoms are not improved in 3 days. Patient verbalizes understanding.  -rx prednisone  20 mg two tablets once daily x 5 days  Chronic obstructive pulmonary disease (COPD) Chronic obstructive pulmonary disease with current exacerbation, increased wheezing, and  difficulty taking deep breaths. Nebulizer used a couple of times daily for symptom management. Last prednisone  use was a few months ago. - Continue nebulizer treatments as needed.        Return if symptoms worsen or fail to improve.     Ginger Patrick, MSN, APRN, FNP-C [facility]

## 2023-12-17 ENCOUNTER — Telehealth: Payer: Self-pay | Admitting: Family

## 2023-12-17 ENCOUNTER — Encounter: Payer: Self-pay | Admitting: Radiology

## 2023-12-17 NOTE — Telephone Encounter (Signed)
 Copied from CRM [id number]. Topic: Appointments - Scheduling Inquiry for Clinic >> Dec 17, 2023  9:57 AM Avram MATSU wrote: Reason for CRM: patient is calling about getting a TB shot, please advise [phone]

## 2023-12-26 ENCOUNTER — Ambulatory Visit: Admitting: "Endocrinology

## 2024-01-02 ENCOUNTER — Telehealth: Payer: Self-pay

## 2024-01-02 NOTE — Telephone Encounter (Signed)
 CMN received from [facility] regarding pt's CPAP and supplies. This is needed fro a PA from insurance. This only needs a provider signature. I will fax once this has been signed.

## 2024-01-03 DIAGNOSIS — R7689 Other specified abnormal immunological findings in serum: Secondary | ICD-10-CM | POA: Diagnosis not present

## 2024-01-03 DIAGNOSIS — M542 Cervicalgia: Secondary | ICD-10-CM | POA: Diagnosis not present

## 2024-01-03 DIAGNOSIS — F419 Anxiety disorder, unspecified: Secondary | ICD-10-CM | POA: Diagnosis not present

## 2024-01-03 DIAGNOSIS — Z79899 Other long term (current) drug therapy: Secondary | ICD-10-CM | POA: Diagnosis not present

## 2024-01-03 DIAGNOSIS — M5489 Other dorsalgia: Secondary | ICD-10-CM | POA: Diagnosis not present

## 2024-01-03 DIAGNOSIS — J449 Chronic obstructive pulmonary disease, unspecified: Secondary | ICD-10-CM | POA: Diagnosis not present

## 2024-01-03 DIAGNOSIS — E669 Obesity, unspecified: Secondary | ICD-10-CM | POA: Diagnosis not present

## 2024-01-03 DIAGNOSIS — E559 Vitamin D deficiency, unspecified: Secondary | ICD-10-CM | POA: Diagnosis not present

## 2024-01-07 DIAGNOSIS — Z79899 Other long term (current) drug therapy: Secondary | ICD-10-CM | POA: Diagnosis not present

## 2024-01-15 ENCOUNTER — Ambulatory Visit: Admitting: Primary Care

## 2024-01-15 ENCOUNTER — Encounter: Payer: Self-pay | Admitting: Primary Care

## 2024-01-15 VITALS — BP 120/62 | HR 84 | Temp 98.1°F | Ht 64.0 in | Wt 208.4 lb

## 2024-01-15 DIAGNOSIS — J441 Chronic obstructive pulmonary disease with (acute) exacerbation: Secondary | ICD-10-CM | POA: Diagnosis not present

## 2024-01-15 DIAGNOSIS — G4734 Idiopathic sleep related nonobstructive alveolar hypoventilation: Secondary | ICD-10-CM

## 2024-01-15 DIAGNOSIS — F17200 Nicotine dependence, unspecified, uncomplicated: Secondary | ICD-10-CM | POA: Diagnosis not present

## 2024-01-15 DIAGNOSIS — G4733 Obstructive sleep apnea (adult) (pediatric): Secondary | ICD-10-CM | POA: Diagnosis not present

## 2024-01-15 NOTE — Patient Instructions (Signed)
  VISIT SUMMARY: You came in today for a follow-up on your COPD with an asthmatic component. We discussed your recent symptoms, treatments, and progress. You have been experiencing fewer flare-ups since starting Dupixent , and you have not needed prednisone  recently. We also reviewed your use of the CPAP machine and your efforts to quit smoking.  YOUR PLAN: -CHRONIC OBSTRUCTIVE PULMONARY DISEASE WITH ASTHMA: COPD with asthma is a chronic lung condition that makes it hard to breathe due to narrowed airways and inflammation. Your lung function is at 56%, and you are on triple therapy with Breztri , Daliresp , and albuterol . You started Dupixent  to help reduce inflammation, and you have seen some improvement. Continue with your current medications and monitor for any worsening symptoms like colored mucus or increased shortness of breath.  -OBSTRUCTIVE SLEEP APNEA: Obstructive sleep apnea is a condition where your breathing stops and starts during sleep. You recently got a CPAP machine to help with this. Use your CPAP every night to improve your sleep quality and energy levels. Make sure to replace the mask cushion monthly, the frame and tubing every three months, and the filter monthly. Use distilled water in the machine and consider wearing it during the day to get used to it. We will have a virtual follow-up in 30-90 days to check on your progress.  -TOBACCO USE DISORDER: Tobacco use disorder means you are addicted to smoking. Quitting smoking is very important for improving your lung health. Keep trying to cut back and quit smoking. We are here to support you in this effort.  INSTRUCTIONS: We will schedule a virtual follow-up within 30-90 days to assess your CPAP compliance. Continue monitoring your symptoms and contact us  if you notice any worsening. Keep working on quitting smoking, and let us  know if you need additional support.

## 2024-01-15 NOTE — Progress Notes (Signed)
 @Patient  ID: [name], female    DOB: 06/03/1975, 48 y.o.   MRN: [id number]  Chief Complaint  Patient presents with   Obstructive Sleep Apnea    Referring provider: Corwin Antu, FNP  HPI: 48 year old female, current everyday smoker (25 pack year hx). PMH significant for COPD GOLD II, CAP, acute respiratory failure, hypothyroidism, alcohol  dependence, major depression, anemia, tobacco abuse. Patient of Dr. Darlean.  Previous [facility]  07/17/2023 Discussed the use of AI scribe software for clinical note transcription with the patient, who gave verbal consent to proceed.  History of Present Illness   [name] is a 48 year old female with moderate to severe COPD who presents with persistent shortness of breath and fatigue.  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 Her energy levels deplete quickly, often within minutes of starting an activity. Symptoms are manageable when sitting still but worsen with exertion. These issues persist despite previous evaluations and treatments.  She has moderate to severe COPD, with her last pulmonary function test in 2022 showing lung function at 54%, indicating stage 2 to 3 COPD. She continues to smoke and has started using Chantix  to aid in smoking cessation. Her current medications include Symbicort  and Spiriva  inhalers, but her insurance does not cover Breztri . She has not tried Trelegy before. She uses a rescue inhaler, albuterol , and a nebulizer with budesonide  or Pulmicort  up to twice a day as needed for wheezing.  CT chest in March showed borderline cardiomegaly.  Interval decrease in size of enlarged main pulmonary artery.  Interval improvement in bilateral atelectasis/scarring.  Resolved pneumonia right lower lobe. A recent chest x-ray in May showed no acute cardiopulmonary disease. An echocardiogram in May showed an ejection  fraction of 55 to 60%.  Home sleep study on 05/21/2023 showed mild obstructive sleep apnea, AHI 4% 11.8/hour.  SpO2 low 83%.  Patient spent 48 minutes with an oxygen  level less than 88%.  Recommend patient be started on auto CPAP 5 to 15 cm H2O.   She has been on prednisone  previously, which significantly improves her symptoms, although the effects are temporary. She has not been on antibiotics since March.  She is concerned about her ability to return to work due to her current health status and is worried about losing her insurance coverage. She is currently on short-term disability.      Chronic Obstructive Pulmonary Disease (COPD) Moderate to severe COPD with a 7% decline in lung function over the past three years. No exacerbations requiring antibiotics or hospitalization since June. Trelegy is effectively replacing Symbicort  and Spiriva . Daliresp  (Roflumilast ) initiated at 250 mcg, with plans to increase to 500 mcg. Awaiting contact from pulmonary rehab. - Continue Trelegy 200mcg  - Use budesonide  nebulizer as needed for wheezing, up to twice daily. - Use ipratropium albuterol  for dyspnea or chest tightness, up to four times daily. - Increase Daliresp  to 500 mcg to reduce COPD exacerbations. - Ensure pulmonary rehab contacts her. - Discussed smoking cessation as a priority. - Consider biologic therapy with Nucala for recurrent exacerbations if needed in the future   Tobacco Use Disorder Continues smoking despite previous cessation attempts with gum, Chantix , and the patch. Smoking cessation is crucial to prevent further lung function decline. Stress and caregiving responsibilities hinder quitting. - Recommend 'Easy Ways to Quit Smoking' by Dasie Myron. - Emphasize the importance of smoking cessation to prevent further lung function decline.  Sleep  Apnea Sleep apnea. CPAP therapy recommended but cost is a barrier. Frequent nocturnal awakenings, which CPAP may alleviate. - Apply for CPAP  assistance through [email or web address]. - Reassess in three months to evaluate CPAP use and effectiveness.  11/01/2023 Discussed the use of AI scribe software for clinical note transcription with the patient, who gave verbal consent to proceed.  History of Present Illness [name] is a 48 year old female with COPD and sleep apnea who presents for follow-up on her respiratory management. She was referred by her primary care provider for management of her respiratory conditions.  She has been experiencing issues with her breathing, primarily related to her COPD. Symptoms include wheezing and a small amount of yellow mucus production, though not as severe as during previous exacerbations. She has had approximately six to seven exacerbations requiring prednisone  since January, which provides the best relief for her breathing. She has a spacer for her inhaler. She is currently taking Daliresp  at 500 micrograms.  She has sleep apnea and has not yet received a CPAP machine due to financial constraints, including a $500 copayment and lack of hardship paperwork. She has applied for Medicaid to help with insurance coverage. She is exploring alternative options for obtaining a CPAP machine, such as second-hand sources.  In terms of medication, she is currently taking Breztri  and Daliresp . She has not been taking Mucinex  recently but did use it in July.  No significant chest tightness or severe mucus production as seen in previous flare-ups.   1. 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, still smoking (Primary)  2. Hypoxia, sleep related  3. Mild obstructive sleep apnea  Assessment and Plan Assessment & Plan Chronic obstructive pulmonary disease (COPD) with frequent exacerbations COPD with frequent exacerbations, approximately six to seven flare-ups requiring prednisone  since January. Current symptoms include wheezing and mild yellow sputum production. Breztri  has been initiated to  replace Trelegy as the maintenance inhaler. Consideration of Nucala, a biologic injection, due to elevated eosinophils and frequent exacerbations. Nucala is indicated for patients with more than two or three flare-ups or one hospitalization per year. She responds well to prednisone , indicating potential benefit from Nucala. - Instruct to use Breztri  twice daily, replacing Trelegy. - Advise to use Pulmicort  as needed for wheezing. - Continue Daliresp  at 500 micrograms daily. - Prescribe a prednisone  taper for current exacerbation. - Recommend Robitussin or Mucinex  for 5-7 days to loosen chest congestion. - Schedule follow-up appointment in two weeks to discuss potential initiation of Dupixent , Nucala or Tezspire   Obstructive sleep apnea Obstructive sleep apnea with difficulty obtaining CPAP due to financial constraints. She has not received the CPAP machine due to a $500 copayment and lack of hardship paperwork. Medicaid application is in process, which may provide better coverage for CPAP therapy. Alternative options include applying for patient assistance and exploring second-hand CPAP machines. - Provide information on applying for patient assistance for CPAP through [email or web address]. - Advise to apply for Medicaid and notify if coverage is obtained to resubmit CPAP order. - Suggest exploring second-hand CPAP machines from family or online sources. - Recommend side sleeping position or use of a wedge pillow to reduce apneic events. -Continue to wear supplemental oxygen  at night while sleeping    11/20/2023-Interim hx  Discussed the use of AI scribe software for clinical note transcription with the patient, who gave verbal consent to proceed.  History of Present Illness [name] is a 48 year old female with moderate-severe COPD with asthma who presents for  discussion of add-on treatments.  She is currently on triple therapy with Breztri  and uses albuterol  a couple of times a day.  Her symptoms are most severe in the early morning and late night. She was last on prednisone  on November 01, 2023, which improved her symptoms. She is also on Daliresp  (Roflumilast ) for her COPD. She continues to smoke. She received a flu shot this year but is due for a pneumonia vaccine.   She has recently been approved for Medicaid and is in the process of obtaining her own CPAP machine, which she plans to use nightly for at least four hours.  She feels stressed about her smoking habit and has previously quit for a year. She is considering going back to school to find less strenuous work.    01/15/2024- interim hx Discussed the use of AI scribe software for clinical note transcription with the patient, who gave verbal consent to proceed.  History of Present Illness [name] is a 48 year old female with stage 2 to 3 COPD with an asthmatic component who presents for follow-up of her condition.  She has been experiencing frequent exacerbations of her COPD with an asthmatic component. Her lung function is at 56%. Despite being on triple therapy with Breztri , Daliresp , and albuterol , she has had frequent flare-ups requiring prednisone  several times a month. She started on Dupixent  300 mg every two weeks in October/November and is coming up on her third injection. She notes some improvement, as she has not needed prednisone  in several weeks.  She received CPAP machine today from [place]. She uses oxygen  at night and when sick during the day, but has not needed it in the past few weeks. She requires new tubing and a mask for her nebulizer and oxygen  setup, which she uses as needed. She continues to smoke but is attempting to quit.  In the past four weeks, asthma has limited her activities 'some of the time' and she experiences shortness of breath 'three to six times a week'. Asthma symptoms wake her up 'four or more nights a week', and she uses her rescue inhaler 'one to two  times a day'. She rates her asthma control as 'somewhat controlled'. Nocturnal symptom likely more related to untreated OSA.   Regarding her COPD symptoms, she rates her cough as a 'three', chest tightness as a 'two', and breathlessness when walking up a hill as a 'four'. She feels limited in activities at home, rating it a 'three', and reports poor sleep due to her lung condition, rating it a 'four'. Her energy level is low, rated as a 'five'.  She is currently on Breztri , two puffs twice a day, Daliresp , and albuterol  as needed. She has insurance coverage for her medications.  Allergies  Allergen Reactions   Chantix  [Varenicline ] Other (See Comments)    weird dreams' and nauseated    Immunization History  Administered Date(s) Administered   Influenza Inj Mdck Quad With Preservative 09/26/2021   Influenza, Seasonal, Injecte, Preservative Fre 10/30/2023   Influenza,inj,Quad PF,6+ Mos 11/12/2014, 02/13/2016, 12/07/2016, 02/19/2020   Janssen (J&J) SARS-COV-2 Vaccination 07/21/2019   PNEUMOCOCCAL CONJUGATE-20 11/27/2023    Past Medical History:  Diagnosis Date   Alcohol  abuse    Anemia 01/31/2011   Asthma    COPD (chronic obstructive pulmonary disease) (HCC)    Depression    Hepatitis C 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 10/24/2021   RNA negative.  Hep C false positive.     Hypoxia 02/01/2011   Medical history non-contributory  Needs sleep apnea assessment 2013.07.31   Tobacco abuse    Vertigo 04/2023    Tobacco History: Social History   Tobacco Use  Smoking Status Every Day   Current packs/day: 0.00   Average packs/day: 0.5 packs/day for 25.0 years (12.5 ttl pk-yrs)   Types: Cigarettes   Start date: 11/11/1996   Last attempt to quit: 11/11/2021   Years since quitting: 2.1   Passive exposure: Current  Smokeless Tobacco Never  Tobacco Comments   Pt smokes 0.5 ppd. AB, CMA 09-07-23          1 pack a day. Trying to quit started using chantix .  05/11/2023   Ready to quit: Not  Answered Counseling given: Not Answered Tobacco comments: Pt smokes 0.5 ppd. AB, CMA 09-07-23    1 pack a day. Trying to quit started using chantix .  05/11/2023   Outpatient Medications Prior to Visit  Medication Sig Dispense Refill   albuterol  (VENTOLIN  HFA) 108 (90 Base) MCG/ACT inhaler Inhale 2 puffs into the lungs every 4 (four) hours as needed. 18 g 2   ALPRAZolam  (XANAX ) 0.25 MG tablet Take 1 tablet (0.25 mg total) by mouth 2 (two) times daily as needed for anxiety or sleep.     azelastine  (ASTELIN ) 0.1 % nasal spray Place 1 spray into both nostrils 2 (two) times daily. Use in each nostril as directed 30 mL 1   budesonide  (PULMICORT ) 0.5 MG/2ML nebulizer solution Take 2 mLs (0.5 mg total) by nebulization 2 (two) times daily as needed (Wheezing). 60 mL 0   budesonide -glycopyrrolate-formoterol  (BREZTRI  AEROSPHERE) 160-9-4.8 MCG/ACT AERO inhaler Inhale 2 puffs into the lungs 2 (two) times daily. 10.7 g 11   Dupilumab  (DUPIXENT ) 300 MG/2ML SOAJ Inject 300 mg into the skin every 14 (fourteen) days. 4 mL 3   FLUoxetine  (PROZAC ) 40 MG capsule Take 1 capsule (40 mg total) by mouth daily. 90 capsule 3   gabapentin  (NEURONTIN ) 300 MG capsule TAKE 1 CAPSULE BY MOUTH ONCE DAILY AS NEEDED FOR PAIN 90 capsule 0   meclizine  (ANTIVERT ) 25 MG tablet TAKE 1/2 TO 1 (ONE-HALF TO ONE) TABLET BY MOUTH ONCE DAILY AS NEEDED FOR VERTIGO 30 tablet 0   meloxicam  (MOBIC ) 7.5 MG tablet Take 1 tablet (7.5 mg total) by mouth 2 (two) times daily. 180 tablet 0   Nebulizer System All-In-One MISC 1 Device by Does not apply route daily as needed. 1 each 0   oxyCODONE -acetaminophen  (PERCOCET) 10-325 MG tablet Take 1 tablet by mouth 4 (four) times daily as needed.     roflumilast  (DALIRESP ) 500 MCG TABS tablet Take 1 tablet (500 mcg total) by mouth daily. 30 tablet 11   SYNTHROID  200 MCG tablet Take one tablet 200 mcg along with one 25 mcg tablet once daily (total 225 mcg once daily) 30 tablet 1   SYNTHROID  25 MCG tablet  Take 1 25 mcg tablet along with one 200 mcg tablet once daily for total of 225 mcg once daily 30 tablet 1   Vitamin D , Ergocalciferol , (DRISDOL ) 1.25 MG (50000 UNIT) CAPS capsule Take 50,000 Units by mouth once a week.     budesonide -glycopyrrolate-formoterol  (BREZTRI  AEROSPHERE) 160-9-4.8 MCG/ACT AERO inhaler Inhale 2 puffs into the lungs in the morning and at bedtime. (Patient not taking: Reported on 01/15/2024)     predniSONE  (DELTASONE ) 20 MG tablet Take two tablets once daily for five days (Patient not taking: Reported on 01/15/2024) 10 tablet 0   Facility-Administered Medications Prior to Visit  Medication Dose Route Frequency Provider Last Rate  Last Admin   ipratropium-albuterol  (DUONEB) 0.5-2.5 (3) MG/3ML nebulizer solution 3 mL  3 mL Nebulization Q6H Hope Kari ORN, NP   3 mL at 11/17/21 1357    Review of Systems  Review of Systems  Constitutional: Negative.   HENT: Negative.    Respiratory: Negative.    Cardiovascular: Negative.    Physical Exam  BP 120/62   Pulse 84   Temp 98.1 F (36.7 C)   Ht 5' 4 (1.626 m) Comment: pt stated  Wt 208 lb 6.4 oz (94.5 kg)   LMP 08/22/2022 (Approximate)   SpO2 96% Comment: ra  BMI 35.77 kg/m  Physical Exam Constitutional:      General: She is not in acute distress.    Appearance: Normal appearance. She is not ill-appearing.  HENT:     Mouth/Throat:     Mouth: Mucous membranes are moist.     Pharynx: Oropharynx is clear.  Cardiovascular:     Rate and Rhythm: Normal rate and regular rhythm.  Pulmonary:     Effort: Pulmonary effort is normal. No respiratory distress.     Breath sounds: Wheezing present. No rhonchi or rales.  Musculoskeletal:        General: Normal range of motion.  Skin:    General: Skin is warm and dry.  Neurological:     General: No focal deficit present.     Mental Status: She is alert and oriented to person, place, and time. Mental status is at baseline.  Psychiatric:        Mood and Affect: Mood  normal.        Behavior: Behavior normal.        Thought Content: Thought content normal.        Judgment: Judgment normal.     Lab Results:  CBC    Component Value Date/Time   WBC 6.5 10/29/2023 1341   RBC 4.62 10/29/2023 1341   HGB 12.3 10/29/2023 1341   HGB 11.6 (L) 05/13/2013 0449   HCT 38.8 10/29/2023 1341   HCT 36.2 05/13/2013 0449   PLT 319.0 10/29/2023 1341   PLT 264 05/13/2013 0449   MCV 84.0 10/29/2023 1341   MCV 82 05/13/2013 0449   MCH 26.1 05/07/2023 1125   MCHC 31.6 10/29/2023 1341   RDW 15.7 (H) 10/29/2023 1341   RDW 15.3 (H) 05/13/2013 0449   LYMPHSABS 1.8 10/29/2023 1341   LYMPHSABS 2.4 05/13/2013 0449   MONOABS 0.5 10/29/2023 1341   MONOABS 0.9 05/13/2013 0449   EOSABS 0.3 10/29/2023 1341   EOSABS 0.0 05/13/2013 0449   BASOSABS 0.1 10/29/2023 1341   BASOSABS 0.0 05/13/2013 0449    BMET    Component Value Date/Time   NA 140 07/06/2023 0917   NA 136 05/11/2013 0341   K 4.3 07/06/2023 0917   K 4.1 05/11/2013 0341   CL 104 07/06/2023 0917   CL 106 05/11/2013 0341   CO2 29 07/06/2023 0917   CO2 27 05/11/2013 0341   GLUCOSE 88 07/06/2023 0917   GLUCOSE 143 (H) 05/11/2013 0341   BUN 15 07/06/2023 0917   BUN 12 05/11/2013 0341   CREATININE 0.68 07/06/2023 0917   CREATININE 0.71 05/15/2013 0629   CALCIUM 9.4 07/06/2023 0917   CALCIUM 8.7 05/11/2013 0341   GFRNONAA >60 05/07/2023 1125   GFRNONAA >60 05/15/2013 0629   GFRAA >60 10/25/2019 0739   GFRAA >60 05/15/2013 0629    BNP    Component Value Date/Time   BNP 9.0 04/10/2022 9096  ProBNP    Component Value Date/Time   PROBNP 20.0 07/06/2023 0917    Imaging: No results found.   Assessment & Plan:   1. COPD exacerbation (HCC) (Primary) - Ambulatory Referral for DME  2. Moderate obstructive sleep apnea  3. Hypoxia, sleep related  Assessment and Plan Assessment & Plan Chronic obstructive pulmonary disease with asthma Stage 2-3 COPD with asthma, lung function at 56%.  Frequent exacerbations with elevated eosinophil count. Currently on triple therapy with Breztri , Daliresp , and albuterol . Started Dupixent  in October/November. Improvement noted with reduced prednisone  use. ACT score is 12, indicating somewhat controlled asthma. CAT score is 20, indicating moderate symptoms. Wheezing present, but not actively flaring today. Improvement expected with continued Dupixent  use, potentially taking up to 12 weeks for full effect. - Continue Breztri , Daliresp , and albuterol . - Continue Dupixent  300 mg every two weeks. - Provided DME order for nebulizer kit, filters, tubing, mask, and nasal cannula tubing for oxygen . - Monitor for colored, purulent mucus or increased shortness of breath not relieved by nebulizer.  Obstructive sleep apnea Obtained CPAP machine today form [name]. She was fitted for hybrid mask. Educated on CPAP usage and maintenance. Expectation is to wear CPAP every night to improve sleep quality and reduce nocturnal symptoms. Compliance expected to be 70% usage for more than four hours per night. Benefits include more restful sleep and increased energy levels. - Use CPAP every night 4-6 hours. - Replace CPAP mask cushion monthly, frame every three months, tubing every three months, and filter monthly. - Use distilled water in CPAP machine. - Will schedule virtual follow-up within 30-90 days to assess CPAP compliance.  Tobacco use disorder Continues to smoke but is attempting to cut back and quit. Smoking cessation is crucial for lung health improvement. - Encouraged smoking cessation.    Kari LELON Ferrari, NP 01/15/2024

## 2024-02-01 DIAGNOSIS — F419 Anxiety disorder, unspecified: Secondary | ICD-10-CM | POA: Diagnosis not present

## 2024-02-01 DIAGNOSIS — M542 Cervicalgia: Secondary | ICD-10-CM | POA: Diagnosis not present

## 2024-02-01 DIAGNOSIS — J449 Chronic obstructive pulmonary disease, unspecified: Secondary | ICD-10-CM | POA: Diagnosis not present

## 2024-02-01 DIAGNOSIS — E669 Obesity, unspecified: Secondary | ICD-10-CM | POA: Diagnosis not present

## 2024-02-01 DIAGNOSIS — E559 Vitamin D deficiency, unspecified: Secondary | ICD-10-CM | POA: Diagnosis not present

## 2024-02-01 DIAGNOSIS — Z79899 Other long term (current) drug therapy: Secondary | ICD-10-CM | POA: Diagnosis not present

## 2024-02-01 DIAGNOSIS — M5489 Other dorsalgia: Secondary | ICD-10-CM | POA: Diagnosis not present

## 2024-02-01 DIAGNOSIS — R7689 Other specified abnormal immunological findings in serum: Secondary | ICD-10-CM | POA: Diagnosis not present

## 2024-02-05 DIAGNOSIS — Z79899 Other long term (current) drug therapy: Secondary | ICD-10-CM | POA: Diagnosis not present

## 2024-02-11 ENCOUNTER — Ambulatory Visit: Admitting: "Endocrinology

## 2024-02-26 ENCOUNTER — Other Ambulatory Visit: Payer: Self-pay | Admitting: Family

## 2024-02-26 ENCOUNTER — Other Ambulatory Visit (HOSPITAL_COMMUNITY): Payer: Self-pay

## 2024-02-26 ENCOUNTER — Telehealth: Payer: Self-pay

## 2024-02-26 DIAGNOSIS — M1990 Unspecified osteoarthritis, unspecified site: Secondary | ICD-10-CM

## 2024-02-26 DIAGNOSIS — G8929 Other chronic pain: Secondary | ICD-10-CM

## 2024-02-26 NOTE — Telephone Encounter (Signed)
 Pharmacy Patient Advocate Encounter   Received notification from [facility] that prior authorization for Breztri  Aerosphere is required/requested.   Insurance verification completed.   The patient is insured through [facility] MEDICAID.   Per test claim: PA required; PA submitted to above mentioned insurance via [employer] Key/confirmation #/EOC [id number] Status is pending

## 2024-02-26 NOTE — Telephone Encounter (Signed)
 Pharmacy Patient Advocate Encounter  Received notification from [facility] MEDICAID that Prior Authorization for Breztri  Aerosphere has been APPROVED from 02/26/24 to 02/25/25. Ran test claim, Copay is $4.00. This test claim was processed through [facility]- copay amounts may vary at other pharmacies due to pharmacy/plan contracts, or as the patient moves through the different stages of their insurance plan.   PA #/Case ID/Reference #: # [id number]

## 2024-02-27 NOTE — Telephone Encounter (Signed)
 NOTED

## 2024-03-11 ENCOUNTER — Other Ambulatory Visit: Payer: Self-pay

## 2024-03-11 DIAGNOSIS — J449 Chronic obstructive pulmonary disease, unspecified: Secondary | ICD-10-CM

## 2024-03-11 MED ORDER — ALBUTEROL SULFATE HFA 108 (90 BASE) MCG/ACT IN AERS: 2.0000 | INHALATION_SPRAY | RESPIRATORY_TRACT | 2 refills | Status: AC | PRN

## 2024-03-20 ENCOUNTER — Telehealth: Payer: Self-pay

## 2024-03-20 NOTE — Telephone Encounter (Signed)
 I called [address] and spoke to [place], [place]. I informed Apolinar that I needed a compliance report for pt's CPAP machine. Apolinar stated that our office is tagged in [place]. I informed Apolinar multiple times that we could not pull pt in [place] for Compliance. Apolinar did state she would go ahead and fax over a copy. NFN

## 2024-03-21 ENCOUNTER — Telehealth: Payer: Self-pay | Admitting: Primary Care

## 2024-03-21 ENCOUNTER — Telehealth: Admitting: Primary Care

## 2024-03-21 DIAGNOSIS — G4734 Idiopathic sleep related nonobstructive alveolar hypoventilation: Secondary | ICD-10-CM

## 2024-03-21 DIAGNOSIS — J449 Chronic obstructive pulmonary disease, unspecified: Secondary | ICD-10-CM

## 2024-03-21 DIAGNOSIS — G4733 Obstructive sleep apnea (adult) (pediatric): Secondary | ICD-10-CM

## 2024-03-21 NOTE — Progress Notes (Unsigned)
 Virtual Visit via Video Note  I connected with [name] on 03/21/24 at  2:30 PM EST by a video enabled telemedicine application and verified that I am speaking with the correct person using two identifiers.  Location: Patient: *** Provider: ***   I discussed the limitations of evaluation and management by telemedicine and the availability of in person appointments. The patient expressed understanding and agreed to proceed.  History of Present Illness:  49 year old female, current everyday smoker (25 pack year hx). PMH significant for COPD GOLD II, CAP, acute respiratory failure, hypothyroidism, alcohol  dependence, major depression, anemia, tobacco abuse. Patient of Dr. Darlean.  Previous [facility]  07/17/2023 Discussed the use of AI scribe software for clinical note transcription with the patient, who gave verbal consent to proceed.  History of Present Illness   [name] is a 49 year old female with moderate to severe COPD who presents with persistent shortness of breath and fatigue.  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 Her energy levels deplete quickly, often within minutes of starting an activity. Symptoms are manageable when sitting still but worsen with exertion. These issues persist despite previous evaluations and treatments.  She has moderate to severe COPD, with her last pulmonary function test in 2022 showing lung function at 54%, indicating stage 2 to 3 COPD. She continues to smoke and has started using Chantix  to aid in smoking cessation. Her current medications include Symbicort  and Spiriva  inhalers, but her insurance does not cover Breztri . She has not tried Trelegy before. She uses a rescue inhaler, albuterol , and a nebulizer with budesonide  or Pulmicort  up to twice a day as needed for wheezing.  CT chest in March showed borderline cardiomegaly.   Interval decrease in size of enlarged main pulmonary artery.  Interval improvement in bilateral atelectasis/scarring.  Resolved pneumonia right lower lobe. A recent chest x-ray in May showed no acute cardiopulmonary disease. An echocardiogram in May showed an ejection fraction of 55 to 60%.  Home sleep study on 05/21/2023 showed mild obstructive sleep apnea, AHI 4% 11.8/hour.  SpO2 low 83%.  Patient spent 48 minutes with an oxygen  level less than 88%.  Recommend patient be started on auto CPAP 5 to 15 cm H2O.   She has been on prednisone  previously, which significantly improves her symptoms, although the effects are temporary. She has not been on antibiotics since March.  She is concerned about her ability to return to work due to her current health status and is worried about losing her insurance coverage. She is currently on short-term disability.      Chronic Obstructive Pulmonary Disease (COPD) Moderate to severe COPD with a 7% decline in lung function over the past three years. No exacerbations requiring antibiotics or hospitalization since June. Trelegy is effectively replacing Symbicort  and Spiriva . Daliresp  (Roflumilast ) initiated at 250 mcg, with plans to increase to 500 mcg. Awaiting contact from pulmonary rehab. - Continue Trelegy 200mcg  - Use budesonide  nebulizer as needed for wheezing, up to twice daily. - Use ipratropium albuterol  for dyspnea or chest tightness, up to four times daily. - Increase Daliresp  to 500 mcg to reduce COPD exacerbations. - Ensure pulmonary rehab contacts her. - Discussed smoking cessation as a priority. - Consider biologic therapy with Nucala for recurrent exacerbations if needed in the future   Tobacco Use Disorder Continues smoking despite previous cessation attempts with gum, Chantix , and the patch. Smoking cessation is crucial  to prevent further lung function decline. Stress and caregiving responsibilities hinder quitting. - Recommend 'Easy Ways to Quit  Smoking' by Dasie Myron. - Emphasize the importance of smoking cessation to prevent further lung function decline.  Sleep Apnea Sleep apnea. CPAP therapy recommended but cost is a barrier. Frequent nocturnal awakenings, which CPAP may alleviate. - Apply for CPAP assistance through [email or web address]. - Reassess in three months to evaluate CPAP use and effectiveness.  11/01/2023 Discussed the use of AI scribe software for clinical note transcription with the patient, who gave verbal consent to proceed.  History of Present Illness [name] is a 49 year old female with COPD and sleep apnea who presents for follow-up on her respiratory management. She was referred by her primary care provider for management of her respiratory conditions.  She has been experiencing issues with her breathing, primarily related to her COPD. Symptoms include wheezing and a small amount of yellow mucus production, though not as severe as during previous exacerbations. She has had approximately six to seven exacerbations requiring prednisone  since January, which provides the best relief for her breathing. She has a spacer for her inhaler. She is currently taking Daliresp  at 500 micrograms.  She has sleep apnea and has not yet received a CPAP machine due to financial constraints, including a $500 copayment and lack of hardship paperwork. She has applied for Medicaid to help with insurance coverage. She is exploring alternative options for obtaining a CPAP machine, such as second-hand sources.  In terms of medication, she is currently taking Breztri  and Daliresp . She has not been taking Mucinex  recently but did use it in July.  No significant chest tightness or severe mucus production as seen in previous flare-ups.   1. 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, still smoking (Primary)  2. Hypoxia, sleep related  3. Mild obstructive sleep apnea  Assessment and Plan Assessment & Plan Chronic  obstructive pulmonary disease (COPD) with frequent exacerbations COPD with frequent exacerbations, approximately six to seven flare-ups requiring prednisone  since January. Current symptoms include wheezing and mild yellow sputum production. Breztri  has been initiated to replace Trelegy as the maintenance inhaler. Consideration of Nucala, a biologic injection, due to elevated eosinophils and frequent exacerbations. Nucala is indicated for patients with more than two or three flare-ups or one hospitalization per year. She responds well to prednisone , indicating potential benefit from Nucala. - Instruct to use Breztri  twice daily, replacing Trelegy. - Advise to use Pulmicort  as needed for wheezing. - Continue Daliresp  at 500 micrograms daily. - Prescribe a prednisone  taper for current exacerbation. - Recommend Robitussin or Mucinex  for 5-7 days to loosen chest congestion. - Schedule follow-up appointment in two weeks to discuss potential initiation of Dupixent , Nucala or Tezspire   Obstructive sleep apnea Obstructive sleep apnea with difficulty obtaining CPAP due to financial constraints. She has not received the CPAP machine due to a $500 copayment and lack of hardship paperwork. Medicaid application is in process, which may provide better coverage for CPAP therapy. Alternative options include applying for patient assistance and exploring second-hand CPAP machines. - Provide information on applying for patient assistance for CPAP through [email or web address]. - Advise to apply for Medicaid and notify if coverage is obtained to resubmit CPAP order. - Suggest exploring second-hand CPAP machines from family or online sources. - Recommend side sleeping position or use of a wedge pillow to reduce apneic events. -Continue to wear supplemental oxygen  at night while sleeping    11/20/2023-Interim hx  Discussed the use of AI  scribe software for clinical note transcription with the patient, who gave verbal consent  to proceed.  History of Present Illness [name] is a 49 year old female with moderate-severe COPD with asthma who presents for discussion of add-on treatments.  She is currently on triple therapy with Breztri  and uses albuterol  a couple of times a day. Her symptoms are most severe in the early morning and late night. She was last on prednisone  on November 01, 2023, which improved her symptoms. She is also on Daliresp  (Roflumilast ) for her COPD. She continues to smoke. She received a flu shot this year but is due for a pneumonia vaccine.   She has recently been approved for Medicaid and is in the process of obtaining her own CPAP machine, which she plans to use nightly for at least four hours.  She feels stressed about her smoking habit and has previously quit for a year. She is considering going back to school to find less strenuous work.    01/15/2024- interim hx Discussed the use of AI scribe software for clinical note transcription with the patient, who gave verbal consent to proceed.  History of Present Illness [name] is a 49 year old female with stage 2 to 3 COPD with an asthmatic component who presents for follow-up of her condition.  She has been experiencing frequent exacerbations of her COPD with an asthmatic component. Her lung function is at 56%. Despite being on triple therapy with Breztri , Daliresp , and albuterol , she has had frequent flare-ups requiring prednisone  several times a month. She started on Dupixent  300 mg every two weeks in October/November and is coming up on her third injection. She notes some improvement, as she has not needed prednisone  in several weeks.  She received CPAP machine today from [place]. She uses oxygen  at night and when sick during the day, but has not needed it in the past few weeks. She requires new tubing and a mask for her nebulizer and oxygen  setup, which she uses as needed. She continues to smoke but is  attempting to quit.  In the past four weeks, asthma has limited her activities 'some of the time' and she experiences shortness of breath 'three to six times a week'. Asthma symptoms wake her up 'four or more nights a week', and she uses her rescue inhaler 'one to two times a day'. She rates her asthma control as 'somewhat controlled'. Nocturnal symptom likely more related to untreated OSA.   Regarding her COPD symptoms, she rates her cough as a 'three', chest tightness as a 'two', and breathlessness when walking up a hill as a 'four'. She feels limited in activities at home, rating it a 'three', and reports poor sleep due to her lung condition, rating it a 'four'. Her energy level is low, rated as a 'five'.  She is currently on Breztri , two puffs twice a day, Daliresp , and albuterol  as needed. She has insurance coverage for her medications.   1. COPD exacerbation (HCC) (Primary) - Ambulatory Referral for DME  2. Moderate obstructive sleep apnea  3. Hypoxia, sleep related  Assessment and Plan Assessment & Plan Chronic obstructive pulmonary disease with asthma Stage 2-3 COPD with asthma, lung function at 56%. Frequent exacerbations with elevated eosinophil count. Currently on triple therapy with Breztri , Daliresp , and albuterol . Started Dupixent  in October/November. Improvement noted with reduced prednisone  use. ACT score is 12, indicating somewhat controlled asthma. CAT score is 20, indicating moderate symptoms. Wheezing present, but not actively flaring today.  Improvement expected with continued Dupixent  use, potentially taking up to 12 weeks for full effect. - Continue Breztri , Daliresp , and albuterol . - Continue Dupixent  300 mg every two weeks. - Provided DME order for nebulizer kit, filters, tubing, mask, and nasal cannula tubing for oxygen . - Monitor for colored, purulent mucus or increased shortness of breath not relieved by nebulizer.  Obstructive sleep apnea Obtained CPAP machine  today form [name]. She was fitted for hybrid mask. Educated on CPAP usage and maintenance. Expectation is to wear CPAP every night to improve sleep quality and reduce nocturnal symptoms. Compliance expected to be 70% usage for more than four hours per night. Benefits include more restful sleep and increased energy levels. - Use CPAP every night 4-6 hours. - Replace CPAP mask cushion monthly, frame every three months, tubing every three months, and filter monthly. - Use distilled water in CPAP machine. - Will schedule virtual follow-up within 30-90 days to assess CPAP compliance.  Tobacco use disorder Continues to smoke but is attempting to cut back and quit. Smoking cessation is crucial for lung health improvement. - Encouraged smoking cessation.    03/21/2024  Discussed the use of AI scribe software for clinical note transcription with the patient, who gave verbal consent to proceed.  History of Present Illness [name] is a 49 year old female with stage 2 to 3 COPD and asthma who presents for a follow-up visit.  Since her last visit in December, she has experienced a reduction in flare-ups and improved symptoms. She has not required antibiotics or prednisone  since then. Her current medications include Breztri  inhaler, Daliresp  (roflumilast ), and albuterol  as needed. She started Dupixent  in [place] and has noticed improvement, with a reduction in prednisone  use.  Her asthma symptoms have been less frequent, with shortness of breath occurring once or twice a week. Albuterol  use has reduced to two to four times per week. She feels her asthma is well controlled. She experiences a cough in the early morning and late at night, with less mucus production than before. The mucus is sometimes present but not discolored or with a bad taste.  She uses a CPAP machine and reports waking up only once a night now, compared to five or six times previously. However, she has not  used the CPAP every night due to power outages and forgetting it during travel. She notices a benefit when using it.  Asthma symptoms have woken her up about once a week in the past four weeks. She experiences shortness of breath once or twice a week and uses her albuterol  inhaler two to four times per week. She has a cough in the morning and at night with reduced mucus production.       Observations/Objective:  Appears well without overt respiratory symptoms  Assessment and Plan:  Assessment and Plan Assessment & Plan Chronic obstructive pulmonary disease with asthma Stage 2 to 3 COPD with asthma, lung function at 56%. No exacerbations since December. Reduced need for prednisone  and albuterol . Improvement with Dupixent . Morning and late-night cough with reduced mucus production. Asthma control rated as well controlled. - Continue Breztri  inhaler, Daliresp , and albuterol  as needed. - Continue Dupixent  injections every 14 days.  Obstructive sleep apnea CPAP usage at 58% over the last two months, affected by power outages and travel. Apnea score shows 9 events per hour, likely due to air leaks from mask issues. Average pressure is 10, max is 13.6, with auto range set to 5-15. - Change CPAP mask and  ensure proper seal. - Change CPAP tubing and filter regularly. - Wash CPAP machine weekly. - Encouraged consistent nightly CPAP use.  Recording duration: 10 minutes   Follow Up Instructions:    I discussed the assessment and treatment plan with the patient. The patient was provided an opportunity to ask questions and all were answered. The patient agreed with the plan and demonstrated an understanding of the instructions.   The patient was advised to call back or seek an in-person evaluation if the symptoms worsen or if the condition fails to improve as anticipated.  I provided *** minutes of non-face-to-face time during this encounter.   Kari LELON Ferrari, NP

## 2024-03-21 NOTE — Telephone Encounter (Signed)
 Needs 6 month fu with Beth NP for COPD/OSA

## 2024-04-03 ENCOUNTER — Ambulatory Visit: Admitting: "Endocrinology
